# Patient Record
Sex: Female | Born: 1950 | Race: White | Hispanic: No | Smoking: Former smoker
Health system: Southern US, Community
[De-identification: ages and names within clinical notes are randomized; demographics above are authoritative.]

## PROBLEM LIST (undated history)

## (undated) DIAGNOSIS — K219 Gastro-esophageal reflux disease without esophagitis: Secondary | ICD-10-CM

## (undated) DIAGNOSIS — J42 Unspecified chronic bronchitis: Secondary | ICD-10-CM

## (undated) DIAGNOSIS — M797 Fibromyalgia: Secondary | ICD-10-CM

## (undated) DIAGNOSIS — J9610 Chronic respiratory failure, unspecified whether with hypoxia or hypercapnia: Secondary | ICD-10-CM

## (undated) DIAGNOSIS — J45909 Unspecified asthma, uncomplicated: Secondary | ICD-10-CM

## (undated) DIAGNOSIS — M1990 Unspecified osteoarthritis, unspecified site: Secondary | ICD-10-CM

## (undated) DIAGNOSIS — D638 Anemia in other chronic diseases classified elsewhere: Secondary | ICD-10-CM

## (undated) DIAGNOSIS — G8929 Other chronic pain: Secondary | ICD-10-CM

## (undated) DIAGNOSIS — J939 Pneumothorax, unspecified: Secondary | ICD-10-CM

## (undated) DIAGNOSIS — Z9981 Dependence on supplemental oxygen: Secondary | ICD-10-CM

## (undated) DIAGNOSIS — Z201 Contact with and (suspected) exposure to tuberculosis: Secondary | ICD-10-CM

## (undated) DIAGNOSIS — I10 Essential (primary) hypertension: Secondary | ICD-10-CM

## (undated) DIAGNOSIS — I4892 Unspecified atrial flutter: Secondary | ICD-10-CM

## (undated) DIAGNOSIS — J449 Chronic obstructive pulmonary disease, unspecified: Secondary | ICD-10-CM

## (undated) DIAGNOSIS — R519 Headache, unspecified: Secondary | ICD-10-CM

## (undated) DIAGNOSIS — E875 Hyperkalemia: Secondary | ICD-10-CM

## (undated) DIAGNOSIS — J189 Pneumonia, unspecified organism: Secondary | ICD-10-CM

## (undated) DIAGNOSIS — C259 Malignant neoplasm of pancreas, unspecified: Secondary | ICD-10-CM

## (undated) DIAGNOSIS — M25551 Pain in right hip: Secondary | ICD-10-CM

## (undated) DIAGNOSIS — I480 Paroxysmal atrial fibrillation: Secondary | ICD-10-CM

## (undated) DIAGNOSIS — R51 Headache: Secondary | ICD-10-CM

## (undated) DIAGNOSIS — R011 Cardiac murmur, unspecified: Secondary | ICD-10-CM

## (undated) DIAGNOSIS — I82409 Acute embolism and thrombosis of unspecified deep veins of unspecified lower extremity: Secondary | ICD-10-CM

## (undated) HISTORY — DX: Anemia in other chronic diseases classified elsewhere: D63.8

## (undated) HISTORY — PX: TUBAL LIGATION: SHX77

## (undated) HISTORY — DX: Malignant neoplasm of pancreas, unspecified: C25.9

## (undated) HISTORY — DX: Paroxysmal atrial fibrillation: I48.0

## (undated) HISTORY — PX: TONSILLECTOMY: SUR1361

## (undated) HISTORY — DX: Unspecified atrial flutter: I48.92

## (undated) HISTORY — PX: ANTERIOR CERVICAL DECOMP/DISCECTOMY FUSION: SHX1161

## (undated) HISTORY — DX: Chronic respiratory failure, unspecified whether with hypoxia or hypercapnia: J96.10

## (undated) HISTORY — DX: Hyperkalemia: E87.5

## (undated) HISTORY — DX: Acute embolism and thrombosis of unspecified deep veins of unspecified lower extremity: I82.409

---

## 2000-03-25 ENCOUNTER — Other Ambulatory Visit: Admission: RE | Admit: 2000-03-25 | Discharge: 2000-03-25 | Payer: Self-pay | Admitting: Obstetrics and Gynecology

## 2000-05-05 ENCOUNTER — Encounter: Admission: RE | Admit: 2000-05-05 | Discharge: 2000-05-05 | Payer: Self-pay | Admitting: *Deleted

## 2000-05-05 ENCOUNTER — Encounter: Payer: Self-pay | Admitting: *Deleted

## 2000-09-15 ENCOUNTER — Emergency Department (HOSPITAL_COMMUNITY): Admission: EM | Admit: 2000-09-15 | Discharge: 2000-09-15 | Payer: Self-pay | Admitting: Emergency Medicine

## 2000-11-16 ENCOUNTER — Encounter: Payer: Self-pay | Admitting: Neurosurgery

## 2000-11-16 ENCOUNTER — Ambulatory Visit (HOSPITAL_COMMUNITY): Admission: RE | Admit: 2000-11-16 | Discharge: 2000-11-16 | Payer: Self-pay | Admitting: Neurosurgery

## 2001-05-27 ENCOUNTER — Encounter: Payer: Self-pay | Admitting: Neurosurgery

## 2001-05-31 ENCOUNTER — Encounter: Payer: Self-pay | Admitting: Neurosurgery

## 2001-05-31 ENCOUNTER — Observation Stay (HOSPITAL_COMMUNITY): Admission: RE | Admit: 2001-05-31 | Discharge: 2001-06-01 | Payer: Self-pay | Admitting: Neurosurgery

## 2001-06-29 ENCOUNTER — Other Ambulatory Visit: Admission: RE | Admit: 2001-06-29 | Discharge: 2001-06-29 | Payer: Self-pay | Admitting: Obstetrics and Gynecology

## 2001-08-09 ENCOUNTER — Encounter: Payer: Self-pay | Admitting: Neurosurgery

## 2001-08-09 ENCOUNTER — Ambulatory Visit (HOSPITAL_COMMUNITY): Admission: RE | Admit: 2001-08-09 | Discharge: 2001-08-09 | Payer: Self-pay | Admitting: Neurosurgery

## 2002-08-16 ENCOUNTER — Encounter: Payer: Self-pay | Admitting: Internal Medicine

## 2002-08-16 ENCOUNTER — Ambulatory Visit (HOSPITAL_COMMUNITY): Admission: RE | Admit: 2002-08-16 | Discharge: 2002-08-16 | Payer: Self-pay | Admitting: General Surgery

## 2002-08-28 ENCOUNTER — Emergency Department (HOSPITAL_COMMUNITY): Admission: EM | Admit: 2002-08-28 | Discharge: 2002-08-28 | Payer: Self-pay | Admitting: Emergency Medicine

## 2002-12-21 ENCOUNTER — Emergency Department (HOSPITAL_COMMUNITY): Admission: EM | Admit: 2002-12-21 | Discharge: 2002-12-21 | Payer: Self-pay | Admitting: Emergency Medicine

## 2003-02-10 ENCOUNTER — Other Ambulatory Visit: Admission: RE | Admit: 2003-02-10 | Discharge: 2003-02-10 | Payer: Self-pay | Admitting: Ophthalmology

## 2003-05-03 ENCOUNTER — Ambulatory Visit (HOSPITAL_COMMUNITY): Admission: RE | Admit: 2003-05-03 | Discharge: 2003-05-03 | Payer: Self-pay | Admitting: Preventative Medicine

## 2004-06-22 ENCOUNTER — Emergency Department (HOSPITAL_COMMUNITY): Admission: EM | Admit: 2004-06-22 | Discharge: 2004-06-22 | Payer: Self-pay | Admitting: Emergency Medicine

## 2004-08-03 HISTORY — PX: CARDIAC CATHETERIZATION: SHX172

## 2004-08-12 ENCOUNTER — Ambulatory Visit (HOSPITAL_COMMUNITY): Admission: RE | Admit: 2004-08-12 | Discharge: 2004-08-12 | Payer: Self-pay | Admitting: *Deleted

## 2004-08-19 ENCOUNTER — Ambulatory Visit (HOSPITAL_COMMUNITY): Admission: RE | Admit: 2004-08-19 | Discharge: 2004-08-19 | Payer: Self-pay | Admitting: *Deleted

## 2004-09-12 ENCOUNTER — Ambulatory Visit (HOSPITAL_COMMUNITY): Admission: RE | Admit: 2004-09-12 | Discharge: 2004-09-12 | Payer: Self-pay | Admitting: Family Medicine

## 2004-10-25 ENCOUNTER — Emergency Department (HOSPITAL_COMMUNITY): Admission: EM | Admit: 2004-10-25 | Discharge: 2004-10-25 | Payer: Self-pay | Admitting: Emergency Medicine

## 2005-03-17 ENCOUNTER — Ambulatory Visit: Payer: Self-pay | Admitting: Family Medicine

## 2005-03-18 ENCOUNTER — Ambulatory Visit: Payer: Self-pay | Admitting: Family Medicine

## 2005-06-04 ENCOUNTER — Encounter: Payer: Self-pay | Admitting: Neurosurgery

## 2005-11-04 ENCOUNTER — Emergency Department (HOSPITAL_COMMUNITY): Admission: EM | Admit: 2005-11-04 | Discharge: 2005-11-04 | Payer: Self-pay | Admitting: Emergency Medicine

## 2007-03-29 ENCOUNTER — Ambulatory Visit (HOSPITAL_COMMUNITY): Admission: RE | Admit: 2007-03-29 | Discharge: 2007-03-29 | Payer: Self-pay | Admitting: Family Medicine

## 2007-09-25 ENCOUNTER — Inpatient Hospital Stay (HOSPITAL_COMMUNITY): Admission: EM | Admit: 2007-09-25 | Discharge: 2007-09-30 | Payer: Self-pay | Admitting: Emergency Medicine

## 2009-09-17 ENCOUNTER — Emergency Department (HOSPITAL_COMMUNITY): Admission: EM | Admit: 2009-09-17 | Discharge: 2009-09-17 | Payer: Self-pay | Admitting: Emergency Medicine

## 2010-06-18 NOTE — H&P (Signed)
NAME:  Lindsay Salazar, Lindsay Salazar NO.:  [id number]   MEDICAL RECORD NO.:  [id number]          PATIENT TYPE:  INP   LOCATION:  A309                          FACILITY:  [facility]   PHYSICIAN:  Melvyn Novas, MDDATE OF BIRTH:  [birth year]   DATE OF ADMISSION:  09/25/2007  DATE OF DISCHARGE:  LH                              HISTORY & PHYSICAL   The patient is a 60 year old white female, pack and half a day smoker,  who presents with an increased complaint of pleuritic-type back pain,  who was seen and found to be in moderate respiratory distress due to  bronchospasm.  Chest x-ray revealed patchy infiltrate in the left lobe.  She subsequently was admitted for steroids nebulizer therapy, smoking  cessation, as well as intravenous Avelox, and pulmonary toilet.  She  denies hemoptysis, anginal chest pain, orthopnea, or PND.  She denies  rigors or chills.   PAST MEDICAL HISTORY:  1. COPD.  2. Asthmatic bronchitis.  3. Hypertension.  4. Fibromyalgia.  5. Hyperlipidemia.   PAST SURGICAL HISTORY:  1. Bilateral tubal ligation.  2. Right knee arthroscopic procedure.   SOCIAL HISTORY:  She smokes a pack and half a day, is married, lives at  home.   CURRENT MEDICATIONS:  1. Plendil 10 mg p.o. daily.  2. Lexapro 10 mg p.o. daily.  3. Lipitor 10 mg p.o. daily.  4. Ativan 1 mg p.o. nightly p.r.n.  5. Meloxicam 10 mg p.o. daily.  6. Tylox 5/500 p.o. b.i.d. p.r.n. for fibromyalgia by Dr. Gerilyn Pilgrim.   PHYSICAL EXAMINATION:  VITAL SIGNS:  Blood pressure is 143/85, pulse is  84 and regular, temperature is 97 orally, and respiratory rate is 22.  EYES:  PERRLA.  Extraocular movements intact.  Sclerae clear.  Conjunctivae pink.  NECK:  No JVD, no carotid bruits, no thyromegaly, no thyroid bruits.  LUNGS:  Prolonged inspiratory and expiratory phase.  Mild inspiratory  and expiratory wheeze, scattered rhonchi, and no rales appreciable.  HEART:  Regular rhythm.  No murmurs, gallops,  heaves, thrills, or rubs.  ABDOMEN:  Soft and nontender.  Bowel sounds are normoactive.  No  guarding, rebound, mass, or splenomegaly.  EXTREMITIES:  No clubbing, cyanosis, or edema.  NEUROLOGIC:  Cranial nerves II through XII are grossly intact.  The  patient moves all 4 extremities.   IMPRESSION:  1. Lung infiltrate.  2. Chronic obstructive pulmonary disease exacerbation.  3. Hypertension.  4. Hyperlipidemia.  5. Chronic pain, fibromyalgia.   PLAN:  To admit through IV Avelox, Solu-Medrol 125 q.8 h. intravenous  fluids, DuoNeb nebulizer q.4 h. while awake, and I will make further  recommendations as the database expands.      Melvyn Novas, MD  Electronically Signed     RMD/MEDQ  D:  09/25/2007  T:  09/26/2007  Job:  [id number]

## 2010-06-21 NOTE — Cardiovascular Report (Signed)
[name], WELLNITZ NO.:  [id number]   MEDICAL RECORD NO.:  [id number]          PATIENT TYPE:  OIB   LOCATION:  2854                         FACILITY:  [facility]   PHYSICIAN:  Darlin Priestly, MD  DATE OF BIRTH:  January 16, 1951   DATE OF PROCEDURE:  DATE OF DISCHARGE:                              CARDIAC CATHETERIZATION   PROCEDURES:  1.  Left heart catheterization.  2.  Coronary angiography.  3.  Left ventriculogram.  4.  Abdominal aortogram.  5.  Bilateral renal angiograms.   ATTENDING PHYSICIAN:  Darlin Priestly, M.D.   COMPLICATIONS:  None.   INDICATIONS:  Ms. Contino is a 60 year old female patient of Dr. Della Goo, Dr. Kem Boroughs with a history of hypertension, chronic  obstructive pulmonary disease, depression, fibromyalgia, recurrent chest  pain.  She has had negative stress testing for ischemia though she does  continue to complain of ongoing chest pain.  She is now referred for cardiac  catheterization to rule out significant coronary artery disease.   DESCRIPTION OF OPERATION:  After obtaining informed written consent, the  patient was brought to the cardiac catheterization laboratory.  Right and  left groin were  shaved and prepped and draped in the usual sterile fashion.  ECG monitor was established.  Using the modified Seldinger technique, a 6  French arterial sheath was inserted in the right femoral artery. A 6 French  diagnostic catheter was then used to performed diagnostic angiography.  Left  main is a large, short vessel with no significant disease.   The LAD is a medium size vessel which courses to the apex.  There is two  small diagonal branches.  The LAD has no significant disease.   The first and second diagonal branches were small vessel with no significant  disease.   Left circumflex is a medium size vessel which courses in the AV groove and  gives rise to three obtuse marginal branches.  The AV groove circumflex has  no significant disease.   The first and third OMs are small vessels.  The second OM is a medium size  vessel which bifurcates distally with no significant disease.   The right coronary artery is a medium size vessel which is dominant.  It  gives rise to both PDA as well as posterior lateral branch.  There is no  specific disease in the RCA, PDA, and posterior lateral branch.   LEFT VENTRICULOGRAM:  Left ventriculogram reveals preserved EF of 60%.  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   ABDOMINAL AORTOGRAM:  Reveals questionable left renal artery stenosis with  right renal fibromuscular dysplasia.   Bilateral selective renal angiograms reveal widely patent left renal artery.  The right renal artery appears to have 40-50% mid renal artery fibromuscular  dysplasia.   HEMODYNAMICS:  Systemic arterial pressure 185/98, LV pressure 176/13, LVEDP  of 26.   CONCLUSIONS:  1.  No significant coronary artery disease.  2.  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  3.  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  4.  Systemic hypertension.  5.  Elevated left ventricular end-diastolic pressure.       RHM/MEDQ  D:  08/19/2004  T:  08/19/2004  Job:  [id number]   cc:   Della Goo, M.D.  [address] [place]  [place] [address]  Fax: [phone]   Dani Gobble, MD  Fax: [phone]

## 2010-06-21 NOTE — Discharge Summary (Signed)
NAME:  Lindsay Salazar, Lindsay Salazar NO.:  [id number]   MEDICAL RECORD NO.:  [id number]          PATIENT TYPE:  INP   LOCATION:  A309                          FACILITY:  [facility]   PHYSICIAN:  Melvyn Novas, MDDATE OF BIRTH:  1950-07-28   DATE OF ADMISSION:  09/25/2007  DATE OF DISCHARGE:  08/27/2009LH                               DISCHARGE SUMMARY   The patient is a 59 year old white female with history of half-pack-a-  day smoking, presented to the ER with pleuritic-type chest pain,  increasing cough, dyspnea, and found to have moderate respiratory  distress due to bronchospasm.  She was subsequently admitted, found to  have right lower lobe infiltrate.  She was placed on IV Solu-Medrol 125  mg q.8 h., IV Avelox 400 mg daily, and DuoNeb nebulizer q.4 h. while  awake.  The patient subsequently improved over a 3- to 4-day period, was  hemodynamically stable.  There was no evidence of ischemia, uncontrolled  hypertension, or other medical sequelae.  She was subsequently  discharged on Avelox 400 mg daily, Sterapred dose pack 10 mg 6 days  taper, Plendil 10 mg p.o. daily, Lexapro 10 mg daily, Lipitor 10 mg  daily, and Relafen 750 mg p.o. daily.  She will follow up in the office  in 3 days' time.      Melvyn Novas, MD  Electronically Signed     RMD/MEDQ  D:  12/24/2007  T:  12/25/2007  Job:  [id number]

## 2010-11-27 ENCOUNTER — Emergency Department (HOSPITAL_COMMUNITY)
Admission: EM | Admit: 2010-11-27 | Discharge: 2010-11-27 | Disposition: A | Discharge status: Home / Self Care | Payer: Medicaid Other | Attending: Emergency Medicine | Admitting: Emergency Medicine

## 2010-11-27 ENCOUNTER — Encounter: Payer: Self-pay | Admitting: Emergency Medicine

## 2010-11-27 DIAGNOSIS — J4489 Other specified chronic obstructive pulmonary disease: Secondary | ICD-10-CM | POA: Insufficient documentation

## 2010-11-27 DIAGNOSIS — K089 Disorder of teeth and supporting structures, unspecified: Secondary | ICD-10-CM | POA: Insufficient documentation

## 2010-11-27 DIAGNOSIS — F17200 Nicotine dependence, unspecified, uncomplicated: Secondary | ICD-10-CM | POA: Insufficient documentation

## 2010-11-27 DIAGNOSIS — I10 Essential (primary) hypertension: Secondary | ICD-10-CM | POA: Insufficient documentation

## 2010-11-27 DIAGNOSIS — K0889 Other specified disorders of teeth and supporting structures: Secondary | ICD-10-CM

## 2010-11-27 DIAGNOSIS — K029 Dental caries, unspecified: Secondary | ICD-10-CM | POA: Insufficient documentation

## 2010-11-27 DIAGNOSIS — J449 Chronic obstructive pulmonary disease, unspecified: Secondary | ICD-10-CM | POA: Insufficient documentation

## 2010-11-27 DIAGNOSIS — IMO0001 Reserved for inherently not codable concepts without codable children: Secondary | ICD-10-CM | POA: Insufficient documentation

## 2010-11-27 HISTORY — DX: Chronic obstructive pulmonary disease, unspecified: J44.9

## 2010-11-27 HISTORY — DX: Essential (primary) hypertension: I10

## 2010-11-27 HISTORY — DX: Fibromyalgia: M79.7

## 2010-11-27 MED ORDER — HYDROCODONE-ACETAMINOPHEN 5-325 MG PO TABS: 1.0000 | ORAL_TABLET | Freq: Four times a day (QID) | ORAL | Status: AC | PRN

## 2010-11-27 MED ORDER — OXYCODONE-ACETAMINOPHEN 5-325 MG PO TABS
1.0000 | ORAL_TABLET | Freq: Once | ORAL | Status: AC
  Administered 2010-11-27: 1 via ORAL
  Filled 2010-11-27: qty 1

## 2010-11-27 MED ORDER — CLINDAMYCIN HCL 150 MG PO CAPS: ORAL_CAPSULE | ORAL | Status: DC

## 2010-11-27 MED ORDER — CLINDAMYCIN HCL 150 MG PO CAPS
300.0000 mg | ORAL_CAPSULE | Freq: Once | ORAL | Status: AC
  Administered 2010-11-27: 300 mg via ORAL
  Filled 2010-11-27: qty 2

## 2010-11-27 NOTE — ED Provider Notes (Signed)
History     CSN: [id number] Arrival date & time: 11/27/2010  1:56 PM   First MD Initiated Contact with Patient 11/27/10 1411      Chief Complaint  Patient presents with  . Dental Pain    (Consider location/radiation/quality/duration/timing/severity/associated sxs/prior treatment) HPI Comments: Plans to see oral surgeon in [place] to have remaining teeth extracted.  Patient is a 60 y.o. female presenting with tooth pain. The history is provided by the patient. No language interpreter was used.  Dental PainThe primary symptoms include mouth pain. Episode onset: several days ago. The symptoms are worsening. The symptoms occur constantly.  Additional symptoms include: dental sensitivity to temperature.    Past Medical History  Diagnosis Date  . COPD (chronic obstructive pulmonary disease)   . Fibromyalgia   . Hypertension     Past Surgical History  Procedure Date  . Neck surgery   . Tonsillectomy   . Tubal ligation     History reviewed. No pertinent family history.  History  Substance Use Topics  . Smoking status: Current Everyday Smoker  . Smokeless tobacco: Not on file  . Alcohol Use: No    OB History    Grav Para Term Preterm Abortions TAB SAB Ect Mult Living                  Review of Systems  HENT: Positive for dental problem.   All other systems reviewed and are negative.    Allergies  Penicillins; Tiotropium bromide monohydrate; and Vicodin  Home Medications   Current Outpatient Rx  Name Route Sig Dispense Refill  . ALBUTEROL SULFATE HFA 108 (90 BASE) MCG/ACT IN AERS Inhalation Inhale 2 puffs into the lungs every 4 (four) hours as needed. For shortness of breath     . IPRATROPIUM-ALBUTEROL 18-103 MCG/ACT IN AERO Inhalation Inhale 2 puffs into the lungs every 6 (six) hours as needed. For shortness of breath     . ATORVASTATIN CALCIUM 20 MG PO TABS Oral Take 20 mg by mouth daily.      Marland Kitchen CARVEDILOL 3.125 MG PO TABS Oral Take 3.125 mg by mouth 2 (two)  times daily with a meal.      . ESCITALOPRAM OXALATE 10 MG PO TABS Oral Take 10 mg by mouth daily.      Marland Kitchen FELODIPINE 10 MG PO TB24 Oral Take 10 mg by mouth daily.      Marland Kitchen LOSARTAN POTASSIUM 100 MG PO TABS Oral Take 100 mg by mouth daily.      Carma Leaven M PLUS PO TABS Oral Take 1 tablet by mouth daily.        BP 124/43  Pulse 64  Temp(Src) 97.9 F (36.6 C) (Oral)  Resp 19  SpO2 99%  Physical Exam  Nursing note and vitals reviewed. Constitutional: She is oriented to person, place, and time. She appears well-developed and well-nourished. No distress.  HENT:  Head: Normocephalic and atraumatic.  Mouth/Throat: Mucous membranes are normal. She does not have dentures. Abnormal dentition. Dental caries present. No dental abscesses or uvula swelling.    Eyes: EOM are normal.  Neck: Normal range of motion.  Cardiovascular: Normal rate, regular rhythm and normal heart sounds.   Pulmonary/Chest: Effort normal and breath sounds normal.  Abdominal: Soft. She exhibits no distension. There is no tenderness.  Musculoskeletal: Normal range of motion.  Neurological: She is alert and oriented to person, place, and time.  Skin: Skin is warm and dry.  Psychiatric: She has a normal mood and  affect. Judgment normal.    ED Course  Procedures (including critical care time)  Labs Reviewed - No data to display No results found.   No diagnosis found.    MDM          Worthy Rancher, PA 11/27/10 [phone]

## 2010-11-27 NOTE — ED Notes (Signed)
Toothaches x 1 month. To upper frontal to r side.  And pain to lower also. Nad. No swelling noted.

## 2010-11-29 NOTE — ED Provider Notes (Signed)
Medical screening examination/treatment/procedure(s) were performed by non-physician practitioner and as supervising physician I was immediately available for consultation/collaboration.   Jotham Ahn L Margan Elias, MD 11/29/10 0903 

## 2011-05-27 ENCOUNTER — Encounter (HOSPITAL_COMMUNITY): Payer: Self-pay | Admitting: *Deleted

## 2011-05-27 ENCOUNTER — Emergency Department (HOSPITAL_COMMUNITY)
Admission: EM | Admit: 2011-05-27 | Discharge: 2011-05-27 | Disposition: A | Discharge status: Home / Self Care | Payer: Medicaid Other | Attending: Emergency Medicine | Admitting: Emergency Medicine

## 2011-05-27 DIAGNOSIS — F17200 Nicotine dependence, unspecified, uncomplicated: Secondary | ICD-10-CM | POA: Insufficient documentation

## 2011-05-27 DIAGNOSIS — I10 Essential (primary) hypertension: Secondary | ICD-10-CM | POA: Insufficient documentation

## 2011-05-27 DIAGNOSIS — J449 Chronic obstructive pulmonary disease, unspecified: Secondary | ICD-10-CM | POA: Insufficient documentation

## 2011-05-27 DIAGNOSIS — J4489 Other specified chronic obstructive pulmonary disease: Secondary | ICD-10-CM | POA: Insufficient documentation

## 2011-05-27 DIAGNOSIS — Z79899 Other long term (current) drug therapy: Secondary | ICD-10-CM | POA: Insufficient documentation

## 2011-05-27 DIAGNOSIS — K0889 Other specified disorders of teeth and supporting structures: Secondary | ICD-10-CM

## 2011-05-27 DIAGNOSIS — K029 Dental caries, unspecified: Secondary | ICD-10-CM | POA: Insufficient documentation

## 2011-05-27 DIAGNOSIS — IMO0001 Reserved for inherently not codable concepts without codable children: Secondary | ICD-10-CM | POA: Insufficient documentation

## 2011-05-27 MED ORDER — HYDROCODONE-ACETAMINOPHEN 5-325 MG PO TABS
1.0000 | ORAL_TABLET | Freq: Once | ORAL | Status: AC
  Administered 2011-05-27: 1 via ORAL
  Filled 2011-05-27: qty 1

## 2011-05-27 MED ORDER — CLINDAMYCIN HCL 150 MG PO CAPS: 150.0000 mg | ORAL_CAPSULE | Freq: Four times a day (QID) | ORAL | Status: AC

## 2011-05-27 MED ORDER — HYDROCODONE-ACETAMINOPHEN 5-325 MG PO TABS: 1.0000 | ORAL_TABLET | ORAL | Status: AC | PRN

## 2011-05-27 NOTE — Discharge Instructions (Signed)
Dental Pain A tooth ache may be caused by cavities (tooth decay). Cavities expose the nerve of the tooth to air and hot or cold temperatures. It may come from an infection or abscess (also called a boil or furuncle) around your tooth. It is also often caused by dental caries (tooth decay). This causes the pain you are having. DIAGNOSIS  Your caregiver can diagnose this problem by exam. TREATMENT   If caused by an infection, it may be treated with medications which kill germs (antibiotics) and pain medications as prescribed by your caregiver. Take medications as directed.   Only take over-the-counter or prescription medicines for pain, discomfort, or fever as directed by your caregiver.   Whether the tooth ache today is caused by infection or dental disease, you should see your dentist as soon as possible for further care.  SEEK MEDICAL CARE IF: The exam and treatment you received today has been provided on an emergency basis only. This is not a substitute for complete medical or dental care. If your problem worsens or new problems (symptoms) appear, and you are unable to meet with your dentist, call or return to this location. SEEK IMMEDIATE MEDICAL CARE IF:   You have a fever.   You develop redness and swelling of your face, jaw, or neck.   You are unable to open your mouth.   You have severe pain uncontrolled by pain medicine.  MAKE SURE YOU:   Understand these instructions.   Will watch your condition.   Will get help right away if you are not doing well or get worse.  Document Released: 01/20/2005 Document Revised: 01/09/2011 Document Reviewed: 09/08/2007 [employer] Patient Information 2012 [employer].   You may take the hydrocodone prescribed for pain relief.  This will make you drowsy - do not drive within 4 hours of taking this medication.  

## 2011-05-27 NOTE — ED Notes (Signed)
Dental pain right jaw 

## 2011-05-27 NOTE — ED Provider Notes (Signed)
History     CSN: [id number]  Arrival date & time 05/27/11  1947   First MD Initiated Contact with Patient 05/27/11 1954      Chief Complaint  Patient presents with  . Dental Pain    (Consider location/radiation/quality/duration/timing/severity/associated sxs/prior treatment) HPI Comments: Lindsay Salazar is here for treatment of right lower jaw and tooth pain which has been worse over the past 24 hours.  She describes severe lancing pain after biting into a pork chop tonight.  The pain is constant.  She has tried no medications for relief of her symptoms. She has also noticed purulent drainage coming from around one of her lower teeth on the side.  She denies fevers or chills, no swelling in her cheek, denies headaches earache.   Patient is a 61 y.o. female presenting with tooth pain. The history is provided by the patient.  Dental PainPrimary symptoms do not include headaches, fever or sore throat.  Additional symptoms do not include: facial swelling.    Past Medical History  Diagnosis Date  . COPD (chronic obstructive pulmonary disease)   . Fibromyalgia   . Hypertension     Past Surgical History  Procedure Date  . Neck surgery   . Tonsillectomy   . Tubal ligation     No family history on file.  History  Substance Use Topics  . Smoking status: Current Everyday Smoker  . Smokeless tobacco: Not on file  . Alcohol Use: No    OB History    Grav Para Term Preterm Abortions TAB SAB Ect Mult Living                  Review of Systems  Constitutional: Negative for fever.  HENT: Positive for dental problem. Negative for congestion, sore throat, facial swelling, neck pain and neck stiffness.   Cardiovascular: Negative for chest pain.  Gastrointestinal: Negative for nausea.  Musculoskeletal: Negative for joint swelling.  Skin: Negative for color change and rash.  Neurological: Negative for headaches.    Allergies  Penicillins; Tiotropium bromide monohydrate; and  Vicodin  Home Medications   Current Outpatient Rx  Name Route Sig Dispense Refill  . ALBUTEROL SULFATE HFA 108 (90 BASE) MCG/ACT IN AERS Inhalation Inhale 2 puffs into the lungs every 4 (four) hours as needed. For shortness of breath     . IPRATROPIUM-ALBUTEROL 18-103 MCG/ACT IN AERO Inhalation Inhale 2 puffs into the lungs every 6 (six) hours as needed. For shortness of breath     . ATORVASTATIN CALCIUM 20 MG PO TABS Oral Take 20 mg by mouth daily.      Marland Kitchen CARVEDILOL 3.125 MG PO TABS Oral Take 3.125 mg by mouth 2 (two) times daily with a meal.      . CLINDAMYCIN HCL 150 MG PO CAPS  Two po TID 60 capsule 0  . CLINDAMYCIN HCL 150 MG PO CAPS Oral Take 1 capsule (150 mg total) by mouth every 6 (six) hours. 28 capsule 0  . ESCITALOPRAM OXALATE 10 MG PO TABS Oral Take 10 mg by mouth daily.      Marland Kitchen FELODIPINE ER 10 MG PO TB24 Oral Take 10 mg by mouth daily.      Marland Kitchen HYDROCODONE-ACETAMINOPHEN 5-325 MG PO TABS Oral Take 1 tablet by mouth every 4 (four) hours as needed for pain. 15 tablet 0  . LOSARTAN POTASSIUM 100 MG PO TABS Oral Take 100 mg by mouth daily.      Carma Leaven M PLUS PO TABS Oral Take  1 tablet by mouth daily.        BP 118/64  Pulse 82  Temp(Src) 97.7 F (36.5 C) (Oral)  Resp 18  Ht 5\' 4"  (1.626 m)  Wt 136 lb (61.689 kg)  BMI 23.34 kg/m2  SpO2 99%  Physical Exam  Constitutional: She is oriented to person, place, and time. She appears well-developed and well-nourished. No distress.  HENT:  Head: Normocephalic and atraumatic.  Right Ear: Tympanic membrane and external ear normal.  Left Ear: Tympanic membrane and external ear normal.  Mouth/Throat: Oropharynx is clear and moist and mucous membranes are normal. No oral lesions. Dental caries present. No uvula swelling.         Pain and slight edema of the gingiva in right lower jawline, partially edentulous on this side with several molars which have broken off at the gumline.  No fluctuance or induration or other sign of active  abscess.  Eyes: Conjunctivae are normal.  Neck: Normal range of motion. Neck supple.  Cardiovascular: Normal rate and normal heart sounds.   Pulmonary/Chest: Effort normal.  Abdominal: She exhibits no distension.  Musculoskeletal: Normal range of motion.  Lymphadenopathy:    She has no cervical adenopathy.  Neurological: She is alert and oriented to person, place, and time.  Skin: Skin is warm and dry. No erythema.  Psychiatric: She has a normal mood and affect.    ED Course  Procedures (including critical care time)  Labs Reviewed - No data to display No results found.   1. Pain, dental       MDM  Probable early abscess dentition along with poor dentition and multiple caries and fractured teeth.  Patient was prescribed hydrocodone and clindamycin.  Also given dental referral list seen.       Candis Musa, PA 05/27/11 2040

## 2011-05-31 NOTE — ED Provider Notes (Signed)
Medical screening examination/treatment/procedure(s) were performed by non-physician practitioner and as supervising physician I was immediately available for consultation/collaboration.   Shelda Jakes, MD 05/31/11 [phone]

## 2011-07-02 ENCOUNTER — Other Ambulatory Visit (HOSPITAL_COMMUNITY): Payer: Self-pay | Admitting: Internal Medicine

## 2011-07-02 DIAGNOSIS — Z139 Encounter for screening, unspecified: Secondary | ICD-10-CM

## 2011-07-08 ENCOUNTER — Ambulatory Visit (HOSPITAL_COMMUNITY)
Admission: RE | Admit: 2011-07-08 | Discharge: 2011-07-08 | Disposition: A | Discharge status: Home / Self Care | Payer: Medicaid Other | Source: Ambulatory Visit | Attending: Internal Medicine | Admitting: Internal Medicine

## 2011-07-08 DIAGNOSIS — Z1231 Encounter for screening mammogram for malignant neoplasm of breast: Secondary | ICD-10-CM | POA: Insufficient documentation

## 2011-07-08 DIAGNOSIS — Z139 Encounter for screening, unspecified: Secondary | ICD-10-CM

## 2011-08-27 ENCOUNTER — Emergency Department (HOSPITAL_COMMUNITY)
Admission: EM | Admit: 2011-08-27 | Discharge: 2011-08-27 | Disposition: A | Discharge status: Home / Self Care | Payer: Medicaid Other | Attending: Emergency Medicine | Admitting: Emergency Medicine

## 2011-08-27 ENCOUNTER — Encounter (HOSPITAL_COMMUNITY): Payer: Self-pay | Admitting: *Deleted

## 2011-08-27 DIAGNOSIS — J4489 Other specified chronic obstructive pulmonary disease: Secondary | ICD-10-CM | POA: Insufficient documentation

## 2011-08-27 DIAGNOSIS — IMO0001 Reserved for inherently not codable concepts without codable children: Secondary | ICD-10-CM | POA: Insufficient documentation

## 2011-08-27 DIAGNOSIS — F17200 Nicotine dependence, unspecified, uncomplicated: Secondary | ICD-10-CM | POA: Insufficient documentation

## 2011-08-27 DIAGNOSIS — I10 Essential (primary) hypertension: Secondary | ICD-10-CM | POA: Insufficient documentation

## 2011-08-27 DIAGNOSIS — K047 Periapical abscess without sinus: Secondary | ICD-10-CM | POA: Insufficient documentation

## 2011-08-27 DIAGNOSIS — J449 Chronic obstructive pulmonary disease, unspecified: Secondary | ICD-10-CM | POA: Insufficient documentation

## 2011-08-27 MED ORDER — OXYCODONE-ACETAMINOPHEN 5-325 MG PO TABS: 1.0000 | ORAL_TABLET | Freq: Four times a day (QID) | ORAL | Status: AC | PRN

## 2011-08-27 MED ORDER — OXYCODONE-ACETAMINOPHEN 5-325 MG PO TABS
1.0000 | ORAL_TABLET | Freq: Once | ORAL | Status: AC
  Administered 2011-08-27: 1 via ORAL
  Filled 2011-08-27: qty 1

## 2011-08-27 MED ORDER — CLINDAMYCIN HCL 300 MG PO CAPS: ORAL_CAPSULE | ORAL | Status: DC

## 2011-08-27 NOTE — ED Provider Notes (Signed)
History  This chart was scribed for Benny Lennert, MD by Erskine Emery. This patient was seen in room APA11/APA11 and the patient's care was started at 17:18.   CSN: [id number]  Arrival date & time 08/27/11  1709   First MD Initiated Contact with Patient 08/27/11 1718      Chief Complaint  Patient presents with  . Dental Pain    (Consider location/radiation/quality/duration/timing/severity/associated sxs/prior Treatment) Lindsay Salazar is a 61 y.o. female who presents to the Emergency Department. Patient is a 61 y.o. female presenting with tooth pain. The history is provided by the patient. No language interpreter was used.  Dental PainThe primary symptoms include mouth pain and oral lesions. Primary symptoms do not include headaches or cough. The symptoms began yesterday. The symptoms are unchanged. The symptoms occur constantly.  Affected locations include: gum(s).  Additional symptoms do not include: fatigue. Medical issues include: smoking.  Pt reports she is allergic to penicillin.   Past Medical History  Diagnosis Date  . COPD (chronic obstructive pulmonary disease)   . Fibromyalgia   . Hypertension     Past Surgical History  Procedure Date  . Neck surgery   . Tonsillectomy   . Tubal ligation     History reviewed. No pertinent family history.  History  Substance Use Topics  . Smoking status: Current Everyday Smoker  . Smokeless tobacco: Not on file  . Alcohol Use: No    OB History    Grav Para Term Preterm Abortions TAB SAB Ect Mult Living                  Review of Systems  Constitutional: Negative for fatigue.  HENT: Positive for mouth sores and dental problem (Bottom right and top right with associated abscess.). Negative for congestion, sinus pressure and ear discharge.   Eyes: Negative for discharge.  Respiratory: Negative for cough.   Cardiovascular: Negative for chest pain.  Gastrointestinal: Negative for abdominal pain and diarrhea.    Genitourinary: Negative for frequency and hematuria.  Musculoskeletal: Negative for back pain.  Skin: Negative for rash.  Neurological: Negative for seizures and headaches.  Hematological: Negative.   Psychiatric/Behavioral: Negative for hallucinations.    Allergies  Penicillins; Tiotropium bromide monohydrate; and Vicodin  Home Medications   Current Outpatient Rx  Name Route Sig Dispense Refill  . ALBUTEROL SULFATE HFA 108 (90 BASE) MCG/ACT IN AERS Inhalation Inhale 2 puffs into the lungs every 4 (four) hours as needed. For shortness of breath     . IPRATROPIUM-ALBUTEROL 18-103 MCG/ACT IN AERO Inhalation Inhale 2 puffs into the lungs every 6 (six) hours as needed. For shortness of breath     . ATORVASTATIN CALCIUM 20 MG PO TABS Oral Take 20 mg by mouth daily.      Marland Kitchen CARVEDILOL 3.125 MG PO TABS Oral Take 3.125 mg by mouth 2 (two) times daily with a meal.      . CLINDAMYCIN HCL 150 MG PO CAPS  Two po TID 60 capsule 0  . ESCITALOPRAM OXALATE 10 MG PO TABS Oral Take 10 mg by mouth daily.      Marland Kitchen FELODIPINE ER 10 MG PO TB24 Oral Take 10 mg by mouth daily.      Marland Kitchen LOSARTAN POTASSIUM 100 MG PO TABS Oral Take 100 mg by mouth daily.      Carma Leaven M PLUS PO TABS Oral Take 1 tablet by mouth daily.        Triage Vitals: BP 153/71  Pulse  79  Temp 98.7 F (37.1 C) (Oral)  Resp 20  Ht 5\' 4"  (1.626 m)  Wt 132 lb (59.875 kg)  BMI 22.66 kg/m2  SpO2 100%  Physical Exam  Constitutional: She is oriented to person, place, and time. She appears well-developed.  HENT:  Head: Normocephalic.  Mouth/Throat: Dental abscesses present.       2 abscesses on right upper and lower gums.  Eyes: Conjunctivae are normal.  Neck: No tracheal deviation present.  Cardiovascular:  No murmur heard. Musculoskeletal: Normal range of motion.  Neurological: She is oriented to person, place, and time.  Skin: Skin is warm.  Psychiatric: She has a normal mood and affect.    ED Course  Procedures (including  critical care time) DIAGNOSTIC STUDIES: Oxygen Saturation is 100% on room air, normal by my interpretation.    COORDINATION OF CARE: 17:22--I evaluated the patient and we discussed a treatment plan including pain medication and anti-biotics to which the pt agreed.  I instructed the pt to follow up with a dentist.    Labs Reviewed - No data to display No results found.   No diagnosis found.    MDM   The chart was scribed for me under my direct supervision.  I personally performed the history, physical, and medical decision making and all procedures in the evaluation of this patient.Benny Lennert, MD 08/27/11 [phone]

## 2011-08-27 NOTE — ED Notes (Signed)
Toothache since yesterday 

## 2011-12-23 ENCOUNTER — Encounter (HOSPITAL_COMMUNITY): Payer: Self-pay | Admitting: *Deleted

## 2011-12-23 ENCOUNTER — Emergency Department (HOSPITAL_COMMUNITY)
Admission: EM | Admit: 2011-12-23 | Discharge: 2011-12-23 | Disposition: A | Discharge status: Home / Self Care | Payer: Medicaid Other | Attending: Emergency Medicine | Admitting: Emergency Medicine

## 2011-12-23 DIAGNOSIS — K0889 Other specified disorders of teeth and supporting structures: Secondary | ICD-10-CM

## 2011-12-23 DIAGNOSIS — F17200 Nicotine dependence, unspecified, uncomplicated: Secondary | ICD-10-CM | POA: Insufficient documentation

## 2011-12-23 DIAGNOSIS — J449 Chronic obstructive pulmonary disease, unspecified: Secondary | ICD-10-CM | POA: Insufficient documentation

## 2011-12-23 DIAGNOSIS — IMO0001 Reserved for inherently not codable concepts without codable children: Secondary | ICD-10-CM | POA: Insufficient documentation

## 2011-12-23 DIAGNOSIS — K089 Disorder of teeth and supporting structures, unspecified: Secondary | ICD-10-CM | POA: Insufficient documentation

## 2011-12-23 DIAGNOSIS — Z79899 Other long term (current) drug therapy: Secondary | ICD-10-CM | POA: Insufficient documentation

## 2011-12-23 DIAGNOSIS — I10 Essential (primary) hypertension: Secondary | ICD-10-CM | POA: Insufficient documentation

## 2011-12-23 DIAGNOSIS — J4489 Other specified chronic obstructive pulmonary disease: Secondary | ICD-10-CM | POA: Insufficient documentation

## 2011-12-23 MED ORDER — CLINDAMYCIN HCL 150 MG PO CAPS: 300.0000 mg | ORAL_CAPSULE | Freq: Three times a day (TID) | ORAL | Status: DC

## 2011-12-23 MED ORDER — TRAMADOL HCL 50 MG PO TABS
50.0000 mg | ORAL_TABLET | Freq: Once | ORAL | Status: DC
  Filled 2011-12-23: qty 1

## 2011-12-23 MED ORDER — TRAMADOL HCL 50 MG PO TABS: 50.0000 mg | ORAL_TABLET | Freq: Four times a day (QID) | ORAL | Status: DC | PRN

## 2011-12-23 MED ORDER — OXYCODONE-ACETAMINOPHEN 5-325 MG PO TABS: ORAL_TABLET | ORAL | Status: DC

## 2011-12-23 MED ORDER — IBUPROFEN 800 MG PO TABS
800.0000 mg | ORAL_TABLET | Freq: Once | ORAL | Status: AC
  Administered 2011-12-23: 800 mg via ORAL
  Filled 2011-12-23: qty 1

## 2011-12-23 MED ORDER — CLINDAMYCIN HCL 150 MG PO CAPS
300.0000 mg | ORAL_CAPSULE | Freq: Once | ORAL | Status: AC
  Administered 2011-12-23: 300 mg via ORAL
  Filled 2011-12-23: qty 2

## 2011-12-23 NOTE — ED Provider Notes (Signed)
History     CSN: [id number]  Arrival date & time 12/23/11  4098   First MD Initiated Contact with Patient 12/23/11 1911      Chief Complaint  Patient presents with  . Dental Pain    (Consider location/radiation/quality/duration/timing/severity/associated sxs/prior treatment) HPI Comments: States she a dental appt last but missed it because of a death in the family.  Can't see her dentist until January.  Patient is a 61 y.o. female presenting with tooth pain. The history is provided by the patient. No language interpreter was used.  Dental PainThe primary symptoms include mouth pain. Primary symptoms do not include dental injury or fever. Episode onset: ~ 2 weeks ago. The symptoms are unchanged. The symptoms occur constantly.  Additional symptoms do not include: gum swelling, purulent gums, facial swelling, drooling and swollen glands. Medical issues include: smoking.    Past Medical History  Diagnosis Date  . COPD (chronic obstructive pulmonary disease)   . Fibromyalgia   . Hypertension     Past Surgical History  Procedure Date  . Neck surgery   . Tonsillectomy   . Tubal ligation     History reviewed. No pertinent family history.  History  Substance Use Topics  . Smoking status: Current Every Day Smoker  . Smokeless tobacco: Not on file  . Alcohol Use: No    OB History    Grav Para Term Preterm Abortions TAB SAB Ect Mult Living                  Review of Systems  Constitutional: Negative for fever and chills.  HENT: Positive for dental problem. Negative for facial swelling and drooling.   All other systems reviewed and are negative.    Allergies  Penicillins; Tiotropium bromide monohydrate; and Vicodin  Home Medications   Current Outpatient Rx  Name  Route  Sig  Dispense  Refill  . ALBUTEROL SULFATE HFA 108 (90 BASE) MCG/ACT IN AERS   Inhalation   Inhale 2 puffs into the lungs every 4 (four) hours as needed. For shortness of breath          .  IPRATROPIUM-ALBUTEROL 18-103 MCG/ACT IN AERO   Inhalation   Inhale 2 puffs into the lungs every 6 (six) hours as needed. For shortness of breath          . CARVEDILOL 3.125 MG PO TABS   Oral   Take 3.125 mg by mouth 2 (two) times daily with a meal.           . ESCITALOPRAM OXALATE 10 MG PO TABS   Oral   Take 10 mg by mouth every evening.          Marland Kitchen FELODIPINE ER 10 MG PO TB24   Oral   Take 10 mg by mouth every evening.          Marland Kitchen LOSARTAN POTASSIUM 100 MG PO TABS   Oral   Take 100 mg by mouth daily.           Marland Kitchen CLINDAMYCIN HCL 150 MG PO CAPS   Oral   Take 2 capsules (300 mg total) by mouth 3 (three) times daily.   60 capsule   0   . TRAMADOL HCL 50 MG PO TABS   Oral   Take 1 tablet (50 mg total) by mouth every 6 (six) hours as needed for pain.   24 tablet   0     BP 133/55  Pulse 94  Temp 97.7 F (  36.5 C) (Oral)  Resp 18  Ht 5\' 4"  (1.626 m)  Wt 130 lb (58.968 kg)  BMI 22.31 kg/m2  SpO2 96%  Physical Exam  Nursing note and vitals reviewed. Constitutional: She is oriented to person, place, and time. She appears well-developed and well-nourished. No distress.  HENT:  Head: Normocephalic and atraumatic.  Mouth/Throat: Uvula is midline and mucous membranes are normal. Abnormal dentition. Dental caries present. No dental abscesses or uvula swelling.    Eyes: EOM are normal.  Neck: Normal range of motion.  Cardiovascular: Normal rate and regular rhythm.   Pulmonary/Chest: Effort normal.  Abdominal: Soft. She exhibits no distension. There is no tenderness.  Musculoskeletal: Normal range of motion.  Neurological: She is alert and oriented to person, place, and time.  Skin: Skin is warm and dry.  Psychiatric: She has a normal mood and affect. Judgment normal.    ED Course  Procedures (including critical care time)  Labs Reviewed - No data to display No results found.   1. Pain, dental       MDM  rx-clindamycin rx-tramadol Ibuprofen F/u  with dentist ASAP.        Evalina Field, [place] 12/23/11 [phone]

## 2011-12-23 NOTE — ED Provider Notes (Signed)
Medical screening examination/treatment/procedure(s) were performed by non-physician practitioner and as supervising physician I was immediately available for consultation/collaboration.   Dione Booze, MD 12/23/11 2019

## 2011-12-23 NOTE — ED Notes (Signed)
Dental pain, tooth broke upper lat incisor.then swallowed the tooth

## 2011-12-23 NOTE — ED Notes (Signed)
Pt not able to take Tramadol, causes nausea and vomiting.

## 2012-03-21 ENCOUNTER — Emergency Department (HOSPITAL_COMMUNITY)
Admission: EM | Admit: 2012-03-21 | Discharge: 2012-03-21 | Disposition: A | Discharge status: Home / Self Care | Payer: Medicaid Other | Attending: Emergency Medicine | Admitting: Emergency Medicine

## 2012-03-21 ENCOUNTER — Encounter (HOSPITAL_COMMUNITY): Payer: Self-pay | Admitting: Emergency Medicine

## 2012-03-21 DIAGNOSIS — IMO0001 Reserved for inherently not codable concepts without codable children: Secondary | ICD-10-CM | POA: Insufficient documentation

## 2012-03-21 DIAGNOSIS — F17200 Nicotine dependence, unspecified, uncomplicated: Secondary | ICD-10-CM | POA: Insufficient documentation

## 2012-03-21 DIAGNOSIS — K029 Dental caries, unspecified: Secondary | ICD-10-CM

## 2012-03-21 DIAGNOSIS — I10 Essential (primary) hypertension: Secondary | ICD-10-CM | POA: Insufficient documentation

## 2012-03-21 DIAGNOSIS — Z79899 Other long term (current) drug therapy: Secondary | ICD-10-CM | POA: Insufficient documentation

## 2012-03-21 DIAGNOSIS — J449 Chronic obstructive pulmonary disease, unspecified: Secondary | ICD-10-CM | POA: Insufficient documentation

## 2012-03-21 DIAGNOSIS — J4489 Other specified chronic obstructive pulmonary disease: Secondary | ICD-10-CM | POA: Insufficient documentation

## 2012-03-21 MED ORDER — CLINDAMYCIN HCL 150 MG PO CAPS: 150.0000 mg | ORAL_CAPSULE | Freq: Four times a day (QID) | ORAL | Status: DC

## 2012-03-21 NOTE — ED Notes (Signed)
In to give pt d/c papers. Interrupted me to ask me what she is getting for pain. Advised to use tylenol extra strength until dentist appt. Pt states "i cant take tylenol". Asked why and pt state " it makes me nauseated". Offered nausea meds but pt refused. NP aware.

## 2012-03-21 NOTE — ED Provider Notes (Signed)
Medical screening examination/treatment/procedure(s) were performed by non-physician practitioner and as supervising physician I was immediately available for consultation/collaboration. Kiera Hussey, MD, FACEP   Kalicia Dufresne L Levern Kalka, MD 03/21/12 1624 

## 2012-03-21 NOTE — ED Notes (Signed)
Pt was asked if she took any motrin for pain at home. Pt states "oh, Im allergic to motrin, it makes me nauseated". Pt requested for motrin and tramadol to be added to allergy list.

## 2012-03-21 NOTE — ED Notes (Signed)
Pt has chronic dental pains/dental carries. A lot are gone or /rotted. Pt c/o pain to r lower x 1 week. Had dental appt but snowed so appt was cancelled. Nad.

## 2012-03-21 NOTE — ED Provider Notes (Signed)
History     CSN: [id number]  Arrival date & time 03/21/12  1021   None     Chief Complaint  Patient presents with  . Dental Pain    HPI KARALYNE NUSSER is a 62 y.o. female who presents to the ED with dental pain. This is a recurrent problem. Patient states he had appointment with a dentist but it was during the snow and had to cancel. The onset of this episode was one week ago. The pain is located in the right upper and lower teeth. She was here in 11/13 for same and given tramadol and motrin. Today states she is allergic to tramadol, motrin and Vicodin. She denies any other problems. The history was provided by the patient.  Past Medical History  Diagnosis Date  . COPD (chronic obstructive pulmonary disease)   . Fibromyalgia   . Hypertension     Past Surgical History  Procedure Laterality Date  . Neck surgery    . Tonsillectomy    . Tubal ligation      History reviewed. No pertinent family history.  History  Substance Use Topics  . Smoking status: Current Every Day Smoker  . Smokeless tobacco: Not on file  . Alcohol Use: No    OB History   Grav Para Term Preterm Abortions TAB SAB Ect Mult Living                  Review of Systems  Constitutional: Negative for fever, chills and activity change.  HENT: Positive for dental problem. Negative for sore throat and facial swelling.   Cardiovascular: Negative for chest pain and palpitations.  Gastrointestinal: Negative for nausea, vomiting and abdominal pain.  Musculoskeletal: Negative for back pain.  Psychiatric/Behavioral: Negative for confusion.    Allergies  Motrin; Penicillins; Tiotropium bromide monohydrate; Tramadol; and Vicodin  Home Medications   Current Outpatient Rx  Name  Route  Sig  Dispense  Refill  . albuterol (VENTOLIN HFA) 108 (90 BASE) MCG/ACT inhaler   Inhalation   Inhale 2 puffs into the lungs every 4 (four) hours as needed. For shortness of breath          . albuterol-ipratropium (COMBIVENT)  18-103 MCG/ACT inhaler   Inhalation   Inhale 2 puffs into the lungs every 6 (six) hours as needed. For shortness of breath          . carvedilol (COREG) 3.125 MG tablet   Oral   Take 3.125 mg by mouth 2 (two) times daily with a meal.           . clindamycin (CLEOCIN) 150 MG capsule   Oral   Take 2 capsules (300 mg total) by mouth 3 (three) times daily.   60 capsule   0   . escitalopram (LEXAPRO) 10 MG tablet   Oral   Take 10 mg by mouth every evening.          . felodipine (PLENDIL) 10 MG 24 hr tablet   Oral   Take 10 mg by mouth every evening.          Marland Kitchen losartan (COZAAR) 100 MG tablet   Oral   Take 100 mg by mouth daily.           Marland Kitchen oxyCODONE-acetaminophen (PERCOCET/ROXICET) 5-325 MG per tablet      One tab po q 6 hrs prn pain   20 tablet   0   . traMADol (ULTRAM) 50 MG tablet   Oral   Take 1  tablet (50 mg total) by mouth every 6 (six) hours as needed for pain.   24 tablet   0     BP 157/71  Pulse 67  Temp(Src) 97 F (36.1 C) (Oral)  Resp 17  SpO2 97%  Physical Exam  Nursing note and vitals reviewed. Constitutional: She is oriented to person, place, and time. She appears well-developed and well-nourished. No distress.  Patient does not appear uncomfortable. Taking and moving jaw without difficulty.  HENT:  Head: Normocephalic.  Mouth/Throat: Uvula is midline, oropharynx is clear and moist and mucous membranes are normal. Dental caries present.    Multiple dental caries. Poor dental hygiene. Teeth are broken and decayed. There is no swelling noted.  Eyes: EOM are normal.  Neck: Neck supple.  Cardiovascular: Normal rate and regular rhythm.   Pulmonary/Chest: Effort normal.  Musculoskeletal: Normal range of motion.  Lymphadenopathy:    She has no cervical adenopathy.  Neurological: She is alert and oriented to person, place, and time.  Skin: Skin is warm and dry.  Psychiatric: She has a normal mood and affect. Her behavior is normal. Judgment  and thought content normal.   Procedures  Assessment: 62 y.o. female with dental pain   Dental decay and broken teeth  Plan:  Amoxicillin 500 mg po tid   Pain management   Follow up with dentist as soon as possible  Patient states she is allergic to tramadol, Vicodin and motrin. Will have her take extra strength tylenol and the antibiotic. Discussed with Dr. Lynelle Doctor.   Janne Napoleon, [place] 03/21/12 [phone]

## 2012-04-28 ENCOUNTER — Emergency Department (HOSPITAL_COMMUNITY)
Admission: EM | Admit: 2012-04-28 | Discharge: 2012-04-28 | Disposition: A | Discharge status: Home / Self Care | Payer: Medicaid Other | Attending: Emergency Medicine | Admitting: Emergency Medicine

## 2012-04-28 ENCOUNTER — Encounter (HOSPITAL_COMMUNITY): Payer: Self-pay | Admitting: *Deleted

## 2012-04-28 DIAGNOSIS — IMO0001 Reserved for inherently not codable concepts without codable children: Secondary | ICD-10-CM | POA: Insufficient documentation

## 2012-04-28 DIAGNOSIS — F17200 Nicotine dependence, unspecified, uncomplicated: Secondary | ICD-10-CM | POA: Insufficient documentation

## 2012-04-28 DIAGNOSIS — I10 Essential (primary) hypertension: Secondary | ICD-10-CM | POA: Insufficient documentation

## 2012-04-28 DIAGNOSIS — J449 Chronic obstructive pulmonary disease, unspecified: Secondary | ICD-10-CM | POA: Insufficient documentation

## 2012-04-28 DIAGNOSIS — J4489 Other specified chronic obstructive pulmonary disease: Secondary | ICD-10-CM | POA: Insufficient documentation

## 2012-04-28 DIAGNOSIS — Z79899 Other long term (current) drug therapy: Secondary | ICD-10-CM | POA: Insufficient documentation

## 2012-04-28 DIAGNOSIS — K029 Dental caries, unspecified: Secondary | ICD-10-CM | POA: Insufficient documentation

## 2012-04-28 DIAGNOSIS — Z880 Allergy status to penicillin: Secondary | ICD-10-CM | POA: Insufficient documentation

## 2012-04-28 DIAGNOSIS — K047 Periapical abscess without sinus: Secondary | ICD-10-CM | POA: Insufficient documentation

## 2012-04-28 MED ORDER — CLINDAMYCIN HCL 150 MG PO CAPS
150.0000 mg | ORAL_CAPSULE | Freq: Once | ORAL | Status: AC
Start: 2012-04-28 — End: 2012-04-28
  Administered 2012-04-28: 150 mg via ORAL
  Filled 2012-04-28: qty 1

## 2012-04-28 MED ORDER — CLINDAMYCIN HCL 150 MG PO CAPS: 150.0000 mg | ORAL_CAPSULE | Freq: Four times a day (QID) | ORAL | Status: DC

## 2012-04-28 MED ORDER — OXYCODONE-ACETAMINOPHEN 5-325 MG PO TABS
1.0000 | ORAL_TABLET | Freq: Once | ORAL | Status: AC
  Administered 2012-04-28: 1 via ORAL
  Filled 2012-04-28: qty 1

## 2012-04-28 MED ORDER — OXYCODONE-ACETAMINOPHEN 5-325 MG PO TABS: 1.0000 | ORAL_TABLET | ORAL | Status: DC | PRN

## 2012-04-28 NOTE — ED Notes (Signed)
Dental pain. 

## 2012-04-28 NOTE — Discharge Instructions (Signed)
Abscessed Tooth A tooth abscess is a collection of infected fluid (pus) from a bacterial infection in the inner part of the tooth (pulp). It usually occurs at the end of the tooth's root.  CAUSES   A very bad cavity (extensive tooth decay).   Trauma to the tooth, such as a broken or chipped tooth, that allows bacteria to enter into the pulp.  SYMPTOMS  Severe pain in and around the infected tooth.   Swelling and redness around the abscessed tooth or in the mouth or face.   Tenderness.   Pus drainage.   Bad breath.   Bitter taste in the mouth.   Difficulty swallowing.   Difficulty opening the mouth.   Feeling sick to your stomach (nauseous).   Vomiting.   Chills.   Swollen neck glands.  DIAGNOSIS  A medical and dental history will be taken.   An examination will be performed by tapping on the abscessed tooth.   X-rays may be taken of the tooth to identify the abscess.  TREATMENT The goal of treatment is to eliminate the infection.   You may be prescribed antibiotic medicine to stop the infection from spreading.   A root canal may be performed to save the tooth. If the tooth cannot be saved, it may be pulled (extracted) and the abscess may be drained.  HOME CARE INSTRUCTIONS  Only take over-the-counter or prescription medicines for pain, fever, or discomfort as directed by your caregiver.   Do not drive after taking pain medicine (narcotics).   Rinse your mouth (gargle) often with salt water ( tsp salt in 8 oz of warm water) to relieve pain or swelling.   Do not apply heat to the outside of your face.   Return to your dentist for further treatment as directed.  SEEK IMMEDIATE DENTAL CARE IF:  You have a temperature by mouth above 102 F (38.9 C), not controlled by medicine.   You have chills or a very bad headache.   You have problems breathing or swallowing.   Your have trouble opening your mouth.   You develop swelling in the neck or around the eye.    Your pain is not helped by medicine.   Your pain is getting worse instead of better.  Document Released: 01/20/2005 Document Revised: 01/09/2011 Document Reviewed: 04/30/2010 [facility] Patient Information 2012 [place], [place].   Complete your entire course of antibiotics as prescribed.  You  may use the oxycodone for pain relief but do not drive within 4 hours of taking as this will make you drowsy.  Avoid applying heat or ice to this abscess area which can worsen your symptoms.  You may use warm salt water swish and spit treatment or half peroxide and water swish and spit after meals to keep this area clean as discussed.  Call your dentist in [place] for further assistance with your symptoms.

## 2012-04-28 NOTE — ED Notes (Signed)
Pt c/o right side dental pain since this morning. Pt states she had abscess and "popped out the puss" last night.

## 2012-05-02 NOTE — ED Provider Notes (Signed)
History     CSN: [id number]  Arrival date & time 04/28/12  4098   First MD Initiated Contact with Patient 04/28/12 1850      Chief Complaint  Patient presents with  . Dental Pain    (Consider location/radiation/quality/duration/timing/severity/associated sxs/prior treatment) HPI Comments: Lindsay Salazar is a 62 y.o. female presenting with a 2 day history of dental pain and gingival swelling.   The patient has a history of injury, fracture and decay to the tooth involved which has recently started to cause pain.  There has been no fevers,  Chills, nausea or vomiting, also no complaint of difficulty swallowing,  Although chewing makes pain worse.  The patient has tried bc powders without relief of symptoms.      The history is provided by the patient.    Past Medical History  Diagnosis Date  . COPD (chronic obstructive pulmonary disease)   . Fibromyalgia   . Hypertension     Past Surgical History  Procedure Laterality Date  . Neck surgery    . Tonsillectomy    . Tubal ligation      No family history on file.  History  Substance Use Topics  . Smoking status: Current Every Day Smoker  . Smokeless tobacco: Not on file  . Alcohol Use: No    OB History   Grav Para Term Preterm Abortions TAB SAB Ect Mult Living                  Review of Systems  Constitutional: Negative for fever.  HENT: Positive for dental problem. Negative for sore throat, facial swelling, neck pain and neck stiffness.   Respiratory: Negative for shortness of breath.     Allergies  Motrin; Penicillins; Tiotropium bromide monohydrate; Tramadol; and Vicodin  Home Medications   Current Outpatient Rx  Name  Route  Sig  Dispense  Refill  . albuterol (VENTOLIN HFA) 108 (90 BASE) MCG/ACT inhaler   Inhalation   Inhale 2 puffs into the lungs 2 (two) times daily. For shortness of breath         . albuterol-ipratropium (COMBIVENT) 18-103 MCG/ACT inhaler   Inhalation   Inhale 2 puffs into the lungs  2 (two) times daily. For shortness of breath         . carvedilol (COREG) 3.125 MG tablet   Oral   Take 3.125 mg by mouth 2 (two) times daily with a meal.           . escitalopram (LEXAPRO) 10 MG tablet   Oral   Take 10 mg by mouth every evening.          . felodipine (PLENDIL) 10 MG 24 hr tablet   Oral   Take 10 mg by mouth daily.          Marland Kitchen losartan (COZAAR) 100 MG tablet   Oral   Take 100 mg by mouth daily.           . meloxicam (MOBIC) 7.5 MG tablet   Oral   Take 7.5 mg by mouth at bedtime.         . clindamycin (CLEOCIN) 150 MG capsule   Oral   Take 1 capsule (150 mg total) by mouth every 6 (six) hours.   28 capsule   0   . oxyCODONE-acetaminophen (PERCOCET/ROXICET) 5-325 MG per tablet   Oral   Take 1 tablet by mouth every 4 (four) hours as needed for pain.   10 tablet   0  BP 158/113  Pulse 77  Temp(Src) 97.9 F (36.6 C) (Oral)  Resp 20  SpO2 100%  Physical Exam  Constitutional: She is oriented to person, place, and time. She appears well-developed and well-nourished. No distress.  HENT:  Head: Normocephalic and atraumatic. No trismus in the jaw.  Right Ear: Tympanic membrane and external ear normal.  Left Ear: Tympanic membrane and external ear normal.  Mouth/Throat: Oropharynx is clear and moist and mucous membranes are normal. No oral lesions. Dental abscesses present.    Generalized poor dentition,  Partially edentulous.  Eyes: Conjunctivae are normal.  Neck: Normal range of motion. Neck supple.  Cardiovascular: Normal rate and normal heart sounds.   Pulmonary/Chest: Effort normal.  Abdominal: She exhibits no distension.  Musculoskeletal: Normal range of motion.  Lymphadenopathy:    She has no cervical adenopathy.  Neurological: She is alert and oriented to person, place, and time.  Skin: Skin is warm and dry. No erythema.  Psychiatric: She has a normal mood and affect.    ED Course  Procedures (including critical care  time)  Labs Reviewed - No data to display No results found.   1. Dental abscess       MDM  Pt with gingival edema and obvious dental decay with no fluctuance of gingiva or facial induration.  Placed on clindamycin, oxycodone prescribed.  She has a Education officer, community in [place] for f/u.        Burgess Amor, PA-C 05/02/12 2127

## 2012-05-10 NOTE — ED Provider Notes (Signed)
Medical screening examination/treatment/procedure(s) were performed by non-physician practitioner and as supervising physician I was immediately available for consultation/collaboration.  Erikah Thumm, MD 05/10/12 1205 

## 2012-08-16 ENCOUNTER — Encounter (HOSPITAL_COMMUNITY): Payer: Self-pay | Admitting: *Deleted

## 2012-08-16 ENCOUNTER — Emergency Department (HOSPITAL_COMMUNITY)
Admission: EM | Admit: 2012-08-16 | Discharge: 2012-08-16 | Disposition: A | Discharge status: Home / Self Care | Payer: Medicaid Other | Attending: Emergency Medicine | Admitting: Emergency Medicine

## 2012-08-16 DIAGNOSIS — I10 Essential (primary) hypertension: Secondary | ICD-10-CM | POA: Insufficient documentation

## 2012-08-16 DIAGNOSIS — J4489 Other specified chronic obstructive pulmonary disease: Secondary | ICD-10-CM | POA: Insufficient documentation

## 2012-08-16 DIAGNOSIS — Z79899 Other long term (current) drug therapy: Secondary | ICD-10-CM | POA: Insufficient documentation

## 2012-08-16 DIAGNOSIS — K047 Periapical abscess without sinus: Secondary | ICD-10-CM | POA: Insufficient documentation

## 2012-08-16 DIAGNOSIS — IMO0001 Reserved for inherently not codable concepts without codable children: Secondary | ICD-10-CM | POA: Insufficient documentation

## 2012-08-16 DIAGNOSIS — K029 Dental caries, unspecified: Secondary | ICD-10-CM | POA: Insufficient documentation

## 2012-08-16 DIAGNOSIS — F17200 Nicotine dependence, unspecified, uncomplicated: Secondary | ICD-10-CM | POA: Insufficient documentation

## 2012-08-16 DIAGNOSIS — J449 Chronic obstructive pulmonary disease, unspecified: Secondary | ICD-10-CM | POA: Insufficient documentation

## 2012-08-16 DIAGNOSIS — Z880 Allergy status to penicillin: Secondary | ICD-10-CM | POA: Insufficient documentation

## 2012-08-16 MED ORDER — CLINDAMYCIN HCL 150 MG PO CAPS: 150.0000 mg | ORAL_CAPSULE | Freq: Four times a day (QID) | ORAL | Status: DC

## 2012-08-16 MED ORDER — OXYCODONE-ACETAMINOPHEN 5-325 MG PO TABS: 1.0000 | ORAL_TABLET | ORAL | Status: DC | PRN

## 2012-08-16 NOTE — ED Notes (Signed)
Dental pain for 2 days Lt upper tooth

## 2012-08-20 NOTE — ED Provider Notes (Signed)
History    CSN: [id number] Arrival date & time 08/16/12  1903  First MD Initiated Contact with Patient 08/16/12 1930     Chief Complaint  Patient presents with  . Dental Pain   (Consider location/radiation/quality/duration/timing/severity/associated sxs/prior Treatment) HPI Comments: Lindsay Salazar is a 62 y.o. Female with left central incisor pain and surrounding gingivitis which is chronic.  This tooth is loose, in fact she has attempted to pull the tooth in the past with no success.  She has taken tylenol without relief of pain.  There is surrounding swelling but no drainage from the area.  She denies fevers, chills, nausea.  She does have a dentist in [place],  And is trying to get an appointment next week.       The history is provided by the patient.   Past Medical History  Diagnosis Date  . COPD (chronic obstructive pulmonary disease)   . Fibromyalgia   . Hypertension    Past Surgical History  Procedure Laterality Date  . Neck surgery    . Tonsillectomy    . Tubal ligation     History reviewed. No pertinent family history. History  Substance Use Topics  . Smoking status: Current Every Day Smoker  . Smokeless tobacco: Not on file  . Alcohol Use: No   OB History   Grav Para Term Preterm Abortions TAB SAB Ect Mult Living                 Review of Systems  Constitutional: Negative for fever.  HENT: Positive for dental problem. Negative for sore throat, facial swelling, neck pain and neck stiffness.   Respiratory: Negative for shortness of breath.     Allergies  Motrin; Penicillins; Tiotropium bromide monohydrate; Tramadol; and Vicodin  Home Medications   Current Outpatient Rx  Name  Route  Sig  Dispense  Refill  . albuterol (VENTOLIN HFA) 108 (90 BASE) MCG/ACT inhaler   Inhalation   Inhale 2 puffs into the lungs 2 (two) times daily. For shortness of breath         . albuterol-ipratropium (COMBIVENT) 18-103 MCG/ACT inhaler   Inhalation   Inhale 2 puffs  into the lungs 2 (two) times daily. For shortness of breath         . carvedilol (COREG) 3.125 MG tablet   Oral   Take 3.125 mg by mouth 2 (two) times daily with a meal.           . escitalopram (LEXAPRO) 10 MG tablet   Oral   Take 10 mg by mouth every evening.          . felodipine (PLENDIL) 10 MG 24 hr tablet   Oral   Take 10 mg by mouth daily.          . meloxicam (MOBIC) 7.5 MG tablet   Oral   Take 7.5 mg by mouth at bedtime.         . clindamycin (CLEOCIN) 150 MG capsule   Oral   Take 1 capsule (150 mg total) by mouth every 6 (six) hours.   28 capsule   0   . oxyCODONE-acetaminophen (PERCOCET/ROXICET) 5-325 MG per tablet   Oral   Take 1 tablet by mouth every 4 (four) hours as needed for pain.   12 tablet   0    BP 151/79  Pulse 75  Temp(Src) 98 F (36.7 C) (Oral)  Resp 20  Ht 5\' 4"  (1.626 m)  Wt 131 lb (59.421 kg)  BMI 22.47 kg/m2  SpO2 99% Physical Exam  Constitutional: She is oriented to person, place, and time. She appears well-developed and well-nourished. No distress.  HENT:  Head: Normocephalic and atraumatic.  Right Ear: Tympanic membrane and external ear normal.  Left Ear: Tympanic membrane and external ear normal.  Mouth/Throat: Oropharynx is clear and moist and mucous membranes are normal. No oral lesions. Dental abscesses present.    Partially edentulous.  Eyes: Conjunctivae are normal.  Neck: Normal range of motion. Neck supple.  Cardiovascular: Normal rate and normal heart sounds.   Pulmonary/Chest: Effort normal.  Abdominal: She exhibits no distension.  Musculoskeletal: Normal range of motion.  Lymphadenopathy:    She has no cervical adenopathy.  Neurological: She is alert and oriented to person, place, and time.  Skin: Skin is warm and dry. No erythema.  Psychiatric: She has a normal mood and affect.    ED Course  Procedures (including critical care time) Labs Reviewed - No data to display No results found. 1. Dental  decay     MDM  Prescribed clindamycin, oxycodone.  Encouraged f/u with dentist.  The patient appears reasonably screened and/or stabilized for discharge and I doubt any other medical condition or other [facility] requiring further screening, evaluation, or treatment in the ED at this time prior to discharge.   Burgess Amor, PA-C 08/20/12 2352

## 2012-08-21 NOTE — ED Provider Notes (Signed)
Medical screening examination/treatment/procedure(s) were performed by non-physician practitioner and as supervising physician I was immediately available for consultation/collaboration.   Benny Lennert, MD 08/21/12 [phone]

## 2012-10-01 ENCOUNTER — Emergency Department (HOSPITAL_COMMUNITY)
Admission: EM | Admit: 2012-10-01 | Discharge: 2012-10-01 | Disposition: A | Discharge status: Home / Self Care | Payer: Medicaid Other | Attending: Emergency Medicine | Admitting: Emergency Medicine

## 2012-10-01 ENCOUNTER — Encounter (HOSPITAL_COMMUNITY): Payer: Self-pay | Admitting: *Deleted

## 2012-10-01 DIAGNOSIS — J4489 Other specified chronic obstructive pulmonary disease: Secondary | ICD-10-CM | POA: Insufficient documentation

## 2012-10-01 DIAGNOSIS — J449 Chronic obstructive pulmonary disease, unspecified: Secondary | ICD-10-CM | POA: Insufficient documentation

## 2012-10-01 DIAGNOSIS — I10 Essential (primary) hypertension: Secondary | ICD-10-CM | POA: Insufficient documentation

## 2012-10-01 DIAGNOSIS — Z791 Long term (current) use of non-steroidal anti-inflammatories (NSAID): Secondary | ICD-10-CM | POA: Insufficient documentation

## 2012-10-01 DIAGNOSIS — IMO0002 Reserved for concepts with insufficient information to code with codable children: Secondary | ICD-10-CM | POA: Insufficient documentation

## 2012-10-01 DIAGNOSIS — Z9089 Acquired absence of other organs: Secondary | ICD-10-CM | POA: Insufficient documentation

## 2012-10-01 DIAGNOSIS — F17200 Nicotine dependence, unspecified, uncomplicated: Secondary | ICD-10-CM | POA: Insufficient documentation

## 2012-10-01 DIAGNOSIS — K029 Dental caries, unspecified: Secondary | ICD-10-CM | POA: Insufficient documentation

## 2012-10-01 DIAGNOSIS — Z79899 Other long term (current) drug therapy: Secondary | ICD-10-CM | POA: Insufficient documentation

## 2012-10-01 DIAGNOSIS — K0889 Other specified disorders of teeth and supporting structures: Secondary | ICD-10-CM

## 2012-10-01 DIAGNOSIS — K089 Disorder of teeth and supporting structures, unspecified: Secondary | ICD-10-CM | POA: Insufficient documentation

## 2012-10-01 DIAGNOSIS — Z9889 Other specified postprocedural states: Secondary | ICD-10-CM | POA: Insufficient documentation

## 2012-10-01 DIAGNOSIS — R0602 Shortness of breath: Secondary | ICD-10-CM | POA: Insufficient documentation

## 2012-10-01 DIAGNOSIS — Z880 Allergy status to penicillin: Secondary | ICD-10-CM | POA: Insufficient documentation

## 2012-10-01 MED ORDER — PROMETHAZINE HCL 25 MG PO TABS: 25.0000 mg | ORAL_TABLET | Freq: Four times a day (QID) | ORAL | Status: DC | PRN

## 2012-10-01 MED ORDER — CLINDAMYCIN HCL 150 MG PO CAPS: ORAL_CAPSULE | ORAL | Status: DC

## 2012-10-01 MED ORDER — ONDANSETRON HCL 4 MG PO TABS
4.0000 mg | ORAL_TABLET | Freq: Once | ORAL | Status: AC
  Administered 2012-10-01: 4 mg via ORAL
  Filled 2012-10-01: qty 1

## 2012-10-01 MED ORDER — KETOROLAC TROMETHAMINE 10 MG PO TABS
10.0000 mg | ORAL_TABLET | Freq: Once | ORAL | Status: AC
  Administered 2012-10-01: 10 mg via ORAL
  Filled 2012-10-01: qty 1

## 2012-10-01 MED ORDER — CLINDAMYCIN HCL 150 MG PO CAPS
300.0000 mg | ORAL_CAPSULE | Freq: Once | ORAL | Status: AC
  Administered 2012-10-01: 300 mg via ORAL
  Filled 2012-10-01: qty 2

## 2012-10-01 MED ORDER — DICLOFENAC SODIUM 75 MG PO TBEC: 75.0000 mg | DELAYED_RELEASE_TABLET | Freq: Two times a day (BID) | ORAL | Status: DC

## 2012-10-01 NOTE — ED Notes (Signed)
Pt c/o pain to teeth on upper and lower right jaw.

## 2012-10-01 NOTE — ED Provider Notes (Signed)
CSN: [id number]     Arrival date & time 10/01/12  1825 History   First MD Initiated Contact with Patient 10/01/12 1911     Chief Complaint  Patient presents with  . Dental Pain   (Consider location/radiation/quality/duration/timing/severity/associated sxs/prior Treatment) Patient is a 62 y.o. female presenting with tooth pain. The history is provided by the patient.  Dental Pain Location:  Upper and lower Quality:  Aching Severity:  Moderate Onset quality:  Gradual Duration:  1 week Timing:  Intermittent Progression:  Worsening Chronicity:  Chronic Context: dental caries and poor dentition   Relieved by:  Nothing Associated symptoms: facial pain and gum swelling   Associated symptoms: no difficulty swallowing and no neck pain   Risk factors: smoking     Past Medical History  Diagnosis Date  . COPD (chronic obstructive pulmonary disease)   . Fibromyalgia   . Hypertension    Past Surgical History  Procedure Laterality Date  . Neck surgery    . Tonsillectomy    . Tubal ligation     History reviewed. No pertinent family history. History  Substance Use Topics  . Smoking status: Current Every Day Smoker  . Smokeless tobacco: Not on file  . Alcohol Use: No   OB History   Grav Para Term Preterm Abortions TAB SAB Ect Mult Living                 Review of Systems  Constitutional: Negative for activity change.       All ROS Neg except as noted in HPI  HENT: Positive for dental problem. Negative for nosebleeds and neck pain.   Eyes: Negative for photophobia and discharge.  Respiratory: Positive for shortness of breath. Negative for cough and wheezing.   Cardiovascular: Negative for chest pain and palpitations.  Gastrointestinal: Negative for abdominal pain and blood in stool.  Genitourinary: Negative for dysuria, frequency and hematuria.  Musculoskeletal: Negative for back pain and arthralgias.  Skin: Negative.   Neurological: Negative for dizziness, seizures and  speech difficulty.  Psychiatric/Behavioral: Negative for hallucinations and confusion.    Allergies  Ibuprofen; Penicillins; Tiotropium bromide monohydrate; Tramadol; and Vicodin  Home Medications   Current Outpatient Rx  Name  Route  Sig  Dispense  Refill  . albuterol (VENTOLIN HFA) 108 (90 BASE) MCG/ACT inhaler   Inhalation   Inhale 2 puffs into the lungs 2 (two) times daily. For shortness of breath         . albuterol-ipratropium (COMBIVENT) 18-103 MCG/ACT inhaler   Inhalation   Inhale 2 puffs into the lungs 2 (two) times daily. For shortness of breath         . beclomethasone (QVAR) 40 MCG/ACT inhaler   Inhalation   Inhale 2 puffs into the lungs 2 (two) times daily.         . carvedilol (COREG) 3.125 MG tablet   Oral   Take 3.125 mg by mouth 2 (two) times daily with a meal.           . escitalopram (LEXAPRO) 10 MG tablet   Oral   Take 10 mg by mouth every evening.          . felodipine (PLENDIL) 10 MG 24 hr tablet   Oral   Take 10 mg by mouth daily.          . meloxicam (MOBIC) 7.5 MG tablet   Oral   Take 7.5 mg by mouth at bedtime.         Marland Kitchen  clindamycin (CLEOCIN) 150 MG capsule      1 po tid with food   21 capsule   0   . diclofenac (VOLTAREN) 75 MG EC tablet   Oral   Take 1 tablet (75 mg total) by mouth 2 (two) times daily.   12 tablet   0     Please take this medication with food   . promethazine (PHENERGAN) 25 MG tablet   Oral   Take 1 tablet (25 mg total) by mouth every 6 (six) hours as needed for nausea.   12 tablet   0    BP 119/75  Pulse 77  Temp(Src) 98.2 F (36.8 C) (Oral)  Resp 18  Ht 5\' 8"  (1.727 m)  Wt 131 lb (59.421 kg)  BMI 19.92 kg/m2  SpO2 98% Physical Exam  Nursing note and vitals reviewed. Constitutional: She is oriented to person, place, and time. She appears well-developed and well-nourished.  Non-toxic appearance.  HENT:  Head: Normocephalic.  Right Ear: Tympanic membrane and external ear normal.  Left  Ear: Tympanic membrane and external ear normal.  Patient has multiple dental caries to the level of the gum line of the upper and lower gum. Right side more so than left. No visible abscess appreciated. Airway is patent. No swelling under the tongue.  Eyes: EOM and lids are normal. Pupils are equal, round, and reactive to light.  Neck: Normal range of motion. Neck supple. Carotid bruit is not present.  Cardiovascular: Normal rate, regular rhythm, normal heart sounds, intact distal pulses and normal pulses.   Pulmonary/Chest: Breath sounds normal. No respiratory distress.  Abdominal: Soft. Bowel sounds are normal. There is no tenderness. There is no guarding.  Musculoskeletal: Normal range of motion.  Lymphadenopathy:       Head (right side): No submandibular adenopathy present.       Head (left side): No submandibular adenopathy present.    She has no cervical adenopathy.  Neurological: She is alert and oriented to person, place, and time. She has normal strength. No cranial nerve deficit or sensory deficit.  Skin: Skin is warm and dry.  Psychiatric: She has a normal mood and affect. Her speech is normal.    ED Course  Procedures (including critical care time) Labs Review Labs Reviewed - No data to display Imaging Review No results found.  MDM   1. Toothache    *I have reviewed nursing notes, vital signs, and all appropriate lab and imaging results for this patient.**  Patient has multiple dental caries of the upper and lower gum. She has several teeth are decayed to the gum line. Vital signs are stable.  Patient states she has an appointment with the dentists on next Thursday. Prescription for Cleocin 3 times daily, diclofenac 2 times daily, and promethazine every 6 hours as needed for nausea given to the patient.  Kathie Dike, PA-C 10/01/12 1956

## 2012-10-01 NOTE — ED Notes (Signed)
Mult dental caries , Has appt to see dentist next Thursday.

## 2012-10-02 NOTE — ED Provider Notes (Signed)
Medical screening examination/treatment/procedure(s) were performed by non-physician practitioner and as supervising physician I was immediately available for consultation/collaboration.    Tyauna Lacaze D Kaili Castille, MD 10/02/12 1743 

## 2015-01-22 ENCOUNTER — Other Ambulatory Visit (HOSPITAL_COMMUNITY): Payer: Self-pay | Admitting: Physician Assistant

## 2015-01-22 DIAGNOSIS — Z1231 Encounter for screening mammogram for malignant neoplasm of breast: Secondary | ICD-10-CM

## 2015-02-09 ENCOUNTER — Ambulatory Visit (HOSPITAL_COMMUNITY): Payer: Medicaid Other

## 2015-03-26 ENCOUNTER — Telehealth: Payer: Self-pay | Admitting: Orthopaedic Surgery

## 2015-03-26 NOTE — Telephone Encounter (Signed)
Patient requests Hydrocodone 5-325 mgs.  Qty 120

## 2015-03-27 MED ORDER — HYDROCODONE-ACETAMINOPHEN 5-325 MG PO TABS: 1.0000 | ORAL_TABLET | ORAL | Status: DC | PRN

## 2015-03-27 NOTE — Telephone Encounter (Signed)
Prescription available, patient aware  

## 2015-03-27 NOTE — Telephone Encounter (Signed)
Rx printed

## 2015-04-09 ENCOUNTER — Ambulatory Visit (HOSPITAL_COMMUNITY)
Admission: RE | Admit: 2015-04-09 | Discharge: 2015-04-09 | Disposition: A | Discharge status: Home / Self Care | Payer: Medicare Other | Source: Ambulatory Visit | Attending: Physician Assistant | Admitting: Physician Assistant

## 2015-04-09 DIAGNOSIS — Z1231 Encounter for screening mammogram for malignant neoplasm of breast: Secondary | ICD-10-CM | POA: Diagnosis present

## 2015-04-24 ENCOUNTER — Telehealth: Payer: Self-pay | Admitting: Orthopaedic Surgery

## 2015-04-24 MED ORDER — HYDROCODONE-ACETAMINOPHEN 5-325 MG PO TABS: 1.0000 | ORAL_TABLET | ORAL | Status: DC | PRN

## 2015-04-24 NOTE — Telephone Encounter (Signed)
Rx printed

## 2015-04-24 NOTE — Telephone Encounter (Signed)
Patient requesting Hydrocodone refill - will pick up at appointment 3/22

## 2015-04-25 ENCOUNTER — Ambulatory Visit (INDEPENDENT_AMBULATORY_CARE_PROVIDER_SITE_OTHER): Payer: Medicare Other | Admitting: Orthopaedic Surgery

## 2015-04-25 VITALS — BP 118/82 | HR 89 | Temp 97.5°F | Ht 64.0 in | Wt 126.0 lb

## 2015-04-25 DIAGNOSIS — M25551 Pain in right hip: Secondary | ICD-10-CM

## 2015-04-25 NOTE — Progress Notes (Signed)
Patient ID:Lindsay Salazar, female DOB:08/18/1950, 65 y.o. [id number]  Chief Complaint  Patient presents with  . Hip Pain    Right hip pain    HPI  Lindsay Salazar is a 65 y.o. female who has chronic right hip pain.  She has no paresthesias.  She has no new trauma.  She is doing her exercises. She is walking regularly.  Hip Pain  The pain is present in the right hip. The quality of the pain is described as aching. The pain is at a severity of 3/10. The pain is mild. The symptoms are aggravated by weight bearing. She has tried heat, ice and NSAIDs for the symptoms. The treatment provided moderate relief.    Body mass index is 21.62 kg/(m^2).  Review of Systems  Patient does not have Diabetes Mellitus. Patient has hypertension. Patient has COPD or shortness of breath. Patient does not have BMI > 35. Patient has current smoking history.  Review of Systems  Past Medical History  Diagnosis Date  . COPD (chronic obstructive pulmonary disease)   . Fibromyalgia   . Hypertension     Past Surgical History  Procedure Laterality Date  . Neck surgery    . Tonsillectomy    . Tubal ligation      No family history on file.  Social History Social History  Substance Use Topics  . Smoking status: Current Every Day Smoker  . Smokeless tobacco: Not on file  . Alcohol Use: No    Allergies  Allergen Reactions  . Ibuprofen Nausea And Vomiting  . Penicillins Swelling  . Tiotropium Bromide Monohydrate Itching  . Tramadol Nausea And Vomiting  . Vicodin [Hydrocodone-Acetaminophen] Nausea And Vomiting    Current Outpatient Prescriptions  Medication Sig Dispense Refill  . albuterol (VENTOLIN HFA) 108 (90 BASE) MCG/ACT inhaler Inhale 2 puffs into the lungs 2 (two) times daily. For shortness of breath    . albuterol-ipratropium (COMBIVENT) 18-103 MCG/ACT inhaler Inhale 2 puffs into the lungs 2 (two) times daily. For shortness of breath    . beclomethasone (QVAR) 40 MCG/ACT inhaler Inhale 2  puffs into the lungs 2 (two) times daily.    . carvedilol (COREG) 3.125 MG tablet Take 3.125 mg by mouth 2 (two) times daily with a meal.      . clindamycin (CLEOCIN) 150 MG capsule 1 po tid with food 21 capsule 0  . diclofenac (VOLTAREN) 75 MG EC tablet Take 1 tablet (75 mg total) by mouth 2 (two) times daily. 12 tablet 0  . escitalopram (LEXAPRO) 10 MG tablet Take 10 mg by mouth every evening.     . felodipine (PLENDIL) 10 MG 24 hr tablet Take 10 mg by mouth daily.     Marland Kitchen HYDROcodone-acetaminophen (NORCO/VICODIN) 5-325 MG tablet Take 1 tablet by mouth every 4 (four) hours as needed for moderate pain (Must last 30 days.  Do not take and drive a car or use machinery.). 120 tablet 0  . meloxicam (MOBIC) 7.5 MG tablet Take 7.5 mg by mouth at bedtime.    . promethazine (PHENERGAN) 25 MG tablet Take 1 tablet (25 mg total) by mouth every 6 (six) hours as needed for nausea. 12 tablet 0   No current facility-administered medications for this visit.     Physical Exam  Blood pressure 118/82, pulse 89, temperature 97.5 F (36.4 C), height 5\' 4"  (1.626 m), weight 126 lb (57.153 kg).  Constitutional: overall normal hygiene, normal nutrition, well developed, normal grooming, normal body habitus. Assistive device:none  Musculoskeletal: gait and station Limp right slightly, muscle tone and strength are normal, no tremors or atrophy is present.  .  Neurological: coordination overall normal.  Deep tendon reflex/nerve stretch intact.  Sensation normal.  Cranial nerves II-XII intact.   Skin:   normal overall no scars, lesions, ulcers or rashes. No psoriasis.  Psychiatric: Alert and oriented x 3.  Recent memory intact, remote memory unclear.  Normal mood and affect. Well groomed.  Good eye contact.  Cardiovascular: overall no swelling, no varicosities, no edema bilaterally, normal temperatures of the legs and arms, no clubbing, cyanosis and good capillary refill.  Lymphatic: palpation is  normal.   Extremities:the right hip has some slight tenderness.  She has no pain over the trochanteric bursa. Inspection normal right hip Strength and tone normal right hip Range of motion full right hip  The patient has been educated about the nature of the problem(s) and counseled on treatment options.  The patient appeared to understand what I have discussed and is in agreement with it.  Encounter Diagnosis  Name Primary?  . Right hip pain Yes    PLAN Call if any problems.  Precautions discussed.  Continue current medications.   Return to clinic 3 months

## 2015-05-23 ENCOUNTER — Telehealth: Payer: Self-pay | Admitting: Orthopaedic Surgery

## 2015-05-24 MED ORDER — HYDROCODONE-ACETAMINOPHEN 5-325 MG PO TABS: 1.0000 | ORAL_TABLET | ORAL | Status: DC | PRN

## 2015-05-24 NOTE — Telephone Encounter (Signed)
Rx done. 

## 2015-05-24 NOTE — Telephone Encounter (Signed)
Hydrocodone-Acetaminophen  5/325mg  Qty 120 Tablets °

## 2015-06-21 ENCOUNTER — Telehealth: Payer: Self-pay | Admitting: Orthopaedic Surgery

## 2015-06-21 MED ORDER — HYDROCODONE-ACETAMINOPHEN 5-325 MG PO TABS: 1.0000 | ORAL_TABLET | ORAL | Status: DC | PRN

## 2015-06-21 NOTE — Telephone Encounter (Signed)
Rx done. 

## 2015-06-21 NOTE — Telephone Encounter (Signed)
Hydrocodone-Acetaminophen  5/325mg  Qty 120 Tablets °

## 2015-06-27 ENCOUNTER — Encounter (HOSPITAL_COMMUNITY): Payer: Self-pay | Admitting: Emergency Medicine

## 2015-06-27 ENCOUNTER — Emergency Department (HOSPITAL_COMMUNITY): Payer: Medicare HMO

## 2015-06-27 ENCOUNTER — Inpatient Hospital Stay (HOSPITAL_COMMUNITY)
Admission: EM | Admit: 2015-06-27 | Discharge: 2015-06-30 | DRG: 190 | Disposition: A | Discharge status: Home / Self Care | Payer: Medicare HMO | Attending: Internal Medicine | Admitting: Internal Medicine

## 2015-06-27 DIAGNOSIS — J181 Lobar pneumonia, unspecified organism: Secondary | ICD-10-CM | POA: Diagnosis present

## 2015-06-27 DIAGNOSIS — I4892 Unspecified atrial flutter: Secondary | ICD-10-CM | POA: Diagnosis present

## 2015-06-27 DIAGNOSIS — I160 Hypertensive urgency: Secondary | ICD-10-CM | POA: Diagnosis not present

## 2015-06-27 DIAGNOSIS — Z8249 Family history of ischemic heart disease and other diseases of the circulatory system: Secondary | ICD-10-CM | POA: Diagnosis not present

## 2015-06-27 DIAGNOSIS — J441 Chronic obstructive pulmonary disease with (acute) exacerbation: Principal | ICD-10-CM | POA: Diagnosis present

## 2015-06-27 DIAGNOSIS — I10 Essential (primary) hypertension: Secondary | ICD-10-CM | POA: Diagnosis present

## 2015-06-27 DIAGNOSIS — Z720 Tobacco use: Secondary | ICD-10-CM

## 2015-06-27 DIAGNOSIS — Z833 Family history of diabetes mellitus: Secondary | ICD-10-CM | POA: Diagnosis not present

## 2015-06-27 DIAGNOSIS — F17200 Nicotine dependence, unspecified, uncomplicated: Secondary | ICD-10-CM | POA: Diagnosis present

## 2015-06-27 DIAGNOSIS — J189 Pneumonia, unspecified organism: Secondary | ICD-10-CM

## 2015-06-27 DIAGNOSIS — M797 Fibromyalgia: Secondary | ICD-10-CM | POA: Diagnosis present

## 2015-06-27 DIAGNOSIS — I4891 Unspecified atrial fibrillation: Secondary | ICD-10-CM | POA: Diagnosis present

## 2015-06-27 DIAGNOSIS — R7989 Other specified abnormal findings of blood chemistry: Secondary | ICD-10-CM | POA: Diagnosis not present

## 2015-06-27 DIAGNOSIS — J440 Chronic obstructive pulmonary disease with acute lower respiratory infection: Secondary | ICD-10-CM | POA: Diagnosis present

## 2015-06-27 DIAGNOSIS — R0602 Shortness of breath: Secondary | ICD-10-CM | POA: Diagnosis present

## 2015-06-27 DIAGNOSIS — Z7982 Long term (current) use of aspirin: Secondary | ICD-10-CM

## 2015-06-27 HISTORY — DX: Other chronic pain: G89.29

## 2015-06-27 HISTORY — DX: Pain in right hip: M25.551

## 2015-06-27 LAB — BASIC METABOLIC PANEL
ANION GAP: 12 (ref 5–15)
BUN: 20 mg/dL (ref 6–20)
CALCIUM: 8.5 mg/dL — AB (ref 8.9–10.3)
CO2: 21 mmol/L — AB (ref 22–32)
CREATININE: 1.08 mg/dL — AB (ref 0.44–1.00)
Chloride: 103 mmol/L (ref 101–111)
GFR calc Af Amer: >60 mL/min (ref >60)
GFR calc non Af Amer: 52 mL/min — ABNORMAL LOW (ref >60)
GLUCOSE: 163 mg/dL — AB (ref 65–99)
Potassium: 3.9 mmol/L (ref 3.5–5.1)
Sodium: 136 mmol/L (ref 135–145)

## 2015-06-27 LAB — CBC WITH DIFFERENTIAL/PLATELET
BASOS PCT: 0 %
Basophils Absolute: 0 10*3/uL (ref 0.0–0.1)
EOS PCT: 0 %
Eosinophils Absolute: 0 10*3/uL (ref 0.0–0.7)
HEMATOCRIT: 42.9 % (ref 36.0–46.0)
Hemoglobin: 14 g/dL (ref 12.0–15.0)
Lymphocytes Relative: 19 %
Lymphs Abs: 1.8 10*3/uL (ref 0.7–4.0)
MCH: 31.1 pg (ref 26.0–34.0)
MCHC: 32.6 g/dL (ref 30.0–36.0)
MCV: 95.3 fL (ref 78.0–100.0)
MONO ABS: 0.7 10*3/uL (ref 0.1–1.0)
MONOS PCT: 7 %
NEUTROS ABS: 6.9 10*3/uL (ref 1.7–7.7)
Neutrophils Relative %: 74 %
PLATELETS: 251 10*3/uL (ref 150–400)
RBC: 4.5 MIL/uL (ref 3.87–5.11)
RDW: 14.5 % (ref 11.5–15.5)
WBC: 9.4 10*3/uL (ref 4.0–10.5)

## 2015-06-27 LAB — TROPONIN I
TROPONIN I: 0.04 ng/mL — AB (ref <0.031)
Troponin I: 0.05 ng/mL — ABNORMAL HIGH (ref <0.031)

## 2015-06-27 LAB — BRAIN NATRIURETIC PEPTIDE: B Natriuretic Peptide: 880 pg/mL — ABNORMAL HIGH (ref 0.0–100.0)

## 2015-06-27 LAB — TSH: TSH: 2.678 u[IU]/mL (ref 0.350–4.500)

## 2015-06-27 LAB — MAGNESIUM: Magnesium: 1.8 mg/dL (ref 1.7–2.4)

## 2015-06-27 MED ORDER — CARVEDILOL 3.125 MG PO TABS
3.1250 mg | ORAL_TABLET | Freq: Two times a day (BID) | ORAL | Status: DC
  Administered 2015-06-27 – 2015-06-30 (×6): 3.125 mg via ORAL
  Filled 2015-06-27 (×5): qty 1

## 2015-06-27 MED ORDER — DILTIAZEM HCL 100 MG IV SOLR
5.0000 mg/h | INTRAVENOUS | Status: DC
  Filled 2015-06-27: qty 100

## 2015-06-27 MED ORDER — DILTIAZEM HCL 100 MG IV SOLR
INTRAVENOUS | Status: AC
  Filled 2015-06-27: qty 100

## 2015-06-27 MED ORDER — MELOXICAM 7.5 MG PO TABS
7.5000 mg | ORAL_TABLET | Freq: Every day | ORAL | Status: DC
  Administered 2015-06-27 – 2015-06-29 (×3): 7.5 mg via ORAL
  Filled 2015-06-27 (×5): qty 1

## 2015-06-27 MED ORDER — CETYLPYRIDINIUM CHLORIDE 0.05 % MT LIQD
7.0000 mL | Freq: Two times a day (BID) | OROMUCOSAL | Status: DC
  Administered 2015-06-27 – 2015-06-30 (×6): 7 mL via OROMUCOSAL

## 2015-06-27 MED ORDER — METHYLPREDNISOLONE SODIUM SUCC 125 MG IJ SOLR
80.0000 mg | Freq: Three times a day (TID) | INTRAMUSCULAR | Status: DC
  Administered 2015-06-27 – 2015-06-28 (×3): 80 mg via INTRAVENOUS
  Filled 2015-06-27 (×3): qty 2

## 2015-06-27 MED ORDER — ACETAMINOPHEN 325 MG PO TABS: 650.0000 mg | ORAL_TABLET | Freq: Four times a day (QID) | ORAL | Status: DC | PRN

## 2015-06-27 MED ORDER — NICOTINE 21 MG/24HR TD PT24
21.0000 mg | MEDICATED_PATCH | Freq: Every day | TRANSDERMAL | Status: DC
  Administered 2015-06-27 – 2015-06-30 (×4): 21 mg via TRANSDERMAL
  Filled 2015-06-27 (×4): qty 1

## 2015-06-27 MED ORDER — ALBUTEROL SULFATE HFA 108 (90 BASE) MCG/ACT IN AERS: 2.0000 | INHALATION_SPRAY | Freq: Two times a day (BID) | RESPIRATORY_TRACT | Status: DC

## 2015-06-27 MED ORDER — ONDANSETRON HCL 4 MG PO TABS: 4.0000 mg | ORAL_TABLET | Freq: Four times a day (QID) | ORAL | Status: DC | PRN

## 2015-06-27 MED ORDER — ENOXAPARIN SODIUM 40 MG/0.4ML ~~LOC~~ SOLN
40.0000 mg | SUBCUTANEOUS | Status: DC
  Administered 2015-06-27 – 2015-06-28 (×2): 40 mg via SUBCUTANEOUS
  Filled 2015-06-27 (×2): qty 0.4

## 2015-06-27 MED ORDER — LEVOFLOXACIN IN D5W 750 MG/150ML IV SOLN
750.0000 mg | Freq: Once | INTRAVENOUS | Status: AC
  Administered 2015-06-27: 750 mg via INTRAVENOUS
  Filled 2015-06-27: qty 150

## 2015-06-27 MED ORDER — ONDANSETRON HCL 4 MG/2ML IJ SOLN: 4.0000 mg | Freq: Four times a day (QID) | INTRAMUSCULAR | Status: DC | PRN

## 2015-06-27 MED ORDER — HYDROCODONE-ACETAMINOPHEN 5-325 MG PO TABS
1.0000 | ORAL_TABLET | ORAL | Status: DC | PRN
  Administered 2015-06-29 – 2015-06-30 (×8): 1 via ORAL
  Filled 2015-06-27 (×8): qty 1

## 2015-06-27 MED ORDER — IPRATROPIUM-ALBUTEROL 0.5-2.5 (3) MG/3ML IN SOLN
3.0000 mL | RESPIRATORY_TRACT | Status: DC
  Administered 2015-06-27 – 2015-06-29 (×11): 3 mL via RESPIRATORY_TRACT
  Filled 2015-06-27 (×11): qty 3

## 2015-06-27 MED ORDER — LEVOFLOXACIN 750 MG PO TABS
750.0000 mg | ORAL_TABLET | Freq: Every day | ORAL | Status: DC
  Administered 2015-06-28 – 2015-06-30 (×3): 750 mg via ORAL
  Filled 2015-06-27 (×3): qty 1

## 2015-06-27 MED ORDER — GUAIFENESIN-DM 100-10 MG/5ML PO SYRP: 5.0000 mL | ORAL_SOLUTION | ORAL | Status: DC | PRN

## 2015-06-27 MED ORDER — ASPIRIN EC 325 MG PO TBEC
325.0000 mg | DELAYED_RELEASE_TABLET | Freq: Every day | ORAL | Status: DC
  Administered 2015-06-27 – 2015-06-30 (×4): 325 mg via ORAL
  Filled 2015-06-27 (×4): qty 1

## 2015-06-27 MED ORDER — ESCITALOPRAM OXALATE 10 MG PO TABS
10.0000 mg | ORAL_TABLET | Freq: Every evening | ORAL | Status: DC
  Administered 2015-06-27 – 2015-06-29 (×3): 10 mg via ORAL
  Filled 2015-06-27 (×3): qty 1

## 2015-06-27 MED ORDER — DILTIAZEM LOAD VIA INFUSION
10.0000 mg | Freq: Once | INTRAVENOUS | Status: AC
  Administered 2015-06-27: 10 mg via INTRAVENOUS
  Filled 2015-06-27: qty 10

## 2015-06-27 MED ORDER — DILTIAZEM HCL 100 MG IV SOLR
5.0000 mg/h | INTRAVENOUS | Status: DC
  Administered 2015-06-27: 5 mg/h via INTRAVENOUS
  Administered 2015-06-27 – 2015-06-28 (×2): 10 mg/h via INTRAVENOUS
  Filled 2015-06-27: qty 100

## 2015-06-27 MED ORDER — SODIUM CHLORIDE 0.9% FLUSH
3.0000 mL | Freq: Two times a day (BID) | INTRAVENOUS | Status: DC
  Administered 2015-06-27 – 2015-06-30 (×6): 3 mL via INTRAVENOUS

## 2015-06-27 MED ORDER — ALBUTEROL SULFATE (2.5 MG/3ML) 0.083% IN NEBU: 2.5000 mg | INHALATION_SOLUTION | RESPIRATORY_TRACT | Status: DC | PRN

## 2015-06-27 MED ORDER — ACETAMINOPHEN 650 MG RE SUPP: 650.0000 mg | Freq: Four times a day (QID) | RECTAL | Status: DC | PRN

## 2015-06-27 MED ORDER — GUAIFENESIN ER 600 MG PO TB12
600.0000 mg | ORAL_TABLET | Freq: Two times a day (BID) | ORAL | Status: DC
  Administered 2015-06-27 – 2015-06-30 (×6): 600 mg via ORAL
  Filled 2015-06-27 (×6): qty 1

## 2015-06-27 NOTE — ED Notes (Signed)
SOB increasing over last several days. Speaks complete sentences without difficulty. Reports taking 1 breathing tx prior arrival to ER

## 2015-06-27 NOTE — ED Provider Notes (Signed)
CSN: [id number]     Arrival date & time 06/27/15  1314 History   First MD Initiated Contact with Patient 06/27/15 1325     Chief Complaint  Patient presents with  . Shortness of Breath    Patient is a 65 y.o. female presenting with shortness of breath. The history is provided by the patient.  Shortness of Breath Severity:  Moderate Onset quality:  Gradual Duration:  2 days Timing:  Intermittent Progression:  Worsening Chronicity:  New Relieved by:  Nothing Worsened by:  Activity Associated symptoms: cough   Associated symptoms: no chest pain, no fever, no syncope and no vomiting   Risk factors: tobacco use   Patient with h/o COPD who presents with increasing SOB over past 2 days No CP She reports mild cough without hemoptysis She has used nebulizer at home but no improvement She denies h/o CAD/CVA/PE She is a smoker    Past Medical History  Diagnosis Date  . COPD (chronic obstructive pulmonary disease) ([place])   . Fibromyalgia   . Hypertension   . Chronic right hip pain    Past Surgical History  Procedure Laterality Date  . Neck surgery    . Tonsillectomy    . Tubal ligation     No family history on file. Social History  Substance Use Topics  . Smoking status: Current Every Day Smoker  . Smokeless tobacco: None  . Alcohol Use: No   OB History    No data available     Review of Systems  Constitutional: Positive for fatigue. Negative for fever.  Respiratory: Positive for cough and shortness of breath.   Cardiovascular: Negative for chest pain and syncope.  Gastrointestinal: Negative for vomiting.  Neurological: Negative for syncope.  All other systems reviewed and are negative.     Allergies  Ibuprofen; Penicillins; Tiotropium bromide monohydrate; Tramadol; and Vicodin  Home Medications   Prior to Admission medications   Medication Sig Start Date End Date Taking? Authorizing Provider  albuterol (VENTOLIN HFA) 108 (90 BASE) MCG/ACT inhaler Inhale 2  puffs into the lungs 2 (two) times daily. For shortness of breath    Historical Provider, MD  albuterol-ipratropium (COMBIVENT) 18-103 MCG/ACT inhaler Inhale 2 puffs into the lungs 2 (two) times daily. For shortness of breath    Historical Provider, MD  beclomethasone (QVAR) 40 MCG/ACT inhaler Inhale 2 puffs into the lungs 2 (two) times daily.    Historical Provider, MD  carvedilol (COREG) 3.125 MG tablet Take 3.125 mg by mouth 2 (two) times daily with a meal.      Historical Provider, MD  clindamycin (CLEOCIN) 150 MG capsule 1 po tid with food 10/01/12   Lily Kocher, PA-C  diclofenac (VOLTAREN) 75 MG EC tablet Take 1 tablet (75 mg total) by mouth 2 (two) times daily. 10/01/12   Lily Kocher, PA-C  escitalopram (LEXAPRO) 10 MG tablet Take 10 mg by mouth every evening.     Historical Provider, MD  felodipine (PLENDIL) 10 MG 24 hr tablet Take 10 mg by mouth daily.     Historical Provider, MD  HYDROcodone-acetaminophen (NORCO/VICODIN) 5-325 MG tablet Take 1 tablet by mouth every 4 (four) hours as needed for moderate pain (Must last 30 days.  Do not take and drive a car or use machinery.). 06/21/15   Sanjuana Kava, MD  meloxicam (MOBIC) 7.5 MG tablet Take 7.5 mg by mouth at bedtime.    Historical Provider, MD  promethazine (PHENERGAN) 25 MG tablet Take 1 tablet (25 mg total) by  mouth every 6 (six) hours as needed for nausea. 10/01/12   Lily Kocher, PA-C   BP 120/100 mmHg  Pulse 148  Temp(Src) 97.6 F (36.4 C)  Resp 28  SpO2 100% Physical Exam CONSTITUTIONAL: Disheveled, no acute distress HEAD: Normocephalic/atraumatic EYES: EOMI ENMT: Mucous membranes moist NECK: supple no meningeal signs SPINE/BACK:entire spine nontender CV: tachycardic, irregular, no loud murmurs LUNGS: crackles in the bases, no wheezing or distress noted ABDOMEN: soft, nontender GU:no cva tenderness NEURO: Pt is awake/alert/appropriate, moves all extremitiesx4.  EXTREMITIES: pulses normal/equal, full ROM, no LE edema  noted SKIN: warm, color normal PSYCH: no abnormalities of mood noted, alert and oriented to situation  ED Course  Procedures  CRITICAL CARE Performed by: Sharyon Cable Total critical care time: 33 minutes Critical care time was exclusive of separately billable procedures and treating other patients. Critical care was necessary to treat or prevent imminent or life-threatening deterioration. Critical care was time spent personally by me on the following activities: development of treatment plan with patient and/or surrogate as well as nursing, discussions with consultants, evaluation of patient's response to treatment, examination of patient, obtaining history from patient or surrogate, ordering and performing treatments and interventions, ordering and review of laboratory studies, ordering and review of radiographic studies, pulse oximetry and re-evaluation of patient's condition. PATIENT WITH ATRIAL FIBRILLATION/FLUTTER WITH RAPID HEART RATE, HR >145, REQUIRING IV CARDIZEM BOLUS AND IV CARDIZEM DRIP  Labs Review Labs Reviewed  BASIC METABOLIC PANEL - Abnormal; Notable for the following:    CO2 21 (*)    Glucose, Bld 163 (*)    Creatinine, Ser 1.08 (*)    Calcium 8.5 (*)    GFR calc non Af Amer 52 (*)    All other components within normal limits  TROPONIN I - Abnormal; Notable for the following:    Troponin I 0.05 (*)    All other components within normal limits  CBC WITH DIFFERENTIAL/PLATELET    Imaging Review Dg Chest Portable 1 View  06/27/2015  CLINICAL DATA:  65 year old female with increasing shortness of breath for several days. Initial encounter. Smoker. COPD. EXAM: PORTABLE CHEST 1 VIEW COMPARISON:  09/25/2007. FINDINGS: Upright AP view of the chest at 1352 hours. New confluent right lung base opacity. Underlying chronic hyperinflation. Stable mediastinal contours. No pneumothorax or pulmonary edema. No definite pleural effusion. Chronic ACDF hardware in the cervical spine.  No acute osseous abnormality identified. IMPRESSION: Patchy right lung base opacity compatible with acute infectious exacerbation superimposed on chronic lung disease with hyperinflation. No definite pleural effusion. Follow-up PA and lateral views of the chest would be helpful when possible. Electronically Signed   By: Genevie Ann M.D.   On: 06/27/2015 14:05   I have personally reviewed and evaluated these images and lab results as part of my medical decision-making.   EKG Interpretation   Date/Time:  Wednesday Jun 27 2015 13:25:18 EDT Ventricular Rate:  156 PR Interval:    QRS Duration: 102 QT Interval:  317 QTC Calculation: 511 R Axis:   -84 Text Interpretation:  Atrial fibrillation with rapid V-rate Inferior  infarct, old Probable anterior infarct, age indeterminate No previous ECGs  available Confirmed by Christy Gentles  MD, Harish Bram ([id number]) on 06/27/2015 1:33:09  PM     Medications  diltiazem (CARDIZEM) 1 mg/mL load via infusion 10 mg (10 mg Intravenous Given 06/27/15 1413)    And  diltiazem (CARDIZEM) 100 mg in dextrose 5 % 100 mL (1 mg/mL) infusion (5 mg/hr Intravenous New Bag/Given 06/27/15 1413)  dextrose  5 % with diltiazem (CARDIZEM) ADS Med (not administered)  levofloxacin (LEVAQUIN) IVPB 750 mg (not administered)    EKG Interpretation  Date/Time:  Wednesday Jun 27 2015 14:27:25 EDT Ventricular Rate:  119 PR Interval:    QRS Duration: 81 QT Interval:  332 QTC Calculation: 467 R Axis:   -81 Text Interpretation:  Atrial flutter Ventricular premature complex Left anterior fascicular block Anteroseptal infarct, age indeterminate rate is slower on this EKG Confirmed by Christy Gentles  MD, Remi Rester ([id number]) on 06/27/2015 2:50:31 PM      3:09 PM PT WITH NEW ONSET ATRIAL FLUTTER SHE IS IMPROVED WITH IV CARDIZEM SHE ALSO HAS PNEUMONIA WILL ADMIT TO HOSPITAL D/W DR [facility] FOR ADMISSION PATIENT STABILIZED IN THE ER   MDM   Final diagnoses:  Atrial flutter with rapid ventricular response  ([place])  CAP (community acquired pneumonia)    Nursing notes including past medical history and social history reviewed and considered in documentation xrays/imaging reviewed by myself and considered during evaluation Labs/vital reviewed myself and considered during evaluation     Ripley Fraise, MD 06/27/15 1510

## 2015-06-27 NOTE — H&P (Signed)
TRH H&P   Patient Demographics:    Lindsay Salazar, is a 65 y.o. female  MRN: [id number]   DOB - [birth year]  Admit Date - 06/27/2015  Outpatient Primary MD for the patient is Vesta Mixer  Referring MD: Dr. Christy Gentles  Outpatient Specialists:  None  Patient coming from: Home  Chief Complaint  Patient presents with  . Shortness of Breath      HPI:    Lindsay Salazar  is a 65 y.o. female, With history of COPD not on home O2, ongoing active tobacco use (one and half pack per day), hypertension, fibromyalgia who presented to the ED with progressive shortness of breath for past 2 days. Patient reports worsening dyspnea on exertion associated with cough with whitish phlegm. She reports having some chills but no fever. She took her home inhaler and nebulizer without much relief. She denies any headache, blurred vision, nausea, vomiting, chest pain, palpitations, orthopnea or PND. Dyspnea worsened and patient was short of breath even at rest. Patient denies any abdominal pain, dysuria, bowel symptoms. Denies any travel. Denies recent illness or sick contact. Denies any weakness or numbness.  Course in the ED Patient was in rapid A. fib and had elevated blood pressure (152/105 mmHg). She was also tachypneic. Blood work showed normal CBC and chemistry. Troponin was mildly elevated to 0.05. Blood glucose of 163. Chest x-ray showed patchy right lung base opacity suggestive of acute infection.  EKG showed A. fib with RVR at 119  and T-wave inversion in lateral leads  Patient given 10 milium IV Cardizem and started on Cardizem drip . She was also given a dose of IV Levaquin. Hospitalist admission requested to stepdown unit.     Review of systems:    In addition to the HPI above,  No Fever, Chills+ No Headache, No changes with Vision or hearing, No problems swallowing food or  Liquids, No Chest pain, Cough and  Shortness of Breath++, No Abdominal pain, No Nausea or Vommitting, Bowel movements are regular, No Blood in stool or Urine, No dysuria, No new skin rashes or bruises, No new joints pains-aches,  No new weakness, tingling, numbness in any extremity, No recent weight gain or loss, No polyuria, polydypsia or polyphagia, No significant Mental Stressors.  A full 10 point Review of Systems was done, except as stated above, all other Review of Systems were negative.   With Past History of the following :    Past Medical History  Diagnosis Date  . COPD (chronic obstructive pulmonary disease) ([place])   . Fibromyalgia   . Hypertension   . Chronic right hip pain       Past Surgical History  Procedure Laterality Date  . Neck surgery    . Tonsillectomy    . Tubal ligation        Social History:     Social  History  Substance Use Topics  . Smoking status: Current Every Day Smoker  . Smokeless tobacco: Not on file  . Alcohol Use: No     Lives - with her boyfrind  Mobility -independent     Family History :   Family history of heart disease or diabetes.   Home Medications:   Prior to Admission medications   Medication Sig Start Date End Date Taking? Authorizing Provider  albuterol (PROVENTIL) (2.5 MG/3ML) 0.083% nebulizer solution Take 2.5 mg by nebulization every 6 (six) hours as needed for wheezing or shortness of breath.   Yes Historical Provider, MD  albuterol (VENTOLIN HFA) 108 (90 BASE) MCG/ACT inhaler Inhale 2 puffs into the lungs 2 (two) times daily. For shortness of breath   Yes Historical Provider, MD  albuterol-ipratropium (COMBIVENT) 18-103 MCG/ACT inhaler Inhale 2 puffs into the lungs 2 (two) times daily. For shortness of breath   Yes Historical Provider, MD  budesonide-formoterol (SYMBICORT) 80-4.5 MCG/ACT inhaler Inhale 2 puffs into the lungs 2 (two) times daily.   Yes Historical Provider, MD  carvedilol (COREG) 3.125 MG  tablet Take 3.125 mg by mouth 2 (two) times daily with a meal.     Yes Historical Provider, MD  escitalopram (LEXAPRO) 10 MG tablet Take 10 mg by mouth every evening.    Yes Historical Provider, MD  HYDROcodone-acetaminophen (NORCO/VICODIN) 5-325 MG tablet Take 1 tablet by mouth every 4 (four) hours as needed for moderate pain (Must last 30 days.  Do not take and drive a car or use machinery.). 06/21/15  Yes Sanjuana Kava, MD  meloxicam (MOBIC) 7.5 MG tablet Take 7.5 mg by mouth at bedtime.   Yes Historical Provider, MD     Allergies:     Allergies  Allergen Reactions  . Ibuprofen Nausea And Vomiting  . Penicillins Swelling  . Tiotropium Bromide Monohydrate Itching  . Tramadol Nausea And Vomiting  . Vicodin [Hydrocodone-Acetaminophen] Nausea And Vomiting     Physical Exam:   Vitals  Blood pressure 139/94, pulse 62, temperature 97.6 F (36.4 C), resp. rate 26, SpO2 100 %.    General : Elderly female not in distress, able to speak in full sentences HEENT: No pallor, moist mucosa, no JVD, no cervical lymphadenopathy, supple neck Chest: Diffuse scattered rhonchi bilaterally, no crackles CVS: S1 and S2 irregular, no murmurs rub gallop GI: Soft, nondistended, nontender, bowel sounds present Musculoskeletal: Warm, no edema CNS: Alert and oriented      Data Review:    CBC  Recent Labs Lab 06/27/15 1330  WBC 9.4  HGB 14.0  HCT 42.9  PLT 251  MCV 95.3  MCH 31.1  MCHC 32.6  RDW 14.5  LYMPHSABS 1.8  MONOABS 0.7  EOSABS 0.0  BASOSABS 0.0   ------------------------------------------------------------------------------------------------------------------  Chemistries   Recent Labs Lab 06/27/15 1330  NA 136  K 3.9  CL 103  CO2 21*  GLUCOSE 163*  BUN 20  CREATININE 1.08*  CALCIUM 8.5*   ------------------------------------------------------------------------------------------------------------------ CrCl cannot be calculated (Unknown ideal  weight.). ------------------------------------------------------------------------------------------------------------------ No results for input(s): TSH, T4TOTAL, T3FREE, THYROIDAB in the last 72 hours.  Invalid input(s): FREET3  Coagulation profile No results for input(s): INR, PROTIME in the last 168 hours. ------------------------------------------------------------------------------------------------------------------- No results for input(s): DDIMER in the last 72 hours. -------------------------------------------------------------------------------------------------------------------  Cardiac Enzymes  Recent Labs Lab 06/27/15 1330  TROPONINI 0.05*   ------------------------------------------------------------------------------------------------------------------ No results found for: BNP   ---------------------------------------------------------------------------------------------------------------  Urinalysis No results found for: COLORURINE, APPEARANCEUR, LABSPEC, PHURINE, GLUCOSEU, HGBUR, BILIRUBINUR, KETONESUR, PROTEINUR, UROBILINOGEN, NITRITE,  LEUKOCYTESUR  ----------------------------------------------------------------------------------------------------------------   Imaging Results:    Dg Chest Portable 1 View  06/27/2015  CLINICAL DATA:  65 year old female with increasing shortness of breath for several days. Initial encounter. Smoker. COPD. EXAM: PORTABLE CHEST 1 VIEW COMPARISON:  09/25/2007. FINDINGS: Upright AP view of the chest at 1352 hours. New confluent right lung base opacity. Underlying chronic hyperinflation. Stable mediastinal contours. No pneumothorax or pulmonary edema. No definite pleural effusion. Chronic ACDF hardware in the cervical spine. No acute osseous abnormality identified. IMPRESSION: Patchy right lung base opacity compatible with acute infectious exacerbation superimposed on chronic lung disease with hyperinflation. No definite pleural  effusion. Follow-up PA and lateral views of the chest would be helpful when possible. Electronically Signed   By: Genevie Ann M.D.   On: 06/27/2015 14:05    My personal review of EKG: A. fib with RVR at 119, T-wave inversion in lateral leads,   Assessment & Plan:    Principal Problem:   Atrial fibrillation with RVR ([place]) New onset. Likely triggered by COPD exacerbation and uncontrolled hypertension. Admit to telemetry. Started on low-dose Cardizem drip. Heart rate in the range of 95-110 during my evaluation. Check TSH, 2-D echo. Cycle serial troponin. Patient's CHADSVasc2 score is 3. She would benefit from being on anticoagulation. i have started her on aspirin. If her insurance approves she can be started on NOAC otherwise on warfarin.  Depending upon symptom improvement, echo finding and hospital course please decide on involving cardiology.  Active Problems:   COPD with exacerbation (HCC) IV sodium Medrol 80 mg every 8 hours, scheduled DuoNeb, when necessary albuterol nebs, antitussives and flutter valve. Counseled strongly on smoking cessation. Added nicotine patch.  Right Lobar pneumonia Ordered blood culture, urine for strep antigen and sputum culture. Placed on empiric Levaquin.  Uncontrolled hypertension Resume home dose Coreg. On Cardizem drip. We will add when necessary hydralazine.  Elevated troponin Possibly demand ischemia with rapid A. fib and pneumonia. Denies chest pain symptoms. Cycle enzymes and repeat EKG in the morning.  Tobacco abuse Counseled strongly ounces patient. Nicotine patch ordered   DVT Prophylaxis : Subcutaneous Lovenox  AM Labs Ordered, also please review Full Orders  Family Communication: Discussed with patient at bedside.  Code Status full code  Likely DC to  home  Condition : Fair  Consults called: None   Admission status: Inpatient  Time spent in minutes : 70   Louellen Molder M.D on 06/27/2015 at 3:39 PM  Between 7am to 7pm -  Pager - [phone]. After 7pm go to [email or web address] - password [facility]  [facility] - Office  [phone]

## 2015-06-27 NOTE — ED Notes (Signed)
HR assessed Afib 140-150s.

## 2015-06-27 NOTE — ED Notes (Signed)
Upon taking pt to ICU, nurse noticed right hand hematoma above IV site. Nurse stopped infusion. Pt had blood drawn less than 5 minutes before nurse found pt. Blood was drawn from around 1 inch above IV insertion site that Levaquin was being administered. Nurse discontinued IV and placed another secondary IV on patients left forearm. ICU made aware of discovery.

## 2015-06-28 ENCOUNTER — Inpatient Hospital Stay (HOSPITAL_COMMUNITY): Payer: Medicare HMO

## 2015-06-28 DIAGNOSIS — I160 Hypertensive urgency: Secondary | ICD-10-CM

## 2015-06-28 DIAGNOSIS — J441 Chronic obstructive pulmonary disease with (acute) exacerbation: Principal | ICD-10-CM

## 2015-06-28 DIAGNOSIS — R7989 Other specified abnormal findings of blood chemistry: Secondary | ICD-10-CM

## 2015-06-28 DIAGNOSIS — I4891 Unspecified atrial fibrillation: Secondary | ICD-10-CM

## 2015-06-28 LAB — BASIC METABOLIC PANEL
ANION GAP: 7 (ref 5–15)
BUN: 18 mg/dL (ref 6–20)
CALCIUM: 8.4 mg/dL — AB (ref 8.9–10.3)
CO2: 20 mmol/L — ABNORMAL LOW (ref 22–32)
Chloride: 106 mmol/L (ref 101–111)
Creatinine, Ser: 1.04 mg/dL — ABNORMAL HIGH (ref 0.44–1.00)
GFR, EST NON AFRICAN AMERICAN: 54 mL/min — AB (ref >60)
Glucose, Bld: 170 mg/dL — ABNORMAL HIGH (ref 65–99)
Potassium: 4.2 mmol/L (ref 3.5–5.1)
Sodium: 133 mmol/L — ABNORMAL LOW (ref 135–145)

## 2015-06-28 LAB — TROPONIN I
Troponin I: 0.03 ng/mL (ref <0.031)
Troponin I: 0.03 ng/mL (ref <0.031)

## 2015-06-28 LAB — MRSA PCR SCREENING: MRSA BY PCR: NEGATIVE

## 2015-06-28 LAB — ECHOCARDIOGRAM COMPLETE: Weight: 2134.05 oz

## 2015-06-28 MED ORDER — DILTIAZEM HCL 30 MG PO TABS
30.0000 mg | ORAL_TABLET | Freq: Two times a day (BID) | ORAL | Status: DC
  Administered 2015-06-28 – 2015-06-29 (×3): 30 mg via ORAL
  Filled 2015-06-28 (×3): qty 1

## 2015-06-28 MED ORDER — METHYLPREDNISOLONE SODIUM SUCC 125 MG IJ SOLR
60.0000 mg | Freq: Two times a day (BID) | INTRAMUSCULAR | Status: DC
  Administered 2015-06-28 – 2015-06-30 (×4): 60 mg via INTRAVENOUS
  Filled 2015-06-28 (×4): qty 2

## 2015-06-28 NOTE — Care Management Note (Signed)
Case Management Note  Patient Details  Name: SENIA MOCK MRN: [id number] Date of Birth: [birth year]  Subjective/Objective:                  Pt is from home, lives with boyfriend and his brother. Pt is ind with ADL's. Pt has PCP, transportation and does have difficulty affording her medications. Pt states her credit line at her pharmacy has been frozen. Pt has [place] but per pharmacy her medicaid is not 'dual eligible' and will not cover medications, her meds must be covered under her [employer]. Pt has attempted to contact her DSS Education officer, [employer]. Pt has no home O2, no neb machine and no DME for mobility assistance. Pt has no HH services. Pt has A-fib and NOAC vs coumadin is being considered. Pt will have a $37 co-pay for both Eliquis and Xeralto. Pt states she does not want to be on a blood thinner, she bleeds to much now. Risks vs benefits discussed with pt and pt encouraged to discuss this further with attending.   Action/Plan: Will cont to follow.   Expected Discharge Date:      06/29/2015            Expected Discharge Plan:  Home/Self Care  In-House Referral:  NA  Discharge planning Services  CM Consult  Post Acute Care Choice:  Home Health Choice offered to:  NA  DME Arranged:    DME Agency:     HH Arranged:    HH Agency:     Status of Service:  In process, will continue to follow  Medicare Important Message Given:    Date Medicare IM Given:    Medicare IM give by:    Date Additional Medicare IM Given:    Additional Medicare Important Message give by:     If discussed at [place] of Stay Meetings, dates discussed:    Additional Comments:  Sherald Barge, RN 06/28/2015, 2:57 PM

## 2015-06-28 NOTE — Clinical Social Work Note (Signed)
CSW received consult for medication assistance. CM notified. CSW will sign off, but can be reconsulted.  Benay Pike, [place]

## 2015-06-28 NOTE — Progress Notes (Signed)
PROGRESS NOTE    Lindsay Salazar  [id number] DOB: [birth year] DOA: 06/27/2015 PCP: [name], PA-C   Brief Narrative:  65 y.o. female, With history of COPD not on home O2, ongoing active tobacco use (one and half pack per day), hypertension, fibromyalgia who presented to the ED with progressive shortness of breath for past 2 days prior to admission. Patient was found to be in afib with RVR   Assessment & Plan:   Principal Problem:   Atrial fibrillation with RVR (HCC) Active Problems:   Atrial flutter ([place])   COPD with exacerbation ([place])   Hypertensive urgency   Tobacco abuse   Elevated troponin    Atrial fibrillation with RVR ([place]) -New onset. Likely triggered by COPD exacerbation and uncontrolled hypertension.  -Started on low-dose Cardizem drip -TSH normal -Serial trop unremarkable. Pt without chest pain -Patient's CHADSVasc2 score is 3. She would probably benefit from being on anticoagulation. Currently on aspirin.  -If her insurance approves she can be started on NOAC otherwise on warfarin.  -Depending upon symptom improvement, echo finding and hospital course please decide on involving cardiology. -Heart rate controlled. Transition to PO cardizem   COPD with exacerbation ([place]) - Had been on IV SoluMedrol 80 mg every 8 hours, scheduled DuoNeb, when necessary albuterol nebs, antitussives and flutter valve. -Counseled strongly on smoking cessation. Added nicotine patch. - Clinically improved. Will begin weaning steroids  Right Lobar pneumonia -On empiric Levaquin.  Uncontrolled hypertension - Resumed home dose Coreg. - Cardizem per above - BP improved  Elevated troponin - Peak trop of 0.04, down to 0.03. Possibly demand ischemia with rapid A. fib and pneumonia.  -Denies chest pain symptoms.  Tobacco abuse - Counseled strongly ounces patient. Nicotine patch ordered   DVT prophylaxis: Lovenox subQ Code Status: Full Family Communication: Pt in room,  husband at bedside Disposition Plan: Possible d/c in 24-48hrs home   Consultants:     Procedures:     Antimicrobials:  Anti-infectives    Start     Dose/Rate Route Frequency Ordered Stop   06/28/15 1000  levofloxacin (LEVAQUIN) tablet 750 mg     750 mg Oral Daily 06/27/15 1640     06/27/15 1445  levofloxacin (LEVAQUIN) IVPB 750 mg     750 mg 100 mL/hr over 90 Minutes Intravenous  Once 06/27/15 1432 06/27/15 1649          Subjective: Feels better today. Much less sob  Objective: Filed Vitals:   06/28/15 0816 06/28/15 1048 06/28/15 1204 06/28/15 1208  BP:  112/76    Pulse:      Temp: 97 F (36.1 C)   98.1 F (36.7 C)  TempSrc: Oral   Oral  Resp:      Weight:      SpO2:   97%     Intake/Output Summary (Last 24 hours) at 06/28/15 1502 Last data filed at 06/28/15 0200  Gross per 24 hour  Intake 243.83 ml  Output    450 ml  Net -206.17 ml   Filed Weights   06/28/15 0647  Weight: 60.5 kg (133 lb 6.1 oz)    Examination:  General exam: Appears calm and comfortable  Respiratory system: Clear to auscultation. Respiratory effort normal. Cardiovascular system: S1 & S2 heard, RRR.  Gastrointestinal system: Abdomen is nondistended, soft and nontender. No organomegaly or masses felt. Normal bowel sounds heard. Central nervous system: Alert and oriented. No focal neurological deficits. Extremities: Symmetric 5 x 5 power. Skin: No rashes, lesions Psychiatry: Judgement  and insight appear normal. Mood & affect appropriate.     Data Reviewed: I have personally reviewed following labs and imaging studies  CBC:  Recent Labs Lab 06/27/15 1330  WBC 9.4  NEUTROABS 6.9  HGB 14.0  HCT 42.9  MCV 95.3  PLT 123XX123   Basic Metabolic Panel:  Recent Labs Lab 06/27/15 1330 06/28/15 0257  NA 136 133*  K 3.9 4.2  CL 103 106  CO2 21* 20*  GLUCOSE 163* 170*  BUN 20 18  CREATININE 1.08* 1.04*  CALCIUM 8.5* 8.4*  MG 1.8  --    GFR: Estimated Creatinine  Clearance: 45.3 mL/min (by C-G formula based on Cr of 1.04). Liver Function Tests: No results for input(s): AST, ALT, ALKPHOS, BILITOT, PROT, ALBUMIN in the last 168 hours. No results for input(s): LIPASE, AMYLASE in the last 168 hours. No results for input(s): AMMONIA in the last 168 hours. Coagulation Profile: No results for input(s): INR, PROTIME in the last 168 hours. Cardiac Enzymes:  Recent Labs Lab 06/27/15 1330 06/27/15 2056 06/28/15 0257 06/28/15 0913  TROPONINI 0.05* 0.04* 0.03 0.03   BNP (last 3 results) No results for input(s): PROBNP in the last 8760 hours. HbA1C: No results for input(s): HGBA1C in the last 72 hours. CBG: No results for input(s): GLUCAP in the last 168 hours. Lipid Profile: No results for input(s): CHOL, HDL, LDLCALC, TRIG, CHOLHDL, LDLDIRECT in the last 72 hours. Thyroid Function Tests:  Recent Labs  06/27/15 1330  TSH 2.678   Anemia Panel: No results for input(s): VITAMINB12, FOLATE, FERRITIN, TIBC, IRON, RETICCTPCT in the last 72 hours. Sepsis Labs: No results for input(s): PROCALCITON, LATICACIDVEN in the last 168 hours.  Recent Results (from the past 240 hour(s))  Culture, blood (routine x 2)     Status: None (Preliminary result)   Collection Time: 06/27/15  3:49 PM  Result Value Ref Range Status   Specimen Description BLOOD LEFT ANTECUBITAL  Final   Special Requests   Final    BOTTLES DRAWN AEROBIC AND ANAEROBIC AEB=11CC ANA=7CC   Culture NO GROWTH < 24 HOURS  Final   Report Status PENDING  Incomplete  Culture, blood (routine x 2)     Status: None (Preliminary result)   Collection Time: 06/27/15  3:55 PM  Result Value Ref Range Status   Specimen Description BLOOD RIGHT HAND  Final   Special Requests BOTTLES DRAWN AEROBIC AND ANAEROBIC 6CC EACH  Final   Culture NO GROWTH < 24 HOURS  Final   Report Status PENDING  Incomplete  MRSA PCR Screening     Status: None   Collection Time: 06/27/15  6:00 PM  Result Value Ref Range Status     MRSA by PCR NEGATIVE NEGATIVE Final    Comment:        The GeneXpert MRSA Assay (FDA approved for NASAL specimens only), is one component of a comprehensive MRSA colonization surveillance program. It is not intended to diagnose MRSA infection nor to guide or monitor treatment for MRSA infections.          Radiology Studies: Dg Chest Portable 1 View  06/27/2015  CLINICAL DATA:  65 year old female with increasing shortness of breath for several days. Initial encounter. Smoker. COPD. EXAM: PORTABLE CHEST 1 VIEW COMPARISON:  09/25/2007. FINDINGS: Upright AP view of the chest at 1352 hours. New confluent right lung base opacity. Underlying chronic hyperinflation. Stable mediastinal contours. No pneumothorax or pulmonary edema. No definite pleural effusion. Chronic ACDF hardware in the cervical spine. No acute  osseous abnormality identified. IMPRESSION: Patchy right lung base opacity compatible with acute infectious exacerbation superimposed on chronic lung disease with hyperinflation. No definite pleural effusion. Follow-up PA and lateral views of the chest would be helpful when possible. Electronically Signed   By: Genevie Ann M.D.   On: 06/27/2015 14:05        Scheduled Meds: . antiseptic oral rinse  7 mL Mouth Rinse BID  . aspirin EC  325 mg Oral Daily  . carvedilol  3.125 mg Oral BID WC  . diltiazem  30 mg Oral Q12H  . enoxaparin (LOVENOX) injection  40 mg Subcutaneous Q24H  . escitalopram  10 mg Oral QPM  . guaiFENesin  600 mg Oral BID  . ipratropium-albuterol  3 mL Nebulization Q4H  . levofloxacin  750 mg Oral Daily  . meloxicam  7.5 mg Oral QHS  . methylPREDNISolone (SOLU-MEDROL) injection  60 mg Intravenous Q12H  . nicotine  21 mg Transdermal Daily  . sodium chloride flush  3 mL Intravenous Q12H   Continuous Infusions: . diltiazem (CARDIZEM) infusion 10 mg/hr (06/28/15 0247)  . diltiazem (CARDIZEM) infusion Stopped (06/28/15 1222)     LOS: 1 day    CHIU, Orpah Melter,  MD [facility] Pager [phone]  If 7PM-7AM, please contact night-coverage [email or web address] Password [email or web address] 06/28/2015, 3:02 PM

## 2015-06-29 LAB — BASIC METABOLIC PANEL
Anion gap: 7 (ref 5–15)
BUN: 36 mg/dL — AB (ref 6–20)
CHLORIDE: 105 mmol/L (ref 101–111)
CO2: 22 mmol/L (ref 22–32)
CREATININE: 1.16 mg/dL — AB (ref 0.44–1.00)
Calcium: 8.6 mg/dL — ABNORMAL LOW (ref 8.9–10.3)
GFR calc non Af Amer: 48 mL/min — ABNORMAL LOW (ref >60)
GFR, EST AFRICAN AMERICAN: 55 mL/min — AB (ref >60)
GLUCOSE: 154 mg/dL — AB (ref 65–99)
Potassium: 4.2 mmol/L (ref 3.5–5.1)
Sodium: 134 mmol/L — ABNORMAL LOW (ref 135–145)

## 2015-06-29 MED ORDER — MELOXICAM 7.5 MG PO TABS
ORAL_TABLET | ORAL | Status: AC
  Filled 2015-06-29: qty 1

## 2015-06-29 MED ORDER — LABETALOL HCL 5 MG/ML IV SOLN: 10.0000 mg | INTRAVENOUS | Status: DC | PRN

## 2015-06-29 MED ORDER — RIVAROXABAN 15 MG PO TABS
15.0000 mg | ORAL_TABLET | Freq: Every day | ORAL | Status: DC
  Administered 2015-06-29: 15 mg via ORAL
  Filled 2015-06-29: qty 1

## 2015-06-29 MED ORDER — OFF THE BEAT BOOK
Freq: Once | Status: AC
  Administered 2015-06-29: 1
  Filled 2015-06-29: qty 1

## 2015-06-29 MED ORDER — DILTIAZEM HCL 30 MG PO TABS
30.0000 mg | ORAL_TABLET | Freq: Once | ORAL | Status: AC
  Administered 2015-06-29: 30 mg via ORAL
  Filled 2015-06-29: qty 1

## 2015-06-29 MED ORDER — DILTIAZEM HCL 60 MG PO TABS
60.0000 mg | ORAL_TABLET | Freq: Three times a day (TID) | ORAL | Status: DC
  Administered 2015-06-29 – 2015-06-30 (×4): 60 mg via ORAL
  Filled 2015-06-29 (×4): qty 1

## 2015-06-29 MED ORDER — IPRATROPIUM-ALBUTEROL 0.5-2.5 (3) MG/3ML IN SOLN
3.0000 mL | Freq: Three times a day (TID) | RESPIRATORY_TRACT | Status: DC
  Administered 2015-06-29 – 2015-06-30 (×3): 3 mL via RESPIRATORY_TRACT
  Filled 2015-06-29 (×3): qty 3

## 2015-06-29 NOTE — Progress Notes (Signed)
Called report to Janace Aris, RN on dept 300. Verbalized understanding. Pt to be transferred to room 323 when bed ready.

## 2015-06-29 NOTE — Progress Notes (Signed)
Bed ready at this time per nursing secretary. Patient transferred to floor via wheelchair.

## 2015-06-29 NOTE — Care Management Important Message (Signed)
Important Message  Patient Details  Name: Lindsay Salazar MRN: [id number] Date of Birth: 1950/04/09   Medicare Important Message Given:  Yes    Sherald Barge, RN 06/29/2015, 10:30 AM

## 2015-06-29 NOTE — Progress Notes (Signed)
ANTICOAGULATION CONSULT NOTE - Initial Consult  Pharmacy Consult for xarelto Indication: atrial fibrillation  Allergies  Allergen Reactions  . Ibuprofen Nausea And Vomiting  . Penicillins Swelling  . Tiotropium Bromide Monohydrate Itching  . Tramadol Nausea And Vomiting  . Vicodin [Hydrocodone-Acetaminophen] Nausea And Vomiting    Patient Measurements: Height: 5\' 4"  (162.6 cm) Weight: 134 lb 14.7 oz (61.2 kg) IBW/kg (Calculated) : 54.7   Vital Signs: Temp: 97.2 F (36.2 C) (05/26 1200) Temp Source: Oral (05/26 1200) BP: 143/83 mmHg (05/26 1404) Pulse Rate: 95 (05/26 1403)  Labs:  Recent Labs  06/27/15 1330 06/27/15 2056 06/28/15 0257 06/28/15 0913 06/29/15 0417  HGB 14.0  --   --   --   --   HCT 42.9  --   --   --   --   PLT 251  --   --   --   --   CREATININE 1.08*  --  1.04*  --  1.16*  TROPONINI 0.05* 0.04* 0.03 0.03  --     Estimated Creatinine Clearance: 40.6 mL/min (by C-G formula based on Cr of 1.16).   Medical History: Past Medical History  Diagnosis Date  . COPD (chronic obstructive pulmonary disease) ([place])   . Fibromyalgia   . Hypertension   . Chronic right hip pain     Medications:  Prescriptions prior to admission  Medication Sig Dispense Refill Last Dose  . albuterol (PROVENTIL) (2.5 MG/3ML) 0.083% nebulizer solution Take 2.5 mg by nebulization every 6 (six) hours as needed for wheezing or shortness of breath.   06/27/2015 at Unknown time  . albuterol (VENTOLIN HFA) 108 (90 BASE) MCG/ACT inhaler Inhale 2 puffs into the lungs 2 (two) times daily. For shortness of breath   Past Week at Unknown time  . albuterol-ipratropium (COMBIVENT) 18-103 MCG/ACT inhaler Inhale 2 puffs into the lungs 2 (two) times daily. For shortness of breath   06/27/2015 at Unknown time  . budesonide-formoterol (SYMBICORT) 80-4.5 MCG/ACT inhaler Inhale 2 puffs into the lungs 2 (two) times daily.   06/27/2015 at Unknown time  . carvedilol (COREG) 3.125 MG tablet Take 3.125 mg  by mouth 2 (two) times daily with a meal.     06/27/2015 at 1100  . escitalopram (LEXAPRO) 10 MG tablet Take 10 mg by mouth every evening.    06/26/2015 at Unknown time  . HYDROcodone-acetaminophen (NORCO/VICODIN) 5-325 MG tablet Take 1 tablet by mouth every 4 (four) hours as needed for moderate pain (Must last 30 days.  Do not take and drive a car or use machinery.). 120 tablet 0 06/26/2015 at Unknown time  . meloxicam (MOBIC) 7.5 MG tablet Take 7.5 mg by mouth at bedtime.   06/26/2015 at Unknown time    Assessment: 65 yo lady to start xarelto for afib.  She was not on anticoagulation previously. Goal of Therapy:  Appropriate anticoagulation dose Monitor platelets by anticoagulation protocol: Yes   Plan:  Xarelto 15 mg po daily D/c lovenox Monitor for bleeding complications  Lindsay Salazar 06/29/2015,4:19 PM

## 2015-06-29 NOTE — Care Management Note (Signed)
Case Management Note  Patient Details  Name: Lindsay Salazar MRN: [id number] Date of Birth: Apr 16, 1950    Expected Discharge Date:     06/30/2015             Expected Discharge Plan:  Home/Self Care  In-House Referral:  NA  Discharge planning Services  CM Consult  Post Acute Care Choice:  Home Health Choice offered to:  NA  DME Arranged:    DME Agency:     HH Arranged:    [place] Agency:     Status of Service:  Completed, signed off  Medicare Important Message Given:  Yes Date Medicare IM Given:    Medicare IM give by:    Date Additional Medicare IM Given:    Additional Medicare Important Message give by:     If discussed at [place] of Stay Meetings, dates discussed:    Additional Comments: [place] home over weekend. Discussed with pt her need to get medications filled at pharmacy in pt network. Pt states she will got to [facility]. Pt's cost of medications will greatly decrease moving from [facility] to [place]. Pt is now agreeable to [facility] and will be Rx Xeralto. Pt given 30-day free voucher.   Sherald Barge, RN 06/29/2015, 10:34 AM

## 2015-06-29 NOTE — Progress Notes (Signed)
PROGRESS NOTE    Lindsay Salazar  [id number] DOB: Jun 29, 1950 DOA: 06/27/2015 PCP: [name], PA-C   Brief Narrative:  65 y.o. female, With history of COPD not on home O2, ongoing active tobacco use (one and half pack per day), hypertension, fibromyalgia who presented to the ED with progressive shortness of breath for past 2 days prior to admission. Patient was found to be in afib with RVR   Assessment & Plan:   Principal Problem:   Atrial fibrillation with RVR (HCC) Active Problems:   Atrial flutter ([place])   COPD with exacerbation ([place])   Hypertensive urgency   Tobacco abuse   Elevated troponin    Atrial fibrillation with RVR ([place]) -New onset. Possibly triggered by COPD exacerbation and uncontrolled hypertension.  -Initially sarted on low-dose Cardizem drip -TSH normal -Serial trop unremarkable. Pt without chest pain -Patient's CHADSVasc2 score is 3. She would probably benefit from being on anticoagulation. Currently on aspirin.  -Transitioned to low dose PO cardizem 5/25 -This AM, noted to be tachy to the 120-130's. Will increase cardizem to 60mg  q8hrs   COPD with exacerbation (HCC) - Had been on IV SoluMedrol 80 mg every 8 hours, scheduled DuoNeb, when necessary albuterol nebs, antitussives and flutter valve. -Counseled strongly on smoking cessation. Added nicotine patch. - Clinically improved. Weaning steroids  Right Lobar pneumonia -On empiric Levaquin.  Uncontrolled hypertension - Resumed home dose Coreg. - Cardizem per above - BP improved  Elevated troponin - Peak trop of 0.04, down to 0.03. Possibly demand ischemia with rapid A. fib and pneumonia.  -Denies chest pain symptoms.  Tobacco abuse - Counseled strongly ounces patient. Nicotine patch ordered   DVT prophylaxis: Lovenox subQ Code Status: Full Family Communication: Pt in room, husband at bedside Disposition Plan: Possible d/c in 24-48hrs home   Consultants:     Procedures:      Antimicrobials:  Anti-infectives    Start     Dose/Rate Route Frequency Ordered Stop   06/28/15 1000  levofloxacin (LEVAQUIN) tablet 750 mg     750 mg Oral Daily 06/27/15 1640     06/27/15 1445  levofloxacin (LEVAQUIN) IVPB 750 mg     750 mg 100 mL/hr over 90 Minutes Intravenous  Once 06/27/15 1432 06/27/15 1649          Subjective: Patient reports feeling better today. No complaints.  Objective: Filed Vitals:   06/29/15 1400 06/29/15 1403 06/29/15 1404 06/29/15 1440  BP:  143/83 143/83   Pulse: 87 95    Temp:      TempSrc:      Resp: 25 27    Height:      Weight:      SpO2: 96% 100%  99%    Intake/Output Summary (Last 24 hours) at 06/29/15 1614 Last data filed at 06/29/15 1043  Gross per 24 hour  Intake    240 ml  Output    775 ml  Net   -535 ml   Filed Weights   06/28/15 0647 06/29/15 0400  Weight: 60.5 kg (133 lb 6.1 oz) 61.2 kg (134 lb 14.7 oz)    Examination:  General exam: Appears calm and comfortable, laying in bed Respiratory system: Clear to auscultation. Respiratory effort normal. Cardiovascular system: S1 & S2 heard, RRR.  Gastrointestinal system: Abdomen is nondistended, soft and nontender. No organomegaly or masses felt. Normal bowel sounds heard. Central nervous system: Alert and oriented. No focal neurological deficits. Extremities: Symmetric 5 x 5 power. Skin: No rashes, lesions  Psychiatry: Judgement and insight appear normal. Normal mood & affect    Data Reviewed: I have personally reviewed following labs and imaging studies  CBC:  Recent Labs Lab 06/27/15 1330  WBC 9.4  NEUTROABS 6.9  HGB 14.0  HCT 42.9  MCV 95.3  PLT 123XX123   Basic Metabolic Panel:  Recent Labs Lab 06/27/15 1330 06/28/15 0257 06/29/15 0417  NA 136 133* 134*  K 3.9 4.2 4.2  CL 103 106 105  CO2 21* 20* 22  GLUCOSE 163* 170* 154*  BUN 20 18 36*  CREATININE 1.08* 1.04* 1.16*  CALCIUM 8.5* 8.4* 8.6*  MG 1.8  --   --    GFR: Estimated Creatinine  Clearance: 40.6 mL/min (by C-G formula based on Cr of 1.16). Liver Function Tests: No results for input(s): AST, ALT, ALKPHOS, BILITOT, PROT, ALBUMIN in the last 168 hours. No results for input(s): LIPASE, AMYLASE in the last 168 hours. No results for input(s): AMMONIA in the last 168 hours. Coagulation Profile: No results for input(s): INR, PROTIME in the last 168 hours. Cardiac Enzymes:  Recent Labs Lab 06/27/15 1330 06/27/15 2056 06/28/15 0257 06/28/15 0913  TROPONINI 0.05* 0.04* 0.03 0.03   BNP (last 3 results) No results for input(s): PROBNP in the last 8760 hours. HbA1C: No results for input(s): HGBA1C in the last 72 hours. CBG: No results for input(s): GLUCAP in the last 168 hours. Lipid Profile: No results for input(s): CHOL, HDL, LDLCALC, TRIG, CHOLHDL, LDLDIRECT in the last 72 hours. Thyroid Function Tests:  Recent Labs  06/27/15 1330  TSH 2.678   Anemia Panel: No results for input(s): VITAMINB12, FOLATE, FERRITIN, TIBC, IRON, RETICCTPCT in the last 72 hours. Sepsis Labs: No results for input(s): PROCALCITON, LATICACIDVEN in the last 168 hours.  Recent Results (from the past 240 hour(s))  Culture, blood (routine x 2)     Status: None (Preliminary result)   Collection Time: 06/27/15  3:49 PM  Result Value Ref Range Status   Specimen Description BLOOD LEFT ANTECUBITAL  Final   Special Requests   Final    BOTTLES DRAWN AEROBIC AND ANAEROBIC AEB=11CC ANA=7CC   Culture NO GROWTH 2 DAYS  Final   Report Status PENDING  Incomplete  Culture, blood (routine x 2)     Status: None (Preliminary result)   Collection Time: 06/27/15  3:55 PM  Result Value Ref Range Status   Specimen Description BLOOD RIGHT HAND  Final   Special Requests BOTTLES DRAWN AEROBIC AND ANAEROBIC [place]  Final   Culture NO GROWTH 2 DAYS  Final   Report Status PENDING  Incomplete  MRSA PCR Screening     Status: None   Collection Time: 06/27/15  6:00 PM  Result Value Ref Range Status   MRSA  by PCR NEGATIVE NEGATIVE Final    Comment:        The GeneXpert MRSA Assay (FDA approved for NASAL specimens only), is one component of a comprehensive MRSA colonization surveillance program. It is not intended to diagnose MRSA infection nor to guide or monitor treatment for MRSA infections.          Radiology Studies: No results found.      Scheduled Meds: . antiseptic oral rinse  7 mL Mouth Rinse BID  . aspirin EC  325 mg Oral Daily  . carvedilol  3.125 mg Oral BID WC  . diltiazem  60 mg Oral Q8H  . enoxaparin (LOVENOX) injection  40 mg Subcutaneous Q24H  . escitalopram  10 mg  Oral QPM  . guaiFENesin  600 mg Oral BID  . ipratropium-albuterol  3 mL Nebulization TID  . levofloxacin  750 mg Oral Daily  . meloxicam  7.5 mg Oral QHS  . methylPREDNISolone (SOLU-MEDROL) injection  60 mg Intravenous Q12H  . nicotine  21 mg Transdermal Daily  . sodium chloride flush  3 mL Intravenous Q12H   Continuous Infusions: . diltiazem (CARDIZEM) infusion 10 mg/hr (06/28/15 0247)  . diltiazem (CARDIZEM) infusion Stopped (06/28/15 1222)     LOS: 2 days    Burtis Imhoff, Orpah Melter, MD [facility] Pager [phone]  If 7PM-7AM, please contact night-coverage [email or web address] Password [email or web address] 06/29/2015, 4:14 PM

## 2015-06-30 LAB — BASIC METABOLIC PANEL
ANION GAP: 7 (ref 5–15)
BUN: 34 mg/dL — AB (ref 6–20)
CALCIUM: 8.7 mg/dL — AB (ref 8.9–10.3)
CO2: 23 mmol/L (ref 22–32)
CREATININE: 1.08 mg/dL — AB (ref 0.44–1.00)
Chloride: 105 mmol/L (ref 101–111)
GFR calc Af Amer: >60 mL/min (ref >60)
GFR, EST NON AFRICAN AMERICAN: 52 mL/min — AB (ref >60)
GLUCOSE: 143 mg/dL — AB (ref 65–99)
Potassium: 4.7 mmol/L (ref 3.5–5.1)
Sodium: 135 mmol/L (ref 135–145)

## 2015-06-30 MED ORDER — IPRATROPIUM-ALBUTEROL 0.5-2.5 (3) MG/3ML IN SOLN: 3.0000 mL | Freq: Two times a day (BID) | RESPIRATORY_TRACT | Status: DC

## 2015-06-30 MED ORDER — PREDNISONE 5 MG PO TABS: 5.0000 mg | ORAL_TABLET | Freq: Every day | ORAL | Status: DC

## 2015-06-30 MED ORDER — DILTIAZEM HCL 60 MG PO TABS: 60.0000 mg | ORAL_TABLET | Freq: Three times a day (TID) | ORAL | Status: DC

## 2015-06-30 MED ORDER — LEVOFLOXACIN 750 MG PO TABS: 750.0000 mg | ORAL_TABLET | Freq: Every day | ORAL | Status: DC

## 2015-06-30 MED ORDER — RIVAROXABAN 15 MG PO TABS: 15.0000 mg | ORAL_TABLET | Freq: Every day | ORAL | Status: DC

## 2015-06-30 NOTE — Discharge Summary (Signed)
Physician Discharge Summary  Lindsay Salazar [facility] [id number] DOB: Jun 15, 1950 DOA: 06/27/2015  PCP: [name], PA-C  Admit date: 06/27/2015 Discharge date: 06/30/2015  Time spent: 20 minutes  Recommendations for Outpatient Follow-up:  1. Follow up with PCP in 2-3 weeks 2. Recommend follow up with Cardiology   Discharge Diagnoses:  Principal Problem:   Atrial fibrillation with RVR ([place]) Active Problems:   Atrial flutter ([place])   COPD with exacerbation ([place])   Hypertensive urgency   Tobacco abuse   Elevated troponin   Discharge Condition: Improved  Diet recommendation: Heart healthy  Filed Weights   06/28/15 0647 06/29/15 0400  Weight: 60.5 kg (133 lb 6.1 oz) 61.2 kg (134 lb 14.7 oz)    History of present illness:  Please review dictated H and P from 65 y.o. female, With history of COPD not on home O2, ongoing active tobacco use (one and half pack per day), hypertension, fibromyalgia who presented to the ED with progressive shortness of breath for past 2 days prior to admission. Patient was found to be in afib with RVR  Hospital Course:   Atrial fibrillation with RVR ([place]) -New onset. Possibly triggered by COPD exacerbation and uncontrolled hypertension.  -Initially sarted on low-dose Cardizem drip -TSH normal -Serial trop unremarkable. Pt without chest pain -Patient's CHADSVasc2 score is 3. She would probably benefit from being on anticoagulation. Currently on aspirin.  -Transitioned to low dose PO cardizem 5/25 -Patient's HR remained stable and rate controlled -Recommend follow up with Cardiology as outpatient   COPD with exacerbation ([place]) - Had been on IV SoluMedrol 80 mg every 8 hours, scheduled DuoNeb, when necessary albuterol nebs, antitussives and flutter valve. -Counseled strongly on smoking cessation. Added nicotine patch. - Clinically improved. Weaning prednisone as outpatient  Right Lobar pneumonia -On empiric Levaquin.  Uncontrolled hypertension -  Resumed home dose Coreg. - Cardizem per above - BP improved  Elevated troponin - Peak trop of 0.04, down to 0.03. Possibly demand ischemia with rapid A. fib and pneumonia.  -Denies chest pain symptoms.  Tobacco abuse - Counseled strongly with patient. Nicotine patch ordered  Discharge Exam: Filed Vitals:   06/30/15 0542 06/30/15 0758 06/30/15 0941 06/30/15 1305  BP: 127/85  127/88 125/86  Pulse: 102  80 98  Temp: 98.4 F (36.9 C)     TempSrc: Oral     Resp: 18   20  Height:      Weight:      SpO2:  94% 96% 98%    General: Awake, in nad Cardiovascular: regular, s1, s2 Respiratory: normal resp effort, no wheezing  Discharge Instructions     Medication List    TAKE these medications        albuterol-ipratropium 18-103 MCG/ACT inhaler  Commonly known as:  COMBIVENT  Inhale 2 puffs into the lungs 2 (two) times daily. For shortness of breath     budesonide-formoterol 80-4.5 MCG/ACT inhaler  Commonly known as:  SYMBICORT  Inhale 2 puffs into the lungs 2 (two) times daily.     carvedilol 3.125 MG tablet  Commonly known as:  COREG  Take 3.125 mg by mouth 2 (two) times daily with a meal.     diltiazem 60 MG tablet  Commonly known as:  CARDIZEM  Take 1 tablet (60 mg total) by mouth every 8 (eight) hours.     escitalopram 10 MG tablet  Commonly known as:  LEXAPRO  Take 10 mg by mouth every evening.     HYDROcodone-acetaminophen 5-325 MG tablet  Commonly known as:  NORCO/VICODIN  Take 1 tablet by mouth every 4 (four) hours as needed for moderate pain (Must last 30 days.  Do not take and drive a car or use machinery.).     levofloxacin 750 MG tablet  Commonly known as:  LEVAQUIN  Take 1 tablet (750 mg total) by mouth daily.     meloxicam 7.5 MG tablet  Commonly known as:  MOBIC  Take 7.5 mg by mouth at bedtime.     predniSONE 5 MG tablet  Commonly known as:  DELTASONE  Take 1 tablet (5 mg total) by mouth daily with breakfast.     Rivaroxaban 15 MG Tabs  tablet  Commonly known as:  XARELTO  Take 1 tablet (15 mg total) by mouth daily with supper.     VENTOLIN HFA 108 (90 Base) MCG/ACT inhaler  Generic drug:  albuterol  Inhale 2 puffs into the lungs 2 (two) times daily. For shortness of breath     albuterol (2.5 MG/3ML) 0.083% nebulizer solution  Commonly known as:  PROVENTIL  Take 2.5 mg by nebulization every 6 (six) hours as needed for wheezing or shortness of breath.       Allergies  Allergen Reactions  . Ibuprofen Nausea And Vomiting  . Penicillins Swelling  . Tiotropium Bromide Monohydrate Itching  . Tramadol Nausea And Vomiting  . Vicodin [Hydrocodone-Acetaminophen] Nausea And Vomiting   Follow-up Information    Follow up with BAUCOM, JENNY B, PA-C. Schedule an appointment as soon as possible for a visit in 2 weeks.   Specialty:  Physician Assistant   Why:  Hospital follow up   Contact information:   [address] [place] [address] [phone]       Please follow up.   Why:  Follow up with Cardiology as scheduled       The results of significant diagnostics from this hospitalization (including imaging, microbiology, ancillary and laboratory) are listed below for reference.    Significant Diagnostic Studies: Dg Chest Portable 1 View  06/27/2015  CLINICAL DATA:  65 year old female with increasing shortness of breath for several days. Initial encounter. Smoker. COPD. EXAM: PORTABLE CHEST 1 VIEW COMPARISON:  09/25/2007. FINDINGS: Upright AP view of the chest at 1352 hours. New confluent right lung base opacity. Underlying chronic hyperinflation. Stable mediastinal contours. No pneumothorax or pulmonary edema. No definite pleural effusion. Chronic ACDF hardware in the cervical spine. No acute osseous abnormality identified. IMPRESSION: Patchy right lung base opacity compatible with acute infectious exacerbation superimposed on chronic lung disease with hyperinflation. No definite pleural effusion. Follow-up PA and  lateral views of the chest would be helpful when possible. Electronically Signed   By: Genevie Ann M.D.   On: 06/27/2015 14:05    Microbiology: Recent Results (from the past 240 hour(s))  Culture, blood (routine x 2)     Status: None (Preliminary result)   Collection Time: 06/27/15  3:49 PM  Result Value Ref Range Status   Specimen Description BLOOD LEFT ANTECUBITAL  Final   Special Requests   Final    BOTTLES DRAWN AEROBIC AND ANAEROBIC AEB=11CC ANA=7CC   Culture NO GROWTH 3 DAYS  Final   Report Status PENDING  Incomplete  Culture, blood (routine x 2)     Status: None (Preliminary result)   Collection Time: 06/27/15  3:55 PM  Result Value Ref Range Status   Specimen Description BLOOD RIGHT HAND  Final   Special Requests BOTTLES DRAWN AEROBIC AND ANAEROBIC [place]  Final   Culture NO GROWTH 3 DAYS  Final   Report Status PENDING  Incomplete  MRSA PCR Screening     Status: None   Collection Time: 06/27/15  6:00 PM  Result Value Ref Range Status   MRSA by PCR NEGATIVE NEGATIVE Final    Comment:        The GeneXpert MRSA Assay (FDA approved for NASAL specimens only), is one component of a comprehensive MRSA colonization surveillance program. It is not intended to diagnose MRSA infection nor to guide or monitor treatment for MRSA infections.      Labs: Basic Metabolic Panel:  Recent Labs Lab 06/27/15 1330 06/28/15 0257 06/29/15 0417 06/30/15 0624  NA 136 133* 134* 135  K 3.9 4.2 4.2 4.7  CL 103 106 105 105  CO2 21* 20* 22 23  GLUCOSE 163* 170* 154* 143*  BUN 20 18 36* 34*  CREATININE 1.08* 1.04* 1.16* 1.08*  CALCIUM 8.5* 8.4* 8.6* 8.7*  MG 1.8  --   --   --    Liver Function Tests: No results for input(s): AST, ALT, ALKPHOS, BILITOT, PROT, ALBUMIN in the last 168 hours. No results for input(s): LIPASE, AMYLASE in the last 168 hours. No results for input(s): AMMONIA in the last 168 hours. CBC:  Recent Labs Lab 06/27/15 1330  WBC 9.4  NEUTROABS 6.9  HGB 14.0   HCT 42.9  MCV 95.3  PLT 251   Cardiac Enzymes:  Recent Labs Lab 06/27/15 1330 06/27/15 2056 06/28/15 0257 06/28/15 0913  TROPONINI 0.05* 0.04* 0.03 0.03   BNP: BNP (last 3 results)  Recent Labs  06/27/15 1537  BNP 880.0*    ProBNP (last 3 results) No results for input(s): PROBNP in the last 8760 hours.  CBG: No results for input(s): GLUCAP in the last 168 hours.   Signed:  Sura Canul, Orpah Melter  [facility] 06/30/2015, 4:12 PM

## 2015-06-30 NOTE — Progress Notes (Signed)
Smoking cessation information and off the beat book given to patient and discussed. IV removed. Discharge instructions reviewed with patient. Understanding verbalized. Ready for discharge home.

## 2015-07-02 LAB — CULTURE, BLOOD (ROUTINE X 2)
CULTURE: NO GROWTH
Culture: NO GROWTH

## 2015-07-06 ENCOUNTER — Encounter: Payer: Self-pay | Admitting: Cardiology

## 2015-07-06 ENCOUNTER — Ambulatory Visit (INDEPENDENT_AMBULATORY_CARE_PROVIDER_SITE_OTHER): Payer: Medicare HMO | Admitting: Cardiology

## 2015-07-06 VITALS — BP 110/78 | HR 121 | Ht 63.0 in | Wt 137.0 lb

## 2015-07-06 DIAGNOSIS — R000 Tachycardia, unspecified: Secondary | ICD-10-CM

## 2015-07-06 MED ORDER — DILTIAZEM HCL ER COATED BEADS 120 MG PO CP24: 120.0000 mg | ORAL_CAPSULE | Freq: Two times a day (BID) | ORAL | Status: DC

## 2015-07-06 MED ORDER — FUROSEMIDE 20 MG PO TABS: 20.0000 mg | ORAL_TABLET | Freq: Every day | ORAL | Status: DC | PRN

## 2015-07-06 NOTE — Progress Notes (Signed)
Patient ID: Lindsay Salazar, female   DOB: [birth year], 65 y.o.   MRN: [id number]     Clinical Summary Lindsay Salazar is a 65 y.o.female seen today as a new patient, she is referred by Dr Marylu Lund for the following medical problems.  1. Aflutter - recent admission 06/2015 with new diagnosis of aflutter, presented with RVR - managed with IV ditlitazem and then converted to oral at discharge - echo 06/2015 with LVEF 55%, midanteroseptal akinesis, mild MR, PASP 41 - she did not fill her oral diltiazem at dischage, remains on just coreg alone. She is compliant with xarelto.    Past Medical History  Diagnosis Date  . COPD (chronic obstructive pulmonary disease) ([place])   . Fibromyalgia   . Hypertension   . Chronic right hip pain      Allergies  Allergen Reactions  . Ibuprofen Nausea And Vomiting  . Penicillins Swelling  . Tiotropium Bromide Monohydrate Itching  . Tramadol Nausea And Vomiting  . Vicodin [Hydrocodone-Acetaminophen] Nausea And Vomiting     Current Outpatient Prescriptions  Medication Sig Dispense Refill  . albuterol (PROVENTIL) (2.5 MG/3ML) 0.083% nebulizer solution Take 2.5 mg by nebulization every 6 (six) hours as needed for wheezing or shortness of breath.    Marland Kitchen albuterol (VENTOLIN HFA) 108 (90 BASE) MCG/ACT inhaler Inhale 2 puffs into the lungs 2 (two) times daily. For shortness of breath    . albuterol-ipratropium (COMBIVENT) 18-103 MCG/ACT inhaler Inhale 2 puffs into the lungs 2 (two) times daily. For shortness of breath    . budesonide-formoterol (SYMBICORT) 80-4.5 MCG/ACT inhaler Inhale 2 puffs into the lungs 2 (two) times daily.    . carvedilol (COREG) 3.125 MG tablet Take 3.125 mg by mouth 2 (two) times daily with a meal.      . diltiazem (CARDIZEM) 60 MG tablet Take 1 tablet (60 mg total) by mouth every 8 (eight) hours. 90 tablet 0  . escitalopram (LEXAPRO) 10 MG tablet Take 10 mg by mouth every evening.     Marland Kitchen HYDROcodone-acetaminophen (NORCO/VICODIN) 5-325 MG  tablet Take 1 tablet by mouth every 4 (four) hours as needed for moderate pain (Must last 30 days.  Do not take and drive a car or use machinery.). 120 tablet 0  . levofloxacin (LEVAQUIN) 750 MG tablet Take 1 tablet (750 mg total) by mouth daily. 3 tablet 0  . meloxicam (MOBIC) 7.5 MG tablet Take 7.5 mg by mouth at bedtime.    . predniSONE (DELTASONE) 5 MG tablet Take 1 tablet (5 mg total) by mouth daily with breakfast.    . Rivaroxaban (XARELTO) 15 MG TABS tablet Take 1 tablet (15 mg total) by mouth daily with supper. 30 tablet 0   No current facility-administered medications for this visit.     Past Surgical History  Procedure Laterality Date  . Neck surgery    . Tonsillectomy    . Tubal ligation       Allergies  Allergen Reactions  . Ibuprofen Nausea And Vomiting  . Penicillins Swelling  . Tiotropium Bromide Monohydrate Itching  . Tramadol Nausea And Vomiting  . Vicodin [Hydrocodone-Acetaminophen] Nausea And Vomiting      No family history on file.   Social History Lindsay Salazar reports that she has been smoking.  She does not have any smokeless tobacco history on file. Lindsay Salazar reports that she does not drink alcohol.   Review of Systems CONSTITUTIONAL: No weight loss, fever, chills, weakness or fatigue.  HEENT: Eyes: No visual loss, blurred vision,  double vision or yellow sclerae.No hearing loss, sneezing, congestion, runny nose or sore throat.  SKIN: No rash or itching.  CARDIOVASCULAR:no chest pain, occasional palpitations RESPIRATORY: No shortness of breath, cough or sputum.  GASTROINTESTINAL: No anorexia, nausea, vomiting or diarrhea. No abdominal pain or blood.  GENITOURINARY: No burning on urination, no polyuria NEUROLOGICAL: No headache, dizziness, syncope, paralysis, ataxia, numbness or tingling in the extremities. No change in bowel or bladder control.  MUSCULOSKELETAL: No muscle, back pain, joint pain or stiffness.  LYMPHATICS: No enlarged nodes. No  history of splenectomy.  PSYCHIATRIC: No history of depression or anxiety.  ENDOCRINOLOGIC: No reports of sweating, cold or heat intolerance. No polyuria or polydipsia.  Marland Kitchen   Physical Examination Filed Vitals:   07/06/15 1413  BP: 110/78  Pulse: 121   Filed Vitals:   07/06/15 1413  Height: 5\' 3"  (1.6 m)  Weight: 137 lb (62.143 kg)    Gen: resting comfortably, no acute distress HEENT: no scleral icterus, pupils equal round and reactive, no palptable cervical adenopathy,  CV: irreg, rate 110, no m/r/g, no jvd Resp: Clear to auscultation bilaterally GI: abdomen is soft, non-tender, non-distended, normal bowel sounds, no hepatosplenomegaly MSK: extremities are warm, no edema.  Skin: warm, no rash Neuro:  no focal deficits Psych: appropriate affect    Assessment and Plan  1. Aflutter - ekg in clinic shows aflutter with elevated rates. She did not fill the Rx her oral diltaizem after recent discharge - we will d/c coreg, change to dilt long acting 120mg  bid and titrate as needed for rate control - can consider trial of DCCV once she has been on anticoag for a minimum of 3 weeks   She will f/u 1 week to f/u heart rates, titrate diltiazem as needed for rate control.     Arnoldo Lenis, M.D

## 2015-07-06 NOTE — Patient Instructions (Signed)
Your physician recommends that you schedule a follow-up appointment in: 1 Week  Your physician has recommended you make the following change in your medication:   Stop Taking : Diltiazem 60 mg  Carvedilol 3.125 mg  Start Taking :  Lasix 20 mg Daily as needed for swelling  Cardizem 120 mg Two Times Daily   If you need a refill on your cardiac medications before your next appointment, please call your pharmacy.  Thank you for choosing [facility]!

## 2015-07-13 ENCOUNTER — Encounter: Payer: Self-pay | Admitting: Adult Health

## 2015-07-13 ENCOUNTER — Ambulatory Visit (INDEPENDENT_AMBULATORY_CARE_PROVIDER_SITE_OTHER): Payer: Medicare HMO | Admitting: Adult Health

## 2015-07-13 VITALS — BP 140/72 | HR 103 | Ht 64.0 in | Wt 135.0 lb

## 2015-07-13 DIAGNOSIS — I10 Essential (primary) hypertension: Secondary | ICD-10-CM | POA: Diagnosis not present

## 2015-07-13 DIAGNOSIS — I4891 Unspecified atrial fibrillation: Secondary | ICD-10-CM | POA: Diagnosis not present

## 2015-07-13 NOTE — Progress Notes (Addendum)
Cardiology Office Note   Date:  07/13/2015   ID:  [name], DOB 07/14/1950, MRN [id number]  PCP:  [name], PA-C  Cardiologist: Cloria Spring, NP   No chief complaint on file.     History of Present Illness: Lindsay Salazar is a 64 y.o. female who presents for ongoing assessment and management of atrial flutter,CHADS VASC Score of 3. She was last seen by Dr. Harl Bowie on 07/06/2015, note stated echocardiogram on 5 2017 revealed LVEF of 55%, with mid anteroseptal akinesis mild MR, with a PASP of 41.she is currently on XARELTO and carvedilol.on last office visit, the patient was considered for direct current cardioversion, she is here for followup to have EKG and evaluate need to plan for this procedure.  She comes today still symptomatic with atrial fib. She has been on Xarelto,  tolerating it well but states that she hates the way she feels with her heart irregular she is tired dyspneic and has feelings of fatigue. She is very sensitive to irregular heart rhythm. It is very uncomfortable with this.   Echocardiogram 06/28/2015 Left ventricle: The cavity size was normal. Wall thickness was  normal. Systolic function was normal. The estimated ejection  fraction was 55%. There is akinesis of the midanteroseptal  myocardium. The study is not technically sufficient to allow  evaluation of LV diastolic function. - Aortic valve: Mildly calcified annulus. Trileaflet. There was  trivial regurgitation. - Mitral valve: Mildly thickened leaflets . There was mild  regurgitation. - Left atrium: The atrium was mildly dilated. - Right ventricle: Systolic function was low normal. - Right atrium: The atrium was mildly dilated. Central venous  pressure (est): 8 mm Hg. - Tricuspid valve: There was moderate regurgitation. - Pulmonary arteries: PA peak pressure: 41 mm Hg (S). - Pericardium, extracardiac: There was no pericardial effusion.  Impressions:  Normal LV wall thickness  with LVEF approximately 55%. There is  akinesis of the mid anteroseptal wall. Indeterminate diastolic  function in the setting of atrial fibrillation/flutter. Mild  biatrial enlargement. Mildly thickened mitral leaflets with mild  mitral regurgitation. Trivial aortic regurgitation. Low normal RV  contraction. Moderate tricuspid regurgitation with PASP 41 mmHg.  Past Medical History  Diagnosis Date  . COPD (chronic obstructive pulmonary disease) ([place])   . Fibromyalgia   . Hypertension   . Chronic right hip pain     Past Surgical History  Procedure Laterality Date  . Neck surgery    . Tonsillectomy    . Tubal ligation       Current Outpatient Prescriptions  Medication Sig Dispense Refill  . albuterol (PROVENTIL) (2.5 MG/3ML) 0.083% nebulizer solution Take 2.5 mg by nebulization every 6 (six) hours as needed for wheezing or shortness of breath.    Marland Kitchen albuterol (VENTOLIN HFA) 108 (90 BASE) MCG/ACT inhaler Inhale 2 puffs into the lungs 2 (two) times daily. For shortness of breath    . albuterol-ipratropium (COMBIVENT) 18-103 MCG/ACT inhaler Inhale 2 puffs into the lungs 2 (two) times daily. For shortness of breath    . budesonide-formoterol (SYMBICORT) 80-4.5 MCG/ACT inhaler Inhale 2 puffs into the lungs 2 (two) times daily.    Marland Kitchen diltiazem (CARDIZEM CD) 120 MG 24 hr capsule Take 1 capsule (120 mg total) by mouth 2 (two) times daily. 60 capsule 6  . escitalopram (LEXAPRO) 10 MG tablet Take 10 mg by mouth every evening.     . furosemide (LASIX) 20 MG tablet Take 1 tablet (20 mg total) by mouth daily  as needed for edema. 90 tablet 3  . HYDROcodone-acetaminophen (NORCO/VICODIN) 5-325 MG tablet Take 1 tablet by mouth every 4 (four) hours as needed for moderate pain (Must last 30 days.  Do not take and drive a car or use machinery.). 120 tablet 0  . meloxicam (MOBIC) 7.5 MG tablet Take 7.5 mg by mouth at bedtime.    . predniSONE (DELTASONE) 5 MG tablet Take 1 tablet (5 mg total) by mouth  daily with breakfast.    . Rivaroxaban (XARELTO) 15 MG TABS tablet Take 1 tablet (15 mg total) by mouth daily with supper. 30 tablet 0   No current facility-administered medications for this visit.    Allergies:   Ibuprofen; Penicillins; Tiotropium bromide monohydrate; Tramadol; and Vicodin    Social History:  The patient  reports that she has been smoking Cigarettes.  She has been smoking about 0.25 packs per day. She does not have any smokeless tobacco history on file. She reports that she does not drink alcohol or use illicit drugs.   Family History:  The patient's family history is not on file.    ROS: All other systems are reviewed and negative. Unless otherwise mentioned in H&P    PHYSICAL EXAM: VS:  BP 140/72 mmHg  Pulse 103  Ht 5\' 4"  (1.626 m)  Wt 135 lb (61.236 kg)  BMI 23.16 kg/m2  SpO2 89% , BMI Body mass index is 23.16 kg/(m^2). GEN: Well nourished, well developed, in no acute distress HEENT: normalsevere dental caries. Significant body odor.  Neck: no JVD, carotid bruits, or masses Cardiac: IRRR; no murmurs, rubs, or gallops,no edema  Respiratory: Bilateral wheezes with crackles in the bases occasional nonproductive cough with inspiration GI: soft, nontender, nondistended, + BS MS: no deformity or atrophyscattered bruising on the arms and knees. Skin: warm and dry, no rash Neuro:  Strength and sensation are intact Psych: euthymic mood, full affect   EKG:  Atrial fibrillation heart rate 107 beats per minute with nonspecific T-wave abnormality.T  Recent Labs: 06/27/2015: B Natriuretic Peptide 880.0*; Hemoglobin 14.0; Magnesium 1.8; Platelets 251; TSH 2.678 06/30/2015: BUN 34*; Creatinine, Ser 1.08*; Potassium 4.7; Sodium 135    Lipid Panel No results found for: CHOL, TRIG, HDL, CHOLHDL, VLDL, LDLCALC, LDLDIRECT    Wt Readings from Last 3 Encounters:  07/13/15 135 lb (61.236 kg)  07/06/15 137 lb (62.143 kg)  06/29/15 134 lb 14.7 oz (61.2 kg)        ASSESSMENT AND PLAN:  1.  Atrial fibrillation: She is symptomatic with this, feeling tired very sensitive to irregularity with associated shortness of breath and fatigue. On Dr.Branch's last office note he discussed the need for cardioversion after being on Xarelto. She has been on XARELTO for 2 weeks. I have discussed with there direct current cardioversion and she is willing to proceed, as she states she feels so bad with irregular heart rhythm. I have explained the procedure to her risks and benefits and she verbalizes understanding and is anxious to proceed. We will schedule this for sometime within the next week. The patient will be notified and orders will be written at that time. She will continue her medication regimen as directed to include XARELTO.  2. Hypertension: Blood pressure is currently controlled no changes in her medication regimen.  3. Chronic emphysema:she has quit smoking but continues to have a raspy productive cough. She will need a follow with primary care for ongoing management.  Current medicines are reviewed at length with the patient today.  Labs/ tests ordered today include: DCCV  Orders Placed This Encounter  Procedures  . CBC with Differential  . Basic Metabolic Panel (BMET)  . INR/PT  . EKG 12-Lead     Disposition:   FU with post direct-current cardioversion.  Signed, Lindsay Sims, NP  07/13/2015 4:18 PM    [facility] [address], [place], [place] [address] Phone: [phone]; Fax: [phone]

## 2015-07-13 NOTE — Progress Notes (Signed)
Name: LLADIRA BARRINGTON    DOB: [birth year]  Age: 65 y.o.  MR#: [id number]       PCP:  [name], PA-C      Insurance: Payor: [employer] MEDICARE / Plan: [place] [employer] / Product Type: *No Product type* /   CC:   No chief complaint on file.   VS Filed Vitals:   07/13/15 1444  BP: 140/72  Pulse: 103  Height: 5\' 4"  (1.626 m)  Weight: 135 lb (61.236 kg)  SpO2: 89%    Weights Current Weight  07/13/15 135 lb (61.236 kg)  07/06/15 137 lb (62.143 kg)  06/29/15 134 lb 14.7 oz (61.2 kg)    Blood Pressure  BP Readings from Last 3 Encounters:  07/13/15 140/72  07/06/15 110/78  06/30/15 125/86     Admit date:  (Not on file) Last encounter with RMR:  Visit date not found   Allergy Ibuprofen; Penicillins; Tiotropium bromide monohydrate; Tramadol; and Vicodin  Current Outpatient Prescriptions  Medication Sig Dispense Refill  . albuterol (PROVENTIL) (2.5 MG/3ML) 0.083% nebulizer solution Take 2.5 mg by nebulization every 6 (six) hours as needed for wheezing or shortness of breath.    Marland Kitchen albuterol (VENTOLIN HFA) 108 (90 BASE) MCG/ACT inhaler Inhale 2 puffs into the lungs 2 (two) times daily. For shortness of breath    . albuterol-ipratropium (COMBIVENT) 18-103 MCG/ACT inhaler Inhale 2 puffs into the lungs 2 (two) times daily. For shortness of breath    . budesonide-formoterol (SYMBICORT) 80-4.5 MCG/ACT inhaler Inhale 2 puffs into the lungs 2 (two) times daily.    Marland Kitchen diltiazem (CARDIZEM CD) 120 MG 24 hr capsule Take 1 capsule (120 mg total) by mouth 2 (two) times daily. 60 capsule 6  . escitalopram (LEXAPRO) 10 MG tablet Take 10 mg by mouth every evening.     . furosemide (LASIX) 20 MG tablet Take 1 tablet (20 mg total) by mouth daily as needed for edema. 90 tablet 3  . HYDROcodone-acetaminophen (NORCO/VICODIN) 5-325 MG tablet Take 1 tablet by mouth every 4 (four) hours as needed for moderate pain (Must last 30 days.  Do not take and drive a car or use machinery.). 120 tablet 0  . meloxicam  (MOBIC) 7.5 MG tablet Take 7.5 mg by mouth at bedtime.    . predniSONE (DELTASONE) 5 MG tablet Take 1 tablet (5 mg total) by mouth daily with breakfast.    . Rivaroxaban (XARELTO) 15 MG TABS tablet Take 1 tablet (15 mg total) by mouth daily with supper. 30 tablet 0   No current facility-administered medications for this visit.    Discontinued Meds:   There are no discontinued medications.  Patient Active Problem List   Diagnosis Date Noted  . Atrial flutter ([place]) 06/27/2015  . Atrial fibrillation with RVR ([place]) 06/27/2015  . COPD with exacerbation ([place]) 06/27/2015  . Hypertensive urgency 06/27/2015  . Tobacco abuse 06/27/2015  . Elevated troponin 06/27/2015  . CAP (community acquired pneumonia)   . OSTEOPOROSIS 04/10/2009  . WEIGHT LOSS 04/10/2009    LABS    Component Value Date/Time   NA 135 06/30/2015 0624   NA 134* 06/29/2015 0417   NA 133* 06/28/2015 0257   K 4.7 06/30/2015 0624   K 4.2 06/29/2015 0417   K 4.2 06/28/2015 0257   CL 105 06/30/2015 0624   CL 105 06/29/2015 0417   CL 106 06/28/2015 0257   CO2 23 06/30/2015 0624   CO2 22 06/29/2015 0417   CO2 20* 06/28/2015 0257  GLUCOSE 143* 06/30/2015 0624   GLUCOSE 154* 06/29/2015 0417   GLUCOSE 170* 06/28/2015 0257   BUN 34* 06/30/2015 0624   BUN 36* 06/29/2015 0417   BUN 18 06/28/2015 0257   CREATININE 1.08* 06/30/2015 0624   CREATININE 1.16* 06/29/2015 0417   CREATININE 1.04* 06/28/2015 0257   CALCIUM 8.7* 06/30/2015 0624   CALCIUM 8.6* 06/29/2015 0417   CALCIUM 8.4* 06/28/2015 0257   GFRNONAA 52* 06/30/2015 0624   GFRNONAA 48* 06/29/2015 0417   GFRNONAA 54* 06/28/2015 0257   GFRAA >60 06/30/2015 0624   GFRAA 55* 06/29/2015 0417   GFRAA >60 06/28/2015 0257   CMP     Component Value Date/Time   NA 135 06/30/2015 0624   K 4.7 06/30/2015 0624   CL 105 06/30/2015 0624   CO2 23 06/30/2015 0624   GLUCOSE 143* 06/30/2015 0624   BUN 34* 06/30/2015 0624   CREATININE 1.08* 06/30/2015 0624   CALCIUM 8.7*  06/30/2015 0624   GFRNONAA 52* 06/30/2015 0624   GFRAA >60 06/30/2015 0624       Component Value Date/Time   WBC 9.4 06/27/2015 1330   HGB 14.0 06/27/2015 1330   HCT 42.9 06/27/2015 1330   MCV 95.3 06/27/2015 1330    Lipid Panel  No results found for: CHOL, TRIG, HDL, CHOLHDL, VLDL, LDLCALC, LDLDIRECT  ABG No results found for: PHART, PCO2ART, PO2ART, HCO3, TCO2, ACIDBASEDEF, O2SAT   Lab Results  Component Value Date   TSH 2.678 06/27/2015   BNP (last 3 results)  Recent Labs  06/27/15 1537  BNP 880.0*    ProBNP (last 3 results) No results for input(s): PROBNP in the last 8760 hours.  Cardiac Panel (last 3 results) No results for input(s): CKTOTAL, CKMB, TROPONINI, RELINDX in the last 72 hours.  Iron/TIBC/Ferritin/ %Sat No results found for: IRON, TIBC, FERRITIN, IRONPCTSAT   EKG Orders placed or performed in visit on 07/06/15  . EKG 12-Lead     Prior Assessment and Plan Problem List as of 07/13/2015      Cardiovascular and Mediastinum   Atrial flutter ([facility])   Atrial fibrillation with RVR ([facility])   Hypertensive urgency     Respiratory   COPD with exacerbation ([place])   CAP (community acquired pneumonia)     Musculoskeletal and Integument   OSTEOPOROSIS     Other   WEIGHT LOSS   Tobacco abuse   Elevated troponin       Imaging: Dg Chest Portable 1 View  06/27/2015  CLINICAL DATA:  65 year old female with increasing shortness of breath for several days. Initial encounter. Smoker. COPD. EXAM: PORTABLE CHEST 1 VIEW COMPARISON:  09/25/2007. FINDINGS: Upright AP view of the chest at 1352 hours. New confluent right lung base opacity. Underlying chronic hyperinflation. Stable mediastinal contours. No pneumothorax or pulmonary edema. No definite pleural effusion. Chronic ACDF hardware in the cervical spine. No acute osseous abnormality identified. IMPRESSION: Patchy right lung base opacity compatible with acute infectious exacerbation superimposed on chronic lung  disease with hyperinflation. No definite pleural effusion. Follow-up PA and lateral views of the chest would be helpful when possible. Electronically Signed   By: Genevie Ann M.D.   On: 06/27/2015 14:05

## 2015-07-13 NOTE — Patient Instructions (Addendum)
Your physician recommends that you schedule a follow-up appointment after your Cardioversion  Your physician recommends that you continue on your current medications as directed. Please refer to the Current Medication list given to you today.  Your physician has recommended that you have a Cardioversion (DCCV). Electrical Cardioversion uses a jolt of electricity to your heart either through paddles or wired patches attached to your chest. This is a controlled, usually prescheduled, procedure. Defibrillation is done under light anesthesia in the hospital, and you usually go home the day of the procedure. This is done to get your heart back into a normal rhythm. You are not awake for the procedure. Please see the instruction sheet given to you today.  You have been given samples of Xarelto   Your physician recommends that you return for lab work.    If you need a refill on your cardiac medications before your next appointment, please call your pharmacy.  Thank you for choosing [facility]!

## 2015-07-16 ENCOUNTER — Other Ambulatory Visit: Payer: Self-pay | Admitting: Adult Health

## 2015-07-16 DIAGNOSIS — I4819 Other persistent atrial fibrillation: Secondary | ICD-10-CM

## 2015-07-16 NOTE — Patient Instructions (Addendum)
[facility]  07/16/2015     @PREFPERIOPPHARMACY @   Your procedure is scheduled on  07/18/2015   Report to [facility] at  25  A.M.  Call this number if you have problems the morning of surgery:  [phone]   Remember:  Do not eat food or drink liquids after midnight.  Take these medicines the morning of surgery with A SIP OF WATER  Cardiazem, lexapro, norco, mobic. Take your inhalers before you come and bring your rescue inhaler with you when you come.   Do not wear jewelry, make-up or nail polish.  Do not wear lotions, powders, or perfumes.  You may wear deodorant.  Do not shave 48 hours prior to surgery.  Men may shave face and neck.  Do not bring valuables to the hospital.  [facility] is not responsible for any belongings or valuables.  Contacts, dentures or bridgework may not be worn into surgery.  Leave your suitcase in the car.  After surgery it may be brought to your room.  For patients admitted to the hospital, discharge time will be determined by your treatment team.  Patients discharged the day of surgery will not be allowed to drive home.   Name and phone number of your driver:   family Special instructions:  none  Please read over the following fact sheets that you were given. Coughing and Deep Breathing, Surgical Site Infection Prevention, Anesthesia Post-op Instructions and Care and Recovery After Surgery      Electrical Cardioversion Electrical cardioversion is the delivery of a jolt of electricity to change the rhythm of the heart. Sticky patches or metal paddles are placed on the chest to deliver the electricity from a device. This is done to restore a normal rhythm. A rhythm that is too fast or not regular keeps the heart from pumping well. Electrical cardioversion is done in an emergency if:   There is low or no blood pressure as a result of the heart rhythm.   Normal rhythm must be restored as fast as possible to protect the brain and  heart from further damage.   It may save a life. Cardioversion may be done for heart rhythms that are not immediately life threatening, such as atrial fibrillation or flutter, in which:   The heart is beating too fast or is not regular.   Medicine to change the rhythm has not worked.   It is safe to wait in order to allow time for preparation.  Symptoms of the abnormal rhythm are bothersome.  The risk of stroke and other serious problems can be reduced. LET [facility] CARE PROVIDER KNOW ABOUT:   Any allergies you have.  All medicines you are taking, including vitamins, herbs, eye drops, creams, and over-the-counter medicines.  Previous problems you or members of your family have had with the use of anesthetics.   Any blood disorders you have.   Previous surgeries you have had.   Medical conditions you have. RISKS AND COMPLICATIONS  Generally, this is a safe procedure. However, problems can occur and include:   Breathing problems related to the anesthetic used.  A blood clot that breaks free and travels to other parts of your body. This could cause a stroke or other problems. The risk of this is lowered by use of blood-thinning medicine (anticoagulant) prior to the procedure.  Cardiac arrest (rare). BEFORE THE PROCEDURE   You may have tests to detect blood clots in  your heart and to evaluate heart function.  You may start taking anticoagulants so your blood does not clot as easily.   Medicines may be given to help stabilize your heart rate and rhythm. PROCEDURE  You will be given medicine through an IV tube to reduce discomfort and make you sleepy (sedative).   An electrical shock will be delivered. AFTER THE PROCEDURE Your heart rhythm will be watched to make sure it does not change.    This information is not intended to replace advice given to you by your health care provider. Make sure you discuss any questions you have with your health care provider.     Document Released: 01/10/2002 Document Revised: 02/10/2014 Document Reviewed: 08/04/2012 Elsevier Interactive Patient Education 2016 [employer]. Electrical Cardioversion, Care After Refer to this sheet in the next few weeks. These instructions provide you with information on caring for yourself after your procedure. Your health care provider may also give you more specific instructions. Your treatment has been planned according to current medical practices, but problems sometimes occur. Call your health care provider if you have any problems or questions after your procedure. WHAT TO EXPECT AFTER THE PROCEDURE After your procedure, it is typical to have the following sensations:  Some redness on the skin where the shocks were delivered. If this is tender, a sunburn lotion or hydrocortisone cream may help.  Possible return of an abnormal heart rhythm within hours or days after the procedure. HOME CARE INSTRUCTIONS  Take medicines only as directed by your health care provider. Be sure you understand how and when to take your medicine.  Learn how to feel your pulse and check it often.  Limit your activity for 48 hours after the procedure or as directed by your health care provider.  Avoid or minimize caffeine and other stimulants as directed by your health care provider. SEEK MEDICAL CARE IF:  You feel like your heart is beating too fast or your pulse is not regular.  You have any questions about your medicines.  You have bleeding that will not stop. SEEK IMMEDIATE MEDICAL CARE IF:  You are dizzy or feel faint.  It is hard to breathe or you feel short of breath.  There is a change in discomfort in your chest.  Your speech is slurred or you have trouble moving an arm or leg on one side of your body.  You get a serious muscle cramp that does not go away.  Your fingers or toes turn cold or blue.   This information is not intended to replace advice given to you by your health  care provider. Make sure you discuss any questions you have with your health care provider.   Document Released: 11/10/2012 Document Revised: 02/10/2014 Document Reviewed: 11/10/2012 Elsevier Interactive Patient Education 2016 [employer]. PATIENT INSTRUCTIONS POST-ANESTHESIA  IMMEDIATELY FOLLOWING SURGERY:  Do not drive or operate machinery for the first twenty four hours after surgery.  Do not make any important decisions for twenty four hours after surgery or while taking narcotic pain medications or sedatives.  If you develop intractable nausea and vomiting or a severe headache please notify your doctor immediately.  FOLLOW-UP:  Please make an appointment with your surgeon as instructed. You do not need to follow up with anesthesia unless specifically instructed to do so.  WOUND CARE INSTRUCTIONS (if applicable):  Keep a dry clean dressing on the anesthesia/puncture wound site if there is drainage.  Once the wound has quit draining you may leave it  open to air.  Generally you should leave the bandage intact for twenty four hours unless there is drainage.  If the epidural site drains for more than 36-48 hours please call the anesthesia department.  QUESTIONS?:  Please feel free to call your physician or the hospital operator if you have any questions, and they will be happy to assist you.

## 2015-07-17 ENCOUNTER — Inpatient Hospital Stay (HOSPITAL_COMMUNITY): Payer: Medicare HMO

## 2015-07-17 ENCOUNTER — Encounter (HOSPITAL_COMMUNITY)
Admission: RE | Admit: 2015-07-17 | Discharge: 2015-07-17 | Disposition: A | Discharge status: Home / Self Care | Payer: Medicare HMO | Source: Ambulatory Visit | Attending: Cardiovascular Disease | Admitting: Cardiovascular Disease

## 2015-07-17 ENCOUNTER — Inpatient Hospital Stay (HOSPITAL_COMMUNITY)
Admission: EM | Admit: 2015-07-17 | Discharge: 2015-07-19 | DRG: 309 | Disposition: A | Discharge status: Home Health | Payer: Medicare HMO | Attending: Internal Medicine | Admitting: Internal Medicine

## 2015-07-17 ENCOUNTER — Encounter (HOSPITAL_COMMUNITY): Payer: Self-pay | Admitting: *Deleted

## 2015-07-17 ENCOUNTER — Other Ambulatory Visit: Payer: Self-pay

## 2015-07-17 ENCOUNTER — Emergency Department (HOSPITAL_COMMUNITY): Payer: Medicare HMO

## 2015-07-17 DIAGNOSIS — R0600 Dyspnea, unspecified: Secondary | ICD-10-CM | POA: Insufficient documentation

## 2015-07-17 DIAGNOSIS — Z825 Family history of asthma and other chronic lower respiratory diseases: Secondary | ICD-10-CM

## 2015-07-17 DIAGNOSIS — D649 Anemia, unspecified: Secondary | ICD-10-CM | POA: Diagnosis present

## 2015-07-17 DIAGNOSIS — R739 Hyperglycemia, unspecified: Secondary | ICD-10-CM | POA: Diagnosis present

## 2015-07-17 DIAGNOSIS — R7989 Other specified abnormal findings of blood chemistry: Secondary | ICD-10-CM | POA: Diagnosis not present

## 2015-07-17 DIAGNOSIS — J449 Chronic obstructive pulmonary disease, unspecified: Secondary | ICD-10-CM

## 2015-07-17 DIAGNOSIS — Z823 Family history of stroke: Secondary | ICD-10-CM

## 2015-07-17 DIAGNOSIS — T380X5A Adverse effect of glucocorticoids and synthetic analogues, initial encounter: Secondary | ICD-10-CM | POA: Diagnosis present

## 2015-07-17 DIAGNOSIS — I4892 Unspecified atrial flutter: Secondary | ICD-10-CM | POA: Diagnosis present

## 2015-07-17 DIAGNOSIS — I4891 Unspecified atrial fibrillation: Secondary | ICD-10-CM | POA: Diagnosis present

## 2015-07-17 DIAGNOSIS — I10 Essential (primary) hypertension: Secondary | ICD-10-CM | POA: Diagnosis present

## 2015-07-17 DIAGNOSIS — D508 Other iron deficiency anemias: Secondary | ICD-10-CM

## 2015-07-17 DIAGNOSIS — I4819 Other persistent atrial fibrillation: Secondary | ICD-10-CM

## 2015-07-17 DIAGNOSIS — F17210 Nicotine dependence, cigarettes, uncomplicated: Secondary | ICD-10-CM | POA: Diagnosis present

## 2015-07-17 DIAGNOSIS — Z981 Arthrodesis status: Secondary | ICD-10-CM | POA: Diagnosis not present

## 2015-07-17 DIAGNOSIS — I481 Persistent atrial fibrillation: Principal | ICD-10-CM | POA: Diagnosis present

## 2015-07-17 DIAGNOSIS — M797 Fibromyalgia: Secondary | ICD-10-CM | POA: Diagnosis present

## 2015-07-17 DIAGNOSIS — R799 Abnormal finding of blood chemistry, unspecified: Secondary | ICD-10-CM | POA: Diagnosis present

## 2015-07-17 DIAGNOSIS — I248 Other forms of acute ischemic heart disease: Secondary | ICD-10-CM | POA: Diagnosis present

## 2015-07-17 DIAGNOSIS — J441 Chronic obstructive pulmonary disease with (acute) exacerbation: Secondary | ICD-10-CM | POA: Insufficient documentation

## 2015-07-17 DIAGNOSIS — Z833 Family history of diabetes mellitus: Secondary | ICD-10-CM

## 2015-07-17 DIAGNOSIS — Z7901 Long term (current) use of anticoagulants: Secondary | ICD-10-CM

## 2015-07-17 DIAGNOSIS — R778 Other specified abnormalities of plasma proteins: Secondary | ICD-10-CM | POA: Diagnosis present

## 2015-07-17 DIAGNOSIS — R0902 Hypoxemia: Secondary | ICD-10-CM | POA: Diagnosis present

## 2015-07-17 HISTORY — DX: Cardiac murmur, unspecified: R01.1

## 2015-07-17 HISTORY — DX: Unspecified asthma, uncomplicated: J45.909

## 2015-07-17 HISTORY — DX: Unspecified osteoarthritis, unspecified site: M19.90

## 2015-07-17 HISTORY — DX: Gastro-esophageal reflux disease without esophagitis: K21.9

## 2015-07-17 LAB — CBC WITH DIFFERENTIAL/PLATELET
BASOS ABS: 0 10*3/uL (ref 0.0–0.1)
Basophils Relative: 0 %
Eosinophils Absolute: 0.1 10*3/uL (ref 0.0–0.7)
Eosinophils Relative: 1 %
HCT: 33.4 % — ABNORMAL LOW (ref 36.0–46.0)
HEMOGLOBIN: 11 g/dL — AB (ref 12.0–15.0)
LYMPHS ABS: 1.5 10*3/uL (ref 0.7–4.0)
LYMPHS PCT: 10 %
MCH: 32.1 pg (ref 26.0–34.0)
MCHC: 32.9 g/dL (ref 30.0–36.0)
MCV: 97.4 fL (ref 78.0–100.0)
Monocytes Absolute: 0.8 10*3/uL (ref 0.1–1.0)
Monocytes Relative: 5 %
NEUTROS PCT: 84 %
Neutro Abs: 12.8 10*3/uL — ABNORMAL HIGH (ref 1.7–7.7)
PLATELETS: 170 10*3/uL (ref 150–400)
RBC: 3.43 MIL/uL — AB (ref 3.87–5.11)
RDW: 16.8 % — ABNORMAL HIGH (ref 11.5–15.5)
WBC: 15.1 10*3/uL — AB (ref 4.0–10.5)

## 2015-07-17 LAB — BASIC METABOLIC PANEL
Anion gap: 5 (ref 5–15)
BUN: 30 mg/dL — ABNORMAL HIGH (ref 6–20)
CO2: 27 mmol/L (ref 22–32)
CREATININE: 0.95 mg/dL (ref 0.44–1.00)
Calcium: 8.2 mg/dL — ABNORMAL LOW (ref 8.9–10.3)
Chloride: 108 mmol/L (ref 101–111)
GFR calc Af Amer: >60 mL/min (ref >60)
GFR calc non Af Amer: >60 mL/min (ref >60)
GLUCOSE: 119 mg/dL — AB (ref 65–99)
Potassium: 4.1 mmol/L (ref 3.5–5.1)
Sodium: 140 mmol/L (ref 135–145)

## 2015-07-17 LAB — TROPONIN I
TROPONIN I: 0.07 ng/mL — AB (ref <0.031)
TROPONIN I: 0.09 ng/mL — AB (ref <0.031)
Troponin I: 0.2 ng/mL — ABNORMAL HIGH (ref <0.031)
Troponin I: 0.15 ng/mL — ABNORMAL HIGH (ref <0.031)

## 2015-07-17 LAB — BRAIN NATRIURETIC PEPTIDE: B NATRIURETIC PEPTIDE 5: 415 pg/mL — AB (ref 0.0–100.0)

## 2015-07-17 MED ORDER — ACETAMINOPHEN 650 MG RE SUPP: 650.0000 mg | Freq: Four times a day (QID) | RECTAL | Status: DC | PRN

## 2015-07-17 MED ORDER — IPRATROPIUM-ALBUTEROL 0.5-2.5 (3) MG/3ML IN SOLN
3.0000 mL | Freq: Four times a day (QID) | RESPIRATORY_TRACT | Status: DC
  Administered 2015-07-17 (×3): 3 mL via RESPIRATORY_TRACT
  Filled 2015-07-17 (×3): qty 3

## 2015-07-17 MED ORDER — DILTIAZEM HCL ER COATED BEADS 120 MG PO CP24
120.0000 mg | ORAL_CAPSULE | Freq: Every day | ORAL | Status: DC
  Administered 2015-07-17 – 2015-07-19 (×3): 120 mg via ORAL
  Filled 2015-07-17 (×3): qty 1

## 2015-07-17 MED ORDER — MOMETASONE FURO-FORMOTEROL FUM 100-5 MCG/ACT IN AERO
2.0000 | INHALATION_SPRAY | Freq: Two times a day (BID) | RESPIRATORY_TRACT | Status: DC
  Administered 2015-07-17 – 2015-07-18 (×4): 2 via RESPIRATORY_TRACT
  Filled 2015-07-17: qty 8.8

## 2015-07-17 MED ORDER — ALBUTEROL SULFATE (2.5 MG/3ML) 0.083% IN NEBU: 2.5000 mg | INHALATION_SOLUTION | Freq: Four times a day (QID) | RESPIRATORY_TRACT | Status: DC | PRN

## 2015-07-17 MED ORDER — SODIUM CHLORIDE 0.9% FLUSH
3.0000 mL | Freq: Two times a day (BID) | INTRAVENOUS | Status: DC
  Administered 2015-07-18 (×2): 3 mL via INTRAVENOUS

## 2015-07-17 MED ORDER — CETYLPYRIDINIUM CHLORIDE 0.05 % MT LIQD
7.0000 mL | Freq: Two times a day (BID) | OROMUCOSAL | Status: DC
  Administered 2015-07-18: 7 mL via OROMUCOSAL

## 2015-07-17 MED ORDER — DILTIAZEM LOAD VIA INFUSION
15.0000 mg | Freq: Once | INTRAVENOUS | Status: DC
  Filled 2015-07-17: qty 15

## 2015-07-17 MED ORDER — DILTIAZEM HCL 25 MG/5ML IV SOLN
10.0000 mg | Freq: Three times a day (TID) | INTRAVENOUS | Status: DC | PRN
  Administered 2015-07-18: 10 mg via INTRAVENOUS
  Filled 2015-07-17: qty 5

## 2015-07-17 MED ORDER — ACETAMINOPHEN 325 MG PO TABS: 650.0000 mg | ORAL_TABLET | Freq: Four times a day (QID) | ORAL | Status: DC | PRN

## 2015-07-17 MED ORDER — HYDROCODONE-HOMATROPINE 5-1.5 MG/5ML PO SYRP: 5.0000 mL | ORAL_SOLUTION | ORAL | Status: DC | PRN

## 2015-07-17 MED ORDER — ESCITALOPRAM OXALATE 10 MG PO TABS
10.0000 mg | ORAL_TABLET | Freq: Every evening | ORAL | Status: DC
  Administered 2015-07-17 – 2015-07-18 (×2): 10 mg via ORAL
  Filled 2015-07-17 (×2): qty 1

## 2015-07-17 MED ORDER — SODIUM CHLORIDE 0.9% FLUSH
3.0000 mL | Freq: Two times a day (BID) | INTRAVENOUS | Status: DC
  Administered 2015-07-17: 3 mL via INTRAVENOUS

## 2015-07-17 MED ORDER — LEVOFLOXACIN IN D5W 750 MG/150ML IV SOLN
750.0000 mg | INTRAVENOUS | Status: DC
  Administered 2015-07-17: 750 mg via INTRAVENOUS
  Filled 2015-07-17 (×2): qty 150

## 2015-07-17 MED ORDER — METHYLPREDNISOLONE SODIUM SUCC 125 MG IJ SOLR
80.0000 mg | Freq: Three times a day (TID) | INTRAMUSCULAR | Status: DC
  Administered 2015-07-17 – 2015-07-18 (×4): 80 mg via INTRAVENOUS
  Filled 2015-07-17 (×4): qty 2

## 2015-07-17 MED ORDER — SODIUM CHLORIDE 0.9% FLUSH
3.0000 mL | Freq: Two times a day (BID) | INTRAVENOUS | Status: DC
  Administered 2015-07-19: 3 mL via INTRAVENOUS

## 2015-07-17 MED ORDER — DILTIAZEM HCL 25 MG/5ML IV SOLN: 10.0000 mg | Freq: Once | INTRAVENOUS | Status: AC

## 2015-07-17 MED ORDER — ALBUTEROL SULFATE (2.5 MG/3ML) 0.083% IN NEBU
2.5000 mg | INHALATION_SOLUTION | Freq: Once | RESPIRATORY_TRACT | Status: AC
  Administered 2015-07-17: 2.5 mg via RESPIRATORY_TRACT
  Filled 2015-07-17: qty 3

## 2015-07-17 MED ORDER — SODIUM CHLORIDE 0.9% FLUSH: 3.0000 mL | INTRAVENOUS | Status: DC | PRN

## 2015-07-17 MED ORDER — DILTIAZEM HCL 25 MG/5ML IV SOLN
INTRAVENOUS | Status: AC
  Filled 2015-07-17: qty 5

## 2015-07-17 MED ORDER — MELOXICAM 7.5 MG PO TABS
7.5000 mg | ORAL_TABLET | Freq: Every day | ORAL | Status: DC
Start: 2015-07-17 — End: 2015-07-19
  Administered 2015-07-17 – 2015-07-18 (×2): 7.5 mg via ORAL
  Filled 2015-07-17 (×3): qty 1

## 2015-07-17 MED ORDER — SODIUM CHLORIDE 0.9 % IV SOLN: 250.0000 mL | INTRAVENOUS | Status: DC | PRN

## 2015-07-17 MED ORDER — LEVALBUTEROL HCL 1.25 MG/0.5ML IN NEBU
1.2500 mg | INHALATION_SOLUTION | Freq: Four times a day (QID) | RESPIRATORY_TRACT | Status: DC | PRN
  Administered 2015-07-19: 1.25 mg via RESPIRATORY_TRACT
  Filled 2015-07-17: qty 0.5

## 2015-07-17 MED ORDER — FUROSEMIDE 20 MG PO TABS: 20.0000 mg | ORAL_TABLET | Freq: Every day | ORAL | Status: DC | PRN

## 2015-07-17 MED ORDER — IPRATROPIUM-ALBUTEROL 0.5-2.5 (3) MG/3ML IN SOLN
3.0000 mL | Freq: Three times a day (TID) | RESPIRATORY_TRACT | Status: DC
  Administered 2015-07-18 – 2015-07-19 (×3): 3 mL via RESPIRATORY_TRACT
  Filled 2015-07-17 (×3): qty 3

## 2015-07-17 MED ORDER — RIVAROXABAN 15 MG PO TABS
15.0000 mg | ORAL_TABLET | Freq: Every day | ORAL | Status: DC
  Administered 2015-07-17: 15 mg via ORAL
  Filled 2015-07-17: qty 1

## 2015-07-17 MED ORDER — HYDROCODONE-ACETAMINOPHEN 5-325 MG PO TABS
1.0000 | ORAL_TABLET | ORAL | Status: DC | PRN
  Administered 2015-07-17 – 2015-07-19 (×10): 1 via ORAL
  Filled 2015-07-17 (×10): qty 1

## 2015-07-17 MED ORDER — DILTIAZEM HCL 100 MG IV SOLR: 5.0000 mg/h | INTRAVENOUS | Status: DC

## 2015-07-17 MED ORDER — SODIUM CHLORIDE 0.9% FLUSH
3.0000 mL | INTRAVENOUS | Status: DC | PRN
  Administered 2015-07-17: 3 mL via INTRAVENOUS
  Filled 2015-07-17: qty 3

## 2015-07-17 MED ORDER — SODIUM CHLORIDE 0.9 % IV SOLN: 250.0000 mL | INTRAVENOUS | Status: DC

## 2015-07-17 NOTE — ED Notes (Signed)
Pt sitting on the side of the bed, pt urinated on herself. Pt cleaned, new gown & linens replaced.

## 2015-07-17 NOTE — Consult Note (Signed)
CARDIOLOGY CONSULT NOTE   Patient ID: Lindsay Salazar MRN: [id number] DOB/AGE: 67/07/1950 67 y.o.  Admit Date: 07/17/2015 Referring Physician: Lulu Riding MD Primary Physician: [name], PA-C Consulting Cardiologist: Rozann Lesches MD Primary Cardiologist: [name] MD Reason for Consultation: Atrial fib with RVR  Clinical Summary Lindsay Salazar is a 65 y.o.female with known history of atrial fib/flutter, CHADS VASC Score of 3, on Xarelto, hypertension, COPD, and fibromyalgia. She is scheduled for DCCV as OP tomorrow Wed, 07/18/2015 with Dr. Johnsie Cancel due to symptomatic atrial fibrillation with fatigue, highly sensitive to irregular heart rate, and associated dyspnea. She was seen in our office Friday 07/13/2015 and was planned for aforementioned DCCV per Dr. Harl Bowie.  She presents with dyspnea, palpitations, and recent near syncopal event that occurred last night. She indicates that her window unit air conditioner when out and it was very hot in her bedroom. She awoke short of breath and diaphoretic, felt her heart racing, went to the kitchen to do a breathing treatment, and felt very dizzy, falling over onto her kitchen table. She did not have syncope. She also states she was dreaming about the DCCV and got scared. Called EMS. Was given neb treatment on route by EMS.  In ER BP 126/74, HR 105, O2 Sat 98%. Pertinent labs Pro-BNP 415. Hgb 11.0, Hct 33.4. Troponin 0.07, Chest X-Ray demonstrated vascular congestion, mild pulmonary edema, COPD. She was treated with Neb tx. She is being treated for pneumonia, and COPD exacerbation with steroids and antibiotics.    Allergies  Allergen Reactions  . Ibuprofen Nausea And Vomiting  . Penicillins Swelling    Has patient had a PCN reaction causing immediate rash, facial/tongue/throat swelling, SOB or lightheadedness with hypotension: Yes Has patient had a PCN reaction causing severe rash involving mucus membranes or skin necrosis: No Has patient  had a PCN reaction that required hospitalization No Has patient had a PCN reaction occurring within the last 10 years: No If all of the above answers are "NO", then may proceed with Cephalosporin use.   . Tiotropium Bromide Monohydrate Itching  . Tramadol Nausea And Vomiting  . Vicodin [Hydrocodone-Acetaminophen] Nausea And Vomiting    Medications Scheduled Medications: . antiseptic oral rinse  7 mL Mouth Rinse BID  . diltiazem  120 mg Oral Daily  . escitalopram  10 mg Oral QPM  . ipratropium-albuterol  3 mL Nebulization Q6H  . levofloxacin (LEVAQUIN) IV  750 mg Intravenous Q48H  . meloxicam  7.5 mg Oral QHS  . methylPREDNISolone (SOLU-MEDROL) injection  80 mg Intravenous Q8H  . mometasone-formoterol  2 puff Inhalation BID  . rivaroxaban  15 mg Oral Daily  . sodium chloride flush  3 mL Intravenous Q12H  . sodium chloride flush  3 mL Intravenous Q12H  . sodium chloride flush  3 mL Intravenous Q12H   . sodium chloride      PRN Medications: sodium chloride, acetaminophen **OR** acetaminophen, diltiazem, furosemide, HYDROcodone-homatropine, levalbuterol, sodium chloride flush, sodium chloride flush   Past Medical History  Diagnosis Date  . COPD (chronic obstructive pulmonary disease) (HCC)     not on home o2  . Fibromyalgia   . Hypertension   . Chronic right hip pain   . Atrial fibrillation [facility])     Past Surgical History  Procedure Laterality Date  . Neck surgery    . Tonsillectomy    . Tubal ligation      Family History  Problem Relation Age of Onset  . COPD Mother   .  Diabetes Father   . Stroke Father     Social History Lindsay Salazar reports that she has been smoking Cigarettes.  She has a 1.5 pack-year smoking history. She does not have any smokeless tobacco history on file. Lindsay Salazar reports that she does not drink alcohol.  Review of Systems Complete review of systems are found to be negative unless outlined in H&P above.Chronic hip pain.  Physical  Examination Blood pressure 138/73, pulse 115, temperature 98.2 F (36.8 C), temperature source Oral, resp. rate 24, height 5\' 4"  (1.626 m), weight 138 lb (62.596 kg), SpO2 96 %.  Intake/Output Summary (Last 24 hours) at 07/17/15 1504 Last data filed at 07/17/15 1303  Gross per 24 hour  Intake    510 ml  Output    325 ml  Net    185 ml    Telemetry:Atrial fibrillation, rates in the 90-100's.   GEN: No acute distress, looking older than stated age.  HEENT: Conjunctiva and lids normal, oropharynx clear with moist mucosa. Neck: Supple, no elevated JVP or carotid bruits, no thyromegaly. Lungs: Diminished breath sounds, no wheezes. No coughing.  Cardiac: Irregular rate and rhythm, no S3 or significant systolic murmur, no pericardial rub. Abdomen: Soft, nontender, no hepatomegaly, bowel sounds present, no guarding or rebound. Extremities: No pitting edema, distal pulses 2+.Right leg larger than the left, but no pitting edema.  Skin: Warm and dry. Musculoskeletal: No kyphosis. Neuropsychiatric: Alert and oriented x3, affect grossly appropriate.  Prior Cardiac Testing/Procedures Left ventricle: The cavity size was normal. Wall thickness was  normal. Systolic function was normal. The estimated ejection  fraction was 55%. There is akinesis of the midanteroseptal  myocardium. The study is not technically sufficient to allow  evaluation of LV diastolic function. - Aortic valve: Mildly calcified annulus. Trileaflet. There was  trivial regurgitation. - Mitral valve: Mildly thickened leaflets . There was mild  regurgitation. - Left atrium: The atrium was mildly dilated. - Right ventricle: Systolic function was low normal. - Right atrium: The atrium was mildly dilated. Central venous  pressure (est): 8 mm Hg. - Tricuspid valve: There was moderate regurgitation. - Pulmonary arteries: PA peak pressure: 41 mm Hg (S). - Pericardium, extracardiac: There was no pericardial effusion.  Lab  Results  Basic Metabolic Panel:  Recent Labs Lab 07/17/15 0358  NA 140  K 4.1  CL 108  CO2 27  GLUCOSE 119*  BUN 30*  CREATININE 0.95  CALCIUM 8.2*    CBC:  Recent Labs Lab 07/17/15 0358  WBC 15.1*  NEUTROABS 12.8*  HGB 11.0*  HCT 33.4*  MCV 97.4  PLT 170    Cardiac Enzymes:  Recent Labs Lab 07/17/15 0358 07/17/15 0903 07/17/15 1342  TROPONINI 0.07* 0.20* 0.15*    Radiology: Dg Chest 2 View  07/17/2015  CLINICAL DATA:  Acute onset of shortness of breath and congestion. Lower extremity swelling. Initial encounter. EXAM: CHEST  2 VIEW COMPARISON:  Chest radiograph performed 06/27/2015 FINDINGS: The lungs are hyperexpanded, with flattening of the hemidiaphragms, compatible with COPD. Vascular congestion is noted. Increased interstitial markings raise concern for mild interstitial edema. There is no evidence of focal opacification, pleural effusion or pneumothorax. The heart is normal in size; the mediastinal contour is within normal limits. No acute osseous abnormalities are seen. Cervical spinal fusion hardware is noted. IMPRESSION: 1. Vascular congestion. Increased interstitial markings raise concern for mild pulmonary edema. 2. Findings of COPD. Electronically Signed   By: Garald Balding M.D.   On: 07/17/2015 03:56  ECG: Atrial fib with rate of 114 bpm. Non-specific lateral T-wave abnormalities.   Echocardiogram 06/28/2015: Study Conclusions  - Left ventricle: The cavity size was normal. Wall thickness was  normal. Systolic function was normal. The estimated ejection  fraction was 55%. There is akinesis of the midanteroseptal  myocardium. The study is not technically sufficient to allow  evaluation of LV diastolic function. - Aortic valve: Mildly calcified annulus. Trileaflet. There was  trivial regurgitation. - Mitral valve: Mildly thickened leaflets . There was mild  regurgitation. - Left atrium: The atrium was mildly dilated. - Right ventricle:  Systolic function was low normal. - Right atrium: The atrium was mildly dilated. Central venous  pressure (est): 8 mm Hg. - Tricuspid valve: There was moderate regurgitation. - Pulmonary arteries: PA peak pressure: 41 mm Hg (S). - Pericardium, extracardiac: There was no pericardial effusion.  Impressions:  - Normal LV wall thickness with LVEF approximately 55%. There is  akinesis of the mid anteroseptal wall. Indeterminate diastolic  function in the setting of atrial fibrillation/flutter. Mild  biatrial enlargement. Mildly thickened mitral leaflets with mild  mitral regurgitation. Trivial aortic regurgitation. Low normal RV  contraction. Moderate tricuspid regurgitation with PASP 41 mmHg.  Impression and Recommendations  1. Persistent atrial fib with RVR: Recommend that she be placed back on home dose of diltiazem 120 mg daily. Continue Xarelto. Should still be able to have DCCV completed tomorrow, but now as IP.    2. Elevated troponin I: Increased from 0.03, 0.07 and now 0.0. Not consistent with ACS. Likely from demand ischemia is the setting of hypoxia and rapid HR.   2. Pulmonary Edema by CXR: Likely from elevated HR. She is on lasix 20 mg daily po. May give one dose of IV lasix. Breathing is better however. No evidence of edema. Sat's 97%. No recorded I/O. Repeat CXR  3. COPD: Being treated with steroids, abx, and neb treatments.   4. Ongoing tobacco abuse: Cut down to 3 cigarettes a day.   Signed: Phill Myron. Lawrence NP [place]  07/17/2015, 3:04 PM Co-Sign MD  Attending note:  Patient seen and examined. Reviewed records and discussed the case with Ms. Lawrence NP. Patient of Dr. Harl Bowie, recently seen in the office on June 6 with persistent symptomatic atrial fibrillation. She has been treated with Xarelto with CHADSVASC score of 3 as well as Cardizem CD for heart rate control. Planned at that time for her to undergo an elective outpatient DCCV with Dr. Johnsie Cancel tomorrow.  She presents to the hospital after episode of worsening dyspnea, palpitations, also near syncope. She states that her home air-conditioning window unit when out and she was very hot in her bedroom. Woke up at night with the above symptoms, went to the kitchen for a breathing treatment and was dizzy while standing, fell over onto her kitchen table, no loss of consciousness however. Primary service treating for possible component of COPD exacerbation, although she did have mild interstitial edema by chest x-ray as well. LVEF normal range.  On examination this afternoon she appears comfortable, no wheezing but prolonged expiratory phase, heart rate is in the 90 to 100 range in atrial fibrillation. Creatinine 0.95, troponin I 0.07, 0.20, and 0.15. ACS is not suspected, most likely demand ischemia in the setting of elevated heart rates. His x-ray did show interstitial edema and chronic COPD findings.  Persistent symptomatic atrial fibrillation. Patient was already scheduled for an outpatient cardioversion for tomorrow, will keep this scheduled as an inpatient. Stay on Xarelto and  Cardizem CD.  Satira Sark, M.D., F.A.C.C.

## 2015-07-17 NOTE — ED Notes (Signed)
Pt c/o sob x 1 month with tonight getting worse;

## 2015-07-17 NOTE — ED Notes (Signed)
MD at bedside. 

## 2015-07-17 NOTE — Progress Notes (Signed)
Patient seen and examined  65 y.o.female with known history of atrial fib/flutter, CHADS VASC Score of 3, on Xarelto, hypertension, COPD, and fibromyalgia., Presents with acute on chronic COPD exacerbation as well as atrial fibrillation with rapid ventricular response. Patient currently receiving IV steroids, nebulizer treatments, cardiology has been consulted and plan is to DCCV cardioversion tomorrow

## 2015-07-17 NOTE — Progress Notes (Addendum)
Pharmacy Antibiotic Note  Lindsay Salazar is a 65 y.o. female admitted on 07/17/2015 with pneumonia.  Pharmacy has been consulted for levaquin and xarelto dosing.  Plan: Levaquin 750 mg IV q48 hours Cont home dose xarelto 15 mg po daily F/u cultures and clinical course  Height: 5\' 4"  (162.6 cm) Weight: 138 lb (62.596 kg) IBW/kg (Calculated) : 54.7  Temp (24hrs), Avg:97.8 F (36.6 C), Min:97.4 F (36.3 C), Max:98.2 F (36.8 C)   Recent Labs Lab 07/17/15 0358  WBC 15.1*  CREATININE 0.95    Estimated Creatinine Clearance: 49.6 mL/min (by C-G formula based on Cr of 0.95).    Allergies  Allergen Reactions  . Ibuprofen Nausea And Vomiting  . Penicillins Swelling    Has patient had a PCN reaction causing immediate rash, facial/tongue/throat swelling, SOB or lightheadedness with hypotension: Yes Has patient had a PCN reaction causing severe rash involving mucus membranes or skin necrosis: No Has patient had a PCN reaction that required hospitalization No Has patient had a PCN reaction occurring within the last 10 years: No If all of the above answers are "NO", then may proceed with Cephalosporin use.   . Tiotropium Bromide Monohydrate Itching  . Tramadol Nausea And Vomiting  . Vicodin [Hydrocodone-Acetaminophen] Nausea And Vomiting    Antimicrobials this admission: levaquin 6/13 >>   Thank you for allowing pharmacy to be a part of this patient's care.  [name] 07/17/2015 10:44 AM  Addum:  CrCl is >50 today so will adjust xarelto dose to 20 mg daily and levaquin dose to 750 mg IV daily .

## 2015-07-17 NOTE — ED Provider Notes (Signed)
CSN: [id number]     Arrival date & time 07/17/15  0225 History   First MD Initiated Contact with Patient 07/17/15 0330     Chief Complaint  Patient presents with  . Shortness of Breath     (Consider location/radiation/quality/duration/timing/severity/associated sxs/prior Treatment) Patient is a 65 y.o. female presenting with shortness of breath. The history is provided by the patient.  Shortness of Breath She has a history of COPD and persistent atrial fibrillation. She is scheduled for elective cardioversion tomorrow. She woke up tonight with difficulty breathing and diaphoresis. She walked to get her inhaler and had brief syncopal episode. She denies chest pain, heaviness, tightness, pressure. She has had a cough productive of some clear to yellowish sputum. She denies nausea, vomiting. She does smoke cigarettes, but is down to 3 a day. She received a nebulizer treatment in the ambulance coming in with partial relief.  Past Medical History  Diagnosis Date  . COPD (chronic obstructive pulmonary disease) ([place])   . Fibromyalgia   . Hypertension   . Chronic right hip pain    Past Surgical History  Procedure Laterality Date  . Neck surgery    . Tonsillectomy    . Tubal ligation     History reviewed. No pertinent family history. Social History  Substance Use Topics  . Smoking status: Current Some Day Smoker -- 0.25 packs/day    Types: Cigarettes  . Smokeless tobacco: None  . Alcohol Use: No   OB History    No data available     Review of Systems  Respiratory: Positive for shortness of breath.   All other systems reviewed and are negative.     Allergies  Ibuprofen; Penicillins; Tiotropium bromide monohydrate; Tramadol; and Vicodin  Home Medications   Prior to Admission medications   Medication Sig Start Date End Date Taking? Authorizing Provider  albuterol (PROVENTIL) (2.5 MG/3ML) 0.083% nebulizer solution Take 2.5 mg by nebulization every 6 (six) hours as needed for  wheezing or shortness of breath.    Historical Provider, MD  albuterol (VENTOLIN HFA) 108 (90 BASE) MCG/ACT inhaler Inhale 2 puffs into the lungs 2 (two) times daily. For shortness of breath    Historical Provider, MD  albuterol-ipratropium (COMBIVENT) 18-103 MCG/ACT inhaler Inhale 2 puffs into the lungs 2 (two) times daily. For shortness of breath    Historical Provider, MD  budesonide-formoterol (SYMBICORT) 80-4.5 MCG/ACT inhaler Inhale 2 puffs into the lungs 2 (two) times daily.    Historical Provider, MD  diltiazem (CARDIZEM CD) 120 MG 24 hr capsule Take 1 capsule (120 mg total) by mouth 2 (two) times daily. 07/06/15   Arnoldo Lenis, MD  escitalopram (LEXAPRO) 10 MG tablet Take 10 mg by mouth every evening.     Historical Provider, MD  furosemide (LASIX) 20 MG tablet Take 1 tablet (20 mg total) by mouth daily as needed for edema. 07/06/15   Arnoldo Lenis, MD  HYDROcodone-acetaminophen (NORCO/VICODIN) 5-325 MG tablet Take 1 tablet by mouth every 4 (four) hours as needed for moderate pain (Must last 30 days.  Do not take and drive a car or use machinery.). 06/21/15   Sanjuana Kava, MD  meloxicam (MOBIC) 7.5 MG tablet Take 7.5 mg by mouth at bedtime.    Historical Provider, MD  predniSONE (DELTASONE) 5 MG tablet Take 1 tablet (5 mg total) by mouth daily with breakfast. 06/30/15   Donne Hazel, MD  Rivaroxaban (XARELTO) 15 MG TABS tablet Take 1 tablet (15 mg total) by mouth daily  with supper. 06/30/15   Donne Hazel, MD   BP 126/74 mmHg  Pulse 105  Temp(Src) 97.4 F (36.3 C) (Oral)  Resp 25  Ht 5\' 4"  (1.626 m)  Wt 138 lb (62.596 kg)  BMI 23.68 kg/m2  SpO2 98% Physical Exam  Nursing note and vitals reviewed.  65 year old female, resting comfortably and in no acute distress. Vital signs are significant for mild tachycardia, and tachypnea. Oxygen saturation is 98%, which is normal. Head is normocephalic and atraumatic. PERRLA, EOMI. Oropharynx is clear. Neck is nontender and supple  without adenopathy or JVD. Back is nontender and there is no CVA tenderness. Lungs have mild, diffuse wheezes without rales or rhonchi. Chest is nontender. Heart has an irregular rhythm without murmur. Abdomen is soft, flat, nontender without masses or hepatosplenomegaly and peristalsis is normoactive. Extremities have 2+ edema, full range of motion is present. Skin is warm and dry without rash. Neurologic: Mental status is normal, cranial nerves are intact, there are no motor or sensory deficits.  ED Course  Procedures (including critical care time) Labs Review Results for orders placed or performed during the hospital encounter of [id number]  Basic metabolic panel  Result Value Ref Range   Sodium 140 135 - 145 mmol/L   Potassium 4.1 3.5 - 5.1 mmol/L   Chloride 108 101 - 111 mmol/L   CO2 27 22 - 32 mmol/L   Glucose, Bld 119 (H) 65 - 99 mg/dL   BUN 30 (H) 6 - 20 mg/dL   Creatinine, Ser 0.95 0.44 - 1.00 mg/dL   Calcium 8.2 (L) 8.9 - 10.3 mg/dL   GFR calc non Af Amer >60 >60 mL/min   GFR calc Af Amer >60 >60 mL/min   Anion gap 5 5 - 15  Brain natriuretic peptide  Result Value Ref Range   B Natriuretic Peptide 415.0 (H) 0.0 - 100.0 pg/mL  CBC with Differential  Result Value Ref Range   WBC 15.1 (H) 4.0 - 10.5 K/uL   RBC 3.43 (L) 3.87 - 5.11 MIL/uL   Hemoglobin 11.0 (L) 12.0 - 15.0 g/dL   HCT 33.4 (L) 36.0 - 46.0 %   MCV 97.4 78.0 - 100.0 fL   MCH 32.1 26.0 - 34.0 pg   MCHC 32.9 30.0 - 36.0 g/dL   RDW 16.8 (H) 11.5 - 15.5 %   Platelets 170 150 - 400 K/uL   Neutrophils Relative % 84 %   Neutro Abs 12.8 (H) 1.7 - 7.7 K/uL   Lymphocytes Relative 10 %   Lymphs Abs 1.5 0.7 - 4.0 K/uL   Monocytes Relative 5 %   Monocytes Absolute 0.8 0.1 - 1.0 K/uL   Eosinophils Relative 1 %   Eosinophils Absolute 0.1 0.0 - 0.7 K/uL   Basophils Relative 0 %   Basophils Absolute 0.0 0.0 - 0.1 K/uL  Troponin I  Result Value Ref Range   Troponin I 0.07 (H) <0.031 ng/mL   Imaging Review Dg  Chest 2 View  07/17/2015  CLINICAL DATA:  Acute onset of shortness of breath and congestion. Lower extremity swelling. Initial encounter. EXAM: CHEST  2 VIEW COMPARISON:  Chest radiograph performed 06/27/2015 FINDINGS: The lungs are hyperexpanded, with flattening of the hemidiaphragms, compatible with COPD. Vascular congestion is noted. Increased interstitial markings raise concern for mild interstitial edema. There is no evidence of focal opacification, pleural effusion or pneumothorax. The heart is normal in size; the mediastinal contour is within normal limits. No acute osseous abnormalities are seen. Cervical spinal  fusion hardware is noted. IMPRESSION: 1. Vascular congestion. Increased interstitial markings raise concern for mild pulmonary edema. 2. Findings of COPD. Electronically Signed   By: Garald Balding M.D.   On: 07/17/2015 03:56   I have personally reviewed and evaluated these images and lab results as part of my medical decision-making.   EKG Interpretation   Date/Time:  Tuesday July 17 2015 02:36:09 EDT Ventricular Rate:  118 PR Interval:    QRS Duration: 103 QT Interval:  371 QTC Calculation: 520 R Axis:   81 Text Interpretation:  Atrial fibrillation Borderline right axis deviation  Borderline low voltage, extremity leads Repol abnrm suggests ischemia,  diffuse leads Prolonged QT interval When compared with ECG of 06/27/2015,  QT has lengthened Left anterior fasicular block is no longer Present  Confirmed by [facility]  MD, Tayquan Gassman ([id number]) on 07/17/2015 3:34:18 AM      MDM   Final diagnoses:  COPD exacerbation (HCC)  Persistent atrial fibrillation with rapid ventricular response (HCC)  Elevated troponin I level  Elevated BUN  Elevated brain natriuretic peptide (BNP) level  Normochromic normocytic anemia    Dyspnea which appears to be COPD exacerbation. Cough-rule out pneumonia. Persistent atrial fibrillation. Old records are reviewed and she is being followed by cardiology  with elective cardioversion scheduled for tomorrow. She is anticoagulated on apixaban. Chest x-ray will be obtained to rule out pneumonia. She'll be given additional albuterol via nebulizer. ECG shows no acute changes although, curiously, previously present left anterior fascicular block is no longer seen. Will need at least 2 troponins given her a syncopal episode. CHADSvasc score = 3.  Following nebulizer treatment, lungs are completely clear and patient states that she is feeling somewhat better. Chest x-ray shows some pulmonary vascular congestion. BNP is slightly elevated, but is significantly decreased from most recent value. Troponin I is slightly elevated at 0.07, which most likely represents demand ischemia. BUN is elevated but unchanged from baseline. Because of elevated troponin, she will be kept in the hospital for serial troponins. I don't anticipate any change in the plan to cardiovert her tomorrow. She is already anticoagulated, so heparin is not initiated. Case is discussed with Dr. Maudie Mercury of [facility] who agrees to admit the patient under observation status.  Delora Fuel, [place] [id number]

## 2015-07-17 NOTE — H&P (Signed)
TRH H&P   Patient Demographics:    Lindsay Salazar, is a 65 y.o. female  MRN: [id number]   DOB - October 20, 1950  Admit Date - 07/17/2015  Outpatient Primary MD for the patient is [name], PA-C  Referring MD/NP/PA: Roxanne Mins  Outpatient Specialists: Dr. Harl Bowie (cardiologist)    Patient coming from: home  Chief Complaint  Patient presents with  . Shortness of Breath      HPI:    Lindsay Salazar  is a 65 y.o. female, w Copd (not on home o2), Tobacco use, Atrial fibrillation, apparently went to bathroom, and passed out.  Just a few sec.  + dyspnea. Slight cough, white sputum.   Denies fever, chills, cp, palp, n/v, diarrhea, dysuria, hematuria, brbpr, black stool. Pt notes scheduled to have cardioversion in [place] tomorrow.     Review of systems:    In addition to the HPI above,  No Fever-chills, No Headache, No changes with Vision or hearing, No problems swallowing food or Liquids, No Chest pain, No Abdominal pain, No Nausea or Vommitting, Bowel movements are regular, No Blood in stool or Urine, No dysuria, No new skin rashes or bruises, No new joints pains-aches,  No new weakness, tingling, numbness in any extremity, No recent weight gain or loss, No polyuria, polydypsia or polyphagia, No significant Mental Stressors.  A full 10 point Review of Systems was done, except as stated above, all other Review of Systems were negative.   With Past History of the following :    Past Medical History  Diagnosis Date  . COPD (chronic obstructive pulmonary disease) (HCC)     not on home o2  . Fibromyalgia   . Hypertension   . Chronic right hip pain   . Atrial fibrillation [facility])       Past Surgical History  Procedure Laterality Date  . Neck surgery    . Tonsillectomy    . Tubal ligation        Social History:     Social History  Substance Use Topics  . Smoking  status: Current Some Day Smoker -- 0.25 packs/day for 6 years    Types: Cigarettes  . Smokeless tobacco: Not on file  . Alcohol Use: No     Lives - at home w boyfriend  Mobility - walks unassisted     Family History :     Family History  Problem Relation Age of Onset  . COPD Mother   . Diabetes Father   . Stroke Father       Home Medications:   Prior to Admission medications   Medication Sig Start Date End Date Taking? Authorizing Provider  albuterol (PROVENTIL) (2.5 MG/3ML) 0.083% nebulizer solution Take 2.5 mg by nebulization every 6 (six) hours as needed for wheezing or shortness of breath.    Historical Provider, MD  albuterol (VENTOLIN HFA) 108 (90 BASE) MCG/ACT inhaler  Inhale 2 puffs into the lungs 2 (two) times daily. For shortness of breath    Historical Provider, MD  albuterol-ipratropium (COMBIVENT) 18-103 MCG/ACT inhaler Inhale 2 puffs into the lungs 2 (two) times daily. For shortness of breath    Historical Provider, MD  budesonide-formoterol (SYMBICORT) 80-4.5 MCG/ACT inhaler Inhale 2 puffs into the lungs 2 (two) times daily.    Historical Provider, MD  diltiazem (CARDIZEM CD) 120 MG 24 hr capsule Take 1 capsule (120 mg total) by mouth 2 (two) times daily. 07/06/15   Arnoldo Lenis, MD  escitalopram (LEXAPRO) 10 MG tablet Take 10 mg by mouth every evening.     Historical Provider, MD  furosemide (LASIX) 20 MG tablet Take 1 tablet (20 mg total) by mouth daily as needed for edema. 07/06/15   Arnoldo Lenis, MD  HYDROcodone-acetaminophen (NORCO/VICODIN) 5-325 MG tablet Take 1 tablet by mouth every 4 (four) hours as needed for moderate pain (Must last 30 days.  Do not take and drive a car or use machinery.). 06/21/15   Sanjuana Kava, MD  meloxicam (MOBIC) 7.5 MG tablet Take 7.5 mg by mouth at bedtime.    Historical Provider, MD  predniSONE (DELTASONE) 5 MG tablet Take 1 tablet (5 mg total) by mouth daily with breakfast. 06/30/15   Donne Hazel, MD  Rivaroxaban  (XARELTO) 15 MG TABS tablet Take 1 tablet (15 mg total) by mouth daily with supper. 06/30/15   Donne Hazel, MD     Allergies:     Allergies  Allergen Reactions  . Ibuprofen Nausea And Vomiting  . Penicillins Swelling  . Tiotropium Bromide Monohydrate Itching  . Tramadol Nausea And Vomiting  . Vicodin [Hydrocodone-Acetaminophen] Nausea And Vomiting     Physical Exam:   Vitals  Blood pressure 125/77, pulse 99, temperature 97.4 F (36.3 C), temperature source Oral, resp. rate 22, height 5\' 4"  (1.626 m), weight 62.596 kg (138 lb), SpO2 96 %.   1. General  lying in bed in NAD,    2. Normal affect and insight, Not Suicidal or Homicidal, Awake Alert, Oriented X 3.  3. No F.N deficits, ALL C.Nerves Intact, Strength 5/5 all 4 extremities, Sensation intact all 4 extremities, Plantars down going.  4. Ears and Eyes appear Normal, Conjunctivae clear, PERRLA. Moist Oral Mucosa.  5. Supple Neck, No JVD, No cervical lymphadenopathy appriciated, No Carotid Bruits.  6. Symmetrical Chest wall movement, Good air movement bilaterally, CTAB.  7. Irr, irr, s1, s2  No Gallops, Rubs.  1/6 sem apex No Parasternal Heave.  8. Positive Bowel Sounds, Abdomen Soft, No tenderness, No organomegaly appriciated,No rebound -guarding or rigidity.  9.  No Cyanosis, Normal Skin Turgor, No Skin Rash or Bruise.  10. Good muscle tone,  joints appear normal , no effusions, Normal ROM.  11. No Palpable Lymph Nodes in Neck or Axillae     Data Review:    CBC  Recent Labs Lab 07/17/15 0358  WBC 15.1*  HGB 11.0*  HCT 33.4*  PLT 170  MCV 97.4  MCH 32.1  MCHC 32.9  RDW 16.8*  LYMPHSABS 1.5  MONOABS 0.8  EOSABS 0.1  BASOSABS 0.0   ------------------------------------------------------------------------------------------------------------------  Chemistries   Recent Labs Lab 07/17/15 0358  NA 140  K 4.1  CL 108  CO2 27  GLUCOSE 119*  BUN 30*  CREATININE 0.95  CALCIUM 8.2*    ------------------------------------------------------------------------------------------------------------------ estimated creatinine clearance is 49.6 mL/min (by C-G formula based on Cr of 0.95). ------------------------------------------------------------------------------------------------------------------ No results for input(s): TSH,  T4TOTAL, T3FREE, THYROIDAB in the last 72 hours.  Invalid input(s): FREET3  Coagulation profile No results for input(s): INR, PROTIME in the last 168 hours. ------------------------------------------------------------------------------------------------------------------- No results for input(s): DDIMER in the last 72 hours. -------------------------------------------------------------------------------------------------------------------  Cardiac Enzymes  Recent Labs Lab 07/17/15 0358  TROPONINI 0.07*   ------------------------------------------------------------------------------------------------------------------    Component Value Date/Time   BNP 415.0* 07/17/2015 0358     ---------------------------------------------------------------------------------------------------------------  Urinalysis No results found for: COLORURINE, APPEARANCEUR, LABSPEC, PHURINE, GLUCOSEU, HGBUR, BILIRUBINUR, KETONESUR, PROTEINUR, UROBILINOGEN, NITRITE, LEUKOCYTESUR  ----------------------------------------------------------------------------------------------------------------   Imaging Results:    Dg Chest 2 View  07/17/2015  CLINICAL DATA:  Acute onset of shortness of breath and congestion. Lower extremity swelling. Initial encounter. EXAM: CHEST  2 VIEW COMPARISON:  Chest radiograph performed 06/27/2015 FINDINGS: The lungs are hyperexpanded, with flattening of the hemidiaphragms, compatible with COPD. Vascular congestion is noted. Increased interstitial markings raise concern for mild interstitial edema. There is no evidence of focal opacification,  pleural effusion or pneumothorax. The heart is normal in size; the mediastinal contour is within normal limits. No acute osseous abnormalities are seen. Cervical spinal fusion hardware is noted. IMPRESSION: 1. Vascular congestion. Increased interstitial markings raise concern for mild pulmonary edema. 2. Findings of COPD. Electronically Signed   By: Garald Balding M.D.   On: 07/17/2015 03:56       Assessment & Plan:    Principal Problem:   Atrial fibrillation with RVR (HCC) Active Problems:   Elevated troponin I level   Hyperglycemia   Anemia    1. Afib with RVR Cardizem gtt Trop i q6h x3,  Cardiac echo Cardiology consult ordered  2.  Copd exacerbation Solumedrol 80mg  iv q8h Albuterol 1 neb q6h and q6h prn atrovent 1 neb q6h levaquin 500mg  iv qday  3. Anemia Check cbc in am  4. Hyperglycemia Check hga1c  5. Tobacco dep Pt counselled on smoking cessation x 3 minutes.   DVT Prophylaxis  Lovenox - SCDs   AM Labs Ordered, also please review Full Orders  Family Communication: Admission, patients condition and plan of care including tests being ordered have been discussed with the patient who indicate understanding and agree with the plan and Code Status.  Code Status full code  Likely DC to  home  Condition GUARDED    Consults called: cardiology consult ordered  Admission status: inpatient  Time spent in minutes : 45 minutes   Jani Gravel M.D on 07/17/2015 at 6:22 AM  Between 7am to 7pm - Pager - [phone]. After 7pm go to [email or web address] - password [facility]  [facility] - Office  [phone]

## 2015-07-18 ENCOUNTER — Other Ambulatory Visit: Payer: Self-pay

## 2015-07-18 ENCOUNTER — Ambulatory Visit (HOSPITAL_COMMUNITY): Admission: RE | Admit: 2015-07-18 | Payer: Medicare HMO | Source: Ambulatory Visit | Admitting: Cardiovascular Disease

## 2015-07-18 ENCOUNTER — Inpatient Hospital Stay (HOSPITAL_COMMUNITY): Payer: Medicare HMO | Admitting: Anesthesiology

## 2015-07-18 ENCOUNTER — Encounter (HOSPITAL_COMMUNITY): Payer: Self-pay | Admitting: *Deleted

## 2015-07-18 ENCOUNTER — Encounter (HOSPITAL_COMMUNITY)
Admission: EM | Disposition: A | Discharge status: Home Health | Payer: Self-pay | Source: Home / Self Care | Attending: Internal Medicine

## 2015-07-18 DIAGNOSIS — J441 Chronic obstructive pulmonary disease with (acute) exacerbation: Secondary | ICD-10-CM

## 2015-07-18 DIAGNOSIS — R0600 Dyspnea, unspecified: Secondary | ICD-10-CM

## 2015-07-18 HISTORY — PX: CARDIOVERSION: SHX1299

## 2015-07-18 LAB — COMPREHENSIVE METABOLIC PANEL
ALT: 34 U/L (ref 14–54)
ANION GAP: 6 (ref 5–15)
AST: 17 U/L (ref 15–41)
Albumin: 3.3 g/dL — ABNORMAL LOW (ref 3.5–5.0)
Alkaline Phosphatase: 61 U/L (ref 38–126)
BUN: 36 mg/dL — ABNORMAL HIGH (ref 6–20)
CHLORIDE: 104 mmol/L (ref 101–111)
CO2: 23 mmol/L (ref 22–32)
Calcium: 8.5 mg/dL — ABNORMAL LOW (ref 8.9–10.3)
Creatinine, Ser: 0.87 mg/dL (ref 0.44–1.00)
Glucose, Bld: 156 mg/dL — ABNORMAL HIGH (ref 65–99)
POTASSIUM: 3.9 mmol/L (ref 3.5–5.1)
Sodium: 133 mmol/L — ABNORMAL LOW (ref 135–145)
Total Bilirubin: 0.6 mg/dL (ref 0.3–1.2)
Total Protein: 6 g/dL — ABNORMAL LOW (ref 6.5–8.1)

## 2015-07-18 LAB — HEMOGLOBIN A1C
Hgb A1c MFr Bld: 5.8 % — ABNORMAL HIGH (ref 4.8–5.6)
Mean Plasma Glucose: 120 mg/dL

## 2015-07-18 LAB — CBC
HCT: 32.2 % — ABNORMAL LOW (ref 36.0–46.0)
Hemoglobin: 10.8 g/dL — ABNORMAL LOW (ref 12.0–15.0)
MCH: 32.3 pg (ref 26.0–34.0)
MCHC: 33.5 g/dL (ref 30.0–36.0)
MCV: 96.4 fL (ref 78.0–100.0)
PLATELETS: 204 10*3/uL (ref 150–400)
RBC: 3.34 MIL/uL — AB (ref 3.87–5.11)
RDW: 16.7 % — AB (ref 11.5–15.5)
WBC: 16.2 10*3/uL — AB (ref 4.0–10.5)

## 2015-07-18 SURGERY — CARDIOVERSION
Anesthesia: Monitor Anesthesia Care

## 2015-07-18 MED ORDER — AMIODARONE IV BOLUS ONLY 150 MG/100ML
150.0000 mg | Freq: Once | INTRAVENOUS | Status: AC
  Administered 2015-07-18: 150 mg via INTRAVENOUS
  Filled 2015-07-18: qty 100

## 2015-07-18 MED ORDER — MIDAZOLAM HCL 2 MG/2ML IJ SOLN
1.0000 mg | INTRAMUSCULAR | Status: DC | PRN
Start: 2015-07-18 — End: 2015-07-18
  Administered 2015-07-18: 2 mg via INTRAVENOUS

## 2015-07-18 MED ORDER — FLUTICASONE FUROATE-VILANTEROL 200-25 MCG/INH IN AEPB
1.0000 | INHALATION_SPRAY | Freq: Every day | RESPIRATORY_TRACT | Status: DC
  Administered 2015-07-19: 1 via RESPIRATORY_TRACT
  Filled 2015-07-18: qty 28

## 2015-07-18 MED ORDER — ONDANSETRON HCL 4 MG/2ML IJ SOLN: 4.0000 mg | Freq: Once | INTRAMUSCULAR | Status: DC | PRN

## 2015-07-18 MED ORDER — FENTANYL CITRATE (PF) 100 MCG/2ML IJ SOLN
INTRAMUSCULAR | Status: DC | PRN
  Administered 2015-07-18 (×2): 25 ug via INTRAVENOUS

## 2015-07-18 MED ORDER — RIVAROXABAN 20 MG PO TABS
20.0000 mg | ORAL_TABLET | Freq: Every day | ORAL | Status: DC
  Administered 2015-07-18: 20 mg via ORAL

## 2015-07-18 MED ORDER — FENTANYL CITRATE (PF) 100 MCG/2ML IJ SOLN: 25.0000 ug | INTRAMUSCULAR | Status: DC | PRN

## 2015-07-18 MED ORDER — METHYLPREDNISOLONE SODIUM SUCC 40 MG IJ SOLR
40.0000 mg | Freq: Two times a day (BID) | INTRAMUSCULAR | Status: DC
  Administered 2015-07-18 (×2): 40 mg via INTRAVENOUS
  Filled 2015-07-18: qty 1

## 2015-07-18 MED ORDER — METOPROLOL TARTRATE 5 MG/5ML IV SOLN
INTRAVENOUS | Status: DC | PRN
  Administered 2015-07-18: 5 mg via INTRAVENOUS

## 2015-07-18 MED ORDER — MIDAZOLAM HCL 5 MG/5ML IJ SOLN
INTRAMUSCULAR | Status: DC | PRN
  Administered 2015-07-18: 1 mg via INTRAVENOUS

## 2015-07-18 MED ORDER — LEVOFLOXACIN IN D5W 750 MG/150ML IV SOLN
750.0000 mg | INTRAVENOUS | Status: DC
  Administered 2015-07-18 – 2015-07-19 (×2): 750 mg via INTRAVENOUS
  Filled 2015-07-18 (×2): qty 150

## 2015-07-18 MED ORDER — RIVAROXABAN 20 MG PO TABS: 20.0000 mg | ORAL_TABLET | Freq: Every day | ORAL | Status: DC

## 2015-07-18 MED ORDER — LACTATED RINGERS IV SOLN
INTRAVENOUS | Status: DC
  Administered 2015-07-18: 12:00:00 via INTRAVENOUS

## 2015-07-18 MED ORDER — METOPROLOL TARTRATE 5 MG/5ML IV SOLN
5.0000 mg | Freq: Once | INTRAVENOUS | Status: AC
  Administered 2015-07-18: 5 mg via INTRAVENOUS

## 2015-07-18 MED ORDER — PROPOFOL 500 MG/50ML IV EMUL
INTRAVENOUS | Status: DC | PRN
  Administered 2015-07-18: 12 ug/kg/min via INTRAVENOUS

## 2015-07-18 NOTE — Progress Notes (Signed)
[facility] PROGRESS NOTE  Lindsay Salazar [id number] DOB: 1950/12/30 DOA: 07/17/2015   PCP: [name], PA-C     Assessment/Plan: Principal Problem:   Atrial fibrillation with RVR ([place]) Active Problems:   Elevated troponin I level   Hyperglycemia   Anemia   Atrial fibrillation (HCC)   Dyspnea    65 y.o.female with known history of atrial fib/flutter, CHADS VASC Score of 3, on Xarelto, hypertension, COPD, and fibromyalgia., Presents with acute on chronic COPD exacerbation as well as atrial fibrillation with rapid ventricular response. Patient currently receiving IV steroids, nebulizer treatments, cardiology has been consulted and plan is to DCCV cardioversion 6/14   Assessment and plan 1. Afib with RVR -anticoagulated with xarelto, received 1 dose of IV Cardizem on 6/13 NPO For [facility] latter today Current rate control with cardizem EF normal only mild LAE on echo  Trop i q6h x3, slightly abnormal 0.2>0.15 DCCV today Pharmacy to dose xarelto    2. Acute COPD exacerbation Solumedrol 80mg  iv q8h, start tapering Albuterol 1 neb q6h and q6h prn atrovent 1 neb q6h levaquin 500mg  iv qday changed to by mouth  3. Anemia Slight drop in hemoglobin since yesterday 11>10.8, continue to monitor  4. Hyperglycemia likely steroid-induced Hemoglobin A1c 5.8  5. Tobacco dep Pt counselled on smoking cessation x 3 minutes.    DVT prophylaxsis Xarelto  Code Status:  Full code    Family Communication: Discussed in detail with the patient, all imaging results, lab results explained to the patient   Disposition Plan:  Disposition per cardiology     Consultants:  Cardiology*  Procedures:  None  Antibiotics: Anti-infectives    Start     Dose/Rate Route Frequency Ordered Stop   07/18/15 0900  [MAR Hold]  levofloxacin (LEVAQUIN) IVPB 750 mg     (MAR Hold since 07/18/15 0927)   750 mg 100 mL/hr over 90 Minutes Intravenous Every 24 hours 07/18/15 0856      07/17/15 0800  levofloxacin (LEVAQUIN) IVPB 750 mg  Status:  Discontinued     750 mg 100 mL/hr over 90 Minutes Intravenous Every 48 hours 07/17/15 0754 07/18/15 0856         HPI/Subjective: No chest pain or shortness of breath, still has some wheezing  Objective: Filed Vitals:   07/18/15 0750 07/18/15 0817 07/18/15 0821 07/18/15 0957  BP:      Pulse:    116  Temp:    97.8 F (36.6 C)  TempSrc:    Oral  Resp:      Height:    5\' 4"  (1.626 m)  Weight: 63.05 kg (139 lb)   63.05 kg (139 lb)  SpO2:  98% 98%     Intake/Output Summary (Last 24 hours) at 07/18/15 1106 Last data filed at 07/18/15 0550  Gross per 24 hour  Intake    600 ml  Output   1175 ml  Net   -575 ml    Exam:  Examination:  General exam: Appears calm and comfortable  Respiratory system: Clear to auscultation. Respiratory effort normal. Cardiovascular system: S1 & S2 heard, RRR. No JVD, murmurs, rubs, gallops or clicks. No pedal edema. Gastrointestinal system: Abdomen is nondistended, soft and nontender. No organomegaly or masses felt. Normal bowel sounds heard. Central nervous system: Alert and oriented. No focal neurological deficits. Extremities: Symmetric 5 x 5 power. Skin: No rashes, lesions or ulcers Psychiatry: Judgement and insight appear normal. Mood & affect appropriate.  Data Reviewed: I have personally reviewed following labs and imaging studies  Micro Results No results found for this or any previous visit (from the past 240 hour(s)).  Radiology Reports Dg Chest 2 View  07/17/2015  CLINICAL DATA:  Acute onset of shortness of breath and congestion. Lower extremity swelling. Initial encounter. EXAM: CHEST  2 VIEW COMPARISON:  Chest radiograph performed 06/27/2015 FINDINGS: The lungs are hyperexpanded, with flattening of the hemidiaphragms, compatible with COPD. Vascular congestion is noted. Increased interstitial markings raise concern for mild interstitial edema. There is no evidence of  focal opacification, pleural effusion or pneumothorax. The heart is normal in size; the mediastinal contour is within normal limits. No acute osseous abnormalities are seen. Cervical spinal fusion hardware is noted. IMPRESSION: 1. Vascular congestion. Increased interstitial markings raise concern for mild pulmonary edema. 2. Findings of COPD. Electronically Signed   By: Garald Balding M.D.   On: 07/17/2015 03:56   Dg Chest Portable 1 View  06/27/2015  CLINICAL DATA:  65 year old female with increasing shortness of breath for several days. Initial encounter. Smoker. COPD. EXAM: PORTABLE CHEST 1 VIEW COMPARISON:  09/25/2007. FINDINGS: Upright AP view of the chest at 1352 hours. New confluent right lung base opacity. Underlying chronic hyperinflation. Stable mediastinal contours. No pneumothorax or pulmonary edema. No definite pleural effusion. Chronic ACDF hardware in the cervical spine. No acute osseous abnormality identified. IMPRESSION: Patchy right lung base opacity compatible with acute infectious exacerbation superimposed on chronic lung disease with hyperinflation. No definite pleural effusion. Follow-up PA and lateral views of the chest would be helpful when possible. Electronically Signed   By: Genevie Ann M.D.   On: 06/27/2015 14:05     CBC  Recent Labs Lab 07/17/15 0358 07/18/15 0443  WBC 15.1* 16.2*  HGB 11.0* 10.8*  HCT 33.4* 32.2*  PLT 170 204  MCV 97.4 96.4  MCH 32.1 32.3  MCHC 32.9 33.5  RDW 16.8* 16.7*  LYMPHSABS 1.5  --   MONOABS 0.8  --   EOSABS 0.1  --   BASOSABS 0.0  --     Chemistries   Recent Labs Lab 07/17/15 0358 07/18/15 0443  NA 140 133*  K 4.1 3.9  CL 108 104  CO2 27 23  GLUCOSE 119* 156*  BUN 30* 36*  CREATININE 0.95 0.87  CALCIUM 8.2* 8.5*  AST  --  17  ALT  --  34  ALKPHOS  --  61  BILITOT  --  0.6   ------------------------------------------------------------------------------------------------------------------ estimated creatinine clearance is  54.2 mL/min (by C-G formula based on Cr of 0.87). ------------------------------------------------------------------------------------------------------------------  Recent Labs  07/17/15 0903  HGBA1C 5.8*   ------------------------------------------------------------------------------------------------------------------ No results for input(s): CHOL, HDL, LDLCALC, TRIG, CHOLHDL, LDLDIRECT in the last 72 hours. ------------------------------------------------------------------------------------------------------------------ No results for input(s): TSH, T4TOTAL, T3FREE, THYROIDAB in the last 72 hours.  Invalid input(s): FREET3 ------------------------------------------------------------------------------------------------------------------ No results for input(s): VITAMINB12, FOLATE, FERRITIN, TIBC, IRON, RETICCTPCT in the last 72 hours.  Coagulation profile No results for input(s): INR, PROTIME in the last 168 hours.  No results for input(s): DDIMER in the last 72 hours.  Cardiac Enzymes  Recent Labs Lab 07/17/15 0903 07/17/15 1342 07/17/15 1943  TROPONINI 0.20* 0.15* 0.09*   ------------------------------------------------------------------------------------------------------------------ Invalid input(s): POCBNP   CBG: No results for input(s): GLUCAP in the last 168 hours.     Studies: Dg Chest 2 View  07/17/2015  CLINICAL DATA:  Acute onset of shortness of breath and congestion. Lower extremity swelling. Initial encounter. EXAM: CHEST  2 VIEW COMPARISON:  Chest radiograph performed 06/27/2015 FINDINGS: The lungs are hyperexpanded, with flattening of the hemidiaphragms, compatible with COPD. Vascular congestion is noted. Increased interstitial markings raise concern for mild interstitial edema. There is no evidence of focal opacification, pleural effusion or pneumothorax. The heart is normal in size; the mediastinal contour is within normal limits. No acute osseous  abnormalities are seen. Cervical spinal fusion hardware is noted. IMPRESSION: 1. Vascular congestion. Increased interstitial markings raise concern for mild pulmonary edema. 2. Findings of COPD. Electronically Signed   By: Garald Balding M.D.   On: 07/17/2015 03:56      Lab Results  Component Value Date   HGBA1C 5.8* 07/17/2015   Lab Results  Component Value Date   CREATININE 0.87 07/18/2015       Scheduled Meds: . [MAR Hold] antiseptic oral rinse  7 mL Mouth Rinse BID  . [MAR Hold] diltiazem  120 mg Oral Daily  . [MAR Hold] escitalopram  10 mg Oral QPM  . [MAR Hold] ipratropium-albuterol  3 mL Nebulization TID  . [MAR Hold] levofloxacin (LEVAQUIN) IV  750 mg Intravenous Q24H  . [MAR Hold] meloxicam  7.5 mg Oral QHS  . [MAR Hold] methylPREDNISolone (SOLU-MEDROL) injection  80 mg Intravenous Q8H  . [MAR Hold] mometasone-formoterol  2 puff Inhalation BID  . [MAR Hold] rivaroxaban  20 mg Oral Q supper  . [MAR Hold] sodium chloride flush  3 mL Intravenous Q12H  . [MAR Hold] sodium chloride flush  3 mL Intravenous Q12H  . [MAR Hold] sodium chloride flush  3 mL Intravenous Q12H   Continuous Infusions: . sodium chloride    . lactated ringers       LOS: 1 day    Time spent: >30 MINS    [facility] Pager [phone]. If 7PM-7AM, please contact night-coverage at [email or web address], password [facility] 07/18/2015, 11:06 AM  LOS: 1 day

## 2015-07-18 NOTE — H&P (View-Only) (Signed)
Patient ID: Lindsay Salazar, female   DOB: [birth year], 65 y.o.   MRN: [id number]    Subjective:  Denies SSCP, palpitations or Dyspnea All questions about [facility] answered Still with palpitations on ambulation   Objective:  Filed Vitals:   07/17/15 2006 07/17/15 2115 07/18/15 0521 07/18/15 0750  BP:  146/75 117/79   Pulse:  89 99   Temp:  97.5 F (36.4 C) 97.6 F (36.4 C)   TempSrc:  Oral Oral   Resp:  20 20   Height:      Weight:   63.05 kg (139 lb) 63.05 kg (139 lb)  SpO2: 98% 98% 97%     Intake/Output from previous day:  Intake/Output Summary (Last 24 hours) at 07/18/15 0800 Last data filed at 07/18/15 0550  Gross per 24 hour  Intake    750 ml  Output   1175 ml  Net   -425 ml    Physical Exam: Affect appropriate Healthy:  appears stated age HEENT: normal Neck supple with no adenopathy JVP normal no bruits no thyromegaly Lungs clear with no wheezing and good diaphragmatic motion Heart:  S1/S2 no murmur, no rub, gallop or click PMI normal Abdomen: benighn, BS positve, no tenderness, no AAA no bruit.  No HSM or HJR Distal pulses intact with no bruits No edema Neuro non-focal Skin warm and dry No muscular weakness   Lab Results: Basic Metabolic Panel:  Recent Labs  07/17/15 0358 07/18/15 0443  NA 140 133*  K 4.1 3.9  CL 108 104  CO2 27 23  GLUCOSE 119* 156*  BUN 30* 36*  CREATININE 0.95 0.87  CALCIUM 8.2* 8.5*   Liver Function Tests:  Recent Labs  07/18/15 0443  AST 17  ALT 34  ALKPHOS 61  BILITOT 0.6  PROT 6.0*  ALBUMIN 3.3*   CBC:  Recent Labs  07/17/15 0358 07/18/15 0443  WBC 15.1* 16.2*  NEUTROABS 12.8*  --   HGB 11.0* 10.8*  HCT 33.4* 32.2*  MCV 97.4 96.4  PLT 170 204   Cardiac Enzymes:  Recent Labs  07/17/15 0903 07/17/15 1342 07/17/15 1943  TROPONINI 0.20* 0.15* 0.09*   Hemoglobin A1C:  Recent Labs  07/17/15 0903  HGBA1C 5.8*    Imaging: Dg Chest 2 View  07/17/2015  CLINICAL DATA:  Acute onset of shortness of  breath and congestion. Lower extremity swelling. Initial encounter. EXAM: CHEST  2 VIEW COMPARISON:  Chest radiograph performed 06/27/2015 FINDINGS: The lungs are hyperexpanded, with flattening of the hemidiaphragms, compatible with COPD. Vascular congestion is noted. Increased interstitial markings raise concern for mild interstitial edema. There is no evidence of focal opacification, pleural effusion or pneumothorax. The heart is normal in size; the mediastinal contour is within normal limits. No acute osseous abnormalities are seen. Cervical spinal fusion hardware is noted. IMPRESSION: 1. Vascular congestion. Increased interstitial markings raise concern for mild pulmonary edema. 2. Findings of COPD. Electronically Signed   By: Garald Balding M.D.   On: 07/17/2015 03:56    Cardiac Studies:  ECG: afib otherwise normal ECG    Telemetry: afib rates 90-110 07/18/2015   Echo: 06/28/15  Impressions:  - Normal LV wall thickness with LVEF approximately 55%. There is  akinesis of the mid anteroseptal wall. Indeterminate diastolic  function in the setting of atrial fibrillation/flutter. Mild  biatrial enlargement. Mildly thickened mitral leaflets with mild  mitral regurgitation. Trivial aortic regurgitation. Low normal RV  contraction. Moderate tricuspid regurgitation with PASP 41 mmHg.  Medications:   .  antiseptic oral rinse  7 mL Mouth Rinse BID  . diltiazem  120 mg Oral Daily  . escitalopram  10 mg Oral QPM  . ipratropium-albuterol  3 mL Nebulization TID  . levofloxacin (LEVAQUIN) IV  750 mg Intravenous Q48H  . meloxicam  7.5 mg Oral QHS  . methylPREDNISolone (SOLU-MEDROL) injection  80 mg Intravenous Q8H  . mometasone-formoterol  2 puff Inhalation BID  . rivaroxaban  15 mg Oral Daily  . sodium chloride flush  3 mL Intravenous Q12H  . sodium chloride flush  3 mL Intravenous Q12H  . sodium chloride flush  3 mL Intravenous Q12H     . sodium chloride      Assessment/Plan:  Afib:   Been on xarelto over 3 weeks no missed doses. Not clear why 15 mg dose chosen. Risks DCC discussed including intubation and stroke willing to proceed NPO  For [facility] latter today Current rate control with cardizem  EF normal only mild LAE on echo hopefully will be successful with no AAT Pharmacy note 5/26 recommended 15 mg dose GFR then 43 but GFR over 60 now and no CRF  COPD:  Stable no active wheezing continue nebulizer Rx ? Cut back on steroid pulse Counseled on smoking cessation    Jenkins Rouge 07/18/2015, 8:00 AM

## 2015-07-18 NOTE — Interval H&P Note (Signed)
History and Physical Interval Note:  07/18/2015 12:59 PM  Lindsay Salazar  has presented today for surgery, with the diagnosis of a-fib  The various methods of treatment have been discussed with the patient and family. After consideration of risks, benefits and other options for treatment, the patient has consented to  Procedure(s): CARDIOVERSION (N/A) as a surgical intervention .  The patient's history has been reviewed, patient examined, no change in status, stable for surgery.  I have reviewed the patient's chart and labs.  Questions were answered to the patient's satisfaction.     Jenkins Rouge

## 2015-07-18 NOTE — CV Procedure (Signed)
[place]: Propofol 50 mg Sedation Time direct observation 15 minutes  DCC x 2 150 J then 200J Afib rate 110 converted to NSR rate 76 with PAC;s  Iv lopressor 5 mg and Amiodarone 150 mg given On Rx xarelto No immediate neurologic sequelae  Jenkins Rouge

## 2015-07-18 NOTE — Care Management Important Message (Signed)
Important Message  Patient Details  Name: STARLIGHT HARRISON MRN: [id number] Date of Birth: [birth year]   Medicare Important Message Given:  Yes    Sherald Barge, RN 07/18/2015, 4:00 PM

## 2015-07-18 NOTE — Progress Notes (Addendum)
Patient ID: Lindsay Salazar, female   DOB: 10-Feb-1950, 65 y.o.   MRN: [id number]    Subjective:  Denies SSCP, palpitations or Dyspnea All questions about [facility] answered Still with palpitations on ambulation   Objective:  Filed Vitals:   07/17/15 2006 07/17/15 2115 07/18/15 0521 07/18/15 0750  BP:  146/75 117/79   Pulse:  89 99   Temp:  97.5 F (36.4 C) 97.6 F (36.4 C)   TempSrc:  Oral Oral   Resp:  20 20   Height:      Weight:   63.05 kg (139 lb) 63.05 kg (139 lb)  SpO2: 98% 98% 97%     Intake/Output from previous day:  Intake/Output Summary (Last 24 hours) at 07/18/15 0800 Last data filed at 07/18/15 0550  Gross per 24 hour  Intake    750 ml  Output   1175 ml  Net   -425 ml    Physical Exam: Affect appropriate Healthy:  appears stated age HEENT: normal Neck supple with no adenopathy JVP normal no bruits no thyromegaly Lungs clear with no wheezing and good diaphragmatic motion Heart:  S1/S2 no murmur, no rub, gallop or click PMI normal Abdomen: benighn, BS positve, no tenderness, no AAA no bruit.  No HSM or HJR Distal pulses intact with no bruits No edema Neuro non-focal Skin warm and dry No muscular weakness   Lab Results: Basic Metabolic Panel:  Recent Labs  07/17/15 0358 07/18/15 0443  NA 140 133*  K 4.1 3.9  CL 108 104  CO2 27 23  GLUCOSE 119* 156*  BUN 30* 36*  CREATININE 0.95 0.87  CALCIUM 8.2* 8.5*   Liver Function Tests:  Recent Labs  07/18/15 0443  AST 17  ALT 34  ALKPHOS 61  BILITOT 0.6  PROT 6.0*  ALBUMIN 3.3*   CBC:  Recent Labs  07/17/15 0358 07/18/15 0443  WBC 15.1* 16.2*  NEUTROABS 12.8*  --   HGB 11.0* 10.8*  HCT 33.4* 32.2*  MCV 97.4 96.4  PLT 170 204   Cardiac Enzymes:  Recent Labs  07/17/15 0903 07/17/15 1342 07/17/15 1943  TROPONINI 0.20* 0.15* 0.09*   Hemoglobin A1C:  Recent Labs  07/17/15 0903  HGBA1C 5.8*    Imaging: Dg Chest 2 View  07/17/2015  CLINICAL DATA:  Acute onset of shortness of  breath and congestion. Lower extremity swelling. Initial encounter. EXAM: CHEST  2 VIEW COMPARISON:  Chest radiograph performed 06/27/2015 FINDINGS: The lungs are hyperexpanded, with flattening of the hemidiaphragms, compatible with COPD. Vascular congestion is noted. Increased interstitial markings raise concern for mild interstitial edema. There is no evidence of focal opacification, pleural effusion or pneumothorax. The heart is normal in size; the mediastinal contour is within normal limits. No acute osseous abnormalities are seen. Cervical spinal fusion hardware is noted. IMPRESSION: 1. Vascular congestion. Increased interstitial markings raise concern for mild pulmonary edema. 2. Findings of COPD. Electronically Signed   By: Garald Balding M.D.   On: 07/17/2015 03:56    Cardiac Studies:  ECG: afib otherwise normal ECG    Telemetry: afib rates 90-110 07/18/2015   Echo: 06/28/15  Impressions:  - Normal LV wall thickness with LVEF approximately 55%. There is  akinesis of the mid anteroseptal wall. Indeterminate diastolic  function in the setting of atrial fibrillation/flutter. Mild  biatrial enlargement. Mildly thickened mitral leaflets with mild  mitral regurgitation. Trivial aortic regurgitation. Low normal RV  contraction. Moderate tricuspid regurgitation with PASP 41 mmHg.  Medications:   .  antiseptic oral rinse  7 mL Mouth Rinse BID  . diltiazem  120 mg Oral Daily  . escitalopram  10 mg Oral QPM  . ipratropium-albuterol  3 mL Nebulization TID  . levofloxacin (LEVAQUIN) IV  750 mg Intravenous Q48H  . meloxicam  7.5 mg Oral QHS  . methylPREDNISolone (SOLU-MEDROL) injection  80 mg Intravenous Q8H  . mometasone-formoterol  2 puff Inhalation BID  . rivaroxaban  15 mg Oral Daily  . sodium chloride flush  3 mL Intravenous Q12H  . sodium chloride flush  3 mL Intravenous Q12H  . sodium chloride flush  3 mL Intravenous Q12H     . sodium chloride      Assessment/Plan:  Afib:   Been on xarelto over 3 weeks no missed doses. Not clear why 15 mg dose chosen. Risks DCC discussed including intubation and stroke willing to proceed NPO  For [facility] latter today Current rate control with cardizem  EF normal only mild LAE on echo hopefully will be successful with no AAT Pharmacy note 5/26 recommended 15 mg dose GFR then 43 but GFR over 60 now and no CRF  COPD:  Stable no active wheezing continue nebulizer Rx ? Cut back on steroid pulse Counseled on smoking cessation    Jenkins Rouge 07/18/2015, 8:00 AM

## 2015-07-18 NOTE — Anesthesia Postprocedure Evaluation (Signed)
Anesthesia Post Note  Patient: Lindsay Salazar  Procedure(s) Performed: Procedure(s) (LRB): CARDIOVERSION (N/A)  Patient location during evaluation: PACU Anesthesia Type: MAC Level of consciousness: awake and alert and oriented Pain management: pain level controlled Vital Signs Assessment: post-procedure vital signs reviewed and stable Respiratory status: spontaneous breathing Cardiovascular status: blood pressure returned to baseline Postop Assessment: no signs of nausea or vomiting Anesthetic complications: no    Last Vitals:  Filed Vitals:   07/18/15 1223 07/18/15 1224  BP:  107/68  Pulse:    Temp:    Resp: 24 23    Last Pain:  Filed Vitals:   07/18/15 1254  PainSc: 0-No pain                 Eryca Bolte

## 2015-07-18 NOTE — Transfer of Care (Signed)
Immediate Anesthesia Transfer of Care Note  Patient: Lindsay Salazar  Procedure(s) Performed: Procedure(s): CARDIOVERSION (N/A)  Patient Location: PACU  Anesthesia Type:MAC  Level of Consciousness: awake, alert  and oriented  Airway & Oxygen Therapy: Patient Spontanous Breathing and Patient connected to face mask oxygen  Post-op Assessment: Report given to RN  Post vital signs: Reviewed and stable  Last Vitals:  Filed Vitals:   07/18/15 1223 07/18/15 1224  BP:  107/68  Pulse:    Temp:    Resp: 24 23    Last Pain:  Filed Vitals:   07/18/15 1254  PainSc: 0-No pain      Patients Stated Pain Goal: 5 ([phone] [id number])  Complications: No apparent anesthesia complications

## 2015-07-18 NOTE — Progress Notes (Signed)
Electrical Cardioversion Procedure Note Lindsay Salazar [id number] Jun 30, 1950  Procedure: Electrical Cardioversion Indications:  Atrial Fibrillation  Procedure Details Consent: Risks of procedure as well as the alternatives and risks of each were explained to the (patient/caregiver).  Consent for procedure obtained. Time Out: Verified patient identification, verified procedure, site/side was marked, verified correct patient position, special equipment/implants available, medications/allergies/relevent history reviewed, required imaging and test results available.  Performed  Patient placed on cardiac monitor, pulse oximetry, supplemental oxygen as necessary.  Sedation given: Short-acting barbiturates Pacer pads placed anterior and posterior chest.  Cardioverted 2 time(s).  Cardioverted at [place].  Evaluation Findings: Post procedure EKG shows: NSR Complications: None Patient did tolerate procedure well.   Charm Barges S 07/18/2015, 1:13 PM

## 2015-07-18 NOTE — Anesthesia Preprocedure Evaluation (Signed)
Anesthesia Evaluation  Patient identified by MRN, date of birth, ID band Patient awake    Reviewed: Allergy & Precautions, NPO status , Patient's Chart, lab work & pertinent test results  Airway Mallampati: II  TM Distance: >3 FB     Dental  (+) Poor Dentition, Missing, Chipped, Loose,    Pulmonary asthma , COPD, Current Smoker,    breath sounds clear to auscultation       Cardiovascular hypertension, Pt. on medications + dysrhythmias Atrial Fibrillation  Rhythm:Irregular Rate:Normal     Neuro/Psych    GI/Hepatic GERD  Controlled,  Endo/Other    Renal/GU      Musculoskeletal  (+) Fibromyalgia -  Abdominal   Peds  Hematology   Anesthesia Other Findings   Reproductive/Obstetrics                             Anesthesia Physical Anesthesia Plan  ASA: III  Anesthesia Plan: MAC   Post-op Pain Management:    Induction: Intravenous  Airway Management Planned: Simple Face Mask  Additional Equipment:   Intra-op Plan:   Post-operative Plan:   Informed Consent: I have reviewed the patients History and Physical, chart, labs and discussed the procedure including the risks, benefits and alternatives for the proposed anesthesia with the patient or authorized representative who has indicated his/her understanding and acceptance.     Plan Discussed with:   Anesthesia Plan Comments:         Anesthesia Quick Evaluation

## 2015-07-19 ENCOUNTER — Telehealth: Payer: Self-pay | Admitting: Orthopaedic Surgery

## 2015-07-19 ENCOUNTER — Encounter (HOSPITAL_COMMUNITY): Payer: Self-pay | Admitting: Cardiovascular Disease

## 2015-07-19 LAB — COMPREHENSIVE METABOLIC PANEL
ALK PHOS: 56 U/L (ref 38–126)
ALT: 34 U/L (ref 14–54)
ANION GAP: 7 (ref 5–15)
AST: 21 U/L (ref 15–41)
Albumin: 3 g/dL — ABNORMAL LOW (ref 3.5–5.0)
BILIRUBIN TOTAL: 0.8 mg/dL (ref 0.3–1.2)
BUN: 32 mg/dL — ABNORMAL HIGH (ref 6–20)
CALCIUM: 8.4 mg/dL — AB (ref 8.9–10.3)
CO2: 23 mmol/L (ref 22–32)
CREATININE: 0.9 mg/dL (ref 0.44–1.00)
Chloride: 103 mmol/L (ref 101–111)
Glucose, Bld: 158 mg/dL — ABNORMAL HIGH (ref 65–99)
Potassium: 4.3 mmol/L (ref 3.5–5.1)
Sodium: 133 mmol/L — ABNORMAL LOW (ref 135–145)
TOTAL PROTEIN: 5.5 g/dL — AB (ref 6.5–8.1)

## 2015-07-19 LAB — CBC
HEMATOCRIT: 29.4 % — AB (ref 36.0–46.0)
Hemoglobin: 9.8 g/dL — ABNORMAL LOW (ref 12.0–15.0)
MCH: 32 pg (ref 26.0–34.0)
MCHC: 33.3 g/dL (ref 30.0–36.0)
MCV: 96.1 fL (ref 78.0–100.0)
Platelets: 219 10*3/uL (ref 150–400)
RBC: 3.06 MIL/uL — ABNORMAL LOW (ref 3.87–5.11)
RDW: 16.9 % — AB (ref 11.5–15.5)
WBC: 16 10*3/uL — ABNORMAL HIGH (ref 4.0–10.5)

## 2015-07-19 MED ORDER — LEVOFLOXACIN 750 MG PO TABS: 750.0000 mg | ORAL_TABLET | Freq: Every day | ORAL | Status: DC

## 2015-07-19 MED ORDER — PREDNISONE 10 MG PO TABS: ORAL_TABLET | ORAL | Status: DC

## 2015-07-19 MED ORDER — PANTOPRAZOLE SODIUM 40 MG PO TBEC: 40.0000 mg | DELAYED_RELEASE_TABLET | Freq: Every day | ORAL | Status: DC

## 2015-07-19 MED ORDER — ALBUTEROL SULFATE (2.5 MG/3ML) 0.083% IN NEBU: 2.5000 mg | INHALATION_SOLUTION | Freq: Four times a day (QID) | RESPIRATORY_TRACT | Status: DC | PRN

## 2015-07-19 MED ORDER — RIVAROXABAN 20 MG PO TABS: 20.0000 mg | ORAL_TABLET | Freq: Every day | ORAL | Status: DC

## 2015-07-19 MED ORDER — IPRATROPIUM-ALBUTEROL 18-103 MCG/ACT IN AERO: 2.0000 | INHALATION_SPRAY | Freq: Two times a day (BID) | RESPIRATORY_TRACT | Status: DC

## 2015-07-19 NOTE — Care Management Note (Signed)
Case Management Note  Patient Details  Name: Lindsay Salazar MRN: [id number] Date of Birth: April 21, 1950  Subjective/Objective:                  Pt admitted with a-fib. Pt is from home, lives with family and is ind with ADL's. Pt has met requirements for home O2 and will need neb machine. HH RN/aid ordered. Pt has chosen [facility] from list of [place] and DME providers. Romualdo Bolk, of [facility], made aware of referral and will obtain pt info from chart. Pt understands HH has 48 hours to make first visit. Pt will have neb and port O2 tanks delivered to room prior to discharge. Pt has PCP for F/U, transportation and can afford her medications.   Action/Plan: Discharging home today with HH, oxygen and neb.   Expected Discharge Date:       07/19/2015           Expected Discharge Plan:  [place]  In-House Referral:  NA  Discharge planning Services  CM Consult  Post Acute Care Choice:  Home Health, Durable Medical Equipment Choice offered to:  Patient  DME Arranged:  Oxygen, Nebulizer machine DME Agency:  [place]:  RN, Nurse's Aide [place] Agency:  [place]  Status of Service:  Completed, signed off  Medicare Important Message Given:  Yes Date Medicare IM Given:    Medicare IM give by:    Date Additional Medicare IM Given:    Additional Medicare Important Message give by:     If discussed at [place] of Stay Meetings, dates discussed:    Additional Comments:  Sherald Barge, RN 07/19/2015, 1:50 PM

## 2015-07-19 NOTE — Telephone Encounter (Signed)
Denied.  He just got some from another physician.

## 2015-07-19 NOTE — Telephone Encounter (Signed)
Called back to patient to relay - no answer and no voice mail at ph# [phone].

## 2015-07-19 NOTE — Plan of Care (Signed)
Problem: Pain Managment: Goal: General experience of comfort will improve Outcome: Progressing Medicated with prn pain medication effective

## 2015-07-19 NOTE — Discharge Summary (Addendum)
Physician Discharge Summary  [place] MRN: [id number] DOB/AGE: October 09, 1950 65 y.o.  PCP: [name], PA-C   Admit date: 07/17/2015 Discharge date: 07/19/2015  Discharge Diagnoses:     Principal Problem:   Atrial fibrillation with RVR ([place]) Active Problems:   Elevated troponin I level   Hyperglycemia   Anemia   Atrial fibrillation (HCC)   Dyspnea    Follow-up recommendations Follow-up with PCP in 3-5 days , including all  additional recommended appointments as below Follow-up CBC, CMP in 3-5 days Patient to follow-up with Dr. branch cardiology     Current Discharge Medication List    START taking these medications   Details  levofloxacin (LEVAQUIN) 750 MG tablet Take 1 tablet (750 mg total) by mouth daily. Qty: 5 tablet, Refills: 0    pantoprazole (PROTONIX) 40 MG tablet Take 1 tablet (40 mg total) by mouth daily. Qty: 30 tablet, Refills: 0      CONTINUE these medications which have CHANGED   Details  albuterol (PROVENTIL) (2.5 MG/3ML) 0.083% nebulizer solution Take 3 mLs (2.5 mg total) by nebulization every 6 (six) hours as needed for wheezing or shortness of breath. Qty: 75 mL, Refills: 12    albuterol-ipratropium (COMBIVENT) 18-103 MCG/ACT inhaler Inhale 2 puffs into the lungs 2 (two) times daily. For shortness of breath Qty: 1 Inhaler, Refills: 2    predniSONE (DELTASONE) 10 MG tablet 6 tablets 5 days, 5 tablets 5 days, 4 tablets 5 days, 3 tablets 5 days, 2 tablets 5 days, 1 tablet 5 days, half a tablet 5 days then discontinue Qty: 130 tablet, Refills: 0    rivaroxaban (XARELTO) 20 MG TABS tablet Take 1 tablet (20 mg total) by mouth daily with supper. Qty: 30 tablet, Refills: 10      CONTINUE these medications which have NOT CHANGED   Details  albuterol (VENTOLIN HFA) 108 (90 BASE) MCG/ACT inhaler Inhale 2 puffs into the lungs 2 (two) times daily. For shortness of breath    diltiazem (CARDIZEM CD) 120 MG 24 hr capsule Take 1 capsule (120 mg  total) by mouth 2 (two) times daily. Qty: 60 capsule, Refills: 6    escitalopram (LEXAPRO) 10 MG tablet Take 10 mg by mouth every evening.     furosemide (LASIX) 20 MG tablet Take 1 tablet (20 mg total) by mouth daily as needed for edema. Qty: 90 tablet, Refills: 3    HYDROcodone-acetaminophen (NORCO/VICODIN) 5-325 MG tablet Take 1 tablet by mouth every 4 (four) hours as needed for moderate pain (Must last 30 days.  Do not take and drive a car or use machinery.). Qty: 120 tablet, Refills: 0    meloxicam (MOBIC) 7.5 MG tablet Take 7.5 mg by mouth at bedtime.    SYMBICORT 160-4.5 MCG/ACT inhaler Inhale 1 puff into the lungs 2 (two) times daily.           Discharge Condition: Stable   Discharge Instructions Get Medicines reviewed and adjusted: Please take all your medications with you for your next visit with your Primary MD  Please request your Primary MD to go over all hospital tests and procedure/radiological results at the follow up, please ask your Primary MD to get all Hospital records sent to his/her office.  If you experience worsening of your admission symptoms, develop shortness of breath, life threatening emergency, suicidal or homicidal thoughts you must seek medical attention immediately by calling 911 or calling your MD immediately if symptoms less severe.  You must read complete instructions/literature along with  all the possible adverse reactions/side effects for all the Medicines you take and that have been prescribed to you. Take any new Medicines after you have completely understood and accpet all the possible adverse reactions/side effects.   Do not drive when taking Pain medications.   Do not take more than prescribed Pain, Sleep and Anxiety Medications  Special Instructions: If you have smoked or chewed Tobacco in the last 2 yrs please stop smoking, stop any regular Alcohol and or any Recreational drug use.  Wear Seat belts while driving.  Please  note  You were cared for by a hospitalist during your hospital stay. Once you are discharged, your primary care physician will handle any further medical issues. Please note that NO REFILLS for any discharge medications will be authorized once you are discharged, as it is imperative that you return to your primary care physician (or establish a relationship with a primary care physician if you do not have one) for your aftercare needs so that they can reassess your need for medications and monitor your lab values.  Discharge Instructions    Diet - low sodium heart healthy    Complete by:  As directed      Increase activity slowly    Complete by:  As directed             Allergies  Allergen Reactions  . Ibuprofen Nausea And Vomiting  . Penicillins Swelling    Has patient had a PCN reaction causing immediate rash, facial/tongue/throat swelling, SOB or lightheadedness with hypotension: Yes Has patient had a PCN reaction causing severe rash involving mucus membranes or skin necrosis: No Has patient had a PCN reaction that required hospitalization No Has patient had a PCN reaction occurring within the last 10 years: No If all of the above answers are "NO", then may proceed with Cephalosporin use.   . Tiotropium Bromide Monohydrate Itching  . Tramadol Nausea And Vomiting  . Vicodin [Hydrocodone-Acetaminophen] Nausea And Vomiting      Disposition: 01-Home or Self Care   Consults:  Cardiology    Significant Diagnostic Studies:  Dg Chest 2 View  07/17/2015  CLINICAL DATA:  Acute onset of shortness of breath and congestion. Lower extremity swelling. Initial encounter. EXAM: CHEST  2 VIEW COMPARISON:  Chest radiograph performed 06/27/2015 FINDINGS: The lungs are hyperexpanded, with flattening of the hemidiaphragms, compatible with COPD. Vascular congestion is noted. Increased interstitial markings raise concern for mild interstitial edema. There is no evidence of focal opacification,  pleural effusion or pneumothorax. The heart is normal in size; the mediastinal contour is within normal limits. No acute osseous abnormalities are seen. Cervical spinal fusion hardware is noted. IMPRESSION: 1. Vascular congestion. Increased interstitial markings raise concern for mild pulmonary edema. 2. Findings of COPD. Electronically Signed   By: Garald Balding M.D.   On: 07/17/2015 03:56   Dg Chest Portable 1 View  06/27/2015  CLINICAL DATA:  65 year old female with increasing shortness of breath for several days. Initial encounter. Smoker. COPD. EXAM: PORTABLE CHEST 1 VIEW COMPARISON:  09/25/2007. FINDINGS: Upright AP view of the chest at 1352 hours. New confluent right lung base opacity. Underlying chronic hyperinflation. Stable mediastinal contours. No pneumothorax or pulmonary edema. No definite pleural effusion. Chronic ACDF hardware in the cervical spine. No acute osseous abnormality identified. IMPRESSION: Patchy right lung base opacity compatible with acute infectious exacerbation superimposed on chronic lung disease with hyperinflation. No definite pleural effusion. Follow-up PA and lateral views of the chest would be helpful when  possible. Electronically Signed   By: Genevie Ann M.D.   On: 06/27/2015 14:05    2-D echo    Filed Weights   07/18/15 0750 07/18/15 0957 07/19/15 0427  Weight: 63.05 kg (139 lb) 63.05 kg (139 lb) 64.864 kg (143 lb)     Microbiology: No results found for this or any previous visit (from the past 240 hour(s)).     Blood Culture    Component Value Date/Time   SDES BLOOD RIGHT HAND 06/27/2015 1555   SPECREQUEST BOTTLES DRAWN AEROBIC AND ANAEROBIC 6CC EACH 06/27/2015 1555   CULT NO GROWTH 5 DAYS 06/27/2015 1555   REPTSTATUS 07/02/2015 FINAL 06/27/2015 1555      Labs: Results for orders placed or performed during the hospital encounter of 07/17/15 (from the past 48 hour(s))  Troponin I (q 6hr x 3)     Status: Abnormal   Collection Time: 07/17/15  1:42  PM  Result Value Ref Range   Troponin I 0.15 (H) <0.031 ng/mL    Comment:        PERSISTENTLY INCREASED TROPONIN VALUES IN THE RANGE OF 0.04-0.49 ng/mL CAN BE SEEN IN:       -UNSTABLE ANGINA       -CONGESTIVE HEART FAILURE       -MYOCARDITIS       -CHEST TRAUMA       -ARRYHTHMIAS       -LATE PRESENTING MYOCARDIAL INFARCTION       -COPD   CLINICAL FOLLOW-UP RECOMMENDED.   Troponin I (q 6hr x 3)     Status: Abnormal   Collection Time: 07/17/15  7:43 PM  Result Value Ref Range   Troponin I 0.09 (H) <0.031 ng/mL    Comment:        PERSISTENTLY INCREASED TROPONIN VALUES IN THE RANGE OF 0.04-0.49 ng/mL CAN BE SEEN IN:       -UNSTABLE ANGINA       -CONGESTIVE HEART FAILURE       -MYOCARDITIS       -CHEST TRAUMA       -ARRYHTHMIAS       -LATE PRESENTING MYOCARDIAL INFARCTION       -COPD   CLINICAL FOLLOW-UP RECOMMENDED.   CBC     Status: Abnormal   Collection Time: 07/18/15  4:43 AM  Result Value Ref Range   WBC 16.2 (H) 4.0 - 10.5 K/uL   RBC 3.34 (L) 3.87 - 5.11 MIL/uL   Hemoglobin 10.8 (L) 12.0 - 15.0 g/dL   HCT 32.2 (L) 36.0 - 46.0 %   MCV 96.4 78.0 - 100.0 fL   MCH 32.3 26.0 - 34.0 pg   MCHC 33.5 30.0 - 36.0 g/dL   RDW 16.7 (H) 11.5 - 15.5 %   Platelets 204 150 - 400 K/uL  Comprehensive metabolic panel     Status: Abnormal   Collection Time: 07/18/15  4:43 AM  Result Value Ref Range   Sodium 133 (L) 135 - 145 mmol/L    Comment: DELTA CHECK NOTED   Potassium 3.9 3.5 - 5.1 mmol/L   Chloride 104 101 - 111 mmol/L   CO2 23 22 - 32 mmol/L   Glucose, Bld 156 (H) 65 - 99 mg/dL   BUN 36 (H) 6 - 20 mg/dL   Creatinine, Ser 0.87 0.44 - 1.00 mg/dL   Calcium 8.5 (L) 8.9 - 10.3 mg/dL   Total Protein 6.0 (L) 6.5 - 8.1 g/dL   Albumin 3.3 (L) 3.5 - 5.0 g/dL   AST  17 15 - 41 U/L   ALT 34 14 - 54 U/L   Alkaline Phosphatase 61 38 - 126 U/L   Total Bilirubin 0.6 0.3 - 1.2 mg/dL   GFR calc non Af Amer >60 >60 mL/min   GFR calc Af Amer >60 >60 mL/min    Comment: (NOTE) The eGFR  has been calculated using the CKD EPI equation. This calculation has not been validated in all clinical situations. eGFR's persistently <60 mL/min signify possible Chronic Kidney Disease.    Anion gap 6 5 - 15  CBC     Status: Abnormal   Collection Time: 07/19/15  5:14 AM  Result Value Ref Range   WBC 16.0 (H) 4.0 - 10.5 K/uL   RBC 3.06 (L) 3.87 - 5.11 MIL/uL   Hemoglobin 9.8 (L) 12.0 - 15.0 g/dL   HCT 29.4 (L) 36.0 - 46.0 %   MCV 96.1 78.0 - 100.0 fL   MCH 32.0 26.0 - 34.0 pg   MCHC 33.3 30.0 - 36.0 g/dL   RDW 16.9 (H) 11.5 - 15.5 %   Platelets 219 150 - 400 K/uL  Comprehensive metabolic panel     Status: Abnormal   Collection Time: 07/19/15  5:14 AM  Result Value Ref Range   Sodium 133 (L) 135 - 145 mmol/L   Potassium 4.3 3.5 - 5.1 mmol/L   Chloride 103 101 - 111 mmol/L   CO2 23 22 - 32 mmol/L   Glucose, Bld 158 (H) 65 - 99 mg/dL   BUN 32 (H) 6 - 20 mg/dL   Creatinine, Ser 0.90 0.44 - 1.00 mg/dL   Calcium 8.4 (L) 8.9 - 10.3 mg/dL   Total Protein 5.5 (L) 6.5 - 8.1 g/dL   Albumin 3.0 (L) 3.5 - 5.0 g/dL   AST 21 15 - 41 U/L   ALT 34 14 - 54 U/L   Alkaline Phosphatase 56 38 - 126 U/L   Total Bilirubin 0.8 0.3 - 1.2 mg/dL   GFR calc non Af Amer >60 >60 mL/min   GFR calc Af Amer >60 >60 mL/min    Comment: (NOTE) The eGFR has been calculated using the CKD EPI equation. This calculation has not been validated in all clinical situations. eGFR's persistently <60 mL/min signify possible Chronic Kidney Disease.    Anion gap 7 5 - 15     Lipid Panel  No results found for: CHOL, TRIG, HDL, CHOLHDL, VLDL, LDLCALC, LDLDIRECT   Lab Results  Component Value Date   HGBA1C 5.8* 07/17/2015     Lab Results  Component Value Date   CREATININE 0.90 07/19/2015     65 y.o.female with known history of atrial fib/flutter, CHADS VASC Score of 3, on Xarelto, hypertension, COPD, and fibromyalgia., Presents with acute on chronic COPD exacerbation as well as atrial fibrillation with  rapid ventricular response. Patient currently receiving IV steroids, nebulizer treatments, cardiology has been consulted and plan is to [place] cardioversion 6/14   Hospital course 1. Afib with RVR -anticoagulated with xarelto, received 1 dose of IV Cardizem on 6/13 Post [facility] 6/14 , Xarelto dose changed to 20 mg , maintaining NSR continue cardizem ok to d/c home from cardiology perspective  f/u Dr Harl Bowie  Trop i q6h x3 were slightly abnormal 0.2>0.15     2. Acute COPD exacerbation Solumedrol 85m iv q8h, start tapering, now being discharged on prednisone taper Albuterol 1 neb q6h and q6h prn refilled atrovent 1 neb q6h Continue levofloxacin 750 mg 5 more days  3. Anemia  Slight drop in hemoglobin since yesterday 11>10.8>9.8, without any active bleeding Will start patient on a PPI given high-dose steroids  4. Hyperglycemia likely steroid-induced Hemoglobin A1c 5.8  5. Tobacco dep Pt counselled on smoking cessation x 3 minutes.   Discharge Exam:   Blood pressure 111/81, pulse 106, temperature 97.7 F (36.5 C), temperature source Oral, resp. rate 20, height '5\' 4"'  (1.626 m), weight 64.864 kg (143 lb), SpO2 99 %. General exam: Appears calm and comfortable  Respiratory system: Clear to auscultation. Respiratory effort normal. Cardiovascular system: S1 & S2 heard, RRR. No JVD, murmurs, rubs, gallops or clicks. No pedal edema. Gastrointestinal system: Abdomen is nondistended, soft and nontender. No organomegaly or masses felt. Normal bowel sounds heard. Central nervous system: Alert and oriented. No focal neurological deficits. Extremities: Symmetric 5 x 5 power. Skin: No rashes, lesions or ulcers Psychiatry: Judgement and insight appear normal. Mood & affect appropriate.      Follow-up Information    Follow up with Jory Sims, NP On 08/02/2015.   Specialties:  Nurse Practitioner, Radiology, Cardiology   Why:  1:50   Contact information:   [place]  [id number] [phone]       Follow up with BAUCOM, JENNY B, PA-C. Schedule an appointment as soon as possible for a visit in 3 days.   Specialty:  Physician Assistant   Why:  Hospital follow-up   Contact information:   [address] [place] [address] [phone]       Signed: Reyne Dumas 07/19/2015, 11:08 AM        Time spent >45 mins

## 2015-07-19 NOTE — Progress Notes (Signed)
SATURATION QUALIFICATIONS: (This note is used to comply with regulatory documentation for home oxygen)  Patient Saturations on Room Air at Rest = 94%  Patient Saturations on Room Air while Ambulating = 88%  Patient Saturations on 2 Liters of oxygen while Ambulating = 96%  Please briefly explain why patient needs home oxygen: 

## 2015-07-19 NOTE — Progress Notes (Signed)
Patient ID: [name], female   DOB: [birth year], 65 y.o.   MRN: [id number]    Subjective:  Denies SSCP, palpitations or Dyspnea Tremulous after breathing Rx  Objective:  Filed Vitals:   07/18/15 2126 07/19/15 0427 07/19/15 0456 07/19/15 0814  BP: 135/77 111/81    Pulse: 106 106    Temp: 97.3 F (36.3 C) 97.7 F (36.5 C)    TempSrc: Oral Oral    Resp: 20 20    Height:      Weight:  143 lb (64.864 kg)    SpO2: 100% 98% 98% 99%    Intake/Output from previous day:  Intake/Output Summary (Last 24 hours) at 07/19/15 0912 Last data filed at 07/19/15 0848  Gross per 24 hour  Intake   2490 ml  Output   1975 ml  Net    515 ml    Physical Exam: Affect appropriate Healthy:  appears stated age HEENT: normal Neck supple with no adenopathy JVP normal no bruits no thyromegaly Lungs clear with no wheezing and good diaphragmatic motion Heart:  S1/S2 no murmur, no rub, gallop or click PMI normal Abdomen: benighn, BS positve, no tenderness, no AAA no bruit.  No HSM or HJR Distal pulses intact with no bruits No edema Neuro non-focal Skin warm and dry No muscular weakness   Lab Results: Basic Metabolic Panel:  Recent Labs  07/18/15 0443 07/19/15 0514  NA 133* 133*  K 3.9 4.3  CL 104 103  CO2 23 23  GLUCOSE 156* 158*  BUN 36* 32*  CREATININE 0.87 0.90  CALCIUM 8.5* 8.4*   Liver Function Tests:  Recent Labs  07/18/15 0443 07/19/15 0514  AST 17 21  ALT 34 34  ALKPHOS 61 56  BILITOT 0.6 0.8  PROT 6.0* 5.5*  ALBUMIN 3.3* 3.0*   CBC:  Recent Labs  07/17/15 0358 07/18/15 0443 07/19/15 0514  WBC 15.1* 16.2* 16.0*  NEUTROABS 12.8*  --   --   HGB 11.0* 10.8* 9.8*  HCT 33.4* 32.2* 29.4*  MCV 97.4 96.4 96.1  PLT 170 204 219   Cardiac Enzymes:  Recent Labs  07/17/15 0903 07/17/15 1342 07/17/15 1943  TROPONINI 0.20* 0.15* 0.09*   Hemoglobin A1C:  Recent Labs  07/17/15 0903  HGBA1C 5.8*    Imaging: No results found.  Cardiac Studies:  ECG:  afib otherwise normal ECG    Telemetry: NSR PAC;s 07/19/2015   Echo: 06/28/15  Impressions:  - Normal LV wall thickness with LVEF approximately 55%. There is  akinesis of the mid anteroseptal wall. Indeterminate diastolic  function in the setting of atrial fibrillation/flutter. Mild  biatrial enlargement. Mildly thickened mitral leaflets with mild  mitral regurgitation. Trivial aortic regurgitation. Low normal RV  contraction. Moderate tricuspid regurgitation with PASP 41 mmHg.  Medications:   . antiseptic oral rinse  7 mL Mouth Rinse BID  . diltiazem  120 mg Oral Daily  . escitalopram  10 mg Oral QPM  . fluticasone furoate-vilanterol  1 puff Inhalation Daily  . ipratropium-albuterol  3 mL Nebulization TID  . levofloxacin (LEVAQUIN) IV  750 mg Intravenous Q24H  . meloxicam  7.5 mg Oral QHS  . methylPREDNISolone (SOLU-MEDROL) injection  40 mg Intravenous Q12H  . rivaroxaban  20 mg Oral Q supper  . sodium chloride flush  3 mL Intravenous Q12H  . sodium chloride flush  3 mL Intravenous Q12H  . sodium chloride flush  3 mL Intravenous Q12H     . sodium chloride  Assessment/Plan:  Afib:  Post DCC Xarelto dose changed to 20 mg as GRR normal Maint NSR continue cardizem ok to d/c home from our perspective f/u Dr Harl Bowie  COPD:  Stable no active wheezing continue nebulizer Rx ? Cut back on steroid pulse Counseled on smoking cessation    Jenkins Rouge 07/19/2015, 9:12 AM

## 2015-07-19 NOTE — Telephone Encounter (Signed)
Patient called to request refill on: (quantity 120) HYDROcodone-acetaminophen (NORCO/VICODIN) 5-325 MG tablet [id number]

## 2015-07-25 ENCOUNTER — Ambulatory Visit (INDEPENDENT_AMBULATORY_CARE_PROVIDER_SITE_OTHER): Payer: Medicare HMO | Admitting: Orthopaedic Surgery

## 2015-07-25 ENCOUNTER — Encounter: Payer: Self-pay | Admitting: Orthopaedic Surgery

## 2015-07-25 ENCOUNTER — Ambulatory Visit (INDEPENDENT_AMBULATORY_CARE_PROVIDER_SITE_OTHER): Payer: Medicare HMO

## 2015-07-25 VITALS — BP 124/61 | HR 84 | Temp 97.3°F | Ht 64.0 in | Wt 130.2 lb

## 2015-07-25 DIAGNOSIS — M25551 Pain in right hip: Secondary | ICD-10-CM

## 2015-07-25 MED ORDER — HYDROCODONE-ACETAMINOPHEN 5-325 MG PO TABS: 1.0000 | ORAL_TABLET | ORAL | Status: DC | PRN

## 2015-07-25 NOTE — Telephone Encounter (Signed)
07/20/15 Patient made aware.

## 2015-07-25 NOTE — Progress Notes (Signed)
Patient ID:Lindsay Salazar, female DOB:07-05-1950, 65 y.o. [id number]  Chief Complaint  Patient presents with  . Follow-up    3 month follow up Right hip    HPI  Lindsay Salazar is a 65 y.o. female who has chronic right hip pain.  She had a cardioversion on 07-20-15.  After the procedure she had more pain of the right hip and some swelling of the hip area and right leg.  The swelling has gone.  Her pain is less.  She has no trauma.    HPI  Body mass index is 22.34 kg/(m^2).  ROS  Review of Systems  HENT: Negative for congestion.   Respiratory: Positive for shortness of breath. Negative for cough.   Cardiovascular: Positive for chest pain and leg swelling.  Endocrine: Positive for cold intolerance.  Musculoskeletal: Positive for arthralgias and gait problem.  Allergic/Immunologic: Positive for environmental allergies.    Past Medical History  Diagnosis Date  . COPD (chronic obstructive pulmonary disease) (HCC)     not on home o2  . Fibromyalgia   . Hypertension   . Chronic right hip pain   . Atrial fibrillation ([place])   . Anginal pain ([place])     thought she was having   heartburn  . Heart murmur     17-  22 years old  . Asthma   . GERD (gastroesophageal reflux disease)     use to have it but not now  . Arthritis     bursitis , fibromyalgia    Past Surgical History  Procedure Laterality Date  . Neck surgery    . Tonsillectomy    . Tubal ligation    . Cardioversion N/A 07/18/2015    Procedure: CARDIOVERSION;  Surgeon: Josue Hector, MD;  [facility];  Service: Cardiovascular;  Laterality: N/A;    Family History  Problem Relation Age of Onset  . COPD Mother   . Diabetes Father   . Stroke Father     Social History Social History  Substance Use Topics  . Smoking status: Current Some Day Smoker -- 0.25 packs/day for 6 years    Types: Cigarettes  . Smokeless tobacco: None  . Alcohol Use: No    Allergies  Allergen Reactions  . Ibuprofen Nausea And  Vomiting  . Penicillins Swelling    Has patient had a PCN reaction causing immediate rash, facial/tongue/throat swelling, SOB or lightheadedness with hypotension: Yes Has patient had a PCN reaction causing severe rash involving mucus membranes or skin necrosis: No Has patient had a PCN reaction that required hospitalization No Has patient had a PCN reaction occurring within the last 10 years: No If all of the above answers are "NO", then may proceed with Cephalosporin use.   . Tiotropium Bromide Monohydrate Itching  . Tramadol Nausea And Vomiting  . Vicodin [Hydrocodone-Acetaminophen] Nausea And Vomiting    Current Outpatient Prescriptions  Medication Sig Dispense Refill  . albuterol (PROVENTIL) (2.5 MG/3ML) 0.083% nebulizer solution Take 3 mLs (2.5 mg total) by nebulization every 6 (six) hours as needed for wheezing or shortness of breath. 75 mL 12  . albuterol (VENTOLIN HFA) 108 (90 BASE) MCG/ACT inhaler Inhale 2 puffs into the lungs 2 (two) times daily. For shortness of breath    . albuterol-ipratropium (COMBIVENT) 18-103 MCG/ACT inhaler Inhale 2 puffs into the lungs 2 (two) times daily. For shortness of breath 1 Inhaler 2  . diltiazem (CARDIZEM CD) 120 MG 24 hr capsule Take 1 capsule (120 mg total)  by mouth 2 (two) times daily. 60 capsule 6  . escitalopram (LEXAPRO) 10 MG tablet Take 10 mg by mouth every evening.     . furosemide (LASIX) 20 MG tablet Take 1 tablet (20 mg total) by mouth daily as needed for edema. (Patient taking differently: Take 20 mg by mouth daily. ) 90 tablet 3  . HYDROcodone-acetaminophen (NORCO/VICODIN) 5-325 MG tablet Take 1 tablet by mouth every 4 (four) hours as needed for moderate pain (Must last 30 days.  Do not take and drive a car or use machinery.). 120 tablet 0  . levofloxacin (LEVAQUIN) 750 MG tablet Take 1 tablet (750 mg total) by mouth daily. 5 tablet 0  . meloxicam (MOBIC) 7.5 MG tablet Take 7.5 mg by mouth at bedtime.    . pantoprazole (PROTONIX) 40 MG  tablet Take 1 tablet (40 mg total) by mouth daily. 30 tablet 0  . predniSONE (DELTASONE) 10 MG tablet 6 tablets 5 days, 5 tablets 5 days, 4 tablets 5 days, 3 tablets 5 days, 2 tablets 5 days, 1 tablet 5 days, half a tablet 5 days then discontinue 130 tablet 0  . rivaroxaban (XARELTO) 20 MG TABS tablet Take 1 tablet (20 mg total) by mouth daily with supper. 30 tablet 10  . SYMBICORT 160-4.5 MCG/ACT inhaler Inhale 1 puff into the lungs 2 (two) times daily.     No current facility-administered medications for this visit.     Physical Exam  Blood pressure 124/61, pulse 84, temperature 97.3 F (36.3 C), height 5\' 4"  (1.626 m), weight 130 lb 3.2 oz (59.058 kg).  Constitutional: overall normal hygiene, normal nutrition, well developed, normal grooming, normal body habitus. Assistive device:none  Musculoskeletal: gait and station Limp right, muscle tone and strength are normal, no tremors or atrophy is present.  .  Neurological: coordination overall normal.  Deep tendon reflex/nerve stretch intact.  Sensation normal.  Cranial nerves II-XII intact.   Skin:   normal overall no scars, lesions, ulcers or rashes. No psoriasis.  Psychiatric: Alert and oriented x 3.  Recent memory intact, remote memory unclear.  Normal mood and affect. Well groomed.  Good eye contact.  Cardiovascular: overall no swelling, no varicosities, no edema bilaterally, normal temperatures of the legs and arms, no clubbing, cyanosis and good capillary refill.  Lymphatic: palpation is normal.  The right hip has full motion but is tender.  Gait is a slight right limp.  NV intact.  Left hip is not tender.  X-rays were done of the right hip, reported separately.  The patient has been educated about the nature of the problem(s) and counseled on treatment options.  The patient appeared to understand what I have discussed and is in agreement with it.  Encounter Diagnosis  Name Primary?  . Right hip pain Yes     PLAN Call if any problems.  Precautions discussed.  Continue current medications.   Return to clinic 3 months

## 2015-08-02 ENCOUNTER — Encounter: Payer: Self-pay | Admitting: Adult Health

## 2015-08-02 ENCOUNTER — Ambulatory Visit (INDEPENDENT_AMBULATORY_CARE_PROVIDER_SITE_OTHER): Payer: Medicare HMO | Admitting: Adult Health

## 2015-08-02 VITALS — BP 148/88 | HR 77 | Ht 64.0 in | Wt 147.0 lb

## 2015-08-02 DIAGNOSIS — J439 Emphysema, unspecified: Secondary | ICD-10-CM | POA: Diagnosis not present

## 2015-08-02 DIAGNOSIS — Z720 Tobacco use: Secondary | ICD-10-CM

## 2015-08-02 DIAGNOSIS — I481 Persistent atrial fibrillation: Secondary | ICD-10-CM

## 2015-08-02 DIAGNOSIS — I4891 Unspecified atrial fibrillation: Secondary | ICD-10-CM | POA: Diagnosis not present

## 2015-08-02 DIAGNOSIS — I4819 Other persistent atrial fibrillation: Secondary | ICD-10-CM

## 2015-08-02 NOTE — Patient Instructions (Signed)
Medication Instructions:  Your physician recommends that you continue on your current medications as directed. Please refer to the Current Medication list given to you today.   Labwork: NONE  Testing/Procedures: NONE  Follow-Up: Your physician recommends that you schedule a follow-up appointment in: 3 MONTHS    Any Other Special Instructions Will Be Listed Below (If Applicable).     If you need a refill on your cardiac medications before your next appointment, please call your pharmacy.   

## 2015-08-02 NOTE — Progress Notes (Signed)
Cardiology Office Note   Date:  08/02/2015   ID:  Lindsay Salazar, DOB Jan 29, 1951, MRN [id number]  PCP:  [name], PA-C  Cardiologist:Branch/  Jory Sims, NP   Chief Complaint  Patient presents with  . Atrial Fibrillation      History of Present Illness: Lindsay Salazar is a 65 y.o. female who presents for ongoing assessment and management of atrial fib/flutter. He was recently admitted to the hospital and had DCCV by Dr. Johnsie Cancel on 07/18/2015. He was continued on Xarleto 20 mg daily CHADS VASC Score of 3,  and continued on diltiazem 120 mg BID. Other history includes COPD, .hypertension, and fibromyalgia.   On followup visit today, EKG revealed that she has returned to atrial fibrillation, but is rate controlled. She states that she has had exacerbation of her COPD, and has been under a lot of stress. She is also stopped taking her Lasix. She has gained approximately 11 pounds. She has run out of her medications for COPD to include her nebulizer solutions and Combivent. She states that she will refill them tomorrow when she gets her check.     Past Medical History  Diagnosis Date  . COPD (chronic obstructive pulmonary disease) (HCC)     not on home o2  . Fibromyalgia   . Hypertension   . Chronic right hip pain   . Atrial fibrillation ([place])   . Anginal pain ([place])     thought she was having   heartburn  . Heart murmur     83-  42 years old  . Asthma   . GERD (gastroesophageal reflux disease)     use to have it but not now  . Arthritis     bursitis , fibromyalgia    Past Surgical History  Procedure Laterality Date  . Neck surgery    . Tonsillectomy    . Tubal ligation    . Cardioversion N/A 07/18/2015    Procedure: CARDIOVERSION;  Surgeon: Josue Hector, MD;  Location[facility];  Service: Cardiovascular;  Laterality: N/A;     Current Outpatient Prescriptions  Medication Sig Dispense Refill  . albuterol (PROVENTIL) (2.5 MG/3ML) 0.083% nebulizer solution Take 3  mLs (2.5 mg total) by nebulization every 6 (six) hours as needed for wheezing or shortness of breath. 75 mL 12  . albuterol (VENTOLIN HFA) 108 (90 BASE) MCG/ACT inhaler Inhale 2 puffs into the lungs 2 (two) times daily. For shortness of breath    . albuterol-ipratropium (COMBIVENT) 18-103 MCG/ACT inhaler Inhale 2 puffs into the lungs 2 (two) times daily. For shortness of breath 1 Inhaler 2  . diltiazem (CARDIZEM CD) 120 MG 24 hr capsule Take 1 capsule (120 mg total) by mouth 2 (two) times daily. 60 capsule 6  . escitalopram (LEXAPRO) 10 MG tablet Take 10 mg by mouth every evening.     . furosemide (LASIX) 20 MG tablet Take 1 tablet (20 mg total) by mouth daily as needed for edema. (Patient taking differently: Take 20 mg by mouth daily. ) 90 tablet 3  . HYDROcodone-acetaminophen (NORCO/VICODIN) 5-325 MG tablet Take 1 tablet by mouth every 4 (four) hours as needed for moderate pain (Must last 30 days.  Do not take and drive a car or use machinery.). 120 tablet 0  . levofloxacin (LEVAQUIN) 750 MG tablet Take 1 tablet (750 mg total) by mouth daily. 5 tablet 0  . meloxicam (MOBIC) 7.5 MG tablet Take 7.5 mg by mouth at bedtime.    Marland Kitchen  pantoprazole (PROTONIX) 40 MG tablet Take 1 tablet (40 mg total) by mouth daily. 30 tablet 0  . predniSONE (DELTASONE) 10 MG tablet 6 tablets 5 days, 5 tablets 5 days, 4 tablets 5 days, 3 tablets 5 days, 2 tablets 5 days, 1 tablet 5 days, half a tablet 5 days then discontinue 130 tablet 0  . rivaroxaban (XARELTO) 20 MG TABS tablet Take 1 tablet (20 mg total) by mouth daily with supper. 30 tablet 10  . SYMBICORT 160-4.5 MCG/ACT inhaler Inhale 1 puff into the lungs 2 (two) times daily.     No current facility-administered medications for this visit.    Allergies:   Ibuprofen; Penicillins; Tiotropium bromide monohydrate; Tramadol; and Vicodin    Social History:  The patient  reports that she has been smoking Cigarettes.  She has a 1.5 pack-year smoking history. She does  not have any smokeless tobacco history on file. She reports that she does not drink alcohol or use illicit drugs.   Family History:  The patient's family history includes COPD in her mother; Diabetes in her father; Stroke in her father.    ROS: All other systems are reviewed and negative. Unless otherwise mentioned in H&P    PHYSICAL EXAM: VS:  BP 148/88 mmHg  Pulse 77  Ht 5\' 4"  (1.626 m)  Wt 147 lb (66.679 kg)  BMI 25.22 kg/m2  SpO2 99% , BMI Body mass index is 25.22 kg/(m^2). GEN: Well nourished, well developed, in no acute distressSignificant malodorous body scent.  HEENT: normal Neck: no JVD, carotid bruits, or masses Cardiac: IRRR; no murmurs, rubs, or gallops,no edema  Respiratory: bilateral crackles and some inspiratory wheezes. GI: soft, nontender, nondistended, + BS MS: no deformity or atrophy Skin: warm and dry, no rash Neuro:  Strength and sensation are intact Psych: euthymic mood, full affect   EKG:  EKG ordered today. The ekg ordered today demonstrates atrial fibrillation heart rate 112 beats per minute with nonspecific ST depression. Apical heart rate is in the 80s.   Recent Labs: 06/27/2015: Magnesium 1.8; TSH 2.678 07/17/2015: B Natriuretic Peptide 415.0* 07/19/2015: ALT 34; BUN 32*; Creatinine, Ser 0.90; Hemoglobin 9.8*; Platelets 219; Potassium 4.3; Sodium 133*    Lipid Panel No results found for: CHOL, TRIG, HDL, CHOLHDL, VLDL, LDLCALC, LDLDIRECT    Wt Readings from Last 3 Encounters:  08/02/15 147 lb (66.679 kg)  07/25/15 130 lb 3.2 oz (59.058 kg)  07/19/15 143 lb (64.864 kg)     ASSESSMENT AND PLAN:  1.  Atrial fibrillation: the patient did have a cardioversion completed by Dr. Admission during recent hospitalization however the patient has reverted back to atrial fibrillation from normal sinus rhythm since discharge. The patient admits that she has had exacerbations of COPD, and has run out of her medications. She has also stopped taking Lasix and  has gained approximately 10 pounds.  The patient will not be referred back for a another cardioversion as she has issues with noncompliance. She states that she is taking her anticoagulation as directed. The patient will continue medical management with diltiazem 120 mg twice a day, Lasix, and XARELTO. She will be seen again in 3 months without changes.  2. COPD:she continues with exacerbations and has run out of her inhalers and nebulizer solution. She will be refilling them tomorrow when she receives a check in the mail. The patient is advised to follow with her primary care physician and possible referral to pulmonology. She has been seen by a pulmonologist in the past but  has not continued care with him.  3. Ongoing tobacco abuse:she has been recommended immediate cessation.   Current medicines are reviewed at length with the patient today.    Labs/ tests ordered today include: None  Orders Placed This Encounter  Procedures  . EKG 12-Lead     Disposition:   FU with 3 months.  Signed, Jory Sims, NP  08/02/2015 3:22 PM    [facility] [address], [place], [place] [address] Phone: [phone]; Fax: [phone]

## 2015-08-02 NOTE — Progress Notes (Signed)
Name: Lindsay Salazar    DOB: April 26, 1950  Age: 65 y.o.  MR#: [id number]       PCP:  [name], PA-C      Insurance: Payor: [employer] MEDICARE / Plan: [place] / Product Type: *No Product type* /   CC:   No chief complaint on file.   VS Filed Vitals:   08/02/15 1345  BP: 148/88  Pulse: 77  Height: 5\' 4"  (1.626 m)  Weight: 147 lb (66.679 kg)  SpO2: 99%    Weights Current Weight  08/02/15 147 lb (66.679 kg)  07/25/15 130 lb 3.2 oz (59.058 kg)  07/19/15 143 lb (64.864 kg)    Blood Pressure  BP Readings from Last 3 Encounters:  08/02/15 148/88  07/25/15 124/61  07/19/15 111/81     Admit date:  (Not on file) Last encounter with RMR:  07/13/2015   Allergy Ibuprofen; Penicillins; Tiotropium bromide monohydrate; Tramadol; and Vicodin  Current Outpatient Prescriptions  Medication Sig Dispense Refill  . albuterol (PROVENTIL) (2.5 MG/3ML) 0.083% nebulizer solution Take 3 mLs (2.5 mg total) by nebulization every 6 (six) hours as needed for wheezing or shortness of breath. 75 mL 12  . albuterol (VENTOLIN HFA) 108 (90 BASE) MCG/ACT inhaler Inhale 2 puffs into the lungs 2 (two) times daily. For shortness of breath    . albuterol-ipratropium (COMBIVENT) 18-103 MCG/ACT inhaler Inhale 2 puffs into the lungs 2 (two) times daily. For shortness of breath 1 Inhaler 2  . diltiazem (CARDIZEM CD) 120 MG 24 hr capsule Take 1 capsule (120 mg total) by mouth 2 (two) times daily. 60 capsule 6  . escitalopram (LEXAPRO) 10 MG tablet Take 10 mg by mouth every evening.     . furosemide (LASIX) 20 MG tablet Take 1 tablet (20 mg total) by mouth daily as needed for edema. (Patient taking differently: Take 20 mg by mouth daily. ) 90 tablet 3  . HYDROcodone-acetaminophen (NORCO/VICODIN) 5-325 MG tablet Take 1 tablet by mouth every 4 (four) hours as needed for moderate pain (Must last 30 days.  Do not take and drive a car or use machinery.). 120 tablet 0  . levofloxacin (LEVAQUIN) 750 MG tablet Take 1  tablet (750 mg total) by mouth daily. 5 tablet 0  . meloxicam (MOBIC) 7.5 MG tablet Take 7.5 mg by mouth at bedtime.    . pantoprazole (PROTONIX) 40 MG tablet Take 1 tablet (40 mg total) by mouth daily. 30 tablet 0  . predniSONE (DELTASONE) 10 MG tablet 6 tablets 5 days, 5 tablets 5 days, 4 tablets 5 days, 3 tablets 5 days, 2 tablets 5 days, 1 tablet 5 days, half a tablet 5 days then discontinue 130 tablet 0  . rivaroxaban (XARELTO) 20 MG TABS tablet Take 1 tablet (20 mg total) by mouth daily with supper. 30 tablet 10  . SYMBICORT 160-4.5 MCG/ACT inhaler Inhale 1 puff into the lungs 2 (two) times daily.     No current facility-administered medications for this visit.    Discontinued Meds:   There are no discontinued medications.  Patient Active Problem List   Diagnosis Date Noted  . Dyspnea   . Elevated troponin I level 07/17/2015  . Hyperglycemia 07/17/2015  . Anemia 07/17/2015  . Atrial fibrillation ([place]) 07/17/2015  . COPD exacerbation ([place])   . Atrial flutter ([place]) 06/27/2015  . Atrial fibrillation with RVR ([place]) 06/27/2015  . COPD with exacerbation ([place]) 06/27/2015  . Hypertensive urgency 06/27/2015  . Tobacco abuse 06/27/2015  . Elevated  troponin 06/27/2015  . CAP (community acquired pneumonia)   . OSTEOPOROSIS 04/10/2009  . WEIGHT LOSS 04/10/2009    LABS    Component Value Date/Time   NA 133* 07/19/2015 0514   NA 133* 07/18/2015 0443   NA 140 07/17/2015 0358   K 4.3 07/19/2015 0514   K 3.9 07/18/2015 0443   K 4.1 07/17/2015 0358   CL 103 07/19/2015 0514   CL 104 07/18/2015 0443   CL 108 07/17/2015 0358   CO2 23 07/19/2015 0514   CO2 23 07/18/2015 0443   CO2 27 07/17/2015 0358   GLUCOSE 158* 07/19/2015 0514   GLUCOSE 156* 07/18/2015 0443   GLUCOSE 119* 07/17/2015 0358   BUN 32* 07/19/2015 0514   BUN 36* 07/18/2015 0443   BUN 30* 07/17/2015 0358   CREATININE 0.90 07/19/2015 0514   CREATININE 0.87 07/18/2015 0443   CREATININE 0.95 07/17/2015 0358    CALCIUM 8.4* 07/19/2015 0514   CALCIUM 8.5* 07/18/2015 0443   CALCIUM 8.2* 07/17/2015 0358   GFRNONAA >60 07/19/2015 0514   GFRNONAA >60 07/18/2015 0443   GFRNONAA >60 07/17/2015 0358   GFRAA >60 07/19/2015 0514   GFRAA >60 07/18/2015 0443   GFRAA >60 07/17/2015 0358   CMP     Component Value Date/Time   NA 133* 07/19/2015 0514   K 4.3 07/19/2015 0514   CL 103 07/19/2015 0514   CO2 23 07/19/2015 0514   GLUCOSE 158* 07/19/2015 0514   BUN 32* 07/19/2015 0514   CREATININE 0.90 07/19/2015 0514   CALCIUM 8.4* 07/19/2015 0514   PROT 5.5* 07/19/2015 0514   ALBUMIN 3.0* 07/19/2015 0514   AST 21 07/19/2015 0514   ALT 34 07/19/2015 0514   ALKPHOS 56 07/19/2015 0514   BILITOT 0.8 07/19/2015 0514   GFRNONAA >60 07/19/2015 0514   GFRAA >60 07/19/2015 0514       Component Value Date/Time   WBC 16.0* 07/19/2015 0514   WBC 16.2* 07/18/2015 0443   WBC 15.1* 07/17/2015 0358   HGB 9.8* 07/19/2015 0514   HGB 10.8* 07/18/2015 0443   HGB 11.0* 07/17/2015 0358   HCT 29.4* 07/19/2015 0514   HCT 32.2* 07/18/2015 0443   HCT 33.4* 07/17/2015 0358   MCV 96.1 07/19/2015 0514   MCV 96.4 07/18/2015 0443   MCV 97.4 07/17/2015 0358    Lipid Panel  No results found for: CHOL, TRIG, HDL, CHOLHDL, VLDL, LDLCALC, LDLDIRECT  ABG No results found for: PHART, PCO2ART, PO2ART, HCO3, TCO2, ACIDBASEDEF, O2SAT   Lab Results  Component Value Date   TSH 2.678 06/27/2015   BNP (last 3 results)  Recent Labs  06/27/15 1537 07/17/15 0358  BNP 880.0* 415.0*    ProBNP (last 3 results) No results for input(s): PROBNP in the last 8760 hours.  Cardiac Panel (last 3 results) No results for input(s): CKTOTAL, CKMB, TROPONINI, RELINDX in the last 72 hours.  Iron/TIBC/Ferritin/ %Sat No results found for: IRON, TIBC, FERRITIN, IRONPCTSAT   EKG Orders placed or performed during the hospital encounter of 07/17/15  . ED EKG  . ED EKG  . EKG 12-Lead  . EKG 12-Lead  . EKG  . EKG 12-Lead  . EKG  12-Lead  . EKG 12-Lead  . EKG 12-Lead     Prior Assessment and Plan Problem List as of 08/02/2015      Cardiovascular and Mediastinum   Atrial flutter (H[facility])   Atrial fibrillation with RVR ([place])   Hypertensive urgency   Atrial fibrillation ([place])     Respiratory  COPD with exacerbation ([place])   CAP (community acquired pneumonia)   COPD exacerbation ([place])     Musculoskeletal and Integument   OSTEOPOROSIS     Other   WEIGHT LOSS   Tobacco abuse   Elevated troponin   Elevated troponin I level   Hyperglycemia   Anemia   Dyspnea       Imaging: Dg Chest 2 View  07/17/2015  CLINICAL DATA:  Acute onset of shortness of breath and congestion. Lower extremity swelling. Initial encounter. EXAM: CHEST  2 VIEW COMPARISON:  Chest radiograph performed 06/27/2015 FINDINGS: The lungs are hyperexpanded, with flattening of the hemidiaphragms, compatible with COPD. Vascular congestion is noted. Increased interstitial markings raise concern for mild interstitial edema. There is no evidence of focal opacification, pleural effusion or pneumothorax. The heart is normal in size; the mediastinal contour is within normal limits. No acute osseous abnormalities are seen. Cervical spinal fusion hardware is noted. IMPRESSION: 1. Vascular congestion. Increased interstitial markings raise concern for mild pulmonary edema. 2. Findings of COPD. Electronically Signed   By: Garald Balding M.D.   On: 07/17/2015 03:56   Dg Hip Unilat With Pelvis 2-3 Views Right  07/25/2015  Clinical:  Right hip pain, no trauma X-rays were done of the right hip, two views There is mild degenerative changes of the right hip, more inferiorly, with no fracture present.  Bone quality is good.   07/25/2015   Mild degenerative changes of the right hip, no acute findings. Electronically Signed Sanjuana Kava, MD 6/21/20179:47 AM

## 2015-08-08 ENCOUNTER — Encounter (HOSPITAL_COMMUNITY): Payer: Self-pay | Admitting: Emergency Medicine

## 2015-08-08 ENCOUNTER — Emergency Department (HOSPITAL_COMMUNITY): Payer: Medicare HMO

## 2015-08-08 ENCOUNTER — Inpatient Hospital Stay (HOSPITAL_COMMUNITY)
Admission: EM | Admit: 2015-08-08 | Discharge: 2015-08-12 | DRG: 378 | Disposition: A | Discharge status: Home Health | Payer: Medicare HMO | Attending: Family Medicine | Admitting: Family Medicine

## 2015-08-08 DIAGNOSIS — T39395A Adverse effect of other nonsteroidal anti-inflammatory drugs [NSAID], initial encounter: Secondary | ICD-10-CM | POA: Diagnosis present

## 2015-08-08 DIAGNOSIS — K2960 Other gastritis without bleeding: Secondary | ICD-10-CM | POA: Diagnosis not present

## 2015-08-08 DIAGNOSIS — H269 Unspecified cataract: Secondary | ICD-10-CM | POA: Diagnosis present

## 2015-08-08 DIAGNOSIS — Z7952 Long term (current) use of systemic steroids: Secondary | ICD-10-CM

## 2015-08-08 DIAGNOSIS — M1990 Unspecified osteoarthritis, unspecified site: Secondary | ICD-10-CM | POA: Diagnosis present

## 2015-08-08 DIAGNOSIS — T380X5A Adverse effect of glucocorticoids and synthetic analogues, initial encounter: Secondary | ICD-10-CM | POA: Diagnosis not present

## 2015-08-08 DIAGNOSIS — M797 Fibromyalgia: Secondary | ICD-10-CM | POA: Diagnosis present

## 2015-08-08 DIAGNOSIS — R000 Tachycardia, unspecified: Secondary | ICD-10-CM | POA: Diagnosis not present

## 2015-08-08 DIAGNOSIS — D62 Acute posthemorrhagic anemia: Secondary | ICD-10-CM | POA: Diagnosis present

## 2015-08-08 DIAGNOSIS — K274 Chronic or unspecified peptic ulcer, site unspecified, with hemorrhage: Secondary | ICD-10-CM | POA: Diagnosis present

## 2015-08-08 DIAGNOSIS — K921 Melena: Secondary | ICD-10-CM | POA: Diagnosis not present

## 2015-08-08 DIAGNOSIS — M719 Bursopathy, unspecified: Secondary | ICD-10-CM | POA: Diagnosis not present

## 2015-08-08 DIAGNOSIS — K219 Gastro-esophageal reflux disease without esophagitis: Secondary | ICD-10-CM | POA: Diagnosis present

## 2015-08-08 DIAGNOSIS — K319 Disease of stomach and duodenum, unspecified: Secondary | ICD-10-CM | POA: Diagnosis present

## 2015-08-08 DIAGNOSIS — D649 Anemia, unspecified: Secondary | ICD-10-CM

## 2015-08-08 DIAGNOSIS — K259 Gastric ulcer, unspecified as acute or chronic, without hemorrhage or perforation: Secondary | ICD-10-CM | POA: Diagnosis not present

## 2015-08-08 DIAGNOSIS — J9611 Chronic respiratory failure with hypoxia: Secondary | ICD-10-CM | POA: Diagnosis present

## 2015-08-08 DIAGNOSIS — K449 Diaphragmatic hernia without obstruction or gangrene: Secondary | ICD-10-CM | POA: Diagnosis present

## 2015-08-08 DIAGNOSIS — R7989 Other specified abnormal findings of blood chemistry: Secondary | ICD-10-CM | POA: Diagnosis not present

## 2015-08-08 DIAGNOSIS — Z9119 Patient's noncompliance with other medical treatment and regimen: Secondary | ICD-10-CM

## 2015-08-08 DIAGNOSIS — Z23 Encounter for immunization: Secondary | ICD-10-CM

## 2015-08-08 DIAGNOSIS — I248 Other forms of acute ischemic heart disease: Secondary | ICD-10-CM | POA: Diagnosis present

## 2015-08-08 DIAGNOSIS — G8929 Other chronic pain: Secondary | ICD-10-CM | POA: Diagnosis not present

## 2015-08-08 DIAGNOSIS — Z7901 Long term (current) use of anticoagulants: Secondary | ICD-10-CM | POA: Diagnosis not present

## 2015-08-08 DIAGNOSIS — T39015A Adverse effect of aspirin, initial encounter: Secondary | ICD-10-CM | POA: Diagnosis not present

## 2015-08-08 DIAGNOSIS — I4891 Unspecified atrial fibrillation: Secondary | ICD-10-CM | POA: Diagnosis not present

## 2015-08-08 DIAGNOSIS — Z7982 Long term (current) use of aspirin: Secondary | ICD-10-CM

## 2015-08-08 DIAGNOSIS — K3189 Other diseases of stomach and duodenum: Secondary | ICD-10-CM | POA: Diagnosis not present

## 2015-08-08 DIAGNOSIS — I10 Essential (primary) hypertension: Secondary | ICD-10-CM | POA: Diagnosis present

## 2015-08-08 DIAGNOSIS — K2970 Gastritis, unspecified, without bleeding: Secondary | ICD-10-CM | POA: Diagnosis present

## 2015-08-08 DIAGNOSIS — J449 Chronic obstructive pulmonary disease, unspecified: Secondary | ICD-10-CM | POA: Diagnosis not present

## 2015-08-08 DIAGNOSIS — I480 Paroxysmal atrial fibrillation: Secondary | ICD-10-CM | POA: Diagnosis present

## 2015-08-08 DIAGNOSIS — F17210 Nicotine dependence, cigarettes, uncomplicated: Secondary | ICD-10-CM | POA: Diagnosis present

## 2015-08-08 DIAGNOSIS — K922 Gastrointestinal hemorrhage, unspecified: Secondary | ICD-10-CM

## 2015-08-08 DIAGNOSIS — Z9981 Dependence on supplemental oxygen: Secondary | ICD-10-CM

## 2015-08-08 DIAGNOSIS — K228 Other specified diseases of esophagus: Secondary | ICD-10-CM | POA: Diagnosis not present

## 2015-08-08 HISTORY — DX: Pneumothorax, unspecified: J93.9

## 2015-08-08 LAB — CBC WITH DIFFERENTIAL/PLATELET
BASOS ABS: 0 10*3/uL (ref 0.0–0.1)
BASOS PCT: 0 %
Basophils Absolute: 0 10*3/uL (ref 0.0–0.1)
Basophils Relative: 0 %
EOS PCT: 0 %
Eosinophils Absolute: 0 10*3/uL (ref 0.0–0.7)
Eosinophils Absolute: 0 10*3/uL (ref 0.0–0.7)
Eosinophils Relative: 0 %
HCT: 15 % — ABNORMAL LOW (ref 36.0–46.0)
HEMATOCRIT: 13.7 % — AB (ref 36.0–46.0)
HEMOGLOBIN: 4.7 g/dL — AB (ref 12.0–15.0)
HEMOGLOBIN: 4.4 g/dL — AB (ref 12.0–15.0)
LYMPHS PCT: 12 %
Lymphocytes Relative: 11 %
Lymphs Abs: 1.8 10*3/uL (ref 0.7–4.0)
Lymphs Abs: 1.8 10*3/uL (ref 0.7–4.0)
MCH: 30.3 pg (ref 26.0–34.0)
MCH: 30.8 pg (ref 26.0–34.0)
MCHC: 31.3 g/dL (ref 30.0–36.0)
MCHC: 32.1 g/dL (ref 30.0–36.0)
MCV: 96.8 fL (ref 78.0–100.0)
MCV: 95.8 fL (ref 78.0–100.0)
MONO ABS: 0.5 10*3/uL (ref 0.1–1.0)
MONO ABS: 0.6 10*3/uL (ref 0.1–1.0)
Monocytes Relative: 3 %
Monocytes Relative: 4 %
NEUTROS ABS: 13.5 10*3/uL — AB (ref 1.7–7.7)
NEUTROS PCT: 86 %
Neutro Abs: 12.7 10*3/uL — ABNORMAL HIGH (ref 1.7–7.7)
Neutrophils Relative %: 84 %
PLATELETS: 256 10*3/uL (ref 150–400)
Platelets: 219 10*3/uL (ref 150–400)
RBC: 1.55 MIL/uL — ABNORMAL LOW (ref 3.87–5.11)
RBC: 1.43 MIL/uL — ABNORMAL LOW (ref 3.87–5.11)
RDW: 16.8 % — ABNORMAL HIGH (ref 11.5–15.5)
RDW: 16.8 % — ABNORMAL HIGH (ref 11.5–15.5)
WBC: 15.7 10*3/uL — ABNORMAL HIGH (ref 4.0–10.5)
WBC: 15.1 10*3/uL — AB (ref 4.0–10.5)

## 2015-08-08 LAB — BASIC METABOLIC PANEL
ANION GAP: 11 (ref 5–15)
BUN: 49 mg/dL — ABNORMAL HIGH (ref 6–20)
CALCIUM: 7.7 mg/dL — AB (ref 8.9–10.3)
CO2: 22 mmol/L (ref 22–32)
Chloride: 105 mmol/L (ref 101–111)
Creatinine, Ser: 1.02 mg/dL — ABNORMAL HIGH (ref 0.44–1.00)
GFR, EST NON AFRICAN AMERICAN: 56 mL/min — AB (ref >60)
Glucose, Bld: 198 mg/dL — ABNORMAL HIGH (ref 65–99)
Potassium: 4.4 mmol/L (ref 3.5–5.1)
Sodium: 138 mmol/L (ref 135–145)

## 2015-08-08 LAB — MRSA PCR SCREENING: MRSA by PCR: NEGATIVE

## 2015-08-08 LAB — TROPONIN I
TROPONIN I: 0.04 ng/mL — AB (ref <0.03)
TROPONIN I: 0.03 ng/mL — AB (ref <0.03)

## 2015-08-08 LAB — PREPARE RBC (CROSSMATCH)

## 2015-08-08 LAB — ABO/RH: ABO/RH(D): O POS

## 2015-08-08 MED ORDER — PANTOPRAZOLE SODIUM 40 MG IV SOLR: 40.0000 mg | Freq: Two times a day (BID) | INTRAVENOUS | Status: DC

## 2015-08-08 MED ORDER — SODIUM CHLORIDE 0.9 % IV SOLN
Freq: Once | INTRAVENOUS | Status: AC
  Administered 2015-08-08: 21:00:00 via INTRAVENOUS

## 2015-08-08 MED ORDER — SODIUM CHLORIDE 0.9 % IV SOLN: 250.0000 mL | INTRAVENOUS | Status: DC | PRN

## 2015-08-08 MED ORDER — PANTOPRAZOLE SODIUM 40 MG IV SOLR
80.0000 mg | Freq: Once | INTRAVENOUS | Status: AC
  Administered 2015-08-08: 80 mg via INTRAVENOUS
  Filled 2015-08-08: qty 80

## 2015-08-08 MED ORDER — IPRATROPIUM-ALBUTEROL 0.5-2.5 (3) MG/3ML IN SOLN
3.0000 mL | Freq: Four times a day (QID) | RESPIRATORY_TRACT | Status: DC
  Administered 2015-08-08 – 2015-08-12 (×16): 3 mL via RESPIRATORY_TRACT
  Filled 2015-08-08 (×16): qty 3

## 2015-08-08 MED ORDER — ACETAMINOPHEN 325 MG PO TABS: 650.0000 mg | ORAL_TABLET | ORAL | Status: DC | PRN

## 2015-08-08 MED ORDER — PNEUMOCOCCAL VAC POLYVALENT 25 MCG/0.5ML IJ INJ
0.5000 mL | INJECTION | INTRAMUSCULAR | Status: AC
  Administered 2015-08-09: 0.5 mL via INTRAMUSCULAR
  Filled 2015-08-08: qty 0.5

## 2015-08-08 MED ORDER — SODIUM CHLORIDE 0.9 % IV SOLN
8.0000 mg/h | INTRAVENOUS | Status: DC
  Administered 2015-08-08 – 2015-08-10 (×4): 8 mg/h via INTRAVENOUS
  Filled 2015-08-08 (×9): qty 80

## 2015-08-08 MED ORDER — ALBUTEROL SULFATE (2.5 MG/3ML) 0.083% IN NEBU: 2.5000 mg | INHALATION_SOLUTION | RESPIRATORY_TRACT | Status: DC | PRN

## 2015-08-08 MED ORDER — NICOTINE 14 MG/24HR TD PT24
14.0000 mg | MEDICATED_PATCH | Freq: Every day | TRANSDERMAL | Status: DC
  Administered 2015-08-08 – 2015-08-12 (×5): 14 mg via TRANSDERMAL
  Filled 2015-08-08 (×5): qty 1

## 2015-08-08 MED ORDER — MORPHINE SULFATE (PF) 2 MG/ML IV SOLN
2.0000 mg | INTRAVENOUS | Status: DC | PRN
  Administered 2015-08-08 – 2015-08-10 (×9): 2 mg via INTRAVENOUS
  Filled 2015-08-08 (×9): qty 1

## 2015-08-08 MED ORDER — ONDANSETRON HCL 4 MG/2ML IJ SOLN: 4.0000 mg | Freq: Four times a day (QID) | INTRAMUSCULAR | Status: DC | PRN

## 2015-08-08 MED ORDER — DILTIAZEM LOAD VIA INFUSION
10.0000 mg | Freq: Once | INTRAVENOUS | Status: AC
  Administered 2015-08-08: 10 mg via INTRAVENOUS
  Filled 2015-08-08: qty 10

## 2015-08-08 MED ORDER — DILTIAZEM HCL 100 MG IV SOLR
5.0000 mg/h | INTRAVENOUS | Status: DC
  Administered 2015-08-08: 10 mg/h via INTRAVENOUS
  Administered 2015-08-09 – 2015-08-10 (×2): 5 mg/h via INTRAVENOUS
  Filled 2015-08-08 (×4): qty 100

## 2015-08-08 MED ORDER — SODIUM CHLORIDE 0.9% FLUSH: 3.0000 mL | INTRAVENOUS | Status: DC | PRN

## 2015-08-08 MED ORDER — ASPIRIN EC 81 MG PO TBEC
81.0000 mg | DELAYED_RELEASE_TABLET | Freq: Every day | ORAL | Status: DC
  Administered 2015-08-08: 81 mg via ORAL
  Filled 2015-08-08 (×2): qty 1

## 2015-08-08 MED ORDER — ZOLPIDEM TARTRATE 5 MG PO TABS: 5.0000 mg | ORAL_TABLET | Freq: Every evening | ORAL | Status: DC | PRN

## 2015-08-08 MED ORDER — PANTOPRAZOLE SODIUM 40 MG IV SOLR
INTRAVENOUS | Status: AC
  Filled 2015-08-08: qty 160

## 2015-08-08 MED ORDER — METHYLPREDNISOLONE SODIUM SUCC 40 MG IJ SOLR
40.0000 mg | Freq: Two times a day (BID) | INTRAMUSCULAR | Status: DC
  Administered 2015-08-08 – 2015-08-09 (×2): 40 mg via INTRAVENOUS
  Filled 2015-08-08 (×2): qty 1

## 2015-08-08 MED ORDER — ALPRAZOLAM 0.25 MG PO TABS
0.2500 mg | ORAL_TABLET | Freq: Two times a day (BID) | ORAL | Status: DC | PRN
  Administered 2015-08-10: 0.25 mg via ORAL
  Filled 2015-08-08: qty 1

## 2015-08-08 MED ORDER — DILTIAZEM HCL 100 MG IV SOLR
5.0000 mg/h | INTRAVENOUS | Status: DC
  Administered 2015-08-08: 5 mg/h via INTRAVENOUS
  Filled 2015-08-08: qty 100

## 2015-08-08 MED ORDER — SODIUM CHLORIDE 0.9% FLUSH
3.0000 mL | Freq: Two times a day (BID) | INTRAVENOUS | Status: DC
  Administered 2015-08-08 – 2015-08-12 (×7): 3 mL via INTRAVENOUS

## 2015-08-08 MED ORDER — ESCITALOPRAM OXALATE 10 MG PO TABS
10.0000 mg | ORAL_TABLET | Freq: Every evening | ORAL | Status: DC
  Administered 2015-08-09 – 2015-08-11 (×3): 10 mg via ORAL
  Filled 2015-08-08 (×4): qty 1

## 2015-08-08 NOTE — H&P (Signed)
History and Physical    Lindsay Salazar [id number] DOB: Aug 19, 1950 DOA: 08/08/2015  PCP: [name] PA-C Consultants:  Cardiology: [facility] Patient coming from: home  Chief Complaint: racing heart rate, weakness  HPI: Lindsay Salazar is a 65 y.o. female with medical history significant of COPD and afib with RVR requiring 2 recent hospitalizations.  She came in to ER today because "I forogt to take my cardiac medication today on time.  I took it and then I went out like a light.  I got numb on my tongue."  Boyfriend eventually made her get up and come to ER.  +CP below left breast.  + rapid heart rate from the time she got up this AM.  +SOB.  +cough - productive of milky white sputum, not different than the last 3-4 months.  Epigastric burning since this AM.  BMs looked like chocolate the last 3 days.  Extremely weak/tired.  Has cataracts, chronic vision loss.  Also needs teeth pulled.  Occasional numnbess/tingling of hands and .  2 prior admissions in May and June.  She was admitted from 5/24-27 with afib with RVR which was new in onset at that time.  It was thought to be triggered by COPD and uncontrolled HTN.  Troponin 0.04.  Started on a Cardizem drip and transitioned to PO Cardizem, ASA for anticoagulation (CHADS2Vasc score 3).  Treated with Levaquin and Solumedrol/Prednisone.  She was also admitted from 6/13-15 with afib with RVR.  She received 1 dose of IV Cardizem and was electrically cardioverted on 6/14.  She had been started on Xarelto in the interim and this was continued.  Troponin ranged from 0.2-0.15.  She was also treated with ongoing Levaquin and Solumedrol for COPD exacerbation.  Hemoglobin was down from 11 to 9.8 without evidence of bleeding and she was started on a PPI.    ED Course: Recognized melena, heme +, Hgb 4.7, ordered transfusion of 2 units PRBC, started Cardizem drip for afib with RVR  Review of Systems: As per HPI; otherwise 10 point review of systems reviewed and  negative.   Ambulatory Status: normally ambulatory but very weak  Past Medical History  Diagnosis Date  . COPD (chronic obstructive pulmonary disease) ([place])     wears home o2 prn  . Fibromyalgia   . Hypertension   . Chronic right hip pain   . Atrial fibrillation ([place])   . Anginal pain ([place])     thought she was having   heartburn  . Heart murmur     3-  59 years old  . Asthma   . GERD (gastroesophageal reflux disease)     use to have it but not now  . Arthritis     bursitis , fibromyalgia  . Pneumothorax     Past Surgical History  Procedure Laterality Date  . Neck surgery    . Tonsillectomy    . Tubal ligation    . Cardioversion N/A 07/18/2015    Procedure: CARDIOVERSION;  Surgeon: Josue Hector, MD;  Location: [facility];  Service: Cardiovascular;  Laterality: N/A;    Social History   Social History  . Marital Status: Divorced    Spouse Name: N/A  . Number of Children: N/A  . Years of Education: N/A   Occupational History  . Not on file.   Social History Main Topics  . Smoking status: Current Every Day Smoker -- 0.25 packs/day for 6 years    Types: Cigarettes  . Smokeless tobacco:  Not on file     Comment: quit after 2 cigarettes yesterday (7/4)  . Alcohol Use: No     Comment: quit 30 years ago  . Drug Use: No  . Sexual Activity: Yes    Birth Control/ Protection: Surgical, Post-menopausal   Other Topics Concern  . Not on file   Social History Narrative    Allergies  Allergen Reactions  . Ibuprofen Nausea And Vomiting  . Penicillins Swelling    Has patient had a PCN reaction causing immediate rash, facial/tongue/throat swelling, SOB or lightheadedness with hypotension: Yes Has patient had a PCN reaction causing severe rash involving mucus membranes or skin necrosis: No Has patient had a PCN reaction that required hospitalization No Has patient had a PCN reaction occurring within the last 10 years: No If all of the above answers are "NO", then may  proceed with Cephalosporin use.   . Tiotropium Bromide Monohydrate Itching  . Tramadol Nausea And Vomiting  . Vicodin [Hydrocodone-Acetaminophen] Nausea And Vomiting    Family History  Problem Relation Age of Onset  . COPD Mother   . Diabetes Father   . Stroke Father     Prior to Admission medications   Medication Sig Start Date End Date Taking? Authorizing Provider  albuterol (PROVENTIL) (2.5 MG/3ML) 0.083% nebulizer solution Take 3 mLs (2.5 mg total) by nebulization every 6 (six) hours as needed for wheezing or shortness of breath. 07/19/15  Yes Reyne Dumas, MD  albuterol (VENTOLIN HFA) 108 (90 BASE) MCG/ACT inhaler Inhale 2 puffs into the lungs 2 (two) times daily. For shortness of breath   Yes Historical Provider, MD  COMBIVENT RESPIMAT 20-100 MCG/ACT AERS respimat Inhale 1 puff into the lungs 2 (two) times daily. 08/03/15  Yes Historical Provider, MD  diltiazem (CARDIZEM CD) 120 MG 24 hr capsule Take 1 capsule (120 mg total) by mouth 2 (two) times daily. 07/06/15  Yes Arnoldo Lenis, MD  escitalopram (LEXAPRO) 10 MG tablet Take 10 mg by mouth every evening.    Yes Historical Provider, MD  furosemide (LASIX) 20 MG tablet Take 1 tablet (20 mg total) by mouth daily as needed for edema. Patient taking differently: Take 20 mg by mouth daily.  07/06/15  Yes Arnoldo Lenis, MD  HYDROcodone-acetaminophen (NORCO/VICODIN) 5-325 MG tablet Take 1 tablet by mouth every 4 (four) hours as needed for moderate pain (Must last 30 days.  Do not take and drive a car or use machinery.). 07/25/15  Yes Sanjuana Kava, MD  ipratropium-albuterol (DUONEB) 0.5-2.5 (3) MG/3ML SOLN Inhale 3 mLs into the lungs every 6 (six) hours as needed (for shortness of breath).  08/03/15  Yes Historical Provider, MD  meloxicam (MOBIC) 7.5 MG tablet Take 15 mg by mouth at bedtime.    Yes Historical Provider, MD  pantoprazole (PROTONIX) 40 MG tablet Take 1 tablet (40 mg total) by mouth daily. 07/19/15  Yes Reyne Dumas, MD    predniSONE (DELTASONE) 10 MG tablet 6 tablets 5 days, 5 tablets 5 days, 4 tablets 5 days, 3 tablets 5 days, 2 tablets 5 days, 1 tablet 5 days, half a tablet 5 days then discontinue Patient taking differently: Take 5-30 mg by mouth daily. Take 3 daily for 5 days, then take 2 daily for 5 days, then take 1 tablet daily for 5 days, then take 1/2 tablet daily for 5 days. 07/19/15  Yes Reyne Dumas, MD  rivaroxaban (XARELTO) 20 MG TABS tablet Take 1 tablet (20 mg total) by mouth daily with supper. 07/19/15  Yes Reyne Dumas, MD  SYMBICORT 160-4.5 MCG/ACT inhaler Inhale 1 puff into the lungs 2 (two) times daily. 07/06/15  Yes Historical Provider, MD  albuterol-ipratropium (COMBIVENT) 18-103 MCG/ACT inhaler Inhale 2 puffs into the lungs 2 (two) times daily. For shortness of breath Patient not taking: Reported on 08/08/2015 07/19/15   Reyne Dumas, MD  levofloxacin (LEVAQUIN) 750 MG tablet Take 1 tablet (750 mg total) by mouth daily. Patient not taking: Reported on 08/08/2015 07/19/15   Reyne Dumas, MD    Physical Exam: Filed Vitals:   08/08/15 2106 08/08/15 2115 08/08/15 2130 08/08/15 2145  BP:  107/87 103/61 91/56  Pulse:   95   Temp: 97.3 F (36.3 C)     TempSrc: Oral     Resp:  26 26 22   Height: 5\' 4"  (1.626 m)     Weight: 62.2 kg (137 lb 2 oz)     SpO2:   74%      General:  Appears anxiety and somewhat disheveled but is NAD Eyes:  PERRL, EOMI, normal lids, iris ENT:  grossly normal hearing, lips & tongue, mmm Neck:  no LAD, masses or thyromegaly Cardiovascular: Afib with RVR to 130s despite Cardizem drip of 7.5, no apparent m/r/g. No LE edema.  Respiratory: Tachypnea, mild diffuse wheezing, reasonable air movement Abdomen:  soft, ntnd, NABS Skin:  no rash or induration seen on limited exam Musculoskeletal:  grossly normal tone BUE/BLE, good ROM, no bony abnormality Psychiatric:  grossly normal mood and affect, speech fluent and appropriate, AOx3 Neurologic:  CN 2-12 grossly intact,  moves all extremities in coordinated fashion, sensation intact  Labs on Admission: I have personally reviewed following labs and imaging studies  CBC:  Recent Labs Lab 08/08/15 1749 08/08/15 1948  WBC 15.7* 15.1*  NEUTROABS 13.5* 12.7*  HGB 4.7* 4.4*  HCT 15.0* 13.7*  MCV 96.8 95.8  PLT 256 [id number]   Basic Metabolic Panel:  Recent Labs Lab 08/08/15 1749  NA 138  K 4.4  CL 105  CO2 22  GLUCOSE 198*  BUN 49*  CREATININE 1.02*  CALCIUM 7.7*   GFR: Estimated Creatinine Clearance: 46.2 mL/min (by C-G formula based on Cr of 1.02). Liver Function Tests: No results for input(s): AST, ALT, ALKPHOS, BILITOT, PROT, ALBUMIN in the last 168 hours. No results for input(s): LIPASE, AMYLASE in the last 168 hours. No results for input(s): AMMONIA in the last 168 hours. Coagulation Profile: No results for input(s): INR, PROTIME in the last 168 hours. Cardiac Enzymes:  Recent Labs Lab 08/08/15 1749 08/08/15 1948  TROPONINI 0.04* 0.03*   BNP (last 3 results) No results for input(s): PROBNP in the last 8760 hours. HbA1C: No results for input(s): HGBA1C in the last 72 hours. CBG: No results for input(s): GLUCAP in the last 168 hours. Lipid Profile: No results for input(s): CHOL, HDL, LDLCALC, TRIG, CHOLHDL, LDLDIRECT in the last 72 hours. Thyroid Function Tests: No results for input(s): TSH, T4TOTAL, FREET4, T3FREE, THYROIDAB in the last 72 hours. Anemia Panel: No results for input(s): VITAMINB12, FOLATE, FERRITIN, TIBC, IRON, RETICCTPCT in the last 72 hours. Urine analysis: No results found for: COLORURINE, APPEARANCEUR, LABSPEC, PHURINE, GLUCOSEU, HGBUR, BILIRUBINUR, KETONESUR, PROTEINUR, UROBILINOGEN, NITRITE, LEUKOCYTESUR  Creatinine Clearance: Estimated Creatinine Clearance: 46.2 mL/min (by C-G formula based on Cr of 1.02).  Sepsis Labs: @LABRCNTIP (procalcitonin:4,lacticidven:4) )No results found for this or any previous visit (from the past 240 hour(s)).    Radiological Exams on Admission: Dg Chest Portable 1 View  08/08/2015  CLINICAL DATA:  Tachycardia  and shortness of breath. EXAM: PORTABLE CHEST 1 VIEW COMPARISON:  07/17/2015 FINDINGS: 1801 hours. Lungs are hyperexpanded. The lungs are clear wiithout focal pneumonia, edema, pneumothorax or pleural effusion. The cardiopericardial silhouette is within normal limits for size. The visualized bony structures of the thorax are intact. Telemetry leads overlie the chest. IMPRESSION: Emphysema without acute cardiopulmonary findings. Electronically Signed   By: Misty Stanley M.D.   On: 08/08/2015 18:14    EKG: Independently reviewed. Afib with RVR in 140s, otherwise somewhat difficult to interpret but no obvious ST changes concerning for acute ischemia   Assessment/Plan Principal Problem:   Gastrointestinal hemorrhage with melena Active Problems:   Atrial fibrillation with RVR (H[facility])   Tobacco abuse   Elevated troponin   Acute blood loss anemia   Chronic obstructive pulmonary disease (COPD) ([facility])   Chest pain    GI Bleed with melena -Likely upper source and patient with lots of reasons to have a bleed - taking Mobic and steroids, ongoing tobacco use, plus on Xarelto.  Likely source is ulcer vs. AVM vs. Severe gastritis (does report epigastric burning). -Dr. Dereck Leep consulted by ER physician and will plan to scope patient in AM -Will start on Protonix drip overnight -May have clear liquids until midnight, then NPO -Given lack of hematemesis, will not do gastric lavage at this time -Would not restart Mobic at the time of discharge  Afib with RVR -2 recent hospitalizations for this.  Treated with Cardizem drip during the first admission; electrically cardioverted during the second.   -Had f/u as an outpatient with GI on 6/29 and was back in afib without RVR; there is no further plan for additional cardioversion due to medication noncompliance -Currently again in RVR -Will admit to SDU for  Cardizem drip; maintain drip for at least until after EGD, as patient may revert back until other acute issues have been addressed -Will need serious discussion regarding long-term anticoagulation - likely ASA monotherapy in at least the short-term -Cardiology consult in the AM  Chest pain -Current troponins are back to previous baseline in May (lower than during June hospitalization) -However, with report of chest pain in this patient with elevated troponin, it is difficult to definitively say that ACS is not involved (while unlikely) -Despite active GI bleeding, will give patient 81 mg ASA daily and continue while cycling troponins and rechecking EKG in AM -Cardiology consult in AM as above  Acute blood loss anemia -Hgb 4.4, previously 11->9.8.   -2 units PRBC ordered in ER  -Will check repeat CBC following transfusion and then q6h -Patient is likely to need at least 4 units, will follow closely -Patient is currently hemodynamically stable and this does not currently appear to be a life-threatening bleed.  If her condition changes overnight (she will be closely monitored in SDU), then will prescribe KCentra as per the anticoagulation reversal protocol. -Other management will depend on findings on EGD tomorrow.  COPD -She does not appear to be having an acute exacerbation; while she does have tachypnea and increased WOB, I suspect that this is instead due to severe anemia and afib with RVR -She does not describe symptoms that would merit further antibiotic treatment at this time -Will change her PO Prednisone to IV Solumedrol for now, but otherwise supportive measures with O2 and nebs -Tobacco cessation again encouraged, nicotine patch provided  DVT prophylaxis: SCDs (active bleeding) Code Status: Full - confirmed with patient/family Family Communication: Daughter, [name], [phone]; Boyfriend, [name], [phone] Disposition Plan: Home once  clinically improved Consults  called: GI; Cardiology in AM Admission status: Admit to SDU   Karmen Bongo MD [facility]  If 7PM-7AM, please contact night-coverage [email or web address] Password [email or web address]  08/08/2015, 9:58 PM

## 2015-08-08 NOTE — ED Notes (Signed)
X-ray at bedside

## 2015-08-08 NOTE — ED Notes (Signed)
Dr Daphene Calamity hat he has spoken to pt regarding blood transfusion. Consent explained to pt who signed and this RN witnessed

## 2015-08-08 NOTE — ED Notes (Signed)
Patient arrives via EMS from home with c/o tachycardia. Reports shortness of breath. Denies chest pain. HR found to be 190 by EMS. Vagal maneuvers attempted with temporary success. Patient alert/oriented.

## 2015-08-08 NOTE — ED Notes (Signed)
Fecal occult blood specimen obtained from pt by this RN. Return of black, tarry stool that is heme positive

## 2015-08-08 NOTE — ED Notes (Signed)
Reported critical trop 0.04 to ED MD and primary nurse for pt

## 2015-08-08 NOTE — ED Notes (Addendum)
CRITICAL VALUE ALERT  Critical value received:  Hgb 4.7  Date of notification: 08/08/2015  Time of notification:  [id number]  Critical value read back: Yes  Nurse who received alert:  Hinton Rao, RN   MD Notified: Dr. Alvino Chapel

## 2015-08-08 NOTE — ED Provider Notes (Signed)
CSN: [id number]     Arrival date & time 08/08/15  1732 History   First MD Initiated Contact with Patient 08/08/15 1738     Chief Complaint  Patient presents with  . Tachycardia     The history is provided by the patient.  Patient resents with shortness of breath for the last few days. States she just feels more fatigued. Some slight chest tightness. History of atrial fibrillation. Recently seen by cardiology for her atrial fibrillation. She is on anticoagulation states she's been taking her medicine. She did have some noncompliance with her other medications and had been off her Lasix and her breathing treatments. She had put on 10 pounds that she states she's taken off. She is now in atrial fibrillation with RVR. She's had a little bit of cough states her legs are less swollen than they were. States if we cannot get her feeling better she may need that thing done with a get her out of the rhythm again. Reviewing the notes it appears there are not plans for another cardioversion due to her noncompliance.   she states she cannot feel her heart going fast.  Past Medical History  Diagnosis Date  . COPD (chronic obstructive pulmonary disease) ([place])     wears home o2 prn  . Fibromyalgia   . Hypertension   . Chronic right hip pain   . Atrial fibrillation ([place])   . Anginal pain ([place])     thought she was having   heartburn  . Heart murmur     8-  65 years old  . Asthma   . GERD (gastroesophageal reflux disease)     use to have it but not now  . Arthritis     bursitis , fibromyalgia  . Pneumothorax    Past Surgical History  Procedure Laterality Date  . Neck surgery    . Tonsillectomy    . Tubal ligation    . Cardioversion N/A 07/18/2015    Procedure: CARDIOVERSION;  Surgeon: Josue Hector, MD;  Location: [facility];  Service: Cardiovascular;  Laterality: N/A;   Family History  Problem Relation Age of Onset  . COPD Mother   . Diabetes Father   . Stroke Father    Social History   Substance Use Topics  . Smoking status: Current Every Day Smoker -- 0.25 packs/day for 6 years    Types: Cigarettes  . Smokeless tobacco: None     Comment: quit after 2 cigarettes yesterday (7/4)  . Alcohol Use: No     Comment: quit 30 years ago   OB History    No data available     Review of Systems  Constitutional: Positive for fatigue. Negative for activity change and appetite change.  HENT: Positive for congestion.   Eyes: Negative for pain.  Respiratory: Positive for choking and shortness of breath. Negative for chest tightness.   Cardiovascular: Positive for palpitations. Negative for chest pain and leg swelling.  Gastrointestinal: Negative for nausea, vomiting, abdominal pain and diarrhea.  Genitourinary: Negative for flank pain.  Musculoskeletal: Negative for back pain and neck stiffness.  Skin: Negative for rash.  Neurological: Negative for weakness, numbness and headaches.  Psychiatric/Behavioral: Negative for behavioral problems.      Allergies  Ibuprofen; Penicillins; Tiotropium bromide monohydrate; Tramadol; and Vicodin  Home Medications   Prior to Admission medications   Medication Sig Start Date End Date Taking? Authorizing Provider  albuterol (PROVENTIL) (2.5 MG/3ML) 0.083% nebulizer solution Take 3 mLs (2.5 mg total)  by nebulization every 6 (six) hours as needed for wheezing or shortness of breath. 07/19/15  Yes Reyne Dumas, MD  albuterol (VENTOLIN HFA) 108 (90 BASE) MCG/ACT inhaler Inhale 2 puffs into the lungs 2 (two) times daily. For shortness of breath   Yes Historical Provider, MD  COMBIVENT RESPIMAT 20-100 MCG/ACT AERS respimat Inhale 1 puff into the lungs 2 (two) times daily. 08/03/15  Yes Historical Provider, MD  diltiazem (CARDIZEM CD) 120 MG 24 hr capsule Take 1 capsule (120 mg total) by mouth 2 (two) times daily. 07/06/15  Yes Arnoldo Lenis, MD  escitalopram (LEXAPRO) 10 MG tablet Take 10 mg by mouth every evening.    Yes Historical Provider, MD   furosemide (LASIX) 20 MG tablet Take 1 tablet (20 mg total) by mouth daily as needed for edema. Patient taking differently: Take 20 mg by mouth daily.  07/06/15  Yes Arnoldo Lenis, MD  HYDROcodone-acetaminophen (NORCO/VICODIN) 5-325 MG tablet Take 1 tablet by mouth every 4 (four) hours as needed for moderate pain (Must last 30 days.  Do not take and drive a car or use machinery.). 07/25/15  Yes Sanjuana Kava, MD  ipratropium-albuterol (DUONEB) 0.5-2.5 (3) MG/3ML SOLN Inhale 3 mLs into the lungs every 6 (six) hours as needed (for shortness of breath).  08/03/15  Yes Historical Provider, MD  pantoprazole (PROTONIX) 40 MG tablet Take 1 tablet (40 mg total) by mouth daily. 07/19/15  Yes Reyne Dumas, MD  predniSONE (DELTASONE) 10 MG tablet 6 tablets 5 days, 5 tablets 5 days, 4 tablets 5 days, 3 tablets 5 days, 2 tablets 5 days, 1 tablet 5 days, half a tablet 5 days then discontinue Patient taking differently: Take 5-30 mg by mouth daily. Take 3 daily for 5 days, then take 2 daily for 5 days, then take 1 tablet daily for 5 days, then take 1/2 tablet daily for 5 days. 07/19/15  Yes Reyne Dumas, MD  rivaroxaban (XARELTO) 20 MG TABS tablet Take 1 tablet (20 mg total) by mouth daily with supper. 07/19/15  Yes Reyne Dumas, MD  SYMBICORT 160-4.5 MCG/ACT inhaler Inhale 1 puff into the lungs 2 (two) times daily. 07/06/15  Yes Historical Provider, MD   BP 120/49 mmHg  Pulse 105  Temp(Src) 97.3 F (36.3 C) (Oral)  Resp 22  Ht 5\' 4"  (1.626 m)  Wt 137 lb 2 oz (62.2 kg)  BMI 23.53 kg/m2  SpO2 91% Physical Exam  Constitutional: She appears well-nourished.  HENT:  Head: Normocephalic and atraumatic.  Neck: Neck supple.  Cardiovascular:  Irregular tachycardia.  Pulmonary/Chest:  Wheezes on right side. Mild tachypnea.  Abdominal: There is no tenderness.  Musculoskeletal: She exhibits edema.  Edema to bilateral lower extremities, but worse on left  Neurological: She is alert.  Skin: Skin is warm.     ED Course  Procedures (including critical care time) Labs Review Labs Reviewed  BASIC METABOLIC PANEL - Abnormal; Notable for the following:    Glucose, Bld 198 (*)    BUN 49 (*)    Creatinine, Ser 1.02 (*)    Calcium 7.7 (*)    GFR calc non Af Amer 56 (*)    All other components within normal limits  CBC WITH DIFFERENTIAL/PLATELET - Abnormal; Notable for the following:    WBC 15.7 (*)    RBC 1.55 (*)    Hemoglobin 4.7 (*)    HCT 15.0 (*)    RDW 16.8 (*)    Neutro Abs 13.5 (*)    All other  components within normal limits  TROPONIN I - Abnormal; Notable for the following:    Troponin I 0.04 (*)    All other components within normal limits  TROPONIN I - Abnormal; Notable for the following:    Troponin I 0.03 (*)    All other components within normal limits  CBC WITH DIFFERENTIAL/PLATELET - Abnormal; Notable for the following:    WBC 15.1 (*)    RBC 1.43 (*)    Hemoglobin 4.4 (*)    HCT 13.7 (*)    RDW 16.8 (*)    Neutro Abs 12.7 (*)    All other components within normal limits  MRSA PCR SCREENING  TROPONIN I  TROPONIN I  CBC WITH DIFFERENTIAL/PLATELET  CBC WITH DIFFERENTIAL/PLATELET  BASIC METABOLIC PANEL  POC OCCULT BLOOD, ED  TYPE AND SCREEN  PREPARE RBC (CROSSMATCH)  ABO/RH    Imaging Review Dg Chest Portable 1 View  08/08/2015  CLINICAL DATA:  Tachycardia and shortness of breath. EXAM: PORTABLE CHEST 1 VIEW COMPARISON:  07/17/2015 FINDINGS: 1801 hours. Lungs are hyperexpanded. The lungs are clear wiithout focal pneumonia, edema, pneumothorax or pleural effusion. The cardiopericardial silhouette is within normal limits for size. The visualized bony structures of the thorax are intact. Telemetry leads overlie the chest. IMPRESSION: Emphysema without acute cardiopulmonary findings. Electronically Signed   By: Misty Stanley M.D.   On: 08/08/2015 18:14   I have personally reviewed and evaluated these images and lab results as part of my medical decision-making.    EKG Interpretation None      MDM   Final diagnoses:  Paroxysmal atrial fibrillation (HCC)  Upper GI bleed  Anemia, unspecified anemia type    Patient presented paroxysmal A. fib. Found to be in A. fib with RVR. She is oriented on anticoagulation. Found to have anemia likely due to upper GI bleed since she is having melena. Hemoglobin under 5. Discussed with GI who will likely scope the patient tomorrow. Will transfuse patient. Admit to stepdown. Patient had been on Cardizem drip.  CRITICAL CARE Performed by: Mackie Pai Total critical care time:30 minutes Critical care time was exclusive of separately billable procedures and treating other patients. Critical care was necessary to treat or prevent imminent or life-threatening deterioration. Critical care was time spent personally by me on the following activities: development of treatment plan with patient and/or surrogate as well as nursing, discussions with consultants, evaluation of patient's response to treatment, examination of patient, obtaining history from patient or surrogate, ordering and performing treatments and interventions, ordering and review of laboratory studies, ordering and review of radiographic studies, pulse oximetry and re-evaluation of patient's condition.     Davonna Belling, MD 08/08/15 2156

## 2015-08-08 NOTE — ED Notes (Signed)
hospitalist in to assess, consent signed

## 2015-08-09 ENCOUNTER — Encounter (HOSPITAL_COMMUNITY): Payer: Self-pay | Admitting: *Deleted

## 2015-08-09 ENCOUNTER — Encounter (HOSPITAL_COMMUNITY)
Admission: EM | Disposition: A | Discharge status: Home Health | Payer: Self-pay | Source: Home / Self Care | Attending: Family Medicine

## 2015-08-09 DIAGNOSIS — K921 Melena: Secondary | ICD-10-CM

## 2015-08-09 DIAGNOSIS — K3189 Other diseases of stomach and duodenum: Secondary | ICD-10-CM

## 2015-08-09 DIAGNOSIS — R000 Tachycardia, unspecified: Secondary | ICD-10-CM

## 2015-08-09 DIAGNOSIS — K259 Gastric ulcer, unspecified as acute or chronic, without hemorrhage or perforation: Secondary | ICD-10-CM

## 2015-08-09 DIAGNOSIS — K274 Chronic or unspecified peptic ulcer, site unspecified, with hemorrhage: Secondary | ICD-10-CM | POA: Diagnosis not present

## 2015-08-09 DIAGNOSIS — D62 Acute posthemorrhagic anemia: Secondary | ICD-10-CM

## 2015-08-09 DIAGNOSIS — K228 Other specified diseases of esophagus: Secondary | ICD-10-CM

## 2015-08-09 DIAGNOSIS — K449 Diaphragmatic hernia without obstruction or gangrene: Secondary | ICD-10-CM

## 2015-08-09 DIAGNOSIS — J449 Chronic obstructive pulmonary disease, unspecified: Secondary | ICD-10-CM

## 2015-08-09 DIAGNOSIS — I4891 Unspecified atrial fibrillation: Secondary | ICD-10-CM

## 2015-08-09 DIAGNOSIS — Z720 Tobacco use: Secondary | ICD-10-CM

## 2015-08-09 HISTORY — PX: ESOPHAGOGASTRODUODENOSCOPY: SHX5428

## 2015-08-09 LAB — BASIC METABOLIC PANEL
Anion gap: 8 (ref 5–15)
BUN: 46 mg/dL — ABNORMAL HIGH (ref 6–20)
CALCIUM: 7.5 mg/dL — AB (ref 8.9–10.3)
CO2: 25 mmol/L (ref 22–32)
CREATININE: 1.04 mg/dL — AB (ref 0.44–1.00)
Chloride: 102 mmol/L (ref 101–111)
GFR calc Af Amer: >60 mL/min (ref >60)
GFR, EST NON AFRICAN AMERICAN: 54 mL/min — AB (ref >60)
Glucose, Bld: 144 mg/dL — ABNORMAL HIGH (ref 65–99)
Potassium: 3.8 mmol/L (ref 3.5–5.1)
Sodium: 135 mmol/L (ref 135–145)

## 2015-08-09 LAB — CBC WITH DIFFERENTIAL/PLATELET
BASOS ABS: 0 10*3/uL (ref 0.0–0.1)
BASOS ABS: 0 10*3/uL (ref 0.0–0.1)
BASOS PCT: 0 %
Basophils Absolute: 0 10*3/uL (ref 0.0–0.1)
Basophils Absolute: 0 10*3/uL (ref 0.0–0.1)
Basophils Relative: 0 %
Basophils Relative: 0 %
Basophils Relative: 0 %
EOS ABS: 0 10*3/uL (ref 0.0–0.7)
EOS PCT: 0 %
Eosinophils Absolute: 0 10*3/uL (ref 0.0–0.7)
Eosinophils Absolute: 0 10*3/uL (ref 0.0–0.7)
Eosinophils Absolute: 0 10*3/uL (ref 0.0–0.7)
Eosinophils Relative: 0 %
Eosinophils Relative: 0 %
Eosinophils Relative: 0 %
HEMATOCRIT: 24.3 % — AB (ref 36.0–46.0)
HEMATOCRIT: 22.2 % — AB (ref 36.0–46.0)
HEMATOCRIT: 28.1 % — AB (ref 36.0–46.0)
HEMATOCRIT: 28.2 % — AB (ref 36.0–46.0)
HEMOGLOBIN: 9.7 g/dL — AB (ref 12.0–15.0)
Hemoglobin: 8.5 g/dL — ABNORMAL LOW (ref 12.0–15.0)
Hemoglobin: 7.7 g/dL — ABNORMAL LOW (ref 12.0–15.0)
Hemoglobin: 9.9 g/dL — ABNORMAL LOW (ref 12.0–15.0)
LYMPHS ABS: 1.3 10*3/uL (ref 0.7–4.0)
LYMPHS ABS: 0.9 10*3/uL (ref 0.7–4.0)
LYMPHS PCT: 8 %
LYMPHS PCT: 5 %
Lymphocytes Relative: 9 %
Lymphocytes Relative: 6 %
Lymphs Abs: 1.3 10*3/uL (ref 0.7–4.0)
Lymphs Abs: 0.9 10*3/uL (ref 0.7–4.0)
MCH: 30.4 pg (ref 26.0–34.0)
MCH: 30.2 pg (ref 26.0–34.0)
MCH: 29.9 pg (ref 26.0–34.0)
MCH: 30.4 pg (ref 26.0–34.0)
MCHC: 35 g/dL (ref 30.0–36.0)
MCHC: 34.7 g/dL (ref 30.0–36.0)
MCHC: 34.5 g/dL (ref 30.0–36.0)
MCHC: 35.1 g/dL (ref 30.0–36.0)
MCV: 86.8 fL (ref 78.0–100.0)
MCV: 87.1 fL (ref 78.0–100.0)
MCV: 86.7 fL (ref 78.0–100.0)
MCV: 86.5 fL (ref 78.0–100.0)
MONO ABS: 0.3 10*3/uL (ref 0.1–1.0)
MONO ABS: 0.6 10*3/uL (ref 0.1–1.0)
MONO ABS: 0.6 10*3/uL (ref 0.1–1.0)
MONOS PCT: 2 %
MONOS PCT: 4 %
MONOS PCT: 4 %
Monocytes Absolute: 0.6 10*3/uL (ref 0.1–1.0)
Monocytes Relative: 4 %
NEUTROS ABS: 13.9 10*3/uL — AB (ref 1.7–7.7)
NEUTROS ABS: 14.1 10*3/uL — AB (ref 1.7–7.7)
NEUTROS ABS: 14.5 10*3/uL — AB (ref 1.7–7.7)
NEUTROS PCT: 89 %
NEUTROS PCT: 90 %
Neutro Abs: 15.1 10*3/uL — ABNORMAL HIGH (ref 1.7–7.7)
Neutrophils Relative %: 88 %
Neutrophils Relative %: 91 %
Platelets: 179 10*3/uL (ref 150–400)
Platelets: 175 10*3/uL (ref 150–400)
Platelets: 162 10*3/uL (ref 150–400)
Platelets: 164 10*3/uL (ref 150–400)
RBC: 2.8 MIL/uL — ABNORMAL LOW (ref 3.87–5.11)
RBC: 2.55 MIL/uL — ABNORMAL LOW (ref 3.87–5.11)
RBC: 3.24 MIL/uL — ABNORMAL LOW (ref 3.87–5.11)
RBC: 3.26 MIL/uL — ABNORMAL LOW (ref 3.87–5.11)
RDW: 15.9 % — ABNORMAL HIGH (ref 11.5–15.5)
RDW: 16.6 % — AB (ref 11.5–15.5)
RDW: 16 % — ABNORMAL HIGH (ref 11.5–15.5)
RDW: 16.2 % — AB (ref 11.5–15.5)
WBC: 15.5 10*3/uL — ABNORMAL HIGH (ref 4.0–10.5)
WBC: 16 10*3/uL — ABNORMAL HIGH (ref 4.0–10.5)
WBC: 16 10*3/uL — ABNORMAL HIGH (ref 4.0–10.5)
WBC: 16.7 10*3/uL — ABNORMAL HIGH (ref 4.0–10.5)

## 2015-08-09 LAB — TROPONIN I
Troponin I: 0.04 ng/mL (ref <0.03)
Troponin I: 0.03 ng/mL (ref <0.03)

## 2015-08-09 LAB — PREPARE RBC (CROSSMATCH)

## 2015-08-09 LAB — MAGNESIUM: MAGNESIUM: 1.7 mg/dL (ref 1.7–2.4)

## 2015-08-09 LAB — OCCULT BLOOD, POC DEVICE: Fecal Occult Bld: POSITIVE — AB

## 2015-08-09 SURGERY — EGD (ESOPHAGOGASTRODUODENOSCOPY)
Anesthesia: Moderate Sedation

## 2015-08-09 MED ORDER — MEPERIDINE HCL 50 MG/ML IJ SOLN
INTRAMUSCULAR | Status: DC | PRN
  Administered 2015-08-09 (×2): 25 mg via INTRAVENOUS

## 2015-08-09 MED ORDER — SODIUM CHLORIDE 0.9 % IV SOLN
INTRAVENOUS | Status: DC
  Administered 2015-08-09: 13:00:00 via INTRAVENOUS

## 2015-08-09 MED ORDER — BUTAMBEN-TETRACAINE-BENZOCAINE 2-2-14 % EX AERO
INHALATION_SPRAY | CUTANEOUS | Status: AC
  Filled 2015-08-09: qty 20

## 2015-08-09 MED ORDER — MEPERIDINE HCL 50 MG/ML IJ SOLN
INTRAMUSCULAR | Status: AC
  Filled 2015-08-09: qty 1

## 2015-08-09 MED ORDER — MIDAZOLAM HCL 5 MG/5ML IJ SOLN
INTRAMUSCULAR | Status: AC
  Filled 2015-08-09: qty 10

## 2015-08-09 MED ORDER — DIPHENHYDRAMINE HCL 25 MG PO CAPS
25.0000 mg | ORAL_CAPSULE | Freq: Once | ORAL | Status: AC
  Administered 2015-08-09: 25 mg via ORAL
  Filled 2015-08-09: qty 1

## 2015-08-09 MED ORDER — SODIUM CHLORIDE 0.9 % IV SOLN
Freq: Once | INTRAVENOUS | Status: AC
  Administered 2015-08-09: 13:00:00 via INTRAVENOUS

## 2015-08-09 MED ORDER — MIDAZOLAM HCL 5 MG/5ML IJ SOLN
INTRAMUSCULAR | Status: DC | PRN
  Administered 2015-08-09: 1 mg via INTRAVENOUS
  Administered 2015-08-09: 2 mg via INTRAVENOUS

## 2015-08-09 MED ORDER — SIMETHICONE 40 MG/0.6ML PO SUSP
ORAL | Status: AC
  Filled 2015-08-09: qty 30

## 2015-08-09 MED ORDER — BUTAMBEN-TETRACAINE-BENZOCAINE 2-2-14 % EX AERO
INHALATION_SPRAY | CUTANEOUS | Status: DC | PRN
  Administered 2015-08-09: 2 via TOPICAL

## 2015-08-09 MED ORDER — FUROSEMIDE 10 MG/ML IJ SOLN
20.0000 mg | Freq: Once | INTRAMUSCULAR | Status: AC
  Administered 2015-08-09: 20 mg via INTRAVENOUS
  Filled 2015-08-09: qty 2

## 2015-08-09 MED ORDER — STERILE WATER FOR IRRIGATION IR SOLN
Status: DC | PRN
  Administered 2015-08-09: 12:00:00

## 2015-08-09 MED ORDER — SUCRALFATE 1 GM/10ML PO SUSP
1.0000 g | Freq: Three times a day (TID) | ORAL | Status: DC
Start: 2015-08-09 — End: 2015-08-12
  Administered 2015-08-09 – 2015-08-12 (×12): 1 g via ORAL
  Filled 2015-08-09 (×14): qty 10

## 2015-08-09 MED ORDER — ACETAMINOPHEN 325 MG PO TABS
650.0000 mg | ORAL_TABLET | Freq: Once | ORAL | Status: AC
  Administered 2015-08-09: 650 mg via ORAL
  Filled 2015-08-09: qty 2

## 2015-08-09 NOTE — Progress Notes (Signed)
Patient's hemoglobin increased to 8.5 following the 2 units PRBC given.  This increase is greater than would be expected and so may be lower with the next blood draw once it equilibrates.  For now, no additional blood products.  Additionally, her troponin is back at 0.04 which is the same as the initial check (and slightly more than the last check).  No intervention is needed at this time.  Carlyon Shadow, M.D.

## 2015-08-09 NOTE — Progress Notes (Signed)
PROGRESS NOTE  Lindsay Salazar [id number] DOB: 29-Jul-1950 DOA: 08/08/2015 PCP: [name], PA-C  Brief Narrative: 65 year old woman with history of atrial fibrillation and recent hospitalizations, on anticoagulation, presented with chest pain, syncope, cough, fatigue, dark stools. Admitted for GI bleed, profound acute blood loss anemia, atrial fibrillation with rapid ventricular response.  Assessment/Plan: 1. Upper GI bleed in the context of anticoagulation, recent steroid use, NSAID. GI consulted. Continue Protonix infusion.  2. Profound Acute blood loss anemia. Secondary to upper GI bleed. Currently hemodynamically stable. Hgb noted to be as low as 4.4, but has improved s/p transfusion 2 units pRBCs.  3. Afib with RVR. Patient has had 2 recent hospitalizations for this, was converted with Cardizem the first time, and electrically cardioverted the second time. Currently heart rate controlled. CHADS2Vasc score 3 4. Chest pain, resolved. Troponins flat, no evidence of ACS. Minimal troponin elevation secondary to demand ischemia. 5. COPD, chronic hypoxic respiratory failure on home oxygen. Appears stable. Stop steroids given clear lung exam and GIB. 6. Tobacco use disorder in early remission   Hemodynamics are stable, bleeding appears to have stopped. Hemoglobin has improved with transfusion. Hemoglobin slightly lower likely reflecting equilibration.   Follow up EGD and GI recs. Continue PPI. Continue to monitor Hgb and transfuse as necessary.  Continue Cardizem per cardiology.  DVT prophylaxis: SCDs Code Status: Full Family Communication: discussed with patient. No family present at bedside. Disposition Plan: Discharge home once improved  [name], MD  [facility] Direct contact: [phone] --[place]  --[email or web address]; password [email or web address]  7PM-7AM contact night coverage as above 08/09/2015, 5:39 AM  LOS: 1 day   Consultants:  Cardiology  GI    Procedures:  EGD, follow up results  7/5 1 unit PRBC  7/6 1 unit PRBC  Antimicrobials:  none  HPI/Subjective: Feels improved from yesterday. Reports pain in right hip due to chronic bursitis. Breathing well. Reports an episode of nausea yesterday and some abd pain today. Has not had a BM since yesterday.   Objective: Filed Vitals:   08/09/15 0315 08/09/15 0330 08/09/15 0345 08/09/15 0400  BP: 139/88 130/82 149/71 137/82  Pulse: 86 82 75 87  Temp:    97.9 F (36.6 C)  TempSrc:    Oral  Resp: 28 27 32 23  Height:      Weight:      SpO2: 100% 100% 100% 100%    Intake/Output Summary (Last 24 hours) at 08/09/15 0539 Last data filed at 08/09/15 0400  Gross per 24 hour  Intake   2105 ml  Output    250 ml  Net   1855 ml     Filed Weights   08/08/15 2106  Weight: 62.2 kg (137 lb 2 oz)    Exam: Constitutional:  . Appears calm and comfortable Eyes:  . PERRL and irises appear normal . Conjunctivae and lids appear normal ENMT:  . external ears, nose appear normal . grossly normal hearing  . Lips appear normal; teeth normal, gums normal Neck:  . neck appears normal, no masses, normal ROM, supple . no thyromegaly Respiratory:  . CTA bilaterally, no w/r/r.  . Respiratory effort normal. No retractions or accessory muscle use Cardiovascular:  . irregular, no m/r/g . 1+ bilateral LE extremity edema   Abdomen:  . Abdomen appears normal; no tenderness or masses . No hernias noted Musculoskeletal:  o Moves all extremities Psychiatric:  . judgement and insight appear normal . Mental status o Mood, affect appropriate  I  have personally reviewed following labs and imaging studies:  Cr 1.04  Troponin 0.04.  WBC 15.5  Hgb 8.5, improving  Glucose 144  Scheduled Meds: . aspirin EC  81 mg Oral Daily  . escitalopram  10 mg Oral QPM  . ipratropium-albuterol  3 mL Inhalation Q6H  . methylPREDNISolone (SOLU-MEDROL) injection  40 mg Intravenous Q12H  .  nicotine  14 mg Transdermal Daily  . [START ON 08/12/2015] pantoprazole  40 mg Intravenous Q12H  . pneumococcal 23 valent vaccine  0.5 mL Intramuscular Tomorrow-1000  . sodium chloride flush  3 mL Intravenous Q12H   Continuous Infusions: . diltiazem (CARDIZEM) infusion 10 mg/hr (08/09/15 0400)  . pantoprozole (PROTONIX) infusion 8 mg/hr (08/09/15 0400)    Principal Problem:   Gastrointestinal hemorrhage with melena Active Problems:   Atrial fibrillation with RVR (HCC)   Tobacco abuse   Elevated troponin   Acute blood loss anemia   Chronic obstructive pulmonary disease (COPD) (HCC)   Chest pain   LOS: 1 day   Time spent 25 minutes  By signing my name below, I, Delene Ruffini, attest that this documentation has been prepared under the direction and in the presence of Aneliese Beaudry P. Sarajane Jews, MD. Electronically Signed: Delene Ruffini, Scribe.  08/09/2015 8:55am  I personally performed the services described in this documentation. All medical record entries made by the scribe were at my direction. I have reviewed the chart and agree that the record reflects my personal performance and is accurate and complete. [name], MD

## 2015-08-09 NOTE — Consult Note (Signed)
CARDIOLOGY CONSULT NOTE   Patient ID: Lindsay Salazar MRN: [id number] DOB/AGE: 10-31-1950 65 y.o.  Admit Date: 08/08/2015 Referring Physician: TRH-Goodrich MD Primary Physician: [name], PA-C Consulting Cardiologist: [name] MD Primary Cardiologist Rozann Lesches MD Reason for Consultation: Atrial fibrillation   Clinical Summary Lindsay Salazar is a 65 y.o.female with known history of atrial fib/flutter CHADS VASc Score 3 on Xarelto,  who had DCCV for atrial fib by Dr. Johnsie Cancel on 07/18/2015, temporarily converted her to NSR, but on follow up appointment on 08/02/2015, she had returned to rate atrial fib. Other history includes COPD with ongoing tobacco abuse.   She presented to the ER after forgetting to take her cardiac medications, and upon remembering she took it and then sleep due to profound fatigue and felt her tongue go numb. She felt left sided chest pain under her breast. She came to ER at the urging of her boyfriend. She admits to having melena for 3 days but did not seek treatment until she became so weak.   On arrival to ER, BP 122/98, HR 146, O2 Sat 94% afebrile. EkG demonstrated atrial fib with rate of 141 bpm. Pertinent labs revealed profound anemia with Hgb of 4.4, Hct 13.7. WBC 15.1. PLTs 16.8. Creatinine 1.02. Troponin 0,04; 0.03. 0.04,  CXR demonstrated emphysema without acute cardiopulmonary disease. She was started on diltiazem gtt and given IV fluid support. GI has been consulted. She has been transfused 2 units of blood with improvement of Hgb to 8.5 this am.  Xarelto is on hold.   She is now NPO for colonoscopy at noon. She is breathing and feeling better.    Allergies  Allergen Reactions  . Ibuprofen Nausea And Vomiting  . Penicillins Swelling    Has patient had a PCN reaction causing immediate rash, facial/tongue/throat swelling, SOB or lightheadedness with hypotension: Yes Has patient had a PCN reaction causing severe rash involving mucus membranes or  skin necrosis: No Has patient had a PCN reaction that required hospitalization No Has patient had a PCN reaction occurring within the last 10 years: No If all of the above answers are "NO", then may proceed with Cephalosporin use.   . Tiotropium Bromide Monohydrate Itching  . Tramadol Nausea And Vomiting  . Vicodin [Hydrocodone-Acetaminophen] Nausea And Vomiting    Medications Scheduled Medications: . aspirin EC  81 mg Oral Daily  . escitalopram  10 mg Oral QPM  . ipratropium-albuterol  3 mL Inhalation Q6H  . methylPREDNISolone (SOLU-MEDROL) injection  40 mg Intravenous Q12H  . nicotine  14 mg Transdermal Daily  . [START ON 08/12/2015] pantoprazole  40 mg Intravenous Q12H  . pneumococcal 23 valent vaccine  0.5 mL Intramuscular Tomorrow-1000  . sodium chloride flush  3 mL Intravenous Q12H    Infusions: . diltiazem (CARDIZEM) infusion 10 mg/hr (08/09/15 0600)  . pantoprozole (PROTONIX) infusion 8 mg/hr (08/09/15 0600)    PRN Medications: sodium chloride, acetaminophen, albuterol, ALPRAZolam, morphine injection, ondansetron (ZOFRAN) IV, sodium chloride flush, zolpidem   Past Medical History  Diagnosis Date  . COPD (chronic obstructive pulmonary disease) ([place])     wears home o2 prn  . Fibromyalgia   . Hypertension   . Chronic right hip pain   . Atrial fibrillation ([place])   . Anginal pain ([place])     thought she was having   heartburn  . Heart murmur     58-  21 years old  . Asthma   . GERD (gastroesophageal reflux disease)  use to have it but not now  . Arthritis     bursitis , fibromyalgia  . Pneumothorax     Past Surgical History  Procedure Laterality Date  . Neck surgery    . Tonsillectomy    . Tubal ligation    . Cardioversion N/A 07/18/2015    Procedure: CARDIOVERSION;  Surgeon: Josue Hector, MD;  Location: [facility];  Service: Cardiovascular;  Laterality: N/A;    Family History  Problem Relation Age of Onset  . COPD Mother   . Diabetes Father   .  Stroke Father      Social History Lindsay Salazar reports that she has been smoking Cigarettes.  She has a 1.5 pack-year smoking history. She does not have any smokeless tobacco history on file. Lindsay Salazar reports that she does not drink alcohol.  Review of Systems Complete review of systems are found to be negative unless outlined in H&P above.  Physical Examination Blood pressure 121/72, pulse 74, temperature 97.6 F (36.4 C), temperature source Oral, resp. rate 17, height 5\' 4"  (1.626 m), weight 137 lb 2 oz (62.2 kg), SpO2 100 %.  Intake/Output Summary (Last 24 hours) at 08/09/15 0839 Last data filed at 08/09/15 0600  Gross per 24 hour  Intake   2175 ml  Output    675 ml  Net   1500 ml    Telemetry: Atrial fib rates in the low 100;s   GEN: No acute distress complaining of abdominal soreness in the middle quadrant.  HEENT: Conjunctiva and lids normal, oropharynx clear with moist mucosa. Neck: Supple, no elevated JVP or carotid bruits, no thyromegaly. Lungs: Some inspiratory wheezes.  Cardiac: Iregular rate and rhythm, tachycardic, no S3 or significant systolic murmur, no pericardial rub. Abdomen: Soft, nontender, no hepatomegaly, bowel sounds present, no guarding or rebound. Extremities: 1+ pitting edema at the ankles, distal pulses 2+. Skin: Warm and dry. Musculoskeletal: No kyphosis. Neuropsychiatric: Alert and oriented x3, affect grossly appropriate.  Prior Cardiac Testing/Procedures 1. DCCV 07/18/2015  2. Echocardiogram Left ventricle: The cavity size was normal. Wall thickness was  normal. Systolic function was normal. The estimated ejection  fraction was 55%. There is akinesis of the midanteroseptal  myocardium. The study is not technically sufficient to allow  evaluation of LV diastolic function. - Aortic valve: Mildly calcified annulus. Trileaflet. There was  trivial regurgitation. - Mitral valve: Mildly thickened leaflets . There was mild  regurgitation. -  Left atrium: The atrium was mildly dilated. - Right ventricle: Systolic function was low normal. - Right atrium: The atrium was mildly dilated. Central venous  pressure (est): 8 mm Hg. - Tricuspid valve: There was moderate regurgitation. - Pulmonary arteries: PA peak pressure: 41 mm Hg (S). - Pericardium, extracardiac: There was no pericardial effusion.  Lab Results  Basic Metabolic Panel:  Recent Labs Lab 08/08/15 1749 08/09/15 0316  NA 138 135  K 4.4 3.8  CL 105 102  CO2 22 25  GLUCOSE 198* 144*  BUN 49* 46*  CREATININE 1.02* 1.04*  CALCIUM 7.7* 7.5*    CBC:  Recent Labs Lab 08/08/15 1749 08/08/15 1948 08/09/15 0316  WBC 15.7* 15.1* 15.5*  NEUTROABS 13.5* 12.7* 13.9*  HGB 4.7* 4.4* 8.5*  HCT 15.0* 13.7* 24.3*  MCV 96.8 95.8 86.8  PLT 256 219 179    Cardiac Enzymes:  Recent Labs Lab 08/08/15 1749 08/08/15 1948 08/09/15 0316  TROPONINI 0.04* 0.03* 0.04*     Radiology: Dg Chest Portable 1 View  08/08/2015  CLINICAL  DATA:  Tachycardia and shortness of breath. EXAM: PORTABLE CHEST 1 VIEW COMPARISON:  07/17/2015 FINDINGS: 1801 hours. Lungs are hyperexpanded. The lungs are clear wiithout focal pneumonia, edema, pneumothorax or pleural effusion. The cardiopericardial silhouette is within normal limits for size. The visualized bony structures of the thorax are intact. Telemetry leads overlie the chest. IMPRESSION: Emphysema without acute cardiopulmonary findings. Electronically Signed   By: Misty Stanley M.D.   On: 08/08/2015 18:14     ECG: Atrial fib with rate of 141 bpm. With diffuse ST depression.    Impression and Recommendations  1. Tachycardia: Multifactorial in the setting of profound anemia with GIB. History of previosu afib, current EKGs suggests MAT.  Agree with stopping her Xarelto for now to wait for colonoscopy today for source of bleeding. Currently on IV diltiazem at 5 mg for rate control. BP is stable Elevated troponin likely from demand  ischemia in the setting of rapid HR and anemia.   2. GIB: Melena for 3 days prior to admission. Defer to GI for work-up. Hgb improved with 2 blood transfusions.   3. COPD: Ongoing tobacco abuse with emphysema per CXR. Smoking cessation discussed.   4. Hypertension: BP is stable despite anemia on diltiazem gtt.     Signed: Phill Myron. Lawrence NP [place]  08/09/2015, 8:39 AM Co-Sign MD  Patient seen and discussed with NP Lawerence, I agree with her documenation above. 65 yo female with fairly recent diagnosis of aflutter/afib, s/p DCCV 07/18/15 however at clinic f/u 08/02/15 was back in afib. She has had medication noncompliance which has complicated the management of both her afib and COPD. Admitted with GI bleed and afib with RVR, she was on xarelto at home and was also taking frequent NSAIDs, Hgb 4.7 on admission.    WBC 15.7, Hgb 4.7, Plt 256, Cr 1.02, BUN 49, trop 0.03 and flat CXR no acute process, chronic emphsymatous changes EKG MAT  Xarelto discontinued in setting of GI bleed. Last few EKGs more consistent with MAT as opposed to afib or flutter. Currently rate controlled with diltiazem, she has significant systemic driving forces for tachycardia given her anemia and COPD, hopefully as these are improved rates will trend down. Hemodynamically tolerating dilt gtt, plan would be to convert to oral probably tomorrow as things settle down. She is to remain off xarelto at this time.    Zandra Abts MD

## 2015-08-09 NOTE — Consult Note (Signed)
Reason for Consult: Melena Referring Physician: [name] is an 65 y.o. female.  HPI: Admitted yesterday thru the ED yesterday.  She tells me she could not breath yesterday and her heart was racing.  Hx of  Atrial fib and admitted x 2 in the past 2 months fast heart rate.  She is maintained on Arlo. Has been on Xarelto x 3 months. Also takes a Baby ASA 15m daily.  She noticed her stools were black and tarry this past Sunday. Denies prior hx of melena. She was admitted with a hemoglobin of 4.7. Has received 2 units of blood. Denies hematemesis.  Hemoglobin today after transfusion is 8.5.  She says her COPD is severe.  She underwent a cardioversion in June of this year but now in atrial fib.  Has not smoked cigarettes since July. Usually smokes about 2 packs of cigarettes.  (Patient has 3 elevated troponins).   CBC Latest Ref Rng 08/09/2015 08/08/2015 08/08/2015  WBC 4.0 - 10.5 K/uL 15.5(H) 15.1(H) 15.7(H)  Hemoglobin 12.0 - 15.0 g/dL 8.5(L) 4.4(LL) 4.7(LL)  Hematocrit 36.0 - 46.0 % 24.3(L) 13.7(L) 15.0(L)  Platelets 150 - 400 K/uL 179 219 256      Past Medical History  Diagnosis Date  . COPD (chronic obstructive pulmonary disease) ([place]     wears home o2 prn  . Fibromyalgia   . Hypertension   . Chronic right hip pain   . Atrial fibrillation ([facility]   . Anginal pain ([facility]     thought she was having   heartburn  . Heart murmur     563  65years old  . Asthma   . GERD (gastroesophageal reflux disease)     use to have it but not now  . Arthritis     bursitis , fibromyalgia  . Pneumothorax     Past Surgical History  Procedure Laterality Date  . Neck surgery    . Tonsillectomy    . Tubal ligation    . Cardioversion N/A 07/18/2015    Procedure: CARDIOVERSION;  Surgeon: PJosue Hector MD;  Location: [facility];  Service: Cardiovascular;  Laterality: N/A;    Family History  Problem Relation Age of Onset  . COPD Mother   . Diabetes Father   . Stroke Father      Social History:  reports that she has been smoking Cigarettes.  She has a 1.5 pack-year smoking history. She does not have any smokeless tobacco history on file. She reports that she does not drink alcohol or use illicit drugs.  Allergies:  Allergies  Allergen Reactions  . Ibuprofen Nausea And Vomiting  . Penicillins Swelling    Has patient had a PCN reaction causing immediate rash, facial/tongue/throat swelling, SOB or lightheadedness with hypotension: Yes Has patient had a PCN reaction causing severe rash involving mucus membranes or skin necrosis: No Has patient had a PCN reaction that required hospitalization No Has patient had a PCN reaction occurring within the last 10 years: No If all of the above answers are "NO", then may proceed with Cephalosporin use.   . Tiotropium Bromide Monohydrate Itching  . Tramadol Nausea And Vomiting  . Vicodin [Hydrocodone-Acetaminophen] Nausea And Vomiting    Medications: I have reviewed the patient's current medications.  Results for orders placed or performed during the hospital encounter of 08/08/15 (from the past 48 hour(s))  Basic metabolic panel     Status: Abnormal   Collection Time: 08/08/15  5:49 PM  Result Value Ref  Range   Sodium 138 135 - 145 mmol/L   Potassium 4.4 3.5 - 5.1 mmol/L   Chloride 105 101 - 111 mmol/L   CO2 22 22 - 32 mmol/L   Glucose, Bld 198 (H) 65 - 99 mg/dL   BUN 49 (H) 6 - 20 mg/dL   Creatinine, Ser 1.02 (H) 0.44 - 1.00 mg/dL   Calcium 7.7 (L) 8.9 - 10.3 mg/dL   GFR calc non Af Amer 56 (L) >60 mL/min   GFR calc Af Amer >60 >60 mL/min    Comment: (NOTE) The eGFR has been calculated using the CKD EPI equation. This calculation has not been validated in all clinical situations. eGFR's persistently <60 mL/min signify possible Chronic Kidney Disease.    Anion gap 11 5 - 15  CBC with Differential     Status: Abnormal   Collection Time: 08/08/15  5:49 PM  Result Value Ref Range   WBC 15.7 (H) 4.0 - 10.5 K/uL    RBC 1.55 (L) 3.87 - 5.11 MIL/uL   Hemoglobin 4.7 (LL) 12.0 - 15.0 g/dL    Comment: RESULT REPEATED AND VERIFIED CRITICAL RESULT CALLED TO, READ BACK BY AND VERIFIED WITH: KEMP,C AT 1900 ON 08/08/2015 BY ISLEY,B    HCT 15.0 (L) 36.0 - 46.0 %   MCV 96.8 78.0 - 100.0 fL   MCH 30.3 26.0 - 34.0 pg   MCHC 31.3 30.0 - 36.0 g/dL   RDW 16.8 (H) 11.5 - 15.5 %   Platelets 256 150 - 400 K/uL    Comment: SPECIMEN CHECKED FOR CLOTS PLATELET COUNT CONFIRMED BY SMEAR LARGE PLATELETS PRESENT    Neutrophils Relative % 86 %   Neutro Abs 13.5 (H) 1.7 - 7.7 K/uL   Lymphocytes Relative 11 %   Lymphs Abs 1.8 0.7 - 4.0 K/uL   Monocytes Relative 3 %   Monocytes Absolute 0.5 0.1 - 1.0 K/uL   Eosinophils Relative 0 %   Eosinophils Absolute 0.0 0.0 - 0.7 K/uL   Basophils Relative 0 %   Basophils Absolute 0.0 0.0 - 0.1 K/uL   RBC Morphology POLYCHROMASIA PRESENT   Troponin I     Status: Abnormal   Collection Time: 08/08/15  5:49 PM  Result Value Ref Range   Troponin I 0.04 (HH) <0.03 ng/mL    Comment: CRITICAL RESULT CALLED TO, READ BACK BY AND VERIFIED WITH: HYATT,C ON 08/08/15 AT 1920 BY LOY,C   Type and screen [facility]     Status: None (Preliminary result)   Collection Time: 08/08/15  7:48 PM  Result Value Ref Range   ABO/RH(D) O POS    Antibody Screen NEG    Sample Expiration 08/11/2015    Unit Number [id number]    Blood Component Type RBC LR PHER1    Unit division 00    Status of Unit ISSUED    Transfusion Status OK TO TRANSFUSE    Crossmatch Result Compatible    Unit Number [id number]    Blood Component Type RED CELLS,LR    Unit division 00    Status of Unit ISSUED    Transfusion Status OK TO TRANSFUSE    Crossmatch Result Compatible   Prepare RBC     Status: None   Collection Time: 08/08/15  7:48 PM  Result Value Ref Range   Order Confirmation ORDER PROCESSED BY BLOOD BANK   ABO/Rh     Status: None   Collection Time: 08/08/15  7:48 PM  Result Value Ref Range    ABO/RH(D)  O POS   Troponin I     Status: Abnormal   Collection Time: 08/08/15  7:48 PM  Result Value Ref Range   Troponin I 0.03 (HH) <0.03 ng/mL    Comment: CRITICAL VALUE NOTED.  VALUE IS CONSISTENT WITH PREVIOUSLY REPORTED AND CALLED VALUE.  CBC WITH DIFFERENTIAL     Status: Abnormal   Collection Time: 08/08/15  7:48 PM  Result Value Ref Range   WBC 15.1 (H) 4.0 - 10.5 K/uL   RBC 1.43 (L) 3.87 - 5.11 MIL/uL   Hemoglobin 4.4 (LL) 12.0 - 15.0 g/dL    Comment: DELTA CHECK NOTED CRITICAL RESULT CALLED TO, READ BACK BY AND VERIFIED WITH: DANIELS J AT 2135 ON [id number] BY FORSYTH K    HCT 13.7 (L) 36.0 - 46.0 %   MCV 95.8 78.0 - 100.0 fL   MCH 30.8 26.0 - 34.0 pg   MCHC 32.1 30.0 - 36.0 g/dL   RDW 16.8 (H) 11.5 - 15.5 %   Platelets 219 150 - 400 K/uL   Neutrophils Relative % 84 %   Neutro Abs 12.7 (H) 1.7 - 7.7 K/uL   Lymphocytes Relative 12 %   Lymphs Abs 1.8 0.7 - 4.0 K/uL   Monocytes Relative 4 %   Monocytes Absolute 0.6 0.1 - 1.0 K/uL   Eosinophils Relative 0 %   Eosinophils Absolute 0.0 0.0 - 0.7 K/uL   Basophils Relative 0 %   Basophils Absolute 0.0 0.0 - 0.1 K/uL   WBC Morphology TOXIC GRANULATION     Comment: INCREASED BANDS (>20% BANDS)   RBC Morphology ELLIPTOCYTES     Comment: POLYCHROMASIA PRESENT   Smear Review LARGE PLATELETS PRESENT   MRSA PCR Screening     Status: None   Collection Time: 08/08/15  9:00 PM  Result Value Ref Range   MRSA by PCR NEGATIVE NEGATIVE    Comment:        The GeneXpert MRSA Assay (FDA approved for NASAL specimens only), is one component of a comprehensive MRSA colonization surveillance program. It is not intended to diagnose MRSA infection nor to guide or monitor treatment for MRSA infections.   Troponin I     Status: Abnormal   Collection Time: 08/09/15  3:16 AM  Result Value Ref Range   Troponin I 0.04 (HH) <0.03 ng/mL    Comment: CRITICAL RESULT CALLED TO, READ BACK BY AND VERIFIED WITH: PHILLIPS,C AT 4:15AM ON 08/09/15 BY  FESTERMAN,C   CBC WITH DIFFERENTIAL     Status: Abnormal   Collection Time: 08/09/15  3:16 AM  Result Value Ref Range   WBC 15.5 (H) 4.0 - 10.5 K/uL   RBC 2.80 (L) 3.87 - 5.11 MIL/uL   Hemoglobin 8.5 (L) 12.0 - 15.0 g/dL    Comment: DELTA CHECK NOTED POST TRANSFUSION SPECIMEN    HCT 24.3 (L) 36.0 - 46.0 %   MCV 86.8 78.0 - 100.0 fL    Comment: DELTA CHECK NOTED POST TRANSFUSION SPECIMEN    MCH 30.4 26.0 - 34.0 pg   MCHC 35.0 30.0 - 36.0 g/dL   RDW 15.9 (H) 11.5 - 15.5 %   Platelets 179 150 - 400 K/uL   Neutrophils Relative % 89 %   Neutro Abs 13.9 (H) 1.7 - 7.7 K/uL   Lymphocytes Relative 9 %   Lymphs Abs 1.3 0.7 - 4.0 K/uL   Monocytes Relative 2 %   Monocytes Absolute 0.3 0.1 - 1.0 K/uL   Eosinophils Relative 0 %   Eosinophils Absolute  0.0 0.0 - 0.7 K/uL   Basophils Relative 0 %   Basophils Absolute 0.0 0.0 - 0.1 K/uL   WBC Morphology TOXIC GRANULATION    RBC Morphology POLYCHROMASIA PRESENT   Basic metabolic panel     Status: Abnormal   Collection Time: 08/09/15  3:16 AM  Result Value Ref Range   Sodium 135 135 - 145 mmol/L   Potassium 3.8 3.5 - 5.1 mmol/L   Chloride 102 101 - 111 mmol/L   CO2 25 22 - 32 mmol/L   Glucose, Bld 144 (H) 65 - 99 mg/dL   BUN 46 (H) 6 - 20 mg/dL   Creatinine, Ser 1.04 (H) 0.44 - 1.00 mg/dL   Calcium 7.5 (L) 8.9 - 10.3 mg/dL   GFR calc non Af Amer 54 (L) >60 mL/min   GFR calc Af Amer >60 >60 mL/min    Comment: (NOTE) The eGFR has been calculated using the CKD EPI equation. This calculation has not been validated in all clinical situations. eGFR's persistently <60 mL/min signify possible Chronic Kidney Disease.    Anion gap 8 5 - 15    Dg Chest Portable 1 View  08/08/2015  CLINICAL DATA:  Tachycardia and shortness of breath. EXAM: PORTABLE CHEST 1 VIEW COMPARISON:  07/17/2015 FINDINGS: 1801 hours. Lungs are hyperexpanded. The lungs are clear wiithout focal pneumonia, edema, pneumothorax or pleural effusion. The cardiopericardial  silhouette is within normal limits for size. The visualized bony structures of the thorax are intact. Telemetry leads overlie the chest. IMPRESSION: Emphysema without acute cardiopulmonary findings. Electronically Signed   By: Misty Stanley M.D.   On: 08/08/2015 18:14    Review of Systems  Neurological: Positive for weakness.   Blood pressure 130/72, pulse 72, temperature 97.9 F (36.6 C), temperature source Oral, resp. rate 22, height '5\' 4"'  (1.626 m), weight 137 lb 2 oz (62.2 kg), SpO2 100 %. Physical Exam Alert and oriented. Skin warm and dry. Oral mucosa is moist.   . Sclera anicteric, conjunctivae is pink. Thyroid not enlarged. No cervical lymphadenopathy. Lungs clear. Heart regular rate irregular  Abdomen is soft. Bowel sounds are positive. No hepatomegaly. No abdominal masses felt. No tenderness.  1-2+ edema to lower extremities.  Assessment/Plan: Melena in the setting of anti-coagulant therapy. PUD needs to be ruled out. Dr. Laural Golden is aware.  SETZER,TERRI W 08/09/2015, 7:45 AM     GI attending note: Patient interviewed and examined. Patient is 65 year old Caucasian female with multiple medical problems including atrial fibrillation who is on an anticoagulant along with low-dose aspirin and also takes meloxicam who presents with four-day history of melena and profound anemia. She has received 2 units of PRBCs and hemoglobin is increased from 4.7 to 8.5. Patient states she has been on meloxicam for couple of years. She denies heartburn dysphagia by history of peptic ulcer disease. She did experience nausea and vague abdominal pain prior to admission. She denies anorexia weight loss. She also denies chest pain or shortness of breath. Patient is alert and appears comfortable. Cardiac exam with regular rhythm normal S1 and S2. No murmur or gallop noted. Abdomen is soft and nontender.  Assessment: Possible upper GI bleed secondary to peptic ulcer disease given that patient is on low-dose  aspirin and meloxicam in the setting of anticoagulant. She appears to be hemodynamically stable. She has mildly elevated troponin level possibly secondary to profound anemia. Cardiology consultation is underway. Patient is agreeable to proceed with EGD to be performed later today. Patient advised not to take NSAIDs  other than low-dose aspirin if recommended by her cardiologist.

## 2015-08-09 NOTE — Op Note (Signed)
[facility] Patient Name: Lindsay Salazar Procedure Date: 08/09/2015 11:09 AM MRN: [id number] Date of Birth: 08/16/1950 Attending MD: Hildred Laser , MD CSN: [id number] Age: 65 Admit Type: Inpatient Procedure:                Upper GI endoscopy Indications:              Acute post hemorrhagic anemia, Melena Providers:                Hildred Laser, MD, Lurline Del, RN, Isabella Stalling,                            Technician Referring MD:             [name], MD Medicines:                Cetacaine spray, Meperidine 50 mg IV, Midazolam 3                            mg IV Complications:            No immediate complications. Estimated Blood Loss:     Estimated blood loss: none. Procedure:                Pre-Anesthesia Assessment:                           - Prior to the procedure, a History and Physical                            was performed, and patient medications and                            allergies were reviewed. The patient's tolerance of                            previous anesthesia was also reviewed. The risks                            and benefits of the procedure and the sedation                            options and risks were discussed with the patient.                            All questions were answered, and informed consent                            was obtained. Prior Anticoagulants: The patient                            last took aspirin 1 day, Xarelto (rivaroxaban) 1                            day and previous NSAID medication 2 days prior to  the procedure. ASA Grade Assessment: III - A                            patient with severe systemic disease. After                            reviewing the risks and benefits, the patient was                            deemed in satisfactory condition to undergo the                            procedure.                           After obtaining informed consent, the endoscope was                   passed under direct vision. Throughout the                            procedure, the patient's blood pressure, pulse, and                            oxygen saturations were monitored continuously. The                            [id number] scope was introduced through the                            mouth, and advanced to the second part of duodenum.                            The upper GI endoscopy was accomplished without                            difficulty. The patient tolerated the procedure                            well. Scope In: 11:52:44 AM Scope Out: 11:56:38 AM Total Procedure Duration: 0 hours 3 minutes 54 seconds  Findings:      The examined esophagus was normal.      The Z-line was irregular and was found 37 cm from the incisors.      A 2 cm hiatal hernia was present.      One, small non-bleeding erosion was found in the gastric antrum. There       were no stigmata of recent bleeding.      One non-bleeding cratered gastric ulcer with pigmented material was       found at the pylorus. The lesion was 10 mm in largest dimension.      The exam of the stomach was otherwise normal.      The duodenal bulb and second portion of the duodenum were normal. Impression:               - Normal esophagus.                           -  Z-line irregular, 37 cm from the incisors.                           - 2 cm hiatal hernia.                           - Non-bleeding erosive gastropathy.                           - Non-bleeding gastric ulcer with pigmented                            material felt to be source of GI bleeding.                           - Normal duodenal bulb and second portion of the                            duodenum.                           - No specimens collected. Moderate Sedation:      Moderate (conscious) sedation was administered by the endoscopy nurse       and supervised by the endoscopist. The following parameters were       monitored:  oxygen saturation, heart rate, blood pressure, CO2       capnography and response to care. Total physician intraservice time was       9 minutes. Recommendation:           - Return patient to ICU for ongoing care.                           - Cardiac diet today.                           - Continue present medications.                           - Use sucralfate suspension 1 gram PO QID.                           - Perform an H. pylori serology.                           - Resume aspirin on 0/2017.                           - Resumes her also on 08/12/2015.                           - No more meloxicam or other NSAI                           - Repeat upper endoscopy in 12 weeks to check  healing.                           - May consider screening colonoscopy at the time of                            next EGD. Procedure Code(s):        --- Professional ---                           [phone], Esophagogastroduodenoscopy, flexible,                            transoral; diagnostic, including collection of                            specimen(s) by brushing or washing, when performed                            (separate procedure) Diagnosis Code(s):        --- Professional ---                           K22.8, Other specified diseases of esophagus                           K44.9, Diaphragmatic hernia without obstruction or                            gangrene                           K31.89, Other diseases of stomach and duodenum                           K25.9, Gastric ulcer, unspecified as acute or                            chronic, without hemorrhage or perforation                           D62, Acute posthemorrhagic anemia                           K92.1, Melena (includes Hematochezia) CPT copyright 2016 American [employer] Association. All rights reserved. The codes documented in this report are preliminary and upon coder review may  be revised to meet current  compliance requirements. Hildred Laser, MD Hildred Laser, MD 08/09/2015 12:19:54 PM This report has been signed electronically. Number of Addenda: 0

## 2015-08-10 LAB — CBC
HEMATOCRIT: 26.7 % — AB (ref 36.0–46.0)
HEMOGLOBIN: 9.3 g/dL — AB (ref 12.0–15.0)
MCH: 30.1 pg (ref 26.0–34.0)
MCHC: 34.8 g/dL (ref 30.0–36.0)
MCV: 86.4 fL (ref 78.0–100.0)
Platelets: 162 10*3/uL (ref 150–400)
RBC: 3.09 MIL/uL — AB (ref 3.87–5.11)
RDW: 16.5 % — ABNORMAL HIGH (ref 11.5–15.5)
WBC: 15.2 10*3/uL — AB (ref 4.0–10.5)

## 2015-08-10 LAB — TYPE AND SCREEN
ABO/RH(D): O POS
ANTIBODY SCREEN: NEGATIVE
UNIT DIVISION: 0
UNIT DIVISION: 0
Unit division: 0

## 2015-08-10 MED ORDER — DILTIAZEM HCL 30 MG PO TABS
30.0000 mg | ORAL_TABLET | Freq: Four times a day (QID) | ORAL | Status: DC
  Administered 2015-08-10 – 2015-08-12 (×8): 30 mg via ORAL
  Filled 2015-08-10 (×9): qty 1

## 2015-08-10 MED ORDER — HYDROCODONE-ACETAMINOPHEN 5-325 MG PO TABS
1.0000 | ORAL_TABLET | ORAL | Status: DC | PRN
  Administered 2015-08-10 – 2015-08-12 (×12): 1 via ORAL
  Filled 2015-08-10 (×13): qty 1

## 2015-08-10 MED ORDER — DILTIAZEM HCL 30 MG PO TABS
30.0000 mg | ORAL_TABLET | Freq: Four times a day (QID) | ORAL | Status: DC
Start: 2015-08-10 — End: 2015-08-10
  Administered 2015-08-10: 30 mg via ORAL
  Filled 2015-08-10: qty 1

## 2015-08-10 MED ORDER — PANTOPRAZOLE SODIUM 40 MG PO TBEC: 40.0000 mg | DELAYED_RELEASE_TABLET | Freq: Two times a day (BID) | ORAL | Status: DC

## 2015-08-10 MED ORDER — PANTOPRAZOLE SODIUM 40 MG PO TBEC
40.0000 mg | DELAYED_RELEASE_TABLET | Freq: Two times a day (BID) | ORAL | Status: DC
  Administered 2015-08-11 – 2015-08-12 (×3): 40 mg via ORAL
  Filled 2015-08-10 (×4): qty 1

## 2015-08-10 NOTE — Progress Notes (Signed)
  Subjective:  Patient feels better. She complains of minimal epigastric discomfort. She denies nausea vomiting melena or rectal bleeding. She states she would not take meloxicam or similar medications in future. She denies chest pain or shortness of breath. She has good appetite.   Objective: Blood pressure 138/77, pulse 86, temperature 98 F (36.7 C), temperature source Oral, resp. rate 23, height 5\' 4"  (1.626 m), weight 137 lb 2 oz (62.2 kg), SpO2 100 %. Patient is alert and in no acute distress. Abdomen is soft with mild midepigastric tenderness. No organomegaly or masses. No LE edema or clubbing noted.  Labs/studies Results:   Recent Labs  08/09/15 1649 08/09/15 2159 08/10/15 0413  WBC 16.0* 16.7* 15.2*  HGB 9.7* 9.9* 9.3*  HCT 28.1* 28.2* 26.7*  PLT 162 164 162    BMET   Recent Labs  08/08/15 1749 08/09/15 0316  NA 138 135  K 4.4 3.8  CL 105 102  CO2 22 25  GLUCOSE 198* 144*  BUN 49* 46*  CREATININE 1.02* 1.04*  CALCIUM 7.7* 7.5*    H. Pylori serology is pending.   Assessment:  #1. GI bleed felt to be secondary to pyloric channel ulcer discovered on EGD yesterday. She also had erosive gastritis. H. pylori urology is pending. #2. Anemia secondary to GI bleed. Patient has received 3 units of PRBCs. Myoglobin now is 9.3 g. Lowest was 4.5 g. #3. Atrial fibrillation. Patient remains with rapid ventricular rate. Dr. Harl Bowie of cardiology service seen patient.   Recommendations:  Aspirin resumed at a low dose. Start anticoagulant on 08/12/2015. Begin pantoprazole 40 mg by mouth twice a day. Discontinue pantoprazole infusion when current bag runs out. CBC in a.m.

## 2015-08-10 NOTE — Progress Notes (Addendum)
Primary Cardiologist:McDowell, Mikeal Hawthorne MD  Cardiology Specific Problem List: 1.Atrial fibrillation with RVR-MAT   Subjective:    Feeling better. Stomach less sore. Eating. No more dark diarrhea.   Objective:   Temp:  [97.6 F (36.4 C)-98.9 F (37.2 C)] 98 F (36.7 C) (07/07 0522) Pulse Rate:  [29-142] 75 (07/07 0700) Resp:  [12-41] 21 (07/07 0700) BP: (104-155)/(55-109) 129/76 mmHg (07/07 0700) SpO2:  [95 %-100 %] 99 % (07/07 0700) Last BM Date: 08/08/15  Filed Weights   08/08/15 2106  Weight: 137 lb 2 oz (62.2 kg)    Intake/Output Summary (Last 24 hours) at 08/10/15 0802 Last data filed at 08/10/15 0600  Gross per 24 hour  Intake 1080.21 ml  Output    300 ml  Net 780.21 ml    Telemetry: Atrial fibrillation rates in the 90's.   Exam:  General: No acute distress.  HEENT: Conjunctiva and lids normal, oropharynx clear.  Lungs: Clear to auscultation, nonlabored.  Cardiac: No elevated JVP or bruits. IRRR, no gallop or rub.   Abdomen: Normoactive bowel sounds, nontender, nondistended.  Extremities: No pitting edema, distal pulses full.Tremors   Neuropsychiatric: Alert and oriented x3, affect appropriate.   Lab Results:  Basic Metabolic Panel:  Recent Labs Lab 08/08/15 1749 08/09/15 0316 08/09/15 1007  NA 138 135  --   K 4.4 3.8  --   CL 105 102  --   CO2 22 25  --   GLUCOSE 198* 144*  --   BUN 49* 46*  --   CREATININE 1.02* 1.04*  --   CALCIUM 7.7* 7.5*  --   MG  --   --  1.7    CBC:  Recent Labs Lab 08/09/15 1649 08/09/15 2159 08/10/15 0413  WBC 16.0* 16.7* 15.2*  HGB 9.7* 9.9* 9.3*  HCT 28.1* 28.2* 26.7*  MCV 86.7 86.5 86.4  PLT 162 164 162    Cardiac Enzymes:  Recent Labs Lab 08/08/15 1948 08/09/15 0316 08/09/15 0851  TROPONINI 0.03* 0.04* 0.03*    Radiology: Dg Chest Portable 1 View  08/08/2015  CLINICAL DATA:  Tachycardia and shortness of breath. EXAM: PORTABLE CHEST 1 VIEW COMPARISON:  07/17/2015 FINDINGS: 1801  hours. Lungs are hyperexpanded. The lungs are clear wiithout focal pneumonia, edema, pneumothorax or pleural effusion. The cardiopericardial silhouette is within normal limits for size. The visualized bony structures of the thorax are intact. Telemetry leads overlie the chest. IMPRESSION: Emphysema without acute cardiopulmonary findings. Electronically Signed   By: Misty Stanley M.D.   On: 08/08/2015 18:14     Medications:   Scheduled Medications: . aspirin EC  81 mg Oral Daily  . escitalopram  10 mg Oral QPM  . ipratropium-albuterol  3 mL Inhalation Q6H  . nicotine  14 mg Transdermal Daily  . [START ON 08/12/2015] pantoprazole  40 mg Intravenous Q12H  . sodium chloride flush  3 mL Intravenous Q12H  . sucralfate  1 g Oral TID WC & HS    Infusions: . diltiazem (CARDIZEM) infusion 5 mg/hr (08/10/15 0600)  . pantoprozole (PROTONIX) infusion 8 mg/hr (08/10/15 0600)    PRN Medications: sodium chloride, acetaminophen, albuterol, ALPRAZolam, morphine injection, ondansetron (ZOFRAN) IV, sodium chloride flush, zolpidem   Assessment and Plan:   1.Tachycardia:  Now back on po and eating. Will take her off of the diltiazem gtt and change her back to po diltiazem. She is on 120 mg BID at home. BP is low normal. Will start on 30 mg Q 6 to  evaluate her HR and BP response before restarting long-acting. Review of GI note states that she can start back on anticoagulation on August 11, 2015.   2. GIB: Reviewed her EDG note. Gastric ulcer was noted but not actively bleeding but felt to be the source of GI bleeding. Also found erosive gastropathy without active bleeding.   Phill Myron. Lawrence NP [place]  08/10/2015, 8:02 AM   Patient seen and discussed with NP Lawerence, I agree with her documentation above. We will change to oral dilt this AM with 30mg  po q 6 hrs, if hemodynamics remain stable can consolidate to long acting dilitiazem. Defer when to restart anticoag to GI. Previous history of afib with recent  DCCV, EKGs this admit more consistent with MAT. 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   Zandra Abts MD

## 2015-08-10 NOTE — Care Management Important Message (Signed)
Important Message  Patient Details  Name: CARMILITA KRZYWICKI MRN: [id number] Date of Birth: [birth year]   Medicare Important Message Given:  Yes    Abiageal Blowe, Chauncey Reading, RN 08/10/2015, 1:48 PM

## 2015-08-10 NOTE — Progress Notes (Signed)
PROGRESS NOTE  Lindsay Salazar [id number] DOB: Aug 05, 1950 DOA: 08/08/2015 PCP: [name], PA-C  Brief Narrative: 65 year old woman with history of atrial fibrillation and recent hospitalizations, on anticoagulation, presented with chest pain, syncope, cough, fatigue, dark stools. Admitted for GI bleed, profound acute blood loss anemia, atrial fibrillation with rapid ventricular response. GI consulted and performed EGD which revealed non-bleeding gastric ulcer with pigmented material felt to be source of GI bleeding.  Assessment/Plan: 1. Upper GI bleed secondary to PUD in the context of anticoagulation, recent steroid use, NSAID. EGD revealed non-bleeding erosive gastropathy and non-bleeding gastric ulcer with pigmented material felt to be source of GI bleeding. Continue Protonix infusion.  2. Profound acute blood loss anemia. Secondary to upper GI bleed. Currently hemodynamically stable. Hgb noted to be as low as 4.4, but continues to remain stable s/p transfusion 2 units pRBCs.  3. Afib with RVR, MAT, wandering atrial pacemaker. Patient has had 2 recent hospitalizations for this, was converted with Cardizem the first time, and electrically cardioverted the second time. Currently rate controlled with IV diltiazem. CHADS2Vasc score 3. Appreciate cardiology recommendations 4. Chest pain, resolved. Troponins flat, no evidence of ACS. Minimal troponin elevation secondary to demand ischemia. 5. COPD, chronic hypoxic respiratory failure on home oxygen. Stable.  6. 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 Hemodynamics are stable, bleeding appears to have stopped. Hemoglobin continues to remain stable s/p transfusion, will continue to monitor.   Follow up GI recs and continue PPI infusion.   Transition to PO Cardizem per cardiology.  Transfer to floor later today if heart rate remains controlled  DVT prophylaxis: SCDs Code Status: Full Family Communication: discussed with patient.  No family present at bedside. Patient's family, Lindsay Salazar) Disposition Plan: Discharge home once improved  [name], MD  [facility] Direct contact: [phone] --[address]ia [place]  --[email or web address]; password [email or web address]  7PM-7AM contact night coverage as above 08/10/2015, 5:42 AM  LOS: 2 days   Consultants:  Cardiology  GI   Procedures:  EGD Impression: - Normal esophagus.  - Z-line irregular, 37 cm from the incisors.  - 2 cm hiatal hernia.  - Non-bleeding erosive gastropathy.  - Non-bleeding gastric ulcer with pigmented   material felt to be source of GI bleeding.  - Normal duodenal bulb and second portion of the   duodenum.  - No specimens collected.  7/5 1 unit PRBC  7/6 1 unit PRBC  Antimicrobials:  none  HPI/Subjective: Reports continued abd tenderness. No nausea or vomiting. Breathing well. Denies any further bleeding.    Objective: Filed Vitals:   08/10/15 0445 08/10/15 0500 08/10/15 0515 08/10/15 0522  BP: 131/85 121/104 118/81   Pulse: 66 137 59   Temp:    98 F (36.7 C)  TempSrc:    Oral  Resp: 23 24 21    Height:      Weight:      SpO2: 99% 98% 99%     Intake/Output Summary (Last 24 hours) at 08/10/15 0542 Last data filed at 08/10/15 0500  Gross per 24 hour  Intake 1155.21 ml  Output    300 ml  Net 855.21 ml     Filed Weights   08/08/15 2106  Weight: 62.2 kg (137 lb 2 oz)    Exam: Constitutional:  Appears calm and comfortable Eyes:  PERRL and irises appear normal - bilateral cataracts Conjunctivae and lids appear normal ENMT:  external ears, nose appear normal grossly normal hearing  Respiratory:  Irregular no  murmer rub or gallop Respiratory effort normal. No retractions or accessory muscle  use Cardiovascular:  RRR, no m/r/g 1+ bilateral LE extremity edema   Telemetry shows Afib Abdomen:  Abdomen appears normal;  mild generalized tenderness, non-distended Musculoskeletal:  Moves all extremities to command Psychiatric:  judgement and insight appear normal Mental status Mood, affect appropriate  I have personally reviewed following labs and imaging studies:  EGD shows no-bleeding gastric ulcer with pigmented material  WBC 15.2  Hgb 9.3, stable, Htc 26.7  Platelets 162  Scheduled Meds: . aspirin EC  81 mg Oral Daily  . escitalopram  10 mg Oral QPM  . ipratropium-albuterol  3 mL Inhalation Q6H  . nicotine  14 mg Transdermal Daily  . [START ON 08/12/2015] pantoprazole  40 mg Intravenous Q12H  . sodium chloride flush  3 mL Intravenous Q12H  . sucralfate  1 g Oral TID WC & HS   Continuous Infusions: . diltiazem (CARDIZEM) infusion 5 mg/hr (08/10/15 0500)  . pantoprozole (PROTONIX) infusion 8 mg/hr (08/10/15 0500)    Principal Problem:   Gastrointestinal hemorrhage with melena Active Problems:   Atrial fibrillation with RVR (HCC)   Tobacco abuse   Elevated troponin   Acute blood loss anemia   Chronic obstructive pulmonary disease (COPD) (HCC)   Chest pain   LOS: 2 days   Time spent 25 minutes  By signing my name below, I, Delene Ruffini, attest that this documentation has been prepared under the direction and in the presence of Yanique Mulvihill P. Sarajane Jews, MD. Electronically Signed: Delene Ruffini, Scribe.  08/10/2015 8:45am       I personally performed the services described in this documentation. All medical record entries made by the scribe were at my direction. I have reviewed the chart and agree that the record reflects my personal performance and is accurate and complete. [name], MD

## 2015-08-11 LAB — CBC
HCT: 28.3 % — ABNORMAL LOW (ref 36.0–46.0)
HEMOGLOBIN: 9.3 g/dL — AB (ref 12.0–15.0)
MCH: 29.4 pg (ref 26.0–34.0)
MCHC: 32.9 g/dL (ref 30.0–36.0)
MCV: 89.6 fL (ref 78.0–100.0)
Platelets: 175 10*3/uL (ref 150–400)
RBC: 3.16 MIL/uL — ABNORMAL LOW (ref 3.87–5.11)
RDW: 16.5 % — ABNORMAL HIGH (ref 11.5–15.5)
WBC: 13.3 10*3/uL — ABNORMAL HIGH (ref 4.0–10.5)

## 2015-08-11 LAB — H. PYLORI ANTIBODY, IGG: H Pylori IgG: 1.7 U/mL — ABNORMAL HIGH (ref 0.0–0.8)

## 2015-08-11 NOTE — Progress Notes (Signed)
PROGRESS NOTE  Lindsay Salazar [id number] DOB: [birth year] DOA: 08/08/2015 PCP: [name], PA-C  Brief Narrative: 65 year old woman with history of atrial fibrillation and recent hospitalizations, on anticoagulation, presented with chest pain, syncope, cough, fatigue, dark stools. Admitted for GI bleed, profound acute blood loss anemia, atrial fibrillation with rapid ventricular response. GI consulted and performed EGD which revealed non-bleeding gastric ulcer with pigmented material felt to be source of GI bleeding.  Assessment/Plan: 1. Upper GI bleed secondary to PUD in the context of anticoagulation, recent steroid use, NSAID. No recurrence. Hemoglobin stable. EGD revealed non-bleeding erosive gastropathy and non-bleeding gastric ulcer with pigmented material felt to be source of GI bleeding.  2. Profound acute blood loss anemia. Secondary to upper GI bleed.Stable s/p transfusion 2 units pRBCs.  3. Afib with RVR, MAT, wandering atrial pacemaker. Patient has had 2 recent hospitalizations for this, was converted with Cardizem the first time, and electrically cardioverted the second time. CHADS2Vasc score 3. Remains rate controlled on oral medication. 4. Chest pain, resolved. Troponins flat, no evidence of ACS. Minimal troponin elevation secondary to demand ischemia. No further evaluation suggested. 5. COPD, chronic hypoxic respiratory failure on home oxygen. Stable.  6. 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 Hemoglobin remained stable. No recurrent bleeding.  Repeat EGD in 12 weeks, continue oral PPI twice a day. No NSAIDs.  Resume anticoagulation 7/9.  Begin Pylera the time of discharge. Dose is three capsules 4 times a day for total of 10 days.  Office visit in 4 weeks  DVT prophylaxis: SCDs Code Status: Full Family Communication:   Disposition Plan: home 7/9  [name], MD  [facility] Direct contact: [phone] --Via amion app  OR  --[email or web address]; password [email or web address]  7PM-7AM contact night coverage as above 08/11/2015, 6:39 AM  LOS: 3 days   Consultants:  Cardiology  GI   Procedures:  EGD Impression: - Normal esophagus.  - Z-line irregular, 37 cm from the incisors.  - 2 cm hiatal hernia.  - Non-bleeding erosive gastropathy.  - Non-bleeding gastric ulcer with pigmented   material felt to be source of GI bleeding.  - Normal duodenal bulb and second portion of the   duodenum.  - No specimens collected.  7/5 1 unit PRBC  7/6 1 unit PRBC  Antimicrobials:  none  HPI/Subjective: Feels better, no bleeding. Minimal abd pain.  Objective: Filed Vitals:   08/10/15 2000 08/10/15 2235 08/11/15 0113 08/11/15 0614  BP:  132/70  130/89  Pulse:  78  94  Temp: 97.6 F (36.4 C) 98.4 F (36.9 C)  98.6 F (37 C)  TempSrc: Oral Oral  Oral  Resp:  20  20  Height:      Weight:      SpO2:  97% 98% 98%    Intake/Output Summary (Last 24 hours) at 08/11/15 0639 Last data filed at 08/11/15 0616  Gross per 24 hour  Intake 1455.58 ml  Output   1800 ml  Net -344.42 ml     Filed Weights   08/08/15 2106  Weight: 62.2 kg (137 lb 2 oz)    Exam: Constitutional:  . Appears calm and comfortable Respiratory:  . CTA bilaterally, no w/r/r.  . Respiratory effort normal. No retractions or accessory muscle use Cardiovascular:  . RRR, no m/r/g Psychiatric:  . judgement and insight appear normal . Mental status o Mood, affect appropriate  I have personally reviewed following labs and imaging studies:  Hgb 9.3, WBC 13.3  Scheduled  Meds: . diltiazem  30 mg Oral Q6H  . escitalopram  10 mg Oral QPM  . ipratropium-albuterol  3 mL Inhalation Q6H  . nicotine  14 mg Transdermal Daily  . pantoprazole  40  mg Oral BID AC  . sodium chloride flush  3 mL Intravenous Q12H  . sucralfate  1 g Oral TID WC & HS   Continuous Infusions:    Principal Problem:   Gastrointestinal hemorrhage with melena Active Problems:   Atrial fibrillation with RVR (HCC)   Tobacco abuse   Elevated troponin   Acute blood loss anemia   Chronic obstructive pulmonary disease (COPD) (HCC)   Chest pain   LOS: 3 days   Time spent 25 minutes  By signing my name below, I, Delene Ruffini, attest that this documentation has been prepared under the direction and in the presence of [name] Peeters P. Sarajane Jews, MD. Electronically Signed: Delene Ruffini, Scribe.  08/11/2015    I personally performed the services described in this documentation. All medical record entries made by the scribe were at my direction. I have reviewed the chart and agree that the record reflects my personal performance and is accurate and complete. [name], MD

## 2015-08-11 NOTE — Progress Notes (Signed)
  Subjective:  Patient states she has good appetite. She has been burping but no nausea heartburn or vomiting. She has mild epigastric discomfort. She denies melena or rectal bleeding. She says her strength is coming back but at times she feels very weak. She denies shortness of breath or chest pain.    Objective: Blood pressure 130/89, pulse 94, temperature 98.6 F (37 C), temperature source Oral, resp. rate 20, height 5\' 4"  (1.626 m), weight 137 lb 2 oz (62.2 kg), SpO2 99 %. Patient is alert and eating her lunch. Abdomen is soft with mild gastric tenderness. No LE edema or clubbing noted.  Labs/studies Results:   Recent Labs  08/09/15 2159 08/10/15 0413 08/11/15 0710  WBC 16.7* 15.2* 13.3*  HGB 9.9* 9.3* 9.3*  HCT 28.2* 26.7* 28.3*  PLT 164 162 175    BMET   Recent Labs  08/08/15 1749 08/09/15 0316  NA 138 135  K 4.4 3.8  CL 105 102  CO2 22 25  GLUCOSE 198* 144*  BUN 49* 46*  CREATININE 1.02* 1.04*  CALCIUM 7.7* 7.5*      Assessment:  #1. 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 Patient did not require therapeutic intervention. No evidence of recurrent GI bleed. H. pylori serology is positive and treatment can be appreciated at the time of discharge. #2. Anemia secondary to GI bleed. H&H is low but stable. She has received 3 units of PRBCs. #3. Tachyarrhythmia. Multiple rhythm patterns per Dr. Harl Bowie.  Recommendations:  Patient has been transitioned from IV to oral pantoprazole at 40 mg by mouth twice a day which she should continue until healing documented by EGD in 12 weeks. Will also offer colonoscopy at the time of EGD. Patient advised not to take OTC NSAIDs. Anticoagulant can be resumed on 08/12/2015. Begin Pylera the time of discharge. Dose is three capsules 4 times a day for total of 10 days. Will arrange for office visit in 4 weeks. Will sign off.

## 2015-08-12 DIAGNOSIS — R7989 Other specified abnormal findings of blood chemistry: Secondary | ICD-10-CM

## 2015-08-12 MED ORDER — BIS SUBCIT-METRONID-TETRACYC 140-125-125 MG PO CAPS: 3.0000 | ORAL_CAPSULE | Freq: Three times a day (TID) | ORAL | Status: DC

## 2015-08-12 MED ORDER — PANTOPRAZOLE SODIUM 40 MG PO TBEC: 40.0000 mg | DELAYED_RELEASE_TABLET | Freq: Every day | ORAL | Status: DC

## 2015-08-12 MED ORDER — DILTIAZEM HCL ER COATED BEADS 120 MG PO CP24
120.0000 mg | ORAL_CAPSULE | Freq: Two times a day (BID) | ORAL | Status: DC
  Administered 2015-08-12: 120 mg via ORAL

## 2015-08-12 MED ORDER — RIVAROXABAN 20 MG PO TABS
20.0000 mg | ORAL_TABLET | Freq: Every day | ORAL | Status: DC
  Filled 2015-08-12: qty 1

## 2015-08-12 NOTE — Progress Notes (Signed)
CM contacted [facility] and faxed information for review and processing or HH.

## 2015-08-12 NOTE — Progress Notes (Signed)
PROGRESS NOTE  Lindsay Salazar [id number] DOB: 1950/11/16 DOA: 08/08/2015 PCP: [name], PA-C  Brief Narrative: 65 year old woman with history of atrial fibrillation and recent hospitalizations, on anticoagulation, presented with chest pain, syncope, cough, fatigue, dark stools. Admitted for GI bleed, profound acute blood loss anemia, atrial fibrillation with rapid ventricular response. GI consulted and performed EGD which revealed non-bleeding gastric ulcer with pigmented material felt to be source of GI bleeding.  Assessment/Plan: 1. Upper GI bleed secondary to PUD in the context of anticoagulation, recent steroid use, NSAID. No recurrence. Hemoglobin remains stable. EGD revealed non-bleeding erosive gastropathy and non-bleeding gastric ulcer with pigmented material felt to be source of GI bleeding. H. pylori positive, will treat as recommended by gastroenterology. 2. Profound acute blood loss anemia. Secondary to upper GI bleed. Stable s/p transfusion 2 units pRBCs. Remains stable. 3. Afib with RVR, MAT, wandering atrial pacemaker. Rate remains stable. Patient has had 2 recent hospitalizations for this, was converted with Cardizem the first time, and electrically cardioverted the second time. CHADS2Vasc score 3. 4. Chest pain, resolved. Troponins flat, no evidence of ACS. Minimal troponin elevation secondary to demand ischemia. No further evaluation suggested. 5. COPD, chronic hypoxic respiratory failure on home oxygen. Stable.  6. 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 No recurrent bleeding.  Repeat EGD in 12 weeks, continue oral PPI twice a day. No NSAIDs.  Resume anticoagulation today.  Begin Pylera the time of discharge. Dose is three capsules 4 times a day for total of 10 days.  Office visit with Dr. Laural Golden in 4 weeks  DVT prophylaxis: SCDs Code Status: Full Family Communication:  Discussed with patient, no family present Disposition Plan: home  7/9  [name], MD  [facility] Direct contact: [phone] --[address] [place]  --[email or web address]; password [email or web address]  7PM-7AM contact night coverage as above 08/12/2015, 6:29 AM  LOS: 4 days   Consultants:  Cardiology  GI   Procedures:  EGD Impression: - Normal esophagus.  - Z-line irregular, 37 cm from the incisors.  - 2 cm hiatal hernia.  - Non-bleeding erosive gastropathy.  - Non-bleeding gastric ulcer with pigmented   material felt to be source of GI bleeding.  - Normal duodenal bulb and second portion of the   duodenum.  - No specimens collected.  7/5 1 unit PRBC  7/6 1 unit PRBC  Antimicrobials:  none  HPI/Subjective: Overall feels better. Had mild chest discomfort early this morning which spontaneously resolved and may have been secondary to heartburn. Breathing fine. Tolerating diet. Feels generally weak.  Objective: Filed Vitals:   08/11/15 1528 08/11/15 2104 08/11/15 2232 08/12/15 0128  BP: 138/82  149/69   Pulse: 84  63   Temp: 97.6 F (36.4 C)  97.8 F (36.6 C)   TempSrc: Oral  Oral   Resp: 20  20   Height:      Weight:      SpO2: 98% 98% 100% 100%    Intake/Output Summary (Last 24 hours) at 08/12/15 0629 Last data filed at 08/11/15 1845  Gross per 24 hour  Intake    480 ml  Output   1400 ml  Net   -920 ml     Filed Weights   08/08/15 2106  Weight: 62.2 kg (137 lb 2 oz)    Exam: Constitutional:  . Appears calm and comfortable Respiratory:  . CTA bilaterally, no w/r/r.  . Respiratory effort normal. No retractions or accessory muscle use Cardiovascular:  . irregular,  no m/r/g . No LE extremity edema   Psychiatric:  . judgement and insight appear normal . Mental status o Mood, affect appropriate  I have  personally reviewed following labs and imaging studies:  No new data  Scheduled Meds: . diltiazem  30 mg Oral Q6H  . escitalopram  10 mg Oral QPM  . ipratropium-albuterol  3 mL Inhalation Q6H  . nicotine  14 mg Transdermal Daily  . pantoprazole  40 mg Oral BID AC  . sodium chloride flush  3 mL Intravenous Q12H  . sucralfate  1 g Oral TID WC & HS   Continuous Infusions:    Principal Problem:   Gastrointestinal hemorrhage with melena Active Problems:   Atrial fibrillation with RVR (HCC)   Tobacco abuse   Elevated troponin   Acute blood loss anemia   Chronic obstructive pulmonary disease (COPD) (HCC)   Chest pain   LOS: 4 days      By signing my name below, I, Delene Ruffini, attest that this documentation has been prepared under the direction and in the presence of Daniel P. Sarajane Jews, MD. Electronically Signed: Delene Ruffini, Scribe.  08/12/2015 11:00am   I personally performed the services described in this documentation. All medical record entries made by the scribe were at my direction. I have reviewed the chart and agree that the record reflects my personal performance and is accurate and complete. [name], MD

## 2015-08-12 NOTE — Discharge Summary (Signed)
Physician Discharge Summary  Yahdira Nishiyama [facility] [id number] DOB: [birth year] DOA: 08/08/2015  PCP: [name], PA-C  Admit date: 08/08/2015 Discharge date: 08/12/2015  Recommendations for Outpatient Follow-up:  1. Repeat EGD in 12 weeks, continue oral PPI, no NSAIDs 2. Begin Pylera on discharge. 3 capsules 4 times a day for 10 days. Follow up with PCP in 4 weeks 3. Resume anticoagulation on discharge  Follow-up Information    Follow up with BAUCOM, JENNY B, PA-C.   Specialty:  Physician Assistant   Why:  As needed   Contact information:   [address] [place] [address] [phone]       Follow up with Hildred Laser, MD In 4 weeks.   Specialty:  Gastroenterology   Why:  office will call you with appointment   Contact information:   [place], [address] [place] [address] [phone]      Discharge Diagnoses:  1. Upper GI bleed secondary to PUD 2. Profound acute blood loss anemia 3. Atrial fibrillation with rapid ventricular response 4. Chest pain 5. COPD 6. Tobacco use  Discharge Condition: improved Disposition: discharge home  Diet recommendation: heart healthy  Filed Weights   08/08/15 2106  Weight: 62.2 kg (137 lb 2 oz)    History of present illness:  65 year old woman with history of atrial fibrillation and recent hospitalizations, on anticoagulation, presented with chest pain, syncope, cough, fatigue, dark stools. Admitted for GI bleed, profound acute blood loss anemia, atrial fibrillation with rapid ventricular response.   Hospital Course:  She was started on protonix and transfused two units of pRBCs and admitted for further management. GI was consulted and performed an EGD which revealed a non-bleeding gastric ulcer, felt to be the source of her GI bleeding. No recurrence of bleeding was noted and her hemoglobin remained stable after receiving pRBC transfusion. H. pylori antibody was positive. She will be discharged with a prescription for  Pylera and will need to follow up with her PCP in 4 weeks. She will also need a repeat EGD in 12 weeks to ensure healing.  Additionally, she was found to have afib with RVR. She has had two recent hospitalizations for this. She was started on IV diltiazem with improvement in her rates which was maintained until her EGD. Cardiology was consulted who recommended transitioning the patient to oral Cardizem. Her heart rates have since remained stable. She will resume her anticoagulation today as well.   1. Upper GI bleed secondary to PUD in the context of anticoagulation, recent steroid use, NSAID. No recurrence. Hemoglobin remains stable. EGD revealed non-bleeding erosive gastropathy and non-bleeding gastric ulcer with pigmented material felt to be source of GI bleeding. H. pylori positive, will treat as recommended by gastroenterology. 2. Profound acute blood loss anemia. Secondary to upper GI bleed. Stable s/p transfusion 2 units pRBCs. Remains stable. 3. Afib with RVR, MAT, wandering atrial pacemaker. Rate remains stable. Patient has had 2 recent hospitalizations for this, was converted with Cardizem the first time, and electrically cardioverted the second time. CHADS2Vasc score 3. 4. Chest pain, resolved. Troponins flat, no evidence of ACS. Minimal troponin elevation secondary to demand ischemia. No further evaluation suggested. 5. COPD, chronic hypoxic respiratory failure on home oxygen. Stable.  6. Tobacco use disorder in early remission  Consultants:  Cardiology  GI  Procedures:  EGD Impression: - Normal esophagus.  - Z-line irregular, 37 cm from the incisors.  - 2 cm hiatal hernia.  - Non-bleeding erosive gastropathy.  -  Non-bleeding gastric ulcer with pigmented   material felt to be source of GI bleeding.  - Normal  duodenal bulb and second portion of the   duodenum.  - No specimens collected.  7/5 1 unit PRBC  7/6 1 unit PRBC  Discharge Instructions  Discharge Instructions    Activity as tolerated - No restrictions    Complete by:  As directed      Diet - low sodium heart healthy    Complete by:  As directed      Discharge instructions    Complete by:  As directed   Call your physician or seek immediate medical attention for bleeding, dizziness, weakness, chest pain, shortness of breath or worsening of condition.          Current Discharge Medication List    START taking these medications   Details  bismuth-metronidazole-tetracycline (PYLERA) 140-125-125 MG capsule Take 3 capsules by mouth 4 (four) times daily -  before meals and at bedtime. Qty: 120 capsule, Refills: 0      CONTINUE these medications which have CHANGED   Details  pantoprazole (PROTONIX) 40 MG tablet Take 1 tablet (40 mg total) by mouth daily. Qty: 60 tablet, Refills: 0      CONTINUE these medications which have NOT CHANGED   Details  albuterol (PROVENTIL) (2.5 MG/3ML) 0.083% nebulizer solution Take 3 mLs (2.5 mg total) by nebulization every 6 (six) hours as needed for wheezing or shortness of breath. Qty: 75 mL, Refills: 12    albuterol (VENTOLIN HFA) 108 (90 BASE) MCG/ACT inhaler Inhale 2 puffs into the lungs 2 (two) times daily. For shortness of breath    COMBIVENT RESPIMAT 20-100 MCG/ACT AERS respimat Inhale 1 puff into the lungs 2 (two) times daily.    diltiazem (CARDIZEM CD) 120 MG 24 hr capsule Take 1 capsule (120 mg total) by mouth 2 (two) times daily. Qty: 60 capsule, Refills: 6    escitalopram (LEXAPRO) 10 MG tablet Take 10 mg by mouth every evening.     furosemide (LASIX) 20 MG tablet Take 1 tablet (20 mg total) by mouth daily as needed for edema. Qty: 90 tablet, Refills: 3    HYDROcodone-acetaminophen (NORCO/VICODIN) 5-325 MG tablet Take 1 tablet  by mouth every 4 (four) hours as needed for moderate pain (Must last 30 days.  Do not take and drive a car or use machinery.). Qty: 120 tablet, Refills: 0    ipratropium-albuterol (DUONEB) 0.5-2.5 (3) MG/3ML SOLN Inhale 3 mLs into the lungs every 6 (six) hours as needed (for shortness of breath).     rivaroxaban (XARELTO) 20 MG TABS tablet Take 1 tablet (20 mg total) by mouth daily with supper. Qty: 30 tablet, Refills: 10    SYMBICORT 160-4.5 MCG/ACT inhaler Inhale 1 puff into the lungs 2 (two) times daily.      STOP taking these medications     predniSONE (DELTASONE) 10 MG tablet        Allergies  Allergen Reactions  . Ibuprofen Nausea And Vomiting  . Penicillins Swelling    Has patient had a PCN reaction causing immediate rash, facial/tongue/throat swelling, SOB or lightheadedness with hypotension: Yes Has patient had a PCN reaction causing severe rash involving mucus membranes or skin necrosis: No Has patient had a PCN reaction that required hospitalization No Has patient had a PCN reaction occurring within the last 10 years: No If all of the above answers are "NO", then may proceed with Cephalosporin use.   . Tiotropium Bromide Monohydrate Itching  .  Tramadol Nausea And Vomiting  . Vicodin [Hydrocodone-Acetaminophen] Nausea And Vomiting    The results of significant diagnostics from this hospitalization (including imaging, microbiology, ancillary and laboratory) are listed below for reference.    Significant Diagnostic Studies: Dg Chest Portable 1 View  08/08/2015  CLINICAL DATA:  Tachycardia and shortness of breath. EXAM: PORTABLE CHEST 1 VIEW COMPARISON:  07/17/2015 FINDINGS: 1801 hours. Lungs are hyperexpanded. The lungs are clear wiithout focal pneumonia, edema, pneumothorax or pleural effusion. The cardiopericardial silhouette is within normal limits for size. The visualized bony structures of the thorax are intact. Telemetry leads overlie the chest. IMPRESSION: Emphysema  without acute cardiopulmonary findings. Electronically Signed   By: Misty Stanley M.D.   On: 08/08/2015 18:14   Microbiology: Recent Results (from the past 240 hour(s))  MRSA PCR Screening     Status: None   Collection Time: 08/08/15  9:00 PM  Result Value Ref Range Status   MRSA by PCR NEGATIVE NEGATIVE Final    Comment:        The GeneXpert MRSA Assay (FDA approved for NASAL specimens only), is one component of a comprehensive MRSA colonization surveillance program. It is not intended to diagnose MRSA infection nor to guide or monitor treatment for MRSA infections.      Labs: Basic Metabolic Panel:  Recent Labs Lab 08/08/15 1749 08/09/15 0316 08/09/15 1007  NA 138 135  --   K 4.4 3.8  --   CL 105 102  --   CO2 22 25  --   GLUCOSE 198* 144*  --   BUN 49* 46*  --   CREATININE 1.02* 1.04*  --   CALCIUM 7.7* 7.5*  --   MG  --   --  1.7   CBC:  Recent Labs Lab 08/08/15 1948 08/09/15 0316 08/09/15 0851 08/09/15 1649 08/09/15 2159 08/10/15 0413 08/11/15 0710  WBC 15.1* 15.5* 16.0* 16.0* 16.7* 15.2* 13.3*  NEUTROABS 12.7* 13.9* 14.1* 14.5* 15.1*  --   --   HGB 4.4* 8.5* 7.7* 9.7* 9.9* 9.3* 9.3*  HCT 13.7* 24.3* 22.2* 28.1* 28.2* 26.7* 28.3*  MCV 95.8 86.8 87.1 86.7 86.5 86.4 89.6  PLT 219 179 175 162 164 162 175   Cardiac Enzymes:  Recent Labs Lab 08/08/15 1749 08/08/15 1948 08/09/15 0316 08/09/15 0851  TROPONINI 0.04* 0.03* 0.04* 0.03*    Principal Problem:   Gastrointestinal hemorrhage with melena Active Problems:   Atrial fibrillation with RVR (HCC)   Tobacco abuse   Elevated troponin   Acute blood loss anemia   Chronic obstructive pulmonary disease (COPD) ([place])   Chest pain   Time coordinating discharge: 35 minutes  Signed:  [name], MD [facility] 08/12/2015, 5:11 PM  By signing my name below, I, Delene Ruffini, attest that this documentation has been prepared under the direction and in the presence of Ruthanna Macchia P.  Sarajane Jews, MD. Electronically Signed: Delene Ruffini, Scribe.  08/12/2015 11:00am  I personally performed the services described in this documentation. All medical record entries made by the scribe were at my direction. I have reviewed the chart and agree that the record reflects my personal performance and is accurate and complete. [name], MD

## 2015-08-15 ENCOUNTER — Encounter (HOSPITAL_COMMUNITY): Payer: Self-pay | Admitting: Internal Medicine

## 2015-08-22 ENCOUNTER — Telehealth: Payer: Self-pay | Admitting: Orthopaedic Surgery

## 2015-08-22 MED ORDER — HYDROCODONE-ACETAMINOPHEN 5-325 MG PO TABS: 1.0000 | ORAL_TABLET | ORAL | Status: DC | PRN

## 2015-08-22 NOTE — Telephone Encounter (Signed)
Patient called for refill of: HYDROcodone-acetaminophen (NORCO/VICODIN) 5-325 MG tablet [id number] - quantity 120.

## 2015-08-22 NOTE — Telephone Encounter (Signed)
Rx Done . 

## 2015-09-03 ENCOUNTER — Telehealth: Payer: Self-pay | Admitting: Physician Assistant

## 2015-09-03 NOTE — Telephone Encounter (Signed)
Pt told home health nurse that her chest was aching under left breast last night.She has also had heart racing several times.Has afib and is very anxious about getting it fixed.VS today 140/90, HR 84 irregular   She declined to go to ED, told HHN she would if it got worse,apt made for Thursday 2 pm with Kerin Ransom

## 2015-09-03 NOTE — Telephone Encounter (Signed)
Please call patient regarding some symptoms.Marland Kitchen tg

## 2015-09-06 ENCOUNTER — Ambulatory Visit (INDEPENDENT_AMBULATORY_CARE_PROVIDER_SITE_OTHER): Payer: Medicare HMO | Admitting: Cardiology

## 2015-09-06 ENCOUNTER — Encounter: Payer: Self-pay | Admitting: Cardiology

## 2015-09-06 DIAGNOSIS — I4891 Unspecified atrial fibrillation: Secondary | ICD-10-CM | POA: Diagnosis not present

## 2015-09-06 DIAGNOSIS — K921 Melena: Secondary | ICD-10-CM | POA: Diagnosis not present

## 2015-09-06 DIAGNOSIS — Z7901 Long term (current) use of anticoagulants: Secondary | ICD-10-CM | POA: Diagnosis not present

## 2015-09-06 DIAGNOSIS — J42 Unspecified chronic bronchitis: Secondary | ICD-10-CM

## 2015-09-06 MED ORDER — METOPROLOL TARTRATE 25 MG PO TABS: 12.5000 mg | ORAL_TABLET | Freq: Two times a day (BID) | ORAL | 3 refills | Status: DC

## 2015-09-06 NOTE — Assessment & Plan Note (Signed)
Resumed after recent GI bleed. CHADs VASc=3

## 2015-09-06 NOTE — Assessment & Plan Note (Signed)
Still smoking occasionally.

## 2015-09-06 NOTE — Patient Instructions (Signed)
Your physician recommends that you schedule a follow-up appointment in: [place], NP  Your physician has recommended you make the following change in your medication:   Lopressor 12.5 mg Two Times Daily   If you need a refill on your cardiac medications before your next appointment, please call your pharmacy.  Thank you for choosing [facility]!

## 2015-09-06 NOTE — Progress Notes (Signed)
09/06/2015 Lindsay Salazar   [birth year]  [id number]  Primary Physician [name], PA-C Primary Cardiologist: Dr Harl Bowie  HPI:  65 y/o (looks older) unwashed, disheveled Caucasian female with a history of COPD, smoking, and PAF. She had DCCV in June 2017 to NSR with PACs. She was admitted with GI bleeding in July 2017. Telemetry then showed MAT. Xarelto was briefly held but resumed at discharge 08/12/15. She is in the office today for follow up. From her heart standpoint point she seems to be doing well. She says her HR is usually in the 80's. She had other complaints-restless legs and anxiety.    Current Outpatient Prescriptions  Medication Sig Dispense Refill  . albuterol (PROVENTIL) (2.5 MG/3ML) 0.083% nebulizer solution Take 3 mLs (2.5 mg total) by nebulization every 6 (six) hours as needed for wheezing or shortness of breath. 75 mL 12  . albuterol (VENTOLIN HFA) 108 (90 BASE) MCG/ACT inhaler Inhale 2 puffs into the lungs 2 (two) times daily. For shortness of breath    . bismuth-metronidazole-tetracycline (PYLERA) 140-125-125 MG capsule Take 3 capsules by mouth 4 (four) times daily -  before meals and at bedtime. 120 capsule 0  . COMBIVENT RESPIMAT 20-100 MCG/ACT AERS respimat Inhale 1 puff into the lungs 2 (two) times daily.    Marland Kitchen diltiazem (CARDIZEM CD) 120 MG 24 hr capsule Take 1 capsule (120 mg total) by mouth 2 (two) times daily. 60 capsule 6  . escitalopram (LEXAPRO) 10 MG tablet Take 10 mg by mouth every evening.     . furosemide (LASIX) 20 MG tablet Take 1 tablet (20 mg total) by mouth daily as needed for edema. (Patient taking differently: Take 20 mg by mouth daily. ) 90 tablet 3  . HYDROcodone-acetaminophen (NORCO/VICODIN) 5-325 MG tablet Take 1 tablet by mouth every 4 (four) hours as needed for moderate pain (Must last 30 days.  Do not take and drive a car or use machinery.). 120 tablet 0  . ipratropium-albuterol (DUONEB) 0.5-2.5 (3) MG/3ML SOLN Inhale 3 mLs into the lungs every 6  (six) hours as needed (for shortness of breath).     . pantoprazole (PROTONIX) 40 MG tablet Take 1 tablet (40 mg total) by mouth daily. 60 tablet 0  . rivaroxaban (XARELTO) 20 MG TABS tablet Take 1 tablet (20 mg total) by mouth daily with supper. 30 tablet 10  . SYMBICORT 160-4.5 MCG/ACT inhaler Inhale 1 puff into the lungs 2 (two) times daily.     No current facility-administered medications for this visit.     Allergies  Allergen Reactions  . Ibuprofen Nausea And Vomiting  . Penicillins Swelling    Has patient had a PCN reaction causing immediate rash, facial/tongue/throat swelling, SOB or lightheadedness with hypotension: Yes Has patient had a PCN reaction causing severe rash involving mucus membranes or skin necrosis: No Has patient had a PCN reaction that required hospitalization No Has patient had a PCN reaction occurring within the last 10 years: No If all of the above answers are "NO", then may proceed with Cephalosporin use.   . Tiotropium Bromide Monohydrate Itching  . Tramadol Nausea And Vomiting  . Vicodin [Hydrocodone-Acetaminophen] Nausea And Vomiting    Social History   Social History  . Marital status: Divorced    Spouse name: N/A  . Number of children: N/A  . Years of education: N/A   Occupational History  . Not on file.   Social History Main Topics  . Smoking status: Current Every Day Smoker  Packs/day: 0.25    Years: 6.00    Types: Cigarettes  . Smokeless tobacco: Never Used     Comment: quit after 2 cigarettes yesterday (7/4)  . Alcohol use No     Comment: quit 30 years ago  . Drug use: No  . Sexual activity: Yes    Birth control/ protection: Surgical, Post-menopausal   Other Topics Concern  . Not on file   Social History Narrative  . No narrative on file     Review of Systems: General: negative for chills, fever, night sweats or weight changes.  Cardiovascular: negative for chest pain, edema, orthopnea, paroxysmal nocturnal dyspnea or  shortness of breath Dermatological: negative for rash Respiratory: negative for cough or wheezing Urologic: negative for hematuria Abdominal: negative for nausea, vomiting, diarrhea, bright red blood per rectum, melena, or hematemesis Neurologic: negative for visual changes, syncope, or dizziness All other systems reviewed and are otherwise negative except as noted above.    Blood pressure 126/74, pulse 83, height 5\' 4"  (1.626 m), weight 131 lb (59.4 kg), SpO2 98 %.  General appearance: alert, cooperative, appears older than stated age, no distress and poor dentition Lungs: decreased breath sounds, no wheezing Heart: regular rate and rhythm Extremities: no edema Neurologic: Grossly normal  EKG MAT- a few sinus beats, multifocal PACs  ASSESSMENT AND PLAN:   Gastrointestinal hemorrhage with melena Just discharged 08/12/15, back on Xarelto. Appears to be stable.  Atrial fibrillation with RVR (HCC) PAF and MAT. S/P DCCV June 2017.   Chronic obstructive pulmonary disease (COPD) (HCC) Still smoking occasionally.   Chronic anticoagulation-Xarelto Resumed after recent GI bleed. CHADs VASc=3   PLAN  Her rate is fast. She has pretty much stopped smoking and has no wheezing on exam. I added Metoprolol 12.5 mg BID. She should probably come back in a few weeks to be checked. She asked about medication for her restless leg and anxiety and I suggested she contact her PCP. They also need a letter stating she needs home O2 so [employer] won't turn off they're power.   Kerin Ransom PA-C 09/06/2015 3:21 PM

## 2015-09-06 NOTE — Assessment & Plan Note (Signed)
Just discharged 08/12/15, back on Xarelto. Appears to be stable.

## 2015-09-06 NOTE — Assessment & Plan Note (Signed)
PAF and MAT. S/P DCCV June 2017.

## 2015-09-19 ENCOUNTER — Telehealth: Payer: Self-pay | Admitting: Orthopaedic Surgery

## 2015-09-19 MED ORDER — HYDROCODONE-ACETAMINOPHEN 5-325 MG PO TABS: 1.0000 | ORAL_TABLET | ORAL | 0 refills | Status: DC | PRN

## 2015-09-19 NOTE — Telephone Encounter (Signed)
Hydrocodone-Acetaminophen  5/325mg  Qty 120 Tablets °

## 2015-10-02 ENCOUNTER — Ambulatory Visit: Payer: Medicare HMO | Admitting: Cardiology

## 2015-10-10 ENCOUNTER — Ambulatory Visit (INDEPENDENT_AMBULATORY_CARE_PROVIDER_SITE_OTHER): Payer: Medicare HMO | Admitting: Physician Assistant

## 2015-10-10 ENCOUNTER — Encounter: Payer: Self-pay | Admitting: Physician Assistant

## 2015-10-10 VITALS — BP 166/83 | HR 66 | Ht 64.0 in | Wt 131.8 lb

## 2015-10-10 DIAGNOSIS — I4891 Unspecified atrial fibrillation: Secondary | ICD-10-CM | POA: Diagnosis not present

## 2015-10-10 DIAGNOSIS — Z720 Tobacco use: Secondary | ICD-10-CM

## 2015-10-10 DIAGNOSIS — I10 Essential (primary) hypertension: Secondary | ICD-10-CM

## 2015-10-10 NOTE — Progress Notes (Signed)
Cardiology Office Note    Date:  10/10/2015   ID:  Lindsay Salazar, DOB 01/25/1951, MRN [id number]  PCP:  [name], PA-C  Cardiologist: Dr. Harl Bowie   Chief Complaint  Patient presents with  . Follow-up    History of Present Illness:  Lindsay Salazar is a 65 y.o. female  with a history of COPD, smoking, and PAF-CHADSVASC=3. She had DCCV in June 2017 to NSR with PACs. She was admitted with GI bleeding in July 2017On Xarelto at home and also taking frequent NSAIDs-hemoglobin 4.7 on admission. Telemetry then showed MAT with RVR. Xarelto was briefly held but resumed at discharge 08/12/15.She saw Kerin Ransom PA-C 09/06/15 and her heart rate was fast although she was asymptomatic. He added metoprolol 12.5 mg twice a day and ask her to come back for recheck today.  Patient feels much better. Her heart is not racing nearly as much as it had been. If she is moving around a lot and may increase once a week but overall has been much better. She denies any chest pain, dizziness or presyncope. She is now walking. She does complain of some weakness. She admits to have bad body odor because she has 8 dog in her house.     Past Medical History:  Diagnosis Date  . Anginal pain ([place])    thought she was having   heartburn  . Arthritis    bursitis , fibromyalgia  . Asthma   . Atrial fibrillation ([place])   . Chronic right hip pain   . COPD (chronic obstructive pulmonary disease) ([place])    wears home o2 prn  . Fibromyalgia   . GERD (gastroesophageal reflux disease)    use to have it but not now  . Heart murmur    31-  47 years old  . Hypertension   . Pneumothorax     Past Surgical History:  Procedure Laterality Date  . CARDIOVERSION N/A 07/18/2015   Procedure: CARDIOVERSION;  Surgeon: Josue Hector, MD;  [facility];  Service: Cardiovascular;  Laterality: N/A;  . ESOPHAGOGASTRODUODENOSCOPY N/A 08/09/2015   Procedure: ESOPHAGOGASTRODUODENOSCOPY (EGD);  Surgeon: Rogene Houston, MD;  [facility]  [facility];  Service: Endoscopy;  Laterality: N/A;  . NECK SURGERY    . TONSILLECTOMY    . TUBAL LIGATION      Current Medications: Outpatient Medications Prior to Visit  Medication Sig Dispense Refill  . albuterol (PROVENTIL) (2.5 MG/3ML) 0.083% nebulizer solution Take 3 mLs (2.5 mg total) by nebulization every 6 (six) hours as needed for wheezing or shortness of breath. 75 mL 12  . albuterol (VENTOLIN HFA) 108 (90 BASE) MCG/ACT inhaler Inhale 2 puffs into the lungs 2 (two) times daily. For shortness of breath    . bismuth-metronidazole-tetracycline (PYLERA) 140-125-125 MG capsule Take 3 capsules by mouth 4 (four) times daily -  before meals and at bedtime. 120 capsule 0  . COMBIVENT RESPIMAT 20-100 MCG/ACT AERS respimat Inhale 1 puff into the lungs 2 (two) times daily.    Marland Kitchen diltiazem (CARDIZEM CD) 120 MG 24 hr capsule Take 1 capsule (120 mg total) by mouth 2 (two) times daily. 60 capsule 6  . escitalopram (LEXAPRO) 10 MG tablet Take 10 mg by mouth every evening.     . furosemide (LASIX) 20 MG tablet Take 1 tablet (20 mg total) by mouth daily as needed for edema. (Patient taking differently: Take 20 mg by mouth daily. ) 90 tablet 3  . HYDROcodone-acetaminophen (NORCO/VICODIN) 5-325  MG tablet Take 1 tablet by mouth every 4 (four) hours as needed for moderate pain (Must last 30 days.  Do not take and drive a car or use machinery.). 120 tablet 0  . ipratropium-albuterol (DUONEB) 0.5-2.5 (3) MG/3ML SOLN Inhale 3 mLs into the lungs every 6 (six) hours as needed (for shortness of breath).     . metoprolol tartrate (LOPRESSOR) 25 MG tablet Take 0.5 tablets (12.5 mg total) by mouth 2 (two) times daily. 90 tablet 3  . pantoprazole (PROTONIX) 40 MG tablet Take 1 tablet (40 mg total) by mouth daily. 60 tablet 0  . rivaroxaban (XARELTO) 20 MG TABS tablet Take 1 tablet (20 mg total) by mouth daily with supper. 30 tablet 10  . SYMBICORT 160-4.5 MCG/ACT inhaler Inhale 1 puff into the lungs 2 (two) times  daily.     No facility-administered medications prior to visit.      Allergies:   Ibuprofen; Penicillins; Tiotropium bromide monohydrate; Tramadol; and Vicodin [hydrocodone-acetaminophen]   Social History   Social History  . Marital status: Divorced    Spouse name: N/A  . Number of children: N/A  . Years of education: N/A   Social History Main Topics  . Smoking status: Current Every Day Smoker    Packs/day: 0.25    Years: 6.00    Types: Cigarettes    Start date: 10/09/1969  . Smokeless tobacco: Never Used     Comment: quit after 2 cigarettes yesterday (7/4)  . Alcohol use No     Comment: quit 30 years ago  . Drug use: No  . Sexual activity: Yes    Birth control/ protection: Surgical, Post-menopausal   Other Topics Concern  . None   Social History Narrative  . None     Family History:  The patient's   family history includes COPD in her mother; Diabetes in her father; Stroke in her father.   ROS:   Please see the history of present illness.    Review of Systems  Constitution: Positive for weakness and malaise/fatigue.  HENT: Negative.   Eyes: Negative.   Cardiovascular: Positive for palpitations.  Respiratory: Negative.   Hematologic/Lymphatic: Negative.   Musculoskeletal: Negative.  Negative for joint pain.  Gastrointestinal: Negative.   Genitourinary: Negative.    All other systems reviewed and are negative.   PHYSICAL EXAM:   VS:  BP (!) 166/83   Pulse 66   Ht 5\' 4"  (1.626 m)   Wt 131 lb 12.8 oz (59.8 kg)   SpO2 99%   BMI 22.62 kg/m   Physical Exam  GEN: Well nourished, well developed,Disheveled in no acute distress  Neck: no JVD, carotid bruits, or masses Cardiac:RRR; 1/6 systolic murmur at the left sternal border, no rubs, or gallops  Respiratory:  clear to auscultation bilaterally, normal work of breathing GI: soft, nontender, nondistended, + BS Ext: without cyanosis, clubbing, or edema, Good distal pulses bilaterally MS: no deformity or atrophy    Skin: warm and dry, no rash Psych: euthymic mood, full affect  Wt Readings from Last 3 Encounters:  10/10/15 131 lb 12.8 oz (59.8 kg)  09/06/15 131 lb (59.4 kg)  08/08/15 137 lb 2 oz (62.2 kg)      Studies/Labs Reviewed:   EKG:  EKG is not ordered today.    Recent Labs: 06/27/2015: TSH 2.678 07/17/2015: B Natriuretic Peptide 415.0 07/19/2015: ALT 34 08/09/2015: BUN 46; Creatinine, Ser 1.04; Magnesium 1.7; Potassium 3.8; Sodium 135 08/11/2015: Hemoglobin 9.3; Platelets 175   Lipid Panel  No results found for: CHOL, TRIG, HDL, CHOLHDL, VLDL, LDLCALC, LDLDIRECT  Additional studies/ records that were reviewed today include:   Prior Cardiac Testing/Procedures 1. DCCV 07/18/2015   2. Echocardiogram Left ventricle: The cavity size was normal. Wall thickness was   normal. Systolic function was normal. The estimated ejection   fraction was 55%. There is akinesis of the midanteroseptal   myocardium. The study is not technically sufficient to allow   evaluation of LV diastolic function. - Aortic valve: Mildly calcified annulus. Trileaflet. There was   trivial regurgitation. - Mitral valve: Mildly thickened leaflets . There was mild   regurgitation. - Left atrium: The atrium was mildly dilated. - Right ventricle: Systolic function was low normal. - Right atrium: The atrium was mildly dilated. Central venous   pressure (est): 8 mm Hg. - Tricuspid valve: There was moderate regurgitation. - Pulmonary arteries: PA peak pressure: 41 mm Hg (S). - Pericardium, extracardiac: There was no pericardial effusion.      ASSESSMENT:    1. Atrial fibrillation, unspecified type ([place])   2. Essential hypertension   3. Tobacco abuse      PLAN:  In order of problems listed above:  PAF: Heart rate is regular today and rate is controlled. Patient is feeling much better since metoprolol added. Continue current medications including Xarelto. Follow-up with Dr. branch in 3 months.  Essential  hypertension blood pressure is up today but it has been well controlled. She did rush in here. No changes at this time.  Tobacco abuse still smoking a half a pack per day. Recommend smoking cessation.    Medication Adjustments/Labs and Tests Ordered: Current medicines are reviewed at length with the patient today.  Concerns regarding medicines are outlined above.  Medication changes, Labs and Tests ordered today are listed in the Patient Instructions below. Patient Instructions  Medication Instructions:  Your physician recommends that you continue on your current medications as directed. Please refer to the Current Medication list given to you today.   Labwork: none  Testing/Procedures: none  Follow-Up: Your physician recommends that you schedule a follow-up appointment in: 3 months    Any Other Special Instructions Will Be Listed Below (If Applicable).     If you need a refill on your cardiac medications before your next appointment, please call your pharmacy.      Signed, Ermalinda Barrios, PA-C  10/10/2015 [facility] [address], [place], [place]  [address] Phone: [phone]; Fax: [phone]

## 2015-10-10 NOTE — Patient Instructions (Signed)
Medication Instructions:   Your physician recommends that you continue on your current medications as directed. Please refer to the Current Medication list given to you today.  Labwork:  none  Testing/Procedures:  none  Follow-Up:  Your physician recommends that you schedule a follow-up appointment in: 3 months.  Any Other Special Instructions Will Be Listed Below (If Applicable).  If you need a refill on your cardiac medications before your next appointment, please call your pharmacy. 

## 2015-10-18 ENCOUNTER — Telehealth: Payer: Self-pay | Admitting: Orthopaedic Surgery

## 2015-10-18 MED ORDER — HYDROCODONE-ACETAMINOPHEN 5-325 MG PO TABS: 1.0000 | ORAL_TABLET | Freq: Four times a day (QID) | ORAL | 0 refills | Status: DC | PRN

## 2015-10-18 NOTE — Telephone Encounter (Signed)
Patient requests a refill on Hydroodone/Acetamiophen 5-325 mgs.  Qty  120  Sig: Take 1 tablet by mouth every 4 (four) hours as needed for moderate pain (Must last 30 days. Do not take and drive a car or use machinery.).

## 2015-10-24 ENCOUNTER — Encounter: Payer: Self-pay | Admitting: Orthopaedic Surgery

## 2015-10-24 ENCOUNTER — Ambulatory Visit (INDEPENDENT_AMBULATORY_CARE_PROVIDER_SITE_OTHER): Payer: Medicare HMO | Admitting: Orthopaedic Surgery

## 2015-10-24 VITALS — BP 130/84 | HR 90 | Temp 97.5°F | Resp 16 | Ht 64.0 in | Wt 129.0 lb

## 2015-10-24 DIAGNOSIS — M25551 Pain in right hip: Secondary | ICD-10-CM

## 2015-10-24 DIAGNOSIS — F17210 Nicotine dependence, cigarettes, uncomplicated: Secondary | ICD-10-CM | POA: Diagnosis not present

## 2015-10-24 NOTE — Progress Notes (Signed)
Patient ID:Lindsay Salazar, female DOB:[birth year], 65 y.o. [id number]  Chief Complaint  Patient presents with  . Hip Pain    right hip pain recheck     HPI  Lindsay Salazar is a 65 y.o. female who has chronic right hip pain.  She is a little better.  She has no falls or new trauma. She has no redness or swelling.  She uses ice and takes her medicine. HPI  Body mass index is 22.14 kg/m.  ROS  Review of Systems  HENT: Negative for congestion.   Respiratory: Positive for shortness of breath. Negative for cough.   Cardiovascular: Positive for chest pain and leg swelling.  Endocrine: Positive for cold intolerance.  Musculoskeletal: Positive for arthralgias and gait problem.  Allergic/Immunologic: Positive for environmental allergies.    Past Medical History:  Diagnosis Date  . Anginal pain ([place])    thought she was having   heartburn  . Arthritis    bursitis , fibromyalgia  . Asthma   . Atrial fibrillation ([place])   . Chronic right hip pain   . COPD (chronic obstructive pulmonary disease) ([place])    wears home o2 prn  . Fibromyalgia   . GERD (gastroesophageal reflux disease)    use to have it but not now  . Heart murmur    78-  18 years old  . Hypertension   . Pneumothorax     Past Surgical History:  Procedure Laterality Date  . CARDIOVERSION N/A 07/18/2015   Procedure: CARDIOVERSION;  Surgeon: Josue Hector, MD;  [facility];  Service: Cardiovascular;  Laterality: N/A;  . ESOPHAGOGASTRODUODENOSCOPY N/A 08/09/2015   Procedure: ESOPHAGOGASTRODUODENOSCOPY (EGD);  Surgeon: Rogene Houston, MD;  [facility];  Service: Endoscopy;  Laterality: N/A;  . NECK SURGERY    . TONSILLECTOMY    . TUBAL LIGATION      Family History  Problem Relation Age of Onset  . COPD Mother   . Diabetes Father   . Stroke Father     Social History Social History  Substance Use Topics  . Smoking status: Current Every Day Smoker    Packs/day: 0.25    Years: 6.00    Types:  Cigarettes    Start date: 10/09/1969  . Smokeless tobacco: Never Used     Comment: quit after 2 cigarettes yesterday (7/4)  . Alcohol use No     Comment: quit 30 years ago    Allergies  Allergen Reactions  . Ibuprofen Nausea And Vomiting  . Penicillins Swelling    Has patient had a PCN reaction causing immediate rash, facial/tongue/throat swelling, SOB or lightheadedness with hypotension: Yes Has patient had a PCN reaction causing severe rash involving mucus membranes or skin necrosis: No Has patient had a PCN reaction that required hospitalization No Has patient had a PCN reaction occurring within the last 10 years: No If all of the above answers are "NO", then may proceed with Cephalosporin use.   . Tiotropium Bromide Monohydrate Itching  . Tramadol Nausea And Vomiting  . Vicodin [Hydrocodone-Acetaminophen] Nausea And Vomiting    Current Outpatient Prescriptions  Medication Sig Dispense Refill  . albuterol (PROVENTIL) (2.5 MG/3ML) 0.083% nebulizer solution Take 3 mLs (2.5 mg total) by nebulization every 6 (six) hours as needed for wheezing or shortness of breath. 75 mL 12  . albuterol (VENTOLIN HFA) 108 (90 BASE) MCG/ACT inhaler Inhale 2 puffs into the lungs 2 (two) times daily. For shortness of breath    . bismuth-metronidazole-tetracycline (  PYLERA) 140-125-125 MG capsule Take 3 capsules by mouth 4 (four) times daily -  before meals and at bedtime. 120 capsule 0  . COMBIVENT RESPIMAT 20-100 MCG/ACT AERS respimat Inhale 1 puff into the lungs 2 (two) times daily.    Marland Kitchen diltiazem (CARDIZEM CD) 120 MG 24 hr capsule Take 1 capsule (120 mg total) by mouth 2 (two) times daily. 60 capsule 6  . escitalopram (LEXAPRO) 10 MG tablet Take 10 mg by mouth every evening.     . furosemide (LASIX) 20 MG tablet Take 1 tablet (20 mg total) by mouth daily as needed for edema. (Patient taking differently: Take 20 mg by mouth daily. ) 90 tablet 3  . HYDROcodone-acetaminophen (NORCO/VICODIN) 5-325 MG tablet  Take 1 tablet by mouth every 6 (six) hours as needed for moderate pain (Must last 30 days.Do not take and drive a car or use machinery.). 110 tablet 0  . ipratropium-albuterol (DUONEB) 0.5-2.5 (3) MG/3ML SOLN Inhale 3 mLs into the lungs every 6 (six) hours as needed (for shortness of breath).     . metoprolol tartrate (LOPRESSOR) 25 MG tablet Take 0.5 tablets (12.5 mg total) by mouth 2 (two) times daily. 90 tablet 3  . pantoprazole (PROTONIX) 40 MG tablet Take 1 tablet (40 mg total) by mouth daily. 60 tablet 0  . rivaroxaban (XARELTO) 20 MG TABS tablet Take 1 tablet (20 mg total) by mouth daily with supper. 30 tablet 10  . SYMBICORT 160-4.5 MCG/ACT inhaler Inhale 1 puff into the lungs 2 (two) times daily.     No current facility-administered medications for this visit.      Physical Exam  Blood pressure 130/84, pulse 90, temperature 97.5 F (36.4 C), resp. rate 16, height 5\' 4"  (1.626 m), weight 129 lb (58.5 kg).  Constitutional: overall normal hygiene, normal nutrition, well developed, normal grooming, normal body habitus. Assistive device:none  Musculoskeletal: gait and station Limp none, muscle tone and strength are normal, no tremors or atrophy is present.  .  Neurological: coordination overall normal.  Deep tendon reflex/nerve stretch intact.  Sensation normal.  Cranial nerves II-XII intact.   Skin:   Normal overall no scars, lesions, ulcers or rashes. No psoriasis.  Psychiatric: Alert and oriented x 3.  Recent memory intact, remote memory unclear.  Normal mood and affect. Well groomed.  Good eye contact.  Cardiovascular: overall no swelling, no varicosities, no edema bilaterally, normal temperatures of the legs and arms, no clubbing, cyanosis and good capillary refill.  Lymphatic: palpation is normal.  She has tenderness of the right hip with tenderness of the lateral trochanteric area.  She has no swelling, no redness. ROM is full.    She smokes.  I have talked to her about  this. She is willing to quit.  She has significant heart disease and I told her that it can make that worse.    The patient has been educated about the nature of the problem(s) and counseled on treatment options.  The patient appeared to understand what I have discussed and is in agreement with it.  Encounter Diagnoses  Name Primary?  . Right hip pain Yes  . Cigarette nicotine dependence without complication     PLAN Call if any problems.  Precautions discussed.  Continue current medications.   Return to clinic 3 months   Electronically Signed Sanjuana Kava, MD 9/20/20179:36 AM

## 2015-11-15 ENCOUNTER — Telehealth: Payer: Self-pay | Admitting: Orthopaedic Surgery

## 2015-11-15 MED ORDER — HYDROCODONE-ACETAMINOPHEN 5-325 MG PO TABS: 1.0000 | ORAL_TABLET | Freq: Four times a day (QID) | ORAL | 0 refills | Status: DC | PRN

## 2015-11-15 NOTE — Telephone Encounter (Signed)
Call per patient for refill:  HYDROcodone-acetaminophen (NORCO/VICODIN) 5-325 MG tablet    - insurance: [employer]

## 2015-11-27 ENCOUNTER — Inpatient Hospital Stay (HOSPITAL_COMMUNITY)
Admission: EM | Admit: 2015-11-27 | Discharge: 2015-12-03 | DRG: 435 | Disposition: A | Discharge status: Home / Self Care | Payer: Medicare HMO | Attending: Internal Medicine | Admitting: Internal Medicine

## 2015-11-27 ENCOUNTER — Encounter (HOSPITAL_COMMUNITY): Payer: Self-pay | Admitting: *Deleted

## 2015-11-27 ENCOUNTER — Emergency Department (HOSPITAL_COMMUNITY): Payer: Medicare HMO

## 2015-11-27 DIAGNOSIS — R0789 Other chest pain: Secondary | ICD-10-CM | POA: Diagnosis present

## 2015-11-27 DIAGNOSIS — R64 Cachexia: Secondary | ICD-10-CM | POA: Diagnosis present

## 2015-11-27 DIAGNOSIS — M797 Fibromyalgia: Secondary | ICD-10-CM | POA: Diagnosis present

## 2015-11-27 DIAGNOSIS — J440 Chronic obstructive pulmonary disease with acute lower respiratory infection: Secondary | ICD-10-CM | POA: Diagnosis present

## 2015-11-27 DIAGNOSIS — Z79899 Other long term (current) drug therapy: Secondary | ICD-10-CM

## 2015-11-27 DIAGNOSIS — R011 Cardiac murmur, unspecified: Secondary | ICD-10-CM | POA: Diagnosis present

## 2015-11-27 DIAGNOSIS — J9811 Atelectasis: Secondary | ICD-10-CM | POA: Diagnosis present

## 2015-11-27 DIAGNOSIS — Z9981 Dependence on supplemental oxygen: Secondary | ICD-10-CM | POA: Diagnosis not present

## 2015-11-27 DIAGNOSIS — F17210 Nicotine dependence, cigarettes, uncomplicated: Secondary | ICD-10-CM | POA: Diagnosis present

## 2015-11-27 DIAGNOSIS — J189 Pneumonia, unspecified organism: Secondary | ICD-10-CM | POA: Diagnosis present

## 2015-11-27 DIAGNOSIS — K8689 Other specified diseases of pancreas: Secondary | ICD-10-CM | POA: Diagnosis present

## 2015-11-27 DIAGNOSIS — R51 Headache: Secondary | ICD-10-CM | POA: Diagnosis not present

## 2015-11-27 DIAGNOSIS — E876 Hypokalemia: Secondary | ICD-10-CM | POA: Diagnosis present

## 2015-11-27 DIAGNOSIS — R6510 Systemic inflammatory response syndrome (SIRS) of non-infectious origin without acute organ dysfunction: Secondary | ICD-10-CM | POA: Diagnosis present

## 2015-11-27 DIAGNOSIS — D72829 Elevated white blood cell count, unspecified: Secondary | ICD-10-CM | POA: Diagnosis not present

## 2015-11-27 DIAGNOSIS — R1013 Epigastric pain: Secondary | ICD-10-CM

## 2015-11-27 DIAGNOSIS — Z7901 Long term (current) use of anticoagulants: Secondary | ICD-10-CM | POA: Diagnosis not present

## 2015-11-27 DIAGNOSIS — Z9989 Dependence on other enabling machines and devices: Secondary | ICD-10-CM

## 2015-11-27 DIAGNOSIS — J209 Acute bronchitis, unspecified: Secondary | ICD-10-CM | POA: Diagnosis present

## 2015-11-27 DIAGNOSIS — I4892 Unspecified atrial flutter: Secondary | ICD-10-CM | POA: Diagnosis present

## 2015-11-27 DIAGNOSIS — R591 Generalized enlarged lymph nodes: Secondary | ICD-10-CM | POA: Diagnosis not present

## 2015-11-27 DIAGNOSIS — Z886 Allergy status to analgesic agent status: Secondary | ICD-10-CM

## 2015-11-27 DIAGNOSIS — Z8701 Personal history of pneumonia (recurrent): Secondary | ICD-10-CM

## 2015-11-27 DIAGNOSIS — I10 Essential (primary) hypertension: Secondary | ICD-10-CM | POA: Diagnosis present

## 2015-11-27 DIAGNOSIS — K219 Gastro-esophageal reflux disease without esophagitis: Secondary | ICD-10-CM | POA: Diagnosis not present

## 2015-11-27 DIAGNOSIS — Z836 Family history of other diseases of the respiratory system: Secondary | ICD-10-CM

## 2015-11-27 DIAGNOSIS — Z780 Asymptomatic menopausal state: Secondary | ICD-10-CM

## 2015-11-27 DIAGNOSIS — I480 Paroxysmal atrial fibrillation: Secondary | ICD-10-CM | POA: Diagnosis present

## 2015-11-27 DIAGNOSIS — R000 Tachycardia, unspecified: Secondary | ICD-10-CM | POA: Diagnosis present

## 2015-11-27 DIAGNOSIS — I481 Persistent atrial fibrillation: Secondary | ICD-10-CM | POA: Diagnosis not present

## 2015-11-27 DIAGNOSIS — D509 Iron deficiency anemia, unspecified: Secondary | ICD-10-CM | POA: Diagnosis present

## 2015-11-27 DIAGNOSIS — I4891 Unspecified atrial fibrillation: Secondary | ICD-10-CM | POA: Diagnosis present

## 2015-11-27 DIAGNOSIS — M1611 Unilateral primary osteoarthritis, right hip: Secondary | ICD-10-CM | POA: Diagnosis present

## 2015-11-27 DIAGNOSIS — R079 Chest pain, unspecified: Secondary | ICD-10-CM | POA: Diagnosis not present

## 2015-11-27 DIAGNOSIS — Z595 Extreme poverty: Secondary | ICD-10-CM

## 2015-11-27 DIAGNOSIS — Z8249 Family history of ischemic heart disease and other diseases of the circulatory system: Secondary | ICD-10-CM

## 2015-11-27 DIAGNOSIS — Z825 Family history of asthma and other chronic lower respiratory diseases: Secondary | ICD-10-CM

## 2015-11-27 DIAGNOSIS — Z8711 Personal history of peptic ulcer disease: Secondary | ICD-10-CM

## 2015-11-27 DIAGNOSIS — K869 Disease of pancreas, unspecified: Secondary | ICD-10-CM | POA: Diagnosis not present

## 2015-11-27 DIAGNOSIS — Z9114 Patient's other noncompliance with medication regimen: Secondary | ICD-10-CM

## 2015-11-27 DIAGNOSIS — Z885 Allergy status to narcotic agent status: Secondary | ICD-10-CM

## 2015-11-27 DIAGNOSIS — J42 Unspecified chronic bronchitis: Secondary | ICD-10-CM | POA: Diagnosis not present

## 2015-11-27 DIAGNOSIS — C251 Malignant neoplasm of body of pancreas: Secondary | ICD-10-CM | POA: Diagnosis present

## 2015-11-27 DIAGNOSIS — M810 Age-related osteoporosis without current pathological fracture: Secondary | ICD-10-CM | POA: Diagnosis present

## 2015-11-27 DIAGNOSIS — C7989 Secondary malignant neoplasm of other specified sites: Secondary | ICD-10-CM | POA: Diagnosis not present

## 2015-11-27 DIAGNOSIS — Z981 Arthrodesis status: Secondary | ICD-10-CM

## 2015-11-27 DIAGNOSIS — Z823 Family history of stroke: Secondary | ICD-10-CM

## 2015-11-27 DIAGNOSIS — Z7951 Long term (current) use of inhaled steroids: Secondary | ICD-10-CM

## 2015-11-27 DIAGNOSIS — R634 Abnormal weight loss: Secondary | ICD-10-CM | POA: Diagnosis not present

## 2015-11-27 DIAGNOSIS — Z833 Family history of diabetes mellitus: Secondary | ICD-10-CM

## 2015-11-27 DIAGNOSIS — Z201 Contact with and (suspected) exposure to tuberculosis: Secondary | ICD-10-CM | POA: Diagnosis present

## 2015-11-27 DIAGNOSIS — I482 Chronic atrial fibrillation: Secondary | ICD-10-CM | POA: Diagnosis not present

## 2015-11-27 DIAGNOSIS — C259 Malignant neoplasm of pancreas, unspecified: Secondary | ICD-10-CM

## 2015-11-27 DIAGNOSIS — Z880 Allergy status to penicillin: Secondary | ICD-10-CM

## 2015-11-27 DIAGNOSIS — C250 Malignant neoplasm of head of pancreas: Secondary | ICD-10-CM | POA: Diagnosis not present

## 2015-11-27 HISTORY — DX: Dependence on supplemental oxygen: Z99.81

## 2015-11-27 HISTORY — DX: Headache, unspecified: R51.9

## 2015-11-27 HISTORY — DX: Unspecified chronic bronchitis: J42

## 2015-11-27 HISTORY — DX: Contact with and (suspected) exposure to tuberculosis: Z20.1

## 2015-11-27 HISTORY — DX: Pneumonia, unspecified organism: J18.9

## 2015-11-27 HISTORY — DX: Headache: R51

## 2015-11-27 LAB — LIPASE, BLOOD: LIPASE: 19 U/L (ref 11–51)

## 2015-11-27 LAB — CBC
HEMATOCRIT: 38.3 % (ref 36.0–46.0)
Hemoglobin: 12.1 g/dL (ref 12.0–15.0)
MCH: 25.1 pg — AB (ref 26.0–34.0)
MCHC: 31.6 g/dL (ref 30.0–36.0)
MCV: 79.5 fL (ref 78.0–100.0)
PLATELETS: 267 10*3/uL (ref 150–400)
RBC: 4.82 MIL/uL (ref 3.87–5.11)
RDW: 18.2 % — AB (ref 11.5–15.5)
WBC: 24.5 10*3/uL — ABNORMAL HIGH (ref 4.0–10.5)

## 2015-11-27 LAB — URINE MICROSCOPIC-ADD ON

## 2015-11-27 LAB — URINALYSIS, ROUTINE W REFLEX MICROSCOPIC
BILIRUBIN URINE: NEGATIVE
GLUCOSE, UA: NEGATIVE mg/dL
KETONES UR: NEGATIVE mg/dL
Leukocytes, UA: NEGATIVE
Nitrite: NEGATIVE
PH: 6 (ref 5.0–8.0)
Specific Gravity, Urine: <1.005 — ABNORMAL LOW (ref 1.005–1.030)

## 2015-11-27 LAB — COMPREHENSIVE METABOLIC PANEL
ALBUMIN: 3.4 g/dL — AB (ref 3.5–5.0)
ALT: 26 U/L (ref 14–54)
AST: 19 U/L (ref 15–41)
Alkaline Phosphatase: 92 U/L (ref 38–126)
Anion gap: 8 (ref 5–15)
BUN: 22 mg/dL — AB (ref 6–20)
CHLORIDE: 104 mmol/L (ref 101–111)
CO2: 26 mmol/L (ref 22–32)
Calcium: 8.7 mg/dL — ABNORMAL LOW (ref 8.9–10.3)
Creatinine, Ser: 1.06 mg/dL — ABNORMAL HIGH (ref 0.44–1.00)
GFR calc Af Amer: >60 mL/min (ref >60)
GFR calc non Af Amer: 53 mL/min — ABNORMAL LOW (ref >60)
GLUCOSE: 128 mg/dL — AB (ref 65–99)
POTASSIUM: 3.3 mmol/L — AB (ref 3.5–5.1)
SODIUM: 138 mmol/L (ref 135–145)
Total Bilirubin: 0.5 mg/dL (ref 0.3–1.2)
Total Protein: 6.9 g/dL (ref 6.5–8.1)

## 2015-11-27 LAB — TROPONIN I: Troponin I: <0.03 ng/mL (ref <0.03)

## 2015-11-27 MED ORDER — ALBUTEROL SULFATE (2.5 MG/3ML) 0.083% IN NEBU
2.5000 mg | INHALATION_SOLUTION | Freq: Once | RESPIRATORY_TRACT | Status: AC
  Administered 2015-11-27: 2.5 mg via RESPIRATORY_TRACT
  Filled 2015-11-27: qty 3

## 2015-11-27 MED ORDER — ALBUTEROL SULFATE (2.5 MG/3ML) 0.083% IN NEBU
2.5000 mg | INHALATION_SOLUTION | RESPIRATORY_TRACT | Status: DC | PRN
  Administered 2015-11-27: 2.5 mg via RESPIRATORY_TRACT
  Filled 2015-11-27: qty 3

## 2015-11-27 MED ORDER — POTASSIUM CHLORIDE 10 MEQ/100ML IV SOLN
10.0000 meq | INTRAVENOUS | Status: AC
  Administered 2015-11-27 – 2015-11-28 (×4): 10 meq via INTRAVENOUS
  Filled 2015-11-27 (×5): qty 100

## 2015-11-27 MED ORDER — IOPAMIDOL (ISOVUE-300) INJECTION 61%
100.0000 mL | Freq: Once | INTRAVENOUS | Status: AC | PRN
  Administered 2015-11-27: 100 mL via INTRAVENOUS

## 2015-11-27 MED ORDER — IPRATROPIUM-ALBUTEROL 0.5-2.5 (3) MG/3ML IN SOLN
3.0000 mL | Freq: Three times a day (TID) | RESPIRATORY_TRACT | Status: DC
  Administered 2015-11-28 – 2015-12-03 (×15): 3 mL via RESPIRATORY_TRACT
  Filled 2015-11-27 (×15): qty 3

## 2015-11-27 MED ORDER — ESCITALOPRAM OXALATE 10 MG PO TABS
10.0000 mg | ORAL_TABLET | Freq: Every evening | ORAL | Status: DC
  Administered 2015-11-28 – 2015-12-02 (×6): 10 mg via ORAL
  Filled 2015-11-27 (×6): qty 1

## 2015-11-27 MED ORDER — IOPAMIDOL (ISOVUE-300) INJECTION 61%
INTRAVENOUS | Status: AC
  Administered 2015-11-27: 30 mL
  Filled 2015-11-27: qty 50

## 2015-11-27 MED ORDER — DILTIAZEM HCL ER COATED BEADS 120 MG PO CP24
120.0000 mg | ORAL_CAPSULE | Freq: Two times a day (BID) | ORAL | Status: DC
  Administered 2015-11-27 – 2015-11-28 (×2): 120 mg via ORAL
  Filled 2015-11-27 (×2): qty 1

## 2015-11-27 MED ORDER — MOMETASONE FURO-FORMOTEROL FUM 200-5 MCG/ACT IN AERO
2.0000 | INHALATION_SPRAY | Freq: Two times a day (BID) | RESPIRATORY_TRACT | Status: DC
  Administered 2015-11-28 – 2015-12-03 (×10): 2 via RESPIRATORY_TRACT
  Filled 2015-11-27 (×2): qty 8.8

## 2015-11-27 MED ORDER — NICOTINE 14 MG/24HR TD PT24
14.0000 mg | MEDICATED_PATCH | Freq: Every day | TRANSDERMAL | Status: DC
  Administered 2015-11-27 – 2015-12-03 (×7): 14 mg via TRANSDERMAL
  Filled 2015-11-27 (×7): qty 1

## 2015-11-27 MED ORDER — LORAZEPAM 2 MG/ML IJ SOLN
1.0000 mg | Freq: Once | INTRAMUSCULAR | Status: AC
  Administered 2015-11-27: 1 mg via INTRAVENOUS
  Filled 2015-11-27: qty 1

## 2015-11-27 MED ORDER — HYDROCODONE-ACETAMINOPHEN 5-325 MG PO TABS
1.0000 | ORAL_TABLET | Freq: Four times a day (QID) | ORAL | Status: DC | PRN
  Administered 2015-11-27 – 2015-12-03 (×20): 1 via ORAL
  Filled 2015-11-27 (×21): qty 1

## 2015-11-27 MED ORDER — PANTOPRAZOLE SODIUM 40 MG PO TBEC: 40.0000 mg | DELAYED_RELEASE_TABLET | Freq: Every day | ORAL | Status: DC

## 2015-11-27 MED ORDER — IPRATROPIUM-ALBUTEROL 0.5-2.5 (3) MG/3ML IN SOLN
3.0000 mL | Freq: Four times a day (QID) | RESPIRATORY_TRACT | Status: DC
Start: 2015-11-27 — End: 2015-11-27
  Administered 2015-11-27: 3 mL via RESPIRATORY_TRACT

## 2015-11-27 MED ORDER — GI COCKTAIL ~~LOC~~
30.0000 mL | Freq: Once | ORAL | Status: AC
  Administered 2015-11-27: 30 mL via ORAL
  Filled 2015-11-27: qty 30

## 2015-11-27 MED ORDER — MORPHINE SULFATE (PF) 2 MG/ML IV SOLN
2.0000 mg | INTRAVENOUS | Status: DC | PRN
  Administered 2015-11-27: 2 mg via INTRAVENOUS
  Filled 2015-11-27: qty 1

## 2015-11-27 MED ORDER — ONDANSETRON HCL 4 MG PO TABS: 4.0000 mg | ORAL_TABLET | Freq: Four times a day (QID) | ORAL | Status: DC | PRN

## 2015-11-27 MED ORDER — ONDANSETRON HCL 4 MG/2ML IJ SOLN: 4.0000 mg | Freq: Four times a day (QID) | INTRAMUSCULAR | Status: DC | PRN

## 2015-11-27 MED ORDER — ACETAMINOPHEN 650 MG RE SUPP: 650.0000 mg | Freq: Four times a day (QID) | RECTAL | Status: DC | PRN

## 2015-11-27 MED ORDER — HEPARIN (PORCINE) IN NACL 100-0.45 UNIT/ML-% IJ SOLN
1000.0000 [IU]/h | INTRAMUSCULAR | Status: DC
  Administered 2015-11-27: 850 [IU]/h via INTRAVENOUS
  Administered 2015-11-29: 1000 [IU]/h via INTRAVENOUS
  Filled 2015-11-27 (×2): qty 250

## 2015-11-27 MED ORDER — IOPAMIDOL (ISOVUE-300) INJECTION 61%
INTRAVENOUS | Status: AC
  Filled 2015-11-27: qty 30

## 2015-11-27 MED ORDER — ACETAMINOPHEN 325 MG PO TABS: 650.0000 mg | ORAL_TABLET | Freq: Four times a day (QID) | ORAL | Status: DC | PRN

## 2015-11-27 MED ORDER — METOPROLOL TARTRATE 12.5 MG HALF TABLET
12.5000 mg | ORAL_TABLET | Freq: Two times a day (BID) | ORAL | Status: DC
  Administered 2015-11-27 – 2015-11-28 (×2): 12.5 mg via ORAL
  Filled 2015-11-27 (×2): qty 1

## 2015-11-27 NOTE — ED Provider Notes (Signed)
[facility] DEPT Provider Note   CSN: [id number] Arrival date & time: 11/27/15  1123     History   Chief Complaint Chief Complaint  Patient presents with  . Abdominal Pain    HPI Lindsay Salazar is a 65 year old woman with history of pAF on Xarelto, previously had DCCV in June 2017, COPD, GERD, fibromyalgia, HTN.  HPI   She presents with abdominal pain that started last night around 9PM. Located in epigastric region. Feels like burning. Constant last night but now intermittent. Radiates into chest. She has not taken any medications for this. No relieving or exacerbating factors. Associated diaphoresis but no nausea. Denies melena or hematochezia. She last ate last night around 8:30pm. She has acid reflux. She has not taken any medications for acid reflux. Feels similar to pain she had before when she had a peptic ulcer. Less severe this time. No sick contacts.   Mother had MI in her 38s. Maternal aunt had MI in her 76s.   She was hospitalized 08/08/2015 to 08/12/2015 for upper GI bleed secondary to PUD. She required 2u of pRBC. She was also found to be H pylori positive. She was discharged with pantoprazole and Pylera.   Past Medical History:  Diagnosis Date  . Anginal pain ([place])    thought she was having   heartburn  . Arthritis    bursitis , fibromyalgia  . Asthma   . Atrial fibrillation ([place])   . Chronic right hip pain   . COPD (chronic obstructive pulmonary disease) ([place])    wears home o2 prn  . Fibromyalgia   . GERD (gastroesophageal reflux disease)    use to have it but not now  . Heart murmur    24-  52 years old  . Hypertension   . Pneumothorax     Patient Active Problem List   Diagnosis Date Noted  . Chronic anticoagulation-Xarelto 09/06/2015  . Gastrointestinal hemorrhage with melena 08/08/2015  . Acute blood loss anemia 08/08/2015  . Chronic obstructive pulmonary disease (COPD) ([place]) 08/08/2015  . Chest pain 08/08/2015  . Dyspnea   . Elevated troponin I  level 07/17/2015  . Hyperglycemia 07/17/2015  . Anemia 07/17/2015  . Atrial fibrillation ([place]) 07/17/2015  . COPD exacerbation ([place])   . Atrial flutter ([place]) 06/27/2015  . Atrial fibrillation with RVR ([place]) 06/27/2015  . COPD with exacerbation ([place]) 06/27/2015  . Hypertensive urgency 06/27/2015  . Tobacco abuse 06/27/2015  . Elevated troponin 06/27/2015  . CAP (community acquired pneumonia)   . OSTEOPOROSIS 04/10/2009  . WEIGHT LOSS 04/10/2009    Past Surgical History:  Procedure Laterality Date  . CARDIOVERSION N/A 07/18/2015   Procedure: CARDIOVERSION;  Surgeon: Josue Hector, MD;  Location: [facility];  Service: Cardiovascular;  Laterality: N/A;  . ESOPHAGOGASTRODUODENOSCOPY N/A 08/09/2015   Procedure: ESOPHAGOGASTRODUODENOSCOPY (EGD);  Surgeon: Rogene Houston, MD;  Location: [facility];  Service: Endoscopy;  Laterality: N/A;  . NECK SURGERY    . TONSILLECTOMY    . TUBAL LIGATION      OB History    No data available       Home Medications    Prior to Admission medications   Medication Sig Start Date End Date Taking? Authorizing Provider  albuterol (PROVENTIL) (2.5 MG/3ML) 0.083% nebulizer solution Take 3 mLs (2.5 mg total) by nebulization every 6 (six) hours as needed for wheezing or shortness of breath. 07/19/15  Yes Reyne Dumas, MD  diltiazem (CARDIZEM CD) 120 MG 24 hr capsule Take 1  capsule (120 mg total) by mouth 2 (two) times daily. 07/06/15  Yes Arnoldo Lenis, MD  HYDROcodone-acetaminophen (NORCO/VICODIN) 5-325 MG tablet Take 1 tablet by mouth every 6 (six) hours as needed for moderate pain (Must last 30 days.Do not take and drive a car or use machinery.). 11/15/15  Yes Sanjuana Kava, MD  ipratropium-albuterol (DUONEB) 0.5-2.5 (3) MG/3ML SOLN Inhale 3 mLs into the lungs every 6 (six) hours as needed (for shortness of breath).  08/03/15  Yes Historical Provider, MD  metoprolol tartrate (LOPRESSOR) 25 MG tablet Take 0.5 tablets (12.5 mg total) by mouth 2 (two)  times daily. 09/06/15 12/05/15 Yes Luke K Kilroy, PA-C  rivaroxaban (XARELTO) 20 MG TABS tablet Take 1 tablet (20 mg total) by mouth daily with supper. 07/19/15  Yes Reyne Dumas, MD  bismuth-metronidazole-tetracycline (PYLERA) [phone] MG capsule Take 3 capsules by mouth 4 (four) times daily -  before meals and at bedtime. Patient not taking: Reported on 11/27/2015 08/12/15   Samuella Cota, MD  COMBIVENT RESPIMAT 20-100 MCG/ACT AERS respimat Inhale 1 puff into the lungs 2 (two) times daily. 08/03/15   Historical Provider, MD  escitalopram (LEXAPRO) 10 MG tablet Take 10 mg by mouth every evening.     Historical Provider, MD  furosemide (LASIX) 20 MG tablet Take 1 tablet (20 mg total) by mouth daily as needed for edema. Patient taking differently: Take 20 mg by mouth daily as needed for fluid or edema.  07/06/15   Arnoldo Lenis, MD  pantoprazole (PROTONIX) 40 MG tablet Take 1 tablet (40 mg total) by mouth daily. 08/12/15   Samuella Cota, MD  SYMBICORT 160-4.5 MCG/ACT inhaler Inhale 1 puff into the lungs 2 (two) times daily. 07/06/15   Historical Provider, MD    Family History Family History  Problem Relation Age of Onset  . COPD Mother   . Diabetes Father   . Stroke Father     Social History Social History  Substance Use Topics  . Smoking status: Current Every Day Smoker    Packs/day: 0.50    Years: 46.00    Types: Cigarettes    Start date: 10/09/1969  . Smokeless tobacco: Never Used  . Alcohol use No     Comment: quit 30 years ago     Allergies   Ibuprofen; Penicillins; Tiotropium bromide monohydrate; Tramadol; and Vicodin [hydrocodone-acetaminophen]   Review of Systems Review of Systems Constitutional: no fevers/chills Eyes: no vision changes Ears, nose, mouth, throat, and face: +cough, productive of clear milky sputum Respiratory: +occasional shortness of breath Cardiovascular: +chest pain Gastrointestinal: no nausea/vomiting, +abdominal pain, no constipation, no  diarrhea Genitourinary: no dysuria, no hematuria Integument: no rash Hematologic/lymphatic: no bleeding/bruising, no edema Musculoskeletal: +chronic arthralgias, no myalgias Neurological: no paresthesias, no weakness   Physical Exam Updated Vital Signs BP 119/55   Pulse 111   Temp 97.8 F (36.6 C) (Oral)   Resp (!) 27   Ht 5\' 4"  (1.626 m)   Wt 59.4 kg   SpO2 96%   BMI 22.49 kg/m   Physical Exam General Apperance: NAD Head: Normocephalic, atraumatic Eyes: PERRL, EOMI, anicteric sclera Ears: Normal external ear canal Nose: Nares normal, septum midline, mucosa normal Throat: Lips, mucosa and tongue normal  Neck: Supple, trachea midline Back: No tenderness or bony abnormality  Lungs: Clear to auscultation bilaterally. No wheezes, rhonchi or rales. Breathing comfortably Chest Wall: Nontender, no deformity Heart: Regular rate and rhythm, no murmur/rub/gallop Abdomen: Soft, tender to palpation left abdomen, RLQ, nondistended, no rebound, voluntary  guarding Extremities: Normal, atraumatic, warm and well perfused, no edema Pulses: 2+ throughout Skin: No rashes or lesions Neurologic: Alert and oriented x 3. CNII-XII intact. Normal strength and sensation  ED Treatments / Results  Labs (all labs ordered are listed, but only abnormal results are displayed) Labs Reviewed  COMPREHENSIVE METABOLIC PANEL - Abnormal; Notable for the following:       Result Value   Potassium 3.3 (*)    Glucose, Bld 128 (*)    BUN 22 (*)    Creatinine, Ser 1.06 (*)    Calcium 8.7 (*)    Albumin 3.4 (*)    GFR calc non Af Amer 53 (*)    All other components within normal limits  CBC - Abnormal; Notable for the following:    WBC 24.5 (*)    MCH 25.1 (*)    RDW 18.2 (*)    All other components within normal limits  LIPASE, BLOOD  TROPONIN I  URINALYSIS, ROUTINE W REFLEX MICROSCOPIC (NOT AT [facility])    EKG  EKG Interpretation  Date/Time:  Tuesday November 27 2015 11:47:49 EDT Ventricular Rate:    113 PR Interval:    QRS Duration: 88 QT Interval:  348 QTC Calculation: 478 R Axis:   46 Text Interpretation:  Sinus tachycardia with irregular rate, possible Afib  Anteroseptal infarct, old Repol abnrm suggests ischemia, anterolateral Baseline wander in lead(s) V6 Nonspecific ST abnormality Confirmed by Wyvonnia Dusky  MD, STEPHEN [phone]) on 11/27/2015 12:35:47 PM       Radiology Ct Abdomen Pelvis W Contrast  Result Date: 11/27/2015 CLINICAL DATA:  Abdominal pain.  History of GI bleeding. EXAM: CT ABDOMEN AND PELVIS WITH CONTRAST TECHNIQUE: Multidetector CT imaging of the abdomen and pelvis was performed using the standard protocol following bolus administration of intravenous contrast. CONTRAST:  100 cc Isovue-300 intravenous COMPARISON:  None. FINDINGS: Lower chest:  No nodules. Hepatobiliary: Simple appearing cysts in the inferior left liver measuring nearly 7 cm. Numerous other low-density areas are well-defined and likely cysts or hamartomas, but limited by small size. No definitive liver metastasis. Pancreas: 32 mm long, 25 mm thick hypoenhancing mass in the pancreatic body with ductal obstruction and parenchymal atrophy seen at the level of the tail. On sagittal reformats the mass encases the mid splenic artery and splenic vein with mild splenic vein narrowing 1-2 cm beyond the portal vein confluence. No infiltration seen around the celiac axis or SMA. No peripancreatic lymph nodes. Spleen: Unremarkable. Adrenals/Urinary Tract: Negative adrenals. No hydronephrosis or stone. Unremarkable bladder. Stomach/Bowel:  No obstruction. No appendicitis. Vascular/Lymphatic: No acute vascular abnormality. Splenic vascular findings described on pancreas section. Atherosclerosis. No mass or adenopathy. Reproductive:No pathologic findings. Other: No ascites or peritoneal nodules. Musculoskeletal: No acute or aggressive finding IMPRESSION: Findings of pancreas body adenocarcinoma as described above. Recommend  EUS/FNA. No definitive metastasis, but limited in the liver due to numerous sub-centimeter cysts/biliary hamartomas. Suggest liver staging with MRI. Electronically Signed   By: Monte Fantasia M.D.   On: 11/27/2015 15:09    Procedures   Medications Ordered in ED Medications  iopamidol (ISOVUE-300) 61 % injection (not administered)  gi cocktail (Maalox,Lidocaine,Donnatal) (30 mLs Oral Given 11/27/15 1400)  albuterol (PROVENTIL) (2.5 MG/3ML) 0.083% nebulizer solution 2.5 mg (2.5 mg Nebulization Given 11/27/15 1323)  iopamidol (ISOVUE-300) 61 % injection (30 mLs  Contrast Given 11/27/15 1339)  iopamidol (ISOVUE-300) 61 % injection 100 mL (100 mLs Intravenous Contrast Given 11/27/15 1440)     Initial Impression / Assessment and Plan / ED  Course  I have reviewed the triage vital signs and the nursing notes.  Pertinent labs & imaging results that were available during my care of the patient were reviewed by me and considered in my medical decision making (see chart for details).  Clinical Course   65 year old woman with history of pAF on Xarelto, previously had DCCV in June 2017, COPD, PUD, H Pylori, GERD, fibromyalgia, HTN presenting with acute epigastric abdominal pain. CMP with creatinine at baseline of around 1.1. WBC elevated to 24.5. She is afebrile here. Hgb normal. EKG with no acute ischemic changes but she does have sinus tachycardia with irregular rate, paroxysmal atrial fibrillation. Troponin negative. CT abdomen/pelvis with contrast with findings of pancreas body adenocarcinoma. No definitive metastasis but limited due to sub-centimeter cysts/biliary hamartomas.  Final Clinical Impressions(s) / ED Diagnoses   Final diagnoses:  Epigastric pain  Tachycardic with leukocytosis meeting SIRS criteria. Will obtain RUQ ultrasound to look for source of infection. Discussed with Dr. Laural Golden (GI). Recommends having her admitted given her tachycardia and leukocytosis. Could possibly be necrosis  within the tumor causing her white count. May consider transfer to [facility] which would expedite her receiving FNA/EUS.  Discussed with Dr. Lorin Mercy ([facility]) who has accepted the patient. Also discussed with Azucena Freed (PA for [facility]) who said to have them contacted when the patient gets to [facility].  New Prescriptions New Prescriptions   No medications on file     Milagros Loll, MD 11/27/15 [place], MD 11/27/15 [phone]

## 2015-11-27 NOTE — ED Triage Notes (Addendum)
Pt c/o intermittent mid abdominal pain that started last night about 2100. Pt reports she had a bleeding stomach ulcer back in July and she feels like this pain is coming from her ulcer. Pt denies n/v/d, denies black/tarry stool. Pt reports she ate eggs with pepper last night and she thinks the pepper caused her pain. Pt reports weakness, but also states that she always feel somewhat weak.

## 2015-11-27 NOTE — ED Notes (Signed)
Asssited patient with ambulation to restroom. Patient became short of breath. Patient back in stretcher. No complaint noted.

## 2015-11-27 NOTE — ED Provider Notes (Signed)
HPI Comments: Lindsay Salazar is a 65 y.o. female who presents to the Emergency Department complaining of intermittent, unchanged mid abdominal pain that began last night. She also has SOB that she states is slightly worse than normal. She denies nausea, vomiting, diarrhea, blood in stool, dysuria, hematuria, CP.   Physical Examination: CARDIO: irregular tachycardia ABDOMEN: periumbilical, epigastric, and RLQ tenderness. +voluntary guarding   Labs with leukocytosis.  No fever.  Will obtain CT imaging.   By signing my name below, I, Sonum Patel, attest that this documentation has been prepared under the direction and in the presence of Ezequiel Essex, MD. Electronically Signed: Sonum Patel, Education administrator. 11/27/15. 1:20 PM.   I personally performed the services described in this documentation, which was scribed in my presence. The recorded information has been reviewed and is accurate.      Ezequiel Essex, MD 11/27/15 [phone]

## 2015-11-27 NOTE — ED Notes (Signed)
Nurse will call back from [facility] for report.

## 2015-11-27 NOTE — Progress Notes (Signed)
ANTICOAGULATION CONSULT NOTE - Initial Consult  Pharmacy Consult:  Heparin Indication: atrial fibrillation  Allergies  Allergen Reactions  . Ibuprofen Nausea And Vomiting  . Penicillins Swelling    Has patient had a PCN reaction causing immediate rash, facial/tongue/throat swelling, SOB or lightheadedness with hypotension: Yes Has patient had a PCN reaction causing severe rash involving mucus membranes or skin necrosis: No Has patient had a PCN reaction that required hospitalization No Has patient had a PCN reaction occurring within the last 10 years: No If all of the above answers are "NO", then may proceed with Cephalosporin use.   . Tiotropium Bromide Monohydrate Itching  . Tramadol Nausea And Vomiting  . Vicodin [Hydrocodone-Acetaminophen] Nausea And Vomiting    Patient Measurements: Height: 5\' 4"  (162.6 cm) Weight: 131 lb (59.4 kg) IBW/kg (Calculated) : 54.7 Heparin Dosing Weight: 59 kg  Vital Signs: Temp: 97.8 F (36.6 C) (10/24 2058) Temp Source: Oral (10/24 [id number]) BP: 106/74 (10/24 [id number]) Pulse Rate: 78 (10/24 2058)  Labs:  Recent Labs  11/27/15 1154 11/27/15 1245  HGB 12.1  --   HCT 38.3  --   PLT 267  --   CREATININE 1.06*  --   TROPONINI  --  <0.03    Estimated Creatinine Clearance: 44.5 mL/min (by C-G formula based on SCr of 1.06 mg/dL (H)).   Medical History: Past Medical History:  Diagnosis Date  . Anginal pain ([place])    thought she was having   heartburn  . Arthritis    bursitis , fibromyalgia  . Asthma   . Atrial fibrillation ([place])   . Chronic right hip pain   . COPD (chronic obstructive pulmonary disease) ([place])    wears home o2 prn  . Fibromyalgia   . GERD (gastroesophageal reflux disease)    use to have it but not now  . Heart murmur    60-  41 years old  . Hypertension   . Pneumothorax       Assessment: 65 YOF with history of GERD and PUD presented with complaint of stomach discomfort.  CT shows pancreatic adenocarcinoma.   Pharmacy consulted to start IV heparin for history of Afib while Xarelto is on hold for possible surgery.  Patient last took Xarelto on 11/26/15 at 1700.  Baseline labs reviewed.  Will likely need to use aPTT to guide heparin dosing as Xarelto can falsely elevated heparin levels.   Goal of Therapy:  Heparin level 0.3-0.7 units/ml aPTT 66 - 102 seconds Monitor platelets by anticoagulation protocol: Yes    Plan:  - Heparin gtt at 850 units/hr, no bolus - Check 6 hr heparin level and aPTT - Daily heparin level, CBC, aPTT   Willies Laviolette D. Mina Marble, PharmD, BCPS Pager:  [phone] 11/27/2015, 10:34 PM

## 2015-11-27 NOTE — ED Notes (Signed)
Report given to [facility].

## 2015-11-27 NOTE — H&P (Signed)
History and Physical    Lindsay Salazar [id number] DOB: [birth year] DOA: 11/27/2015  PCP: [name], PA-C Consultants:  Branch - cardiology; Luna Glasgow - orthopedics Patient coming from: home - lives with boyfriend and his brother; [place]: [phone]  Chief Complaint: stomach burning  HPI: Lindsay Salazar is a 65 y.o. female with medical history significant of GERD, HTN, Fibromyalgia, COPD on intermittent home O2, and afib on anticoagulation presenting with burning epigastric pain.  She reports that she ate some eggs last night about 8:30PM and started with stomach burning about 9PM.  Thought it was her ulcer.  Was hospitalized in July for ulcer, diagnosed with PUD.  Has been feeling okay with last hospitalization until last night.  Mild dizziness intermittently.  Uncertain about weight loss - but if so it may be related to not having food in the house, "we're dirt poor."  +night sweats x 6 months.  No abdominal pain.  Able to tolerate PO, but coffee causes diarrhea.   ED Course: Per Dr. Randell Patient:  Epigastric pain  Tachycardic with leukocytosis meeting SIRS criteria. Will obtain RUQ ultrasound to look for source of infection. Discussed with Dr. Laural Golden (GI). Recommends having her admitted given her tachycardia and leukocytosis. Could possibly be necrosis within the tumor causing her white count. May consider transfer to [facility] which would expedite her receiving FNA/EUS.  Discussed with Dr. Lorin Mercy ([facility]) who has accepted the patient. Also discussed with Azucena Freed (PA for [facility]) who said to have them contacted when the patient gets to [facility].   Review of Systems: As per HPI; otherwise 10 point review of systems reviewed and negative.   Ambulatory Status:  ambualtes without assistance  Past Medical History:  Diagnosis Date  . Anginal pain ([place])    thought she was having   heartburn  . Arthritis    bursitis , fibromyalgia  . Asthma   . Atrial fibrillation ([place])   . Chronic  right hip pain   . COPD (chronic obstructive pulmonary disease) ([place])    wears home o2 prn  . Fibromyalgia   . GERD (gastroesophageal reflux disease)    use to have it but not now  . Heart murmur    45-  43 years old  . Hypertension   . Pneumothorax     Past Surgical History:  Procedure Laterality Date  . CARDIOVERSION N/A 07/18/2015   Procedure: CARDIOVERSION;  Surgeon: Josue Hector, MD;  Location: [facility] ENDO SUITE;  Service: Cardiovascular;  Laterality: N/A;  . ESOPHAGOGASTRODUODENOSCOPY N/A 08/09/2015   Procedure: ESOPHAGOGASTRODUODENOSCOPY (EGD);  Surgeon: Rogene Houston, MD;  Location: [facility] END[facility] SUITE;  Service: Endoscopy;  Laterality: N/A;  . NECK SURGERY    . TONSILLECTOMY    . TUBAL LIGATION      Social History   Social History  . Marital status: Divorced    Spouse name: N/A  . Number of children: N/A  . Years of education: N/A   Occupational History  . retired    Social History Main Topics  . Smoking status: Current Every Day Smoker    Packs/day: 0.50    Years: 46.00    Types: Cigarettes    Start date: 10/09/1969  . Smokeless tobacco: Never Used  . Alcohol use No     Comment: quit 30 years ago  . Drug use: No  . Sexual activity: Yes    Birth control/ protection: Surgical, Post-menopausal   Other Topics Concern  . Not on file  Social History Narrative  . No narrative on file    Allergies  Allergen Reactions  . Ibuprofen Nausea And Vomiting  . Penicillins Swelling    Has patient had a PCN reaction causing immediate rash, facial/tongue/throat swelling, SOB or lightheadedness with hypotension: Yes Has patient had a PCN reaction causing severe rash involving mucus membranes or skin necrosis: No Has patient had a PCN reaction that required hospitalization No Has patient had a PCN reaction occurring within the last 10 years: No If all of the above answers are "NO", then may proceed with Cephalosporin use.   . Tiotropium Bromide Monohydrate Itching  .  Tramadol Nausea And Vomiting  . Vicodin [Hydrocodone-Acetaminophen] Nausea And Vomiting    Family History  Problem Relation Age of Onset  . COPD Mother   . Diabetes Father   . Stroke Father     Prior to Admission medications   Medication Sig Start Date End Date Taking? Authorizing Provider  albuterol (PROVENTIL) (2.5 MG/3ML) 0.083% nebulizer solution Take 3 mLs (2.5 mg total) by nebulization every 6 (six) hours as needed for wheezing or shortness of breath. 07/19/15  Yes Reyne Dumas, MD  diltiazem (CARDIZEM CD) 120 MG 24 hr capsule Take 1 capsule (120 mg total) by mouth 2 (two) times daily. 07/06/15  Yes Arnoldo Lenis, MD  HYDROcodone-acetaminophen (NORCO/VICODIN) 5-325 MG tablet Take 1 tablet by mouth every 6 (six) hours as needed for moderate pain (Must last 30 days.Do not take and drive a car or use machinery.). 11/15/15  Yes Sanjuana Kava, MD  ipratropium-albuterol (DUONEB) 0.5-2.5 (3) MG/3ML SOLN Inhale 3 mLs into the lungs every 6 (six) hours as needed (for shortness of breath).  08/03/15  Yes Historical Provider, MD  metoprolol tartrate (LOPRESSOR) 25 MG tablet Take 0.5 tablets (12.5 mg total) by mouth 2 (two) times daily. 09/06/15 12/05/15 Yes Luke K Kilroy, PA-C  rivaroxaban (XARELTO) 20 MG TABS tablet Take 1 tablet (20 mg total) by mouth daily with supper. 07/19/15  Yes Reyne Dumas, MD  bismuth-metronidazole-tetracycline (PYLERA) [phone] MG capsule Take 3 capsules by mouth 4 (four) times daily -  before meals and at bedtime. Patient not taking: Reported on 11/27/2015 08/12/15   Samuella Cota, MD  COMBIVENT RESPIMAT 20-100 MCG/ACT AERS respimat Inhale 1 puff into the lungs 2 (two) times daily. 08/03/15   Historical Provider, MD  escitalopram (LEXAPRO) 10 MG tablet Take 10 mg by mouth every evening.     Historical Provider, MD  furosemide (LASIX) 20 MG tablet Take 1 tablet (20 mg total) by mouth daily as needed for edema. Patient taking differently: Take 20 mg by mouth daily as  needed for fluid or edema.  07/06/15   Arnoldo Lenis, MD  pantoprazole (PROTONIX) 40 MG tablet Take 1 tablet (40 mg total) by mouth daily. 08/12/15   Samuella Cota, MD  SYMBICORT 160-4.5 MCG/ACT inhaler Inhale 1 puff into the lungs 2 (two) times daily. 07/06/15   Historical Provider, MD    Physical Exam: Vitals:   11/27/15 1930 11/27/15 1943 11/27/15 2058 11/27/15 2159  BP: 126/61  106/74   Pulse: 118  78   Resp: (!) 29  18   Temp:  98 F (36.7 C) 97.8 F (36.6 C)   TempSrc:  Oral Oral   SpO2: 98%  99% 99%  Weight:      Height:         General: Appears older than stated age, anxious Eyes: PERRL, EOMI, normal lids, iris ENT:  grossly  normal hearing, lips & tongue, mmm Neck:  no LAD, masses or thyromegaly Cardiovascular:  RRR, no m/r/g. No LE edema.  Respiratory:  CTA bilaterally, no w/r/r. Normal respiratory effort. Abdomen:  soft, mild RUQ TTP, nd, NABS Skin:  no rash or induration seen on limited exam Musculoskeletal:  grossly normal tone BUE/BLE, good ROM, no bony abnormality Psychiatric:  grossly normal mood and affect, speech fluent and appropriate, AOx3 Neurologic:  CN 2-12 grossly intact, moves all extremities in coordinated fashion, sensation intact  Labs on Admission: I have personally reviewed following labs and imaging studies  CBC:  Recent Labs Lab 11/27/15 1154  WBC 24.5*  HGB 12.1  HCT 38.3  MCV 79.5  PLT 99991111   Basic Metabolic Panel:  Recent Labs Lab 11/27/15 1154  NA 138  K 3.3*  CL 104  CO2 26  GLUCOSE 128*  BUN 22*  CREATININE 1.06*  CALCIUM 8.7*   GFR: Estimated Creatinine Clearance: 44.5 mL/min (by C-G formula based on SCr of 1.06 mg/dL (H)). Liver Function Tests:  Recent Labs Lab 11/27/15 1154  AST 19  ALT 26  ALKPHOS 92  BILITOT 0.5  PROT 6.9  ALBUMIN 3.4*    Recent Labs Lab 11/27/15 1154  LIPASE 19   No results for input(s): AMMONIA in the last 168 hours. Coagulation Profile: No results for input(s): INR, PROTIME  in the last 168 hours. Cardiac Enzymes:  Recent Labs Lab 11/27/15 1245  TROPONINI <0.03   BNP (last 3 results) No results for input(s): PROBNP in the last 8760 hours. HbA1C: No results for input(s): HGBA1C in the last 72 hours. CBG: No results for input(s): GLUCAP in the last 168 hours. Lipid Profile: No results for input(s): CHOL, HDL, LDLCALC, TRIG, CHOLHDL, LDLDIRECT in the last 72 hours. Thyroid Function Tests: No results for input(s): TSH, T4TOTAL, FREET4, T3FREE, THYROIDAB in the last 72 hours. Anemia Panel: No results for input(s): VITAMINB12, FOLATE, FERRITIN, TIBC, IRON, RETICCTPCT in the last 72 hours. Urine analysis:    Component Value Date/Time   COLORURINE YELLOW 11/27/2015 [place] 11/27/2015 1513   LABSPEC <1.005 (L) 11/27/2015 1513   PHURINE 6.0 11/27/2015 1513   GLUCOSEU NEGATIVE 11/27/2015 1513   HGBUR MODERATE (A) 11/27/2015 1513   BILIRUBINUR NEGATIVE 11/27/2015 1513   KETONESUR NEGATIVE 11/27/2015 1513   PROTEINUR TRACE (A) 11/27/2015 1513   NITRITE NEGATIVE 11/27/2015 1513   LEUKOCYTESUR NEGATIVE 11/27/2015 1513    Creatinine Clearance: Estimated Creatinine Clearance: 44.5 mL/min (by C-G formula based on SCr of 1.06 mg/dL (H)).  Sepsis Labs: @LABRCNTIP (procalcitonin:4,lacticidven:4) )No results found for this or any previous visit (from the past 240 hour(s)).   Radiological Exams on Admission: Ct Abdomen Pelvis W Contrast  Result Date: 11/27/2015 CLINICAL DATA:  Abdominal pain.  History of GI bleeding. EXAM: CT ABDOMEN AND PELVIS WITH CONTRAST TECHNIQUE: Multidetector CT imaging of the abdomen and pelvis was performed using the standard protocol following bolus administration of intravenous contrast. CONTRAST:  100 cc Isovue-300 intravenous COMPARISON:  None. FINDINGS: Lower chest:  No nodules. Hepatobiliary: Simple appearing cysts in the inferior left liver measuring nearly 7 cm. Numerous other low-density areas are well-defined  and likely cysts or hamartomas, but limited by small size. No definitive liver metastasis. Pancreas: 32 mm long, 25 mm thick hypoenhancing mass in the pancreatic body with ductal obstruction and parenchymal atrophy seen at the level of the tail. On sagittal reformats the mass encases the mid splenic artery and splenic vein with mild splenic vein narrowing  1-2 cm beyond the portal vein confluence. No infiltration seen around the celiac axis or SMA. No peripancreatic lymph nodes. Spleen: Unremarkable. Adrenals/Urinary Tract: Negative adrenals. No hydronephrosis or stone. Unremarkable bladder. Stomach/Bowel:  No obstruction. No appendicitis. Vascular/Lymphatic: No acute vascular abnormality. Splenic vascular findings described on pancreas section. Atherosclerosis. No mass or adenopathy. Reproductive:No pathologic findings. Other: No ascites or peritoneal nodules. Musculoskeletal: No acute or aggressive finding IMPRESSION: Findings of pancreas body adenocarcinoma as described above. Recommend EUS/FNA. No definitive metastasis, but limited in the liver due to numerous sub-centimeter cysts/biliary hamartomas. Suggest liver staging with MRI. Electronically Signed   By: Monte Fantasia M.D.   On: 11/27/2015 15:09   US Abdomen Limited Ruq  Result Date: 11/27/2015 CLINICAL DATA:  One day of abdominal pain. EXAM: US ABDOMEN LIMITED - RIGHT UPPER QUADRANT COMPARISON:  CT of earlier today. FINDINGS: Gallbladder: No gallstones or wall thickening visualized. No sonographic Murphy sign noted by sonographer. Common bile duct: Diameter: Normal, 6 mm. Liver: 8.0 cm left hepatic lobe cyst, as detailed on CT. This has minimal complexity within. IMPRESSION: Normal gallbladder and biliary tree. Left hepatic lobe complex cyst. Electronically Signed   By: Abigail Miyamoto M.D.   On: 11/27/2015 16:11    EKG: Independently reviewed.  Afib with rate 113; nonspecific ST changes with no evidence of acute  ischemia  Assessment/Plan Principal Problem:   Pancreatic mass Active Problems:   Tobacco abuse   Atrial fibrillation (HCC)   Chronic obstructive pulmonary disease (COPD) (HCC)   Chronic anticoagulation-Xarelto   Hypokalemia   Leukocytosis   Pancreatic mass -Patient presenting with epigastric burning, found to have pancreatic adenocarcinoma by CT -Uncertain about mets, will obtain MRI as recommended by radiology -Per GI, likely to need ERCP/FNA for tissue diagnosis and so will transfer to [facility] for further evaluation (GI will be called upon arrival) -NPO after midnight in anticipation of procedure -Pain control with morphine  Afib on Xarelto -Will continue home rate controlling medications for now (Cardizem, Lopressor) -If ongoing RVR (borderline while at [facility], but patient also very anxious following likely cancer diagnosis), may require IV Cardizem and/or drip -CHA2DS2-VASc score 4, needs ongoing anticoagulation -Since she may need an intervention/procedure at [facility], will hold Xarelto and start Heparin drip  COPD/tobacco abuse -Uses intermittent home O2; may benefit from education about the value of nighttime O2 -Continue nebs - Duonebs q6h, Albuterol q2h prn -Continue Symbicort (Dulera substituted as per pharmacy) -Tobacco Dependence: encourage cessation.  This was discussed with the patient and should be reviewed on an ongoing basis.   -Patch ordered at patient request.  Hypokalemia -Replete and follow  Leukocytosis -Technically does have SIRS criteria with leukocytosis, tachycardia (afib with RVR), and intermittent tachypnea (likely COPD + anxiety) -No other indication of infection at this time -WBC count thought to be acute phase reaction and so antibiotics are held at this time   DVT prophylaxis: Xarelto transitioning to Heparin drip Code Status:  Full - confirmed with patient Family Communication: None present Disposition Plan:  Home once clinically improved Consults  called: GI Admission status: Admit - It is my clinical opinion that admission to [place] is reasonable and necessary because this patient will require at least 2 midnights in the hospital to treat this condition based on the medical complexity of the problems presented.  Given the aforementioned information, the predictability of an adverse outcome is felt to be significant.   *Note - patient reports being "dirt poor", cannot afford food, does not have running water at  her home.  Her boyfriend cannot afford the gas to drive to [facility] to see her.  She is likely to need SW/CM resources at the time of discharge.   Karmen Bongo MD [facility]  If 7PM-7AM, please contact night-coverage [email or web address] Password [email or web address]  11/27/2015, 10:11 PM

## 2015-11-27 NOTE — ED Notes (Signed)
PAtient stating she needs a breathing treatment. Respiratory called.

## 2015-11-28 ENCOUNTER — Inpatient Hospital Stay (HOSPITAL_COMMUNITY): Payer: Medicare HMO

## 2015-11-28 DIAGNOSIS — Z7901 Long term (current) use of anticoagulants: Secondary | ICD-10-CM

## 2015-11-28 DIAGNOSIS — G8929 Other chronic pain: Secondary | ICD-10-CM

## 2015-11-28 DIAGNOSIS — R1013 Epigastric pain: Secondary | ICD-10-CM

## 2015-11-28 DIAGNOSIS — I4891 Unspecified atrial fibrillation: Secondary | ICD-10-CM

## 2015-11-28 DIAGNOSIS — I481 Persistent atrial fibrillation: Secondary | ICD-10-CM

## 2015-11-28 DIAGNOSIS — R634 Abnormal weight loss: Secondary | ICD-10-CM

## 2015-11-28 LAB — CBC
HEMATOCRIT: 36.3 % (ref 36.0–46.0)
Hemoglobin: 11.3 g/dL — ABNORMAL LOW (ref 12.0–15.0)
MCH: 24.1 pg — ABNORMAL LOW (ref 26.0–34.0)
MCHC: 31.1 g/dL (ref 30.0–36.0)
MCV: 77.6 fL — AB (ref 78.0–100.0)
Platelets: 271 10*3/uL (ref 150–400)
RBC: 4.68 MIL/uL (ref 3.87–5.11)
RDW: 18.4 % — AB (ref 11.5–15.5)
WBC: 17.8 10*3/uL — AB (ref 4.0–10.5)

## 2015-11-28 LAB — BASIC METABOLIC PANEL
Anion gap: 9 (ref 5–15)
BUN: 13 mg/dL (ref 6–20)
CHLORIDE: 103 mmol/L (ref 101–111)
CO2: 23 mmol/L (ref 22–32)
Calcium: 8 mg/dL — ABNORMAL LOW (ref 8.9–10.3)
Creatinine, Ser: 0.9 mg/dL (ref 0.44–1.00)
GFR calc Af Amer: >60 mL/min (ref >60)
GFR calc non Af Amer: >60 mL/min (ref >60)
Glucose, Bld: 110 mg/dL — ABNORMAL HIGH (ref 65–99)
POTASSIUM: 3.7 mmol/L (ref 3.5–5.1)
SODIUM: 135 mmol/L (ref 135–145)

## 2015-11-28 LAB — APTT
APTT: 67 s — AB (ref 24–36)
aPTT: 52 seconds — ABNORMAL HIGH (ref 24–36)

## 2015-11-28 LAB — HEPARIN LEVEL (UNFRACTIONATED): Heparin Unfractionated: 1.22 IU/mL — ABNORMAL HIGH (ref 0.30–0.70)

## 2015-11-28 MED ORDER — METOPROLOL TARTRATE 25 MG PO TABS
25.0000 mg | ORAL_TABLET | Freq: Two times a day (BID) | ORAL | Status: DC
  Administered 2015-11-28 – 2015-11-29 (×2): 25 mg via ORAL
  Filled 2015-11-28 (×2): qty 1

## 2015-11-28 MED ORDER — ALBUTEROL SULFATE (2.5 MG/3ML) 0.083% IN NEBU
2.5000 mg | INHALATION_SOLUTION | RESPIRATORY_TRACT | Status: DC | PRN
  Administered 2015-12-02: 2.5 mg via RESPIRATORY_TRACT
  Filled 2015-11-28: qty 3

## 2015-11-28 MED ORDER — GADOBENATE DIMEGLUMINE 529 MG/ML IV SOLN
15.0000 mL | Freq: Once | INTRAVENOUS | Status: AC
  Administered 2015-11-28: 13 mL via INTRAVENOUS

## 2015-11-28 MED ORDER — DILTIAZEM HCL 60 MG PO TABS
60.0000 mg | ORAL_TABLET | Freq: Four times a day (QID) | ORAL | Status: DC
  Administered 2015-11-28 – 2015-12-02 (×15): 60 mg via ORAL
  Filled 2015-11-28 (×15): qty 1

## 2015-11-28 NOTE — Progress Notes (Signed)
Heparin stopped for MRI.

## 2015-11-28 NOTE — Progress Notes (Signed)
ANTICOAGULATION CONSULT NOTE - Follow Up Consult  Pharmacy Consult for heparin Indication: atrial fibrillation  Labs:  Recent Labs  11/27/15 1154 11/27/15 1245 11/28/15 0505  HGB 12.1  --  11.3*  HCT 38.3  --  36.3  PLT 267  --  271  APTT  --   --  52*  CREATININE 1.06*  --  0.90  TROPONINI  --  <0.03  --     Assessment: 65yo female subtherapeutic on heparin with initial dosing while Xarelto on hold.  Goal of Therapy:  aPTT 66-102 seconds   Plan:  Will increase heparin gtt by 2-3 units/kg/hr to 1000 units/hr and check PTT in 6hr.  Wynona Neat, PharmD, BCPS  11/28/2015,7:04 AM

## 2015-11-28 NOTE — Progress Notes (Addendum)
PROGRESS NOTE                                                                                                                                                                                                             Patient Demographics:    Lindsay Salazar, is a 65 y.o. female, DOB - [birth year], [id number]  Admit date - 11/27/2015   Admitting Physician Karmen Bongo, MD  Outpatient Primary MD for the patient is [name], PA-C  LOS - 1  Outpatient Specialists:None  Chief Complaint  Patient presents with  . Abdominal Pain       Brief Narrative   65 year old female with history of GERD, hypertension, fibromyalgia, COPD on intermittent home O2, active tobacco use and A. fib on anticoagulation presented to [facility] ED with burning epigastric pain. She was hospitalized in July this year for peptic ulcer disease. Patient reports feeling dizzy intermittently. She is unsure about weight loss and reports that she has not been eating well due to financial crisis and not having enough food at home.  In the ED she was in A. fib with occasional RVR. She had leukocytosis and mildly tachypneic meeting SIRS criteria. Blood work showed normal hemoglobin, begin of 22 and creatinine of 1. CT of the abdomen and pelvis showed a hypoechogenic mass in the pancreatic body with Dr. obstruction suggestive of getting adenocarcinoma. Transferred to [facility] for EUS with FNA.    Subjective:   Patient still has some burning epigastric pain. Denies nausea or vomiting.   Assessment  & Plan :    Principal Problem:   Pancreatic mass/Adenocarcinoma MRI abdomen for further evaluation. Check CA 19-9. [facility] following. Coordinating with Dr. Ardis Hughs for EUS at [facility] long tomorrow. Nothing by mouth after midnight.  Active Problems: SIRS Possibly due to primary issue. No signs of infection. Continue hydration.     Tobacco  abuse Counseled on cessation.    Atrial fibrillation (HCC) Rate occasionally elevated to 120s. I have changed her Cardizem to 60 mg every 6 hours. Also increase metoprolol to 25 twice a day. Switch to long-acting Cardizem once heart rate better controlled.  Hold Xarelto and continue on heparin drip.    Chronic obstructive pulmonary disease (COPD) ([place]) Continue home inhalers. Counseled on smoking cessation   Hypokalemia Replenished  Poor home living situation. Patient reported  that she is very poor and unable to afford food and neither has running water at home. Social work consulted to provide necessary support.   Code Status : Full code  Family Communication  : None at bedside  Disposition Plan  : Home once workup completed.  Barriers For Discharge : Active symptoms, needs EUS.  Consults  :  [facility]  Procedures  :  CT abdomen pelvis Ultrasound abdomen MRI abdomen EUS  DVT Prophylaxis  :  IV heparin  Lab Results  Component Value Date   PLT 271 11/28/2015    Antibiotics  :    Anti-infectives    None        Objective:   Vitals:   11/27/15 2159 11/28/15 0604 11/28/15 0739 11/28/15 0802  BP:  (!) 138/57  135/60  Pulse:  85    Resp:  18    Temp:  99 F (37.2 C)    TempSrc:  Oral    SpO2: 99% 95% 98%   Weight:      Height:        Wt Readings from Last 3 Encounters:  11/27/15 59.4 kg (131 lb)  10/24/15 58.5 kg (129 lb)  10/10/15 59.8 kg (131 lb 12.8 oz)     Intake/Output Summary (Last 24 hours) at 11/28/15 1320 Last data filed at 11/28/15 0959  Gross per 24 hour  Intake            52.84 ml  Output              200 ml  Net          -147.16 ml     Physical Exam  Gen: not in distress HEENT: Moist mucosa, supple neck, no cervical lymphadenopathy Chest: clear b/l, no added sounds CVS: S1 and S2 irregular, no murmurs GI: soft, nondistended, bowel sounds present, mild epigastric tenderness Musculoskeletal: warm, no edema     Data Review:     CBC  Recent Labs Lab 11/27/15 1154 11/28/15 0505  WBC 24.5* 17.8*  HGB 12.1 11.3*  HCT 38.3 36.3  PLT 267 271  MCV 79.5 77.6*  MCH 25.1* 24.1*  MCHC 31.6 31.1  RDW 18.2* 18.4*    Chemistries   Recent Labs Lab 11/27/15 1154 11/28/15 0505  NA 138 135  K 3.3* 3.7  CL 104 103  CO2 26 23  GLUCOSE 128* 110*  BUN 22* 13  CREATININE 1.06* 0.90  CALCIUM 8.7* 8.0*  AST 19  --   ALT 26  --   ALKPHOS 92  --   BILITOT 0.5  --    ------------------------------------------------------------------------------------------------------------------ No results for input(s): CHOL, HDL, LDLCALC, TRIG, CHOLHDL, LDLDIRECT in the last 72 hours.  Lab Results  Component Value Date   HGBA1C 5.8 (H) 07/17/2015   ------------------------------------------------------------------------------------------------------------------ No results for input(s): TSH, T4TOTAL, T3FREE, THYROIDAB in the last 72 hours.  Invalid input(s): FREET3 ------------------------------------------------------------------------------------------------------------------ No results for input(s): VITAMINB12, FOLATE, FERRITIN, TIBC, IRON, RETICCTPCT in the last 72 hours.  Coagulation profile No results for input(s): INR, PROTIME in the last 168 hours.  No results for input(s): DDIMER in the last 72 hours.  Cardiac Enzymes  Recent Labs Lab 11/27/15 1245  TROPONINI <0.03   ------------------------------------------------------------------------------------------------------------------    Component Value Date/Time   BNP 415.0 (H) 07/17/2015 0358    Inpatient Medications  Scheduled Meds: . diltiazem  60 mg Oral Q6H  . escitalopram  10 mg Oral QPM  . ipratropium-albuterol  3 mL Inhalation TID  . metoprolol  tartrate  12.5 mg Oral BID  . mometasone-formoterol  2 puff Inhalation BID  . nicotine  14 mg Transdermal Daily   Continuous Infusions: . heparin Stopped (11/28/15 1134)   PRN  Meds:.acetaminophen **OR** acetaminophen, albuterol, HYDROcodone-acetaminophen, morphine injection, ondansetron **OR** ondansetron (ZOFRAN) IV  Micro Results No results found for this or any previous visit (from the past 240 hour(s)).  Radiology Reports Ct Abdomen Pelvis W Contrast  Result Date: 11/27/2015 CLINICAL DATA:  Abdominal pain.  History of GI bleeding. EXAM: CT ABDOMEN AND PELVIS WITH CONTRAST TECHNIQUE: Multidetector CT imaging of the abdomen and pelvis was performed using the standard protocol following bolus administration of intravenous contrast. CONTRAST:  100 cc Isovue-300 intravenous COMPARISON:  None. FINDINGS: Lower chest:  No nodules. Hepatobiliary: Simple appearing cysts in the inferior left liver measuring nearly 7 cm. Numerous other low-density areas are well-defined and likely cysts or hamartomas, but limited by small size. No definitive liver metastasis. Pancreas: 32 mm long, 25 mm thick hypoenhancing mass in the pancreatic body with ductal obstruction and parenchymal atrophy seen at the level of the tail. On sagittal reformats the mass encases the mid splenic artery and splenic vein with mild splenic vein narrowing 1-2 cm beyond the portal vein confluence. No infiltration seen around the celiac axis or SMA. No peripancreatic lymph nodes. Spleen: Unremarkable. Adrenals/Urinary Tract: Negative adrenals. No hydronephrosis or stone. Unremarkable bladder. Stomach/Bowel:  No obstruction. No appendicitis. Vascular/Lymphatic: No acute vascular abnormality. Splenic vascular findings described on pancreas section. Atherosclerosis. No mass or adenopathy. Reproductive:No pathologic findings. Other: No ascites or peritoneal nodules. Musculoskeletal: No acute or aggressive finding IMPRESSION: Findings of pancreas body adenocarcinoma as described above. Recommend EUS/FNA. No definitive metastasis, but limited in the liver due to numerous sub-centimeter cysts/biliary hamartomas. Suggest liver  staging with MRI. Electronically Signed   By: Monte Fantasia M.D.   On: 11/27/2015 15:09   US Abdomen Limited Ruq  Result Date: 11/27/2015 CLINICAL DATA:  One day of abdominal pain. EXAM: US ABDOMEN LIMITED - RIGHT UPPER QUADRANT COMPARISON:  CT of earlier today. FINDINGS: Gallbladder: No gallstones or wall thickening visualized. No sonographic Murphy sign noted by sonographer. Common bile duct: Diameter: Normal, 6 mm. Liver: 8.0 cm left hepatic lobe cyst, as detailed on CT. This has minimal complexity within. IMPRESSION: Normal gallbladder and biliary tree. Left hepatic lobe complex cyst. Electronically Signed   By: Abigail Miyamoto M.D.   On: 11/27/2015 16:11    Time Spent in minutes  35   Travanti Mcmanus M.D on 11/28/2015 at 1:20 PM  Between 7am to 7pm - Pager - [phone]  After 7pm go to [email or web address] - password [facility]  [facility] -  Office  [phone]

## 2015-11-28 NOTE — Progress Notes (Signed)
ANTICOAGULATION CONSULT NOTE - Follow Up Consult  Pharmacy Consult for heparin Indication: atrial fibrillation  Allergies  Allergen Reactions  . Ibuprofen Nausea And Vomiting  . Penicillins Swelling    Has patient had a PCN reaction causing immediate rash, facial/tongue/throat swelling, SOB or lightheadedness with hypotension: Yes Has patient had a PCN reaction causing severe rash involving mucus membranes or skin necrosis: No Has patient had a PCN reaction that required hospitalization No Has patient had a PCN reaction occurring within the last 10 years: No If all of the above answers are "NO", then may proceed with Cephalosporin use.   . Tiotropium Bromide Monohydrate Itching  . Tramadol Nausea And Vomiting  . Vicodin [Hydrocodone-Acetaminophen] Nausea And Vomiting    Patient Measurements: Height: 5\' 4"  (162.6 cm) Weight: 131 lb (59.4 kg) IBW/kg (Calculated) : 54.7 Heparin Dosing Weight: 59.4 kg  Vital Signs: Temp: 98.2 F (36.8 C) (10/25 2119) Temp Source: Oral (10/25 2119) BP: 130/60 (10/25 2119) Pulse Rate: 77 (10/25 2119)  Labs:  Recent Labs  11/27/15 1154 11/27/15 1245 11/28/15 0505  HGB 12.1  --  11.3*  HCT 38.3  --  36.3  PLT 267  --  271  APTT  --   --  52*  HEPARINUNFRC  --   --  1.22*  CREATININE 1.06*  --  0.90  TROPONINI  --  <0.03  --     Estimated Creatinine Clearance: 52.4 mL/min (by C-G formula based on SCr of 0.9 mg/dL).   Medications:  Scheduled:  . diltiazem  60 mg Oral Q6H  . escitalopram  10 mg Oral QPM  . ipratropium-albuterol  3 mL Inhalation TID  . metoprolol tartrate  25 mg Oral BID  . mometasone-formoterol  2 puff Inhalation BID  . nicotine  14 mg Transdermal Daily   Infusions:  . heparin 1,000 Units/hr (11/28/15 1300)    Assessment: 65 yo female with afib is currently on therapeutic heparin. Her aPTT level is 67.  Goal of Therapy:  APTT level 66-102 secs Monitor platelets by anticoagulation protocol: Yes   Plan:  -  continue heparin at 1000 units/hr - confirm aPTT in the morning  Crystalee Ventress, Tsz-Yin 11/28/2015,9:48 PM

## 2015-11-28 NOTE — Progress Notes (Signed)
   11/28/15 0920  Clinical Encounter Type  Visited With Patient  Visit Type Other (Comment) (Consult)  Referral From Nurse  Consult/Referral To Chaplain  Spiritual Encounters  Spiritual Needs Prayer;Emotional  Stress Factors  Patient Stress Factors Health changes  Offered prayer for recovery of Pt and the medical staff providing care.

## 2015-11-28 NOTE — Consult Note (Signed)
[facility] Consult Note   History Lindsay Salazar MRN # [id number]  Date of Admission: 11/27/2015 Date of Consultation: 11/28/2015 Referring physician: Dr. Louellen Molder, MD  Reason for Consultation/Chief Complaint: Pancreatic mass  Subjective  HPI:  This is a 65 year old woman transferred overnight from [facility], having come in earlier that day complaining of epigastric burning. She had been diagnosed with a pyloric ulcer in July of this year, felt likely related to aspirin and NSAID use. She thought she was having a flare of the ulcer, but a CT scan yesterday revealed a mass in the pancreatic body. It appears to be locally advanced, encasing regional blood vessels. She was also tachycardic and had a leukocytosis of 24.5 thousand. There was concern for SIRS, and apparently the GI physician at [facility] was concerned there may be some necrotic process related to the mass causing his clinical picture. She was therefore transferred to [facility] "to expedite scheduling of an EUS/FNA". The patient also appears to have very few resources, with difficulty obtaining medicines food and transportation. She says that she has minimal epigastric burning pain today. She is very anxious and concerned since the diagnosis of a mass yesterday.  She has history of atrial fibrillation requiring cardioversion earlier this year. She is maintained on chronic C, the last dose was taken about 36 hours ago, Monday evening. The medicine is been held and she is on IV heparin. She was found to be in A. fib with RVR, and so far only on oral meds for rate control. She only just arrived on the medical floor in the last 90 minutes, she has been kept nothing by mouth and is wondering when her procedure will be done.  ROS:  Constitutional:  She probably has lost some weight but is uncertain and is a very poor historian. She reports that she often does not have enough money for food. However,  she continues to smoke cigarettes. Cardiovascular:   She was having some palpitations yesterday Respiratory:   Chronic dyspnea with exertion, uses oxygen "from time to time" She has chronic musculoskeletal pain from fibromyalgia  All other systems are negative except as noted above in the HPI  Past Medical History Past Medical History:  Diagnosis Date  . Anginal pain ([place])    thought she was having   heartburn  . Arthritis    bursitis , fibromyalgia  . Arthritis    "right hip" (11/27/2015)  . Asthma   . Atrial fibrillation ([place])   . Chronic bronchitis ([place])   . Chronic right hip pain   . COPD (chronic obstructive pulmonary disease) ([place])    wears home o2 prn  . Daily headache    "last 2-3 months" (11/27/2015)  . Exposure to TB    "mom had it when she was pregnant with me"  . Fibromyalgia   . GERD (gastroesophageal reflux disease)    use to have it but not now  . Heart murmur    [age] years old  . Hypertension   . On home oxygen therapy    11/27/2015 "whenever I need it; don't know how many liters"  . Pneumonia    "several times" (11/27/2015)  . Pneumothorax     Past Surgical History Past Surgical History:  Procedure Laterality Date  . ANTERIOR CERVICAL DECOMP/DISCECTOMY FUSION     "put 4 screws in"  . BACK SURGERY    . CARDIAC CATHETERIZATION  08/2004   Archie Endo 06/18/2010  . CARDIOVERSION  N/A 07/18/2015   Procedure: CARDIOVERSION;  Surgeon: Josue Hector, MD;  Location: [facility]ITE;  Service: Cardiovascular;  Laterality: N/A;  . ESOPHAGOGASTRODUODENOSCOPY N/A 08/09/2015   Procedure: ESOPHAGOGASTRODUODENOSCOPY (EGD);  Surgeon: Rogene Houston, MD;  Location: [facility] END[facility];  Service: Endoscopy;  Laterality: N/A;  . TONSILLECTOMY    . TUBAL LIGATION      Family History Family History  Problem Relation Age of Onset  . COPD Mother   . Diabetes Father   . Stroke Father     Social History Social History   Social History  . Marital status: Divorced    Spouse  name: N/A  . Number of children: N/A  . Years of education: N/A   Occupational History  . retired    Social History Main Topics  . Smoking status: Current Every Day Smoker    Packs/day: 0.50    Years: 46.00    Types: Cigarettes    Start date: 10/09/1969  . Smokeless tobacco: Never Used  . Alcohol use No     Comment: quit 30 years ago  . Drug use: No  . Sexual activity: Not Currently    Birth control/ protection: Surgical, Post-menopausal   Other Topics Concern  . None   Social History Narrative  . None    Allergies Allergies  Allergen Reactions  . Ibuprofen Nausea And Vomiting  . Penicillins Swelling    Has patient had a PCN reaction causing immediate rash, facial/tongue/throat swelling, SOB or lightheadedness with hypotension: Yes Has patient had a PCN reaction causing severe rash involving mucus membranes or skin necrosis: No Has patient had a PCN reaction that required hospitalization No Has patient had a PCN reaction occurring within the last 10 years: No If all of the above answers are "NO", then may proceed with Cephalosporin use.   . Tiotropium Bromide Monohydrate Itching  . Tramadol Nausea And Vomiting  . Vicodin [Hydrocodone-Acetaminophen] Nausea And Vomiting    Outpatient Meds Home medications from the H+P and/or nursing med reconciliation reviewed.  Inpatient med list reviewed  _____________________________________________________________________ Objective   Exam:  Current vital signs  Patient Vitals for the past 8 hrs:  BP Temp Temp src Pulse Resp SpO2  11/28/15 0802 135/60 - - - - -  11/28/15 0739 - - - - - 98 %  11/28/15 0604 (!) 138/57 99 F (37.2 C) Oral 85 18 95 %    Intake/Output Summary (Last 24 hours) at 11/28/15 0913 Last data filed at 11/28/15 0600  Gross per 24 hour  Intake            52.84 ml  Output              200 ml  Net          -147.16 ml    Physical Exam:  Her pulse is approximately 110 and irregular. She has not  yet been placed on the central telemetry monitor.  General: this is a Chronically ill-appearing, unkempt female patient in no acute distress, she looks older than stated age.  Eyes: sclera anicteric, no redness  ENT: oral mucosa moist without lesions, no cervical or supraclavicular lymphadenopathy, poor dentition  CV: RRR without murmur, S1/S2, no JVD,, no peripheral edema  Resp: Diffusely decreased breath sounds to auscultation bilaterally, normal RR and effort noted, no wheezing  GI: soft, no tenderness, with active bowel sounds. No guarding or palpable organomegaly noted  Skin; warm and dry, no rash or jaundice noted  Neuro: awake, alert  and oriented x 3. Normal gross motor function and fluent speech.  Labs:   Recent Labs Lab 11/27/15 1154 11/28/15 0505  WBC 24.5* 17.8*  HGB 12.1 11.3*  HCT 38.3 36.3  PLT 267 271    Recent Labs Lab 11/27/15 1154 11/28/15 0505  NA 138 135  K 3.3* 3.7  CL 104 103  CO2 26 23  BUN 22* 13  ALBUMIN 3.4*  --   ALKPHOS 92  --   ALT 26  --   AST 19  --   GLUCOSE 128* 110*   No results for input(s): INR in the last 168 hours.  Radiologic studies: I personally reviewed the CT scan images, revealing a mass in the body of the pancreas apparent involvement of regional blood vessels. There are numerous small liver lesions that are not felt likely to be metastasis. See full report for details.  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 fib with RVR Unspecified weight loss   Plan:  The patient needs an EUS/FNA, but I am not certain she will yet be ready to have this done tomorrow. It would be done by our partner Dr. Ardis Hughs, and they are only done@[facility] and usually on the available Thursday block time. It would require ambulance transfer to [facility] for the procedure. I do not yet know if she would stay there afterwards or  be transferred back to [facility]. I am not yet certain she will be medically ready to have this done tomorrow since she is still in rapid A. Fib.  I have ordered a CA 19-9 She can eat today as she is certainly not having an endoscopic procedure today: Diet orders were written.  We will communicate with the hospitalist and suggest that perhaps a Cardizem drip may be helpful to get her A. fib under control in an effort to get her ready for an endoscopic procedure as soon as possible. We certainly don't want her to be in the hospital any longer than necessary, and yet I understand there are socioeconomic obstacles to her having this done as an outpatient.  Remain on IV heparin for now, it can always be stopped several hours prior to the procedure. Do not resume [place] yet   Thank you for the courtesy of this consult.  Please contact me with any questions or concerns.  Nelida Meuse III Pager: [phone] Mon-Fri 8a-5p [phone] after 5p, weekends, holidays  CC: Dr.Rehman, [facility]

## 2015-11-29 ENCOUNTER — Inpatient Hospital Stay (HOSPITAL_COMMUNITY): Payer: Medicare HMO

## 2015-11-29 ENCOUNTER — Encounter (HOSPITAL_COMMUNITY): Payer: Self-pay

## 2015-11-29 ENCOUNTER — Inpatient Hospital Stay (HOSPITAL_COMMUNITY): Payer: Medicare HMO | Admitting: Anesthesiology

## 2015-11-29 ENCOUNTER — Encounter (HOSPITAL_COMMUNITY)
Admission: EM | Disposition: A | Discharge status: Home / Self Care | Payer: Self-pay | Source: Home / Self Care | Attending: Internal Medicine

## 2015-11-29 DIAGNOSIS — R0789 Other chest pain: Secondary | ICD-10-CM

## 2015-11-29 DIAGNOSIS — E876 Hypokalemia: Secondary | ICD-10-CM

## 2015-11-29 DIAGNOSIS — I4892 Unspecified atrial flutter: Secondary | ICD-10-CM

## 2015-11-29 HISTORY — PX: EUS: SHX5427

## 2015-11-29 LAB — CBC
HEMATOCRIT: 35.8 % — AB (ref 36.0–46.0)
Hemoglobin: 11.1 g/dL — ABNORMAL LOW (ref 12.0–15.0)
MCH: 24.3 pg — AB (ref 26.0–34.0)
MCHC: 31 g/dL (ref 30.0–36.0)
MCV: 78.5 fL (ref 78.0–100.0)
PLATELETS: 287 10*3/uL (ref 150–400)
RBC: 4.56 MIL/uL (ref 3.87–5.11)
RDW: 18.3 % — AB (ref 11.5–15.5)
WBC: 15.8 10*3/uL — AB (ref 4.0–10.5)

## 2015-11-29 LAB — TROPONIN I

## 2015-11-29 LAB — HEPARIN LEVEL (UNFRACTIONATED): Heparin Unfractionated: 0.31 IU/mL (ref 0.30–0.70)

## 2015-11-29 LAB — D-DIMER, QUANTITATIVE (NOT AT ARMC): D DIMER QUANT: 0.32 ug{FEU}/mL (ref 0.00–0.50)

## 2015-11-29 LAB — APTT: aPTT: 55 seconds — ABNORMAL HIGH (ref 24–36)

## 2015-11-29 SURGERY — UPPER ENDOSCOPIC ULTRASOUND (EUS) LINEAR
Anesthesia: Monitor Anesthesia Care

## 2015-11-29 SURGERY — UPPER ENDOSCOPIC ULTRASOUND (EUS) RADIAL
Anesthesia: Monitor Anesthesia Care

## 2015-11-29 MED ORDER — PHENYLEPHRINE HCL 10 MG/ML IJ SOLN
INTRAMUSCULAR | Status: DC | PRN
  Administered 2015-11-29 (×7): 40 ug via INTRAVENOUS

## 2015-11-29 MED ORDER — PROPOFOL 10 MG/ML IV BOLUS
INTRAVENOUS | Status: AC
  Filled 2015-11-29: qty 20

## 2015-11-29 MED ORDER — METOPROLOL TARTRATE 5 MG/5ML IV SOLN
2.5000 mg | INTRAVENOUS | Status: DC | PRN
  Administered 2015-11-29 (×2): 2.5 mg via INTRAVENOUS

## 2015-11-29 MED ORDER — SODIUM CHLORIDE 0.9 % IV SOLN
INTRAVENOUS | Status: DC
  Administered 2015-11-29: 08:00:00 via INTRAVENOUS

## 2015-11-29 MED ORDER — METOPROLOL TARTRATE 5 MG/5ML IV SOLN
5.0000 mg | INTRAVENOUS | Status: DC | PRN
  Filled 2015-11-29 (×3): qty 5

## 2015-11-29 MED ORDER — HEPARIN (PORCINE) IN NACL 100-0.45 UNIT/ML-% IJ SOLN
1000.0000 [IU]/h | INTRAMUSCULAR | Status: DC
  Administered 2015-11-30: 1000 [IU]/h via INTRAVENOUS
  Filled 2015-11-29: qty 250

## 2015-11-29 MED ORDER — LACTATED RINGERS IV SOLN
INTRAVENOUS | Status: DC | PRN
  Administered 2015-11-29: 14:00:00 via INTRAVENOUS

## 2015-11-29 MED ORDER — FENTANYL CITRATE (PF) 100 MCG/2ML IJ SOLN
INTRAMUSCULAR | Status: AC
  Filled 2015-11-29: qty 2

## 2015-11-29 MED ORDER — PROPOFOL 10 MG/ML IV BOLUS
INTRAVENOUS | Status: DC | PRN
  Administered 2015-11-29 (×2): 20 mg via INTRAVENOUS
  Administered 2015-11-29: 10 mg via INTRAVENOUS
  Administered 2015-11-29: 40 mg via INTRAVENOUS
  Administered 2015-11-29: 20 mg via INTRAVENOUS
  Administered 2015-11-29 (×2): 40 mg via INTRAVENOUS

## 2015-11-29 MED ORDER — FENTANYL CITRATE (PF) 100 MCG/2ML IJ SOLN
INTRAMUSCULAR | Status: DC | PRN
Start: 2015-11-29 — End: 2015-11-29
  Administered 2015-11-29: 50 ug via INTRAVENOUS

## 2015-11-29 NOTE — Progress Notes (Signed)
CSW received consult regarding financial issues. Patient stated that she lives with her boyfriend and receives food stamps but is worried she will need more. Patient also wanted to talk about her concerns over her medical issues. CSW provided resources in [facility] and provided supportive listening.  CSW signing off.  Percell Locus Marissa Lowrey LCSWA [phone]

## 2015-11-29 NOTE — Interval H&P Note (Signed)
History and Physical Interval Note:  11/29/2015 12:46 PM  Lindsay Salazar  has presented today for surgery, with the diagnosis of pancreatic mass  The various methods of treatment have been discussed with the patient and family. After consideration of risks, benefits and other options for treatment, the patient has consented to  Procedure(s): UPPER ENDOSCOPIC ULTRASOUND (EUS) RADIAL (N/A) as a surgical intervention .  The patient's history has been reviewed, patient examined, no change in status, stable for surgery.  I have reviewed the patient's chart and labs.  Questions were answered to the patient's satisfaction.     Milus Banister

## 2015-11-29 NOTE — Progress Notes (Signed)
Heart rate sustaining 140's-150's. Notified Dr Tana Coast and cardiology. Cardiology ordered to give scheduled Cardizem. Will administer & continue to monitor.

## 2015-11-29 NOTE — Anesthesia Preprocedure Evaluation (Addendum)
Anesthesia Evaluation  Patient identified by MRN, date of birth, ID band Patient awake  General Assessment Comment:Past Medical History Past Medical History: Diagnosis Date . Anginal pain ([place])   thought she was having   heartburn . Arthritis   bursitis , fibromyalgia . Arthritis   "right hip" (11/27/2015) . Asthma  . Atrial fibrillation ([place])  . Chronic bronchitis ([place])  . Chronic right hip pain  . COPD (chronic obstructive pulmonary disease) ([place])   wears home o2 prn . Daily headache   "last 2-3 months" (11/27/2015) . Exposure to TB   "mom had it when she was pregnant with me" . Fibromyalgia  . GERD (gastroesophageal reflux disease)   use to have it but not now . Heart murmur   65-  65 years old . Hypertension  . On home oxygen therapy   11/27/2015 "whenever I need it; don't know how many liters" . Pneumonia   "several times" (11/27/2015)    Reviewed: Allergy & Precautions, NPO status , Patient's Chart, lab work & pertinent test results  Airway Mallampati: II  TM Distance: >3 FB Neck ROM: Full    Dental no notable dental hx.    Pulmonary shortness of breath, asthma , pneumonia, COPD, Current Smoker,    Pulmonary exam normal breath sounds clear to auscultation       Cardiovascular hypertension, + angina Normal cardiovascular exam+ Valvular Problems/Murmurs  Rhythm:Regular Rate:Normal  ECHO 06-28-15: Study Conclusions  - Left ventricle: The cavity size was normal. Wall thickness was   normal. Systolic function was normal. The estimated ejection   fraction was 55%. There is akinesis of the midanteroseptal   myocardium. The study is not technically sufficient to allow   evaluation of LV diastolic function. - Aortic valve: Mildly calcified annulus. Trileaflet. There was   trivial regurgitation. - Mitral valve: Mildly thickened leaflets . There was mild   regurgitation. - Left atrium: The  atrium was mildly dilated. - Right ventricle: Systolic function was low normal. - Right atrium: The atrium was mildly dilated. Central venous   pressure (est): 8 mm Hg. - Tricuspid valve: There was moderate regurgitation. - Pulmonary arteries: PA peak pressure: 41 mm Hg (S). - Pericardium, extracardiac: There was no pericardial effusion.  Impressions:  - Normal LV wall thickness with LVEF approximately 55%. There is   akinesis of the mid anteroseptal wall. Indeterminate diastolic   function in the setting of atrial fibrillation/flutter. Mild   biatrial enlargement. Mildly thickened mitral leaflets with mild   mitral regurgitation. Trivial aortic regurgitation. Low normal RV   contraction. Moderate tricuspid regurgitation with PASP 41 mmHg.   Neuro/Psych  Headaches, negative psych ROS   GI/Hepatic Neg liver ROS, GERD  ,  Endo/Other  negative endocrine ROS  Renal/GU negative Renal ROS  negative genitourinary   Musculoskeletal  (+) Arthritis , Fibromyalgia -  Abdominal   Peds negative pediatric ROS (+)  Hematology  (+) anemia ,   Anesthesia Other Findings   Reproductive/Obstetrics negative OB ROS                            Anesthesia Physical Anesthesia Plan  ASA: III  Anesthesia Plan: MAC   Post-op Pain Management:    Induction: Intravenous  Airway Management Planned: Natural Airway  Additional Equipment:   Intra-op Plan:   Post-operative Plan:   Informed Consent: I have reviewed the patients History and Physical, chart, labs and discussed the procedure including the risks, benefits  and alternatives for the proposed anesthesia with the patient or authorized representative who has indicated his/her understanding and acceptance.   Dental advisory given  Plan Discussed with: CRNA  Anesthesia Plan Comments:         Anesthesia Quick Evaluation

## 2015-11-29 NOTE — Progress Notes (Signed)
[facility]                                                                              Patient Demographics  Lindsay Salazar, is a 65 y.o. female, DOB - 15-Aug-1950, DJ:[id number]  Admit date - 11/27/2015   Admitting Physician Karmen Bongo, MD  Outpatient Primary MD for the patient is [name], PA-C  Outpatient specialists:   LOS - 2  days    Chief Complaint  Patient presents with  . Abdominal Pain       Brief summary  65 year old female with history of GERD, hypertension, fibromyalgia, COPD on intermittent home O2, active tobacco use and A. fib on anticoagulation presented to [facility] ED with burning epigastric pain. She was hospitalized in July this year for peptic ulcer disease. Patient reports feeling dizzy intermittently. She is unsure about weight loss and reports that she has not been eating well due to financial crisis and not having enough food at home. In the ED she was in A. fib with occasional RVR. She had leukocytosis and mildly tachypneic meeting SIRS criteria. Blood work showed normal hemoglobin, begin of 22 and creatinine of 1. CT of the abdomen and pelvis showed a hypoechogenic mass in the pancreatic body with Dr. obstruction suggestive of getting adenocarcinoma.Transferred to [facility] for [facility] with FNA.   Assessment & Plan     Pancreatic mass/Adenocarcinoma MRI abdomen for further evaluation. Check CA 19-9. [facility] following, coordinating with Dr. Ardis Hughs for EUS at [facility] long 10/26  SIRS Possibly due to primary issue. No signs of infection. Continue hydration.    Tobacco abuse Counseled on cessation.    Atrial fibrillation (HCC) -Heart rate controlled, Cardizem was increased to Cardizem 60 mg every 6 hours, metoprolol to 25 twice a day. - Switch to long-acting Cardizem once heart rate better controlled.   - Hold Xarelto and continue on heparin drip.    Chronic obstructive pulmonary disease (COPD) ([place]) Continue home  inhalers. Counseled on smoking cessation  Hypokalemia Replenished  Poor home living situation. Patient reported that she is very poor and unable to afford food and neither has running water at home. Social work consulted to provide necessary support.  Code Status: Full CODE STATUS  DVT Prophylaxis:   heparin Family Communication: Discussed in detail with the patient, all imaging results, lab results explained to the patient   Disposition Plan:   Time Spent in minutes 25 minutes  Procedures:    Consultants:   Gastroenterology  Antimicrobials:      Medications  Scheduled Meds: . [MAR Hold] diltiazem  60 mg Oral Q6H  . [MAR Hold] escitalopram  10 mg Oral QPM  . [MAR Hold] ipratropium-albuterol  3 mL Inhalation TID  . [MAR Hold] metoprolol tartrate  25 mg Oral BID  . [MAR Hold] mometasone-formoterol  2 puff Inhalation BID  . [MAR Hold] nicotine  14 mg Transdermal Daily   Continuous Infusions: . sodium chloride 20 mL/hr at 11/29/15 0752   PRN Meds:.[MAR Hold] acetaminophen **OR** [MAR Hold] acetaminophen, [MAR Hold] albuterol, [MAR Hold] HYDROcodone-acetaminophen, [MAR Hold]  morphine injection, [MAR Hold] ondansetron **OR** [MAR Hold] ondansetron (  ZOFRAN) IV   Antibiotics   Anti-infectives    None        Subjective:   Analeise Harakal was seen and examined today. Earlier this morning patient complained about atypical chest pain, like burping and acid reflux. D-dimer was negative. EKG showed no acute ST-T wave changes adjusted of ischemia. Heart rate was controlled   Patient denies dizziness, abdominal pain, N/V/D/C, new weakness, numbess, tingling.   Objective:   Vitals:   11/29/15 0044 11/29/15 0603 11/29/15 0908 11/29/15 1238  BP: (!) 151/89 121/83  (!) 145/76  Pulse: 86 69 84 74  Resp: 18 16 18  (!) 34  Temp:  97.5 F (36.4 C)    TempSrc:  Oral    SpO2: 95% 97% 100% 93%  Weight:      Height:        Intake/Output Summary (Last 24 hours) at 11/29/15  1507 Last data filed at 11/29/15 1138  Gross per 24 hour  Intake               20 ml  Output             1050 ml  Net            -1030 ml     Wt Readings from Last 3 Encounters:  11/27/15 59.4 kg (131 lb)  10/24/15 58.5 kg (129 lb)  10/10/15 59.8 kg (131 lb 12.8 oz)     Exam  General: Alert and oriented x 3, NAD  HEENT:  PERRLA, EOMI, Anicteric Sclera, mucous membranes moist.   Neck: Supple, no JVD, no masses  Cardiovascular: S1 S2 auscultated,irregular,  no rubs, murmurs or gallops  Respiratory: Clear to auscultation bilaterally, no wheezing, rales or rhonchi  Gastrointestinal: Soft, nontender, nondistended, + bowel sounds  Ext: no cyanosis clubbing or edema  Neuro: AAOx3, Cr N's II- XII. Strength 5/5 upper and lower extremities bilaterally  Skin: No rashes  Psych: Normal affect and demeanor, alert and oriented x3    Data Reviewed:  I have personally reviewed following labs and imaging studies  Micro Results No results found for this or any previous visit (from the past 240 hour(s)).  Radiology Reports Mr Abdomen W Wo Contrast  Result Date: 11/28/2015 CLINICAL DATA:  65 year old female inpatient with abdominal pain and newly diagnosed pancreatic body mass on a CT study from 1 day prior. EXAM: MRI ABDOMEN WITHOUT AND WITH CONTRAST TECHNIQUE: Multiplanar multisequence MR imaging of the abdomen was performed both before and after the administration of intravenous contrast. CONTRAST:  41mL MULTIHANCE GADOBENATE DIMEGLUMINE 529 MG/ML IV SOLN COMPARISON:  11/27/2015 CT abdomen/pelvis. FINDINGS: Many of the sequences are motion degraded. Lower chest: Faint tiny clustered nodular opacities in the anterior left lower lobe (series 1404/ image 45), correlating with mild tree-in-bud opacities on the CT study from 1 day prior. Hepatobiliary: Normal liver size and configuration. Mild diffuse hepatic steatosis. There is a simple 6.7 x 6.5 cm posterior left liver lobe cyst. There is  a simple 1.1 cm segment 6 right liver lobe cyst. There are innumerable (> than 30) subcentimeter T2 hyperintense nonenhancing lesions scattered throughout the liver most consistent with biliary hamartomas. No suspicious liver masses. Normal gallbladder with no cholelithiasis. No biliary ductal dilatation. Common bile duct diameter 5 mm. No choledocholithiasis. Pancreas: There is an approximately 5.0 x 2.3 x 2.6 cm distal pancreatic body mass (series 8/ image 20), which demonstrates ill-defined infiltrative appearing margins and heterogeneous hypoenhancement, most consistent with a primary pancreatic adenocarcinoma. There is  marked dilatation of the distal main pancreatic duct within the atrophic pancreatic tail (9 mm diameter). This pancreatic mass appears to encase the splenic artery, which remains patent. This pancreatic mass appears to abut the anterior aspect of the splenic vein, which is mildly narrowed and remains patent. This pancreatic mass appears to involve a small portion of the main portal vein wall at the portosplenic confluence, approximately 20-30% of the main portal vein circumference. Main portal vein remains normal in caliber and patent. Superior mesenteric artery appears uninvolved. No pancreas divisum. Spleen: Normal size. No mass. Adrenals/Urinary Tract: Normal adrenals. No hydronephrosis. Simple 1.7 cm renal cyst in the posterior lower right kidney. Small simple parapelvic renal cysts in the right kidney. No suspicious renal masses. Stomach/Bowel: Grossly normal stomach. Visualized small and large bowel is normal caliber, with no bowel wall thickening. Vascular/Lymphatic: Atherosclerotic nonaneurysmal abdominal aorta. Patent hepatic, left and right portal and renal veins. No pathologically enlarged lymph nodes in the abdomen. Other: No abdominal ascites or focal fluid collection. Musculoskeletal: No aggressive appearing focal osseous lesions. IMPRESSION: 1. Infiltrative hypoenhancing 5.0 x 2.3  x 2.6 cm distal pancreatic body mass, most consistent with a primary pancreatic adenocarcinoma. Atrophy and prominent pancreatic duct dilation in the pancreatic tail. Recommend correlation with endoscopic ultrasound with fine-needle aspiration. 2. Vascular involvement by the pancreatic mass as follows: Encasement of the patent splenic artery. Mild narrowing and abutment of the splenic vein, which remains patent. Mild involvement of the main portal vein wall at the portosplenic confluence. Portal vein remains patent and normal in caliber. 3. No evidence of metastatic disease in the abdomen. 4. Faint tiny clustered nodular opacities in the anterior left lower lobe, correlating with mild tree-in-bud opacities on the CT study from 1 day prior, indicating a mild infectious or inflammatory bronchiolitis, with the differential including mild aspiration. 5. Additional findings include aortic atherosclerosis, mild diffuse hepatic steatosis and benign liver cysts and hamartomas. Electronically Signed   By: Ilona Sorrel M.D.   On: 11/28/2015 13:25   Ct Abdomen Pelvis W Contrast  Result Date: 11/27/2015 CLINICAL DATA:  Abdominal pain.  History of GI bleeding. EXAM: CT ABDOMEN AND PELVIS WITH CONTRAST TECHNIQUE: Multidetector CT imaging of the abdomen and pelvis was performed using the standard protocol following bolus administration of intravenous contrast. CONTRAST:  100 cc Isovue-300 intravenous COMPARISON:  None. FINDINGS: Lower chest:  No nodules. Hepatobiliary: Simple appearing cysts in the inferior left liver measuring nearly 7 cm. Numerous other low-density areas are well-defined and likely cysts or hamartomas, but limited by small size. No definitive liver metastasis. Pancreas: 32 mm long, 25 mm thick hypoenhancing mass in the pancreatic body with ductal obstruction and parenchymal atrophy seen at the level of the tail. On sagittal reformats the mass encases the mid splenic artery and splenic vein with mild splenic  vein narrowing 1-2 cm beyond the portal vein confluence. No infiltration seen around the celiac axis or SMA. No peripancreatic lymph nodes. Spleen: Unremarkable. Adrenals/Urinary Tract: Negative adrenals. No hydronephrosis or stone. Unremarkable bladder. Stomach/Bowel:  No obstruction. No appendicitis. Vascular/Lymphatic: No acute vascular abnormality. Splenic vascular findings described on pancreas section. Atherosclerosis. No mass or adenopathy. Reproductive:No pathologic findings. Other: No ascites or peritoneal nodules. Musculoskeletal: No acute or aggressive finding IMPRESSION: Findings of pancreas body adenocarcinoma as described above. Recommend EUS/FNA. No definitive metastasis, but limited in the liver due to numerous sub-centimeter cysts/biliary hamartomas. Suggest liver staging with MRI. Electronically Signed   By: Monte Fantasia M.D.   On: 11/27/2015 15:09  Dg Chest Port 1 View  Result Date: 11/29/2015 CLINICAL DATA:  Shortness of breath. EXAM: PORTABLE CHEST 1 VIEW COMPARISON:  08/08/2015. FINDINGS: Mediastinum and hilar structures are normal. Heart size normal. Mild bibasilar atelectasis and/or infiltrates. Biapical pleural-parenchymal thickening noted consistent with scarring. No pleural effusion or pneumothorax. IMPRESSION: 1. Mild right base subsegmental atelectasis. Mild right base infiltrate cannot be excluded. 2.  Biapical pleural-parenchymal scarring . Electronically Signed   By: Marcello Moores  Register   On: 11/29/2015 07:57   US Abdomen Limited Ruq  Result Date: 11/27/2015 CLINICAL DATA:  One day of abdominal pain. EXAM: US ABDOMEN LIMITED - RIGHT UPPER QUADRANT COMPARISON:  CT of earlier today. FINDINGS: Gallbladder: No gallstones or wall thickening visualized. No sonographic Murphy sign noted by sonographer. Common bile duct: Diameter: Normal, 6 mm. Liver: 8.0 cm left hepatic lobe cyst, as detailed on CT. This has minimal complexity within. IMPRESSION: Normal gallbladder and biliary  tree. Left hepatic lobe complex cyst. Electronically Signed   By: Abigail Miyamoto M.D.   On: 11/27/2015 16:11    Lab Data:  CBC:  Recent Labs Lab 11/27/15 1154 11/28/15 0505 11/29/15 0413  WBC 24.5* 17.8* 15.8*  HGB 12.1 11.3* 11.1*  HCT 38.3 36.3 35.8*  MCV 79.5 77.6* 78.5  PLT 267 271 [id number]   Basic Metabolic Panel:  Recent Labs Lab 11/27/15 1154 11/28/15 0505  NA 138 135  K 3.3* 3.7  CL 104 103  CO2 26 23  GLUCOSE 128* 110*  BUN 22* 13  CREATININE 1.06* 0.90  CALCIUM 8.7* 8.0*   GFR: Estimated Creatinine Clearance: 52.4 mL/min (by C-G formula based on SCr of 0.9 mg/dL). Liver Function Tests:  Recent Labs Lab 11/27/15 1154  AST 19  ALT 26  ALKPHOS 92  BILITOT 0.5  PROT 6.9  ALBUMIN 3.4*    Recent Labs Lab 11/27/15 1154  LIPASE 19   No results for input(s): AMMONIA in the last 168 hours. Coagulation Profile: No results for input(s): INR, PROTIME in the last 168 hours. Cardiac Enzymes:  Recent Labs Lab 11/27/15 1245 11/29/15 0747  TROPONINI <0.03 <0.03   BNP (last 3 results) No results for input(s): PROBNP in the last 8760 hours. HbA1C: No results for input(s): HGBA1C in the last 72 hours. CBG: No results for input(s): GLUCAP in the last 168 hours. Lipid Profile: No results for input(s): CHOL, HDL, LDLCALC, TRIG, CHOLHDL, LDLDIRECT in the last 72 hours. Thyroid Function Tests: No results for input(s): TSH, T4TOTAL, FREET4, T3FREE, THYROIDAB in the last 72 hours. Anemia Panel: No results for input(s): VITAMINB12, FOLATE, FERRITIN, TIBC, IRON, RETICCTPCT in the last 72 hours. Urine analysis:    Component Value Date/Time   COLORURINE YELLOW 11/27/2015 [place] 11/27/2015 1513   LABSPEC <1.005 (L) 11/27/2015 1513   PHURINE 6.0 11/27/2015 1513   GLUCOSEU NEGATIVE 11/27/2015 1513   HGBUR MODERATE (A) 11/27/2015 1513   BILIRUBINUR NEGATIVE 11/27/2015 1513   KETONESUR NEGATIVE 11/27/2015 1513   PROTEINUR TRACE (A) 11/27/2015  1513   NITRITE NEGATIVE 11/27/2015 1513   LEUKOCYTESUR NEGATIVE 11/27/2015 1513     Cailan General M.D. [facility] 11/29/2015, 3:07 PM  Pager: [phone] Between 7am to 7pm - call Pager - [phone]  After 7pm go to [email or web address] - password [email or web address]  Call night coverage person covering after 7pm

## 2015-11-29 NOTE — Progress Notes (Signed)
I was called today decreased heart rate. Ordered IV Lopressor when necessary and the nurse to call me if there is no improvement. Her p.m. medications were to be given early as well.

## 2015-11-29 NOTE — Progress Notes (Signed)
EKG completed. Results paged to Dr Tana Coast.

## 2015-11-29 NOTE — Transfer of Care (Signed)
Immediate Anesthesia Transfer of Care Note  Patient: Lindsay Salazar  Procedure(s) Performed: Procedure(s): UPPER ENDOSCOPIC ULTRASOUND (EUS) RADIAL (N/A)  Patient Location: PACU  Anesthesia Type:MAC  Level of Consciousness: awake, alert , oriented and patient cooperative  Airway & Oxygen Therapy: Patient Spontanous Breathing and Patient connected to nasal cannula oxygen  Post-op Assessment: Report given to RN, Post -op Vital signs reviewed and stable and Patient moving all extremities  Post vital signs: Reviewed and stable  Last Vitals:  Vitals:   11/29/15 0908 11/29/15 1238  BP:  (!) 145/76  Pulse: 84 74  Resp: 18 (!) 34  Temp:      Last Pain:  Vitals:   11/29/15 1238  TempSrc:   PainSc: 0-No pain      Patients Stated Pain Goal: 3 ([id number])  Complications: No apparent anesthesia complications

## 2015-11-29 NOTE — Anesthesia Postprocedure Evaluation (Signed)
Anesthesia Post Note  Patient: Lindsay Salazar  Procedure(s) Performed: Procedure(s) (LRB): UPPER ENDOSCOPIC ULTRASOUND (EUS) RADIAL (N/A)  Patient location during evaluation: PACU Anesthesia Type: MAC Level of consciousness: awake and alert Pain management: pain level controlled Vital Signs Assessment: post-procedure vital signs reviewed and stable Respiratory status: spontaneous breathing, nonlabored ventilation, respiratory function stable and patient connected to nasal cannula oxygen Cardiovascular status: stable and blood pressure returned to baseline Anesthetic complications: no    Last Vitals:  Vitals:   11/29/15 1238 11/29/15 1515  BP: (!) 145/76 (!) 132/50  Pulse: 74 78  Resp: (!) 34 (!) 35  Temp:  36.7 C    Last Pain:  Vitals:   11/29/15 1515  TempSrc: Oral  PainSc: 0-No pain                 Catalina Gravel

## 2015-11-29 NOTE — Progress Notes (Signed)
Heart rate sustaining 150's-170's. MD notified. Waiting to hear from dr.

## 2015-11-29 NOTE — Progress Notes (Signed)
ANTICOAGULATION CONSULT NOTE  Pharmacy Consult:  Heparin Indication: atrial fibrillation  Allergies  Allergen Reactions  . Ibuprofen Nausea And Vomiting  . Penicillins Swelling    Has patient had a PCN reaction causing immediate rash, facial/tongue/throat swelling, SOB or lightheadedness with hypotension: Yes Has patient had a PCN reaction causing severe rash involving mucus membranes or skin necrosis: No Has patient had a PCN reaction that required hospitalization No Has patient had a PCN reaction occurring within the last 10 years: No If all of the above answers are "NO", then may proceed with Cephalosporin use.   . Tiotropium Bromide Monohydrate Itching  . Tramadol Nausea And Vomiting  . Vicodin [Hydrocodone-Acetaminophen] Nausea And Vomiting    Patient Measurements: Height: 5\' 4"  (162.6 cm) Weight: 131 lb (59.4 kg) IBW/kg (Calculated) : 54.7 Heparin Dosing Weight: 59 kg  Vital Signs: Temp: 97.5 F (36.4 C) (10/26 0603) Temp Source: Oral (10/26 0603) BP: 121/83 (10/26 0603) Pulse Rate: 84 (10/26 0908)  Labs:  Recent Labs  11/27/15 1154 11/27/15 1245 11/28/15 0505 11/28/15 2118 11/29/15 0413 11/29/15 0747  HGB 12.1  --  11.3*  --  11.1*  --   HCT 38.3  --  36.3  --  35.8*  --   PLT 267  --  271  --  287  --   APTT  --   --  52* 67* 55*  --   HEPARINUNFRC  --   --  1.22*  --  0.31  --   CREATININE 1.06*  --  0.90  --   --   --   TROPONINI  --  <0.03  --   --   --  <0.03    Estimated Creatinine Clearance: 52.4 mL/min (by C-G formula based on SCr of 0.9 mg/dL).   Medical History: Past Medical History:  Diagnosis Date  . Anginal pain ([place])    thought she was having   heartburn  . Arthritis    bursitis , fibromyalgia  . Arthritis    "right hip" (11/27/2015)  . Asthma   . Atrial fibrillation ([place])   . Chronic bronchitis ([place])   . Chronic right hip pain   . COPD (chronic obstructive pulmonary disease) ([place])    wears home o2 prn  . Daily headache    "last 2-3 months" (11/27/2015)  . Exposure to TB    "mom had it when she was pregnant with me"  . Fibromyalgia   . GERD (gastroesophageal reflux disease)    use to have it but not now  . Heart murmur    72-  3 years old  . Hypertension   . On home oxygen therapy    11/27/2015 "whenever I need it; don't know how many liters"  . Pneumonia    "several times" (11/27/2015)  . Pneumothorax       Assessment: 65 YOF with newly dx pancreatic adenocarcinoma currently on heparin while Xarelto on hold for planned procedures.   Heparin level therapeutic at 0.31 this morning. Currently heparin is off for planned EUS and potential biopsy that will occur [facility]. After procedure patient is scheduled to return to [facility].    Goal of Therapy:  Heparin level 0.3-0.7 units/ml Monitor platelets by anticoagulation protocol: Yes    Plan:  - Follow up post procedure and restart heparin infusion when feasible   Vincenza Hews, PharmD, BCPS 11/29/2015, 11:11 AM Pager: [phone]

## 2015-11-29 NOTE — Progress Notes (Signed)
During shift change CCMD notified day shift RN pt was in HR was sustaining in 170s. RN notified on call Hijazi, MD. RN to give new ordered dose of 2.5 mg metoprolol x2 for HR >110 and scheduled metoprolol 25 mg per MD. After 2 doses and PO metoprolol pt HR now in 80s-90s. Will continue to monitor pt.

## 2015-11-29 NOTE — Consult Note (Addendum)
Cardiology Consult    Patient ID: Lindsay Salazar MRN: [id number], DOB/AGE: 1950-06-21   Admit date: 11/27/2015 Date of Consult: 11/29/2015  Primary Physician: [name], PA-C Reason for Consult: Chest Pain; Atrial Flutter Primary Cardiologist: Dr. Harl Bowie Requesting Provider: Dr. Tana Coast   History of Present [place] is a 65 y.o. female with past medical history of PAF, HTN, COPD, and tobacco abuse who presented to [facility] on 11/27/2015 for worsening epigastric pain.   Reported developing a burning sensation along her epigastrium after consuming supper two nights ago. Says the pain radiated into her chest and reported associated diaphoresis. Says it felt like an alternating burning/cold sensation. Had been hospitalized in 08/2015 for a GIB secondary to peptic ulcer disease. She was transfused with 2 units pRBC's and treated for H. Pylori. Says her presenting symptoms were similar to what she experienced in 08/2015.  Initial labs showed a WBC of 24.5, Hgb 12.1, and platelets 267. K+ 3.3. Creatinine 1.06. Initial troponin negative. RUQ US showed normal GB and biliary tree. Abdominal MRI showed infiltrative hypoenhancing 5.0 x 2.3 x 2.6 cm distal pancreatic body mass, most consistent with a primary pancreatic adenocarcinoma.  Upper endoscopic [place] was performed today along with FNA. A 4.2cm mass in the body of pancreas causing main pancreatic duct obstruction (and dilation) with preliminary cytology review from FNA being positive for malignancy with final report pending.   Just recently learned the preliminary results of her [place]. Says she is anxious about the treatment options concerning this. Has experienced increased palpitations this afternoon. Of note, her Cardizem and Lopressor doses have been held throughout the day due to her procedure. Still having epigastric pain at the time of this examination.    Past Medical History   Past Medical History:  Diagnosis  Date  . Anginal pain ([place])    thought she was having   heartburn  . Arthritis    bursitis , fibromyalgia  . Arthritis    "right hip" (11/27/2015)  . Asthma   . Atrial fibrillation ([place])   . Chronic bronchitis ([place])   . Chronic right hip pain   . COPD (chronic obstructive pulmonary disease) ([place])    wears home o2 prn  . Daily headache    "last 2-3 months" (11/27/2015)  . Exposure to TB    "mom had it when she was pregnant with me"  . Fibromyalgia   . GERD (gastroesophageal reflux disease)    use to have it but not now  . Heart murmur    33-  89 years old  . Hypertension   . On home oxygen therapy    11/27/2015 "whenever I need it; don't know how many liters"  . Pneumonia    "several times" (11/27/2015)  . Pneumothorax     Past Surgical History:  Procedure Laterality Date  . ANTERIOR CERVICAL DECOMP/DISCECTOMY FUSION     "put 4 screws in"  . BACK SURGERY    . CARDIAC CATHETERIZATION  08/2004   Archie Endo 06/18/2010  . CARDIOVERSION N/A 07/18/2015   Procedure: CARDIOVERSION;  Surgeon: Josue Hector, MD;  Location: [facility];  Service: Cardiovascular;  Laterality: N/A;  . ESOPHAGOGASTRODUODENOSCOPY N/A 08/09/2015   Procedure: ESOPHAGOGASTRODUODENOSCOPY (EGD);  Surgeon: Rogene Houston, MD;  Location: [facility];  Service: Endoscopy;  Laterality: N/A;  . TONSILLECTOMY    . TUBAL LIGATION       Allergies  Allergies  Allergen Reactions  .  Ibuprofen Nausea And Vomiting  . Penicillins Swelling    Has patient had a PCN reaction causing immediate rash, facial/tongue/throat swelling, SOB or lightheadedness with hypotension: Yes Has patient had a PCN reaction causing severe rash involving mucus membranes or skin necrosis: No Has patient had a PCN reaction that required hospitalization No Has patient had a PCN reaction occurring within the last 10 years: No If all of the above answers are "NO", then may proceed with Cephalosporin use.   . Tiotropium Bromide Monohydrate Itching   . Tramadol Nausea And Vomiting  . Vicodin [Hydrocodone-Acetaminophen] Nausea And Vomiting    Inpatient Medications    . diltiazem  60 mg Oral Q6H  . escitalopram  10 mg Oral QPM  . ipratropium-albuterol  3 mL Inhalation TID  . metoprolol tartrate  25 mg Oral BID  . mometasone-formoterol  2 puff Inhalation BID  . nicotine  14 mg Transdermal Daily    Family History    Family History  Problem Relation Age of Onset  . COPD Mother   . Diabetes Father   . Stroke Father     Social History    Social History   Social History  . Marital status: Divorced    Spouse name: N/A  . Number of children: N/A  . Years of education: N/A   Occupational History  . retired    Social History Main Topics  . Smoking status: Current Every Day Smoker    Packs/day: 0.50    Years: 46.00    Types: Cigarettes    Start date: 10/09/1969  . Smokeless tobacco: Never Used  . Alcohol use No     Comment: quit 30 years ago  . Drug use: No  . Sexual activity: Not Currently    Birth control/ protection: Surgical, Post-menopausal   Other Topics Concern  . Not on file   Social History Narrative  . No narrative on file     Review of Systems    General:  No chills, fever, night sweats or weight changes.  Cardiovascular:  No dyspnea on exertion, edema, orthopnea, paroxysmal nocturnal dyspnea. Positive for chest pain and palpitations.  Dermatological: No rash, lesions/masses Respiratory: No cough, dyspnea Urologic: No hematuria, dysuria Abdominal:   No nausea, vomiting, diarrhea, bright red blood per rectum, melena, or hematemesis Neurologic:  No visual changes, wkns, changes in mental status. All other systems reviewed and are otherwise negative except as noted above.  Physical Exam    Blood pressure (!) 130/96, pulse 100, temperature 98.2 F (36.8 C), temperature source Oral, resp. rate (!) 22, height 5\' 4"  (1.626 m), weight 131 lb (59.4 kg), SpO2 91 %.  General: Pleasant, NAD Psych: Normal  affect. Neuro: Alert and oriented X 3. Moves all extremities spontaneously. HEENT: Normal  Neck: Supple without bruits or JVD. Lungs: mild wheezing bilaterally Heart: irreg, rate 150, no m/r/g, no jv Abdomen: Soft, non-tender, non-distended, BS + x 4.  Extremities: No clubbing, cyanosis or edema. DP/PT/Radials 2+ and equal bilaterally.  Exam completed by attending Dr Barron Alvine    Troponin [facility] Main of Care Test) No results for input(s): [place] in the last 72 hours.  Recent Labs  11/27/15 1245 11/29/15 0747  TROPONINI <0.03 <0.03   Lab Results  Component Value Date   WBC 15.8 (H) 11/29/2015   HGB 11.1 (L) 11/29/2015   HCT 35.8 (L) 11/29/2015   MCV 78.5 11/29/2015   PLT 287 11/29/2015    Recent Labs Lab 11/27/15 1154 11/28/15 0505  NA  138 135  K 3.3* 3.7  CL 104 103  CO2 26 23  BUN 22* 13  CREATININE 1.06* 0.90  CALCIUM 8.7* 8.0*  PROT 6.9  --   BILITOT 0.5  --   ALKPHOS 92  --   ALT 26  --   AST 19  --   GLUCOSE 128* 110*   No results found for: CHOL, HDL, LDLCALC, TRIG Lab Results  Component Value Date   DDIMER 0.32 11/29/2015     Radiology Studies    Mr Abdomen W Wo Contrast  Result Date: 11/28/2015 CLINICAL DATA:  65 year old female inpatient with abdominal pain and newly diagnosed pancreatic body mass on a CT study from 1 day prior. EXAM: MRI ABDOMEN WITHOUT AND WITH CONTRAST TECHNIQUE: Multiplanar multisequence MR imaging of the abdomen was performed both before and after the administration of intravenous contrast. CONTRAST:  42mL MULTIHANCE GADOBENATE DIMEGLUMINE 529 MG/ML IV SOLN COMPARISON:  11/27/2015 CT abdomen/pelvis. FINDINGS: Many of the sequences are motion degraded. Lower chest: Faint tiny clustered nodular opacities in the anterior left lower lobe (series 1404/ image 45), correlating with mild tree-in-bud opacities on the CT study from 1 day prior. Hepatobiliary: Normal liver size and configuration. Mild diffuse hepatic steatosis. There is a  simple 6.7 x 6.5 cm posterior left liver lobe cyst. There is a simple 1.1 cm segment 6 right liver lobe cyst. There are innumerable (> than 30) subcentimeter T2 hyperintense nonenhancing lesions scattered throughout the liver most consistent with biliary hamartomas. No suspicious liver masses. Normal gallbladder with no cholelithiasis. No biliary ductal dilatation. Common bile duct diameter 5 mm. No choledocholithiasis. Pancreas: There is an approximately 5.0 x 2.3 x 2.6 cm distal pancreatic body mass (series 8/ image 20), which demonstrates ill-defined infiltrative appearing margins and heterogeneous hypoenhancement, most consistent with a primary pancreatic adenocarcinoma. There is marked dilatation of the distal main pancreatic duct within the atrophic pancreatic tail (9 mm diameter). This pancreatic mass appears to encase the splenic artery, which remains patent. This pancreatic mass appears to abut the anterior aspect of the splenic vein, which is mildly narrowed and remains patent. This pancreatic mass appears to involve a small portion of the main portal vein wall at the portosplenic confluence, approximately 20-30% of the main portal vein circumference. Main portal vein remains normal in caliber and patent. Superior mesenteric artery appears uninvolved. No pancreas divisum. Spleen: Normal size. No mass. Adrenals/Urinary Tract: Normal adrenals. No hydronephrosis. Simple 1.7 cm renal cyst in the posterior lower right kidney. Small simple parapelvic renal cysts in the right kidney. No suspicious renal masses. Stomach/Bowel: Grossly normal stomach. Visualized small and large bowel is normal caliber, with no bowel wall thickening. Vascular/Lymphatic: Atherosclerotic nonaneurysmal abdominal aorta. Patent hepatic, left and right portal and renal veins. No pathologically enlarged lymph nodes in the abdomen. Other: No abdominal ascites or focal fluid collection. Musculoskeletal: No aggressive appearing focal osseous  lesions. IMPRESSION: 1. Infiltrative hypoenhancing 5.0 x 2.3 x 2.6 cm distal pancreatic body mass, most consistent with a primary pancreatic adenocarcinoma. Atrophy and prominent pancreatic duct dilation in the pancreatic tail. Recommend correlation with endoscopic ultrasound with fine-needle aspiration. 2. Vascular involvement by the pancreatic mass as follows: Encasement of the patent splenic artery. Mild narrowing and abutment of the splenic vein, which remains patent. Mild involvement of the main portal vein wall at the portosplenic confluence. Portal vein remains patent and normal in caliber. 3. No evidence of metastatic disease in the abdomen. 4. Faint tiny clustered nodular opacities in the anterior  left lower lobe, correlating with mild tree-in-bud opacities on the CT study from 1 day prior, indicating a mild infectious or inflammatory bronchiolitis, with the differential including mild aspiration. 5. Additional findings include aortic atherosclerosis, mild diffuse hepatic steatosis and benign liver cysts and hamartomas. Electronically Signed   By: Ilona Sorrel M.D.   On: 11/28/2015 13:25   Ct Abdomen Pelvis W Contrast  Result Date: 11/27/2015 CLINICAL DATA:  Abdominal pain.  History of GI bleeding. EXAM: CT ABDOMEN AND PELVIS WITH CONTRAST TECHNIQUE: Multidetector CT imaging of the abdomen and pelvis was performed using the standard protocol following bolus administration of intravenous contrast. CONTRAST:  100 cc Isovue-300 intravenous COMPARISON:  None. FINDINGS: Lower chest:  No nodules. Hepatobiliary: Simple appearing cysts in the inferior left liver measuring nearly 7 cm. Numerous other low-density areas are well-defined and likely cysts or hamartomas, but limited by small size. No definitive liver metastasis. Pancreas: 32 mm long, 25 mm thick hypoenhancing mass in the pancreatic body with ductal obstruction and parenchymal atrophy seen at the level of the tail. On sagittal reformats the mass  encases the mid splenic artery and splenic vein with mild splenic vein narrowing 1-2 cm beyond the portal vein confluence. No infiltration seen around the celiac axis or SMA. No peripancreatic lymph nodes. Spleen: Unremarkable. Adrenals/Urinary Tract: Negative adrenals. No hydronephrosis or stone. Unremarkable bladder. Stomach/Bowel:  No obstruction. No appendicitis. Vascular/Lymphatic: No acute vascular abnormality. Splenic vascular findings described on pancreas section. Atherosclerosis. No mass or adenopathy. Reproductive:No pathologic findings. Other: No ascites or peritoneal nodules. Musculoskeletal: No acute or aggressive finding IMPRESSION: Findings of pancreas body adenocarcinoma as described above. Recommend EUS/FNA. No definitive metastasis, but limited in the liver due to numerous sub-centimeter cysts/biliary hamartomas. Suggest liver staging with MRI. Electronically Signed   By: Monte Fantasia M.D.   On: 11/27/2015 15:09   Dg Chest Port 1 View  Result Date: 11/29/2015 CLINICAL DATA:  Shortness of breath. EXAM: PORTABLE CHEST 1 VIEW COMPARISON:  08/08/2015. FINDINGS: Mediastinum and hilar structures are normal. Heart size normal. Mild bibasilar atelectasis and/or infiltrates. Biapical pleural-parenchymal thickening noted consistent with scarring. No pleural effusion or pneumothorax. IMPRESSION: 1. Mild right base subsegmental atelectasis. Mild right base infiltrate cannot be excluded. 2.  Biapical pleural-parenchymal scarring . Electronically Signed   By: Marcello Moores  Register   On: 11/29/2015 07:57   US Abdomen Limited Ruq  Result Date: 11/27/2015 CLINICAL DATA:  One day of abdominal pain. EXAM: US ABDOMEN LIMITED - RIGHT UPPER QUADRANT COMPARISON:  CT of earlier today. FINDINGS: Gallbladder: No gallstones or wall thickening visualized. No sonographic Murphy sign noted by sonographer. Common bile duct: Diameter: Normal, 6 mm. Liver: 8.0 cm left hepatic lobe cyst, as detailed on CT. This has minimal  complexity within. IMPRESSION: Normal gallbladder and biliary tree. Left hepatic lobe complex cyst. Electronically Signed   By: Abigail Miyamoto M.D.   On: 11/27/2015 16:11    EKG & Cardiac Imaging    EKG: Atrial flutter, HR 73, with lateral ST depression (smilar to previous tracings).   Echocardiogram: 06/2015 Study Conclusions  - Left ventricle: The cavity size was normal. Wall thickness was   normal. Systolic function was normal. The estimated ejection   fraction was 55%. There is akinesis of the midanteroseptal   myocardium. The study is not technically sufficient to allow   evaluation of LV diastolic function. - Aortic valve: Mildly calcified annulus. Trileaflet. There was   trivial regurgitation. - Mitral valve: Mildly thickened leaflets . There was mild  regurgitation. - Left atrium: The atrium was mildly dilated. - Right ventricle: Systolic function was low normal. - Right atrium: The atrium was mildly dilated. Central venous   pressure (est): 8 mm Hg. - Tricuspid valve: There was moderate regurgitation. - Pulmonary arteries: PA peak pressure: 41 mm Hg (S). - Pericardium, extracardiac: There was no pericardial effusion.  Impressions:  - Normal LV wall thickness with LVEF approximately 55%. There is   akinesis of the mid anteroseptal wall. Indeterminate diastolic   function in the setting of atrial fibrillation/flutter. Mild   biatrial enlargement. Mildly thickened mitral leaflets with mild   mitral regurgitation. Trivial aortic regurgitation. Low normal RV   contraction. Moderate tricuspid regurgitation with PASP 41 mmHg.  Assessment & Plan  Management per attending recommendations below  Signed, Erma Heritage, PA-C 11/29/2015, 5:40 PM Pager: [phone]   Attending Note I have seen patient and discussed with PA Ahmed Prima, agree with her documentation.    65 yo female with history of afib/aflutter with prior DCCV 07/2015, COPD, fibromyalgia, HTN. After DCCV  she later went back into afib at f/u. In the past medication compliance has been an issue, including inhalers for her COPD and her diuterics. Medical costs have mainly limited her compliance.  Admit 08/2015 with GI bleed, EGD showed PUD, severe anemia reuqiring 2 units of PRBCs. She was eventually able to restart xarelto.   Admit 11/27/15 with epigastric pain. CT imaging with evidence of pancreatic mass, concerning for malignancy. She is undergoing workup by primary team and GI. Cardiology is consulted for alutter with RVR and chest pain. Atypical sharp epgastric and left sied chest pain in setting of aflutter with RVR, COPD wheezing, and new diagnosis of pancreatic mass. Not consistent with ACS, she would not be a candidate at this time for invasive testing if proved to be ACS. Treat aflutter, COPD and continue workup of mass and follow symptoms. She is on rate control with dilt 60mg  q6hrs, metoprolol 25mg  bid. Does not appears she got her afteroon AV nodal agents, perhaps due to her procedure today, and perhaps why rates are currentlly elevated. Just received dilt at 630pm, due lopressor oral at 10pm. We will write for lopressor IV 5mg  x 1now and retry up to 3 doses. Hopefully will bring rates down on the floor, if not would consider transfer to step down for dilt gtt. Restart anticoag when ok from GI standpoint, she had a biopsy today.   Zandra Abts MD

## 2015-11-29 NOTE — Op Note (Addendum)
[facility] Patient Name: Lindsay [facility]azar Procedure Date: 11/29/2015 MRN: [id number] Attending MD: Milus Banister , MD Date of Birth: August 30, 1950 CSN: [id number] Age: 65 Admit Type: Outpatient Procedure:                Upper EUS Indications:              Suspected mass in pancreas on CT scan Providers:                Milus Banister, MD, Zenon Mayo, RN, William Dalton, Technician Referring MD:              Medicines:                Monitored Anesthesia Care Complications:            No immediate complications. Estimated blood loss:                            None. Estimated Blood Loss:     Estimated blood loss: none. Procedure:                Pre-Anesthesia Assessment:                           - Prior to the procedure, a History and Physical                            was performed, and patient medications and                            allergies were reviewed. The patient's tolerance of                            previous anesthesia was also reviewed. The risks                            and benefits of the procedure and the sedation                            options and risks were discussed with the patient.                            All questions were answered, and informed consent                            was obtained. Prior Anticoagulants: The patient has                            taken Xarelto (rivaroxaban), last dose was 2 days                            prior to procedure. ASA Grade Assessment: III - A  patient with severe systemic disease. After                            reviewing the risks and benefits, the patient was                            deemed in satisfactory condition to undergo the                            procedure.                           After obtaining informed consent, the endoscope was                            passed under direct vision. Throughout the     procedure, the patient's blood pressure, pulse, and                            oxygen saturations were monitored continuously. The                            was introduced through the mouth, and advanced to                            the second part of duodenum. The upper EUS was                            accomplished without difficulty. The patient                            tolerated the procedure well. Findings:      Endoscopic Finding :      1. The examined esophagus was endoscopically normal.      2. The entire examined stomach was endoscopically normal.      3. The examined duodenum was endoscopically normal.      Endosonographic Finding :      1. A mass was identified in the pancreatic body. The mass was hypoechoic       and heterogenous. The mass measured 42 mm in maximal cross-sectional       diameter. The endosonographic borders were well-defined. The mass       appears to focally abut the confluence of the portal vein/SMV but does       not involve the celiac trunk or SMA. The remainder of the pancreas was       examined and was normal except for upstream dilation of the main       pancreatic duct (up to 6-67mm) Fine needle aspiration for cytology was       performed. Two passes were made with the 25 gauge needle using a       transgastric approach. A cytotechnologist was present to evaluate the       adequacy of the specimen.      2. No peripancreatic adenopathy.      3. CBD was normal, non-dilated      4. Gallbladder was normal.      5. Limited view of  the liver, spleen were all normal. Impression:               - 4.2cm mass in the body of pancreas causing main                            pancreatic duct obstruction (and dilation) and                            focally involving the portosplenic vein confluence.                            The mass does not involve the SMA, celiac trunk.                           - Preliminary cytology review from FNA is positive                             for malignancy, adenocarcinoma (final report                            pending). Moderate Sedation:      N/A- Per Anesthesia Care Recommendation:           - Return patient to hospital ward for ongoing care.                           - Would consider in patient oncologic and surgical                            consultations.                           - OK to resume her blood thinners tomorrow. Procedure Code(s):        --- Professional ---                           [phone], Esophagogastroduodenoscopy, flexible,                            transoral; with transendoscopic ultrasound-guided                            intramural or transmural fine needle                            aspiration/biopsy(s), (includes endoscopic                            ultrasound examination limited to the esophagus,                            stomach or duodenum, and adjacent structures) Diagnosis Code(s):        --- Professional ---                           K86.89, Other specified diseases of pancreas  R93.3, Abnormal findings on diagnostic imaging of                            other parts of digestive tract CPT copyright 2016 [employer]. All rights reserved. The codes documented in this report are preliminary and upon coder review may  be revised to meet current compliance requirements. Milus Banister, MD 11/29/2015 3:12:07 PM This report has been signed electronically. Number of Addenda: 0

## 2015-11-29 NOTE — H&P (View-Only) (Signed)
[facility] Consult Note   History Lindsay Salazar MRN # [id number]  Date of Admission: 11/27/2015 Date of Consultation: 11/28/2015 Referring physician: Dr. Louellen Molder, MD  Reason for Consultation/Chief Complaint: Pancreatic mass  Subjective  HPI:  This is a 65 year old woman transferred overnight from [facility], having come in earlier that day complaining of epigastric burning. She had been diagnosed with a pyloric ulcer in July of this year, felt likely related to aspirin and NSAID use. She thought she was having a flare of the ulcer, but a CT scan yesterday revealed a mass in the pancreatic body. It appears to be locally advanced, encasing regional blood vessels. She was also tachycardic and had a leukocytosis of 24.5 thousand. There was concern for SIRS, and apparently the GI physician at [facility] was concerned there may be some necrotic process related to the mass causing his clinical picture. She was therefore transferred to [facility] "to expedite scheduling of an EUS/FNA". The patient also appears to have very few resources, with difficulty obtaining medicines food and transportation. She says that she has minimal epigastric burning pain today. She is very anxious and concerned since the diagnosis of a mass yesterday.  She has history of atrial fibrillation requiring cardioversion earlier this year. She is maintained on chronic C, the last dose was taken about 36 hours ago, Monday evening. The medicine is been held and she is on IV heparin. She was found to be in A. fib with RVR, and so far only on oral meds for rate control. She only just arrived on the medical floor in the last 90 minutes, she has been kept nothing by mouth and is wondering when her procedure will be done.  ROS:  Constitutional:  She probably has lost some weight but is uncertain and is a very poor historian. She reports that she often does not have enough money for food. However,  she continues to smoke cigarettes. Cardiovascular:   She was having some palpitations yesterday Respiratory:   Chronic dyspnea with exertion, uses oxygen "from time to time" She has chronic musculoskeletal pain from fibromyalgia  All other systems are negative except as noted above in the HPI  Past Medical History Past Medical History:  Diagnosis Date  . Anginal pain ([place])    thought she was having   heartburn  . Arthritis    bursitis , fibromyalgia  . Arthritis    "right hip" (11/27/2015)  . Asthma   . Atrial fibrillation ([place])   . Chronic bronchitis ([place])   . Chronic right hip pain   . COPD (chronic obstructive pulmonary disease) ([place])    wears home o2 prn  . Daily headache    "last 2-3 months" (11/27/2015)  . Exposure to TB    "mom had it when she was pregnant with me"  . Fibromyalgia   . GERD (gastroesophageal reflux disease)    use to have it but not now  . Heart murmur    23-  42 years old  . Hypertension   . On home oxygen therapy    11/27/2015 "whenever I need it; don't know how many liters"  . Pneumonia    "several times" (11/27/2015)  . Pneumothorax     Past Surgical History Past Surgical History:  Procedure Laterality Date  . ANTERIOR CERVICAL DECOMP/DISCECTOMY FUSION     "put 4 screws in"  . BACK SURGERY    . CARDIAC CATHETERIZATION  08/2004   Archie Endo 06/18/2010  . CARDIOVERSION  N/A 07/18/2015   Procedure: CARDIOVERSION;  Surgeon: Josue Hector, MD;  Location: [facility] END[facility] SUITE;  Service: Cardiovascular;  Laterality: N/A;  . ESOPHAGOGASTRODUODENOSCOPY N/A 08/09/2015   Procedure: ESOPHAGOGASTRODUODENOSCOPY (EGD);  Surgeon: Rogene Houston, MD;  Location: [facility] END[facility] SUITE;  Service: Endoscopy;  Laterality: N/A;  . TONSILLECTOMY    . TUBAL LIGATION      Family History Family History  Problem Relation Age of Onset  . COPD Mother   . Diabetes Father   . Stroke Father     Social History Social History   Social History  . Marital status: Divorced    Spouse  name: N/A  . Number of children: N/A  . Years of education: N/A   Occupational History  . retired    Social History Main Topics  . Smoking status: Current Every Day Smoker    Packs/day: 0.50    Years: 46.00    Types: Cigarettes    Start date: 10/09/1969  . Smokeless tobacco: Never Used  . Alcohol use No     Comment: quit 30 years ago  . Drug use: No  . Sexual activity: Not Currently    Birth control/ protection: Surgical, Post-menopausal   Other Topics Concern  . None   Social History Narrative  . None    Allergies Allergies  Allergen Reactions  . Ibuprofen Nausea And Vomiting  . Penicillins Swelling    Has patient had a PCN reaction causing immediate rash, facial/tongue/throat swelling, SOB or lightheadedness with hypotension: Yes Has patient had a PCN reaction causing severe rash involving mucus membranes or skin necrosis: No Has patient had a PCN reaction that required hospitalization No Has patient had a PCN reaction occurring within the last 10 years: No If all of the above answers are "NO", then may proceed with Cephalosporin use.   . Tiotropium Bromide Monohydrate Itching  . Tramadol Nausea And Vomiting  . Vicodin [Hydrocodone-Acetaminophen] Nausea And Vomiting    Outpatient Meds Home medications from the H+P and/or nursing med reconciliation reviewed.  Inpatient med list reviewed  _____________________________________________________________________ Objective   Exam:  Current vital signs  Patient Vitals for the past 8 hrs:  BP Temp Temp src Pulse Resp SpO2  11/28/15 0802 135/60 - - - - -  11/28/15 0739 - - - - - 98 %  11/28/15 0604 (!) 138/57 99 F (37.2 C) Oral 85 18 95 %    Intake/Output Summary (Last 24 hours) at 11/28/15 0913 Last data filed at 11/28/15 0600  Gross per 24 hour  Intake            52.84 ml  Output              200 ml  Net          -147.16 ml    Physical Exam:  Her pulse is approximately 110 and irregular. She has not  yet been placed on the central telemetry monitor.  General: this is a Chronically ill-appearing, unkempt female patient in no acute distress, she looks older than stated age.  Eyes: sclera anicteric, no redness  ENT: oral mucosa moist without lesions, no cervical or supraclavicular lymphadenopathy, poor dentition  CV: RRR without murmur, S1/S2, no JVD,, no peripheral edema  Resp: Diffusely decreased breath sounds to auscultation bilaterally, normal RR and effort noted, no wheezing  GI: soft, no tenderness, with active bowel sounds. No guarding or palpable organomegaly noted  Skin; warm and dry, no rash or jaundice noted  Neuro: awake, alert  and oriented x 3. Normal gross motor function and fluent speech.  Labs:   Recent Labs Lab 11/27/15 1154 11/28/15 0505  WBC 24.5* 17.8*  HGB 12.1 11.3*  HCT 38.3 36.3  PLT 267 271    Recent Labs Lab 11/27/15 1154 11/28/15 0505  NA 138 135  K 3.3* 3.7  CL 104 103  CO2 26 23  BUN 22* 13  ALBUMIN 3.4*  --   ALKPHOS 92  --   ALT 26  --   AST 19  --   GLUCOSE 128* 110*   No results for input(s): INR in the last 168 hours.  Radiologic studies: I personally reviewed the CT scan images, revealing a mass in the body of the pancreas apparent involvement of regional blood vessels. There are numerous small liver lesions that are not felt likely to be metastasis. See full report for details.  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 fib with RVR Unspecified weight loss   Plan:  The patient needs an EUS/FNA, but I am not certain she will yet be ready to have this done tomorrow. It would be done by our partner Dr. Ardis Hughs, and they are only done@[facility] and usually on the available Thursday block time. It would require ambulance transfer to [facility] for the procedure. I do not yet know if she would stay there afterwards or  be transferred back to [facility]. I am not yet certain she will be medically ready to have this done tomorrow since she is still in rapid A. Fib.  I have ordered a CA 19-9 She can eat today as she is certainly not having an endoscopic procedure today: Diet orders were written.  We will communicate with the hospitalist and suggest that perhaps a Cardizem drip may be helpful to get her A. fib under control in an effort to get her ready for an endoscopic procedure as soon as possible. We certainly don't want her to be in the hospital any longer than necessary, and yet I understand there are socioeconomic obstacles to her having this done as an outpatient.  Remain on IV heparin for now, it can always be stopped several hours prior to the procedure. Do not resume Jenera yet   Thank you for the courtesy of this consult.  Please contact me with any questions or concerns.  [place] III Pager: [phone] Mon-Fri 8a-5p [phone] after 5p, weekends, holidays  CC: Dr.Rehman, [facility]

## 2015-11-30 ENCOUNTER — Encounter (HOSPITAL_COMMUNITY): Payer: Self-pay | Admitting: Gastroenterology

## 2015-11-30 DIAGNOSIS — C7989 Secondary malignant neoplasm of other specified sites: Secondary | ICD-10-CM

## 2015-11-30 DIAGNOSIS — K869 Disease of pancreas, unspecified: Secondary | ICD-10-CM

## 2015-11-30 DIAGNOSIS — I482 Chronic atrial fibrillation: Secondary | ICD-10-CM

## 2015-11-30 DIAGNOSIS — C250 Malignant neoplasm of head of pancreas: Secondary | ICD-10-CM

## 2015-11-30 DIAGNOSIS — D72829 Elevated white blood cell count, unspecified: Secondary | ICD-10-CM

## 2015-11-30 DIAGNOSIS — D509 Iron deficiency anemia, unspecified: Secondary | ICD-10-CM

## 2015-11-30 DIAGNOSIS — J42 Unspecified chronic bronchitis: Secondary | ICD-10-CM

## 2015-11-30 DIAGNOSIS — F17210 Nicotine dependence, cigarettes, uncomplicated: Secondary | ICD-10-CM

## 2015-11-30 LAB — CBC
HEMATOCRIT: 35.6 % — AB (ref 36.0–46.0)
HEMOGLOBIN: 11.1 g/dL — AB (ref 12.0–15.0)
MCH: 24.9 pg — AB (ref 26.0–34.0)
MCHC: 31.2 g/dL (ref 30.0–36.0)
MCV: 79.8 fL (ref 78.0–100.0)
Platelets: 299 10*3/uL (ref 150–400)
RBC: 4.46 MIL/uL (ref 3.87–5.11)
RDW: 18.4 % — ABNORMAL HIGH (ref 11.5–15.5)
WBC: 19.1 10*3/uL — ABNORMAL HIGH (ref 4.0–10.5)

## 2015-11-30 LAB — HEPARIN LEVEL (UNFRACTIONATED)
Heparin Unfractionated: 0.16 IU/mL — ABNORMAL LOW (ref 0.30–0.70)
Heparin Unfractionated: 0.2 IU/mL — ABNORMAL LOW (ref 0.30–0.70)

## 2015-11-30 LAB — CANCER ANTIGEN 19-9: CA 19-9: 194 U/mL — ABNORMAL HIGH (ref 0–35)

## 2015-11-30 MED ORDER — DOXYCYCLINE HYCLATE 100 MG PO TABS
100.0000 mg | ORAL_TABLET | Freq: Two times a day (BID) | ORAL | Status: DC
  Administered 2015-11-30 – 2015-12-03 (×7): 100 mg via ORAL
  Filled 2015-11-30 (×7): qty 1

## 2015-11-30 MED ORDER — BENZONATATE 100 MG PO CAPS
100.0000 mg | ORAL_CAPSULE | Freq: Three times a day (TID) | ORAL | Status: DC
Start: 2015-11-30 — End: 2015-12-03
  Administered 2015-11-30 – 2015-12-03 (×10): 100 mg via ORAL
  Filled 2015-11-30 (×10): qty 1

## 2015-11-30 MED ORDER — METOPROLOL TARTRATE 25 MG PO TABS
37.5000 mg | ORAL_TABLET | Freq: Two times a day (BID) | ORAL | Status: DC
  Administered 2015-11-30 – 2015-12-03 (×7): 37.5 mg via ORAL
  Filled 2015-11-30 (×7): qty 1

## 2015-11-30 MED ORDER — GUAIFENESIN-CODEINE 100-10 MG/5ML PO SOLN
5.0000 mL | Freq: Four times a day (QID) | ORAL | Status: DC | PRN
  Administered 2015-12-01 – 2015-12-03 (×6): 5 mL via ORAL
  Filled 2015-11-30 (×6): qty 5

## 2015-11-30 MED ORDER — HEPARIN (PORCINE) IN NACL 100-0.45 UNIT/ML-% IJ SOLN
1400.0000 [IU]/h | INTRAMUSCULAR | Status: DC
  Administered 2015-11-30: 1150 [IU]/h via INTRAVENOUS
  Administered 2015-12-01: 1300 [IU]/h via INTRAVENOUS
  Filled 2015-11-30: qty 250

## 2015-11-30 NOTE — Progress Notes (Signed)
ANTICOAGULATION CONSULT NOTE - Follow Up Consult  Pharmacy Consult for Heparin (Xarelto on hold) Indication: atrial fibrillation  Allergies  Allergen Reactions  . Ibuprofen Nausea And Vomiting  . Penicillins Swelling    Has patient had a PCN reaction causing immediate rash, facial/tongue/throat swelling, SOB or lightheadedness with hypotension: Yes Has patient had a PCN reaction causing severe rash involving mucus membranes or skin necrosis: No Has patient had a PCN reaction that required hospitalization No Has patient had a PCN reaction occurring within the last 10 years: No If all of the above answers are "NO", then may proceed with Cephalosporin use.   . Tiotropium Bromide Monohydrate Itching  . Tramadol Nausea And Vomiting  . Vicodin [Hydrocodone-Acetaminophen] Nausea And Vomiting    Patient Measurements: Height: 5\' 4"  (162.6 cm) Weight: 131 lb (59.4 kg) IBW/kg (Calculated) : 54.7   Vital Signs: Temp: 98.9 F (37.2 C) (10/27 2130) Temp Source: Oral (10/27 2130) BP: 130/71 (10/27 2130) Pulse Rate: 121 (10/27 2130)  Labs:  Recent Labs  11/28/15 0505 11/28/15 2118 11/29/15 0413 11/29/15 0747 11/30/15 0639 11/30/15 1607 11/30/15 2313  HGB 11.3*  --  11.1*  --  11.1*  --   --   HCT 36.3  --  35.8*  --  35.6*  --   --   PLT 271  --  287  --  299  --   --   APTT 52* 67* 55*  --   --   --   --   HEPARINUNFRC 1.22*  --  0.31  --   --  0.16* 0.20*  CREATININE 0.90  --   --   --   --   --   --   TROPONINI  --   --   --  <0.03  --   --   --     Estimated Creatinine Clearance: 52.4 mL/min (by C-G formula based on SCr of 0.9 mg/dL).   Assessment: Heparin while Xarelto on hold, anti-Xa remains low after dose increase, no issues per RN.   Goal of Therapy:  Heparin level 0.3-0.7 units/ml Monitor platelets by anticoagulation protocol: Yes   Plan:  -Inc heparin to 1300 units/hr -0800 HL  Kashon Kraynak 11/30/2015,11:51 PM

## 2015-11-30 NOTE — Progress Notes (Signed)
ANTICOAGULATION CONSULT NOTE - FOLLOW UP    HL = 0.16 (goal 0.3 - 0.7 units/mL) Heparin dosing weight = 59 kg   Assessment: 62 YOF with history of GERD and PUD presented with complaint of stomach discomfort.  CT shows pancreatic adenocarcinoma.  Pharmacy consulted to manage IV heparin for history of Afib while Xarelto is on hold.  Heparin level is sub-therapeutic post resumption.  No bleeding nor issues reported.   Plan: - Increase heparin gtt to 1150 units/hr - Check 6 hr heparin level    Lindsay Salazar, PharmD, BCPS 11/30/2015, 4:48 PM

## 2015-11-30 NOTE — Progress Notes (Addendum)
Daily Rounding Note  11/30/2015, 11:52 AM  LOS: 3 days   SUBJECTIVE:   Chief complaint: Abdominal pain. This is improved.    She is eager to get home when all of her medical issues are sorted out.  OBJECTIVE:         Vital signs in last 24 hours:    Temp:  [98 F (36.7 C)-98.4 F (36.9 C)] 98.4 F (36.9 C) (10/27 0457) Pulse Rate:  [74-157] 98 (10/27 0457) Resp:  [16-35] 20 (10/27 0457) BP: (113-164)/(50-96) 138/86 (10/27 0457) SpO2:  [91 %-95 %] 94 % (10/27 0457) Last BM Date: 11/27/15 Filed Weights   11/27/15 1130  Weight: 59.4 kg (131 lb)   General: Thin, somewhat chronically ill and cachectic looking.   I did not physically reexamine the patient. She did not appear to be in any respiratory distress.  Intake/Output from previous day: 10/26 0701 - 10/27 0700 In: 620 [P.O.:220; I.V.:400] Out: 1000 [Urine:1000]  Intake/Output this shift: No intake/output data recorded.  Lab Results:  Recent Labs  11/28/15 0505 11/29/15 0413 11/30/15 0639  WBC 17.8* 15.8* 19.1*  HGB 11.3* 11.1* 11.1*  HCT 36.3 35.8* 35.6*  PLT 271 287 299   BMET  Recent Labs  11/27/15 1154 11/28/15 0505  NA 138 135  K 3.3* 3.7  CL 104 103  CO2 26 23  GLUCOSE 128* 110*  BUN 22* 13  CREATININE 1.06* 0.90  CALCIUM 8.7* 8.0*   LFT  Recent Labs  11/27/15 1154  PROT 6.9  ALBUMIN 3.4*  AST 19  ALT 26  ALKPHOS 92  BILITOT 0.5   PT/INR No results for input(s): LABPROT, INR in the last 72 hours. Hepatitis Panel No results for input(s): HEPBSAG, HCVAB, HEPAIGM, HEPBIGM in the last 72 hours.  Studies/Results: Mr Abdomen W Wo Contrast  Result Date: 11/28/2015 CLINICAL DATA:  65 year old female inpatient with abdominal pain and newly diagnosed pancreatic body mass on a CT study from 1 day prior. EXAM: MRI ABDOMEN WITHOUT AND WITH CONTRAST TECHNIQUE: Multiplanar multisequence MR imaging of the abdomen was performed both  before and after the administration of intravenous contrast. CONTRAST:  73mL MULTIHANCE GADOBENATE DIMEGLUMINE 529 MG/ML IV SOLN COMPARISON:  11/27/2015 CT abdomen/pelvis. FINDINGS: Many of the sequences are motion degraded. Lower chest: Faint tiny clustered nodular opacities in the anterior left lower lobe (series 1404/ image 45), correlating with mild tree-in-bud opacities on the CT study from 1 day prior. Hepatobiliary: Normal liver size and configuration. Mild diffuse hepatic steatosis. There is a simple 6.7 x 6.5 cm posterior left liver lobe cyst. There is a simple 1.1 cm segment 6 right liver lobe cyst. There are innumerable (> than 30) subcentimeter T2 hyperintense nonenhancing lesions scattered throughout the liver most consistent with biliary hamartomas. No suspicious liver masses. Normal gallbladder with no cholelithiasis. No biliary ductal dilatation. Common bile duct diameter 5 mm. No choledocholithiasis. Pancreas: There is an approximately 5.0 x 2.3 x 2.6 cm distal pancreatic body mass (series 8/ image 20), which demonstrates ill-defined infiltrative appearing margins and heterogeneous hypoenhancement, most consistent with a primary pancreatic adenocarcinoma. There is marked dilatation of the distal main pancreatic duct  within the atrophic pancreatic tail (9 mm diameter). This pancreatic mass appears to encase the splenic artery, which remains patent. This pancreatic mass appears to abut the anterior aspect of the splenic vein, which is mildly narrowed and remains patent. This pancreatic mass appears to involve a small portion of the main portal vein wall at the portosplenic confluence, approximately 20-30% of the main portal vein circumference. Main portal vein remains normal in caliber and patent. Superior mesenteric artery appears uninvolved. No pancreas divisum. Spleen: Normal size. No mass. Adrenals/Urinary Tract: Normal adrenals. No hydronephrosis. Simple 1.7 cm renal cyst in the posterior lower  right kidney. Small simple parapelvic renal cysts in the right kidney. No suspicious renal masses. Stomach/Bowel: Grossly normal stomach. Visualized small and large bowel is normal caliber, with no bowel wall thickening. Vascular/Lymphatic: Atherosclerotic nonaneurysmal abdominal aorta. Patent hepatic, left and right portal and renal veins. No pathologically enlarged lymph nodes in the abdomen. Other: No abdominal ascites or focal fluid collection. Musculoskeletal: No aggressive appearing focal osseous lesions. IMPRESSION: 1. Infiltrative hypoenhancing 5.0 x 2.3 x 2.6 cm distal pancreatic body mass, most consistent with a primary pancreatic adenocarcinoma. Atrophy and prominent pancreatic duct dilation in the pancreatic tail. Recommend correlation with endoscopic ultrasound with fine-needle aspiration. 2. Vascular involvement by the pancreatic mass as follows: Encasement of the patent splenic artery. Mild narrowing and abutment of the splenic vein, which remains patent. Mild involvement of the main portal vein wall at the portosplenic confluence. Portal vein remains patent and normal in caliber. 3. No evidence of metastatic disease in the abdomen. 4. Faint tiny clustered nodular opacities in the anterior left lower lobe, correlating with mild tree-in-bud opacities on the CT study from 1 day prior, indicating a mild infectious or inflammatory bronchiolitis, with the differential including mild aspiration. 5. Additional findings include aortic atherosclerosis, mild diffuse hepatic steatosis and benign liver cysts and hamartomas. Electronically Signed   By: Ilona Sorrel M.D.   On: 11/28/2015 13:25   Dg Chest Port 1 View  Result Date: 11/29/2015 CLINICAL DATA:  Shortness of breath. EXAM: PORTABLE CHEST 1 VIEW COMPARISON:  08/08/2015. FINDINGS: Mediastinum and hilar structures are normal. Heart size normal. Mild bibasilar atelectasis and/or infiltrates. Biapical pleural-parenchymal thickening noted consistent with  scarring. No pleural effusion or pneumothorax. IMPRESSION: 1. Mild right base subsegmental atelectasis. Mild right base infiltrate cannot be excluded. 2.  Biapical pleural-parenchymal scarring . Electronically Signed   By: Marcello Moores  Register   On: 11/29/2015 07:57   . benzonatate  100 mg Oral TID  . diltiazem  60 mg Oral Q6H  . doxycycline  100 mg Oral Q12H  . escitalopram  10 mg Oral QPM  . ipratropium-albuterol  3 mL Inhalation TID  . metoprolol tartrate  37.5 mg Oral BID  . mometasone-formoterol  2 puff Inhalation BID  . nicotine  14 mg Transdermal Daily     ASSESMENT:   *  Pancreatic mass.  EUS, FNA 10/26: obstructin 4.2 cm mass in pacreatic body. Obstructed/dilated PD.  Mass involves portosplenic vein confluence.  No SMA, cleiac trunk involvement.  FNA + for adenocarcinoma.   *  Pyloric ulcer on EGD 08/2015: no ulcers seen on EGD portion of 10/16 endoscopy.  Although the EGD was negative, given that she had a history of GI bleed and she will probably be discharged on anticoagulation therapy she should stay at least on an H2 blocker, if not a PPI daily .    PLAN   *  GI will sign off.  If  she needs future GI involvement, Dr. Laural Golden has seen her in the past and he is local to her in refill so may want to contact him rather than sending her all the way to [facility] for future GI issues.  *  It appears that patient will need oncology consult. This might be best done as an outpatient with an oncologist up in [place] because transportation is very hard for this patient. I would suggest palliative care as well but wonder if patient should see palliative care after she speaks with oncology.    Azucena Freed  11/30/2015, 11:52 AM Pager: [phone]   Scheduled Meds: . benzonatate  100 mg Oral TID  . diltiazem  60 mg Oral Q6H  . doxycycline  100 mg Oral Q12H  . escitalopram  10 mg Oral QPM  . ipratropium-albuterol  3 mL Inhalation TID  . metoprolol tartrate  37.5 mg Oral BID  .  mometasone-formoterol  2 puff Inhalation BID  . nicotine  14 mg Transdermal Daily   Continuous Infusions: . heparin 1,000 Units/hr (11/30/15 1012)   PRN Meds:.acetaminophen **OR** acetaminophen, albuterol, guaiFENesin-codeine, HYDROcodone-acetaminophen, metoprolol, metoprolol, morphine injection, ondansetron **OR** ondansetron (ZOFRAN) IV  EUS results discussed with Dr Ardis Hughs. Patient can go home anytime from a GI perspective.  I imagine she will be presented at upcoming tumor board.signing off - call as need arises.  Total time 15 minutes, all of which spent in chart review , counseling and care coordination

## 2015-11-30 NOTE — Progress Notes (Signed)
DAILY PROGRESS NOTE  Subjective:  A-fib with RVR noted overnight- seen by Dr. Harl Bowie yesterday, thought to have atypical chest pain. No hesitation to continue work-up and treatment for pancreatic mass from a cardiac standpoint once a-fib is rate-controlled.  Objective:  Temp:  [98 F (36.7 C)-98.4 F (36.9 C)] 98.4 F (36.9 C) (10/27 0457) Pulse Rate:  [74-157] 98 (10/27 0457) Resp:  [16-35] 20 (10/27 0457) BP: (113-164)/(50-96) 138/86 (10/27 0457) SpO2:  [91 %-100 %] 94 % (10/27 0457) Weight change:   Intake/Output from previous day: 10/26 0701 - 10/27 0700 In: 620 [P.O.:220; I.V.:400] Out: 1000 [Urine:1000]  Intake/Output from this shift: No intake/output data recorded.  Medications: No current facility-administered medications on file prior to encounter.    Current Outpatient Prescriptions on File Prior to Encounter  Medication Sig Dispense Refill  . albuterol (PROVENTIL) (2.5 MG/3ML) 0.083% nebulizer solution Take 3 mLs (2.5 mg total) by nebulization every 6 (six) hours as needed for wheezing or shortness of breath. 75 mL 12  . diltiazem (CARDIZEM CD) 120 MG 24 hr capsule Take 1 capsule (120 mg total) by mouth 2 (two) times daily. 60 capsule 6  . HYDROcodone-acetaminophen (NORCO/VICODIN) 5-325 MG tablet Take 1 tablet by mouth every 6 (six) hours as needed for moderate pain (Must last 30 days.Do not take and drive a car or use machinery.). 100 tablet 0  . ipratropium-albuterol (DUONEB) 0.5-2.5 (3) MG/3ML SOLN Inhale 3 mLs into the lungs every 6 (six) hours as needed (for shortness of breath).     . metoprolol tartrate (LOPRESSOR) 25 MG tablet Take 0.5 tablets (12.5 mg total) by mouth 2 (two) times daily. 90 tablet 3  . rivaroxaban (XARELTO) 20 MG TABS tablet Take 1 tablet (20 mg total) by mouth daily with supper. 30 tablet 10  . COMBIVENT RESPIMAT 20-100 MCG/ACT AERS respimat Inhale 1 puff into the lungs 2 (two) times daily.    Marland Kitchen escitalopram (LEXAPRO) 10 MG tablet  Take 10 mg by mouth every evening.     . furosemide (LASIX) 20 MG tablet Take 1 tablet (20 mg total) by mouth daily as needed for edema. (Patient taking differently: Take 20 mg by mouth daily as needed for fluid or edema. ) 90 tablet 3  . pantoprazole (PROTONIX) 40 MG tablet Take 1 tablet (40 mg total) by mouth daily. 60 tablet 0  . SYMBICORT 160-4.5 MCG/ACT inhaler Inhale 1 puff into the lungs 2 (two) times daily.      Physical Exam: General appearance: alert, appears older than stated age, no distress and using nebulizer, green sputum noted at the bedside in a cup Lungs: diminished breath sounds bilaterally, rhonchi bilaterally and wheezes bilaterally Heart: irregularly irregular rhythm Extremities: extremities normal, atraumatic, no cyanosis or edema Neurologic: Grossly normal  Lab Results: Results for orders placed or performed during the hospital encounter of 11/27/15 (from the past 48 hour(s))  APTT     Status: Abnormal   Collection Time: 11/28/15  9:18 PM  Result Value Ref Range   aPTT 67 (H) 24 - 36 seconds    Comment:        IF BASELINE aPTT IS ELEVATED, SUGGEST PATIENT RISK ASSESSMENT BE USED TO DETERMINE APPROPRIATE ANTICOAGULANT THERAPY.   CBC     Status: Abnormal   Collection Time: 11/29/15  4:13 AM  Result Value Ref Range   WBC 15.8 (H) 4.0 - 10.5 K/uL   RBC 4.56 3.87 - 5.11 MIL/uL   Hemoglobin 11.1 (L) 12.0 - 15.0 g/dL  HCT 35.8 (L) 36.0 - 46.0 %   MCV 78.5 78.0 - 100.0 fL   MCH 24.3 (L) 26.0 - 34.0 pg   MCHC 31.0 30.0 - 36.0 g/dL   RDW 18.3 (H) 11.5 - 15.5 %   Platelets 287 150 - 400 K/uL  Cancer antigen 19-9     Status: Abnormal   Collection Time: 11/29/15  4:13 AM  Result Value Ref Range   CA 19-9 194 (H) 0 - 35 U/mL    Comment: (NOTE) Roche ECLIA methodology Performed At: [facility] [address] [place], [place] [id number] Lindon Romp MD [id number]   APTT     Status: Abnormal   Collection Time: 11/29/15  4:13 AM  Result Value  Ref Range   aPTT 55 (H) 24 - 36 seconds    Comment:        IF BASELINE aPTT IS ELEVATED, SUGGEST PATIENT RISK ASSESSMENT BE USED TO DETERMINE APPROPRIATE ANTICOAGULANT THERAPY.   Heparin level (unfractionated)     Status: None   Collection Time: 11/29/15  4:13 AM  Result Value Ref Range   Heparin Unfractionated 0.31 0.30 - 0.70 IU/mL    Comment:        IF HEPARIN RESULTS ARE BELOW EXPECTED VALUES, AND PATIENT DOSAGE HAS BEEN CONFIRMED, SUGGEST FOLLOW UP TESTING OF ANTITHROMBIN III LEVELS.   Troponin I     Status: None   Collection Time: 11/29/15  7:47 AM  Result Value Ref Range   Troponin I <0.03 <0.03 ng/mL  D-dimer, quantitative (not at [facility])     Status: None   Collection Time: 11/29/15  7:47 AM  Result Value Ref Range   D-Dimer, Quant 0.32 0.00 - 0.50 ug/mL-FEU    Comment: (NOTE) At the manufacturer cut-off of 0.50 ug/mL FEU, this assay has been documented to exclude PE with a sensitivity and negative predictive value of 97 to 99%.  At this time, this assay has not been approved by the FDA to exclude DVT/VTE. Results should be correlated with clinical presentation.   CBC     Status: Abnormal   Collection Time: 11/30/15  6:39 AM  Result Value Ref Range   WBC 19.1 (H) 4.0 - 10.5 K/uL   RBC 4.46 3.87 - 5.11 MIL/uL   Hemoglobin 11.1 (L) 12.0 - 15.0 g/dL   HCT 35.6 (L) 36.0 - 46.0 %   MCV 79.8 78.0 - 100.0 fL   MCH 24.9 (L) 26.0 - 34.0 pg   MCHC 31.2 30.0 - 36.0 g/dL   RDW 18.4 (H) 11.5 - 15.5 %   Platelets 299 150 - 400 K/uL    Imaging: Mr Abdomen W Wo Contrast  Result Date: 11/28/2015 CLINICAL DATA:  65 year old female inpatient with abdominal pain and newly diagnosed pancreatic body mass on a CT study from 1 day prior. EXAM: MRI ABDOMEN WITHOUT AND WITH CONTRAST TECHNIQUE: Multiplanar multisequence MR imaging of the abdomen was performed both before and after the administration of intravenous contrast. CONTRAST:  38mL MULTIHANCE GADOBENATE DIMEGLUMINE 529 MG/ML IV  SOLN COMPARISON:  11/27/2015 CT abdomen/pelvis. FINDINGS: Many of the sequences are motion degraded. Lower chest: Faint tiny clustered nodular opacities in the anterior left lower lobe (series 1404/ image 45), correlating with mild tree-in-bud opacities on the CT study from 1 day prior. Hepatobiliary: Normal liver size and configuration. Mild diffuse hepatic steatosis. There is a simple 6.7 x 6.5 cm posterior left liver lobe cyst. There is a simple 1.1 cm segment 6 right liver lobe cyst. There  are innumerable (> than 30) subcentimeter T2 hyperintense nonenhancing lesions scattered throughout the liver most consistent with biliary hamartomas. No suspicious liver masses. Normal gallbladder with no cholelithiasis. No biliary ductal dilatation. Common bile duct diameter 5 mm. No choledocholithiasis. Pancreas: There is an approximately 5.0 x 2.3 x 2.6 cm distal pancreatic body mass (series 8/ image 20), which demonstrates ill-defined infiltrative appearing margins and heterogeneous hypoenhancement, most consistent with a primary pancreatic adenocarcinoma. There is marked dilatation of the distal main pancreatic duct within the atrophic pancreatic tail (9 mm diameter). This pancreatic mass appears to encase the splenic artery, which remains patent. This pancreatic mass appears to abut the anterior aspect of the splenic vein, which is mildly narrowed and remains patent. This pancreatic mass appears to involve a small portion of the main portal vein wall at the portosplenic confluence, approximately 20-30% of the main portal vein circumference. Main portal vein remains normal in caliber and patent. Superior mesenteric artery appears uninvolved. No pancreas divisum. Spleen: Normal size. No mass. Adrenals/Urinary Tract: Normal adrenals. No hydronephrosis. Simple 1.7 cm renal cyst in the posterior lower right kidney. Small simple parapelvic renal cysts in the right kidney. No suspicious renal masses. Stomach/Bowel: Grossly  normal stomach. Visualized small and large bowel is normal caliber, with no bowel wall thickening. Vascular/Lymphatic: Atherosclerotic nonaneurysmal abdominal aorta. Patent hepatic, left and right portal and renal veins. No pathologically enlarged lymph nodes in the abdomen. Other: No abdominal ascites or focal fluid collection. Musculoskeletal: No aggressive appearing focal osseous lesions. IMPRESSION: 1. Infiltrative hypoenhancing 5.0 x 2.3 x 2.6 cm distal pancreatic body mass, most consistent with a primary pancreatic adenocarcinoma. Atrophy and prominent pancreatic duct dilation in the pancreatic tail. Recommend correlation with endoscopic ultrasound with fine-needle aspiration. 2. Vascular involvement by the pancreatic mass as follows: Encasement of the patent splenic artery. Mild narrowing and abutment of the splenic vein, which remains patent. Mild involvement of the main portal vein wall at the portosplenic confluence. Portal vein remains patent and normal in caliber. 3. No evidence of metastatic disease in the abdomen. 4. Faint tiny clustered nodular opacities in the anterior left lower lobe, correlating with mild tree-in-bud opacities on the CT study from 1 day prior, indicating a mild infectious or inflammatory bronchiolitis, with the differential including mild aspiration. 5. Additional findings include aortic atherosclerosis, mild diffuse hepatic steatosis and benign liver cysts and hamartomas. Electronically Signed   By: Ilona Sorrel M.D.   On: 11/28/2015 13:25   Dg Chest Port 1 View  Result Date: 11/29/2015 CLINICAL DATA:  Shortness of breath. EXAM: PORTABLE CHEST 1 VIEW COMPARISON:  08/08/2015. FINDINGS: Mediastinum and hilar structures are normal. Heart size normal. Mild bibasilar atelectasis and/or infiltrates. Biapical pleural-parenchymal thickening noted consistent with scarring. No pleural effusion or pneumothorax. IMPRESSION: 1. Mild right base subsegmental atelectasis. Mild right base  infiltrate cannot be excluded. 2.  Biapical pleural-parenchymal scarring . Electronically Signed   By: Marcello Moores  Register   On: 11/29/2015 07:57    Assessment:  1. Principal Problem: 2.   Pancreatic mass 3. Active Problems: 4.   Tobacco abuse 5.   Atrial fibrillation ([place]) 6.   Chronic obstructive pulmonary disease (COPD) ([place]) 7.   Chronic anticoagulation-Xarelto 8.   Hypokalemia 9.   Leukocytosis 10.   Plan:  1. HR control better overnight with regards to a-fib - on diltiazem 60 mg Q6hrs and metoprolol tartrate 25 mg BID. Increase metoprolol to 37.5 mg BID today for better rate control.  Time Spent Directly with Patient:  15 minutes  Length of Stay:  LOS: 3 days   Pixie Casino, MD, [facility] Attending Cardiologist [place] 11/30/2015, 8:31 AM

## 2015-11-30 NOTE — Consult Note (Signed)
[facility] Surgery Consult Note  Lindsay Salazar [facility] [birth year]  [id number].    Requesting MD: Dr. Tana Coast Chief Complaint/Reason for Consult: pancreatic mass  HPI:  65 year-old Salazar with a PMH of pyloric ulcer, afib requiring DCCV 07/2015, HTN, COPD and tobacco abuse transferred to [facility] on 11/27/15. Her chief complaint was epigastric pain that started after eating dinner, which she attributed to a possible ulcer since she had a known pyloric ulcer in July 2017 requiring admission to the hospital. Associated with mild nausea. Denies fever, chills, CP, palpitations, diarrhea, constipation, melena, hematochezia, or urinary symptoms. Smokes 1 PPD cigarettes. Wears home O2 in the summer. On disability due to her COPD.  ED workup significant for CT scan revealing a pancreatic body mass with local progression to regional blood vessels, WBC 24.5, and tachycardia.  She was transferred to [facility]. MRI  Abdomen showed infiltrative hypoenhancing 5.0 x 2.3 x 2.6 cm distal pancreatic body mass, most consistent with a primary pancreatic adenocarcinoma. EUS/FNA performed by Dr. Ardis Hughs 10/26 and preliminary cytology positive for malignancy. General surgery has been asked to see the patient in consult.   ROS: All systems reviewed and otherwise negative except for as above  Family History  Problem Relation Age of Onset  . COPD Mother   . Diabetes Father   . Stroke Father     Past Medical History:  Diagnosis Date  . Anginal pain ([place])    thought she was having   heartburn  . Arthritis    bursitis , fibromyalgia  . Arthritis    "right hip" (11/27/2015)  . Asthma   . Atrial fibrillation ([place])   . Chronic bronchitis ([place])   . Chronic right hip pain   . COPD (chronic obstructive pulmonary disease) ([place])    wears home o2 prn  . Daily headache    "last 2-3 months" (11/27/2015)  . Exposure to TB    "mom had it when she was pregnant with me"  . Fibromyalgia   . GERD  (gastroesophageal reflux disease)    use to have it but not now  . Heart murmur    37-  46 years old  . Hypertension   . On home oxygen therapy    11/27/2015 "whenever I need it; don't know how many liters"  . Pneumonia    "several times" (11/27/2015)  . Pneumothorax     Past Surgical History:  Procedure Laterality Date  . ANTERIOR CERVICAL DECOMP/DISCECTOMY FUSION     "put 4 screws in"  . BACK SURGERY    . CARDIAC CATHETERIZATION  08/2004   Archie Endo 06/18/2010  . CARDIOVERSION N/A 07/18/2015   Procedure: CARDIOVERSION;  Surgeon: Josue Hector, MD;  Location: [facility];  Service: Cardiovascular;  Laterality: N/A;  . ESOPHAGOGASTRODUODENOSCOPY N/A 08/09/2015   Procedure: ESOPHAGOGASTRODUODENOSCOPY (EGD);  Surgeon: Rogene Houston, MD;  Location: [facility] ENDO SUITE;  Service: Endoscopy;  Laterality: N/A;  . EUS N/A 11/29/2015   Procedure: UPPER ENDOSCOPIC ULTRASOUND (EUS) RADIAL;  Surgeon: Milus Banister, MD;  Location: [facility] ENDOSCOPY;  Service: Endoscopy;  Laterality: N/A;  . TONSILLECTOMY    . TUBAL LIGATION      Social History:  reports that she has been smoking Cigarettes.  She started smoking about 46 years ago. She has a 23.00 pack-year smoking history. She has never used smokeless tobacco. She reports that she does not drink alcohol or use drugs.  Allergies:  Allergies  Allergen Reactions  . Ibuprofen Nausea And  Vomiting  . Penicillins Swelling    Has patient had a PCN reaction causing immediate rash, facial/tongue/throat swelling, SOB or lightheadedness with hypotension: Yes Has patient had a PCN reaction causing severe rash involving mucus membranes or skin necrosis: No Has patient had a PCN reaction that required hospitalization No Has patient had a PCN reaction occurring within the last 10 years: No If all of the above answers are "NO", then may proceed with Cephalosporin use.   . Tiotropium Bromide Monohydrate Itching  . Tramadol Nausea And Vomiting  . Vicodin  [Hydrocodone-Acetaminophen] Nausea And Vomiting    Medications Prior to Admission  Medication Sig Dispense Refill  . albuterol (PROVENTIL) (2.5 MG/3ML) 0.083% nebulizer solution Take 3 mLs (2.5 mg total) by nebulization every 6 (six) hours as needed for wheezing or shortness of breath. 75 mL 12  . diltiazem (CARDIZEM CD) 120 MG 24 hr capsule Take 1 capsule (120 mg total) by mouth 2 (two) times daily. 60 capsule 6  . HYDROcodone-acetaminophen (NORCO/VICODIN) 5-325 MG tablet Take 1 tablet by mouth every 6 (six) hours as needed for moderate pain (Must last 30 days.Do not take and drive a car or use machinery.). 100 tablet 0  . ipratropium-albuterol (DUONEB) 0.5-2.5 (3) MG/3ML SOLN Inhale 3 mLs into the lungs every 6 (six) hours as needed (for shortness of breath).     . metoprolol tartrate (LOPRESSOR) 25 MG tablet Take 0.5 tablets (12.5 mg total) by mouth 2 (two) times daily. 90 tablet 3  . rivaroxaban (XARELTO) 20 MG TABS tablet Take 1 tablet (20 mg total) by mouth daily with supper. 30 tablet 10  . COMBIVENT RESPIMAT 20-100 MCG/ACT AERS respimat Inhale 1 puff into the lungs 2 (two) times daily.    Marland Kitchen escitalopram (LEXAPRO) 10 MG tablet Take 10 mg by mouth every evening.     . furosemide (LASIX) 20 MG tablet Take 1 tablet (20 mg total) by mouth daily as needed for edema. (Patient taking differently: Take 20 mg by mouth daily as needed for fluid or edema. ) 90 tablet 3  . pantoprazole (PROTONIX) 40 MG tablet Take 1 tablet (40 mg total) by mouth daily. 60 tablet 0  . SYMBICORT 160-4.5 MCG/ACT inhaler Inhale 1 puff into the lungs 2 (two) times daily.      Blood pressure 138/86, pulse 98, temperature 98.4 F (36.9 C), temperature source Oral, resp. rate 20, height 5\' 4"  (1.626 m), weight 131 lb (59.4 kg), SpO2 94 %. Physical Exam: General: pleasant, white female who appears older than her stated age 65: head is normocephalic, atraumatic. Nasal cannula in place. Heart: irregularly irregular.   Palpable pedal pulses bilaterally Lungs: Respiratory effort nonlabored. Scattered wheezes bilaterally Abd: soft, NT/ND, +BS, no masses, hernias, or organomegaly MS: all 4 extremities are symmetrical with no cyanosis, clubbing, or edema. Skin: warm and dry with no masses, lesions, or rashes Psych: anxious but appropriate affect. Neuro: A&Ox3, moves all extremities spontaneously, normal speech  Results for orders placed or performed during the hospital encounter of 11/27/15 (from the past 48 hour(s))  APTT     Status: Abnormal   Collection Time: 11/28/15  9:18 PM  Result Value Ref Range   aPTT 67 (H) 24 - 36 seconds    Comment:        IF BASELINE aPTT IS ELEVATED, SUGGEST PATIENT RISK ASSESSMENT BE USED TO DETERMINE APPROPRIATE ANTICOAGULANT THERAPY.   CBC     Status: Abnormal   Collection Time: 11/29/15  4:13 AM  Result Value Ref  Range   WBC 15.8 (H) 4.0 - 10.5 K/uL   RBC 4.56 3.87 - 5.11 MIL/uL   Hemoglobin 11.1 (L) 12.0 - 15.0 g/dL   HCT 35.8 (L) 36.0 - 46.0 %   MCV 78.5 78.0 - 100.0 fL   MCH 24.3 (L) 26.0 - 34.0 pg   MCHC 31.0 30.0 - 36.0 g/dL   RDW 18.3 (H) 11.5 - 15.5 %   Platelets 287 150 - 400 K/uL  Cancer antigen 19-9     Status: Abnormal   Collection Time: 11/29/15  4:13 AM  Result Value Ref Range   CA 19-9 194 (H) 0 - 35 U/mL    Comment: (NOTE) Roche ECLIA methodology Performed At: [facility] [place], [place] [id number] Lindon Romp MD [id number]   APTT     Status: Abnormal   Collection Time: 11/29/15  4:13 AM  Result Value Ref Range   aPTT 55 (H) 24 - 36 seconds    Comment:        IF BASELINE aPTT IS ELEVATED, SUGGEST PATIENT RISK ASSESSMENT BE USED TO DETERMINE APPROPRIATE ANTICOAGULANT THERAPY.   Heparin level (unfractionated)     Status: None   Collection Time: 11/29/15  4:13 AM  Result Value Ref Range   Heparin Unfractionated 0.31 0.30 - 0.70 IU/mL    Comment:        IF HEPARIN RESULTS ARE BELOW EXPECTED VALUES,  AND PATIENT DOSAGE HAS BEEN CONFIRMED, SUGGEST FOLLOW UP TESTING OF ANTITHROMBIN III LEVELS.   Troponin I     Status: None   Collection Time: 11/29/15  7:47 AM  Result Value Ref Range   Troponin I <0.03 <0.03 ng/mL  D-dimer, quantitative (not at [facility])     Status: None   Collection Time: 11/29/15  7:47 AM  Result Value Ref Range   D-Dimer, Quant 0.32 0.00 - 0.50 ug/mL-FEU    Comment: (NOTE) At the manufacturer cut-off of 0.50 ug/mL FEU, this assay has been documented to exclude PE with a sensitivity and negative predictive value of 97 to 99%.  At this time, this assay has not been approved by the FDA to exclude DVT/VTE. Results should be correlated with clinical presentation.   CBC     Status: Abnormal   Collection Time: 11/30/15  6:39 AM  Result Value Ref Range   WBC 19.1 (H) 4.0 - 10.5 K/uL   RBC 4.46 3.87 - 5.11 MIL/uL   Hemoglobin 11.1 (L) 12.0 - 15.0 g/dL   HCT 35.6 (L) 36.0 - 46.0 %   MCV 79.8 78.0 - 100.0 fL   MCH 24.9 (L) 26.0 - 34.0 pg   MCHC 31.2 30.0 - 36.0 g/dL   RDW 18.4 (H) 11.5 - 15.5 %   Platelets 299 150 - 400 K/uL   Mr Abdomen W Wo Contrast  Result Date: 11/28/2015 CLINICAL DATA:  65 year old female inpatient with abdominal pain and newly diagnosed pancreatic body mass on a CT study from 1 day prior. EXAM: MRI ABDOMEN WITHOUT AND WITH CONTRAST TECHNIQUE: Multiplanar multisequence MR imaging of the abdomen was performed both before and after the administration of intravenous contrast. CONTRAST:  72mL MULTIHANCE GADOBENATE DIMEGLUMINE 529 MG/ML IV SOLN COMPARISON:  11/27/2015 CT abdomen/pelvis. FINDINGS: Many of the sequences are motion degraded. Lower chest: Faint tiny clustered nodular opacities in the anterior left lower lobe (series 1404/ image 45), correlating with mild tree-in-bud opacities on the CT study from 1 day prior. Hepatobiliary: Normal liver size and configuration. Mild diffuse hepatic steatosis.  There is a simple 6.7 x 6.5 cm posterior left liver  lobe cyst. There is a simple 1.1 cm segment 6 right liver lobe cyst. There are innumerable (> than 30) subcentimeter T2 hyperintense nonenhancing lesions scattered throughout the liver most consistent with biliary hamartomas. No suspicious liver masses. Normal gallbladder with no cholelithiasis. No biliary ductal dilatation. Common bile duct diameter 5 mm. No choledocholithiasis. Pancreas: There is an approximately 5.0 x 2.3 x 2.6 cm distal pancreatic body mass (series 8/ image 20), which demonstrates ill-defined infiltrative appearing margins and heterogeneous hypoenhancement, most consistent with a primary pancreatic adenocarcinoma. There is marked dilatation of the distal main pancreatic duct within the atrophic pancreatic tail (9 mm diameter). This pancreatic mass appears to encase the splenic artery, which remains patent. This pancreatic mass appears to abut the anterior aspect of the splenic vein, which is mildly narrowed and remains patent. This pancreatic mass appears to involve a small portion of the main portal vein wall at the portosplenic confluence, approximately 20-30% of the main portal vein circumference. Main portal vein remains normal in caliber and patent. Superior mesenteric artery appears uninvolved. No pancreas divisum. Spleen: Normal size. No mass. Adrenals/Urinary Tract: Normal adrenals. No hydronephrosis. Simple 1.7 cm renal cyst in the posterior lower right kidney. Small simple parapelvic renal cysts in the right kidney. No suspicious renal masses. Stomach/Bowel: Grossly normal stomach. Visualized small and large bowel is normal caliber, with no bowel wall thickening. Vascular/Lymphatic: Atherosclerotic nonaneurysmal abdominal aorta. Patent hepatic, left and right portal and renal veins. No pathologically enlarged lymph nodes in the abdomen. Other: No abdominal ascites or focal fluid collection. Musculoskeletal: No aggressive appearing focal osseous lesions. IMPRESSION: 1. Infiltrative  hypoenhancing 5.0 x 2.3 x 2.6 cm distal pancreatic body mass, most consistent with a primary pancreatic adenocarcinoma. Atrophy and prominent pancreatic duct dilation in the pancreatic tail. Recommend correlation with endoscopic ultrasound with fine-needle aspiration. 2. Vascular involvement by the pancreatic mass as follows: Encasement of the patent splenic artery. Mild narrowing and abutment of the splenic vein, which remains patent. Mild involvement of the main portal vein wall at the portosplenic confluence. Portal vein remains patent and normal in caliber. 3. No evidence of metastatic disease in the abdomen. 4. Faint tiny clustered nodular opacities in the anterior left lower lobe, correlating with mild tree-in-bud opacities on the CT study from 1 day prior, indicating a mild infectious or inflammatory bronchiolitis, with the differential including mild aspiration. 5. Additional findings include aortic atherosclerosis, mild diffuse hepatic steatosis and benign liver cysts and hamartomas. Electronically Signed   By: Ilona Sorrel M.D.   On: 11/28/2015 13:25   Dg Chest Port 1 View  Result Date: 11/29/2015 CLINICAL DATA:  Shortness of breath. EXAM: PORTABLE CHEST 1 VIEW COMPARISON:  08/08/2015. FINDINGS: Mediastinum and hilar structures are normal. Heart size normal. Mild bibasilar atelectasis and/or infiltrates. Biapical pleural-parenchymal thickening noted consistent with scarring. No pleural effusion or pneumothorax. IMPRESSION: 1. Mild right base subsegmental atelectasis. Mild right base infiltrate cannot be excluded. 2.  Biapical pleural-parenchymal scarring . Electronically Signed   By: Marcello Moores  Register   On: 11/29/2015 07:57   Assessment/Plan Mass of Pancreatic Body -  CT 10/24: 32 mm long, 25 mm thick hypoenhancing mass in the pancreatic body -  MRI 10/25: 5 x 2.3 x 2.6 cm mass with ill-defined infiltrative appearing margins and heterogeneous hypoenhancement, most consistent with a primary  pancreatic adenocarcinoma; mass encases splenic artery and abuts the anterior aspect of splenic vein; involves a small portion of  the main portal vein. All vessels patent at this time. SMA uninvolved/ -  10/26: Endoscopy with FNA; await final path results -  CA 19-9 is 194   COPD with acute bronchitis A.fib with RVR - cardizem and metoprolol; heparin  Tobacco abuse Limited home resources - Social work   Recommendations: Await final path results, suspect primary adenocarcinoma of the pancreas. Consult oncology for possible neoadjuvant chemotherapy. Unsure if complete surgical resection is possible given proximity of the tumor with splenic vessels.  Will present this patients case to the GI tumor board.   Jill Alexanders, [facility] Surgery 11/30/2015, 11:57 AM Pager: [phone] Consults: [phone] Mon-Fri 7:00 am-4:30 pm Sat-Sun 7:00 am-11:30 am

## 2015-11-30 NOTE — Consult Note (Signed)
[place] NOTE  Patient Care Team: [name], PA-C as PCP - General (Physician Assistant)  CHIEF COMPLAINTS/PURPOSE OF CONSULTATION:  Locally advanced pancreatic cancer, for further management  HISTORY OF PRESENTING ILLNESS:  Lindsay Salazar 65 y.o. female was transferred from [facility] on 11/27/2015 after significant sensation of gastritis/epigastric burning. This patient was noted to have history of GI bleed secondary to NSAID ingestion. She has significant smoking history. The patient have chronic atrial fibrillation and on antiplatelet agents in the background. When she presented to the emergency department, CT scan revealed a mass in the pancreatic body encasing regional blood vessels and she was noted to have leukocytosis, worrisome for early sepsis. She was transferred to [facility] to expedite workup. CT scan also revealed multiple liver cysts but MRI excluded metastatic disease to the liver. It is noted that a small portion of the portal vein is involved. She underwent upper endoscopy evaluation, EUS in ultrasound and preliminary biopsy confirmed adenocarcinoma. No regional adenopathy was found. The gastric ulcer has healed. She denies pain. She had intermittent sensation of palpitation from atrial fibrillation. The patient denies any recent signs or symptoms of bleeding such as spontaneous epistaxis, hematuria or hematochezia. She denies nausea, change in bowel habits or recent weight loss. She thought she may have skin itching recently.  MEDICAL HISTORY:  Past Medical History:  Diagnosis Date  . Anginal pain ([place])    thought she was having   heartburn  . Arthritis    bursitis , fibromyalgia  . Arthritis    "right hip" (11/27/2015)  . Asthma   . Atrial fibrillation ([place])   . Chronic bronchitis ([place])   . Chronic right hip pain   . COPD (chronic obstructive pulmonary disease) ([place])    wears home o2 prn  . Daily headache     "last 2-3 months" (11/27/2015)  . Exposure to TB    "mom had it when she was pregnant with me"  . Fibromyalgia   . GERD (gastroesophageal reflux disease)    use to have it but not now  . Heart murmur    51-  76 years old  . Hypertension   . On home oxygen therapy    11/27/2015 "whenever I need it; don't know how many liters"  . Pneumonia    "several times" (11/27/2015)  . Pneumothorax     SURGICAL HISTORY: Past Surgical History:  Procedure Laterality Date  . ANTERIOR CERVICAL DECOMP/DISCECTOMY FUSION     "put 4 screws in"  . BACK SURGERY    . CARDIAC CATHETERIZATION  08/2004   Archie Endo 06/18/2010  . CARDIOVERSION N/A 07/18/2015   Procedure: CARDIOVERSION;  Surgeon: Josue Hector, MD;  Location: [facility];  Service: Cardiovascular;  Laterality: N/A;  . ESOPHAGOGASTRODUODENOSCOPY N/A 08/09/2015   Procedure: ESOPHAGOGASTRODUODENOSCOPY (EGD);  Surgeon: Rogene Houston, MD;  Location: [facility];  Service: Endoscopy;  Laterality: N/A;  . EUS N/A 11/29/2015   Procedure: UPPER ENDOSCOPIC ULTRASOUND (EUS) RADIAL;  Surgeon: Milus Banister, MD;  Location: [facility] ENDOSCOPY;  Service: Endoscopy;  Laterality: N/A;  . TONSILLECTOMY    . TUBAL LIGATION      SOCIAL HISTORY: Social History   Social History  . Marital status: Divorced    Spouse name: N/A  . Number of children: N/A  . Years of education: N/A   Occupational History  . retired    Social History Main Topics  . Smoking status: Current Every Day Smoker  Packs/day: 0.50    Years: 46.00    Types: Cigarettes    Start date: 10/09/1969  . Smokeless tobacco: Never Used  . Alcohol use No     Comment: quit 30 years ago  . Drug use: No  . Sexual activity: Not Currently    Birth control/ protection: Surgical, Post-menopausal   Other Topics Concern  . Not on file   Social History Narrative  . No narrative on file    FAMILY HISTORY: Family History  Problem Relation Age of Onset  . COPD Mother   . Diabetes Father   .  Stroke Father     ALLERGIES:  is allergic to ibuprofen; penicillins; tiotropium bromide monohydrate; tramadol; and vicodin [hydrocodone-acetaminophen].  MEDICATIONS:  Current Facility-Administered Medications  Medication Dose Route Frequency Provider Last Rate Last Dose  . acetaminophen (TYLENOL) tablet 650 mg  650 mg Oral Q6H PRN Karmen Bongo, MD       Or  . acetaminophen (TYLENOL) suppository 650 mg  650 mg Rectal Q6H PRN Karmen Bongo, MD      . albuterol (PROVENTIL) (2.5 MG/3ML) 0.083% nebulizer solution 2.5 mg  2.5 mg Nebulization Q4H PRN Nishant Dhungel, MD      . benzonatate (TESSALON) capsule 100 mg  100 mg Oral TID Ripudeep Krystal Eaton, MD   100 mg at 11/30/15 0948  . diltiazem (CARDIZEM) tablet 60 mg  60 mg Oral Q6H Nishant Dhungel, MD   60 mg at 11/30/15 1337  . doxycycline (VIBRA-TABS) tablet 100 mg  100 mg Oral Q12H Ripudeep K Rai, MD   100 mg at 11/30/15 0948  . escitalopram (LEXAPRO) tablet 10 mg  10 mg Oral QPM Karmen Bongo, MD   10 mg at 11/29/15 1835  . guaiFENesin-codeine 100-10 MG/5ML solution 5 mL  5 mL Oral Q6H PRN Ripudeep K Rai, MD      . heparin ADULT infusion 100 units/mL (25000 units/240mL sodium chloride 0.45%)  1,000 Units/hr Intravenous Continuous Ripudeep K Rai, MD 10 mL/hr at 11/30/15 1012 1,000 Units/hr at 11/30/15 1012  . HYDROcodone-acetaminophen (NORCO/VICODIN) 5-325 MG per tablet 1 tablet  1 tablet Oral Q6H PRN Karmen Bongo, MD   1 tablet at 11/30/15 0650  . ipratropium-albuterol (DUONEB) 0.5-2.5 (3) MG/3ML nebulizer solution 3 mL  3 mL Inhalation TID Karmen Bongo, MD   3 mL at 11/30/15 1322  . metoprolol (LOPRESSOR) injection 2.5 mg  2.5 mg Intravenous Q5 min PRN Merton Border, MD   2.5 mg at 11/29/15 1938  . metoprolol (LOPRESSOR) injection 5 mg  5 mg Intravenous Q5 min PRN Arnoldo Lenis, MD      . metoprolol tartrate (LOPRESSOR) tablet 37.5 mg  37.5 mg Oral BID Pixie Casino, MD   37.5 mg at 11/30/15 0948  . mometasone-formoterol (DULERA) 200-5  MCG/ACT inhaler 2 puff  2 puff Inhalation BID Karmen Bongo, MD   2 puff at 11/30/15 1154  . morphine 2 MG/ML injection 2 mg  2 mg Intravenous Q2H PRN Karmen Bongo, MD   2 mg at 11/27/15 2143  . nicotine (NICODERM CQ - dosed in mg/24 hours) patch 14 mg  14 mg Transdermal Daily Karmen Bongo, MD   14 mg at 11/30/15 0951  . ondansetron (ZOFRAN) tablet 4 mg  4 mg Oral Q6H PRN Karmen Bongo, MD       Or  . ondansetron [facility]) injection 4 mg  4 mg Intravenous Q6H PRN Karmen Bongo, MD        REVIEW OF SYSTEMS:  Constitutional: Denies fevers, chills or abnormal night sweats Eyes: Denies blurriness of vision, double vision or watery eyes Ears, nose, mouth, throat, and face: Denies mucositis or sore throat Respiratory: Denies cough, dyspnea or wheezes Skin: Denies abnormal skin rashes Lymphatics: Denies new lymphadenopathy or easy bruising Neurological:Denies numbness, tingling or new weaknesses Behavioral/Psych: Mood is stable, no new changes  All other systems were reviewed with the patient and are negative.  PHYSICAL EXAMINATION: ECOG PERFORMANCE STATUS: 1 - Symptomatic but completely ambulatory  Vitals:   11/30/15 0457 11/30/15 1339  BP: 138/86 122/72  Pulse: 98 75  Resp: 20 16  Temp: 98.4 F (36.9 C) 99.2 F (37.3 C)   Filed Weights   11/27/15 1130  Weight: 131 lb (59.4 kg)    GENERAL:alert, no distress and comfortable. Poor personal hygiene is noted SKIN: skin color, texture, turgor are normal, no rashes or significant lesions EYES: normal, conjunctiva are pink and non-injected, sclera clear OROPHARYNX:no exudate, no erythema and lips, buccal mucosa, and tongue normal  NECK: supple, thyroid normal size, non-tender, without nodularity LYMPH:  no palpable lymphadenopathy in the cervical, axillary or inguinal LUNGS: clear to auscultation and percussion with normal breathing effort HEART: regular rate & rhythm and no murmurs and no lower extremity edema ABDOMEN:abdomen  soft, non-tender and normal bowel sounds Musculoskeletal:no cyanosis of digits and no clubbing  PSYCH: alert & oriented x 3 with fluent speech NEURO: no focal motor/sensory deficits  LABORATORY DATA:  I have reviewed the data as listed Lab Results  Component Value Date   WBC 19.1 (H) 11/30/2015   HGB 11.1 (L) 11/30/2015   HCT 35.6 (L) 11/30/2015   MCV 79.8 11/30/2015   PLT 299 11/30/2015    Recent Labs  07/18/15 0443 07/19/15 0514  08/09/15 0316 11/27/15 1154 11/28/15 0505  NA 133* 133*  < > 135 138 135  K 3.9 4.3  < > 3.8 3.3* 3.7  CL 104 103  < > 102 104 103  CO2 23 23  < > 25 26 23   GLUCOSE 156* 158*  < > 144* 128* 110*  BUN 36* 32*  < > 46* 22* 13  CREATININE 0.87 0.90  < > 1.04* 1.06* 0.90  CALCIUM 8.5* 8.4*  < > 7.5* 8.7* 8.0*  GFRNONAA >60 >60  < > 54* 53* >60  GFRAA >60 >60  < > >60 >60 >60  PROT 6.0* 5.5*  --   --  6.9  --   ALBUMIN 3.3* 3.0*  --   --  3.4*  --   AST 17 21  --   --  19  --   ALT 34 34  --   --  26  --   ALKPHOS 61 56  --   --  92  --   BILITOT 0.6 0.8  --   --  0.5  --   < > = values in this interval not displayed.  RADIOGRAPHIC STUDIES: I have personally reviewed the radiological images as listed and agreed with the findings in the report. Mr Abdomen W Wo Contrast  Result Date: 11/28/2015 CLINICAL DATA:  65 year old female inpatient with abdominal pain and newly diagnosed pancreatic body mass on a CT study from 1 day prior. EXAM: MRI ABDOMEN WITHOUT AND WITH CONTRAST TECHNIQUE: Multiplanar multisequence MR imaging of the abdomen was performed both before and after the administration of intravenous contrast. CONTRAST:  70mL MULTIHANCE GADOBENATE DIMEGLUMINE 529 MG/ML IV SOLN COMPARISON:  11/27/2015 CT abdomen/pelvis. FINDINGS: Many of the sequences  are motion degraded. Lower chest: Faint tiny clustered nodular opacities in the anterior left lower lobe (series 1404/ image 45), correlating with mild tree-in-bud opacities on the CT study from 1 day  prior. Hepatobiliary: Normal liver size and configuration. Mild diffuse hepatic steatosis. There is a simple 6.7 x 6.5 cm posterior left liver lobe cyst. There is a simple 1.1 cm segment 6 right liver lobe cyst. There are innumerable (> than 30) subcentimeter T2 hyperintense nonenhancing lesions scattered throughout the liver most consistent with biliary hamartomas. No suspicious liver masses. Normal gallbladder with no cholelithiasis. No biliary ductal dilatation. Common bile duct diameter 5 mm. No choledocholithiasis. Pancreas: There is an approximately 5.0 x 2.3 x 2.6 cm distal pancreatic body mass (series 8/ image 20), which demonstrates ill-defined infiltrative appearing margins and heterogeneous hypoenhancement, most consistent with a primary pancreatic adenocarcinoma. There is marked dilatation of the distal main pancreatic duct within the atrophic pancreatic tail (9 mm diameter). This pancreatic mass appears to encase the splenic artery, which remains patent. This pancreatic mass appears to abut the anterior aspect of the splenic vein, which is mildly narrowed and remains patent. This pancreatic mass appears to involve a small portion of the main portal vein wall at the portosplenic confluence, approximately 20-30% of the main portal vein circumference. Main portal vein remains normal in caliber and patent. Superior mesenteric artery appears uninvolved. No pancreas divisum. Spleen: Normal size. No mass. Adrenals/Urinary Tract: Normal adrenals. No hydronephrosis. Simple 1.7 cm renal cyst in the posterior lower right kidney. Small simple parapelvic renal cysts in the right kidney. No suspicious renal masses. Stomach/Bowel: Grossly normal stomach. Visualized small and large bowel is normal caliber, with no bowel wall thickening. Vascular/Lymphatic: Atherosclerotic nonaneurysmal abdominal aorta. Patent hepatic, left and right portal and renal veins. No pathologically enlarged lymph nodes in the abdomen. Other:  No abdominal ascites or focal fluid collection. Musculoskeletal: No aggressive appearing focal osseous lesions. IMPRESSION: 1. Infiltrative hypoenhancing 5.0 x 2.3 x 2.6 cm distal pancreatic body mass, most consistent with a primary pancreatic adenocarcinoma. Atrophy and prominent pancreatic duct dilation in the pancreatic tail. Recommend correlation with endoscopic ultrasound with fine-needle aspiration. 2. Vascular involvement by the pancreatic mass as follows: Encasement of the patent splenic artery. Mild narrowing and abutment of the splenic vein, which remains patent. Mild involvement of the main portal vein wall at the portosplenic confluence. Portal vein remains patent and normal in caliber. 3. No evidence of metastatic disease in the abdomen. 4. Faint tiny clustered nodular opacities in the anterior left lower lobe, correlating with mild tree-in-bud opacities on the CT study from 1 day prior, indicating a mild infectious or inflammatory bronchiolitis, with the differential including mild aspiration. 5. Additional findings include aortic atherosclerosis, mild diffuse hepatic steatosis and benign liver cysts and hamartomas. Electronically Signed   By: Ilona Sorrel M.D.   On: 11/28/2015 13:25   Ct Abdomen Pelvis W Contrast  Result Date: 11/27/2015 CLINICAL DATA:  Abdominal pain.  History of GI bleeding. EXAM: CT ABDOMEN AND PELVIS WITH CONTRAST TECHNIQUE: Multidetector CT imaging of the abdomen and pelvis was performed using the standard protocol following bolus administration of intravenous contrast. CONTRAST:  100 cc Isovue-300 intravenous COMPARISON:  None. FINDINGS: Lower chest:  No nodules. Hepatobiliary: Simple appearing cysts in the inferior left liver measuring nearly 7 cm. Numerous other low-density areas are well-defined and likely cysts or hamartomas, but limited by small size. No definitive liver metastasis. Pancreas: 32 mm long, 25 mm thick hypoenhancing mass in the pancreatic  body with ductal  obstruction and parenchymal atrophy seen at the level of the tail. On sagittal reformats the mass encases the mid splenic artery and splenic vein with mild splenic vein narrowing 1-2 cm beyond the portal vein confluence. No infiltration seen around the celiac axis or SMA. No peripancreatic lymph nodes. Spleen: Unremarkable. Adrenals/Urinary Tract: Negative adrenals. No hydronephrosis or stone. Unremarkable bladder. Stomach/Bowel:  No obstruction. No appendicitis. Vascular/Lymphatic: No acute vascular abnormality. Splenic vascular findings described on pancreas section. Atherosclerosis. No mass or adenopathy. Reproductive:No pathologic findings. Other: No ascites or peritoneal nodules. Musculoskeletal: No acute or aggressive finding IMPRESSION: Findings of pancreas body adenocarcinoma as described above. Recommend EUS/FNA. No definitive metastasis, but limited in the liver due to numerous sub-centimeter cysts/biliary hamartomas. Suggest liver staging with MRI. Electronically Signed   By: Monte Fantasia M.D.   On: 11/27/2015 15:09   Dg Chest Port 1 View  Result Date: 11/29/2015 CLINICAL DATA:  Shortness of breath. EXAM: PORTABLE CHEST 1 VIEW COMPARISON:  08/08/2015. FINDINGS: Mediastinum and hilar structures are normal. Heart size normal. Mild bibasilar atelectasis and/or infiltrates. Biapical pleural-parenchymal thickening noted consistent with scarring. No pleural effusion or pneumothorax. IMPRESSION: 1. Mild right base subsegmental atelectasis. Mild right base infiltrate cannot be excluded. 2.  Biapical pleural-parenchymal scarring . Electronically Signed   By: Marcello Moores  Register   On: 11/29/2015 07:57   US Abdomen Limited Ruq  Result Date: 11/27/2015 CLINICAL DATA:  One day of abdominal pain. EXAM: US ABDOMEN LIMITED - RIGHT UPPER QUADRANT COMPARISON:  CT of earlier today. FINDINGS: Gallbladder: No gallstones or wall thickening visualized. No sonographic Murphy sign noted by sonographer. Common bile duct:  Diameter: Normal, 6 mm. Liver: 8.0 cm left hepatic lobe cyst, as detailed on CT. This has minimal complexity within. IMPRESSION: Normal gallbladder and biliary tree. Left hepatic lobe complex cyst. Electronically Signed   By: Abigail Miyamoto M.D.   On: 11/27/2015 16:11    ASSESSMENT & PLAN:  Locally advanced pancreatic ca, [id number] I recommend completion of staging with CT scan of the chest with contrast She had borderline resectability based on partial portal vein involvement. However, I am concerned about her comorbidities including uncontrolled atrial fibrillation I recommend general surgery consultation for discussion about resectability She may be a candidate for neoadjuvant treatment followed by surgery I will get her case presented at the GI tumor board next week  Atrial fibrillation Defer to cardiologist for further management  Microcytic anemia Will recheck iron studies and replace as necessary  Nicotine dependency The patient is motivated to quit smoking. She is on nicotine patch  Discharge planning I reviewed the plan of care with the patient, her boyfriend and her daughter. If her symptoms improve over the weekend, she can be discharged with future outpatient follow-up. If CT scan showed metastatic cancer to the lungs, she would be considered stage IV and treatment would be palliative chemotherapy only. Due to difficulties with transportation, the patient desired treatment at [facility] and I can make appropriate referral to the regional cancer center there for follow-up in the future. If the patient remain here in the hospital by Monday, I will return to check on her and review test results.  All questions were answered. The patient knows to call the clinic with any problems, questions or concerns.    Heath Lark, MD 11/30/2015 3:15 PM

## 2015-11-30 NOTE — Progress Notes (Addendum)
[facility]                                                                              Patient Demographics  Lindsay Salazar, is a 65 y.o. female, DOB - 02/21/1950, DJ:[id number]  Admit date - 11/27/2015   Admitting Physician Karmen Bongo, MD  Outpatient Primary MD for the patient is [name], PA-C  Outpatient specialists:   LOS - 3  days    Chief Complaint  Patient presents with  . Abdominal Pain       Brief summary  65 year old female with history of GERD, hypertension, fibromyalgia, COPD on intermittent home O2, active tobacco use and A. fib on anticoagulation presented to [facility] ED with burning epigastric pain. She was hospitalized in July this year for peptic ulcer disease. Patient reports feeling dizzy intermittently. She is unsure about weight loss and reports that she has not been eating well due to financial crisis and not having enough food at home. In the ED she was in A. fib with occasional RVR. She had leukocytosis and mildly tachypneic meeting SIRS criteria. Blood work showed normal hemoglobin, begin of 22 and creatinine of 1. CT of the abdomen and pelvis showed a hypoechogenic mass in the pancreatic body with Dr. obstruction suggestive of getting adenocarcinoma.Transferred to [facility] for EUS with FNA.   Assessment & Plan     Pancreatic mass/Adenocarcinoma -MRI abdomen for further evaluation. CA 19-9 194 -EUS with FNA biopsy done on 10/26, results pending, preliminary cytology per EUS report likely adenocarcinoma - Called surgery consult and discussed with Dr Alvy Bimler for oncology    Tobacco abuse Counseled on cessation.  COPD with Acute bronchitis, possible right base infiltrate/pneumonia - Placed on doxycycline, Tessalon, Robitussin - Counseled on smoking cessation, continue nebs    Atrial fibrillation (HCC) with RVR -Heart rate controlled, Cardizem and metoprolol increased  metoprolol to 25 twice a day.  - Switch to  long-acting Cardizem once heart rate better controlled.   - Heparin restarted, once cleared by GI, will restart xarelto   Poor home living situation. Patient reported that she is very poor and unable to afford food and neither has running water at home. Social work consulted to provide necessary support.  Code Status: Full CODE STATUS  DVT Prophylaxis:   heparin Family Communication: Discussed in detail with the patient, all imaging results, lab results explained to the patient   Disposition Plan:   Time Spent in minutes 25 minutes  Procedures:    Consultants:   Gastroenterology  Antimicrobials:      Medications  Scheduled Meds: . benzonatate  100 mg Oral TID  . diltiazem  60 mg Oral Q6H  . doxycycline  100 mg Oral Q12H  . escitalopram  10 mg Oral QPM  . ipratropium-albuterol  3 mL Inhalation TID  . metoprolol tartrate  37.5 mg Oral BID  . mometasone-formoterol  2 puff Inhalation BID  . nicotine  14 mg Transdermal Daily   Continuous Infusions: . heparin 1,000 Units/hr (11/30/15 1012)   PRN Meds:.acetaminophen **OR** acetaminophen, albuterol, guaiFENesin-codeine, HYDROcodone-acetaminophen, metoprolol, metoprolol, morphine injection, ondansetron **OR** ondansetron (ZOFRAN) IV   Antibiotics  Anti-infectives    Start     Dose/Rate Route Frequency Ordered Stop   11/30/15 1000  doxycycline (VIBRA-TABS) tablet 100 mg     100 mg Oral Every 12 hours 11/30/15 [id number]          Subjective:   Lindsay Salazar was seen and examined today. Heart rate better controlled however states she's been coughing since last night, no fevers or chills. Patient denies dizziness, abdominal pain, N/V/D/C, new weakness, numbess, tingling.   Objective:   Vitals:   11/29/15 2021 11/29/15 2105 11/30/15 0042 11/30/15 0457  BP:  121/73 126/77 138/86  Pulse:  80 79 98  Resp:  18  20  Temp:  98.3 F (36.8 C)  98.4 F (36.9 C)  TempSrc:  Oral  Oral  SpO2: 95% 93%  94%  Weight:      Height:         Intake/Output Summary (Last 24 hours) at 11/30/15 1141 Last data filed at 11/30/15 0245  Gross per 24 hour  Intake              620 ml  Output              700 ml  Net              -80 ml     Wt Readings from Last 3 Encounters:  11/27/15 59.4 kg (131 lb)  10/24/15 58.5 kg (129 lb)  10/10/15 59.8 kg (131 lb 12.8 oz)     Exam  General: Alert and oriented x 3, NAD  HEENT:    Neck: Supple, no JVD  Cardiovascular: S1 S2 auscultated,irregular,  no rubs, murmurs or gallops  Respiratory: Decreased breath sounds at the bases with scattered wheezing  Gastrointestinal: Soft, nontender, nondistended, + bowel sounds  Ext: no cyanosis clubbing or edema  Neuro: AAOx3, Cr N's II- XII. Strength 5/5 upper and lower extremities bilaterally  Skin: No rashes  Psych: Normal affect and demeanor, alert and oriented x3    Data Reviewed:  I have personally reviewed following labs and imaging studies  Micro Results No results found for this or any previous visit (from the past 240 hour(s)).  Radiology Reports Mr Abdomen W Wo Contrast  Result Date: 11/28/2015 CLINICAL DATA:  65 year old female inpatient with abdominal pain and newly diagnosed pancreatic body mass on a CT study from 1 day prior. EXAM: MRI ABDOMEN WITHOUT AND WITH CONTRAST TECHNIQUE: Multiplanar multisequence MR imaging of the abdomen was performed both before and after the administration of intravenous contrast. CONTRAST:  4mL MULTIHANCE GADOBENATE DIMEGLUMINE 529 MG/ML IV SOLN COMPARISON:  11/27/2015 CT abdomen/pelvis. FINDINGS: Many of the sequences are motion degraded. Lower chest: Faint tiny clustered nodular opacities in the anterior left lower lobe (series 1404/ image 45), correlating with mild tree-in-bud opacities on the CT study from 1 day prior. Hepatobiliary: Normal liver size and configuration. Mild diffuse hepatic steatosis. There is a simple 6.7 x 6.5 cm posterior left liver lobe cyst. There is a simple 1.1  cm segment 6 right liver lobe cyst. There are innumerable (> than 30) subcentimeter T2 hyperintense nonenhancing lesions scattered throughout the liver most consistent with biliary hamartomas. No suspicious liver masses. Normal gallbladder with no cholelithiasis. No biliary ductal dilatation. Common bile duct diameter 5 mm. No choledocholithiasis. Pancreas: There is an approximately 5.0 x 2.3 x 2.6 cm distal pancreatic body mass (series 8/ image 20), which demonstrates ill-defined infiltrative appearing margins and heterogeneous hypoenhancement, most consistent with a primary pancreatic adenocarcinoma.  There is marked dilatation of the distal main pancreatic duct within the atrophic pancreatic tail (9 mm diameter). This pancreatic mass appears to encase the splenic artery, which remains patent. This pancreatic mass appears to abut the anterior aspect of the splenic vein, which is mildly narrowed and remains patent. This pancreatic mass appears to involve a small portion of the main portal vein wall at the portosplenic confluence, approximately 20-30% of the main portal vein circumference. Main portal vein remains normal in caliber and patent. Superior mesenteric artery appears uninvolved. No pancreas divisum. Spleen: Normal size. No mass. Adrenals/Urinary Tract: Normal adrenals. No hydronephrosis. Simple 1.7 cm renal cyst in the posterior lower right kidney. Small simple parapelvic renal cysts in the right kidney. No suspicious renal masses. Stomach/Bowel: Grossly normal stomach. Visualized small and large bowel is normal caliber, with no bowel wall thickening. Vascular/Lymphatic: Atherosclerotic nonaneurysmal abdominal aorta. Patent hepatic, left and right portal and renal veins. No pathologically enlarged lymph nodes in the abdomen. Other: No abdominal ascites or focal fluid collection. Musculoskeletal: No aggressive appearing focal osseous lesions. IMPRESSION: 1. Infiltrative hypoenhancing 5.0 x 2.3 x 2.6 cm  distal pancreatic body mass, most consistent with a primary pancreatic adenocarcinoma. Atrophy and prominent pancreatic duct dilation in the pancreatic tail. Recommend correlation with endoscopic ultrasound with fine-needle aspiration. 2. Vascular involvement by the pancreatic mass as follows: Encasement of the patent splenic artery. Mild narrowing and abutment of the splenic vein, which remains patent. Mild involvement of the main portal vein wall at the portosplenic confluence. Portal vein remains patent and normal in caliber. 3. No evidence of metastatic disease in the abdomen. 4. Faint tiny clustered nodular opacities in the anterior left lower lobe, correlating with mild tree-in-bud opacities on the CT study from 1 day prior, indicating a mild infectious or inflammatory bronchiolitis, with the differential including mild aspiration. 5. Additional findings include aortic atherosclerosis, mild diffuse hepatic steatosis and benign liver cysts and hamartomas. Electronically Signed   By: Ilona Sorrel M.D.   On: 11/28/2015 13:25   Ct Abdomen Pelvis W Contrast  Result Date: 11/27/2015 CLINICAL DATA:  Abdominal pain.  History of GI bleeding. EXAM: CT ABDOMEN AND PELVIS WITH CONTRAST TECHNIQUE: Multidetector CT imaging of the abdomen and pelvis was performed using the standard protocol following bolus administration of intravenous contrast. CONTRAST:  100 cc Isovue-300 intravenous COMPARISON:  None. FINDINGS: Lower chest:  No nodules. Hepatobiliary: Simple appearing cysts in the inferior left liver measuring nearly 7 cm. Numerous other low-density areas are well-defined and likely cysts or hamartomas, but limited by small size. No definitive liver metastasis. Pancreas: 32 mm long, 25 mm thick hypoenhancing mass in the pancreatic body with ductal obstruction and parenchymal atrophy seen at the level of the tail. On sagittal reformats the mass encases the mid splenic artery and splenic vein with mild splenic vein  narrowing 1-2 cm beyond the portal vein confluence. No infiltration seen around the celiac axis or SMA. No peripancreatic lymph nodes. Spleen: Unremarkable. Adrenals/Urinary Tract: Negative adrenals. No hydronephrosis or stone. Unremarkable bladder. Stomach/Bowel:  No obstruction. No appendicitis. Vascular/Lymphatic: No acute vascular abnormality. Splenic vascular findings described on pancreas section. Atherosclerosis. No mass or adenopathy. Reproductive:No pathologic findings. Other: No ascites or peritoneal nodules. Musculoskeletal: No acute or aggressive finding IMPRESSION: Findings of pancreas body adenocarcinoma as described above. Recommend EUS/FNA. No definitive metastasis, but limited in the liver due to numerous sub-centimeter cysts/biliary hamartomas. Suggest liver staging with MRI. Electronically Signed   By: Monte Fantasia M.D.   On:  11/27/2015 15:09   Dg Chest Port 1 View  Result Date: 11/29/2015 CLINICAL DATA:  Shortness of breath. EXAM: PORTABLE CHEST 1 VIEW COMPARISON:  08/08/2015. FINDINGS: Mediastinum and hilar structures are normal. Heart size normal. Mild bibasilar atelectasis and/or infiltrates. Biapical pleural-parenchymal thickening noted consistent with scarring. No pleural effusion or pneumothorax. IMPRESSION: 1. Mild right base subsegmental atelectasis. Mild right base infiltrate cannot be excluded. 2.  Biapical pleural-parenchymal scarring . Electronically Signed   By: Marcello Moores  Register   On: 11/29/2015 07:57   US Abdomen Limited Ruq  Result Date: 11/27/2015 CLINICAL DATA:  One day of abdominal pain. EXAM: US ABDOMEN LIMITED - RIGHT UPPER QUADRANT COMPARISON:  CT of earlier today. FINDINGS: Gallbladder: No gallstones or wall thickening visualized. No sonographic Murphy sign noted by sonographer. Common bile duct: Diameter: Normal, 6 mm. Liver: 8.0 cm left hepatic lobe cyst, as detailed on CT. This has minimal complexity within. IMPRESSION: Normal gallbladder and biliary tree.  Left hepatic lobe complex cyst. Electronically Signed   By: Abigail Miyamoto M.D.   On: 11/27/2015 16:11    Lab Data:  CBC:  Recent Labs Lab 11/27/15 1154 11/28/15 0505 11/29/15 0413 11/30/15 0639  WBC 24.5* 17.8* 15.8* 19.1*  HGB 12.1 11.3* 11.1* 11.1*  HCT 38.3 36.3 35.8* 35.6*  MCV 79.5 77.6* 78.5 79.8  PLT 267 271 287 [id number]   Basic Metabolic Panel:  Recent Labs Lab 11/27/15 1154 11/28/15 0505  NA 138 135  K 3.3* 3.7  CL 104 103  CO2 26 23  GLUCOSE 128* 110*  BUN 22* 13  CREATININE 1.06* 0.90  CALCIUM 8.7* 8.0*   GFR: Estimated Creatinine Clearance: 52.4 mL/min (by C-G formula based on SCr of 0.9 mg/dL). Liver Function Tests:  Recent Labs Lab 11/27/15 1154  AST 19  ALT 26  ALKPHOS 92  BILITOT 0.5  PROT 6.9  ALBUMIN 3.4*    Recent Labs Lab 11/27/15 1154  LIPASE 19   No results for input(s): AMMONIA in the last 168 hours. Coagulation Profile: No results for input(s): INR, PROTIME in the last 168 hours. Cardiac Enzymes:  Recent Labs Lab 11/27/15 1245 11/29/15 0747  TROPONINI <0.03 <0.03   BNP (last 3 results) No results for input(s): PROBNP in the last 8760 hours. HbA1C: No results for input(s): HGBA1C in the last 72 hours. CBG: No results for input(s): GLUCAP in the last 168 hours. Lipid Profile: No results for input(s): CHOL, HDL, LDLCALC, TRIG, CHOLHDL, LDLDIRECT in the last 72 hours. Thyroid Function Tests: No results for input(s): TSH, T4TOTAL, FREET4, T3FREE, THYROIDAB in the last 72 hours. Anemia Panel: No results for input(s): VITAMINB12, FOLATE, FERRITIN, TIBC, IRON, RETICCTPCT in the last 72 hours. Urine analysis:    Component Value Date/Time   COLORURINE YELLOW 11/27/2015 [place] 11/27/2015 1513   LABSPEC <1.005 (L) 11/27/2015 1513   PHURINE 6.0 11/27/2015 1513   GLUCOSEU NEGATIVE 11/27/2015 1513   HGBUR MODERATE (A) 11/27/2015 1513   BILIRUBINUR NEGATIVE 11/27/2015 1513   KETONESUR NEGATIVE 11/27/2015 1513     PROTEINUR TRACE (A) 11/27/2015 1513   NITRITE NEGATIVE 11/27/2015 1513   LEUKOCYTESUR NEGATIVE 11/27/2015 1513     Luz Mares M.D. [facility] 11/30/2015, 11:41 AM  Pager: [phone] Between 7am to 7pm - call Pager - [phone]  After 7pm go to [email or web address] - password [email or web address]  Call night coverage person covering after 7pm

## 2015-12-01 ENCOUNTER — Encounter (HOSPITAL_COMMUNITY): Payer: Self-pay | Admitting: *Deleted

## 2015-12-01 ENCOUNTER — Inpatient Hospital Stay (HOSPITAL_COMMUNITY): Payer: Medicare HMO

## 2015-12-01 DIAGNOSIS — R079 Chest pain, unspecified: Secondary | ICD-10-CM

## 2015-12-01 LAB — CBC
HEMATOCRIT: 32.9 % — AB (ref 36.0–46.0)
HEMOGLOBIN: 10.2 g/dL — AB (ref 12.0–15.0)
MCH: 24.1 pg — ABNORMAL LOW (ref 26.0–34.0)
MCHC: 31 g/dL (ref 30.0–36.0)
MCV: 77.6 fL — ABNORMAL LOW (ref 78.0–100.0)
Platelets: 286 10*3/uL (ref 150–400)
RBC: 4.24 MIL/uL (ref 3.87–5.11)
RDW: 18.4 % — ABNORMAL HIGH (ref 11.5–15.5)
WBC: 16.5 10*3/uL — AB (ref 4.0–10.5)

## 2015-12-01 LAB — FERRITIN: FERRITIN: 59 ng/mL (ref 11–307)

## 2015-12-01 LAB — IRON AND TIBC
IRON: 10 ug/dL — AB (ref 28–170)
SATURATION RATIOS: 3 % — AB (ref 10.4–31.8)
TIBC: 319 ug/dL (ref 250–450)
UIBC: 309 ug/dL

## 2015-12-01 LAB — HEPARIN LEVEL (UNFRACTIONATED): HEPARIN UNFRACTIONATED: 0.29 [IU]/mL — AB (ref 0.30–0.70)

## 2015-12-01 MED ORDER — IOPAMIDOL (ISOVUE-300) INJECTION 61%
INTRAVENOUS | Status: AC
  Administered 2015-12-01: 75 mL
  Filled 2015-12-01: qty 75

## 2015-12-01 MED ORDER — RIVAROXABAN 20 MG PO TABS
20.0000 mg | ORAL_TABLET | Freq: Every day | ORAL | Status: DC
  Administered 2015-12-01 – 2015-12-02 (×2): 20 mg via ORAL
  Filled 2015-12-01 (×2): qty 1

## 2015-12-01 NOTE — Progress Notes (Signed)
[facility] Progress Note  2 Days Post-Op  Subjective: NAE. Anxious and upset about her diagnosis. Denies pain.  Objective: Vital signs in last 24 hours: Temp:  [98.5 F (36.9 C)-99.2 F (37.3 C)] 98.5 F (36.9 C) (10/28 0518) Pulse Rate:  [68-121] 68 (10/28 0518) Resp:  [16-22] 18 (10/28 0518) BP: (114-130)/(35-72) 114/60 (10/28 0518) SpO2:  [91 %-95 %] 95 % (10/28 0753) Last BM Date: 11/30/15  Intake/Output from previous day: 10/27 0701 - 10/28 0700 In: 1149.7 [P.O.:960; I.V.:189.7] Out: 800 [Urine:800] Intake/Output this shift: No intake/output data recorded.  PE: Gen:  Alert, NAD, pleasant Pulm:  Breathing nonlabored. BL wheezes Abd: Soft, NT/ND, +BS  Lab Results:   Recent Labs  11/29/15 0413 11/30/15 0639  WBC 15.8* 19.1*  HGB 11.1* 11.1*  HCT 35.8* 35.6*  PLT 287 299   CMP     Component Value Date/Time   NA 135 11/28/2015 0505   K 3.7 11/28/2015 0505   CL 103 11/28/2015 0505   CO2 23 11/28/2015 0505   GLUCOSE 110 (H) 11/28/2015 0505   BUN 13 11/28/2015 0505   CREATININE 0.90 11/28/2015 0505   CALCIUM 8.0 (L) 11/28/2015 0505   PROT 6.9 11/27/2015 1154   ALBUMIN 3.4 (L) 11/27/2015 1154   AST 19 11/27/2015 1154   ALT 26 11/27/2015 1154   ALKPHOS 92 11/27/2015 1154   BILITOT 0.5 11/27/2015 1154   GFRNONAA >60 11/28/2015 0505   GFRAA >60 11/28/2015 0505   Lipase     Component Value Date/Time   LIPASE 19 11/27/2015 1154   Anti-infectives: Anti-infectives    Start     Dose/Rate Route Frequency Ordered Stop   11/30/15 1000  doxycycline (VIBRA-TABS) tablet 100 mg     100 mg Oral Every 12 hours 11/30/15 0748       Assessment/Plan adenocarcinoma of pancreatic body -  CT 10/24: 32 mm long, 25 mm thick hypoenhancing mass in the pancreatic body -  MRI 10/25: 5 x 2.3 x 2.6 cm mass with ill-defined infiltrative appearing margins and heterogeneous hypoenhancement, most consistent with a primary pancreatic adenocarcinoma; mass encases  splenic artery and abuts the anterior aspect of splenic vein; involves a small portion of the main portal vein. All vessels patent at this time. SMA uninvolved/ -  10/26: Endoscopy with FNA; MALIGNANT CELLS CONSISTENT WITH ADENOCARCINOMA -  CA 19-9 is elevated 194  - CT chest today to r/o metastatic disease to lungs   - presentation of case at GI tumor board.  Plan: CT chest for accurate cancer staging. If cancer is not metastatic, consider neoadjuvant chemotherapy followed by restaging of the tumor and reconsideration of surgical resection. We would recommend surgical follow up with Dr. Stark Klein. In the absence of metastatic disease, the patient may be discharged from the hospital early next week with appropriate outpatient follow up.  Metastatic disease would result in palliative chemotherapy.   General surgery will sign off but will be available as needed.   LOS: 4 days    [facility] 12/01/2015, 8:39 AM Pager: [phone] Consults: [phone] Mon-Fri 7:00 am-4:30 pm Sat-Sun 7:00 am-11:30 am

## 2015-12-01 NOTE — Progress Notes (Signed)
ANTICOAGULATION CONSULT NOTE - Initial Consult  Pharmacy Consult for Xarelto  Indication: atrial fibrillation  Allergies  Allergen Reactions  . Ibuprofen Nausea And Vomiting  . Penicillins Swelling    Has patient had a PCN reaction causing immediate rash, facial/tongue/throat swelling, SOB or lightheadedness with hypotension: Yes Has patient had a PCN reaction causing severe rash involving mucus membranes or skin necrosis: No Has patient had a PCN reaction that required hospitalization No Has patient had a PCN reaction occurring within the last 10 years: No If all of the above answers are "NO", then may proceed with Cephalosporin use.   . Tiotropium Bromide Monohydrate Itching  . Tramadol Nausea And Vomiting  . Vicodin [Hydrocodone-Acetaminophen] Nausea And Vomiting    Patient Measurements: Height: 5\' 4"  (162.6 cm) Weight: 131 lb (59.4 kg) IBW/kg (Calculated) : 54.7   Vital Signs: Temp: 98.5 F (36.9 C) (10/28 0518) BP: 114/60 (10/28 0518) Pulse Rate: 68 (10/28 0518)  Labs:  Recent Labs  11/28/15 2118  11/29/15 0413 11/29/15 0747 11/30/15 [id number] 11/30/15 1607 11/30/15 2313 12/01/15 0605 12/01/15 0710  HGB  --   < > 11.1*  --  11.1*  --   --  10.2*  --   HCT  --   --  35.8*  --  35.6*  --   --  32.9*  --   PLT  --   --  287  --  299  --   --  286  --   APTT 67*  --  55*  --   --   --   --   --   --   HEPARINUNFRC  --   < > 0.31  --   --  0.16* 0.20*  --  0.29*  TROPONINI  --   --   --  <0.03  --   --   --   --   --   < > = values in this interval not displayed.  Estimated Creatinine Clearance: 52.4 mL/min (by C-G formula based on SCr of 0.9 mg/dL).   Medical History: Past Medical History:  Diagnosis Date  . Anginal pain ([place])    thought she was having   heartburn  . Arthritis    bursitis , fibromyalgia  . Arthritis    "right hip" (11/27/2015)  . Asthma   . Atrial fibrillation ([place])   . Chronic bronchitis ([place])   . Chronic right hip pain   . COPD  (chronic obstructive pulmonary disease) ([place])    wears home o2 prn  . Daily headache    "last 2-3 months" (11/27/2015)  . Exposure to TB    "mom had it when she was pregnant with me"  . Fibromyalgia   . GERD (gastroesophageal reflux disease)    use to have it but not now  . Heart murmur    76-  29 years old  . Hypertension   . On home oxygen therapy    11/27/2015 "whenever I need it; don't know how many liters"  . Pneumonia    "several times" (11/27/2015)  . Pneumothorax       Assessment: 65 yo who was on Xarelto PTA for non-valvular atrial fibrillation. Xarelto was being held for work- up of a pancreatic mass and patient was transitioned to heparin. We will now re-start Xarelto and D/C heparin.   CBC is stable. Renal function is borderline at ~52 ml/min. Will start 20 mg once daily and watch closely.   Spoke with nurse and she  will D/C heparin at same time as first Xarelto dose.    Plan:  Xarelto 20 mg once a day with evening meal Monitor renal function, CBC, signs/symptoms of bleeding  Dose adjust as appropriate   Ihor Austin, PharmD PGY1 Pharmacy Resident Pager: [phone] 12/01/2015,10:22 AM

## 2015-12-01 NOTE — Progress Notes (Signed)
ANTICOAGULATION CONSULT NOTE - Follow Up Consult  Pharmacy Consult for Heparin (Xarelto on hold) Indication: atrial fibrillation  Allergies  Allergen Reactions  . Ibuprofen Nausea And Vomiting  . Penicillins Swelling    Has patient had a PCN reaction causing immediate rash, facial/tongue/throat swelling, SOB or lightheadedness with hypotension: Yes Has patient had a PCN reaction causing severe rash involving mucus membranes or skin necrosis: No Has patient had a PCN reaction that required hospitalization No Has patient had a PCN reaction occurring within the last 10 years: No If all of the above answers are "NO", then may proceed with Cephalosporin use.   . Tiotropium Bromide Monohydrate Itching  . Tramadol Nausea And Vomiting  . Vicodin [Hydrocodone-Acetaminophen] Nausea And Vomiting    Patient Measurements: Height: 5\' 4"  (162.6 cm) Weight: 131 lb (59.4 kg) IBW/kg (Calculated) : 54.7   Vital Signs: Temp: 98.5 F (36.9 C) (10/28 0518) BP: 114/60 (10/28 0518) Pulse Rate: 68 (10/28 0518)  Labs:  Recent Labs  11/28/15 2118  11/29/15 0413 11/29/15 0747 11/30/15 [id number] 11/30/15 1607 11/30/15 2313 12/01/15 0605 12/01/15 0710  HGB  --   < > 11.1*  --  11.1*  --   --  10.2*  --   HCT  --   --  35.8*  --  35.6*  --   --  32.9*  --   PLT  --   --  287  --  299  --   --  286  --   APTT 67*  --  55*  --   --   --   --   --   --   HEPARINUNFRC  --   < > 0.31  --   --  0.16* 0.20*  --  0.29*  TROPONINI  --   --   --  <0.03  --   --   --   --   --   < > = values in this interval not displayed.  Estimated Creatinine Clearance: 52.4 mL/min (by C-G formula based on SCr of 0.9 mg/dL).   Assessment: Heparin for a- fib while Xarelto on hold. Xarelto is on hold due to a work- up of a pancreatic mass. Patient to get a CT today for staging. Heparin level remains slightly subtherapeutic on 1300 units/hr. Nurse reports no issues with bleeding or infusion. CBC remains stable.   Goal of  Therapy:  Heparin level 0.3-0.7 units/ml Monitor platelets by anticoagulation protocol: Yes   Plan:  -Increase heparin to 1400 units/hr -6 hour heparin level -Follow-up long-term anticoagulation plans due to new oncologic work-up   Ihor Austin, PharmD PGY1 Pharmacy Resident Pager: [phone] 12/01/2015,9:34 AM

## 2015-12-01 NOTE — Progress Notes (Signed)
[facility]                                                                              Patient Demographics  Lindsay Salazar, is a 65 y.o. female, DOB - 1950-08-31, [id number]  Admit date - 11/27/2015   Admitting Physician Karmen Bongo, MD  Outpatient Primary MD for the patient is [name], PA-C  Outpatient specialists:   LOS - 4  days    Chief Complaint  Patient presents with  . Abdominal Pain       Brief summary  65 year old female with history of GERD, hypertension, fibromyalgia, COPD on intermittent home O2, active tobacco use and A. fib on anticoagulation presented to [facility] ED with burning epigastric pain. She was hospitalized in July this year for peptic ulcer disease. Patient reports feeling dizzy intermittently. She is unsure about weight loss and reports that she has not been eating well due to financial crisis and not having enough food at home. In the ED she was in A. fib with occasional RVR. She had leukocytosis and mildly tachypneic meeting SIRS criteria. Blood work showed normal hemoglobin, begin of 22 and creatinine of 1. CT of the abdomen and pelvis showed a hypoechogenic mass in the pancreatic body with Dr. obstruction suggestive of getting adenocarcinoma.Transferred to [facility] for E[facility] with FNA.   Assessment & Plan     Pancreatic mass/Adenocarcinoma -MRI abdomen for further evaluation. CA [id number] -EUS with FNA biopsy done on 10/26, results pending, preliminary cytology per EUS report likely adenocarcinoma - Appreciate general surgery and oncology recommendations, CT chest today for cancer staging. If no metastasis, new adjuvant chemotherapy followed by restaging of the tumor and then surgical resection. Outpatient follow-up with Dr. Barry Dienes. Also discussed with Dr. Alvy Bimler, who will coordinate follow-up as patient lives in [place] and has transportation issues.    Tobacco abuse Counseled on cessation.  COPD with Acute  bronchitis, possible right base infiltrate/pneumonia - Placed on doxycycline, Tessalon, Robitussin - Counseled on smoking cessation, continue nebs    Atrial fibrillation (HCC) with RVR -Heart rate controlled, Cardizem and metoprolol increased  - Switch to long-acting Cardizem once heart rate better controlled.   -  will restart xarelto today and DC heparin  Poor home living situation. Patient reported that she is very poor and unable to afford food and neither has running water at home. Social work consulted to provide necessary support.  Code Status: Full CODE STATUS  DVT Prophylaxis:   heparin Family Communication: Discussed in detail with the patient, all imaging results, lab results explained to the patient   Disposition Plan: possible dc in am  Time Spent in minutes 25 minutes  Procedures:    Consultants:   Gastroenterology Oncology General surgery  Antimicrobials:   Doxycycline 10/27   Medications  Scheduled Meds: . benzonatate  100 mg Oral TID  . diltiazem  60 mg Oral Q6H  . doxycycline  100 mg Oral Q12H  . escitalopram  10 mg Oral QPM  . ipratropium-albuterol  3 mL Inhalation TID  . metoprolol tartrate  37.5 mg Oral BID  . mometasone-formoterol  2 puff Inhalation BID  .  nicotine  14 mg Transdermal Daily   Continuous Infusions: . heparin 1,300 Units/hr (12/01/15 0531)   PRN Meds:.acetaminophen **OR** acetaminophen, albuterol, guaiFENesin-codeine, HYDROcodone-acetaminophen, metoprolol, metoprolol, morphine injection, ondansetron **OR** ondansetron (ZOFRAN) IV   Antibiotics   Anti-infectives    Start     Dose/Rate Route Frequency Ordered Stop   11/30/15 1000  doxycycline (VIBRA-TABS) tablet 100 mg     100 mg Oral Every 12 hours 11/30/15 0748          Subjective:   Lindsay Salazar was seen and examined today. Overall feeling somewhat better although still coughing and bringing phlegm. No fevers. Heart rate better controlled. Patient denies dizziness,  abdominal pain, N/V/D/C, new weakness, numbess, tingling.   Objective:   Vitals:   11/30/15 2012 11/30/15 2130 12/01/15 0518 12/01/15 0753  BP:  130/71 114/60   Pulse:  (!) 121 68   Resp:  (!) 22 18   Temp:  98.9 F (37.2 C) 98.5 F (36.9 C)   TempSrc:  Oral    SpO2: 91% 91% 95% 95%  Weight:      Height:        Intake/Output Summary (Last 24 hours) at 12/01/15 1016 Last data filed at 12/01/15 0616  Gross per 24 hour  Intake           789.74 ml  Output              800 ml  Net           -10.26 ml     Wt Readings from Last 3 Encounters:  11/27/15 59.4 kg (131 lb)  10/24/15 58.5 kg (129 lb)  10/10/15 59.8 kg (131 lb 12.8 oz)     Exam  General: Alert and oriented x 3, NAD  HEENT:    Neck: Supple, no JVD  Cardiovascular: S1 S2 auscultated,irregular,  no rubs, murmurs or gallops  Respiratory: Decreased breath sounds at the bases  Gastrointestinal: Soft, nontender, nondistended, + bowel sounds  Ext: no cyanosis clubbing or edema  Neuro: No new deficits  Skin: No rashes  Psych: Normal affect and demeanor, alert and oriented x3    Data Reviewed:  I have personally reviewed following labs and imaging studies  Micro Results No results found for this or any previous visit (from the past 240 hour(s)).  Radiology Reports Mr Abdomen W Wo Contrast  Result Date: 11/28/2015 CLINICAL DATA:  65 year old female inpatient with abdominal pain and newly diagnosed pancreatic body mass on a CT study from 1 day prior. EXAM: MRI ABDOMEN WITHOUT AND WITH CONTRAST TECHNIQUE: Multiplanar multisequence MR imaging of the abdomen was performed both before and after the administration of intravenous contrast. CONTRAST:  56mL MULTIHANCE GADOBENATE DIMEGLUMINE 529 MG/ML IV SOLN COMPARISON:  11/27/2015 CT abdomen/pelvis. FINDINGS: Many of the sequences are motion degraded. Lower chest: Faint tiny clustered nodular opacities in the anterior left lower lobe (series 1404/ image 45),  correlating with mild tree-in-bud opacities on the CT study from 1 day prior. Hepatobiliary: Normal liver size and configuration. Mild diffuse hepatic steatosis. There is a simple 6.7 x 6.5 cm posterior left liver lobe cyst. There is a simple 1.1 cm segment 6 right liver lobe cyst. There are innumerable (> than 30) subcentimeter T2 hyperintense nonenhancing lesions scattered throughout the liver most consistent with biliary hamartomas. No suspicious liver masses. Normal gallbladder with no cholelithiasis. No biliary ductal dilatation. Common bile duct diameter 5 mm. No choledocholithiasis. Pancreas: There is an approximately 5.0 x 2.3 x 2.6 cm distal  pancreatic body mass (series 8/ image 20), which demonstrates ill-defined infiltrative appearing margins and heterogeneous hypoenhancement, most consistent with a primary pancreatic adenocarcinoma. There is marked dilatation of the distal main pancreatic duct within the atrophic pancreatic tail (9 mm diameter). This pancreatic mass appears to encase the splenic artery, which remains patent. This pancreatic mass appears to abut the anterior aspect of the splenic vein, which is mildly narrowed and remains patent. This pancreatic mass appears to involve a small portion of the main portal vein wall at the portosplenic confluence, approximately 20-30% of the main portal vein circumference. Main portal vein remains normal in caliber and patent. Superior mesenteric artery appears uninvolved. No pancreas divisum. Spleen: Normal size. No mass. Adrenals/Urinary Tract: Normal adrenals. No hydronephrosis. Simple 1.7 cm renal cyst in the posterior lower right kidney. Small simple parapelvic renal cysts in the right kidney. No suspicious renal masses. Stomach/Bowel: Grossly normal stomach. Visualized small and large bowel is normal caliber, with no bowel wall thickening. Vascular/Lymphatic: Atherosclerotic nonaneurysmal abdominal aorta. Patent hepatic, left and right portal and renal  veins. No pathologically enlarged lymph nodes in the abdomen. Other: No abdominal ascites or focal fluid collection. Musculoskeletal: No aggressive appearing focal osseous lesions. IMPRESSION: 1. Infiltrative hypoenhancing 5.0 x 2.3 x 2.6 cm distal pancreatic body mass, most consistent with a primary pancreatic adenocarcinoma. Atrophy and prominent pancreatic duct dilation in the pancreatic tail. Recommend correlation with endoscopic ultrasound with fine-needle aspiration. 2. Vascular involvement by the pancreatic mass as follows: Encasement of the patent splenic artery. Mild narrowing and abutment of the splenic vein, which remains patent. Mild involvement of the main portal vein wall at the portosplenic confluence. Portal vein remains patent and normal in caliber. 3. No evidence of metastatic disease in the abdomen. 4. Faint tiny clustered nodular opacities in the anterior left lower lobe, correlating with mild tree-in-bud opacities on the CT study from 1 day prior, indicating a mild infectious or inflammatory bronchiolitis, with the differential including mild aspiration. 5. Additional findings include aortic atherosclerosis, mild diffuse hepatic steatosis and benign liver cysts and hamartomas. Electronically Signed   By: Ilona Sorrel M.D.   On: 11/28/2015 13:25   Ct Abdomen Pelvis W Contrast  Result Date: 11/27/2015 CLINICAL DATA:  Abdominal pain.  History of GI bleeding. EXAM: CT ABDOMEN AND PELVIS WITH CONTRAST TECHNIQUE: Multidetector CT imaging of the abdomen and pelvis was performed using the standard protocol following bolus administration of intravenous contrast. CONTRAST:  100 cc Isovue-300 intravenous COMPARISON:  None. FINDINGS: Lower chest:  No nodules. Hepatobiliary: Simple appearing cysts in the inferior left liver measuring nearly 7 cm. Numerous other low-density areas are well-defined and likely cysts or hamartomas, but limited by small size. No definitive liver metastasis. Pancreas: 32 mm  long, 25 mm thick hypoenhancing mass in the pancreatic body with ductal obstruction and parenchymal atrophy seen at the level of the tail. On sagittal reformats the mass encases the mid splenic artery and splenic vein with mild splenic vein narrowing 1-2 cm beyond the portal vein confluence. No infiltration seen around the celiac axis or SMA. No peripancreatic lymph nodes. Spleen: Unremarkable. Adrenals/Urinary Tract: Negative adrenals. No hydronephrosis or stone. Unremarkable bladder. Stomach/Bowel:  No obstruction. No appendicitis. Vascular/Lymphatic: No acute vascular abnormality. Splenic vascular findings described on pancreas section. Atherosclerosis. No mass or adenopathy. Reproductive:No pathologic findings. Other: No ascites or peritoneal nodules. Musculoskeletal: No acute or aggressive finding IMPRESSION: Findings of pancreas body adenocarcinoma as described above. Recommend EUS/FNA. No definitive metastasis, but limited in the liver  due to numerous sub-centimeter cysts/biliary hamartomas. Suggest liver staging with MRI. Electronically Signed   By: Monte Fantasia M.D.   On: 11/27/2015 15:09   Dg Chest Port 1 View  Result Date: 11/29/2015 CLINICAL DATA:  Shortness of breath. EXAM: PORTABLE CHEST 1 VIEW COMPARISON:  08/08/2015. FINDINGS: Mediastinum and hilar structures are normal. Heart size normal. Mild bibasilar atelectasis and/or infiltrates. Biapical pleural-parenchymal thickening noted consistent with scarring. No pleural effusion or pneumothorax. IMPRESSION: 1. Mild right base subsegmental atelectasis. Mild right base infiltrate cannot be excluded. 2.  Biapical pleural-parenchymal scarring . Electronically Signed   By: Marcello Moores  Register   On: 11/29/2015 07:57   US Abdomen Limited Ruq  Result Date: 11/27/2015 CLINICAL DATA:  One day of abdominal pain. EXAM: US ABDOMEN LIMITED - RIGHT UPPER QUADRANT COMPARISON:  CT of earlier today. FINDINGS: Gallbladder: No gallstones or wall thickening  visualized. No sonographic Murphy sign noted by sonographer. Common bile duct: Diameter: Normal, 6 mm. Liver: 8.0 cm left hepatic lobe cyst, as detailed on CT. This has minimal complexity within. IMPRESSION: Normal gallbladder and biliary tree. Left hepatic lobe complex cyst. Electronically Signed   By: Abigail Miyamoto M.D.   On: 11/27/2015 16:11    Lab Data:  CBC:  Recent Labs Lab 11/27/15 1154 11/28/15 0505 11/29/15 0413 11/30/15 0639 12/01/15 0605  WBC 24.5* 17.8* 15.8* 19.1* 16.5*  HGB 12.1 11.3* 11.1* 11.1* 10.2*  HCT 38.3 36.3 35.8* 35.6* 32.9*  MCV 79.5 77.6* 78.5 79.8 77.6*  PLT 267 271 287 299 Q000111Q   Basic Metabolic Panel:  Recent Labs Lab 11/27/15 1154 11/28/15 0505  NA 138 135  K 3.3* 3.7  CL 104 103  CO2 26 23  GLUCOSE 128* 110*  BUN 22* 13  CREATININE 1.06* 0.90  CALCIUM 8.7* 8.0*   GFR: Estimated Creatinine Clearance: 52.4 mL/min (by C-G formula based on SCr of 0.9 mg/dL). Liver Function Tests:  Recent Labs Lab 11/27/15 1154  AST 19  ALT 26  ALKPHOS 92  BILITOT 0.5  PROT 6.9  ALBUMIN 3.4*    Recent Labs Lab 11/27/15 1154  LIPASE 19   No results for input(s): AMMONIA in the last 168 hours. Coagulation Profile: No results for input(s): INR, PROTIME in the last 168 hours. Cardiac Enzymes:  Recent Labs Lab 11/27/15 1245 11/29/15 0747  TROPONINI <0.03 <0.03   BNP (last 3 results) No results for input(s): PROBNP in the last 8760 hours. HbA1C: No results for input(s): HGBA1C in the last 72 hours. CBG: No results for input(s): GLUCAP in the last 168 hours. Lipid Profile: No results for input(s): CHOL, HDL, LDLCALC, TRIG, CHOLHDL, LDLDIRECT in the last 72 hours. Thyroid Function Tests: No results for input(s): TSH, T4TOTAL, FREET4, T3FREE, THYROIDAB in the last 72 hours. Anemia Panel:  Recent Labs  12/01/15 0605  FERRITIN 59  TIBC 319  IRON 10*   Urine analysis:    Component Value Date/Time   COLORURINE YELLOW 11/27/2015 [place] 11/27/2015 1513   LABSPEC <1.005 (L) 11/27/2015 1513   PHURINE 6.0 11/27/2015 1513   GLUCOSEU NEGATIVE 11/27/2015 1513   HGBUR MODERATE (A) 11/27/2015 1513   BILIRUBINUR NEGATIVE 11/27/2015 1513   KETONESUR NEGATIVE 11/27/2015 1513   PROTEINUR TRACE (A) 11/27/2015 1513   NITRITE NEGATIVE 11/27/2015 1513   LEUKOCYTESUR NEGATIVE 11/27/2015 1513     Terryn Rosenkranz M.D. [facility] 12/01/2015, 10:16 AM  Pager: [phone] Between 7am to 7pm - call Pager - [phone]  After 7pm go to [email or web address] -  password [email or web address]  Call night coverage person covering after 7pm

## 2015-12-01 NOTE — Progress Notes (Signed)
Patient Name: [name] Date of Encounter: 12/01/2015  Primary Cardiologist: Dr. Remi Deter Problem List     Principal Problem:   Pancreatic mass Active Problems:   Tobacco abuse   Atrial fibrillation (HCC)   Chronic obstructive pulmonary disease (COPD) (HCC)   Chronic anticoagulation-Xarelto   Hypokalemia   Leukocytosis   Epigastric pain     Subjective   Getting ready to go to CT.  Denies any further chest pain.  No SOB.  Inpatient Medications    Scheduled Meds: . benzonatate  100 mg Oral TID  . diltiazem  60 mg Oral Q6H  . doxycycline  100 mg Oral Q12H  . escitalopram  10 mg Oral QPM  . iopamidol      . ipratropium-albuterol  3 mL Inhalation TID  . metoprolol tartrate  37.5 mg Oral BID  . mometasone-formoterol  2 puff Inhalation BID  . nicotine  14 mg Transdermal Daily   Continuous Infusions: . heparin 1,300 Units/hr (12/01/15 0531)   PRN Meds: acetaminophen **OR** acetaminophen, albuterol, guaiFENesin-codeine, HYDROcodone-acetaminophen, metoprolol, metoprolol, morphine injection, ondansetron **OR** ondansetron (ZOFRAN) IV   Vital Signs    Vitals:   11/30/15 2012 11/30/15 2130 12/01/15 0518 12/01/15 0753  BP:  130/71 114/60   Pulse:  (!) 121 68   Resp:  (!) 22 18   Temp:  98.9 F (37.2 C) 98.5 F (36.9 C)   TempSrc:  Oral    SpO2: 91% 91% 95% 95%  Weight:      Height:        Intake/Output Summary (Last 24 hours) at 12/01/15 0942 Last data filed at 12/01/15 0616  Gross per 24 hour  Intake          1149.74 ml  Output              800 ml  Net           349.74 ml   Filed Weights   11/27/15 1130  Weight: 131 lb (59.4 kg)    Physical Exam    GEN: Well nourished, well developed, in no acute distress.  HEENT: Grossly normal.  Neck: Supple, no JVD, carotid bruits, or masses. Cardiac: RRR, no murmurs, rubs, or gallops. No clubbing, cyanosis, edema.  Radials/DP/PT 2+ and equal bilaterally.  Respiratory:  Respirations regular and  unlabored, clear to auscultation bilaterally. GI: Soft, nontender, nondistended, BS + x 4. MS: no deformity or atrophy. Skin: warm and dry, no rash. Neuro:  Strength and sensation are intact. Psych: AAOx3.  Normal affect.  Labs    CBC  Recent Labs  11/30/15 0639 12/01/15 0605  WBC 19.1* 16.5*  HGB 11.1* 10.2*  HCT 35.6* 32.9*  MCV 79.8 77.6*  PLT 299 Q000111Q   Basic Metabolic Panel No results for input(s): NA, K, CL, CO2, GLUCOSE, BUN, CREATININE, CALCIUM, MG, PHOS in the last 72 hours. Liver Function Tests No results for input(s): AST, ALT, ALKPHOS, BILITOT, PROT, ALBUMIN in the last 72 hours. No results for input(s): LIPASE, AMYLASE in the last 72 hours. Cardiac Enzymes  Recent Labs  11/29/15 0747  TROPONINI <0.03   BNP Invalid input(s): POCBNP D-Dimer  Recent Labs  11/29/15 0747  DDIMER 0.32   Hemoglobin A1C No results for input(s): HGBA1C in the last 72 hours. Fasting Lipid Panel No results for input(s): CHOL, HDL, LDLCALC, TRIG, CHOLHDL, LDLDIRECT in the last 72 hours. Thyroid Function Tests No results for input(s): TSH, T4TOTAL, T3FREE, THYROIDAB in the last 72 hours.  Invalid  input(s): FREET3  Telemetry     - Personally Reviewed  ECG     - Personally Reviewed and shows atrial fibrillation with CVR.  Radiology    No results found.  Cardiac Studies     Patient Profile     65 yo female with history of afib/aflutter with prior DCCV 07/2015, COPD, fibromyalgia, HTN. After DCCV she later went back into afib at f/u. In the past medication compliance has been an issue, including inhalers for her COPD and her diuterics. Medical costs have mainly limited her compliance.  Admit 08/2015 with GI bleed, EGD showed PUD, severe anemia reuqiring 2 units of PRBCs. She was eventually able to restart xarelto. Admit 11/27/15 with epigastric pain. CT imaging with evidence of pancreatic mass, concerning for malignancy. She is undergoing workup by primary team and GI.  Cardiology is consulted for alutter with RVR and chest pain  Assessment & Plan    1.  Pancreatic mass - workup per TRH 2.  Atrial fibrillation with CVR for the most part.  Continue Cardizem and Metoprolol.  Continue IV Heparin.  3.  COPD - per TRH 4.  Chest pain - symptoms seem atypical and she is very vague in her description.  Trop negative.  She seems more to complain of palpitations than CP.  No CAD by cath in 2006.  Signed, Fransico Him, MD  12/01/2015, 9:42 AM

## 2015-12-02 DIAGNOSIS — Z720 Tobacco use: Secondary | ICD-10-CM

## 2015-12-02 LAB — CBC
HCT: 32.2 % — ABNORMAL LOW (ref 36.0–46.0)
HEMOGLOBIN: 10.1 g/dL — AB (ref 12.0–15.0)
MCH: 24.9 pg — AB (ref 26.0–34.0)
MCHC: 31.4 g/dL (ref 30.0–36.0)
MCV: 79.5 fL (ref 78.0–100.0)
Platelets: 340 10*3/uL (ref 150–400)
RBC: 4.05 MIL/uL (ref 3.87–5.11)
RDW: 18.7 % — ABNORMAL HIGH (ref 11.5–15.5)
WBC: 13.8 10*3/uL — ABNORMAL HIGH (ref 4.0–10.5)

## 2015-12-02 MED ORDER — DILTIAZEM HCL ER COATED BEADS 240 MG PO CP24
240.0000 mg | ORAL_CAPSULE | Freq: Every day | ORAL | Status: DC
  Administered 2015-12-02 – 2015-12-03 (×2): 240 mg via ORAL
  Filled 2015-12-02 (×2): qty 1

## 2015-12-02 NOTE — Progress Notes (Signed)
[facility]                                                                              Patient Demographics  Lindsay Salazar, is a 65 y.o. female, DOB - 1950/07/21, [id number]  Admit date - 11/27/2015   Admitting Physician Karmen Bongo, MD  Outpatient Primary MD for the patient is [name], PA-C  Outpatient specialists:   LOS - 5  days    Chief Complaint  Patient presents with  . Abdominal Pain       Brief summary  65 year old female with history of GERD, hypertension, fibromyalgia, COPD on intermittent home O2, active tobacco use and A. fib on anticoagulation presented to [facility] ED with burning epigastric pain. She was hospitalized in July this year for peptic ulcer disease. Patient reports feeling dizzy intermittently. She is unsure about weight loss and reports that she has not been eating well due to financial crisis and not having enough food at home. In the ED she was in A. fib with occasional RVR. She had leukocytosis and mildly tachypneic meeting SIRS criteria. Blood work showed normal hemoglobin, begin of 22 and creatinine of 1. CT of the abdomen and pelvis showed a hypoechogenic mass in the pancreatic body with Dr. obstruction suggestive of getting adenocarcinoma.Transferred to [facility] for [facility] with FNA.   Assessment & Plan     Pancreatic mass/Adenocarcinoma -MRI abdomen for further evaluation. CA [id number] -EUS with FNA biopsy done on 10/26, results pending, preliminary cytology per EUS report likely adenocarcinoma - Appreciate general surgery and oncology recommendations, - CT chest CT with patchy bilateral lower lobe opacities reflecting atelectasis, possible superimposed right lower lobe pneumonia, small bilateral pleural effusions. Mild mediastinal lymphadenopathy possibly reactive, recommended PET CT  - Outpatient follow-up with Dr. Barry Dienes.  - Also discussed with Dr. Alvy Bimler, who will coordinate follow-up as patient lives in  [place] and has transportation issues. Further treatment options will be coordinated by surgery and oncology.    Tobacco abuse Counseled on cessation.  COPD with Acute bronchitis, possible right base infiltrate/pneumonia - Improving, continue doxycycline, Tessalon, Robitussin - Counseled on smoking cessation, continue nebs    Atrial fibrillation (HCC) with RVR -Heart rate controlled, Cardizem and metoprolol increased  -Transitioned to long-acting Cardizem today 240 mg daily, continue  metoprolol  -  Restarted xarelto  Poor home living situation. Patient reported that she is very poor and unable to afford food and neither has running water at home. Social work consulted to provide necessary support.  Code Status: Full CODE STATUS  DVT Prophylaxis:   heparin Family Communication: Discussed in detail with the patient, all imaging results, lab results explained to the patient   Disposition Plan: possible dc in am. Patient reports that she does not have gas money today so nobody can pick her up, she will arrange transportation for tomorrow morning.  Time Spent in minutes 25 minutes  Procedures:    Consultants:   Gastroenterology Oncology General surgery  Antimicrobials:   Doxycycline 10/27   Medications  Scheduled Meds: . benzonatate  100 mg Oral TID  . diltiazem  60 mg Oral Q6H  .  doxycycline  100 mg Oral Q12H  . escitalopram  10 mg Oral QPM  . ipratropium-albuterol  3 mL Inhalation TID  . metoprolol tartrate  37.5 mg Oral BID  . mometasone-formoterol  2 puff Inhalation BID  . nicotine  14 mg Transdermal Daily  . rivaroxaban  20 mg Oral Q supper   Continuous Infusions:   PRN Meds:.acetaminophen **OR** acetaminophen, albuterol, guaiFENesin-codeine, HYDROcodone-acetaminophen, metoprolol, metoprolol, morphine injection, ondansetron **OR** ondansetron (ZOFRAN) IV   Antibiotics   Anti-infectives    Start     Dose/Rate Route Frequency Ordered Stop   11/30/15  1000  doxycycline (VIBRA-TABS) tablet 100 mg     100 mg Oral Every 12 hours 11/30/15 0748          Subjective:   Lindsay Salazar was seen and examined today. Overall improving, ambulating in the room. Cough is improving. No fevers.  Patient denies dizziness, abdominal pain, N/V/D/C, new weakness, numbess, tingling.   Objective:   Vitals:   12/01/15 2116 12/02/15 0049 12/02/15 0505 12/02/15 0813  BP: (!) 117/58 126/75 128/73   Pulse: 88 77 (!) 143   Resp: 19  19   Temp: 98.5 F (36.9 C)  98.4 F (36.9 C)   TempSrc:      SpO2: 97%  92% 94%  Weight:      Height:        Intake/Output Summary (Last 24 hours) at 12/02/15 1050 Last data filed at 12/02/15 0555  Gross per 24 hour  Intake              240 ml  Output             1500 ml  Net            -1260 ml     Wt Readings from Last 3 Encounters:  11/27/15 59.4 kg (131 lb)  10/24/15 58.5 kg (129 lb)  10/10/15 59.8 kg (131 lb 12.8 oz)     Exam  General: Alert and oriented x 3, NAD  HEENT:    Neck: Supple, no JVD  Cardiovascular: S1 S2 auscultated,irregular,  no rubs, murmurs or gallops  Respiratory: Decreased breath sounds at the bases with scattered rhonchi  Gastrointestinal: NT, ND, NBS  Ext: no cyanosis clubbing or edema  Neuro: No new deficits  Skin: No rashes  Psych: Normal affect and demeanor, alert and oriented x3    Data Reviewed:  I have personally reviewed following labs and imaging studies  Micro Results No results found for this or any previous visit (from the past 240 hour(s)).  Radiology Reports Ct Chest W Contrast  Result Date: 12/01/2015 CLINICAL DATA:  Chest/abdominal pain, pancreatic cancer EXAM: CT CHEST WITH CONTRAST TECHNIQUE: Multidetector CT imaging of the chest was performed during intravenous contrast administration. CONTRAST:  69mL ISOVUE-300 IOPAMIDOL (ISOVUE-300) INJECTION 61% COMPARISON:  Partial comparison to CT abdomen pelvis dated 11/27/2015. FINDINGS: Cardiovascular: Heart  is top-normal in size. Left atrial enlargement. No pericardial effusion. Atherosclerotic calcifications of the aortic arch. Mediastinum/Nodes: Mild mediastinal lymphadenopathy, including a 12 mm short axis low right paratracheal node (series 201/ image 20) and a 13 mm short axis subcarinal node (series 201/ image 29). 7 mm partially calcified right hilar node and 8 mm short axis left hilar node, within normal limits. Visualized thyroid is unremarkable. Lungs/Pleura: Biapical pleural-parenchymal scarring, right greater than left, with associated right apical calcification. Moderate centrilobular emphysematous changes, upper lobe predominant. Patchy bilateral lower lobe opacities, right greater than left, likely reflecting atelectasis, although superimposed  right lower lobe pneumonia is not excluded. Small bilateral pleural effusions. No suspicious pulmonary nodules. No pneumothorax. Upper Abdomen: Visualized upper abdomen is unchanged from recent CT, noting a dominant left hepatic lobe cystic and an incompletely visualized mass in the pancreatic tail (series 201/ image 102). Musculoskeletal: Mild degenerative changes of the upper thoracic spine. IMPRESSION: Patchy bilateral lower lobe opacities, likely reflecting atelectasis, superimposed right lower lobe pneumonia not excluded. Small bilateral pleural effusions. Mild mediastinal lymphadenopathy, possibly reactive. Given the abdominal findings, nodal metastases are not excluded. Consider PET-CT for staging as clinically warranted. Electronically Signed   By: Julian Hy M.D.   On: 12/01/2015 11:16   Mr Abdomen W Wo Contrast  Result Date: 11/28/2015 CLINICAL DATA:  65 year old female inpatient with abdominal pain and newly diagnosed pancreatic body mass on a CT study from 1 day prior. EXAM: MRI ABDOMEN WITHOUT AND WITH CONTRAST TECHNIQUE: Multiplanar multisequence MR imaging of the abdomen was performed both before and after the administration of  intravenous contrast. CONTRAST:  41mL MULTIHANCE GADOBENATE DIMEGLUMINE 529 MG/ML IV SOLN COMPARISON:  11/27/2015 CT abdomen/pelvis. FINDINGS: Many of the sequences are motion degraded. Lower chest: Faint tiny clustered nodular opacities in the anterior left lower lobe (series 1404/ image 45), correlating with mild tree-in-bud opacities on the CT study from 1 day prior. Hepatobiliary: Normal liver size and configuration. Mild diffuse hepatic steatosis. There is a simple 6.7 x 6.5 cm posterior left liver lobe cyst. There is a simple 1.1 cm segment 6 right liver lobe cyst. There are innumerable (> than 30) subcentimeter T2 hyperintense nonenhancing lesions scattered throughout the liver most consistent with biliary hamartomas. No suspicious liver masses. Normal gallbladder with no cholelithiasis. No biliary ductal dilatation. Common bile duct diameter 5 mm. No choledocholithiasis. Pancreas: There is an approximately 5.0 x 2.3 x 2.6 cm distal pancreatic body mass (series 8/ image 20), which demonstrates ill-defined infiltrative appearing margins and heterogeneous hypoenhancement, most consistent with a primary pancreatic adenocarcinoma. There is marked dilatation of the distal main pancreatic duct within the atrophic pancreatic tail (9 mm diameter). This pancreatic mass appears to encase the splenic artery, which remains patent. This pancreatic mass appears to abut the anterior aspect of the splenic vein, which is mildly narrowed and remains patent. This pancreatic mass appears to involve a small portion of the main portal vein wall at the portosplenic confluence, approximately 20-30% of the main portal vein circumference. Main portal vein remains normal in caliber and patent. Superior mesenteric artery appears uninvolved. No pancreas divisum. Spleen: Normal size. No mass. Adrenals/Urinary Tract: Normal adrenals. No hydronephrosis. Simple 1.7 cm renal cyst in the posterior lower right kidney. Small simple parapelvic  renal cysts in the right kidney. No suspicious renal masses. Stomach/Bowel: Grossly normal stomach. Visualized small and large bowel is normal caliber, with no bowel wall thickening. Vascular/Lymphatic: Atherosclerotic nonaneurysmal abdominal aorta. Patent hepatic, left and right portal and renal veins. No pathologically enlarged lymph nodes in the abdomen. Other: No abdominal ascites or focal fluid collection. Musculoskeletal: No aggressive appearing focal osseous lesions. IMPRESSION: 1. Infiltrative hypoenhancing 5.0 x 2.3 x 2.6 cm distal pancreatic body mass, most consistent with a primary pancreatic adenocarcinoma. Atrophy and prominent pancreatic duct dilation in the pancreatic tail. Recommend correlation with endoscopic ultrasound with fine-needle aspiration. 2. Vascular involvement by the pancreatic mass as follows: Encasement of the patent splenic artery. Mild narrowing and abutment of the splenic vein, which remains patent. Mild involvement of the main portal vein wall at the portosplenic confluence. Portal vein remains  patent and normal in caliber. 3. No evidence of metastatic disease in the abdomen. 4. Faint tiny clustered nodular opacities in the anterior left lower lobe, correlating with mild tree-in-bud opacities on the CT study from 1 day prior, indicating a mild infectious or inflammatory bronchiolitis, with the differential including mild aspiration. 5. Additional findings include aortic atherosclerosis, mild diffuse hepatic steatosis and benign liver cysts and hamartomas. Electronically Signed   By: Ilona Sorrel M.D.   On: 11/28/2015 13:25   Ct Abdomen Pelvis W Contrast  Result Date: 11/27/2015 CLINICAL DATA:  Abdominal pain.  History of GI bleeding. EXAM: CT ABDOMEN AND PELVIS WITH CONTRAST TECHNIQUE: Multidetector CT imaging of the abdomen and pelvis was performed using the standard protocol following bolus administration of intravenous contrast. CONTRAST:  100 cc Isovue-300 intravenous  COMPARISON:  None. FINDINGS: Lower chest:  No nodules. Hepatobiliary: Simple appearing cysts in the inferior left liver measuring nearly 7 cm. Numerous other low-density areas are well-defined and likely cysts or hamartomas, but limited by small size. No definitive liver metastasis. Pancreas: 32 mm long, 25 mm thick hypoenhancing mass in the pancreatic body with ductal obstruction and parenchymal atrophy seen at the level of the tail. On sagittal reformats the mass encases the mid splenic artery and splenic vein with mild splenic vein narrowing 1-2 cm beyond the portal vein confluence. No infiltration seen around the celiac axis or SMA. No peripancreatic lymph nodes. Spleen: Unremarkable. Adrenals/Urinary Tract: Negative adrenals. No hydronephrosis or stone. Unremarkable bladder. Stomach/Bowel:  No obstruction. No appendicitis. Vascular/Lymphatic: No acute vascular abnormality. Splenic vascular findings described on pancreas section. Atherosclerosis. No mass or adenopathy. Reproductive:No pathologic findings. Other: No ascites or peritoneal nodules. Musculoskeletal: No acute or aggressive finding IMPRESSION: Findings of pancreas body adenocarcinoma as described above. Recommend EUS/FNA. No definitive metastasis, but limited in the liver due to numerous sub-centimeter cysts/biliary hamartomas. Suggest liver staging with MRI. Electronically Signed   By: Monte Fantasia M.D.   On: 11/27/2015 15:09   Dg Chest Port 1 View  Result Date: 11/29/2015 CLINICAL DATA:  Shortness of breath. EXAM: PORTABLE CHEST 1 VIEW COMPARISON:  08/08/2015. FINDINGS: Mediastinum and hilar structures are normal. Heart size normal. Mild bibasilar atelectasis and/or infiltrates. Biapical pleural-parenchymal thickening noted consistent with scarring. No pleural effusion or pneumothorax. IMPRESSION: 1. Mild right base subsegmental atelectasis. Mild right base infiltrate cannot be excluded. 2.  Biapical pleural-parenchymal scarring .  Electronically Signed   By: Marcello Moores  Register   On: 11/29/2015 07:57   US Abdomen Limited Ruq  Result Date: 11/27/2015 CLINICAL DATA:  One day of abdominal pain. EXAM: US ABDOMEN LIMITED - RIGHT UPPER QUADRANT COMPARISON:  CT of earlier today. FINDINGS: Gallbladder: No gallstones or wall thickening visualized. No sonographic Murphy sign noted by sonographer. Common bile duct: Diameter: Normal, 6 mm. Liver: 8.0 cm left hepatic lobe cyst, as detailed on CT. This has minimal complexity within. IMPRESSION: Normal gallbladder and biliary tree. Left hepatic lobe complex cyst. Electronically Signed   By: Abigail Miyamoto M.D.   On: 11/27/2015 16:11    Lab Data:  CBC:  Recent Labs Lab 11/28/15 0505 11/29/15 0413 11/30/15 [id number] 12/01/15 0605 12/02/15 0523  WBC 17.8* 15.8* 19.1* 16.5* 13.8*  HGB 11.3* 11.1* 11.1* 10.2* 10.1*  HCT 36.3 35.8* 35.6* 32.9* 32.2*  MCV 77.6* 78.5 79.8 77.6* 79.5  PLT 271 287 299 286 [id number]   Basic Metabolic Panel:  Recent Labs Lab 11/27/15 1154 11/28/15 0505  NA 138 135  K 3.3* 3.7  CL 104 103  CO2 26 23  GLUCOSE 128* 110*  BUN 22* 13  CREATININE 1.06* 0.90  CALCIUM 8.7* 8.0*   GFR: Estimated Creatinine Clearance: 52.4 mL/min (by C-G formula based on SCr of 0.9 mg/dL). Liver Function Tests:  Recent Labs Lab 11/27/15 1154  AST 19  ALT 26  ALKPHOS 92  BILITOT 0.5  PROT 6.9  ALBUMIN 3.4*    Recent Labs Lab 11/27/15 1154  LIPASE 19   No results for input(s): AMMONIA in the last 168 hours. Coagulation Profile: No results for input(s): INR, PROTIME in the last 168 hours. Cardiac Enzymes:  Recent Labs Lab 11/27/15 1245 11/29/15 0747  TROPONINI <0.03 <0.03   BNP (last 3 results) No results for input(s): PROBNP in the last 8760 hours. HbA1C: No results for input(s): HGBA1C in the last 72 hours. CBG: No results for input(s): GLUCAP in the last 168 hours. Lipid Profile: No results for input(s): CHOL, HDL, LDLCALC, TRIG, CHOLHDL, LDLDIRECT in  the last 72 hours. Thyroid Function Tests: No results for input(s): TSH, T4TOTAL, FREET4, T3FREE, THYROIDAB in the last 72 hours. Anemia Panel:  Recent Labs  12/01/15 0605  FERRITIN 59  TIBC 319  IRON 10*   Urine analysis:    Component Value Date/Time   COLORURINE YELLOW 11/27/2015 [place] 11/27/2015 1513   LABSPEC <1.005 (L) 11/27/2015 1513   PHURINE 6.0 11/27/2015 1513   GLUCOSEU NEGATIVE 11/27/2015 1513   HGBUR MODERATE (A) 11/27/2015 1513   BILIRUBINUR NEGATIVE 11/27/2015 1513   KETONESUR NEGATIVE 11/27/2015 1513   PROTEINUR TRACE (A) 11/27/2015 1513   NITRITE NEGATIVE 11/27/2015 1513   LEUKOCYTESUR NEGATIVE 11/27/2015 1513     Sofia Jaquith M.D. [facility] 12/02/2015, 10:50 AM  Pager: [phone] Between 7am to 7pm - call Pager - [phone]  After 7pm go to [email or web address] - password [email or web address]  Call night coverage person covering after 7pm

## 2015-12-02 NOTE — Discharge Instructions (Signed)

## 2015-12-02 NOTE — Progress Notes (Signed)
Patient ID: Lindsay Salazar, female   DOB: [birth year], 65 y.o.   MRN: [id number]   Stable from our standpoint, no changes. Now on Xarelto.  HR controlled in atrial fibrillation.  Note plan for discharge in morning.  Will need cardiology followup.   Loralie Champagne 12/02/2015 1:51 PM

## 2015-12-02 NOTE — Progress Notes (Signed)
ANTICOAGULATION CONSULT NOTE - [place] for Xarelto  Indication: atrial fibrillation  Allergies  Allergen Reactions  . Ibuprofen Nausea And Vomiting  . Penicillins Swelling    Has patient had a PCN reaction causing immediate rash, facial/tongue/throat swelling, SOB or lightheadedness with hypotension: Yes Has patient had a PCN reaction causing severe rash involving mucus membranes or skin necrosis: No Has patient had a PCN reaction that required hospitalization No Has patient had a PCN reaction occurring within the last 10 years: No If all of the above answers are "NO", then may proceed with Cephalosporin use.   . Tiotropium Bromide Monohydrate Itching  . Tramadol Nausea And Vomiting  . Vicodin [Hydrocodone-Acetaminophen] Nausea And Vomiting    Patient Measurements: Height: 5\' 4"  (162.6 cm) Weight: 131 lb (59.4 kg) IBW/kg (Calculated) : 54.7   Vital Signs: Temp: 98.4 F (36.9 C) (10/29 0505) BP: 128/73 (10/29 0505) Pulse Rate: 143 (10/29 0505)  Labs:  Recent Labs  11/30/15 [id number] 11/30/15 1607 11/30/15 2313 12/01/15 0605 12/01/15 0710 12/02/15 0523  HGB 11.1*  --   --  10.2*  --  10.1*  HCT 35.6*  --   --  32.9*  --  32.2*  PLT 299  --   --  286  --  340  HEPARINUNFRC  --  0.16* 0.20*  --  0.29*  --     Estimated Creatinine Clearance: 52.4 mL/min (by C-G formula based on SCr of 0.9 mg/dL).   Medical History: Past Medical History:  Diagnosis Date  . Anginal pain ([place])    thought she was having   heartburn  . Arthritis    bursitis , fibromyalgia  . Arthritis    "right hip" (11/27/2015)  . Asthma   . Atrial fibrillation ([place])   . Chronic bronchitis ([place])   . Chronic right hip pain   . COPD (chronic obstructive pulmonary disease) ([place])    wears home o2 prn  . Daily headache    "last 2-3 months" (11/27/2015)  . Exposure to TB    "mom had it when she was pregnant with me"  . Fibromyalgia   . GERD (gastroesophageal reflux  disease)    use to have it but not now  . Heart murmur    41-  64 years old  . Hypertension   . On home oxygen therapy    11/27/2015 "whenever I need it; don't know how many liters"  . Pneumonia    "several times" (11/27/2015)  . Pneumothorax       Assessment: 65 yo who was on Xarelto PTA for non-valvular atrial fibrillation. Xarelto was being held for work- up of a pancreatic mass and patient was transitioned to heparin. Xarelto re-started 10/28.  CBC is stable. Renal function is borderline at ~52 ml/min.      Plan:  Xarelto 20 mg once a day with evening meal Monitor renal function, CBC, signs/symptoms of bleeding  Dose adjust as appropriate - Re-check BMET in [place], PharmD PGY1 Pharmacy Resident Pager: [phone] 12/02/2015,10:49 AM

## 2015-12-03 LAB — BASIC METABOLIC PANEL
Anion gap: 7 (ref 5–15)
BUN: 17 mg/dL (ref 6–20)
CO2: 26 mmol/L (ref 22–32)
Calcium: 8.7 mg/dL — ABNORMAL LOW (ref 8.9–10.3)
Chloride: 105 mmol/L (ref 101–111)
Creatinine, Ser: 0.92 mg/dL (ref 0.44–1.00)
GFR calc Af Amer: >60 mL/min (ref >60)
GLUCOSE: 110 mg/dL — AB (ref 65–99)
POTASSIUM: 4.2 mmol/L (ref 3.5–5.1)
Sodium: 138 mmol/L (ref 135–145)

## 2015-12-03 LAB — CBC
HCT: 30.4 % — ABNORMAL LOW (ref 36.0–46.0)
Hemoglobin: 9.5 g/dL — ABNORMAL LOW (ref 12.0–15.0)
MCH: 24.4 pg — AB (ref 26.0–34.0)
MCHC: 31.3 g/dL (ref 30.0–36.0)
MCV: 78.1 fL (ref 78.0–100.0)
PLATELETS: 326 10*3/uL (ref 150–400)
RBC: 3.89 MIL/uL (ref 3.87–5.11)
RDW: 18.3 % — AB (ref 11.5–15.5)
WBC: 10.8 10*3/uL — ABNORMAL HIGH (ref 4.0–10.5)

## 2015-12-03 MED ORDER — DILTIAZEM HCL ER COATED BEADS 240 MG PO CP24: 240.0000 mg | ORAL_CAPSULE | Freq: Every day | ORAL | 4 refills | Status: DC

## 2015-12-03 MED ORDER — SYMBICORT 160-4.5 MCG/ACT IN AERO: 1.0000 | INHALATION_SPRAY | Freq: Two times a day (BID) | RESPIRATORY_TRACT | 12 refills | Status: AC

## 2015-12-03 MED ORDER — PANTOPRAZOLE SODIUM 40 MG PO TBEC: 40.0000 mg | DELAYED_RELEASE_TABLET | Freq: Every day | ORAL | 3 refills | Status: AC

## 2015-12-03 MED ORDER — DOXYCYCLINE HYCLATE 100 MG PO TABS: 100.0000 mg | ORAL_TABLET | Freq: Two times a day (BID) | ORAL | 0 refills | Status: DC

## 2015-12-03 MED ORDER — METOPROLOL TARTRATE 37.5 MG PO TABS: 37.5000 mg | ORAL_TABLET | Freq: Two times a day (BID) | ORAL | 4 refills | Status: DC

## 2015-12-03 MED ORDER — BENZONATATE 100 MG PO CAPS: 100.0000 mg | ORAL_CAPSULE | Freq: Three times a day (TID) | ORAL | 0 refills | Status: DC

## 2015-12-03 MED ORDER — IPRATROPIUM-ALBUTEROL 0.5-2.5 (3) MG/3ML IN SOLN: 3.0000 mL | Freq: Four times a day (QID) | RESPIRATORY_TRACT | 3 refills | Status: DC | PRN

## 2015-12-03 MED ORDER — ALBUTEROL SULFATE (2.5 MG/3ML) 0.083% IN NEBU: 2.5000 mg | INHALATION_SOLUTION | Freq: Four times a day (QID) | RESPIRATORY_TRACT | 12 refills | Status: DC | PRN

## 2015-12-03 MED ORDER — ESCITALOPRAM OXALATE 10 MG PO TABS: 10.0000 mg | ORAL_TABLET | Freq: Every evening | ORAL | 0 refills | Status: DC

## 2015-12-03 MED ORDER — NICOTINE 14 MG/24HR TD PT24: 14.0000 mg | MEDICATED_PATCH | Freq: Every day | TRANSDERMAL | 3 refills | Status: DC

## 2015-12-03 MED ORDER — COMBIVENT RESPIMAT 20-100 MCG/ACT IN AERS: 1.0000 | INHALATION_SPRAY | Freq: Two times a day (BID) | RESPIRATORY_TRACT | 5 refills | Status: DC

## 2015-12-03 NOTE — Progress Notes (Signed)
Lindsay Salazar   DOB:1950-10-12   [id number]    Subjective: She has intermittent epigastric discomfort. Denies chest pain or shortness of breath. The patient denies any recent signs or symptoms of bleeding such as spontaneous epistaxis, hematuria or hematochezia.   Objective:  Vitals:   12/03/15 0621 12/03/15 1052  BP: 126/61 134/76  Pulse: 64   Resp: 17   Temp: 98.2 F (36.8 C)      Intake/Output Summary (Last 24 hours) at 12/03/15 1323 Last data filed at 12/02/15 1837  Gross per 24 hour  Intake              360 ml  Output                0 ml  Net              360 ml    GENERAL:alert, no distress and comfortable Musculoskeletal:no cyanosis of digits and no clubbing  NEURO: alert & oriented x 3 with fluent speech, no focal motor/sensory deficits   Labs:  Lab Results  Component Value Date   WBC 10.8 (H) 12/03/2015   HGB 9.5 (L) 12/03/2015   HCT 30.4 (L) 12/03/2015   MCV 78.1 12/03/2015   PLT 326 12/03/2015   NEUTROABS 15.1 (H) 08/09/2015    Lab Results  Component Value Date   NA 138 12/03/2015   K 4.2 12/03/2015   CL 105 12/03/2015   CO2 26 12/03/2015   I reviewed recent CT scan of the chest. There is no conclusive evidence to suggest metastatic cancer to the lung Cytology come from adenocarcinoma of the pancreas  Assessment & Plan:   Locally advanced pancreatic ca, T2N0M0 She had borderline resectability based on partial portal vein involvement. However, I am concerned about her comorbidities including uncontrolled atrial fibrillation I recommend general surgery consultation for discussion about resectability She may be a candidate for neoadjuvant treatment followed by surgery I will get her case presented at the GI tumor board next week The patient has difficulty for transportation for chemotherapy here in [place]  I will refer her to be treated at [facility]  Atrial fibrillation Defer to cardiologist for further management  Microcytic  anemia She will need chronic iron therapy   Nicotine dependency The patient is motivated to quit smoking. She is on nicotine patch  Discharge planning I will set up referral to be treated locally. The plan would be several cycles of chemotherapy followed by repeat staging CT scan before possible surgical resection  Will sign off  Heath Lark, MD 12/03/2015  1:23 PM

## 2015-12-03 NOTE — Discharge Summary (Signed)
Physician Discharge Summary   Patient ID: Lindsay Salazar MRN: [id number] DOB/AGE: 67/06/1950 67 y.o.  Admit date: 11/27/2015 Discharge date: 12/03/2015  Primary Care Physician:  [name], PA-C  Discharge Diagnoses:    Pancreatic mass/ adenocarcinoma- new diagnosis . Atrial fibrillation (HCC)With RVR . Chronic obstructive pulmonary disease (COPD) ([place]) . Tobacco abuse . Acute bronchitis with Possible right base infiltrate/pneumonia . Hypokalemia . Leukocytosis   Consults:   Gastroenterology- Dr Loletha Carrow  Oncology- Dr Alvy Bimler Cardiology- Dr Geoffery Spruce. Surgery - Dr Georgette Dover  Recommendations for Outpatient Follow-up:  1. Please repeat CBC/BMET at next visit   DIET: Heart healthy diet    Allergies:   Allergies  Allergen Reactions  . Ibuprofen Nausea And Vomiting  . Penicillins Swelling    Has patient had a PCN reaction causing immediate rash, facial/tongue/throat swelling, SOB or lightheadedness with hypotension: Yes Has patient had a PCN reaction causing severe rash involving mucus membranes or skin necrosis: No Has patient had a PCN reaction that required hospitalization No Has patient had a PCN reaction occurring within the last 10 years: No If all of the above answers are "NO", then may proceed with Cephalosporin use.   . Tiotropium Bromide Monohydrate Itching  . Tramadol Nausea And Vomiting  . Vicodin [Hydrocodone-Acetaminophen] Nausea And Vomiting     DISCHARGE MEDICATIONS: Current Discharge Medication List    START taking these medications   Details  benzonatate (TESSALON) 100 MG capsule Take 1 capsule (100 mg total) by mouth 3 (three) times daily. Qty: 30 capsule, Refills: 0    doxycycline (VIBRA-TABS) 100 MG tablet Take 1 tablet (100 mg total) by mouth 2 (two) times daily. X 7days Qty: 14 tablet, Refills: 0    nicotine (NICODERM CQ - DOSED IN MG/24 HOURS) 14 mg/24hr patch Place 1 patch (14 mg total) onto the skin daily. Qty: 28 patch, Refills: 3       CONTINUE these medications which have CHANGED   Details  albuterol (PROVENTIL) (2.5 MG/3ML) 0.083% nebulizer solution Take 3 mLs (2.5 mg total) by nebulization every 6 (six) hours as needed for wheezing or shortness of breath. Qty: 75 mL, Refills: 12    COMBIVENT RESPIMAT 20-100 MCG/ACT AERS respimat Inhale 1 puff into the lungs 2 (two) times daily. Qty: 1 Inhaler, Refills: 5    diltiazem (CARDIZEM CD) 240 MG 24 hr capsule Take 1 capsule (240 mg total) by mouth daily. Qty: 30 capsule, Refills: 4    escitalopram (LEXAPRO) 10 MG tablet Take 1 tablet (10 mg total) by mouth every evening. Qty: 30 tablet, Refills: 0    ipratropium-albuterol (DUONEB) 0.5-2.5 (3) MG/3ML SOLN Inhale 3 mLs into the lungs every 6 (six) hours as needed (for shortness of breath). Qty: 360 mL, Refills: 3    metoprolol tartrate 37.5 MG TABS Take 37.5 mg by mouth 2 (two) times daily. Qty: 60 tablet, Refills: 4    pantoprazole (PROTONIX) 40 MG tablet Take 1 tablet (40 mg total) by mouth daily. Qty: 60 tablet, Refills: 3    SYMBICORT 160-4.5 MCG/ACT inhaler Inhale 1 puff into the lungs 2 (two) times daily. Qty: 1 Inhaler, Refills: 12      CONTINUE these medications which have NOT CHANGED   Details  HYDROcodone-acetaminophen (NORCO/VICODIN) 5-325 MG tablet Take 1 tablet by mouth every 6 (six) hours as needed for moderate pain (Must last 30 days.Do not take and drive a car or use machinery.). Qty: 100 tablet, Refills: 0    rivaroxaban (XARELTO) 20 MG TABS tablet Take  1 tablet (20 mg total) by mouth daily with supper. Qty: 30 tablet, Refills: 10    furosemide (LASIX) 20 MG tablet Take 1 tablet (20 mg total) by mouth daily as needed for edema. Qty: 90 tablet, Refills: 3         Brief H and P: For complete details please refer to admission H and P, but in brief 65 year old female with history of GERD, hypertension, fibromyalgia, COPD on intermittent home O2, active tobacco use and A. fib on  anticoagulation presented to [facility] ED with burning epigastric pain. She was hospitalized in July this year for peptic ulcer disease. Patient reports feeling dizzy intermittently. She is unsure about weight loss and reports that she has not been eating well due to financial crisis and not having enough food at home. In the ED she was in A. fib with occasional RVR. She had leukocytosis and mildly tachypneic meeting SIRS criteria. Blood work showed normal hemoglobin, begin of 22 and creatinine of 1. CT of the abdomen and pelvis showed a hypoechogenic mass in the pancreatic body with Dr. obstruction suggestive of getting adenocarcinoma.Transferred to [facility] for EUS with FNA.   Hospital Course:   Pancreatic mass/Adenocarcinoma- new diagnosis -MRI abdomen for further evaluation. CA [id number] -EUS with FNA biopsy done on 10/26, results pending, path report consistent with adenocarcinoma - Appreciate general surgery and oncology recommendations. - CT chest CT with patchy bilateral lower lobe opacities reflecting atelectasis, possible superimposed right lower lobe pneumonia, small bilateral pleural effusions. Mild mediastinal lymphadenopathy possibly reactive, recommended PET CT.  - Patient has been followed by oncology Dr. Alvy Bimler, who will coordinate follow-up at [facility] oncology as patient lives in [place] and has transportation issues. Further treatment options will be coordinated by surgery and oncology, per notes patient's case will presented in tumor board conference. Appointment with Dr Barry Dienes (surgery) has been scheduled.   Tobacco abuse Counseled on cessation, patient provided with prescriptions of nicotine patches.  COPD with Acute bronchitis, possible right base infiltrate/pneumonia - Improving, continue doxycycline, Tessalon, Robitussin - Counseled on smoking cessation, continue nebs, prescriptions for the inhalers given  Atrial fibrillation (HCC) with RVR - Heart rate now  controlled, Cardizem and metoprolol increased  -Transitioned to long-acting Cardizem today 240 mg daily, continue  metoprolol  -  Restarted xarelto  Poor home living situation. Patient reported that she is very poor and unable to afford food and neither has running water at home. Social work consulted to provide necessary support.   Day of Discharge BP 134/76   Pulse 64   Temp 98.2 F (36.8 C) (Oral)   Resp 17   Ht 5\' 4"  ([id number] m)   Wt 59.4 kg (131 lb)   SpO2 95%   BMI 22.49 kg/m   Physical Exam: General: Alert and awake oriented x3 not in any acute distress. HEENT: anicteric sclera, pupils reactive to light and accommodation CVS: S1-S2 clear no murmur rubs or gallops Chest: clear to auscultation bilaterally, no wheezing rales or rhonchi Abdomen: soft nontender, nondistended, normal bowel sounds Extremities: no cyanosis, clubbing or edema noted bilaterally Neuro: Cranial nerves II-XII intact, no focal neurological deficits   The results of significant diagnostics from this hospitalization (including imaging, microbiology, ancillary and laboratory) are listed below for reference.    LAB RESULTS: Basic Metabolic Panel:  Recent Labs Lab 11/28/15 0505 12/03/15 0558  NA 135 138  K 3.7 4.2  CL 103 105  CO2 23 26  GLUCOSE 110* 110*  BUN 13 17  CREATININE 0.90 0.92  CALCIUM 8.0* 8.7*   Liver Function Tests:  Recent Labs Lab 11/27/15 1154  AST 19  ALT 26  ALKPHOS 92  BILITOT 0.5  PROT 6.9  ALBUMIN 3.4*    Recent Labs Lab 11/27/15 1154  LIPASE 19   No results for input(s): AMMONIA in the last 168 hours. CBC:  Recent Labs Lab 12/02/15 0523 12/03/15 0558  WBC 13.8* 10.8*  HGB 10.1* 9.5*  HCT 32.2* 30.4*  MCV 79.5 78.1  PLT 340 326   Cardiac Enzymes:  Recent Labs Lab 11/27/15 1245 11/29/15 0747  TROPONINI <0.03 <0.03   BNP: Invalid input(s): POCBNP CBG: No results for input(s): GLUCAP in the last 168 hours.  Significant Diagnostic  Studies:  Mr Abdomen W Wo Contrast  Result Date: 11/28/2015 CLINICAL DATA:  65 year old female inpatient with abdominal pain and newly diagnosed pancreatic body mass on a CT study from 1 day prior. EXAM: MRI ABDOMEN WITHOUT AND WITH CONTRAST TECHNIQUE: Multiplanar multisequence MR imaging of the abdomen was performed both before and after the administration of intravenous contrast. CONTRAST:  13mL MULTIHANCE GADOBENATE DIMEGLUMINE 529 MG/ML IV SOLN COMPARISON:  11/27/2015 CT abdomen/pelvis. FINDINGS: Many of the sequences are motion degraded. Lower chest: Faint tiny clustered nodular opacities in the anterior left lower lobe (series 1404/ image 45), correlating with mild tree-in-bud opacities on the CT study from 1 day prior. Hepatobiliary: Normal liver size and configuration. Mild diffuse hepatic steatosis. There is a simple 6.7 x 6.5 cm posterior left liver lobe cyst. There is a simple 1.1 cm segment 6 right liver lobe cyst. There are innumerable (> than 30) subcentimeter T2 hyperintense nonenhancing lesions scattered throughout the liver most consistent with biliary hamartomas. No suspicious liver masses. Normal gallbladder with no cholelithiasis. No biliary ductal dilatation. Common bile duct diameter 5 mm. No choledocholithiasis. Pancreas: There is an approximately 5.0 x 2.3 x 2.6 cm distal pancreatic body mass (series 8/ image 20), which demonstrates ill-defined infiltrative appearing margins and heterogeneous hypoenhancement, most consistent with a primary pancreatic adenocarcinoma. There is marked dilatation of the distal main pancreatic duct within the atrophic pancreatic tail (9 mm diameter). This pancreatic mass appears to encase the splenic artery, which remains patent. This pancreatic mass appears to abut the anterior aspect of the splenic vein, which is mildly narrowed and remains patent. This pancreatic mass appears to involve a small portion of the main portal vein wall at the portosplenic  confluence, approximately 20-30% of the main portal vein circumference. Main portal vein remains normal in caliber and patent. Superior mesenteric artery appears uninvolved. No pancreas divisum. Spleen: Normal size. No mass. Adrenals/Urinary Tract: Normal adrenals. No hydronephrosis. Simple 1.7 cm renal cyst in the posterior lower right kidney. Small simple parapelvic renal cysts in the right kidney. No suspicious renal masses. Stomach/Bowel: Grossly normal stomach. Visualized small and large bowel is normal caliber, with no bowel wall thickening. Vascular/Lymphatic: Atherosclerotic nonaneurysmal abdominal aorta. Patent hepatic, left and right portal and renal veins. No pathologically enlarged lymph nodes in the abdomen. Other: No abdominal ascites or focal fluid collection. Musculoskeletal: No aggressive appearing focal osseous lesions. IMPRESSION: 1. Infiltrative hypoenhancing 5.0 x 2.3 x 2.6 cm distal pancreatic body mass, most consistent with a primary pancreatic adenocarcinoma. Atrophy and prominent pancreatic duct dilation in the pancreatic tail. Recommend correlation with endoscopic ultrasound with fine-needle aspiration. 2. Vascular involvement by the pancreatic mass as follows: Encasement of the patent splenic artery. Mild narrowing and abutment of the splenic vein, which remains patent. Mild involvement  of the main portal vein wall at the portosplenic confluence. Portal vein remains patent and normal in caliber. 3. No evidence of metastatic disease in the abdomen. 4. Faint tiny clustered nodular opacities in the anterior left lower lobe, correlating with mild tree-in-bud opacities on the CT study from 1 day prior, indicating a mild infectious or inflammatory bronchiolitis, with the differential including mild aspiration. 5. Additional findings include aortic atherosclerosis, mild diffuse hepatic steatosis and benign liver cysts and hamartomas. Electronically Signed   By: Ilona Sorrel M.D.   On: 11/28/2015  13:25   Ct Abdomen Pelvis W Contrast  Result Date: 11/27/2015 CLINICAL DATA:  Abdominal pain.  History of GI bleeding. EXAM: CT ABDOMEN AND PELVIS WITH CONTRAST TECHNIQUE: Multidetector CT imaging of the abdomen and pelvis was performed using the standard protocol following bolus administration of intravenous contrast. CONTRAST:  100 cc Isovue-300 intravenous COMPARISON:  None. FINDINGS: Lower chest:  No nodules. Hepatobiliary: Simple appearing cysts in the inferior left liver measuring nearly 7 cm. Numerous other low-density areas are well-defined and likely cysts or hamartomas, but limited by small size. No definitive liver metastasis. Pancreas: 32 mm long, 25 mm thick hypoenhancing mass in the pancreatic body with ductal obstruction and parenchymal atrophy seen at the level of the tail. On sagittal reformats the mass encases the mid splenic artery and splenic vein with mild splenic vein narrowing 1-2 cm beyond the portal vein confluence. No infiltration seen around the celiac axis or SMA. No peripancreatic lymph nodes. Spleen: Unremarkable. Adrenals/Urinary Tract: Negative adrenals. No hydronephrosis or stone. Unremarkable bladder. Stomach/Bowel:  No obstruction. No appendicitis. Vascular/Lymphatic: No acute vascular abnormality. Splenic vascular findings described on pancreas section. Atherosclerosis. No mass or adenopathy. Reproductive:No pathologic findings. Other: No ascites or peritoneal nodules. Musculoskeletal: No acute or aggressive finding IMPRESSION: Findings of pancreas body adenocarcinoma as described above. Recommend EUS/FNA. No definitive metastasis, but limited in the liver due to numerous sub-centimeter cysts/biliary hamartomas. Suggest liver staging with MRI. Electronically Signed   By: Monte Fantasia M.D.   On: 11/27/2015 15:09   US Abdomen Limited Ruq  Result Date: 11/27/2015 CLINICAL DATA:  One day of abdominal pain. EXAM: US ABDOMEN LIMITED - RIGHT UPPER QUADRANT COMPARISON:  CT of  earlier today. FINDINGS: Gallbladder: No gallstones or wall thickening visualized. No sonographic Murphy sign noted by sonographer. Common bile duct: Diameter: Normal, 6 mm. Liver: 8.0 cm left hepatic lobe cyst, as detailed on CT. This has minimal complexity within. IMPRESSION: Normal gallbladder and biliary tree. Left hepatic lobe complex cyst. Electronically Signed   By: Abigail Miyamoto M.D.   On: 11/27/2015 16:11    2D ECHO: Study Conclusions  - Left ventricle: The cavity size was normal. Wall thickness was   normal. Systolic function was normal. The estimated ejection   fraction was 55%. There is akinesis of the midanteroseptal   myocardium. The study is not technically sufficient to allow   evaluation of LV diastolic function. - Aortic valve: Mildly calcified annulus. Trileaflet. There was   trivial regurgitation. - Mitral valve: Mildly thickened leaflets . There was mild   regurgitation. - Left atrium: The atrium was mildly dilated. - Right ventricle: Systolic function was low normal. - Right atrium: The atrium was mildly dilated. Central venous   pressure (est): 8 mm Hg. - Tricuspid valve: There was moderate regurgitation. - Pulmonary arteries: PA peak pressure: 41 mm Hg (S). - Pericardium, extracardiac: There was no pericardial effusion.  Impressions:  - Normal LV wall thickness with LVEF approximately  55%. There is   akinesis of the mid anteroseptal wall. Indeterminate diastolic   function in the setting of atrial fibrillation/flutter. Mild   biatrial enlargement. Mildly thickened mitral leaflets with mild   mitral regurgitation. Trivial aortic regurgitation. Low normal RV   contraction. Moderate tricuspid regurgitation with PASP 41 mmHg.   Disposition and Follow-up:    DISPOSITION: home    DISCHARGE FOLLOW-UP Follow-up Information    BAUCOM, JENNY B, PA-C. Schedule an appointment as soon as possible for a visit in 2 week(s).   Specialty:  Physician  Assistant Contact information: [address] [place] [address] [phone]        Stark Klein, MD Follow up on 12/14/2015.   Specialty:  General Surgery Why:  APPT on Friday 12/14/2015 @ 0830a. Please bring insurance card, ID and co-pay if required.  Contact information: [address] [place] [address] [phone]        [name], MD Follow up on 12/18/2015.   Specialty:  Cardiology Why:  APPT on  Tuesday 12/18/2015 @ 330p Contact information: [address] [place] [address] [phone]            Time spent on Discharge: 47mins   Signed:   Khyla Mccumbers M.D. [facility] 12/03/2015, 11:52 AM Pager: [phone]

## 2015-12-03 NOTE — Care Management Important Message (Signed)
Important Message  Patient Details  Name: Lindsay Salazar MRN: [id number] Date of Birth: 05-05-1950   Medicare Important Message Given:  Yes    Lindsay Salazar 12/03/2015, 5:20 PM

## 2015-12-06 ENCOUNTER — Encounter (HOSPITAL_COMMUNITY): Payer: Medicare HMO | Attending: Hematology & Oncology | Admitting: Hematology & Oncology

## 2015-12-06 ENCOUNTER — Encounter (HOSPITAL_COMMUNITY): Payer: Self-pay | Admitting: Hematology & Oncology

## 2015-12-06 VITALS — BP 112/45 | HR 76 | Temp 97.6°F | Resp 18 | Ht 63.75 in | Wt 132.4 lb

## 2015-12-06 DIAGNOSIS — C252 Malignant neoplasm of tail of pancreas: Secondary | ICD-10-CM

## 2015-12-06 DIAGNOSIS — I4891 Unspecified atrial fibrillation: Secondary | ICD-10-CM

## 2015-12-06 DIAGNOSIS — C251 Malignant neoplasm of body of pancreas: Secondary | ICD-10-CM | POA: Diagnosis not present

## 2015-12-06 DIAGNOSIS — Z720 Tobacco use: Secondary | ICD-10-CM

## 2015-12-06 DIAGNOSIS — D509 Iron deficiency anemia, unspecified: Secondary | ICD-10-CM | POA: Diagnosis not present

## 2015-12-06 DIAGNOSIS — Z7901 Long term (current) use of anticoagulants: Secondary | ICD-10-CM

## 2015-12-06 DIAGNOSIS — K921 Melena: Secondary | ICD-10-CM

## 2015-12-06 DIAGNOSIS — D500 Iron deficiency anemia secondary to blood loss (chronic): Secondary | ICD-10-CM

## 2015-12-06 DIAGNOSIS — I480 Paroxysmal atrial fibrillation: Secondary | ICD-10-CM | POA: Diagnosis not present

## 2015-12-06 NOTE — Patient Instructions (Signed)
[facility] at [facility] Discharge Instructions  RECOMMENDATIONS MADE BY THE CONSULTANT AND ANY TEST RESULTS WILL BE SENT TO YOUR REFERRING PHYSICIAN.  You saw Dr.Penland today. See Amy at checkout for appointments.  Thank you for choosing [facility] at [facility] to provide your oncology and hematology care.  To afford each patient quality time with our provider, please arrive at least 15 minutes before your scheduled appointment time.   Beginning January 23rd 2017 lab work for the Cancer Center will be done in the  Main lab at [facility] on 1st floor. If you have a lab appointment with the Cancer Center please come in thru the  Main Entrance and check in at the main information desk  You need to re-schedule your appointment should you arrive 10 or more minutes late.  We strive to give you quality time with our providers, and arriving late affects you and other patients whose appointments are after yours.  Also, if you no show three or more times for appointments you may be dismissed from the clinic at the providers discretion.     Again, thank you for choosing [facility].  Our hope is that these requests will decrease the amount of time that you wait before being seen by our physicians.       _____________________________________________________________  Should you have questions after your visit to [facility], please contact our office at [phone] between the hours of 8:30 a.m. and 4:30 p.m.  Voicemails left after 4:30 p.m. will not be returned until the following business day.  For prescription refill requests, have your pharmacy contact our office.         Resources For Cancer Patients and their Caregivers ? American Cancer Society: Can assist with transportation, wigs, general needs, runs Look Good Feel Better.        1-888-227-6333 ? Cancer Care: Provides financial assistance, online support  groups, medication/co-pay assistance.  1-800-813-HOPE (4673) ? [facility] Assists [place] cancer patients and their families through emotional , educational and financial support.  336-427-4357 ? [place] DSS Where to apply for food stamps, Medicaid and utility assistance. 336-342-1394 ? [facility]: Transportation to medical appointments. 336-347-2287 ? Social Security Administration: May apply for disability if have a Stage IV cancer. 336-342-7796 1-800-772-1213 ? [place] Aging, Disability and Transit Services: Assists with nutrition, care and transit needs. 336-349-2343  Cancer Center Support Programs: @10RELATIVEDAYS@ > Cancer Support Group  2nd Tuesday of the month 1pm-2pm, Journey Room  > Creative Journey  3rd Tuesday of the month 1130am-1pm, Journey Room  > Look Good Feel Better  1st Wednesday of the month 10am-12 noon, Journey Room (Call American Cancer Society to register 1-800-395-5775)    

## 2015-12-06 NOTE — Progress Notes (Signed)
Met with pt face to face.  Introduced myself and explain a little bit about my role as the patient navigator.  Pt given a card with all my information on it.  Told pt to call if they had any questions or concerns.  Pt verbalized understanding.   Set pt up for chemo teaching next Wednesday.  First chemotherapy appt 12/19/2015.  Will call pt and let her know when to stop the Xarelto prior to going to have the port placed.

## 2015-12-06 NOTE — Progress Notes (Signed)
[place]  CONSULT NOTE  Patient Care Team: [name], PA-C as PCP - General (Physician Assistant)  CHIEF COMPLAINTS/PURPOSE OF CONSULTATION:  Pancreatic cancer, borderline resectable  HISTORY OF PRESENTING ILLNESS:  Lindsay Salazar 65 y.o. female is here for a consultation due to a pancreatic mass and was referred by Dr. Alvy Bimler.  Lindsay Salazar was transferred from [facility] on 10/24 after significant sensation of gastritis/epigastric burning. Patient has a history of GI bleeds secondary to NSAID ingestion. When she presented to ED, CT scans revealed a mass in the pancreatic body encasing regional blood vessels and multiple liver cysts but MRI excluded metastatic disease to liver. However a small portion of the portal vein is involved. Lindsay Salazar underwent upper endoscopy evaluation, EUS and preliminary biopsy confirmed adenocarcinoma. No regional adenopathy found. Patient denies any pain, but stated she had intermittent sensation of palpitations from atrial fibrillation.   Patient denied epistaxis, hematuria, hematochezia, change in bowel habits, nausea, or weight loss. She does experience abdominal pain at times.  Patient was referred to [facility] because it is less of a commute. She is here today for further evaluation.   Lindsay Salazar is unaccompanied today. She states she sleeps 50-75% of the day. She experiences fatigue after standing for fifteen minutes. Lexapro has alleviated deppresive symptoms. Lindsay Salazar is terrified that she has cancer and says she hopes she caught the cancer in time.  She had a bleeding ulcer in July. She has a history of H.Pylori.  Lindsay Salazar has a boyfriend who does the housework and cooking. His brother also lives with them.  Blanchard Mane, PA-C is her PCP. She sees Dr. Harl Bowie for heart issues. She was evaluated by Dr. Luna Glasgow for right hip pain.  Patient has had flu shot. She gets yearly mammograms.   Her daughter is Lindsay Salazar. Her home number is [phone]. Her cell  number is [phone].  Patient was presented at GI tumor board and chemotherapy was recommended in the neoadjuvant setting in attempts to make her a surgical candidate. She will be seen by Dr. Barry Dienes at [place].   MEDICAL HISTORY:  Past Medical History:  Diagnosis Date  . Anginal pain ([place])    thought she was having   heartburn  . Arthritis    bursitis , fibromyalgia  . Arthritis    "right hip" (11/27/2015)  . Asthma   . Atrial fibrillation ([place])   . Chronic bronchitis ([place])   . Chronic right hip pain   . COPD (chronic obstructive pulmonary disease) ([place])    wears home o2 prn  . Daily headache    "last 2-3 months" (11/27/2015)  . Exposure to TB    "mom had it when she was pregnant with me"  . Fibromyalgia   . GERD (gastroesophageal reflux disease)    use to have it but not now  . Heart murmur    18-  56 years old  . Hypertension   . On home oxygen therapy    11/27/2015 "whenever I need it; don't know how many liters"  . Pancreatic cancer ([place])   . Pneumonia    "several times" (11/27/2015)  . Pneumothorax     SURGICAL HISTORY: Past Surgical History:  Procedure Laterality Date  . ANTERIOR CERVICAL DECOMP/DISCECTOMY FUSION     "put 4 screws in"  . BACK SURGERY    . CARDIAC CATHETERIZATION  08/2004   Archie Endo 06/18/2010  . CARDIOVERSION N/A 07/18/2015   Procedure: CARDIOVERSION;  Surgeon: Josue Hector, MD;  Location: [facility];  Service: Cardiovascular;  Laterality: N/A;  . ESOPHAGOGASTRODUODENOSCOPY N/A 08/09/2015   Procedure: ESOPHAGOGASTRODUODENOSCOPY (EGD);  Surgeon: Rogene Houston, MD;  Location: [facility] ENDO SUITE;  Service: Endoscopy;  Laterality: N/A;  . EUS N/A 11/29/2015   Procedure: UPPER ENDOSCOPIC ULTRASOUND (EUS) RADIAL;  Surgeon: Milus Banister, MD;  Location: [facility] ENDOSCOPY;  Service: Endoscopy;  Laterality: N/A;  . IR GENERIC HISTORICAL  12/18/2015   IR US GUIDE VASC ACCESS RIGHT 12/18/2015 Sandi Mariscal, MD [facility]-INTER[facility] RAD  . IR GENERIC HISTORICAL  12/18/2015    IR FLUORO GUIDE PORT INSERTION RIGHT 12/18/2015 Sandi Mariscal, MD [facility]  . TONSILLECTOMY    . TUBAL LIGATION      SOCIAL HISTORY: Social History   Social History  . Marital status: Divorced    Spouse name: N/A  . Number of children: N/A  . Years of education: N/A   Occupational History  . retired    Social History Main Topics  . Smoking status: Former Smoker    Packs/day: 0.50    Years: 46.00    Types: Cigarettes    Start date: 10/09/1969    Quit date: 11/29/2015  . Smokeless tobacco: Never Used  . Alcohol use No     Comment: quit 30 years ago  . Drug use: No  . Sexual activity: Not Currently    Birth control/ protection: Surgical, Post-menopausal   Other Topics Concern  . Not on file   Social History Narrative  . No narrative on file  Born in [place] 1 daughter; 4 grandchildren Has a son but per her daughter has not spoke with him in some time Quit smoking last Monday Worked in [place] and Lindsey Only child Has 88 dogs  FAMILY HISTORY: Family History  Problem Relation Age of Onset  . COPD Mother   . Diabetes Father   . Stroke Father   Mom -emphysema- died at 34 Dad- diabetic  ALLERGIES:  is allergic to ibuprofen; penicillins; tiotropium bromide monohydrate; and tramadol.  MEDICATIONS:  Current Outpatient Prescriptions  Medication Sig Dispense Refill  . albuterol (PROVENTIL) (2.5 MG/3ML) 0.083% nebulizer solution Take 3 mLs (2.5 mg total) by nebulization every 6 (six) hours as needed for wheezing or shortness of breath. 75 mL 12  . benzonatate (TESSALON) 100 MG capsule Take 1 capsule (100 mg total) by mouth 3 (three) times daily. 30 capsule 0  . COMBIVENT RESPIMAT 20-100 MCG/ACT AERS respimat Inhale 1 puff into the lungs 2 (two) times daily. 1 Inhaler 5  . diltiazem (CARDIZEM CD) 240 MG 24 hr capsule Take 1 capsule (240 mg total) by mouth daily. 30 capsule 4  . escitalopram (LEXAPRO) 10 MG tablet Take 1 tablet (10 mg total) by mouth every evening.  30 tablet 0  . furosemide (LASIX) 20 MG tablet Take 1 tablet (20 mg total) by mouth daily as needed for edema. (Patient taking differently: Take 20 mg by mouth daily as needed for fluid or edema. ) 90 tablet 3  . ipratropium-albuterol (DUONEB) 0.5-2.5 (3) MG/3ML SOLN Inhale 3 mLs into the lungs every 6 (six) hours as needed (for shortness of breath). 360 mL 3  . metoprolol tartrate 37.5 MG TABS Take 37.5 mg by mouth 2 (two) times daily. 60 tablet 4  . nicotine (NICODERM CQ - DOSED IN MG/24 HOURS) 14 mg/24hr patch Place 1 patch (14 mg total) onto the skin daily. 28 patch 3  . pantoprazole (PROTONIX) 40 MG tablet Take 1 tablet (40 mg total) by  mouth daily. 60 tablet 3  . rivaroxaban (XARELTO) 20 MG TABS tablet Take 1 tablet (20 mg total) by mouth daily with supper. 30 tablet 10  . SYMBICORT 160-4.5 MCG/ACT inhaler Inhale 1 puff into the lungs 2 (two) times daily. 1 Inhaler 12  . azithromycin (ZITHROMAX) 250 MG tablet As directed 6 each 0  . Gemcitabine HCl (GEMZAR IV) Inject into the vein. Day 1, day 8, day 15, every 28 days    . HYDROcodone-acetaminophen (NORCO/VICODIN) 5-325 MG tablet Take 1 tablet by mouth every 6 (six) hours as needed for moderate pain (Must last 30 days.Do not take and drive a car or use machinery.). 85 tablet 0  . lidocaine-prilocaine (EMLA) cream Apply to affected area once 30 g 3  . ondansetron (ZOFRAN) 8 MG tablet Take 1 tablet (8 mg total) by mouth 2 (two) times daily as needed (Nausea or vomiting). 30 tablet 1  . PACLitaxel Protein-Bound Part (ABRAXANE IV) Inject into the vein. Day 1, day 8, day 15, every 28 days    . potassium chloride SA (K-DUR,KLOR-CON) 20 MEQ tablet Take 2 tablets (40 mEq total) by mouth 2 (two) times daily. 120 tablet 2  . prochlorperazine (COMPAZINE) 10 MG tablet Take 1 tablet (10 mg total) by mouth every 6 (six) hours as needed (Nausea or vomiting). 30 tablet 1   No current facility-administered medications for this visit.     Review of Systems    Constitutional: Positive for malaise/fatigue.       "gives out easily"  HENT: Negative.   Eyes: Negative.   Respiratory: Negative.   Cardiovascular: Negative.   Gastrointestinal: Positive for abdominal pain.  Genitourinary: Negative.   Musculoskeletal: Negative.   Skin: Negative.   Neurological: Negative.   Endo/Heme/Allergies: Negative.   Psychiatric/Behavioral: Positive for depression.       Managed with Lexapro  All other systems reviewed and are negative.  14 point ROS was done and is otherwise as detailed above or in HPI    PHYSICAL EXAMINATION: ECOG PERFORMANCE STATUS: 1 - Symptomatic but completely ambulatory  Vitals:   12/06/15 1725  BP: (!) 112/45  Pulse: 76  Resp: 18  Temp: 97.6 F (36.4 C)   Filed Weights   12/06/15 1725  Weight: 132 lb 6.4 oz (60.1 kg)    Physical Exam  Constitutional: She is oriented to person, place, and time and well-developed, well-nourished, and in no distress.  Patient with strong physical odor  HENT:  Head: Normocephalic and atraumatic.  Mouth/Throat: Oropharynx is clear and moist. No oropharyngeal exudate.  Eyes: Conjunctivae and EOM are normal. Pupils are equal, round, and reactive to light. No scleral icterus.  Neck: Normal range of motion. Neck supple.  Cardiovascular: Normal rate, regular rhythm and normal heart sounds.   No murmur heard. Pulmonary/Chest: Effort normal and breath sounds normal. No respiratory distress. She has no wheezes.  Abdominal: Soft. Bowel sounds are normal. She exhibits no distension. There is no tenderness. There is no rebound and no guarding.  Musculoskeletal: Normal range of motion. She exhibits no edema.  Lymphadenopathy:    She has no cervical adenopathy.  Neurological: She is alert and oriented to person, place, and time. No cranial nerve deficit. Gait normal.  Skin: Skin is warm and dry.  Psychiatric: Mood, memory, affect and judgment normal.  Nursing note and vitals  reviewed.   LABORATORY DATA:  I have reviewed the data as listed Results for JANEVA, PEASTER (MRN [id number])   Ref. Range 12/03/2015 05:58  Sodium Latest Ref Range: 135 - 145 mmol/L 138  Potassium Latest Ref Range: 3.5 - 5.1 mmol/L 4.2  Chloride Latest Ref Range: 101 - 111 mmol/L 105  CO2 Latest Ref Range: 22 - 32 mmol/L 26  BUN Latest Ref Range: 6 - 20 mg/dL 17  Creatinine Latest Ref Range: 0.44 - 1.00 mg/dL 0.92  Calcium Latest Ref Range: 8.9 - 10.3 mg/dL 8.7 (L)  EGFR (Non-African Amer.) Latest Ref Range: >60 mL/min >60  EGFR (African American) Latest Ref Range: >60 mL/min >60  Glucose Latest Ref Range: 65 - 99 mg/dL 110 (H)  Anion gap Latest Ref Range: 5 - 15  7  WBC Latest Ref Range: 4.0 - 10.5 K/uL 10.8 (H)  RBC Latest Ref Range: 3.87 - 5.11 MIL/uL 3.89  Hemoglobin Latest Ref Range: 12.0 - 15.0 g/dL 9.5 (L)  HCT Latest Ref Range: 36.0 - 46.0 % 30.4 (L)  MCV Latest Ref Range: 78.0 - 100.0 fL 78.1  MCH Latest Ref Range: 26.0 - 34.0 pg 24.4 (L)  MCHC Latest Ref Range: 30.0 - 36.0 g/dL 31.3  RDW Latest Ref Range: 11.5 - 15.5 % 18.3 (H)  Platelets Latest Ref Range: 150 - 400 K/uL 326   Results for LILLEIGH, HECHAVARRIA (MRN [id number])   Ref. Range 12/01/2015 06:05  Iron Latest Ref Range: 28 - 170 ug/dL 10 (L)  UIBC Latest Units: ug/dL 309  TIBC Latest Ref Range: 250 - 450 ug/dL 319  Saturation Ratios Latest Ref Range: 10.4 - 31.8 % 3 (L)  Ferritin Latest Ref Range: 11 - 307 ng/mL 59   Results for MAPLE, ODANIEL (MRN [id number])   Ref. Range 11/29/2015 04:13  CA 19-9 Latest Ref Range: 0 - 35 U/mL 194 (H)    RADIOGRAPHIC STUDIES: I have personally reviewed the radiological images as listed and agreed with the findings in the report. No results found. Study Result   CLINICAL DATA:  Chest/abdominal pain, pancreatic cancer  EXAM: CT CHEST WITH CONTRAST  TECHNIQUE: Multidetector CT imaging of the chest was performed during intravenous contrast administration.  CONTRAST:  55m  ISOVUE-300 IOPAMIDOL (ISOVUE-300) INJECTION 61%  COMPARISON:  Partial comparison to CT abdomen pelvis dated 11/27/2015.  FINDINGS: Cardiovascular: Heart is top-normal in size. Left atrial enlargement. No pericardial effusion.  Atherosclerotic calcifications of the aortic arch.  Mediastinum/Nodes: Mild mediastinal lymphadenopathy, including a 12 mm short axis low right paratracheal node (series 201/ image 20) and a 13 mm short axis subcarinal node (series 201/ image 29).  7 mm partially calcified right hilar node and 8 mm short axis left hilar node, within normal limits.  Visualized thyroid is unremarkable.  Lungs/Pleura: Biapical pleural-parenchymal scarring, right greater than left, with associated right apical calcification.  Moderate centrilobular emphysematous changes, upper lobe predominant.  Patchy bilateral lower lobe opacities, right greater than left, likely reflecting atelectasis, although superimposed right lower lobe pneumonia is not excluded.  Small bilateral pleural effusions.  No suspicious pulmonary nodules.  No pneumothorax.  Upper Abdomen: Visualized upper abdomen is unchanged from recent CT, noting a dominant left hepatic lobe cystic and an incompletely visualized mass in the pancreatic tail (series 201/ image 102).  Musculoskeletal: Mild degenerative changes of the upper thoracic spine.  IMPRESSION: Patchy bilateral lower lobe opacities, likely reflecting atelectasis, superimposed right lower lobe pneumonia not excluded.  Small bilateral pleural effusions.  Mild mediastinal lymphadenopathy, possibly reactive. Given the abdominal findings, nodal metastases are not excluded. Consider PET-CT for staging as clinically warranted.   Electronically Signed   By:  Julian Hy M.D.   On: 12/01/2015 11:16    Study Result   CLINICAL DATA:  65 year old female inpatient with abdominal pain and newly diagnosed pancreatic body  mass on a CT study from 1 day prior.  EXAM: MRI ABDOMEN WITHOUT AND WITH CONTRAST  TECHNIQUE: Multiplanar multisequence MR imaging of the abdomen was performed both before and after the administration of intravenous contrast.  CONTRAST:  46m MULTIHANCE GADOBENATE DIMEGLUMINE 529 MG/ML IV SOLN  COMPARISON:  11/27/2015 CT abdomen/pelvis.  FINDINGS: Many of the sequences are motion degraded.  Lower chest: Faint tiny clustered nodular opacities in the anterior left lower lobe (series 1404/ image 45), correlating with mild tree-in-bud opacities on the CT study from 1 day prior.  Hepatobiliary: Normal liver size and configuration. Mild diffuse hepatic steatosis. There is a simple 6.7 x 6.5 cm posterior left liver lobe cyst. There is a simple 1.1 cm segment 6 right liver lobe cyst. There are innumerable (> than 30) subcentimeter T2 hyperintense nonenhancing lesions scattered throughout the liver most consistent with biliary hamartomas. No suspicious liver masses. Normal gallbladder with no cholelithiasis. No biliary ductal dilatation. Common bile duct diameter 5 mm. No choledocholithiasis.  Pancreas: There is an approximately 5.0 x 2.3 x 2.6 cm distal pancreatic body mass (series 8/ image 20), which demonstrates ill-defined infiltrative appearing margins and heterogeneous hypoenhancement, most consistent with a primary pancreatic adenocarcinoma. There is marked dilatation of the distal main pancreatic duct within the atrophic pancreatic tail (9 mm diameter). This pancreatic mass appears to encase the splenic artery, which remains patent. This pancreatic mass appears to abut the anterior aspect of the splenic vein, which is mildly narrowed and remains patent. This pancreatic mass appears to involve a small portion of the main portal vein wall at the portosplenic confluence, approximately 20-30% of the main portal vein circumference. Main portal vein remains normal in caliber  and patent. Superior mesenteric artery appears uninvolved. No pancreas divisum.  Spleen: Normal size. No mass.  Adrenals/Urinary Tract: Normal adrenals. No hydronephrosis. Simple 1.7 cm renal cyst in the posterior lower right kidney. Small simple parapelvic renal cysts in the right kidney. No suspicious renal masses.  Stomach/Bowel: Grossly normal stomach. Visualized small and large bowel is normal caliber, with no bowel wall thickening.  Vascular/Lymphatic: Atherosclerotic nonaneurysmal abdominal aorta. Patent hepatic, left and right portal and renal veins. No pathologically enlarged lymph nodes in the abdomen.  Other: No abdominal ascites or focal fluid collection.  Musculoskeletal: No aggressive appearing focal osseous lesions.  IMPRESSION: 1. Infiltrative hypoenhancing 5.0 x 2.3 x 2.6 cm distal pancreatic body mass, most consistent with a primary pancreatic adenocarcinoma. Atrophy and prominent pancreatic duct dilation in the pancreatic tail. Recommend correlation with endoscopic ultrasound with fine-needle aspiration. 2. Vascular involvement by the pancreatic mass as follows: Encasement of the patent splenic artery. Mild narrowing and abutment of the splenic vein, which remains patent. Mild involvement of the main portal vein wall at the portosplenic confluence. Portal vein remains patent and normal in caliber. 3. No evidence of metastatic disease in the abdomen. 4. Faint tiny clustered nodular opacities in the anterior left lower lobe, correlating with mild tree-in-bud opacities on the CT study from 1 day prior, indicating a mild infectious or inflammatory bronchiolitis, with the differential including mild aspiration. 5. Additional findings include aortic atherosclerosis, mild diffuse hepatic steatosis and benign liver cysts and hamartomas.   Electronically Signed   By: JIlona SorrelM.D.   On: 11/28/2015 13:25   PATHOLOGY:    ASSESSMENT &  PLAN:   Pancreatic cancer ([place])   Staging form: Pancreas, AJCC 7th Edition   - Clinical: Stage IB (T2, N0, M0) - Signed by Baird Cancer, PA-C on 01/01/2016 No problem-specific Assessment & Plan notes found for this encounter. Distal pancreatic adenocarcinoma Hx H. Pylori Hx GI bleed Paroxysmal afib Tobacco Abuse Iron deficiency Anemia PAF  We discussed her diagnosis in detail today. We discussed the recommendations of tumor board, the rationale behind neoadjuvant chemotherapy. I have recommended gemzar/abraxane to the patient. I do not feel that she is a good candidate for FOLFOXIRI.   Her social situation is somewhat difficult. Jaynie Collins spoke with her daughter today.   She will need port placement. Formal chemotherapy teaching will be arranged. She has met Anderson Malta our navigator today. We discussed living wills and [place].  CA 19-9 is elevated and will be followed. She has evidence of iron deficiency and can be given oral iron but may need IV iron ultimately. She will continue on XARELTO. She is followed by cardiology for a history of PAF.   Follow up with patient on 12/26/2015.  This document serves as a record of services personally performed by Ancil Linsey, MD. It was created on her behalf by Elmyra Ricks, a trained medical scribe. The creation of this record is based on the scribe's personal observations and the provider's statements to them. This document has been checked and approved by the attending provider.  I have reviewed the above documentation for accuracy and completeness and I agree with the above.  This note was electronically signed.    Molli Hazard, MD  01/13/2016 4:25 PM

## 2015-12-07 ENCOUNTER — Encounter (HOSPITAL_COMMUNITY): Payer: Self-pay | Admitting: *Deleted

## 2015-12-07 ENCOUNTER — Other Ambulatory Visit (HOSPITAL_COMMUNITY): Payer: Self-pay | Admitting: Hematology & Oncology

## 2015-12-07 ENCOUNTER — Telehealth (HOSPITAL_COMMUNITY): Payer: Self-pay | Admitting: Emergency Medicine

## 2015-12-07 DIAGNOSIS — C251 Malignant neoplasm of body of pancreas: Secondary | ICD-10-CM

## 2015-12-07 DIAGNOSIS — C259 Malignant neoplasm of pancreas, unspecified: Secondary | ICD-10-CM | POA: Insufficient documentation

## 2015-12-07 NOTE — Telephone Encounter (Signed)
Called Lindsay Salazar to let him know to hold Lindsay Salazar's Xarelto for a full day before the insertion of the port-a-cath.  Pt takes it at night.  I told her to stop taking it after she took her dose on Sunday 12/16/2015 for the evening, then after the procedure she could resume taking it as normal, pt verbalized understanding.

## 2015-12-07 NOTE — Care Management (Signed)
I called the pt this morning and asked her what exactly was broken about the shower and she gave the phone to her boyfriend because she was unsure. While I had him on the phone I asked him about heir living conditions and what was bad about their living conditions. He stated, " well it's just an old house", I asked if they had all the necessities like hot water, heat, etc. He stated that they have hot water and that they heat with plug in electric heaters because he is afraid to use the furnace. I asked Lindsay Salazar about their financial situation. He stated " I have no income, Lindsay Salazar gets $590 in disability and $165 in SSI, and $120 in food stamps". He stated that his brother pays $350 a month for rent, Lindsay Salazar pays $40 a month for water, $80 a month for car insurance, $400 a month for 3 loans that she has, and between $200-$400 a month for electric bill. He stated that they will also go to [facility] to Pay day advance and $350 and have to pay $400 back within 2 months after receiving the advance. Lindsay Salazar stated that Lindsay Salazar doesn't have to pay very much for her medications but every three months she buys his medications that are around $140. Lindsay Salazar stated that his nephew, Lindsay Salazar's daughter, and his sister help them at times with food when they run out of food stamps. He stated that he is pretty sure that they are in good standing with the [employer] but would have to check and see. I advised Lindsay Salazar that I would call and see if there was any assistance out there available for them.  I called DSS and was transferred to APS where I was told that there was really nothing out there to help them. Valerie at [place] stated that if an adult is in sound mind and can do for themselves then they are choosing to live the way that they do, and they could make changes if they wanted too. She also stated that there were currently no housing assistance at this time.

## 2015-12-07 NOTE — Patient Instructions (Addendum)
[place]   CHEMOTHERAPY INSTRUCTIONS  You have been diagnosed with Pancreatic Cancer.  You will have chemo for two months and then we will repeat CT scans. Dr. Barry Dienes will then determine if cancer is resectable.  Your treatment consists of gemzar and abraxane.  This treatment is with curative intent.  One cycle is 28 days (or 1 month).  You will be treated on Day 1, day 8, day 15 every 28 days.  You will complete two of these before we scan you again.  So you will be treated for 3 weeks in a row with a 1 week break.   You will see the doctor regularly throughout treatment.  We monitor your lab work prior to every treatment.  The doctor monitors your response to treatment by the way you are feeling, your blood work, and scans periodically. You will receive compazine, a nausea pill, as a pre-medication prior to starting your chemotherapy.    Abraxane - myelosuppression (bone marrow suppression - lowers white blood cells, red blood cells, and platelets), sensory neuropathy, muscle and joint aches/pain, nausea/vomiting, diarrhea, mucositis, hair loss )   (takes 30 minutes to infuse)  When To Contact Your Doctor or [place] Provider: Contact your health care provider immediately, day or night, if you should experience any of the following symptoms: Fever of 100.4 F (38 C) or higher, chills (possible signs of infection) The following symptoms require medical attention, but are not an emergency. Contact your health care provider within 24 hours of noticing any of the following: If you notice any redness or pain at the site of injection.  Nausea (interferes with ability to eat and unrelieved with prescribed medication).  Vomiting (vomiting more than 4-5 times in a 24 hour period).  Diarrhea (4-6 episodes in a 24-hour period).  Unusual bleeding or bruising  Black or tarry stools, or blood in your stools.  Extreme fatigue (unable to carry on self-care activities).  Mouth sores  (painful redness, swelling or ulcers).  Numbness and tingling of the hands and feet.  Signs of infection such as redness or swelling, pain on swallowing, coughing up mucous, or painful urination Always inform your health care provider if you experience any unusual symptoms.  Abraxane Self Care Tips: Abraxane, or the medications that you take with Abraxane may cause you to feel dizzy or drowsy. Do not operate any heavy machinery until you know how you respond to Abraxane.  If you notice any redness or pain at the injection site, place a warm compress, and notify your healthcare provider.  Drink at least two to three quarts of fluid every 24 hours, unless you are instructed otherwise.  You may be at risk of infection so try to avoid crowds or people with colds, and report fever or any other signs of infection immediately to your health care provider.  Wash your hands often.  To help treat/prevent mouth sores, use a soft toothbrush, and rinse three times a day with 1/2 to 1 teaspoon of baking soda and/or 1/2 to 1 teaspoon of salt mixed with 8 ounces of water.  Use an electric razor and a soft toothbrush to minimize bleeding.  Avoid contact sports or activities that could cause injury.  To reduce nausea, take anti-nausea medications as prescribed by your doctor, and eat small, frequent meals.  Acetaminophen or ibuprophen may help relieve discomfort from fever, headache and/or generalized aches and pains. However, be sure to talk with your doctor before taking it.  Avoid  sun exposure. Wear SPF 18 (or higher) sunblock and protective clothing.  In general, drinking alcoholic beverages should be kept to a minimum or avoided completely. You should discuss this with your doctor.  Get plenty of rest.  Maintain good nutrition  If you experience symptoms or side effects, be sure to discuss them with your health care team. They can prescribe medications and/or offer other suggestions that are effective in  managing such problems.    Gemcitabine (Gemzar) - bone marrow suppression (lowers white blood cells (fight infection), lowers red blood cells (make up your blood), lowers platelets (help blood to clot). Nausea/vomiting,fever, flu-like symptoms, rash. (takes 30 minutes to infuse)  When to contact your doctor or health care provider: Contact your health care provider immediately, day or night, if you should experience any of the following symptoms: Fever of 100.71F (38C) or higher, chills (possible signs of infection) The following symptoms require medical attention, but are not emergency situations. Contact your health care provider within 24 hours of noticing any of the following: Nausea that interferes with eating and is not relieved by medications prescribed by your doctor.  Vomiting (more than 4-5 episodes within a 24-hour period)  Extreme fatigue (inability to perform self-care activities)  Diarrhea (more than 4-6 episodes in a 24-hour period)  Unusual bleeding or bruising  Black or tarry stools, or blood in your stools or urine Always inform your health care provider if you experience any unusual symptoms.  Gemzar Self-Care Tips: For flu-like symptoms, keep warm with blankets and drink plenty of liquids.  Drink at least two to three quarts of fluid every 24 hours, unless you are instructed otherwise.  You may be at risk of infection so try to avoid crowds or people with colds, and report fever or any other signs of infection immediately to your health care provider.  Wash your hands often.  To help treat/prevent mouth sores, use a soft toothbrush, and rinse three times a day with 1/2 to 1 teaspoon of baking soda and/or salt mixed with 8 ounces of water.  Use an electric razor and a soft toothbrush to minimize bleeding.  Avoid contact sports or activities that could cause injury.  To reduce nausea, take anti-nausea medications as prescribed by your doctor, and eat small, frequent  meals.  Avoid sun exposure. Wear SPF 48 (or higher) sunblock and protective clothing.  In general, drinking alcoholic beverages should be kept to a minimum or avoided completely. You should discuss this with your doctor.  You may experience drowsiness or dizziness; avoid driving or engaging in tasks that require alertness until your response to Gemzar is known.  Get plenty of rest.  Maintain good nutrition.  If you experience symptoms or side effects, be sure to discuss them with your health care team. They can prescribe medications and/or offer other suggestions that are effective in managing such problems.   SELF IMAGE NEEDS AND REFERRALS MADE: Information on look good, feel better.   EDUCATIONAL MATERIALS GIVEN AND REVIEWED: Chemotherapy and you book, nutrition book, information on gemzar and abraxane.   SELF CARE ACTIVITIES WHILE ON CHEMOTHERAPY:  Hydration Increase your fluid intake 48 hours prior to treatment and drink at least 8 to 12 cups (64 ounces) of water/decaff beverages per day after treatment. You can still have your cup of coffee or soda but these beverages do not count as part of your 8 to 12 cups that you need to drink daily. No alcohol intake.  Medications Continue taking your normal  prescription medication as prescribed.  If you start any new herbal or new supplements please let us know first to make sure it is safe.  Mouth Care Have teeth cleaned professionally before starting treatment. Keep dentures and partial plates clean. Use soft toothbrush and do not use mouthwashes that contain alcohol. Biotene is a good mouthwash that is available at most pharmacies or may be ordered by calling [phone]. Use warm salt water gargles (1 teaspoon salt per 1 quart warm water) before and after meals and at bedtime. Or you may rinse with 2 tablespoons of three-percent hydrogen peroxide mixed in eight ounces of water. If you are still having problems with your mouth or sores in  your mouth please call the clinic. If you need dental work, please let Dr. Whitney Muse know before you go for your appointment so that we can coordinate the best possible time for you in regards to your chemo regimen. You need to also let your dentist know that you are actively taking chemo. We may need to do labs prior to your dental appointment.   Skin Care Always use sunscreen that has not expired and with SPF (Sun Protection Factor) of 50 or higher. Wear hats to protect your head from the sun. Remember to use sunscreen on your hands, ears, face, & feet.  Use good moisturizing lotions such as udder cream, eucerin, or even Vaseline. Some chemotherapies can cause dry skin, color changes in your skin and nails.    . Avoid long, hot showers or baths. . Use gentle, fragrance-free soaps and laundry detergent. . Use moisturizers, preferably creams or ointments rather than lotions because the thicker consistency is better at preventing skin dehydration. Apply the cream or ointment within 15 minutes of showering. Reapply moisturizer at night, and moisturize your hands every time after you wash them.  Hair Loss (if your doctor says your hair will fall out)  . If your doctor says that your hair is likely to fall out, decide before you begin chemo whether you want to wear a wig. You may want to shop before treatment to match your hair color. . Hats, turbans, and scarves can also camouflage hair loss, although some people prefer to leave their heads uncovered. If you go bare-headed outdoors, be sure to use sunscreen on your scalp. . Cut your hair short. It eases the inconvenience of shedding lots of hair, but it also can reduce the emotional impact of watching your hair fall out. . Don't perm or color your hair during chemotherapy. Those chemical treatments are already damaging to hair and can enhance hair loss. Once your chemo treatments are done and your hair has grown back, it's OK to resume dyeing or perming  hair. With chemotherapy, hair loss is almost always temporary. But when it grows back, it may be a different color or texture. In older adults who still had hair color before chemotherapy, the new growth may be completely gray.  Often, new hair is very fine and soft.  Infection Prevention Please wash your hands for at least 30 seconds using warm soapy water. Handwashing is the #1 way to prevent the spread of germs. Stay away from sick people or people who are getting over a cold. If you develop respiratory systems such as green/yellow mucus production or productive cough or persistent cough let us know and we will see if you need an antibiotic. It is a good idea to keep a pair of gloves on when going into grocery stores/[employer] to  decrease your risk of coming into contact with germs on the carts, etc. Carry alcohol hand gel with you at all times and use it frequently if out in public. If your temperature reaches 100.5 or higher please call the clinic and let us know.  If it is after hours or on the weekend please go to the ER if your temperature is over 100.5.  Please have your own personal thermometer at home to use.    Sex and bodily fluids If you are going to have sex, a condom must be used to protect the person that isn't taking chemotherapy. Chemo can decrease your libido (sex drive). For a few days after chemotherapy, chemotherapy can be excreted through your bodily fluids.  When using the toilet please close the lid and flush the toilet twice.  Do this for a few day after you have had chemotherapy.     Effects of chemotherapy on your sex life Some changes are simple and won't last long. They won't affect your sex life permanently. Sometimes you may feel: . too tired . not strong enough to be very active . sick or sore  . not in the mood . anxious or low Your anxiety might not seem related to sex. For example, you may be worried about the cancer and how your treatment is going. Or you may be  worried about money, or about how you family are coping with your illness. These things can cause stress, which can affect your interest in sex. It's important to talk to your partner about how you feel. Remember - the changes to your sex life don't usually last long. There's usually no medical reason to stop having sex during chemo. The drugs won't have any long term physical effects on your performance or enjoyment of sex. Cancer can't be passed on to your partner during sex  Contraception It's important to use reliable contraception during treatment. Avoid getting pregnant while you or your partner are having chemotherapy. This is because the drugs may harm the baby. Sometimes chemotherapy drugs can leave a man or woman infertile.  This means you would not be able to have children in the future. You might want to talk to someone about permanent infertility. It can be very difficult to learn that you may no longer be able to have children. Some people find counselling helpful. There might be ways to preserve your fertility, although this is easier for men than for women. You may want to speak to a fertility expert. You can talk about sperm banking or harvesting your eggs. You can also ask about other fertility options, such as donor eggs. If you have or have had breast cancer, your doctor might advise you not to take the contraceptive pill. This is because the hormones in it might affect the cancer.  It is not known for sure whether or not chemotherapy drugs can be passed on through semen or secretions from the vagina. Because of this some doctors advise people to use a barrier method if you have sex during treatment. This applies to vaginal, anal or oral sex. Generally, doctors advise a barrier method only for the time you are actually having the treatment and for about a week after your treatment. Advice like this can be worrying, but this does not mean that you have to avoid being intimate with your  partner. You can still have close contact with your partner and continue to enjoy sex.  Animals If you have cats or birds  we just ask that you not change the litter or change the cage.  Please have someone else do this for you while you are on chemotherapy.   Food Safety During and After Cancer Treatment Food safety is important for people both during and after cancer treatment. Cancer and cancer treatments, such as chemotherapy, radiation therapy, and stem cell/bone marrow transplantation, often weaken the immune system. This makes it harder for your body to protect itself from foodborne illness, also called food poisoning. Foodborne illness is caused by eating food that contains harmful bacteria, parasites, or viruses.  Foods to avoid Some foods have a higher risk of becoming tainted with bacteria. These include: Marland Kitchen Unwashed fresh fruit and vegetables, especially leafy vegetables that can hide dirt and other contaminants . Raw sprouts, such as alfalfa sprouts . Raw or undercooked beef, especially ground beef, or other raw or undercooked meat and poultry . Fatty, fried, or spicy foods immediately before or after treatment.  These can sit heavy on your stomach and make you feel nauseous. . Raw or undercooked shellfish, such as oysters. . Sushi and sashimi, which often contain raw fish.  . Unpasteurized beverages, such as unpasteurized fruit juices, raw milk, raw yogurt, or cider . Undercooked eggs, such as soft boiled, over easy, and poached; raw, unpasteurized eggs; or foods made with raw egg, such as homemade raw cookie dough and homemade mayonnaise  Simple steps for food safety  Shop smart. . Do not buy food stored or displayed in an unclean area. . Do not buy bruised or damaged fruits or vegetables. . Do not buy cans that have cracks, dents, or bulges. . Pick up foods that can spoil at the end of your shopping trip and store them in a cooler on the way home. Prepare and clean up foods  carefully. . Rinse all fresh fruits and vegetables under running water, and dry them with a clean towel or paper towel. . Clean the top of cans before opening them. . After preparing food, wash your hands for 20 seconds with hot water and soap. Pay special attention to areas between fingers and under nails. . Clean your utensils and dishes with hot water and soap. Marland Kitchen Disinfect your kitchen and cutting boards using 1 teaspoon of liquid, unscented bleach mixed into 1 quart of water.   Dispose of old food. . Eat canned and packaged food before its expiration date (the "use by" or "best before" date). . Consume refrigerated leftovers within 3 to 4 days. After that time, throw out the food. Even if the food does not smell or look spoiled, it still may be unsafe. Some bacteria, such as Listeria, can grow even on foods stored in the refrigerator if they are kept for too long. Take precautions when eating out. . At restaurants, avoid buffets and salad bars where food sits out for a long time and comes in contact with many people. Food can become contaminated when someone with a virus, often a norovirus, or another "bug" handles it. . Put any leftover food in a "to-go" container yourself, rather than having the server do it. And, refrigerate leftovers as soon as you get home. . Choose restaurants that are clean and that are willing to prepare your food as you order it cooked.    MEDICATIONS:  Zofran/Ondansetron 8mg  tablet. Take 1 tablet every 8 hours as needed for nausea/vomiting. (#1 nausea med to take, this can constipate)  Compazine/Prochlorperazine 10mg  tablet. Take 1 tablet every 6 hours as needed  for nausea/vomiting. (#2 nausea med to take, this can make you sleepy)   EMLA cream. Apply a quarter size amount to port site 1 hour prior to chemo. Do not rub in. Cover with plastic wrap.   Over-the-Counter Meds:  Miralax 1 capful in 8 oz of fluid daily. May increase to two times a day if needed.  This is a stool softener. If this doesn't work proceed you can add:  Senokot S-start with 1 tablet two times a day and increase to 4 tablets two times a day if needed. (total of 8 tablets in a 24 hour period). This is a stimulant laxative.   Call us if this does not help your bowels move.   Imodium 2mg  capsule. Take 2 capsules after the 1st loose stool and then 1 capsule every 2 hours until you go a total of 12 hours without having a loose stool. Call the [facility] if loose stools continue. If diarrhea occurs @ bedtime, take 2 capsules @ bedtime. Then take 2 capsules every 4 hours until morning. Call [facility].      Diarrhea Sheet  If you are having loose stools/diarrhea, please purchase Imodium and begin taking as outlined:  At the first sign of poorly formed or loose stools you should begin taking Imodium(loperamide) 2 mg capsules.  Take two caplets (4mg ) followed by one caplet (2mg ) every 2 hours until you have had no diarrhea for 12 hours.  During the night take two caplets (4mg ) at bedtime and continue every 4 hours during the night until the morning.  Stop taking Imodium only after there is no sign of diarrhea for 12 hours.    Always call the [facility] if you are having loose stools/diarrhea that you can't get under control.  Loose stools/disrrhea leads to dehydration (loss of water) in your body.  We have other options of trying to get the loose stools/diarrhea to stopped but you must let us know!      Constipation Sheet *Miralax in 8 oz of fluid daily.  May increase to two times a day if needed.  This is a stool softener.  If this not enough to keep your bowel regular:  You can add:  *Senokot S, start with one tablet twice a day and can increase to 4 tablets twice a day if needed.  This is a stimulant laxative.   Sometimes when you take pain medication you need BOTH a medicine to keep your stool soft and a medicine to help your bowel push it out!  Please call if the  above does not work for you.   Do not go more than 2 days without a bowel movement.  It is very important that you do not become constipated.  It will make you feel sick to your stomach (nausea) and can cause abdominal pain and vomiting.      Nausea Sheet  Zofran/Ondansetron 8mg  tablet. Take 1 tablet every 8 hours as needed for nausea/vomiting. (#1 nausea med to take, this can constipate)  Compazine/Prochlorperazine 10mg  tablet. Take 1 tablet every 6 hours as needed for nausea/vomiting. (#2 nausea med to take, this can make you sleepy)  You can take these medications together or separately.  We would first like for you to try the Ondansetron by itself and then take the Prochloperizine if needed. But you are allowed to take both medications at the same time if your nausea is that severe.  If you are having persistent nausea (nausea that does not stop)  please take these medications on a staggered schedule so that the nausea medication stays in your body.  Please call the [facility] and let us know the amount of nausea that you are experiencing.  If you begin to vomit, you need to call the [facility] and if it is the weekend and you have vomited more than one time and cant get it to stop-go to the Emergency Room.  Persistent nausea/vomiting can lead to dehydration (loss of fluid in your body) and will make you feel terrible.   Ice chips, sips of clear liquids, foods that are @ room temperature, crackers, and toast tend to be better tolerated.    (Please refer to/review other teaching materials that have been provided to you in this blue folder - What to Know During Chemo, What to know After Chemo, Dr. Donald Pore Advice, Constipation Sheet, Diarrhea Sheet, Nausea Sheet)    SYMPTOMS TO REPORT AS SOON AS POSSIBLE AFTER TREATMENT:  FEVER GREATER THAN 100.5 F  CHILLS WITH OR WITHOUT FEVER  NAUSEA AND VOMITING THAT IS NOT CONTROLLED WITH YOUR NAUSEA MEDICATION  UNUSUAL SHORTNESS OF  BREATH  UNUSUAL BRUISING OR BLEEDING  TENDERNESS IN MOUTH AND THROAT WITH OR WITHOUT PRESENCE OF ULCERS  URINARY PROBLEMS  BOWEL PROBLEMS  UNUSUAL RASH    Wear comfortable clothing and clothing appropriate for easy access to any Portacath or PICC line. Let us know if there is anything that we can do to make your therapy better!     What to do if you need assistance after hours or on the weekends: CALL [phone].  HOLD on the line, do not hang up.  You will hear multiple messages but at the end you will be connected with a nurse triage line.  They will contact Dr Whitney Muse if necessary.  Most of the time they will be able to assist you.    Do not call the hospital operator.  Dr Whitney Muse will not answer phone calls received by them.      I have been informed and understand all of the instructions given to me and have received a copy. I have been instructed to call the clinic [phone]  or my family physician as soon as possible for continued medical care, if indicated. I do not have any more questions at this time but understand that I may call the [facility] at [phone] during office hours should I have questions or need assistance in obtaining follow-up care.

## 2015-12-07 NOTE — Progress Notes (Signed)
START ON PATHWAY REGIMEN - Pancreatic  PANOS58: Nab-Paclitaxel (Abraxane) 125 mg/m2 D1, 8, 15 + Gemcitabine 1,000 mg/m2 D1, 8, 15 q28 Days x 2 Cycles   A cycle is every 28 days:     Nab-Paclitaxel (protein bound) (Abraxane(R)) 125 mg/m2 in NS to a concentration of 5 mg/mL given IV over 30 mins days 1, 8, and 15 followed by Dose Mod: None     Gemcitabine (Gemzar(R)) 1000 mg/m2 in 250 mL NS IV over 30 minutes days 1, 8, and 15. Dose Mod: None Additional Orders: Hold therapy and notify physician if [place] < 1500.  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 No AJCC T Stage: 2 AJCC N Stage: 0 AJCC Stage Grouping: IB AJCC M Stage: 0 Histology: Adenocarcinoma  Intent of Therapy: Curative Intent, Discussed with Patient

## 2015-12-10 MED ORDER — LIDOCAINE-PRILOCAINE 2.5-2.5 % EX CREA: TOPICAL_CREAM | CUTANEOUS | 3 refills | Status: DC

## 2015-12-10 MED ORDER — ONDANSETRON HCL 8 MG PO TABS: 8.0000 mg | ORAL_TABLET | Freq: Two times a day (BID) | ORAL | 1 refills | Status: DC | PRN

## 2015-12-10 MED ORDER — PROCHLORPERAZINE MALEATE 10 MG PO TABS: 10.0000 mg | ORAL_TABLET | Freq: Four times a day (QID) | ORAL | 1 refills | Status: DC | PRN

## 2015-12-11 ENCOUNTER — Emergency Department (HOSPITAL_COMMUNITY)
Admission: EM | Admit: 2015-12-11 | Discharge: 2015-12-11 | Disposition: A | Discharge status: Home / Self Care | Payer: Medicare HMO | Attending: Emergency Medicine | Admitting: Emergency Medicine

## 2015-12-11 ENCOUNTER — Encounter (HOSPITAL_COMMUNITY): Payer: Self-pay | Admitting: Emergency Medicine

## 2015-12-11 DIAGNOSIS — Z87891 Personal history of nicotine dependence: Secondary | ICD-10-CM | POA: Insufficient documentation

## 2015-12-11 DIAGNOSIS — Z79899 Other long term (current) drug therapy: Secondary | ICD-10-CM | POA: Diagnosis not present

## 2015-12-11 DIAGNOSIS — J441 Chronic obstructive pulmonary disease with (acute) exacerbation: Secondary | ICD-10-CM | POA: Diagnosis not present

## 2015-12-11 DIAGNOSIS — R1084 Generalized abdominal pain: Secondary | ICD-10-CM

## 2015-12-11 DIAGNOSIS — C259 Malignant neoplasm of pancreas, unspecified: Secondary | ICD-10-CM | POA: Diagnosis not present

## 2015-12-11 DIAGNOSIS — I10 Essential (primary) hypertension: Secondary | ICD-10-CM | POA: Diagnosis not present

## 2015-12-11 DIAGNOSIS — R109 Unspecified abdominal pain: Secondary | ICD-10-CM | POA: Diagnosis present

## 2015-12-11 DIAGNOSIS — J45909 Unspecified asthma, uncomplicated: Secondary | ICD-10-CM | POA: Insufficient documentation

## 2015-12-11 MED ORDER — HYDROCODONE-ACETAMINOPHEN 5-325 MG PO TABS: 2.0000 | ORAL_TABLET | ORAL | 0 refills | Status: DC | PRN

## 2015-12-11 MED ORDER — GI COCKTAIL ~~LOC~~
30.0000 mL | Freq: Once | ORAL | Status: AC
  Administered 2015-12-11: 30 mL via ORAL
  Filled 2015-12-11: qty 30

## 2015-12-11 MED ORDER — FAMOTIDINE 20 MG PO TABS
20.0000 mg | ORAL_TABLET | Freq: Once | ORAL | Status: AC
  Administered 2015-12-11: 20 mg via ORAL
  Filled 2015-12-11: qty 1

## 2015-12-11 NOTE — ED Triage Notes (Signed)
PT c/o middle abdominal pain recurring and worsening since yesterday with loss of appetite. PT states recent dx of pancreatic cancer. PT states normal BM today and denies any urinary symptoms.

## 2015-12-11 NOTE — ED Provider Notes (Signed)
[facility] DEPT Provider Note   CSN: [id number] Arrival date & time: 12/11/15  1325     History   Chief Complaint Chief Complaint  Patient presents with  . Abdominal Pain    HPI Lindsay Salazar is a 65 y.o. female.  She presents for evaluation of burning sensation in her entire abdomen, present for several days, intermittently. She has had somewhat decreased appetite but denies vomiting. She has an occasional cough productive of white sputum. She stopped smoking 2 weeks ago after she was diagnosed with pancreatic cancer. She is due to have a vascular access device placed on 12/18/2015, in preparation for chemotherapy. Has been deemed to have nonsurgical cancer at this time, but may proceed to cancer after the initial treatments are complete. She denies fever but has occasional chills. She has diarrhea which she attributes to "eating peanuts". There are no other known modifying factors.  HPI  Past Medical History:  Diagnosis Date  . Anginal pain ([place])    thought she was having   heartburn  . Arthritis    bursitis , fibromyalgia  . Arthritis    "right hip" (11/27/2015)  . Asthma   . Atrial fibrillation ([place])   . Chronic bronchitis ([place])   . Chronic right hip pain   . COPD (chronic obstructive pulmonary disease) ([place])    wears home o2 prn  . Daily headache    "last 2-3 months" (11/27/2015)  . Exposure to TB    "mom had it when she was pregnant with me"  . Fibromyalgia   . GERD (gastroesophageal reflux disease)    use to have it but not now  . Heart murmur    41-  60 years old  . Hypertension   . On home oxygen therapy    11/27/2015 "whenever I need it; don't know how many liters"  . Pancreatic cancer ([place])   . Pneumonia    "several times" (11/27/2015)  . Pneumothorax     Patient Active Problem List   Diagnosis Date Noted  . Pancreatic cancer ([place]) 12/07/2015  . Epigastric pain   . Abdominal pain 11/27/2015  . Pancreatic mass 11/27/2015  . Hypokalemia 11/27/2015    . Leukocytosis 11/27/2015  . Chronic anticoagulation-Xarelto 09/06/2015  . Gastrointestinal hemorrhage with melena 08/08/2015  . Acute blood loss anemia 08/08/2015  . Chronic obstructive pulmonary disease (COPD) ([place]) 08/08/2015  . Chest pain 08/08/2015  . Dyspnea   . Elevated troponin I level 07/17/2015  . Hyperglycemia 07/17/2015  . Anemia 07/17/2015  . Atrial fibrillation ([place]) 07/17/2015  . Atrial flutter ([place]) 06/27/2015  . Atrial fibrillation with RVR ([place]) 06/27/2015  . COPD with exacerbation ([place]) 06/27/2015  . Hypertensive urgency 06/27/2015  . Tobacco abuse 06/27/2015  . Elevated troponin 06/27/2015  . CAP (community acquired pneumonia)   . OSTEOPOROSIS 04/10/2009  . WEIGHT LOSS 04/10/2009    Past Surgical History:  Procedure Laterality Date  . ANTERIOR CERVICAL DECOMP/DISCECTOMY FUSION     "put 4 screws in"  . BACK SURGERY    . CARDIAC CATHETERIZATION  08/2004   Archie Endo 06/18/2010  . CARDIOVERSION N/A 07/18/2015   Procedure: CARDIOVERSION;  Surgeon: Josue Hector, MD;  Location: [facility]ITE;  Service: Cardiovascular;  Laterality: N/A;  . ESOPHAGOGASTRODUODENOSCOPY N/A 08/09/2015   Procedure: ESOPHAGOGASTRODUODENOSCOPY (EGD);  Surgeon: Rogene Houston, MD;  Location: [facility] END[facility] SUITE;  Service: Endoscopy;  Laterality: N/A;  . EUS N/A 11/29/2015   Procedure: UPPER ENDOSCOPIC ULTRASOUND (EUS) RADIAL;  Surgeon: Milus Banister,  MD;  Location: [facility] END[facility];  Service: Endoscopy;  Laterality: N/A;  . TONSILLECTOMY    . TUBAL LIGATION      OB History    Gravida Para Term Preterm AB Living   2         2   SAB TAB Ectopic Multiple Live Births                   Home Medications    Prior to Admission medications   Medication Sig Start Date End Date Taking? Authorizing Provider  benzonatate (TESSALON) 100 MG capsule Take 1 capsule (100 mg total) by mouth 3 (three) times daily. 12/03/15  Yes Ripudeep Krystal Eaton, MD  diltiazem (CARDIZEM CD) 240 MG 24 hr capsule Take 1 capsule  (240 mg total) by mouth daily. 12/03/15  Yes Ripudeep Krystal Eaton, MD  doxycycline (VIBRA-TABS) 100 MG tablet Take 1 tablet (100 mg total) by mouth 2 (two) times daily. X 7days 12/03/15  Yes Ripudeep K Rai, MD  escitalopram (LEXAPRO) 10 MG tablet Take 1 tablet (10 mg total) by mouth every evening. 12/03/15  Yes Ripudeep Krystal Eaton, MD  furosemide (LASIX) 20 MG tablet Take 1 tablet (20 mg total) by mouth daily as needed for edema. Patient taking differently: Take 20 mg by mouth daily as needed for fluid or edema.  07/06/15  Yes Arnoldo Lenis, MD  metoprolol tartrate 37.5 MG TABS Take 37.5 mg by mouth 2 (two) times daily. 12/03/15  Yes Ripudeep K Rai, MD  nicotine (NICODERM CQ - DOSED IN MG/24 HOURS) 14 mg/24hr patch Place 1 patch (14 mg total) onto the skin daily. 12/03/15  Yes Ripudeep K Rai, MD  pantoprazole (PROTONIX) 40 MG tablet Take 1 tablet (40 mg total) by mouth daily. 12/03/15  Yes Ripudeep Krystal Eaton, MD  rivaroxaban (XARELTO) 20 MG TABS tablet Take 1 tablet (20 mg total) by mouth daily with supper. 07/19/15  Yes Reyne Dumas, MD  SYMBICORT 160-4.5 MCG/ACT inhaler Inhale 1 puff into the lungs 2 (two) times daily. 12/03/15  Yes Ripudeep K Rai, MD  albuterol (PROVENTIL) (2.5 MG/3ML) 0.083% nebulizer solution Take 3 mLs (2.5 mg total) by nebulization every 6 (six) hours as needed for wheezing or shortness of breath. 12/03/15   Ripudeep Krystal Eaton, MD  COMBIVENT RESPIMAT 20-100 MCG/ACT AERS respimat Inhale 1 puff into the lungs 2 (two) times daily. 12/03/15   Ripudeep Krystal Eaton, MD  Gemcitabine HCl (GEMZAR IV) Inject into the vein. Day 1, day 8, day 15, every 28 days    Historical Provider, MD  HYDROcodone-acetaminophen (NORCO) 5-325 MG tablet Take 2 tablets by mouth every 4 (four) hours as needed. 12/11/15   Daleen Bo, MD  HYDROcodone-acetaminophen (NORCO/VICODIN) 5-325 MG tablet Take 2 tablets by mouth every 4 (four) hours as needed. 12/11/15   Daleen Bo, MD  ipratropium-albuterol (DUONEB) 0.5-2.5 (3) MG/3ML  SOLN Inhale 3 mLs into the lungs every 6 (six) hours as needed (for shortness of breath). 12/03/15   Ripudeep Krystal Eaton, MD  lidocaine-prilocaine (EMLA) cream Apply to affected area once 12/10/15   Patrici Ranks, MD  ondansetron (ZOFRAN) 8 MG tablet Take 1 tablet (8 mg total) by mouth 2 (two) times daily as needed (Nausea or vomiting). 12/10/15   Patrici Ranks, MD  PACLitaxel Protein-Bound Part (ABRAXANE IV) Inject into the vein. Day 1, day 8, day 15, every 28 days    Historical Provider, MD  prochlorperazine (COMPAZINE) 10 MG tablet Take 1 tablet (10 mg total) by  mouth every 6 (six) hours as needed (Nausea or vomiting). 12/10/15   Patrici Ranks, MD    Family History Family History  Problem Relation Age of Onset  . COPD Mother   . Diabetes Father   . Stroke Father     Social History Social History  Substance Use Topics  . Smoking status: Former Smoker    Packs/day: 0.50    Years: 46.00    Types: Cigarettes    Start date: 10/09/1969    Quit date: 11/29/2015  . Smokeless tobacco: Never Used  . Alcohol use No     Comment: quit 30 years ago     Allergies   Ibuprofen; Penicillins; Tiotropium bromide monohydrate; Tramadol; and Vicodin [hydrocodone-acetaminophen]   Review of Systems Review of Systems  All other systems reviewed and are negative.    Physical Exam Updated Vital Signs BP 119/79 (BP Location: Left Arm)   Pulse 79   Temp 98 F (36.7 C) (Oral)   Resp 18   Ht 5\' 3"  (1.6 m)   Wt 131 lb (59.4 kg)   SpO2 99%   BMI 23.21 kg/m   Physical Exam  Constitutional: She is oriented to person, place, and time. She appears well-developed.  Appears older than stated age.  HENT:  Head: Normocephalic and atraumatic.  Eyes: Conjunctivae and EOM are normal. Pupils are equal, round, and reactive to light.  Neck: Normal range of motion and phonation normal. Neck supple.  Cardiovascular: Normal rate and regular rhythm.   Pulmonary/Chest: Effort normal and breath sounds  normal. She exhibits no tenderness.  Abdominal: Soft. She exhibits no distension. There is tenderness (Diffuse, mild). There is no guarding.  Musculoskeletal: Normal range of motion.  Neurological: She is alert and oriented to person, place, and time. She exhibits normal muscle tone.  Skin: Skin is warm and dry.  Psychiatric: She has a normal mood and affect. Her behavior is normal. Judgment and thought content normal.  Nursing note and vitals reviewed.    ED Treatments / Results  Labs (all labs ordered are listed, but only abnormal results are displayed) Labs Reviewed - No data to display  EKG  EKG Interpretation None       Radiology No results found.  Procedures Procedures (including critical care time)  Medications Ordered in ED Medications  gi cocktail (Maalox,Lidocaine,Donnatal) (30 mLs Oral Given 12/11/15 1656)  famotidine (PEPCID) tablet 20 mg (20 mg Oral Given 12/11/15 1656)     Initial Impression / Assessment and Plan / ED Course  I have reviewed the triage vital signs and the nursing notes.  Pertinent labs & imaging results that were available during my care of the patient were reviewed by me and considered in my medical decision making (see chart for details).  Clinical Course     Medications  gi cocktail (Maalox,Lidocaine,Donnatal) (30 mLs Oral Given 12/11/15 1656)  famotidine (PEPCID) tablet 20 mg (20 mg Oral Given 12/11/15 1656)    Patient Vitals for the past 24 hrs:  BP Temp Temp src Pulse Resp SpO2 Height Weight  12/11/15 1853 119/79 - - 79 18 99 % - -  12/11/15 1335 - - - - - - 5\' 3"  (1.6 m) 131 lb (59.4 kg)  12/11/15 1334 144/65 98 F (36.7 C) Oral 82 24 100 % - -    6:58 PM Reevaluation with update and discussion. After initial assessment and treatment, an updated evaluation reveals she is feeling better, She requests "pain medicine". Findings discussed with patient and  all questions answered. Lukasz Rogus L    Final Clinical Impressions(s) /  ED Diagnoses   Final diagnoses:  Generalized abdominal pain    Nonspecific abdominal pain. Recent comprehensive evaluation. Suspect peptic ulcer disease, versus pancreatic tumor pain. Doubt serious bacterial  infection, metabolic instability or impending vascular collapse.  Nursing Notes Reviewed/ Care Coordinated Applicable Imaging Reviewed Interpretation of Laboratory Data incorporated into ED treatment  The patient appears reasonably screened and/or stabilized for discharge and I doubt any other medical condition or other [facility] requiring further screening, evaluation, or treatment in the ED at this time prior to discharge.  Plan: Home Medications- continue; Home Treatments- rest; return here if the recommended treatment, does not improve the symptoms; Recommended follow up- PCP 1 week for check up   New Prescriptions New Prescriptions   HYDROCODONE-ACETAMINOPHEN (NORCO) 5-325 MG TABLET    Take 2 tablets by mouth every 4 (four) hours as needed.   HYDROCODONE-ACETAMINOPHEN (NORCO/VICODIN) 5-325 MG TABLET    Take 2 tablets by mouth every 4 (four) hours as needed.     Daleen Bo, MD 12/11/15 1900

## 2015-12-11 NOTE — ED Notes (Signed)
Pt given pre-pack and instructions reviewed at this time. Pt denied any further questions or concerns at this time. Ambulatory to lobby at this time with steady and even gait.

## 2015-12-11 NOTE — Discharge Instructions (Signed)
In addition to your current medicine, start taking ranitidine 150 mg, or famotidine 20 mg twice a day each, for about a month. Also try using Maalox before meals and at bedtime to help with the abdominal discomfort. We will also prescribe a short course of narcotic pain relievers, to take as needed. It is a good idea to use stool softeners when you take narcotic pain medicines, to help avoiding constipation.

## 2015-12-12 ENCOUNTER — Encounter (HOSPITAL_COMMUNITY): Payer: Medicare HMO

## 2015-12-12 DIAGNOSIS — C251 Malignant neoplasm of body of pancreas: Secondary | ICD-10-CM

## 2015-12-13 ENCOUNTER — Telehealth: Payer: Self-pay | Admitting: Orthopaedic Surgery

## 2015-12-13 MED ORDER — HYDROCODONE-ACETAMINOPHEN 5-325 MG PO TABS: 1.0000 | ORAL_TABLET | Freq: Four times a day (QID) | ORAL | 0 refills | Status: DC | PRN

## 2015-12-13 NOTE — Telephone Encounter (Signed)
Patient requests refill on Hydrocodone/Acetaminophen  5-325 mgs.

## 2015-12-16 ENCOUNTER — Other Ambulatory Visit: Payer: Self-pay | Admitting: Radiology

## 2015-12-17 MED FILL — Hydrocodone-Acetaminophen Tab 5-325 MG: ORAL | Qty: 6 | Status: AC

## 2015-12-18 ENCOUNTER — Encounter: Payer: Medicare HMO | Admitting: Adult Health

## 2015-12-18 ENCOUNTER — Ambulatory Visit (HOSPITAL_COMMUNITY)
Admission: RE | Admit: 2015-12-18 | Discharge: 2015-12-18 | Disposition: A | Discharge status: Home / Self Care | Payer: Medicare HMO | Source: Ambulatory Visit | Attending: Interventional Radiology | Admitting: Interventional Radiology

## 2015-12-18 ENCOUNTER — Ambulatory Visit (HOSPITAL_COMMUNITY)
Admission: RE | Admit: 2015-12-18 | Discharge: 2015-12-18 | Disposition: A | Discharge status: Home / Self Care | Payer: Medicare HMO | Source: Ambulatory Visit | Attending: Hematology & Oncology | Admitting: Hematology & Oncology

## 2015-12-18 ENCOUNTER — Encounter (HOSPITAL_COMMUNITY): Payer: Self-pay

## 2015-12-18 ENCOUNTER — Other Ambulatory Visit (HOSPITAL_COMMUNITY): Payer: Self-pay | Admitting: Hematology & Oncology

## 2015-12-18 ENCOUNTER — Encounter: Payer: Self-pay | Admitting: Adult Health

## 2015-12-18 DIAGNOSIS — Z87891 Personal history of nicotine dependence: Secondary | ICD-10-CM | POA: Diagnosis not present

## 2015-12-18 DIAGNOSIS — I4891 Unspecified atrial fibrillation: Secondary | ICD-10-CM | POA: Diagnosis not present

## 2015-12-18 DIAGNOSIS — Z7901 Long term (current) use of anticoagulants: Secondary | ICD-10-CM | POA: Diagnosis not present

## 2015-12-18 DIAGNOSIS — J449 Chronic obstructive pulmonary disease, unspecified: Secondary | ICD-10-CM | POA: Insufficient documentation

## 2015-12-18 DIAGNOSIS — Z9981 Dependence on supplemental oxygen: Secondary | ICD-10-CM | POA: Diagnosis not present

## 2015-12-18 DIAGNOSIS — C252 Malignant neoplasm of tail of pancreas: Secondary | ICD-10-CM

## 2015-12-18 DIAGNOSIS — I10 Essential (primary) hypertension: Secondary | ICD-10-CM | POA: Insufficient documentation

## 2015-12-18 DIAGNOSIS — Z79899 Other long term (current) drug therapy: Secondary | ICD-10-CM | POA: Insufficient documentation

## 2015-12-18 DIAGNOSIS — C259 Malignant neoplasm of pancreas, unspecified: Secondary | ICD-10-CM | POA: Diagnosis not present

## 2015-12-18 HISTORY — PX: IR GENERIC HISTORICAL: IMG1180011

## 2015-12-18 LAB — CBC WITH DIFFERENTIAL/PLATELET
BASOS PCT: 0 %
Basophils Absolute: 0 10*3/uL (ref 0.0–0.1)
Eosinophils Absolute: 0.1 10*3/uL (ref 0.0–0.7)
Eosinophils Relative: 1 %
HEMATOCRIT: 36.2 % (ref 36.0–46.0)
Hemoglobin: 11.4 g/dL — ABNORMAL LOW (ref 12.0–15.0)
Lymphocytes Relative: 30 %
Lymphs Abs: 2.6 10*3/uL (ref 0.7–4.0)
MCH: 24.7 pg — ABNORMAL LOW (ref 26.0–34.0)
MCHC: 31.5 g/dL (ref 30.0–36.0)
MCV: 78.4 fL (ref 78.0–100.0)
MONO ABS: 0.7 10*3/uL (ref 0.1–1.0)
MONOS PCT: 8 %
NEUTROS ABS: 5.2 10*3/uL (ref 1.7–7.7)
Neutrophils Relative %: 61 %
Platelets: 322 10*3/uL (ref 150–400)
RBC: 4.62 MIL/uL (ref 3.87–5.11)
RDW: 18.3 % — AB (ref 11.5–15.5)
WBC: 8.5 10*3/uL (ref 4.0–10.5)

## 2015-12-18 LAB — PROTIME-INR
INR: 1.04
Prothrombin Time: 13.6 seconds (ref 11.4–15.2)

## 2015-12-18 MED ORDER — HEPARIN SOD (PORK) LOCK FLUSH 100 UNIT/ML IV SOLN
INTRAVENOUS | Status: AC
  Administered 2015-12-18: 500 [IU]
  Filled 2015-12-18: qty 5

## 2015-12-18 MED ORDER — LIDOCAINE-EPINEPHRINE (PF) 2 %-1:200000 IJ SOLN
INTRAMUSCULAR | Status: AC | PRN
  Administered 2015-12-18: 10 mL via INTRADERMAL

## 2015-12-18 MED ORDER — MIDAZOLAM HCL 2 MG/2ML IJ SOLN
INTRAMUSCULAR | Status: AC
  Filled 2015-12-18: qty 4

## 2015-12-18 MED ORDER — MIDAZOLAM HCL 2 MG/2ML IJ SOLN
INTRAMUSCULAR | Status: AC | PRN
  Administered 2015-12-18 (×4): 1 mg via INTRAVENOUS

## 2015-12-18 MED ORDER — FENTANYL CITRATE (PF) 100 MCG/2ML IJ SOLN
INTRAMUSCULAR | Status: AC
  Filled 2015-12-18: qty 4

## 2015-12-18 MED ORDER — LIDOCAINE-EPINEPHRINE (PF) 2 %-1:200000 IJ SOLN
INTRAMUSCULAR | Status: DC
Start: 2015-12-18 — End: 2015-12-19
  Filled 2015-12-18: qty 20

## 2015-12-18 MED ORDER — FENTANYL CITRATE (PF) 100 MCG/2ML IJ SOLN
INTRAMUSCULAR | Status: AC | PRN
  Administered 2015-12-18 (×3): 50 ug via INTRAVENOUS

## 2015-12-18 MED ORDER — CLINDAMYCIN PHOSPHATE 900 MG/50ML IV SOLN
900.0000 mg | Freq: Once | INTRAVENOUS | Status: AC
  Administered 2015-12-18: 900 mg via INTRAVENOUS
  Filled 2015-12-18: qty 50

## 2015-12-18 MED ORDER — SODIUM CHLORIDE 0.9 % IV SOLN
INTRAVENOUS | Status: DC
  Administered 2015-12-18: 12:00:00 via INTRAVENOUS

## 2015-12-18 NOTE — Consult Note (Signed)
Chief Complaint: Patient was seen in consultation today for port-a-cath placement.   Referring Physician(s): Patrici Ranks  Supervising Physician: Sandi Mariscal  Patient Status: [facility] - In-pt  History of Present Illness: Lindsay Salazar is a 65 y.o. female with a history of tobacco use, A. Fib, COPD, fibromyalgia, anemia, and hypertension who presents today for port-a-cath placement for future chemotherapy for recently diagnosed pancreatic cancer.   Past Medical History:  Diagnosis Date  . Anginal pain ([place])    thought she was having   heartburn  . Arthritis    bursitis , fibromyalgia  . Arthritis    "right hip" (11/27/2015)  . Asthma   . Atrial fibrillation ([place])   . Chronic bronchitis ([place])   . Chronic right hip pain   . COPD (chronic obstructive pulmonary disease) ([place])    wears home o2 prn  . Daily headache    "last 2-3 months" (11/27/2015)  . Exposure to TB    "mom had it when she was pregnant with me"  . Fibromyalgia   . GERD (gastroesophageal reflux disease)    use to have it but not now  . Heart murmur    16-  67 years old  . Hypertension   . On home oxygen therapy    11/27/2015 "whenever I need it; don't know how many liters"  . Pancreatic cancer ([place])   . Pneumonia    "several times" (11/27/2015)  . Pneumothorax     Past Surgical History:  Procedure Laterality Date  . ANTERIOR CERVICAL DECOMP/DISCECTOMY FUSION     "put 4 screws in"  . BACK SURGERY    . CARDIAC CATHETERIZATION  08/2004   Archie Endo 06/18/2010  . CARDIOVERSION N/A 07/18/2015   Procedure: CARDIOVERSION;  Surgeon: Josue Hector, MD;  Location: [facility];  Service: Cardiovascular;  Laterality: N/A;  . ESOPHAGOGASTRODUODENOSCOPY N/A 08/09/2015   Procedure: ESOPHAGOGASTRODUODENOSCOPY (EGD);  Surgeon: Rogene Houston, MD;  Location: [facility];  Service: Endoscopy;  Laterality: N/A;  . EUS N/A 11/29/2015   Procedure: UPPER ENDOSCOPIC ULTRASOUND (EUS) RADIAL;  Surgeon: Milus Banister, MD;   Location: [facility] ENDOSCOPY;  Service: Endoscopy;  Laterality: N/A;  . TONSILLECTOMY    . TUBAL LIGATION      Allergies: Ibuprofen; Penicillins; Tiotropium bromide monohydrate; Tramadol; and Vicodin [hydrocodone-acetaminophen]  Medications: Prior to Admission medications   Medication Sig Start Date End Date Taking? Authorizing Provider  albuterol (PROVENTIL) (2.5 MG/3ML) 0.083% nebulizer solution Take 3 mLs (2.5 mg total) by nebulization every 6 (six) hours as needed for wheezing or shortness of breath. 12/03/15  Yes Ripudeep Krystal Eaton, MD  benzonatate (TESSALON) 100 MG capsule Take 1 capsule (100 mg total) by mouth 3 (three) times daily. 12/03/15  Yes Ripudeep Krystal Eaton, MD  diltiazem (CARDIZEM CD) 240 MG 24 hr capsule Take 1 capsule (240 mg total) by mouth daily. 12/03/15  Yes Ripudeep Krystal Eaton, MD  escitalopram (LEXAPRO) 10 MG tablet Take 1 tablet (10 mg total) by mouth every evening. 12/03/15  Yes Ripudeep Krystal Eaton, MD  HYDROcodone-acetaminophen (NORCO) 5-325 MG tablet Take 2 tablets by mouth every 4 (four) hours as needed. 12/11/15  Yes Daleen Bo, MD  metoprolol tartrate 37.5 MG TABS Take 37.5 mg by mouth 2 (two) times daily. 12/03/15  Yes Ripudeep K Rai, MD  nicotine (NICODERM CQ - DOSED IN MG/24 HOURS) 14 mg/24hr patch Place 1 patch (14 mg total) onto the skin daily. 12/03/15  Yes Ripudeep Krystal Eaton, MD  pantoprazole (  PROTONIX) 40 MG tablet Take 1 tablet (40 mg total) by mouth daily. 12/03/15  Yes Ripudeep Krystal Eaton, MD  SYMBICORT 160-4.5 MCG/ACT inhaler Inhale 1 puff into the lungs 2 (two) times daily. 12/03/15  Yes Ripudeep K Rai, MD  COMBIVENT RESPIMAT 20-100 MCG/ACT AERS respimat Inhale 1 puff into the lungs 2 (two) times daily. 12/03/15   Ripudeep Krystal Eaton, MD  doxycycline (VIBRA-TABS) 100 MG tablet Take 1 tablet (100 mg total) by mouth 2 (two) times daily. X 7days 12/03/15   Ripudeep Krystal Eaton, MD  furosemide (LASIX) 20 MG tablet Take 1 tablet (20 mg total) by mouth daily as needed for edema. Patient taking  differently: Take 20 mg by mouth daily as needed for fluid or edema.  07/06/15   Arnoldo Lenis, MD  Gemcitabine HCl (GEMZAR IV) Inject into the vein. Day 1, day 8, day 15, every 28 days    Historical Provider, MD  HYDROcodone-acetaminophen (NORCO/VICODIN) 5-325 MG tablet Take 2 tablets by mouth every 4 (four) hours as needed. 12/11/15   Daleen Bo, MD  HYDROcodone-acetaminophen (NORCO/VICODIN) 5-325 MG tablet Take 1 tablet by mouth every 6 (six) hours as needed for moderate pain (Must last 30 days.Do not take and drive a car or use machinery.). 12/13/15   Sanjuana Kava, MD  ipratropium-albuterol (DUONEB) 0.5-2.5 (3) MG/3ML SOLN Inhale 3 mLs into the lungs every 6 (six) hours as needed (for shortness of breath). 12/03/15   Ripudeep Krystal Eaton, MD  lidocaine-prilocaine (EMLA) cream Apply to affected area once 12/10/15   Patrici Ranks, MD  ondansetron (ZOFRAN) 8 MG tablet Take 1 tablet (8 mg total) by mouth 2 (two) times daily as needed (Nausea or vomiting). 12/10/15   Patrici Ranks, MD  PACLitaxel Protein-Bound Part (ABRAXANE IV) Inject into the vein. Day 1, day 8, day 15, every 28 days    Historical Provider, MD  prochlorperazine (COMPAZINE) 10 MG tablet Take 1 tablet (10 mg total) by mouth every 6 (six) hours as needed (Nausea or vomiting). 12/10/15   Patrici Ranks, MD  rivaroxaban (XARELTO) 20 MG TABS tablet Take 1 tablet (20 mg total) by mouth daily with supper. 07/19/15   Reyne Dumas, MD     Family History  Problem Relation Age of Onset  . COPD Mother   . Diabetes Father   . Stroke Father     Social History   Social History  . Marital status: Divorced    Spouse name: N/A  . Number of children: N/A  . Years of education: N/A   Occupational History  . retired    Social History Main Topics  . Smoking status: Former Smoker    Packs/day: 0.50    Years: 46.00    Types: Cigarettes    Start date: 10/09/1969    Quit date: 11/29/2015  . Smokeless tobacco: Never Used  . Alcohol  use No     Comment: quit 30 years ago  . Drug use: No  . Sexual activity: Not Currently    Birth control/ protection: Surgical, Post-menopausal   Other Topics Concern  . None   Social History Narrative  . None     Review of Systems Patient denies fever, headache, chest pain, nausea, vomiting, and bleeding. Patient complains of abdominal pain, back pain, shortness of breath.  Vital Signs: Blood pressure 146/79, heart rate 79, respirations 18, temperature 98.8, O2 sat 100%  Physical Exam  Alert and oriented Pulm: intermittent wheezing CV: irregular rhythm, nl rate Abd: soft, nondistended.  Tender to palpation in all quadrants Ext: no edema  Mallampati Score:     Imaging: Ct Chest W Contrast  Result Date: 12/01/2015 CLINICAL DATA:  Chest/abdominal pain, pancreatic cancer EXAM: CT CHEST WITH CONTRAST TECHNIQUE: Multidetector CT imaging of the chest was performed during intravenous contrast administration. CONTRAST:  12mL ISOVUE-300 IOPAMIDOL (ISOVUE-300) INJECTION 61% COMPARISON:  Partial comparison to CT abdomen pelvis dated 11/27/2015. FINDINGS: Cardiovascular: Heart is top-normal in size. Left atrial enlargement. No pericardial effusion. Atherosclerotic calcifications of the aortic arch. Mediastinum/Nodes: Mild mediastinal lymphadenopathy, including a 12 mm short axis low right paratracheal node (series 201/ image 20) and a 13 mm short axis subcarinal node (series 201/ image 29). 7 mm partially calcified right hilar node and 8 mm short axis left hilar node, within normal limits. Visualized thyroid is unremarkable. Lungs/Pleura: Biapical pleural-parenchymal scarring, right greater than left, with associated right apical calcification. Moderate centrilobular emphysematous changes, upper lobe predominant. Patchy bilateral lower lobe opacities, right greater than left, likely reflecting atelectasis, although superimposed right lower lobe pneumonia is not excluded. Small bilateral pleural  effusions. No suspicious pulmonary nodules. No pneumothorax. Upper Abdomen: Visualized upper abdomen is unchanged from recent CT, noting a dominant left hepatic lobe cystic and an incompletely visualized mass in the pancreatic tail (series 201/ image 102). Musculoskeletal: Mild degenerative changes of the upper thoracic spine. IMPRESSION: Patchy bilateral lower lobe opacities, likely reflecting atelectasis, superimposed right lower lobe pneumonia not excluded. Small bilateral pleural effusions. Mild mediastinal lymphadenopathy, possibly reactive. Given the abdominal findings, nodal metastases are not excluded. Consider PET-CT for staging as clinically warranted. Electronically Signed   By: Julian Hy M.D.   On: 12/01/2015 11:16   Mr Abdomen W Wo Contrast  Result Date: 11/28/2015 CLINICAL DATA:  65 year old female inpatient with abdominal pain and newly diagnosed pancreatic body mass on a CT study from 1 day prior. EXAM: MRI ABDOMEN WITHOUT AND WITH CONTRAST TECHNIQUE: Multiplanar multisequence MR imaging of the abdomen was performed both before and after the administration of intravenous contrast. CONTRAST:  53mL MULTIHANCE GADOBENATE DIMEGLUMINE 529 MG/ML IV SOLN COMPARISON:  11/27/2015 CT abdomen/pelvis. FINDINGS: Many of the sequences are motion degraded. Lower chest: Faint tiny clustered nodular opacities in the anterior left lower lobe (series 1404/ image 45), correlating with mild tree-in-bud opacities on the CT study from 1 day prior. Hepatobiliary: Normal liver size and configuration. Mild diffuse hepatic steatosis. There is a simple 6.7 x 6.5 cm posterior left liver lobe cyst. There is a simple 1.1 cm segment 6 right liver lobe cyst. There are innumerable (> than 30) subcentimeter T2 hyperintense nonenhancing lesions scattered throughout the liver most consistent with biliary hamartomas. No suspicious liver masses. Normal gallbladder with no cholelithiasis. No biliary ductal dilatation. Common  bile duct diameter 5 mm. No choledocholithiasis. Pancreas: There is an approximately 5.0 x 2.3 x 2.6 cm distal pancreatic body mass (series 8/ image 20), which demonstrates ill-defined infiltrative appearing margins and heterogeneous hypoenhancement, most consistent with a primary pancreatic adenocarcinoma. There is marked dilatation of the distal main pancreatic duct within the atrophic pancreatic tail (9 mm diameter). This pancreatic mass appears to encase the splenic artery, which remains patent. This pancreatic mass appears to abut the anterior aspect of the splenic vein, which is mildly narrowed and remains patent. This pancreatic mass appears to involve a small portion of the main portal vein wall at the portosplenic confluence, approximately 20-30% of the main portal vein circumference. Main portal vein remains normal in caliber and patent. Superior mesenteric artery appears  uninvolved. No pancreas divisum. Spleen: Normal size. No mass. Adrenals/Urinary Tract: Normal adrenals. No hydronephrosis. Simple 1.7 cm renal cyst in the posterior lower right kidney. Small simple parapelvic renal cysts in the right kidney. No suspicious renal masses. Stomach/Bowel: Grossly normal stomach. Visualized small and large bowel is normal caliber, with no bowel wall thickening. Vascular/Lymphatic: Atherosclerotic nonaneurysmal abdominal aorta. Patent hepatic, left and right portal and renal veins. No pathologically enlarged lymph nodes in the abdomen. Other: No abdominal ascites or focal fluid collection. Musculoskeletal: No aggressive appearing focal osseous lesions. IMPRESSION: 1. Infiltrative hypoenhancing 5.0 x 2.3 x 2.6 cm distal pancreatic body mass, most consistent with a primary pancreatic adenocarcinoma. Atrophy and prominent pancreatic duct dilation in the pancreatic tail. Recommend correlation with endoscopic ultrasound with fine-needle aspiration. 2. Vascular involvement by the pancreatic mass as follows: Encasement  of the patent splenic artery. Mild narrowing and abutment of the splenic vein, which remains patent. Mild involvement of the main portal vein wall at the portosplenic confluence. Portal vein remains patent and normal in caliber. 3. No evidence of metastatic disease in the abdomen. 4. Faint tiny clustered nodular opacities in the anterior left lower lobe, correlating with mild tree-in-bud opacities on the CT study from 1 day prior, indicating a mild infectious or inflammatory bronchiolitis, with the differential including mild aspiration. 5. Additional findings include aortic atherosclerosis, mild diffuse hepatic steatosis and benign liver cysts and hamartomas. Electronically Signed   By: Ilona Sorrel M.D.   On: 11/28/2015 13:25   Ct Abdomen Pelvis W Contrast  Result Date: 11/27/2015 CLINICAL DATA:  Abdominal pain.  History of GI bleeding. EXAM: CT ABDOMEN AND PELVIS WITH CONTRAST TECHNIQUE: Multidetector CT imaging of the abdomen and pelvis was performed using the standard protocol following bolus administration of intravenous contrast. CONTRAST:  100 cc Isovue-300 intravenous COMPARISON:  None. FINDINGS: Lower chest:  No nodules. Hepatobiliary: Simple appearing cysts in the inferior left liver measuring nearly 7 cm. Numerous other low-density areas are well-defined and likely cysts or hamartomas, but limited by small size. No definitive liver metastasis. Pancreas: 32 mm long, 25 mm thick hypoenhancing mass in the pancreatic body with ductal obstruction and parenchymal atrophy seen at the level of the tail. On sagittal reformats the mass encases the mid splenic artery and splenic vein with mild splenic vein narrowing 1-2 cm beyond the portal vein confluence. No infiltration seen around the celiac axis or SMA. No peripancreatic lymph nodes. Spleen: Unremarkable. Adrenals/Urinary Tract: Negative adrenals. No hydronephrosis or stone. Unremarkable bladder. Stomach/Bowel:  No obstruction. No appendicitis.  Vascular/Lymphatic: No acute vascular abnormality. Splenic vascular findings described on pancreas section. Atherosclerosis. No mass or adenopathy. Reproductive:No pathologic findings. Other: No ascites or peritoneal nodules. Musculoskeletal: No acute or aggressive finding IMPRESSION: Findings of pancreas body adenocarcinoma as described above. Recommend EUS/FNA. No definitive metastasis, but limited in the liver due to numerous sub-centimeter cysts/biliary hamartomas. Suggest liver staging with MRI. Electronically Signed   By: Monte Fantasia M.D.   On: 11/27/2015 15:09   Dg Chest Port 1 View  Result Date: 11/29/2015 CLINICAL DATA:  Shortness of breath. EXAM: PORTABLE CHEST 1 VIEW COMPARISON:  08/08/2015. FINDINGS: Mediastinum and hilar structures are normal. Heart size normal. Mild bibasilar atelectasis and/or infiltrates. Biapical pleural-parenchymal thickening noted consistent with scarring. No pleural effusion or pneumothorax. IMPRESSION: 1. Mild right base subsegmental atelectasis. Mild right base infiltrate cannot be excluded. 2.  Biapical pleural-parenchymal scarring . Electronically Signed   By: Marcello Moores  Register   On: 11/29/2015 07:57   US  Abdomen Limited Ruq  Result Date: 11/27/2015 CLINICAL DATA:  One day of abdominal pain. EXAM: US ABDOMEN LIMITED - RIGHT UPPER QUADRANT COMPARISON:  CT of earlier today. FINDINGS: Gallbladder: No gallstones or wall thickening visualized. No sonographic Murphy sign noted by sonographer. Common bile duct: Diameter: Normal, 6 mm. Liver: 8.0 cm left hepatic lobe cyst, as detailed on CT. This has minimal complexity within. IMPRESSION: Normal gallbladder and biliary tree. Left hepatic lobe complex cyst. Electronically Signed   By: Abigail Miyamoto M.D.   On: 11/27/2015 16:11    Labs:  CBC:  Recent Labs  12/01/15 0605 12/02/15 0523 12/03/15 0558 12/18/15 1154  WBC 16.5* 13.8* 10.8* 8.5  HGB 10.2* 10.1* 9.5* 11.4*  HCT 32.9* 32.2* 30.4* 36.2  PLT 286 340 326  322    COAGS:  Recent Labs  11/28/15 0505 11/28/15 2118 11/29/15 0413 12/18/15 1154  INR  --   --   --  1.04  APTT 52* 67* 55*  --     BMP:  Recent Labs  08/09/15 0316 11/27/15 1154 11/28/15 0505 12/03/15 0558  NA 135 138 135 138  K 3.8 3.3* 3.7 4.2  CL 102 104 103 105  CO2 25 26 23 26   GLUCOSE 144* 128* 110* 110*  BUN 46* 22* 13 17  CALCIUM 7.5* 8.7* 8.0* 8.7*  CREATININE 1.04* 1.06* 0.90 0.92  GFRNONAA 54* 53* >60 >60  GFRAA >60 >60 >60 >60    LIVER FUNCTION TESTS:  Recent Labs  07/18/15 0443 07/19/15 0514 11/27/15 1154  BILITOT 0.6 0.8 0.5  AST 17 21 19   ALT 34 34 26  ALKPHOS 61 56 92  PROT 6.0* 5.5* 6.9  ALBUMIN 3.3* 3.0* 3.4*    TUMOR MARKERS:  Recent Labs  11/29/15 0413  CA199 194*    Assessment and Plan:  65 y.o. female with a history of tobacco use, A. Fib, COPD, fibromyalgia, anemia, and hypertension who presents today for port-a-cath placement for future chemotherapy for recently diagnosed pancreatic cancer. Risks and benefits discussed with the patient including, but not limited to bleeding, infection, pneumothorax, or fibrin sheath development and need for additional procedures. All of the patient's questions were answered, patient is agreeable to proceed.Consent signed and in chart.      Thank you for this interesting consult.  I greatly enjoyed [place] and look forward to participating in their care.  A copy of this report was sent to the requesting provider on this date.  Electronically Signed: D. Rowe Robert 12/18/2015, 12:40 PM   I spent a total of 20 minutes in face to face in clinical consultation, greater than 50% of which was counseling/coordinating care for port a cath placement

## 2015-12-18 NOTE — Procedures (Signed)
Successful placement of right IJ approach port-a-cath with tip at the superior caval atrial junction. The catheter is ready for immediate use. Estimated Blood Loss: Minimal No immediate post procedural complications.  Jay Kree Rafter, MD Pager #: [phone]   

## 2015-12-18 NOTE — Discharge Instructions (Signed)
Moderate Conscious Sedation, Adult, Care After °These instructions provide you with information about caring for yourself after your procedure. Your health care provider may also give you more specific instructions. Your treatment has been planned according to current medical practices, but problems sometimes occur. Call your health care provider if you have any problems or questions after your procedure. °What can I expect after the procedure? °After your procedure, it is common: °· To feel sleepy for several hours. °· To feel clumsy and have poor balance for several hours. °· To have poor judgment for several hours. °· To vomit if you eat too soon. °Follow these instructions at home: °For at least 24 hours after the procedure:  °· Do not: °¨ Participate in activities where you could fall or become injured. °¨ Drive. °¨ Use heavy machinery. °¨ Drink alcohol. °¨ Take sleeping pills or medicines that cause drowsiness. °¨ Make important decisions or sign legal documents. °¨ Take care of children on your own. °· Rest. °Eating and drinking °· Follow the diet recommended by your health care provider. °· If you vomit: °¨ Drink water, juice, or soup when you can drink without vomiting. °¨ Make sure you have little or no nausea before eating solid foods. °General instructions °· Have a responsible adult stay with you until you are awake and alert. °· Take over-the-counter and prescription medicines only as told by your health care provider. °· If you smoke, do not smoke without supervision. °· Keep all follow-up visits as told by your health care provider. This is important. °Contact a health care provider if: °· You keep feeling nauseous or you keep vomiting. °· You feel light-headed. °· You develop a rash. °· You have a fever. °Get help right away if: °· You have trouble breathing. °This information is not intended to replace advice given to you by your health care provider. Make sure you discuss any questions you have  with your health care provider. °Document Released: 11/10/2012 Document Revised: 06/25/2015 Document Reviewed: 05/12/2015 °Elsevier Interactive Patient Education © 2017 [employer] ° °Implanted Port Insertion, Care After °Refer to this sheet in the next few weeks. These instructions provide you with information on caring for yourself after your procedure. Your health care provider may also give you more specific instructions. Your treatment has been planned according to current medical practices, but problems sometimes occur. Call your health care provider if you have any problems or questions after your procedure. °WHAT TO EXPECT AFTER THE PROCEDURE °After your procedure, it is typical to have the following:  °· Discomfort at the port insertion site. Ice packs to the area will help. °· Bruising on the skin over the port. This will subside in 3-4 days. °HOME CARE INSTRUCTIONS °· After your port is placed, you will get a manufacturer's information card. The card has information about your port. Keep this card with you at all times.   °· Know what kind of port you have. There are many types of ports available.   °· Wear a medical alert bracelet in case of an emergency. This can help alert health care workers that you have a port.   °· The port can stay in for as long as your health care provider believes it is necessary.   °· A home health care nurse may give medicines and take care of the port.   °· You or a family member can get special training and directions for giving medicine and taking care of the port at home.   °SEEK MEDICAL CARE IF:  °·   Your port does not flush or you are unable to get a blood return.   °· You have a fever or chills. °SEEK IMMEDIATE MEDICAL CARE IF: °· You have new fluid or pus coming from your incision.   °· You notice a bad smell coming from your incision site.   °· You have swelling, pain, or more redness at the incision or port site.   °· You have chest pain or shortness of  breath. °This information is not intended to replace advice given to you by your health care provider. Make sure you discuss any questions you have with your health care provider. °Document Released: 11/10/2012 Document Revised: 01/25/2013 Document Reviewed: 11/10/2012 °Elsevier Interactive Patient Education © 2017 [employer] ° °

## 2015-12-18 NOTE — Progress Notes (Deleted)
Cardiology Office Note   Date:  12/18/2015   ID:  Lindsay Salazar, DOB 01-05-1951, MRN [id number]  PCP:  [name], PA-C  Cardiologist: Cloria Spring, NP   No chief complaint on file.     History of Present Illness: Lindsay Salazar is a 65 y.o. female who presents for ongoing assessment and management of paroxysmal atrial fibrillation, hypertension, with history of COPD and ongoing tobacco abuse. The patient was seen on consultation during hospitalization on 11/28/2015 for recurrence of chest pain and burning. She was also found to have a GI bleed secondary to peptic ulcer disease and was transferred be used 2 units of packed red blood cells and treated for H. Pylori.  She was also found to have a pancreatic mass with a distal pancreatic duct adenocarcinoma. She was seen by oncology, along with pulmonology concerning COPD and acute bronchitis along with pneumonia. Heart rate was controlled with diltiazem and metoprolol, Xarelto was restarted. She is here for close follow-up.    Past Medical History:  Diagnosis Date  . Anginal pain ([place])    thought she was having   heartburn  . Arthritis    bursitis , fibromyalgia  . Arthritis    "right hip" (11/27/2015)  . Asthma   . Atrial fibrillation ([place])   . Chronic bronchitis ([place])   . Chronic right hip pain   . COPD (chronic obstructive pulmonary disease) ([place])    wears home o2 prn  . Daily headache    "last 2-3 months" (11/27/2015)  . Exposure to TB    "mom had it when she was pregnant with me"  . Fibromyalgia   . GERD (gastroesophageal reflux disease)    use to have it but not now  . Heart murmur    70-  51 years old  . Hypertension   . On home oxygen therapy    11/27/2015 "whenever I need it; don't know how many liters"  . Pancreatic cancer ([place])   . Pneumonia    "several times" (11/27/2015)  . Pneumothorax     Past Surgical History:  Procedure Laterality Date  . ANTERIOR CERVICAL DECOMP/DISCECTOMY FUSION      "put 4 screws in"  . BACK SURGERY    . CARDIAC CATHETERIZATION  08/2004   Archie Endo 06/18/2010  . CARDIOVERSION N/A 07/18/2015   Procedure: CARDIOVERSION;  Surgeon: Josue Hector, MD;  [facility];  Service: Cardiovascular;  Laterality: N/A;  . ESOPHAGOGASTRODUODENOSCOPY N/A 08/09/2015   Procedure: ESOPHAGOGASTRODUODENOSCOPY (EGD);  Surgeon: Rogene Houston, MD;  Location: [facility];  Service: Endoscopy;  Laterality: N/A;  . EUS N/A 11/29/2015   Procedure: UPPER ENDOSCOPIC ULTRASOUND (EUS) RADIAL;  Surgeon: Milus Banister, MD;  Location: [facility];  Service: Endoscopy;  Laterality: N/A;  . TONSILLECTOMY    . TUBAL LIGATION       Current Outpatient Prescriptions  Medication Sig Dispense Refill  . albuterol (PROVENTIL) (2.5 MG/3ML) 0.083% nebulizer solution Take 3 mLs (2.5 mg total) by nebulization every 6 (six) hours as needed for wheezing or shortness of breath. 75 mL 12  . benzonatate (TESSALON) 100 MG capsule Take 1 capsule (100 mg total) by mouth 3 (three) times daily. 30 capsule 0  . COMBIVENT RESPIMAT 20-100 MCG/ACT AERS respimat Inhale 1 puff into the lungs 2 (two) times daily. 1 Inhaler 5  . diltiazem (CARDIZEM CD) 240 MG 24 hr capsule Take 1 capsule (240 mg total) by mouth daily. 30 capsule 4  .  doxycycline (VIBRA-TABS) 100 MG tablet Take 1 tablet (100 mg total) by mouth 2 (two) times daily. X 7days 14 tablet 0  . escitalopram (LEXAPRO) 10 MG tablet Take 1 tablet (10 mg total) by mouth every evening. 30 tablet 0  . furosemide (LASIX) 20 MG tablet Take 1 tablet (20 mg total) by mouth daily as needed for edema. (Patient taking differently: Take 20 mg by mouth daily as needed for fluid or edema. ) 90 tablet 3  . Gemcitabine HCl (GEMZAR IV) Inject into the vein. Day 1, day 8, day 15, every 28 days    . HYDROcodone-acetaminophen (NORCO) 5-325 MG tablet Take 2 tablets by mouth every 4 (four) hours as needed. 20 tablet 0  . HYDROcodone-acetaminophen (NORCO/VICODIN) 5-325 MG  tablet Take 2 tablets by mouth every 4 (four) hours as needed. 6 tablet 0  . HYDROcodone-acetaminophen (NORCO/VICODIN) 5-325 MG tablet Take 1 tablet by mouth every 6 (six) hours as needed for moderate pain (Must last 30 days.Do not take and drive a car or use machinery.). 90 tablet 0  . ipratropium-albuterol (DUONEB) 0.5-2.5 (3) MG/3ML SOLN Inhale 3 mLs into the lungs every 6 (six) hours as needed (for shortness of breath). 360 mL 3  . lidocaine-prilocaine (EMLA) cream Apply to affected area once 30 g 3  . metoprolol tartrate 37.5 MG TABS Take 37.5 mg by mouth 2 (two) times daily. 60 tablet 4  . nicotine (NICODERM CQ - DOSED IN MG/24 HOURS) 14 mg/24hr patch Place 1 patch (14 mg total) onto the skin daily. 28 patch 3  . ondansetron (ZOFRAN) 8 MG tablet Take 1 tablet (8 mg total) by mouth 2 (two) times daily as needed (Nausea or vomiting). 30 tablet 1  . PACLitaxel Protein-Bound Part (ABRAXANE IV) Inject into the vein. Day 1, day 8, day 15, every 28 days    . pantoprazole (PROTONIX) 40 MG tablet Take 1 tablet (40 mg total) by mouth daily. 60 tablet 3  . prochlorperazine (COMPAZINE) 10 MG tablet Take 1 tablet (10 mg total) by mouth every 6 (six) hours as needed (Nausea or vomiting). 30 tablet 1  . rivaroxaban (XARELTO) 20 MG TABS tablet Take 1 tablet (20 mg total) by mouth daily with supper. 30 tablet 10  . SYMBICORT 160-4.5 MCG/ACT inhaler Inhale 1 puff into the lungs 2 (two) times daily. 1 Inhaler 12   No current facility-administered medications for this visit.     Allergies:   Ibuprofen; Penicillins; Tiotropium bromide monohydrate; Tramadol; and Vicodin [hydrocodone-acetaminophen]    Social History:  The patient  reports that she quit smoking about 2 weeks ago. Her smoking use included Cigarettes. She started smoking about 46 years ago. She has a 23.00 pack-year smoking history. She has never used smokeless tobacco. She reports that she does not drink alcohol or use drugs.   Family History:   The patient's family history includes COPD in her mother; Diabetes in her father; Stroke in her father.    ROS: All other systems are reviewed and negative. Unless otherwise mentioned in H&P    PHYSICAL EXAM: VS:  There were no vitals taken for this visit. , BMI There is no height or weight on file to calculate BMI. GEN: Well nourished, well developed, in no acute distress  HEENT: normal  Neck: no JVD, carotid bruits, or masses Cardiac: ***RRR; no murmurs, rubs, or gallops,no edema  Respiratory:  clear to auscultation bilaterally, normal work of breathing GI: soft, nontender, nondistended, + BS MS: no deformity or atrophy  Skin: warm and dry, no rash Neuro:  Strength and sensation are intact Psych: euthymic mood, full affect   EKG:  EKG {ACTION; IS/IS [id number] ordered today. The ekg ordered today demonstrates ***   Recent Labs: 06/27/2015: TSH 2.678 07/17/2015: B Natriuretic Peptide 415.0 08/09/2015: Magnesium 1.7 11/27/2015: ALT 26 12/03/2015: BUN 17; Creatinine, Ser 0.92; Hemoglobin 9.5; Platelets 326; Potassium 4.2; Sodium 138    Lipid Panel No results found for: CHOL, TRIG, HDL, CHOLHDL, VLDL, LDLCALC, LDLDIRECT    Wt Readings from Last 3 Encounters:  12/11/15 131 lb (59.4 kg)  12/06/15 132 lb 6.4 oz (60.1 kg)  11/27/15 131 lb (59.4 kg)      Other studies Reviewed: Additional studies/ records that were reviewed today include: ***. Review of the above records demonstrates: ***   ASSESSMENT AND PLAN:  1.  ***   Current medicines are reviewed at length with the patient today.    Labs/ tests ordered today include: *** No orders of the defined types were placed in this encounter.    Disposition:   FU with *** in {gen number [id number] {TIME; UNITS DAY/WEEK/MONTH:19136}   Signed, Jory Sims, NP  12/18/2015 7:13 AM    [facility]. [address] [place], [place] [address] Phone: [phone]; Fax: [phone]

## 2015-12-19 ENCOUNTER — Encounter (HOSPITAL_COMMUNITY): Payer: Medicare HMO | Attending: Hematology & Oncology

## 2015-12-19 ENCOUNTER — Encounter (HOSPITAL_COMMUNITY): Payer: Self-pay

## 2015-12-19 ENCOUNTER — Other Ambulatory Visit (HOSPITAL_COMMUNITY): Payer: Self-pay | Admitting: Emergency Medicine

## 2015-12-19 VITALS — BP 119/43 | HR 70 | Temp 97.5°F | Resp 18 | Wt 126.6 lb

## 2015-12-19 DIAGNOSIS — Z5111 Encounter for antineoplastic chemotherapy: Secondary | ICD-10-CM

## 2015-12-19 DIAGNOSIS — C251 Malignant neoplasm of body of pancreas: Secondary | ICD-10-CM | POA: Diagnosis not present

## 2015-12-19 LAB — COMPREHENSIVE METABOLIC PANEL
ALK PHOS: 76 U/L (ref 38–126)
ALT: 14 U/L (ref 14–54)
AST: 15 U/L (ref 15–41)
Albumin: 3.4 g/dL — ABNORMAL LOW (ref 3.5–5.0)
Anion gap: 8 (ref 5–15)
BILIRUBIN TOTAL: 0.5 mg/dL (ref 0.3–1.2)
BUN: 17 mg/dL (ref 6–20)
CO2: 24 mmol/L (ref 22–32)
CREATININE: 0.99 mg/dL (ref 0.44–1.00)
Calcium: 8.8 mg/dL — ABNORMAL LOW (ref 8.9–10.3)
Chloride: 104 mmol/L (ref 101–111)
GFR calc Af Amer: >60 mL/min (ref >60)
GFR, EST NON AFRICAN AMERICAN: 58 mL/min — AB (ref >60)
Glucose, Bld: 126 mg/dL — ABNORMAL HIGH (ref 65–99)
Potassium: 3.6 mmol/L (ref 3.5–5.1)
Sodium: 136 mmol/L (ref 135–145)
TOTAL PROTEIN: 6.2 g/dL — AB (ref 6.5–8.1)

## 2015-12-19 MED ORDER — HEPARIN SOD (PORK) LOCK FLUSH 100 UNIT/ML IV SOLN
500.0000 [IU] | Freq: Once | INTRAVENOUS | Status: AC | PRN
  Administered 2015-12-19: 500 [IU]
  Filled 2015-12-19 (×2): qty 5

## 2015-12-19 MED ORDER — HYDROCODONE-ACETAMINOPHEN 5-325 MG PO TABS
ORAL_TABLET | ORAL | Status: AC
  Filled 2015-12-19: qty 1

## 2015-12-19 MED ORDER — SODIUM CHLORIDE 0.9% FLUSH: 10.0000 mL | INTRAVENOUS | Status: DC | PRN

## 2015-12-19 MED ORDER — PROCHLORPERAZINE MALEATE 10 MG PO TABS
10.0000 mg | ORAL_TABLET | Freq: Once | ORAL | Status: AC
  Administered 2015-12-19: 10 mg via ORAL
  Filled 2015-12-19: qty 1

## 2015-12-19 MED ORDER — HYDROCODONE-ACETAMINOPHEN 5-325 MG PO TABS
1.0000 | ORAL_TABLET | Freq: Once | ORAL | Status: AC
Start: 2015-12-19 — End: 2015-12-19
  Administered 2015-12-19: 1 via ORAL

## 2015-12-19 MED ORDER — SODIUM CHLORIDE 0.9 % IV SOLN
Freq: Once | INTRAVENOUS | Status: AC
  Administered 2015-12-19: 11:00:00 via INTRAVENOUS

## 2015-12-19 MED ORDER — PACLITAXEL PROTEIN-BOUND CHEMO INJECTION 100 MG
125.0000 mg/m2 | Freq: Once | INTRAVENOUS | Status: AC
  Administered 2015-12-19: 200 mg via INTRAVENOUS
  Filled 2015-12-19: qty 40

## 2015-12-19 MED ORDER — SODIUM CHLORIDE 0.9 % IV SOLN
1000.0000 mg/m2 | Freq: Once | INTRAVENOUS | Status: AC
  Administered 2015-12-19: 1634 mg via INTRAVENOUS
  Filled 2015-12-19: qty 26.3

## 2015-12-19 NOTE — Progress Notes (Signed)
Reports right hip pain - requesting hydrocodone (takes at home q 6 hours).  PA notified and orders rec'd.   Tolerated tx w/o adverse reaction.  Alert, in no distress.  VSS.  Discharged via wheelchair.

## 2015-12-19 NOTE — Patient Instructions (Signed)
[facility] Discharge Instructions for Patients Receiving Chemotherapy   Beginning January 23rd 2017 lab work for the [facility] will be done in the  Main lab at [facility] on 1st floor. If you have a lab appointment with the [facility] please come in thru the  Main Entrance and check in at the main information desk   Today you received the following chemotherapy agents:  Abraxane and Gemzare  To help prevent nausea and vomiting after your treatment, we encourage you to take your nausea medication as prescribed.  If you develop nausea and vomiting, or diarrhea that is not controlled by your medication, call the clinic.  The clinic phone number is [phone]. Office hours are Monday-Friday 8:30am-5:00pm.  BELOW ARE SYMPTOMS THAT SHOULD BE REPORTED IMMEDIATELY:  *FEVER GREATER THAN 101.0 F  *CHILLS WITH OR WITHOUT FEVER  NAUSEA AND VOMITING THAT IS NOT CONTROLLED WITH YOUR NAUSEA MEDICATION  *UNUSUAL SHORTNESS OF BREATH  *UNUSUAL BRUISING OR BLEEDING  TENDERNESS IN MOUTH AND THROAT WITH OR WITHOUT PRESENCE OF ULCERS  *URINARY PROBLEMS  *BOWEL PROBLEMS  UNUSUAL RASH Items with * indicate a potential emergency and should be followed up as soon as possible. If you have an emergency after office hours please contact your primary care physician or go to the nearest emergency department.  Please call the clinic during office hours if you have any questions or concerns.   You may also contact the Patient Navigator at [phone] should you have any questions or need assistance in obtaining follow up care.      Resources For Cancer Patients and their Caregivers ? American Cancer Society: Can assist with transportation, wigs, general needs, runs Look Good Feel Better.        702-664-2852 ? Cancer Care: Provides financial assistance, online support groups, medication/co-pay assistance.  1-800-813-HOPE 971-343-5594) ? [place] Assists [place] cancer patients and their families through emotional , educational and financial support.  713-761-0542 ? [place] DSS Where to apply for food stamps, Medicaid and utility assistance. 470-169-3986 ? [facility]: Transportation to medical appointments. 956-218-0659 ? Social Security Administration: May apply for disability if have a Stage IV cancer. (605)373-3837 867 482 8345 ? [employer], Disability and Transit Services: Assists with nutrition, care and transit needs. (778)391-1475

## 2015-12-20 ENCOUNTER — Telehealth (HOSPITAL_COMMUNITY): Payer: Self-pay | Admitting: *Deleted

## 2015-12-20 LAB — CANCER ANTIGEN 19-9: CA 19-9: 197 U/mL — ABNORMAL HIGH (ref 0–35)

## 2015-12-20 NOTE — Telephone Encounter (Signed)
Voice mail message left for pt to call the clinic with any questions or concerns r/t first chemo tx yesterday, 12/19/15.

## 2015-12-20 NOTE — Addendum Note (Signed)
Addended by: Joie Bimler on: 12/20/2015 04:21 PM   Modules accepted: Orders

## 2015-12-24 ENCOUNTER — Emergency Department (HOSPITAL_COMMUNITY): Payer: Medicare HMO

## 2015-12-24 ENCOUNTER — Encounter (HOSPITAL_COMMUNITY): Payer: Self-pay | Admitting: *Deleted

## 2015-12-24 ENCOUNTER — Emergency Department (HOSPITAL_COMMUNITY)
Admission: EM | Admit: 2015-12-24 | Discharge: 2015-12-24 | Disposition: A | Discharge status: Home / Self Care | Payer: Medicare HMO | Attending: Emergency Medicine | Admitting: Emergency Medicine

## 2015-12-24 DIAGNOSIS — J45909 Unspecified asthma, uncomplicated: Secondary | ICD-10-CM | POA: Insufficient documentation

## 2015-12-24 DIAGNOSIS — Z87891 Personal history of nicotine dependence: Secondary | ICD-10-CM | POA: Diagnosis not present

## 2015-12-24 DIAGNOSIS — J441 Chronic obstructive pulmonary disease with (acute) exacerbation: Secondary | ICD-10-CM | POA: Diagnosis not present

## 2015-12-24 DIAGNOSIS — R197 Diarrhea, unspecified: Secondary | ICD-10-CM | POA: Diagnosis not present

## 2015-12-24 DIAGNOSIS — J189 Pneumonia, unspecified organism: Secondary | ICD-10-CM

## 2015-12-24 DIAGNOSIS — I10 Essential (primary) hypertension: Secondary | ICD-10-CM | POA: Diagnosis not present

## 2015-12-24 DIAGNOSIS — R05 Cough: Secondary | ICD-10-CM | POA: Diagnosis present

## 2015-12-24 DIAGNOSIS — Z79899 Other long term (current) drug therapy: Secondary | ICD-10-CM | POA: Diagnosis not present

## 2015-12-24 DIAGNOSIS — Z8507 Personal history of malignant neoplasm of pancreas: Secondary | ICD-10-CM | POA: Diagnosis not present

## 2015-12-24 LAB — URINALYSIS, ROUTINE W REFLEX MICROSCOPIC
Bilirubin Urine: NEGATIVE
Glucose, UA: NEGATIVE mg/dL
Ketones, ur: NEGATIVE mg/dL
Nitrite: NEGATIVE
Protein, ur: NEGATIVE mg/dL
Specific Gravity, Urine: 1.025 (ref 1.005–1.030)
pH: 6 (ref 5.0–8.0)

## 2015-12-24 LAB — COMPREHENSIVE METABOLIC PANEL
ALT: 16 U/L (ref 14–54)
AST: 14 U/L — ABNORMAL LOW (ref 15–41)
Albumin: 3.2 g/dL — ABNORMAL LOW (ref 3.5–5.0)
Alkaline Phosphatase: 72 U/L (ref 38–126)
Anion gap: 7 (ref 5–15)
BUN: 11 mg/dL (ref 6–20)
CO2: 24 mmol/L (ref 22–32)
Calcium: 8.4 mg/dL — ABNORMAL LOW (ref 8.9–10.3)
Chloride: 104 mmol/L (ref 101–111)
Creatinine, Ser: 0.81 mg/dL (ref 0.44–1.00)
GFR calc Af Amer: >60 mL/min (ref >60)
GFR calc non Af Amer: >60 mL/min (ref >60)
Glucose, Bld: 107 mg/dL — ABNORMAL HIGH (ref 65–99)
Potassium: 3.3 mmol/L — ABNORMAL LOW (ref 3.5–5.1)
Sodium: 135 mmol/L (ref 135–145)
Total Bilirubin: 0.8 mg/dL (ref 0.3–1.2)
Total Protein: 6.2 g/dL — ABNORMAL LOW (ref 6.5–8.1)

## 2015-12-24 LAB — URINE MICROSCOPIC-ADD ON

## 2015-12-24 LAB — CBC
HCT: 30.6 % — ABNORMAL LOW (ref 36.0–46.0)
Hemoglobin: 9.6 g/dL — ABNORMAL LOW (ref 12.0–15.0)
MCH: 25 pg — ABNORMAL LOW (ref 26.0–34.0)
MCHC: 31.4 g/dL (ref 30.0–36.0)
MCV: 79.7 fL (ref 78.0–100.0)
Platelets: 175 10*3/uL (ref 150–400)
RBC: 3.84 MIL/uL — ABNORMAL LOW (ref 3.87–5.11)
RDW: 18.2 % — ABNORMAL HIGH (ref 11.5–15.5)
WBC: 6.6 10*3/uL (ref 4.0–10.5)

## 2015-12-24 LAB — LIPASE, BLOOD: Lipase: 14 U/L (ref 11–51)

## 2015-12-24 MED ORDER — METHYLPREDNISOLONE SODIUM SUCC 125 MG IJ SOLR
125.0000 mg | Freq: Once | INTRAMUSCULAR | Status: AC
  Administered 2015-12-24: 125 mg via INTRAVENOUS
  Filled 2015-12-24: qty 2

## 2015-12-24 MED ORDER — LEVOFLOXACIN 500 MG PO TABS: 500.0000 mg | ORAL_TABLET | Freq: Every day | ORAL | 0 refills | Status: DC

## 2015-12-24 MED ORDER — LEVOFLOXACIN 500 MG PO TABS
500.0000 mg | ORAL_TABLET | Freq: Once | ORAL | Status: AC
  Administered 2015-12-24: 500 mg via ORAL
  Filled 2015-12-24: qty 1

## 2015-12-24 MED ORDER — LOPERAMIDE HCL 2 MG PO CAPS
4.0000 mg | ORAL_CAPSULE | Freq: Once | ORAL | Status: AC
  Administered 2015-12-24: 4 mg via ORAL
  Filled 2015-12-24: qty 2

## 2015-12-24 MED ORDER — HEPARIN SOD (PORK) LOCK FLUSH 100 UNIT/ML IV SOLN
INTRAVENOUS | Status: AC
  Administered 2015-12-24: 21:00:00
  Filled 2015-12-24: qty 5

## 2015-12-24 MED ORDER — MORPHINE SULFATE (PF) 4 MG/ML IV SOLN
4.0000 mg | Freq: Once | INTRAVENOUS | Status: AC
  Administered 2015-12-24: 4 mg via INTRAVENOUS
  Filled 2015-12-24: qty 1

## 2015-12-24 MED ORDER — IPRATROPIUM-ALBUTEROL 0.5-2.5 (3) MG/3ML IN SOLN
3.0000 mL | Freq: Once | RESPIRATORY_TRACT | Status: AC
  Administered 2015-12-24: 3 mL via RESPIRATORY_TRACT
  Filled 2015-12-24: qty 3

## 2015-12-24 MED ORDER — POTASSIUM CHLORIDE CRYS ER 20 MEQ PO TBCR
20.0000 meq | EXTENDED_RELEASE_TABLET | Freq: Once | ORAL | Status: AC
  Administered 2015-12-24: 20 meq via ORAL
  Filled 2015-12-24: qty 1

## 2015-12-24 MED ORDER — IOPAMIDOL (ISOVUE-370) INJECTION 76%
100.0000 mL | Freq: Once | INTRAVENOUS | Status: AC | PRN
  Administered 2015-12-24: 100 mL via INTRAVENOUS

## 2015-12-24 NOTE — ED Triage Notes (Signed)
Pt comes in by EMS from home for diarrhea since last wednesday. Pt states her diarrhea started after getting chemo last Wednesday. Pt has had 2 episodes today. Pt alert and oriented upon arrival.

## 2015-12-24 NOTE — ED Provider Notes (Signed)
[facility] DEPT Provider Note   CSN: [id number] Arrival date & time: 12/24/15  1534  By signing my name below, I, Rayna Sexton, attest that this documentation has been prepared under the direction and in the presence of Virgel Manifold, MD. Electronically Signed: Rayna Sexton, ED Scribe. 12/24/15. 4:35 PM.   History   Chief Complaint Chief Complaint  Patient presents with  . Diarrhea    HPI HPI Comments: IDELLAR STOCKS is a 65 y.o. female who presents to the Emergency Department complaining of moderate productive cough with grey sputum x 1 day. She reports associated bilateral ear pain, left foot pain and mild SOB that worsens with exertion noting "every time I take a few steps I feel like I could fall flat on my face" as well as one episode of CP last night but denies any current CP. Pt denies worsening SOB when lying supine. Pt had one breathing treatment yesterday w/o significant relief. She also complains on persistent diarrhea x 5 days since receiving her last chemotherapy treatment for pancreatic cancer. Her diarrhea worsens s/p oral intake. Pt confirms a h/o a-fib and is on anticoagulation. Pt was a smoker and quit one month ago. She denies hemoptysis, leg pain and leg swelling.    The history is provided by the patient and medical records. No language interpreter was used.    Past Medical History:  Diagnosis Date  . Anginal pain ([place])    thought she was having   heartburn  . Arthritis    bursitis , fibromyalgia  . Arthritis    "right hip" (11/27/2015)  . Asthma   . Atrial fibrillation ([place])   . Chronic bronchitis ([place])   . Chronic right hip pain   . COPD (chronic obstructive pulmonary disease) ([place])    wears home o2 prn  . Daily headache    "last 2-3 months" (11/27/2015)  . Exposure to TB    "mom had it when she was pregnant with me"  . Fibromyalgia   . GERD (gastroesophageal reflux disease)    use to have it but not now  . Heart murmur    40-  9 years old  .  Hypertension   . On home oxygen therapy    11/27/2015 "whenever I need it; don't know how many liters"  . Pancreatic cancer ([place])   . Pneumonia    "several times" (11/27/2015)  . Pneumothorax     Patient Active Problem List   Diagnosis Date Noted  . Pancreatic cancer ([place]) 12/07/2015  . Epigastric pain   . Abdominal pain 11/27/2015  . Pancreatic mass 11/27/2015  . Hypokalemia 11/27/2015  . Leukocytosis 11/27/2015  . Chronic anticoagulation-Xarelto 09/06/2015  . Gastrointestinal hemorrhage with melena 08/08/2015  . Acute blood loss anemia 08/08/2015  . Chronic obstructive pulmonary disease (COPD) ([place]) 08/08/2015  . Chest pain 08/08/2015  . Dyspnea   . Elevated troponin I level 07/17/2015  . Hyperglycemia 07/17/2015  . Anemia 07/17/2015  . Atrial fibrillation ([place]) 07/17/2015  . Atrial flutter ([place]) 06/27/2015  . Atrial fibrillation with RVR ([place]) 06/27/2015  . COPD with exacerbation ([place]) 06/27/2015  . Hypertensive urgency 06/27/2015  . Tobacco abuse 06/27/2015  . Elevated troponin 06/27/2015  . CAP (community acquired pneumonia)   . OSTEOPOROSIS 04/10/2009  . WEIGHT LOSS 04/10/2009    Past Surgical History:  Procedure Laterality Date  . ANTERIOR CERVICAL DECOMP/DISCECTOMY FUSION     "put 4 screws in"  . BACK SURGERY    . CARDIAC CATHETERIZATION  08/2004   /  notes 06/18/2010  . CARDIOVERSION N/A 07/18/2015   Procedure: CARDIOVERSION;  Surgeon: Josue Hector, MD;  [facility];  Service: Cardiovascular;  Laterality: N/A;  . ESOPHAGOGASTRODUODENOSCOPY N/A 08/09/2015   Procedure: ESOPHAGOGASTRODUODENOSCOPY (EGD);  Surgeon: Rogene Houston, MD;  Location[facility];  Service: Endoscopy;  Laterality: N/A;  . EUS N/A 11/29/2015   Procedure: UPPER ENDOSCOPIC ULTRASOUND (EUS) RADIAL;  Surgeon: Milus Banister, MD;  Location[facility] END[facility];  Service: Endoscopy;  Laterality: N/A;  . IR GENERIC HISTORICAL  12/18/2015   IR US GUIDE VASC ACCESS RIGHT 12/18/2015 Sandi Mariscal, MD [facility]-INTER[facility] RAD  . IR GENERIC HISTORICAL  12/18/2015   IR FLUORO GUIDE PORT INSERTION RIGHT 12/18/2015 Sandi Mariscal, MD [facility]-INTER[facility]  . TONSILLECTOMY    . TUBAL LIGATION      OB History    Gravida Para Term Preterm AB Living   2         2   SAB TAB Ectopic Multiple Live Births                   Home Medications    Prior to Admission medications   Medication Sig Start Date End Date Taking? Authorizing Provider  albuterol (PROVENTIL) (2.5 MG/3ML) 0.083% nebulizer solution Take 3 mLs (2.5 mg total) by nebulization every 6 (six) hours as needed for wheezing or shortness of breath. 12/03/15   Ripudeep Krystal Eaton, MD  benzonatate (TESSALON) 100 MG capsule Take 1 capsule (100 mg total) by mouth 3 (three) times daily. 12/03/15   Ripudeep Krystal Eaton, MD  COMBIVENT RESPIMAT 20-100 MCG/ACT AERS respimat Inhale 1 puff into the lungs 2 (two) times daily. 12/03/15   Ripudeep Krystal Eaton, MD  diltiazem (CARDIZEM CD) 240 MG 24 hr capsule Take 1 capsule (240 mg total) by mouth daily. 12/03/15   Ripudeep Krystal Eaton, MD  doxycycline (VIBRA-TABS) 100 MG tablet Take 1 tablet (100 mg total) by mouth 2 (two) times daily. X 7days 12/03/15   Ripudeep Krystal Eaton, MD  escitalopram (LEXAPRO) 10 MG tablet Take 1 tablet (10 mg total) by mouth every evening. 12/03/15   Ripudeep Krystal Eaton, MD  furosemide (LASIX) 20 MG tablet Take 1 tablet (20 mg total) by mouth daily as needed for edema. Patient taking differently: Take 20 mg by mouth daily as needed for fluid or edema.  07/06/15   Arnoldo Lenis, MD  Gemcitabine HCl (GEMZAR IV) Inject into the vein. Day 1, day 8, day 15, every 28 days    Historical Provider, MD  HYDROcodone-acetaminophen (NORCO/VICODIN) 5-325 MG tablet Take 1 tablet by mouth every 6 (six) hours as needed for moderate pain (Must last 30 days.Do not take and drive a car or use machinery.). 12/13/15   Sanjuana Kava, MD  ipratropium-albuterol (DUONEB) 0.5-2.5 (3) MG/3ML SOLN Inhale 3 mLs into the lungs every 6 (six) hours as  needed (for shortness of breath). 12/03/15   Ripudeep Krystal Eaton, MD  lidocaine-prilocaine (EMLA) cream Apply to affected area once 12/10/15   Patrici Ranks, MD  metoprolol tartrate 37.5 MG TABS Take 37.5 mg by mouth 2 (two) times daily. 12/03/15   Ripudeep Krystal Eaton, MD  nicotine (NICODERM CQ - DOSED IN MG/24 HOURS) 14 mg/24hr patch Place 1 patch (14 mg total) onto the skin daily. 12/03/15   Ripudeep Krystal Eaton, MD  ondansetron (ZOFRAN) 8 MG tablet Take 1 tablet (8 mg total) by mouth 2 (two) times daily as needed (Nausea or vomiting). 12/10/15   Kelby Fam  Penland, MD  PACLitaxel Protein-Bound Part (ABRAXANE IV) Inject into the vein. Day 1, day 8, day 15, every 28 days    Historical Provider, MD  pantoprazole (PROTONIX) 40 MG tablet Take 1 tablet (40 mg total) by mouth daily. 12/03/15   Ripudeep Krystal Eaton, MD  prochlorperazine (COMPAZINE) 10 MG tablet Take 1 tablet (10 mg total) by mouth every 6 (six) hours as needed (Nausea or vomiting). 12/10/15   Patrici Ranks, MD  rivaroxaban (XARELTO) 20 MG TABS tablet Take 1 tablet (20 mg total) by mouth daily with supper. 07/19/15   Reyne Dumas, MD  SYMBICORT 160-4.5 MCG/ACT inhaler Inhale 1 puff into the lungs 2 (two) times daily. 12/03/15   Ripudeep Krystal Eaton, MD    Family History Family History  Problem Relation Age of Onset  . COPD Mother   . Diabetes Father   . Stroke Father     Social History Social History  Substance Use Topics  . Smoking status: Former Smoker    Packs/day: 0.50    Years: 46.00    Types: Cigarettes    Start date: 10/09/1969    Quit date: 11/29/2015  . Smokeless tobacco: Never Used  . Alcohol use No     Comment: quit 30 years ago     Allergies   Ibuprofen; Penicillins; Tiotropium bromide monohydrate; and Tramadol   Review of Systems Review of Systems  HENT: Positive for congestion and ear pain.   Respiratory: Positive for cough and shortness of breath.   Cardiovascular: Negative for chest pain and leg swelling.    Gastrointestinal: Positive for diarrhea. Negative for constipation.  Musculoskeletal: Positive for myalgias.  All other systems reviewed and are negative.  Physical Exam Updated Vital Signs Ht 5\' 3"  (1.6 m)   Wt 126 lb (57.2 kg)   BMI 22.32 kg/m   Physical Exam  Constitutional: She is oriented to person, place, and time. She appears well-developed and well-nourished. No distress.  HENT:  Head: Normocephalic and atraumatic.  Eyes: EOM are normal.  Neck: Normal range of motion.  Cardiovascular: Normal rate, regular rhythm and normal heart sounds.   Pulmonary/Chest: Effort normal. She has wheezes (expiratory biltarally).  Port cath noted in the right chest  Abdominal: Soft. She exhibits no distension. There is no tenderness.  Musculoskeletal: Normal range of motion.  Neurological: She is alert and oriented to person, place, and time.  Skin: Skin is warm and dry.  Psychiatric: She has a normal mood and affect. Judgment normal.  Nursing note and vitals reviewed.  ED Treatments / Results  Labs (all labs ordered are listed, but only abnormal results are displayed) Labs Reviewed - No data to display  EKG  EKG Interpretation None       Radiology No results found.  Procedures Procedures  COORDINATION OF CARE: 4:34 PM Discussed next steps with pt. Pt verbalized understanding and is agreeable with the plan.    Medications Ordered in ED Medications - No data to display   Initial Impression / Assessment and Plan / ED Course  I have reviewed the triage vital signs and the nursing notes.  Pertinent labs & imaging results that were available during my care of the patient were reviewed by me and considered in my medical decision making (see chart for details).  Clinical Course     65yF with cough and dyspnea. CT w/o PE but possible pneumonia. I feel she is appropriate for outpt tx. It has been determined that no acute conditions requiring further emergency  intervention are  present at this time. The patient has been advised of the diagnosis and plan. I reviewed any labs and imaging including any potential incidental findings. We have discussed signs and symptoms that warrant return to the ED and they are listed in the discharge instructions.   Final Clinical Impressions(s) / ED Diagnoses   Final diagnoses:  Atypical pneumonia    New Prescriptions New Prescriptions   No medications on file    I personally preformed the services scribed in my presence. The recorded information has been reviewed is accurate. Virgel Manifold, MD.    Virgel Manifold, MD 12/24/15 2122

## 2015-12-24 NOTE — ED Notes (Signed)
RT paged.

## 2015-12-26 ENCOUNTER — Encounter (HOSPITAL_BASED_OUTPATIENT_CLINIC_OR_DEPARTMENT_OTHER): Payer: Medicare HMO | Admitting: Oncology

## 2015-12-26 ENCOUNTER — Encounter (HOSPITAL_BASED_OUTPATIENT_CLINIC_OR_DEPARTMENT_OTHER): Payer: Medicare HMO

## 2015-12-26 ENCOUNTER — Other Ambulatory Visit (HOSPITAL_COMMUNITY): Payer: Medicare HMO

## 2015-12-26 ENCOUNTER — Encounter (HOSPITAL_COMMUNITY): Payer: Self-pay | Admitting: Oncology

## 2015-12-26 VITALS — BP 103/76 | HR 53 | Temp 98.2°F | Resp 20 | Wt 127.3 lb

## 2015-12-26 DIAGNOSIS — C251 Malignant neoplasm of body of pancreas: Secondary | ICD-10-CM | POA: Diagnosis not present

## 2015-12-26 DIAGNOSIS — Z95828 Presence of other vascular implants and grafts: Secondary | ICD-10-CM

## 2015-12-26 DIAGNOSIS — N179 Acute kidney failure, unspecified: Secondary | ICD-10-CM

## 2015-12-26 DIAGNOSIS — E876 Hypokalemia: Secondary | ICD-10-CM

## 2015-12-26 LAB — COMPREHENSIVE METABOLIC PANEL
ALBUMIN: 3.4 g/dL — AB (ref 3.5–5.0)
ALK PHOS: 71 U/L (ref 38–126)
ALT: 19 U/L (ref 14–54)
AST: 23 U/L (ref 15–41)
Anion gap: 12 (ref 5–15)
BILIRUBIN TOTAL: 0.2 mg/dL — AB (ref 0.3–1.2)
BUN: 39 mg/dL — AB (ref 6–20)
CALCIUM: 8.8 mg/dL — AB (ref 8.9–10.3)
CO2: 19 mmol/L — ABNORMAL LOW (ref 22–32)
Chloride: 103 mmol/L (ref 101–111)
Creatinine, Ser: 1.5 mg/dL — ABNORMAL HIGH (ref 0.44–1.00)
GFR calc Af Amer: 40 mL/min — ABNORMAL LOW (ref >60)
GFR, EST NON AFRICAN AMERICAN: 35 mL/min — AB (ref >60)
GLUCOSE: 137 mg/dL — AB (ref 65–99)
Potassium: 3.3 mmol/L — ABNORMAL LOW (ref 3.5–5.1)
Sodium: 134 mmol/L — ABNORMAL LOW (ref 135–145)
TOTAL PROTEIN: 6.7 g/dL (ref 6.5–8.1)

## 2015-12-26 LAB — CBC WITH DIFFERENTIAL/PLATELET
BASOS ABS: 0 10*3/uL (ref 0.0–0.1)
BASOS PCT: 0 %
Eosinophils Absolute: 0 10*3/uL (ref 0.0–0.7)
Eosinophils Relative: 0 %
HEMATOCRIT: 29.4 % — AB (ref 36.0–46.0)
HEMOGLOBIN: 9.1 g/dL — AB (ref 12.0–15.0)
LYMPHS PCT: 9 %
Lymphs Abs: 0.8 10*3/uL (ref 0.7–4.0)
MCH: 24.8 pg — ABNORMAL LOW (ref 26.0–34.0)
MCHC: 31 g/dL (ref 30.0–36.0)
MCV: 80.1 fL (ref 78.0–100.0)
MONO ABS: 0.2 10*3/uL (ref 0.1–1.0)
MONOS PCT: 2 %
NEUTROS ABS: 8 10*3/uL — AB (ref 1.7–7.7)
NEUTROS PCT: 89 %
Platelets: 151 10*3/uL (ref 150–400)
RBC: 3.67 MIL/uL — ABNORMAL LOW (ref 3.87–5.11)
RDW: 18.5 % — AB (ref 11.5–15.5)
WBC: 9 10*3/uL (ref 4.0–10.5)

## 2015-12-26 MED ORDER — HYDROCODONE-ACETAMINOPHEN 5-325 MG PO TABS
1.0000 | ORAL_TABLET | Freq: Once | ORAL | Status: AC
  Administered 2015-12-26: 1 via ORAL

## 2015-12-26 MED ORDER — HEPARIN SOD (PORK) LOCK FLUSH 100 UNIT/ML IV SOLN
500.0000 [IU] | Freq: Once | INTRAVENOUS | Status: AC
  Administered 2015-12-26: 500 [IU] via INTRAVENOUS

## 2015-12-26 MED ORDER — HYDROCODONE-ACETAMINOPHEN 5-325 MG PO TABS
ORAL_TABLET | ORAL | Status: AC
  Filled 2015-12-26: qty 1

## 2015-12-26 MED ORDER — POTASSIUM CHLORIDE CRYS ER 20 MEQ PO TBCR
40.0000 meq | EXTENDED_RELEASE_TABLET | Freq: Once | ORAL | Status: AC
  Administered 2015-12-26: 40 meq via ORAL
  Filled 2015-12-26: qty 2

## 2015-12-26 MED ORDER — AZITHROMYCIN 250 MG PO TABS: ORAL_TABLET | ORAL | 0 refills | Status: DC

## 2015-12-26 MED ORDER — HEPARIN SOD (PORK) LOCK FLUSH 100 UNIT/ML IV SOLN
INTRAVENOUS | Status: AC
  Filled 2015-12-26: qty 5

## 2015-12-26 MED ORDER — SODIUM CHLORIDE 0.9 % IV SOLN
INTRAVENOUS | Status: DC
  Administered 2015-12-26: 12:00:00 via INTRAVENOUS

## 2015-12-26 NOTE — Patient Instructions (Addendum)
[place] at [facility] Discharge Instructions  RECOMMENDATIONS MADE BY THE CONSULTANT AND ANY TEST RESULTS WILL BE SENT TO YOUR REFERRING PHYSICIAN.  You were seen today by Kirby Crigler PA-C. Try Imodium for loose stools. Rx for Zpak sent to your pharmacy.  Stop taking the Levaquin. Return as scheduled next week for treatment and follow up.    Thank you for choosing [place] at [facility] to provide your oncology and hematology care.  To afford each patient quality time with our provider, please arrive at least 15 minutes before your scheduled appointment time.   Beginning January 23rd 2017 lab work for the [facility] will be done in the  Main lab at [facility] on 1st floor. If you have a lab appointment with the [place] please come in thru the  Main Entrance and check in at the main information desk  You need to re-schedule your appointment should you arrive 10 or more minutes late.  We strive to give you quality time with our providers, and arriving late affects you and other patients whose appointments are after yours.  Also, if you no show three or more times for appointments you may be dismissed from the clinic at the providers discretion.     Again, thank you for choosing [facility].  Our hope is that these requests will decrease the amount of time that you wait before being seen by our physicians.       _____________________________________________________________  Should you have questions after your visit to [facility], please contact our office at [phone] between the hours of 8:30 a.m. and 4:30 p.m.  Voicemails left after 4:30 p.m. will not be returned until the following business day.  For prescription refill requests, have your pharmacy contact our office.         Resources For Cancer Patients and their Caregivers ? American Cancer Society: Can assist with transportation, wigs,  general needs, runs Look Good Feel Better.        9040385842 ? Cancer Care: Provides financial assistance, online support groups, medication/co-pay assistance.  1-800-813-HOPE 315-042-9051) ? [place] Assists [place] cancer patients and their families through emotional , educational and financial support.  239 745 7443 ? [place] DSS Where to apply for food stamps, Medicaid and utility assistance. 8138872721 ? [employer]: Transportation to medical appointments. 402-400-4345 ? Social Security Administration: May apply for disability if have a Stage IV cancer. (754) 269-5851 2796036201 ? [employer], Disability and Transit Services: Assists with nutrition, care and transit needs. [place] Support Programs: @10RELATIVEDAYS @ > Cancer Support Group  2nd Tuesday of the month 1pm-2pm, Journey Room  > Creative Journey  3rd Tuesday of the month 1130am-1pm, Journey Room  > Look Good Feel Better  1st Wednesday of the month 10am-12 noon, Journey Room (Call [place] to register [phone])

## 2015-12-26 NOTE — Assessment & Plan Note (Addendum)
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 Ardis Hughs on 11/29/2015 with FNA demonstrating adenocarcinoma.  CA 19-9 elevated at the initiation of treatment at 197.  Started on Gemcitabine/Abraxane beginning on 12/19/2015 in a day 1, 8, 15 every 28 day fashion.  Oncology history developed.  Pre-treatment labs today: CBC diff, CMET.  I personally reviewed and went over laboratory results with the patient.  The results are noted within this dictation. Acute renal changes noted.  Hypokalemia noted.  Treatment will be deferred today due to acute renal changes.  I have discussed the patient's case with Dr. Jerilee Hoh [facility]).  She does not feel hospitalization is warranted and recommended IV fluids, change in antibiotic, and repeat labs next week.  Rx printed for Z-Pak.  Will give 1 L NS today.    Kdur 40 mEq PO today in the clinic.  She tolerated her first treatment well.  She noted some loose stools that have since resolved lasting 48-72 hours.  She used Pepto-Bismol for this issue.  I have recommended OTC Imodium as another option for management of this side effect.  She requests a pain pill for her hip pain.  Order is placed for Hydrocodone.  Return next week for day #8 of treatment and follow-up.

## 2015-12-26 NOTE — Patient Instructions (Signed)
[place] at [facility] Discharge Instructions  RECOMMENDATIONS MADE BY THE CONSULTANT AND ANY TEST RESULTS WILL BE SENT TO YOUR REFERRING PHYSICIAN.  One liter of IVF given per orders Follow up as scheduled.  Thank you for choosing [place] at [facility] to provide your oncology and hematology care.  To afford each patient quality time with our provider, please arrive at least 15 minutes before your scheduled appointment time.   Beginning January 23rd 2017 lab work for the [facility] will be done in the  Main lab at [facility] on 1st floor. If you have a lab appointment with the [place] please come in thru the  Main Entrance and check in at the main information desk  You need to re-schedule your appointment should you arrive 10 or more minutes late.  We strive to give you quality time with our providers, and arriving late affects you and other patients whose appointments are after yours.  Also, if you no show three or more times for appointments you may be dismissed from the clinic at the providers discretion.     Again, thank you for choosing [facility].  Our hope is that these requests will decrease the amount of time that you wait before being seen by our physicians.       _____________________________________________________________  Should you have questions after your visit to [facility], please contact our office at [phone] between the hours of 8:30 a.m. and 4:30 p.m.  Voicemails left after 4:30 p.m. will not be returned until the following business day.  For prescription refill requests, have your pharmacy contact our office.         Resources For Cancer Patients and their Caregivers ? American Cancer Society: Can assist with transportation, wigs, general needs, runs Look Good Feel Better.        ([phone] ? Cancer Care: Provides financial assistance, online support groups,  medication/co-pay assistance.  1-800-813-HOPE 614-557-8807) ? [place] Assists [place] cancer patients and their families through emotional , educational and financial support.  (201)837-6987 ? [place] DSS Where to apply for food stamps, Medicaid and utility assistance. 209-427-8716 ? [employer]: Transportation to medical appointments. (940) 018-9813 ? Social Security Administration: May apply for disability if have a Stage IV cancer. (670)194-2677 240-470-3513 ? [employer], Disability and Transit Services: Assists with nutrition, care and transit needs. [place] Support Programs: @10RELATIVEDAYS @ > Cancer Support Group  2nd Tuesday of the month 1pm-2pm, Journey Room  > Creative Journey  3rd Tuesday of the month 1130am-1pm, Journey Room  > Look Good Feel Better  1st Wednesday of the month 10am-12 noon, Journey Room (Call [place] to register [phone])

## 2015-12-26 NOTE — Progress Notes (Signed)
Labs reviewed by MD, will defer treatment for one week. Patient is going to be admitted to hospital per MD.  One liter of IVF given per orders today. Vitals stable and discharged from clinic via wheelchair.follow up as scheduled.

## 2015-12-26 NOTE — Progress Notes (Signed)
Lindsay Fairy, PA-C [address] [place] [address]  Malignant neoplasm of body of pancreas ([place]) - Plan: HYDROcodone-acetaminophen (NORCO/VICODIN) 5-325 MG per tablet 1 tablet  CURRENT THERAPY: Gemcitabine/Abraxane day 1, 8, 15 every 28 days beginning on 12/19/2015 in the neoadjuvant setting.  INTERVAL HISTORY: Lindsay Salazar 65 y.o. 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 Ardis Hughs on 11/29/2015 with FNA demonstrating adenocarcinoma.  CA 19-9 elevated at the initiation of treatment at 197.  Started on Gemcitabine/Abraxane beginning on 12/19/2015 in a day 1, 8, 15 every 28 day fashion.    Pancreatic cancer ([place])   11/29/2015 Procedure    EUS by Dr. Ardis Hughs- 4.2cm mass in the body of pancreas causing main pancreatic duct obstruction (and dilation) and focally involving the portosplenic vein confluence. The mass does not involve the SMA, celiac trunk.      11/30/2015 Pathology Results    FINE NEEDLE ASPIRATION, ENDOSCOPIC, PANCREAS BODY (SPECIMEN 1 OF 1 COLLECTED 11/30/15): MALIGNANT CELLS CONSISTENT WITH ADENOCARCINOMA.      12/18/2015 Procedure    Successful placement of right IJ approach port-a-cath with tip at the superior caval atrial junction by IR      12/19/2015 -  Chemotherapy    The patient had PACLitaxel-protein bound (ABRAXANE) chemo infusion 200 mg, 125 mg/m2 = 200 mg, Intravenous,  Once, 1 of 4 cycles  gemcitabine (GEMZAR) 1,634 mg in sodium chloride 0.9 % 250 mL chemo infusion, 1,000 mg/m2 = 1,634 mg, Intravenous,  Once, 1 of 4 cycles  for chemotherapy treatment.        12/19/2015 Tumor Marker    Patient's tumor was tested for the following markers: CA 19-9. Results of the tumor marker test revealed 197.      12/24/2015 Imaging    CT angio chest- 1. No evidence of pulmonary embolus. 2. Mild scattered  bilateral peribronchovascular opacity within the lungs, more nodular at the right middle lobe, likely reflects an atypical infectious process.        She tolerated her first treatment with only loose stools lasting 48-72 hours.  It has since resolved.  She notes 3-4 loose stools per day.  Her baseline is 2 per day.  She denies any nausea or vomiting.  She notes a solid appetite, which has actually improved since her first treatment.  Weight is stable.  Review of Systems  Constitutional: Negative for chills, fever, malaise/fatigue and weight loss.  HENT: Negative.   Eyes: Negative.   Respiratory: Negative.  Negative for cough.   Cardiovascular: Negative.  Negative for chest pain.  Gastrointestinal: Negative.  Negative for constipation, diarrhea, nausea and vomiting.  Genitourinary: Negative.   Musculoskeletal: Negative.   Skin: Negative.   Neurological: Negative.  Negative for weakness.  Endo/Heme/Allergies: Negative.     Past Medical History:  Diagnosis Date  . Anginal pain ([place])    thought she was having   heartburn  . Arthritis    bursitis , fibromyalgia  . Arthritis    "right hip" (11/27/2015)  . Asthma   . Atrial fibrillation ([place])   . Chronic bronchitis ([place])   . Chronic right hip pain   . COPD (chronic obstructive pulmonary disease) ([place])    wears home o2 prn  . Daily headache    "last 2-3 months" (11/27/2015)  . Exposure to TB    "mom  had it when she was pregnant with me"  . Fibromyalgia   . GERD (gastroesophageal reflux disease)    use to have it but not now  . Heart murmur    11-  60 years old  . Hypertension   . On home oxygen therapy    11/27/2015 "whenever I need it; don't know how many liters"  . Pancreatic cancer ([place])   . Pneumonia    "several times" (11/27/2015)  . Pneumothorax     Past Surgical History:  Procedure Laterality Date  . ANTERIOR CERVICAL DECOMP/DISCECTOMY FUSION     "put 4 screws in"  . BACK SURGERY    . CARDIAC CATHETERIZATION   08/2004   Archie Endo 06/18/2010  . CARDIOVERSION N/A 07/18/2015   Procedure: CARDIOVERSION;  Surgeon: Josue Hector, MD;  Location: [facility];  Service: Cardiovascular;  Laterality: N/A;  . ESOPHAGOGASTRODUODENOSCOPY N/A 08/09/2015   Procedure: ESOPHAGOGASTRODUODENOSCOPY (EGD);  Surgeon: Rogene Houston, MD;  Location: [facility];  Service: Endoscopy;  Laterality: N/A;  . EUS N/A 11/29/2015   Procedure: UPPER ENDOSCOPIC ULTRASOUND (EUS) RADIAL;  Surgeon: Milus Banister, MD;  Location: [facility];  Service: Endoscopy;  Laterality: N/A;  . IR GENERIC HISTORICAL  12/18/2015   IR US GUIDE VASC ACCESS RIGHT 12/18/2015 Sandi Mariscal, MD [facility]  . IR GENERIC HISTORICAL  12/18/2015   IR FLUORO GUIDE PORT INSERTION RIGHT 12/18/2015 Sandi Mariscal, MD [facility]  . TONSILLECTOMY    . TUBAL LIGATION      Family History  Problem Relation Age of Onset  . COPD Mother   . Diabetes Father   . Stroke Father     Social History   Social History  . Marital status: Divorced    Spouse name: N/A  . Number of children: N/A  . Years of education: N/A   Occupational History  . retired    Social History Main Topics  . Smoking status: Former Smoker    Packs/day: 0.50    Years: 46.00    Types: Cigarettes    Start date: 10/09/1969    Quit date: 11/29/2015  . Smokeless tobacco: Never Used  . Alcohol use No     Comment: quit 30 years ago  . Drug use: No  . Sexual activity: Not Currently    Birth control/ protection: Surgical, Post-menopausal   Other Topics Concern  . Not on file   Social History Narrative  . No narrative on file     PHYSICAL EXAMINATION  ECOG PERFORMANCE STATUS: 1 - Symptomatic but completely ambulatory  There were no vitals filed for this visit.  Vitals - 1 value per visit 25/85/2778  SYSTOLIC 242  DIASTOLIC 76  Pulse 53  Temperature 98.2  Respirations 20  Weight (lb) 127.3    GENERAL:alert, no distress, well nourished, well developed, comfortable,  cooperative, smiling and chronically ill appearing, unaccompanied, in chemo-recliner, bathed prior to admittance to the clinic and change of clothes. SKIN: skin color, texture, turgor are normal, no rashes or significant lesions HEAD: Normocephalic, No masses, lesions, tenderness or abnormalities, facial hair noted EYES: normal, EOMI, Conjunctiva are pink and non-injected EARS: External ears normal OROPHARYNX:lips, buccal mucosa, and tongue normal and mucous membranes are moist  NECK: supple, trachea midline LYMPH:  not examined BREAST:not examined LUNGS: expiratory wheezes bilaterally HEART: regular rate & rhythm, no murmurs and no gallops ABDOMEN:abdomen soft and normal bowel sounds BACK: Back symmetric, no curvature. EXTREMITIES:less then 2 second capillary refill, no joint deformities, effusion, or  inflammation, no skin discoloration  NEURO: alert & oriented x 3 with fluent speech, no focal motor/sensory deficits, gait normal   LABORATORY DATA: CBC    Component Value Date/Time   WBC 6.6 12/24/2015 1612   RBC 3.84 (L) 12/24/2015 1612   HGB 9.6 (L) 12/24/2015 1612   HCT 30.6 (L) 12/24/2015 1612   PLT 175 12/24/2015 1612   MCV 79.7 12/24/2015 1612   MCH 25.0 (L) 12/24/2015 1612   MCHC 31.4 12/24/2015 1612   RDW 18.2 (H) 12/24/2015 1612   LYMPHSABS 2.6 12/18/2015 1154   MONOABS 0.7 12/18/2015 1154   EOSABS 0.1 12/18/2015 1154   BASOSABS 0.0 12/18/2015 1154      Chemistry      Component Value Date/Time   NA 135 12/24/2015 1612   K 3.3 (L) 12/24/2015 1612   CL 104 12/24/2015 1612   CO2 24 12/24/2015 1612   BUN 11 12/24/2015 1612   CREATININE 0.81 12/24/2015 1612      Component Value Date/Time   CALCIUM 8.4 (L) 12/24/2015 1612   ALKPHOS 72 12/24/2015 1612   AST 14 (L) 12/24/2015 1612   ALT 16 12/24/2015 1612   BILITOT 0.8 12/24/2015 1612        PENDING LABS:   RADIOGRAPHIC STUDIES:  Dg Chest 2 View  Result Date: 12/24/2015 CLINICAL DATA:  65 year old  female with shortness of breath and productive cough for the past few days. First chemotherapy last Wednesday for pancreatic cancer. Hypertension. Asthma. Former smoker. Initial encounter. EXAM: CHEST  2 VIEW COMPARISON:  11/29/2015 and 07/17/2015 chest x-ray. 12/01/2015 chest CT. FINDINGS: Scarring lung apices similar to prior exams. No segmental consolidation or pulmonary edema. CT detected mediastinal adenopathy not appreciated on present plain film exam. Heart size within normal limits. Calcified aorta. MediPort catheter tip mid to distal superior vena cava. No pneumothorax. Postsurgical changes lower cervical spine. IMPRESSION: Scarring lung apices stable. No segmental consolidation or pulmonary edema. Aortic atherosclerosis. Electronically Signed   By: Genia Del M.D.   On: 12/24/2015 17:05   Ct Chest W Contrast  Result Date: 12/01/2015 CLINICAL DATA:  Chest/abdominal pain, pancreatic cancer EXAM: CT CHEST WITH CONTRAST TECHNIQUE: Multidetector CT imaging of the chest was performed during intravenous contrast administration. CONTRAST:  9m ISOVUE-300 IOPAMIDOL (ISOVUE-300) INJECTION 61% COMPARISON:  Partial comparison to CT abdomen pelvis dated 11/27/2015. FINDINGS: Cardiovascular: Heart is top-normal in size. Left atrial enlargement. No pericardial effusion. Atherosclerotic calcifications of the aortic arch. Mediastinum/Nodes: Mild mediastinal lymphadenopathy, including a 12 mm short axis low right paratracheal node (series 201/ image 20) and a 13 mm short axis subcarinal node (series 201/ image 29). 7 mm partially calcified right hilar node and 8 mm short axis left hilar node, within normal limits. Visualized thyroid is unremarkable. Lungs/Pleura: Biapical pleural-parenchymal scarring, right greater than left, with associated right apical calcification. Moderate centrilobular emphysematous changes, upper lobe predominant. Patchy bilateral lower lobe opacities, right greater than left, likely  reflecting atelectasis, although superimposed right lower lobe pneumonia is not excluded. Small bilateral pleural effusions. No suspicious pulmonary nodules. No pneumothorax. Upper Abdomen: Visualized upper abdomen is unchanged from recent CT, noting a dominant left hepatic lobe cystic and an incompletely visualized mass in the pancreatic tail (series 201/ image 102). Musculoskeletal: Mild degenerative changes of the upper thoracic spine. IMPRESSION: Patchy bilateral lower lobe opacities, likely reflecting atelectasis, superimposed right lower lobe pneumonia not excluded. Small bilateral pleural effusions. Mild mediastinal lymphadenopathy, possibly reactive. Given the abdominal findings, nodal metastases are not excluded. Consider PET-CT for  staging as clinically warranted. Electronically Signed   By: Julian Hy M.D.   On: 12/01/2015 11:16   Ct Angio Chest Pe W And/or Wo Contrast  Result Date: 12/24/2015 CLINICAL DATA:  Acute onset of cough and dyspnea on exertion. Recently diagnosed with pancreatic cancer, status post first round of chemotherapy. Initial encounter. EXAM: CT ANGIOGRAPHY CHEST WITH CONTRAST TECHNIQUE: Multidetector CT imaging of the chest was performed using the standard protocol during bolus administration of intravenous contrast. Multiplanar CT image reconstructions and MIPs were obtained to evaluate the vascular anatomy. CONTRAST:  100 mL of Isovue 370 IV contrast COMPARISON:  Chest radiograph performed earlier today at 4:46 p.m., and CT of the chest performed 12/01/2015 FINDINGS: Cardiovascular:  There is no evidence of pulmonary embolus. Mild left atrial enlargement is noted. Scattered calcification noted along the aortic arch and descending thoracic aorta. The great vessels are grossly unremarkable in appearance. Mediastinum/Nodes: Visualized mediastinal nodes remain normal in size. No pericardial effusion is seen. The thyroid gland is unremarkable. No axillary lymphadenopathy is  appreciated. Lungs/Pleura: Mild scattered bilateral peribronchovascular opacity, more nodular at the right middle lobe, likely reflects an atypical infectious process. Underlying bilateral emphysema is noted. No dominant mass is seen. No pleural effusion or pneumothorax is identified. Upper Abdomen: A 6.5 cm left hepatic cyst is noted. The liver is otherwise unremarkable. The spleen is within normal limits. The visualized portions of the adrenal glands and left kidney are within normal limits. A 3 mm stone is noted at the interpole region of the right kidney, and there is mild developmental irregularity of the right kidney. Scattered calcification is noted along the proximal abdominal aorta. The vaguely hypoattenuating mass at the distal body and tail of pancreas is significantly less prominent than on prior studies, though this may reflect the phase of contrast enhancement. Musculoskeletal: No acute osseous abnormalities are identified. Cervical spinal fusion hardware is partially imaged. The visualized musculature is unremarkable in appearance. Review of the MIP images confirms the above findings. IMPRESSION: 1. No evidence of pulmonary embolus. 2. Mild scattered bilateral peribronchovascular opacity within the lungs, more nodular at the right middle lobe, likely reflects an atypical infectious process. 3. Underlying bilateral emphysema noted. 4. Mild left atrial enlargement. 5. Large hepatic cyst seen. 6. 3 mm nonobstructing stone at the interpole region of the right kidney. 7. Scattered aortic atherosclerosis. 8. Known pancreatic mass is less well characterized than on prior studies, possibly reflecting the phase of contrast enhancement. Electronically Signed   By: Garald Balding M.D.   On: 12/24/2015 20:33   Mr Abdomen W Wo Contrast  Result Date: 11/28/2015 CLINICAL DATA:  65 year old female inpatient with abdominal pain and newly diagnosed pancreatic body mass on a CT study from 1 day prior. EXAM: MRI  ABDOMEN WITHOUT AND WITH CONTRAST TECHNIQUE: Multiplanar multisequence MR imaging of the abdomen was performed both before and after the administration of intravenous contrast. CONTRAST:  17m MULTIHANCE GADOBENATE DIMEGLUMINE 529 MG/ML IV SOLN COMPARISON:  11/27/2015 CT abdomen/pelvis. FINDINGS: Many of the sequences are motion degraded. Lower chest: Faint tiny clustered nodular opacities in the anterior left lower lobe (series 1404/ image 45), correlating with mild tree-in-bud opacities on the CT study from 1 day prior. Hepatobiliary: Normal liver size and configuration. Mild diffuse hepatic steatosis. There is a simple 6.7 x 6.5 cm posterior left liver lobe cyst. There is a simple 1.1 cm segment 6 right liver lobe cyst. There are innumerable (> than 30) subcentimeter T2 hyperintense nonenhancing lesions scattered throughout the liver  most consistent with biliary hamartomas. No suspicious liver masses. Normal gallbladder with no cholelithiasis. No biliary ductal dilatation. Common bile duct diameter 5 mm. No choledocholithiasis. Pancreas: There is an approximately 5.0 x 2.3 x 2.6 cm distal pancreatic body mass (series 8/ image 20), which demonstrates ill-defined infiltrative appearing margins and heterogeneous hypoenhancement, most consistent with a primary pancreatic adenocarcinoma. There is marked dilatation of the distal main pancreatic duct within the atrophic pancreatic tail (9 mm diameter). This pancreatic mass appears to encase the splenic artery, which remains patent. This pancreatic mass appears to abut the anterior aspect of the splenic vein, which is mildly narrowed and remains patent. This pancreatic mass appears to involve a small portion of the main portal vein wall at the portosplenic confluence, approximately 20-30% of the main portal vein circumference. Main portal vein remains normal in caliber and patent. Superior mesenteric artery appears uninvolved. No pancreas divisum. Spleen: Normal size. No  mass. Adrenals/Urinary Tract: Normal adrenals. No hydronephrosis. Simple 1.7 cm renal cyst in the posterior lower right kidney. Small simple parapelvic renal cysts in the right kidney. No suspicious renal masses. Stomach/Bowel: Grossly normal stomach. Visualized small and large bowel is normal caliber, with no bowel wall thickening. Vascular/Lymphatic: Atherosclerotic nonaneurysmal abdominal aorta. Patent hepatic, left and right portal and renal veins. No pathologically enlarged lymph nodes in the abdomen. Other: No abdominal ascites or focal fluid collection. Musculoskeletal: No aggressive appearing focal osseous lesions. IMPRESSION: 1. Infiltrative hypoenhancing 5.0 x 2.3 x 2.6 cm distal pancreatic body mass, most consistent with a primary pancreatic adenocarcinoma. Atrophy and prominent pancreatic duct dilation in the pancreatic tail. Recommend correlation with endoscopic ultrasound with fine-needle aspiration. 2. Vascular involvement by the pancreatic mass as follows: Encasement of the patent splenic artery. Mild narrowing and abutment of the splenic vein, which remains patent. Mild involvement of the main portal vein wall at the portosplenic confluence. Portal vein remains patent and normal in caliber. 3. No evidence of metastatic disease in the abdomen. 4. Faint tiny clustered nodular opacities in the anterior left lower lobe, correlating with mild tree-in-bud opacities on the CT study from 1 day prior, indicating a mild infectious or inflammatory bronchiolitis, with the differential including mild aspiration. 5. Additional findings include aortic atherosclerosis, mild diffuse hepatic steatosis and benign liver cysts and hamartomas. Electronically Signed   By: Ilona Sorrel M.D.   On: 11/28/2015 13:25   Ct Abdomen Pelvis W Contrast  Result Date: 11/27/2015 CLINICAL DATA:  Abdominal pain.  History of GI bleeding. EXAM: CT ABDOMEN AND PELVIS WITH CONTRAST TECHNIQUE: Multidetector CT imaging of the abdomen  and pelvis was performed using the standard protocol following bolus administration of intravenous contrast. CONTRAST:  100 cc Isovue-300 intravenous COMPARISON:  None. FINDINGS: Lower chest:  No nodules. Hepatobiliary: Simple appearing cysts in the inferior left liver measuring nearly 7 cm. Numerous other low-density areas are well-defined and likely cysts or hamartomas, but limited by small size. No definitive liver metastasis. Pancreas: 32 mm long, 25 mm thick hypoenhancing mass in the pancreatic body with ductal obstruction and parenchymal atrophy seen at the level of the tail. On sagittal reformats the mass encases the mid splenic artery and splenic vein with mild splenic vein narrowing 1-2 cm beyond the portal vein confluence. No infiltration seen around the celiac axis or SMA. No peripancreatic lymph nodes. Spleen: Unremarkable. Adrenals/Urinary Tract: Negative adrenals. No hydronephrosis or stone. Unremarkable bladder. Stomach/Bowel:  No obstruction. No appendicitis. Vascular/Lymphatic: No acute vascular abnormality. Splenic vascular findings described on pancreas section. Atherosclerosis.  No mass or adenopathy. Reproductive:No pathologic findings. Other: No ascites or peritoneal nodules. Musculoskeletal: No acute or aggressive finding IMPRESSION: Findings of pancreas body adenocarcinoma as described above. Recommend EUS/FNA. No definitive metastasis, but limited in the liver due to numerous sub-centimeter cysts/biliary hamartomas. Suggest liver staging with MRI. Electronically Signed   By: Monte Fantasia M.D.   On: 11/27/2015 15:09   Ir US Guide Vasc Access Right  Result Date: 12/18/2015 INDICATION: History of pancreatic cancer. In need of durable intravenous access for chemotherapy administration. EXAM: IMPLANTED PORT A CATH PLACEMENT WITH ULTRASOUND AND FLUOROSCOPIC GUIDANCE COMPARISON:  Chest CT - 12/01/2015 MEDICATIONS: Cleocin 900 mg IV; The antibiotic was administered within an appropriate time  interval prior to skin puncture. ANESTHESIA/SEDATION: Moderate (conscious) sedation was employed during this procedure. A total of Versed 4 mg and Fentanyl 150 mcg was administered intravenously. Moderate Sedation Time: 26 minutes. The patient's level of consciousness and vital signs were monitored continuously by radiology nursing throughout the procedure under my direct supervision. CONTRAST:  None FLUOROSCOPY TIME:  30 seconds (3.2 mGy) COMPLICATIONS: None immediate. PROCEDURE: The procedure, risks, benefits, and alternatives were explained to the patient. Questions regarding the procedure were encouraged and answered. The patient understands and consents to the procedure. The right neck and chest were prepped with chlorhexidine in a sterile fashion, and a sterile drape was applied covering the operative field. Maximum barrier sterile technique with sterile gowns and gloves were used for the procedure. A timeout was performed prior to the initiation of the procedure. Local anesthesia was provided with 1% lidocaine with epinephrine. After creating a small venotomy incision, a micropuncture kit was utilized to access the internal jugular vein under direct, real-time ultrasound guidance. Ultrasound image documentation was performed. The microwire was kinked to measure appropriate catheter length. A subcutaneous port pocket was then created along the upper chest wall utilizing a combination of sharp and blunt dissection. The pocket was irrigated with sterile saline. A single lumen thin power injectable port was chosen for placement. The 8 Fr catheter was tunneled from the port pocket site to the venotomy incision. The port was placed in the pocket. The external catheter was trimmed to appropriate length. At the venotomy, an 8 Fr peel-away sheath was placed over a guidewire under fluoroscopic guidance. The catheter was then placed through the sheath and the sheath was removed. Final catheter positioning was confirmed  and documented with a fluoroscopic spot radiograph. The port was accessed with a Huber needle, aspirated and flushed with heparinized saline. The venotomy site was closed with an interrupted 4-0 Vicryl suture. The port pocket incision was closed with interrupted 2-0 Vicryl suture and the skin was opposed with a running subcuticular 4-0 Vicryl suture. Dermabond and Steri-strips were applied to both incisions. Dressings were placed. The patient tolerated the procedure well without immediate post procedural complication. FINDINGS: After catheter placement, the tip lies within the superior cavoatrial junction. The catheter aspirates and flushes normally and is ready for immediate use. IMPRESSION: Successful placement of a right internal jugular approach power injectable Port-A-Cath. The catheter is ready for immediate use. Electronically Signed   By: Sandi Mariscal M.D.   On: 12/18/2015 14:58   Dg Chest Port 1 View  Result Date: 11/29/2015 CLINICAL DATA:  Shortness of breath. EXAM: PORTABLE CHEST 1 VIEW COMPARISON:  08/08/2015. FINDINGS: Mediastinum and hilar structures are normal. Heart size normal. Mild bibasilar atelectasis and/or infiltrates. Biapical pleural-parenchymal thickening noted consistent with scarring. No pleural effusion or pneumothorax. IMPRESSION: 1. Mild right  base subsegmental atelectasis. Mild right base infiltrate cannot be excluded. 2.  Biapical pleural-parenchymal scarring . Electronically Signed   By: [place]   On: 11/29/2015 07:57   Ir Cyndy Freeze Guide Port Insertion Right  Result Date: 12/18/2015 INDICATION: History of pancreatic cancer. In need of durable intravenous access for chemotherapy administration. EXAM: IMPLANTED PORT A CATH PLACEMENT WITH ULTRASOUND AND FLUOROSCOPIC GUIDANCE COMPARISON:  Chest CT - 12/01/2015 MEDICATIONS: Cleocin 900 mg IV; The antibiotic was administered within an appropriate time interval prior to skin puncture. ANESTHESIA/SEDATION: Moderate  (conscious) sedation was employed during this procedure. A total of Versed 4 mg and Fentanyl 150 mcg was administered intravenously. Moderate Sedation Time: 26 minutes. The patient's level of consciousness and vital signs were monitored continuously by radiology nursing throughout the procedure under my direct supervision. CONTRAST:  None FLUOROSCOPY TIME:  30 seconds (3.2 mGy) COMPLICATIONS: None immediate. PROCEDURE: The procedure, risks, benefits, and alternatives were explained to the patient. Questions regarding the procedure were encouraged and answered. The patient understands and consents to the procedure. The right neck and chest were prepped with chlorhexidine in a sterile fashion, and a sterile drape was applied covering the operative field. Maximum barrier sterile technique with sterile gowns and gloves were used for the procedure. A timeout was performed prior to the initiation of the procedure. Local anesthesia was provided with 1% lidocaine with epinephrine. After creating a small venotomy incision, a micropuncture kit was utilized to access the internal jugular vein under direct, real-time ultrasound guidance. Ultrasound image documentation was performed. The microwire was kinked to measure appropriate catheter length. A subcutaneous port pocket was then created along the upper chest wall utilizing a combination of sharp and blunt dissection. The pocket was irrigated with sterile saline. A single lumen thin power injectable port was chosen for placement. The 8 Fr catheter was tunneled from the port pocket site to the venotomy incision. The port was placed in the pocket. The external catheter was trimmed to appropriate length. At the venotomy, an 8 Fr peel-away sheath was placed over a guidewire under fluoroscopic guidance. The catheter was then placed through the sheath and the sheath was removed. Final catheter positioning was confirmed and documented with a fluoroscopic spot radiograph. The port  was accessed with a Huber needle, aspirated and flushed with heparinized saline. The venotomy site was closed with an interrupted 4-0 Vicryl suture. The port pocket incision was closed with interrupted 2-0 Vicryl suture and the skin was opposed with a running subcuticular 4-0 Vicryl suture. Dermabond and Steri-strips were applied to both incisions. Dressings were placed. The patient tolerated the procedure well without immediate post procedural complication. FINDINGS: After catheter placement, the tip lies within the superior cavoatrial junction. The catheter aspirates and flushes normally and is ready for immediate use. IMPRESSION: Successful placement of a right internal jugular approach power injectable Port-A-Cath. The catheter is ready for immediate use. Electronically Signed   By: Sandi Mariscal M.D.   On: 12/18/2015 14:58   US Abdomen Limited Ruq  Result Date: 11/27/2015 CLINICAL DATA:  One day of abdominal pain. EXAM: US ABDOMEN LIMITED - RIGHT UPPER QUADRANT COMPARISON:  CT of earlier today. FINDINGS: Gallbladder: No gallstones or wall thickening visualized. No sonographic Murphy sign noted by sonographer. Common bile duct: Diameter: Normal, 6 mm. Liver: 8.0 cm left hepatic lobe cyst, as detailed on CT. This has minimal complexity within. IMPRESSION: Normal gallbladder and biliary tree. Left hepatic lobe complex cyst. Electronically Signed   By: Adria Devon.D.  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 Ardis Hughs on 11/29/2015 with FNA demonstrating adenocarcinoma.  CA 19-9 elevated at the initiation of treatment at 197.  Started on Gemcitabine/Abraxane beginning on 12/19/2015 in a day 1, 8, 15 every 28 day fashion.  Oncology history developed.  Pre-treatment labs today: CBC diff, CMET.  I personally  reviewed and went over laboratory results with the patient.  The results are noted within this dictation.  If labs meet parameters for treatment, we will treat as planned with cycle 1, day 8.  She tolerated her first treatment well.  She noted some loose stools that have since resolved lasting 48-72 hours.  She used Pepto-Bismol for this issue.  I have recommended OTC Imodium as another option for management of this side effect.  She requests a pain pill for her hip pain.  Order is placed for Hydrocodone.  Return next week for day #15 of treatment and follow-up.   ORDERS PLACED FOR THIS ENCOUNTER: No orders of the defined types were placed in this encounter.   MEDICATIONS PRESCRIBED THIS ENCOUNTER: Meds ordered this encounter  Medications  . HYDROcodone-acetaminophen (NORCO/VICODIN) 5-325 MG per tablet 1 tablet    THERAPY PLAN:  Plan is for 2 cycles of chemotherapy, followed by restaging tests and then consideration for surgical resection by Dr. Barry Dienes.  All questions were answered. The patient knows to call the clinic with any problems, questions or concerns. We can certainly see the patient much sooner if necessary.  Patient and plan discussed with Dr. Ancil Linsey and she is in agreement with the aforementioned.   This note is electronically signed by: Doy Mince 12/26/2015 9:34 AM

## 2016-01-02 ENCOUNTER — Encounter (HOSPITAL_BASED_OUTPATIENT_CLINIC_OR_DEPARTMENT_OTHER): Payer: Medicare HMO

## 2016-01-02 ENCOUNTER — Ambulatory Visit (HOSPITAL_COMMUNITY): Payer: Medicare HMO | Admitting: Oncology

## 2016-01-02 VITALS — BP 116/62 | HR 74 | Temp 97.7°F | Resp 20 | Wt 126.4 lb

## 2016-01-02 DIAGNOSIS — Z5111 Encounter for antineoplastic chemotherapy: Secondary | ICD-10-CM | POA: Diagnosis not present

## 2016-01-02 DIAGNOSIS — C251 Malignant neoplasm of body of pancreas: Secondary | ICD-10-CM

## 2016-01-02 DIAGNOSIS — E876 Hypokalemia: Secondary | ICD-10-CM

## 2016-01-02 LAB — CBC WITH DIFFERENTIAL/PLATELET
Basophils Absolute: 0 10*3/uL (ref 0.0–0.1)
Basophils Relative: 0 %
Eosinophils Absolute: 0.1 10*3/uL (ref 0.0–0.7)
Eosinophils Relative: 2 %
HEMATOCRIT: 32.7 % — AB (ref 36.0–46.0)
HEMOGLOBIN: 10.1 g/dL — AB (ref 12.0–15.0)
LYMPHS ABS: 2.3 10*3/uL (ref 0.7–4.0)
LYMPHS PCT: 27 %
MCH: 25.1 pg — AB (ref 26.0–34.0)
MCHC: 30.9 g/dL (ref 30.0–36.0)
MCV: 81.3 fL (ref 78.0–100.0)
MONO ABS: 0.9 10*3/uL (ref 0.1–1.0)
MONOS PCT: 11 %
NEUTROS ABS: 5 10*3/uL (ref 1.7–7.7)
NEUTROS PCT: 60 %
Platelets: 255 10*3/uL (ref 150–400)
RBC: 4.02 MIL/uL (ref 3.87–5.11)
RDW: 19.7 % — AB (ref 11.5–15.5)
WBC: 8.3 10*3/uL (ref 4.0–10.5)

## 2016-01-02 LAB — COMPREHENSIVE METABOLIC PANEL
ALBUMIN: 3.5 g/dL (ref 3.5–5.0)
ALT: 18 U/L (ref 14–54)
ANION GAP: 10 (ref 5–15)
AST: 17 U/L (ref 15–41)
Alkaline Phosphatase: 75 U/L (ref 38–126)
BILIRUBIN TOTAL: 0.4 mg/dL (ref 0.3–1.2)
BUN: 10 mg/dL (ref 6–20)
CALCIUM: 8.5 mg/dL — AB (ref 8.9–10.3)
CO2: 25 mmol/L (ref 22–32)
Chloride: 103 mmol/L (ref 101–111)
Creatinine, Ser: 0.76 mg/dL (ref 0.44–1.00)
GFR calc Af Amer: >60 mL/min (ref >60)
GLUCOSE: 112 mg/dL — AB (ref 65–99)
POTASSIUM: 2.7 mmol/L — AB (ref 3.5–5.1)
Sodium: 138 mmol/L (ref 135–145)
TOTAL PROTEIN: 6.5 g/dL (ref 6.5–8.1)

## 2016-01-02 MED ORDER — HEPARIN SOD (PORK) LOCK FLUSH 100 UNIT/ML IV SOLN
500.0000 [IU] | Freq: Once | INTRAVENOUS | Status: AC | PRN
  Administered 2016-01-02: 500 [IU]
  Filled 2016-01-02: qty 5

## 2016-01-02 MED ORDER — PACLITAXEL PROTEIN-BOUND CHEMO INJECTION 100 MG
125.0000 mg/m2 | Freq: Once | INTRAVENOUS | Status: AC
  Administered 2016-01-02: 200 mg via INTRAVENOUS
  Filled 2016-01-02: qty 40

## 2016-01-02 MED ORDER — POTASSIUM CHLORIDE 10 MEQ/100ML IV SOLN
10.0000 meq | INTRAVENOUS | Status: AC
Start: 2016-01-02 — End: 2016-01-02
  Administered 2016-01-02 (×3): 10 meq via INTRAVENOUS
  Filled 2016-01-02 (×3): qty 100

## 2016-01-02 MED ORDER — PROCHLORPERAZINE MALEATE 10 MG PO TABS
ORAL_TABLET | ORAL | Status: AC
  Filled 2016-01-02: qty 1

## 2016-01-02 MED ORDER — SODIUM CHLORIDE 0.9 % IV SOLN
1000.0000 mg/m2 | Freq: Once | INTRAVENOUS | Status: AC
  Administered 2016-01-02: 1634 mg via INTRAVENOUS
  Filled 2016-01-02: qty 21.96

## 2016-01-02 MED ORDER — POTASSIUM CHLORIDE CRYS ER 20 MEQ PO TBCR: 40.0000 meq | EXTENDED_RELEASE_TABLET | Freq: Two times a day (BID) | ORAL | 2 refills | Status: DC

## 2016-01-02 MED ORDER — SODIUM CHLORIDE 0.9% FLUSH
10.0000 mL | INTRAVENOUS | Status: DC | PRN
  Administered 2016-01-02: 10 mL
  Filled 2016-01-02: qty 10

## 2016-01-02 MED ORDER — HYDROCODONE-ACETAMINOPHEN 5-325 MG PO TABS
1.0000 | ORAL_TABLET | ORAL | Status: DC | PRN
  Administered 2016-01-02 (×2): 1 via ORAL
  Filled 2016-01-02 (×2): qty 1

## 2016-01-02 MED ORDER — POTASSIUM CHLORIDE CRYS ER 20 MEQ PO TBCR
40.0000 meq | EXTENDED_RELEASE_TABLET | Freq: Once | ORAL | Status: AC
  Administered 2016-01-02: 40 meq via ORAL
  Filled 2016-01-02: qty 2

## 2016-01-02 MED ORDER — SODIUM CHLORIDE 0.9 % IV SOLN
Freq: Once | INTRAVENOUS | Status: AC
  Administered 2016-01-02: 12:00:00 via INTRAVENOUS

## 2016-01-02 MED ORDER — PROCHLORPERAZINE MALEATE 10 MG PO TABS
10.0000 mg | ORAL_TABLET | Freq: Once | ORAL | Status: AC
  Administered 2016-01-02: 10 mg via ORAL

## 2016-01-02 NOTE — Progress Notes (Signed)
CRITICAL VALUE ALERT Critical value received:  K+ 2.7  Date of notification:  01/02/16 Time of notification: [id number] Critical value read back:  Yes.   Nurse who received alert:  M.Ziaire Bieser, LPN MD notified (1st page):  S.Whitney Muse, MD

## 2016-01-02 NOTE — Progress Notes (Signed)
[place] tolerated chemo tx along with Potassium IV well without complaints or incident. Labs reviewed with Dr. Whitney Muse including critical Potassium level of 2.7 prior to administering her chemotherapy Pt given 3 separate Potassium runs, 10 meq each, IV and Potassium PO prior to discharge.Also instructed pt on script called in for her to take Potassium 1 PO BID starting at home tomorrow. Pt verbalized understanding. VSS upon discharge. Pt discharged via wheelchair in satisfactory condition with family transporting pt home

## 2016-01-02 NOTE — Patient Instructions (Signed)
[facility] Discharge Instructions for Patients Receiving Chemotherapy   Beginning January 23rd 2017 lab work for the [facility] will be done in the  Main lab at [facility] on 1st floor. If you have a lab appointment with the [facility] please come in thru the  Main Entrance and check in at the main information desk   Today you received the following chemotherapy agents Gemzar and Abraxane as well as Potassium IV. Follow-up as scheduled. Call clinic for any questions or concerns  To help prevent nausea and vomiting after your treatment, we encourage you to take your nausea medication   If you develop nausea and vomiting, or diarrhea that is not controlled by your medication, call the clinic.  The clinic phone number is [phone]. Office hours are Monday-Friday 8:30am-5:00pm.  BELOW ARE SYMPTOMS THAT SHOULD BE REPORTED IMMEDIATELY:  *FEVER GREATER THAN 101.0 F  *CHILLS WITH OR WITHOUT FEVER  NAUSEA AND VOMITING THAT IS NOT CONTROLLED WITH YOUR NAUSEA MEDICATION  *UNUSUAL SHORTNESS OF BREATH  *UNUSUAL BRUISING OR BLEEDING  TENDERNESS IN MOUTH AND THROAT WITH OR WITHOUT PRESENCE OF ULCERS  *URINARY PROBLEMS  *BOWEL PROBLEMS  UNUSUAL RASH Items with * indicate a potential emergency and should be followed up as soon as possible. If you have an emergency after office hours please contact your primary care physician or go to the nearest emergency department.  Please call the clinic during office hours if you have any questions or concerns.   You may also contact the Patient Navigator at [phone] should you have any questions or need assistance in obtaining follow up care.      Resources For Cancer Patients and their Caregivers ? American Cancer Society: Can assist with transportation, wigs, general needs, runs Look Good Feel Better.        878-695-0961 ? Cancer Care: Provides financial assistance, online support groups, medication/co-pay  assistance.  1-800-813-HOPE (419)044-6105) ? [place] Assists [place] cancer patients and their families through emotional , educational and financial support.  (202) 473-1773 ? [place] DSS Where to apply for food stamps, Medicaid and utility assistance. (636)379-6315 ? [facility]: Transportation to medical appointments. (737)758-8423 ? Social Security Administration: May apply for disability if have a Stage IV cancer. (860) 222-3121 212-770-3087 ? [employer], Disability and Transit Services: Assists with nutrition, care and transit needs. 5077480644

## 2016-01-09 ENCOUNTER — Encounter (HOSPITAL_COMMUNITY): Payer: Medicare HMO | Attending: Hematology & Oncology

## 2016-01-09 ENCOUNTER — Encounter (HOSPITAL_COMMUNITY): Payer: Self-pay

## 2016-01-09 VITALS — BP 114/66 | HR 106 | Temp 97.9°F | Resp 18 | Wt 123.1 lb

## 2016-01-09 DIAGNOSIS — C251 Malignant neoplasm of body of pancreas: Secondary | ICD-10-CM | POA: Diagnosis not present

## 2016-01-09 DIAGNOSIS — Z5111 Encounter for antineoplastic chemotherapy: Secondary | ICD-10-CM

## 2016-01-09 LAB — COMPREHENSIVE METABOLIC PANEL
ALT: 17 U/L (ref 14–54)
ANION GAP: 10 (ref 5–15)
AST: 18 U/L (ref 15–41)
Albumin: 3.3 g/dL — ABNORMAL LOW (ref 3.5–5.0)
Alkaline Phosphatase: 81 U/L (ref 38–126)
BUN: 14 mg/dL (ref 6–20)
CHLORIDE: 105 mmol/L (ref 101–111)
CO2: 21 mmol/L — ABNORMAL LOW (ref 22–32)
CREATININE: 0.92 mg/dL (ref 0.44–1.00)
Calcium: 8.7 mg/dL — ABNORMAL LOW (ref 8.9–10.3)
Glucose, Bld: 128 mg/dL — ABNORMAL HIGH (ref 65–99)
POTASSIUM: 3.9 mmol/L (ref 3.5–5.1)
Sodium: 136 mmol/L (ref 135–145)
Total Bilirubin: 0.4 mg/dL (ref 0.3–1.2)
Total Protein: 7 g/dL (ref 6.5–8.1)

## 2016-01-09 LAB — CBC WITH DIFFERENTIAL/PLATELET
Basophils Absolute: 0 10*3/uL (ref 0.0–0.1)
Basophils Relative: 0 %
EOS PCT: 0 %
Eosinophils Absolute: 0 10*3/uL (ref 0.0–0.7)
HCT: 29.5 % — ABNORMAL LOW (ref 36.0–46.0)
Hemoglobin: 9.3 g/dL — ABNORMAL LOW (ref 12.0–15.0)
LYMPHS ABS: 2.8 10*3/uL (ref 0.7–4.0)
LYMPHS PCT: 32 %
MCH: 25.4 pg — AB (ref 26.0–34.0)
MCHC: 31.5 g/dL (ref 30.0–36.0)
MCV: 80.6 fL (ref 78.0–100.0)
MONO ABS: 0.5 10*3/uL (ref 0.1–1.0)
MONOS PCT: 6 %
Neutro Abs: 5.5 10*3/uL (ref 1.7–7.7)
Neutrophils Relative %: 62 %
PLATELETS: 286 10*3/uL (ref 150–400)
RBC: 3.66 MIL/uL — ABNORMAL LOW (ref 3.87–5.11)
RDW: 19.4 % — AB (ref 11.5–15.5)
WBC: 8.9 10*3/uL (ref 4.0–10.5)

## 2016-01-09 MED ORDER — SODIUM CHLORIDE 0.9 % IV SOLN
975.0000 mg/m2 | Freq: Once | INTRAVENOUS | Status: AC
  Administered 2016-01-09: 1596 mg via INTRAVENOUS
  Filled 2016-01-09: qty 26.22

## 2016-01-09 MED ORDER — SODIUM CHLORIDE 0.9 % IV SOLN
Freq: Once | INTRAVENOUS | Status: AC
  Administered 2016-01-09: 11:00:00 via INTRAVENOUS

## 2016-01-09 MED ORDER — HEPARIN SOD (PORK) LOCK FLUSH 100 UNIT/ML IV SOLN
500.0000 [IU] | Freq: Once | INTRAVENOUS | Status: AC | PRN
  Administered 2016-01-09: 500 [IU]

## 2016-01-09 MED ORDER — PACLITAXEL PROTEIN-BOUND CHEMO INJECTION 100 MG
125.0000 mg/m2 | Freq: Once | INTRAVENOUS | Status: AC
  Administered 2016-01-09: 200 mg via INTRAVENOUS
  Filled 2016-01-09: qty 40

## 2016-01-09 MED ORDER — HYDROCODONE-ACETAMINOPHEN 5-325 MG PO TABS
1.0000 | ORAL_TABLET | ORAL | Status: DC | PRN
  Administered 2016-01-09: 1 via ORAL

## 2016-01-09 MED ORDER — HYDROCODONE-ACETAMINOPHEN 5-325 MG PO TABS
ORAL_TABLET | ORAL | Status: AC
  Filled 2016-01-09: qty 1

## 2016-01-09 MED ORDER — PROCHLORPERAZINE MALEATE 10 MG PO TABS
ORAL_TABLET | ORAL | Status: AC
  Filled 2016-01-09: qty 1

## 2016-01-09 MED ORDER — PROCHLORPERAZINE MALEATE 10 MG PO TABS
10.0000 mg | ORAL_TABLET | Freq: Once | ORAL | Status: AC
  Administered 2016-01-09: 10 mg via ORAL

## 2016-01-09 MED ORDER — SODIUM CHLORIDE 0.9% FLUSH
10.0000 mL | INTRAVENOUS | Status: DC | PRN
  Administered 2016-01-09: 10 mL
  Filled 2016-01-09: qty 10

## 2016-01-09 MED ORDER — SODIUM CHLORIDE 0.9 % IV SOLN: 975.0000 mg/m2 | Freq: Once | INTRAVENOUS | Status: DC

## 2016-01-09 NOTE — Patient Instructions (Signed)
[facility] Discharge Instructions for Patients Receiving Chemotherapy   Beginning January 23rd 2017 lab work for the [facility] will be done in the  Main lab at [facility] on 1st floor. If you have a lab appointment with the [facility] please come in thru the  Main Entrance and check in at the main information desk   Today you received the following chemotherapy agents Abraxane and gemzar  To help prevent nausea and vomiting after your treatment, we encourage you to take your nausea medication     If you develop nausea and vomiting, or diarrhea that is not controlled by your medication, call the clinic.  The clinic phone number is [phone]. Office hours are Monday-Friday 8:30am-5:00pm.  BELOW ARE SYMPTOMS THAT SHOULD BE REPORTED IMMEDIATELY:  *FEVER GREATER THAN 101.0 F  *CHILLS WITH OR WITHOUT FEVER  NAUSEA AND VOMITING THAT IS NOT CONTROLLED WITH YOUR NAUSEA MEDICATION  *UNUSUAL SHORTNESS OF BREATH  *UNUSUAL BRUISING OR BLEEDING  TENDERNESS IN MOUTH AND THROAT WITH OR WITHOUT PRESENCE OF ULCERS  *URINARY PROBLEMS  *BOWEL PROBLEMS  UNUSUAL RASH Items with * indicate a potential emergency and should be followed up as soon as possible. If you have an emergency after office hours please contact your primary care physician or go to the nearest emergency department.  Please call the clinic during office hours if you have any questions or concerns.   You may also contact the Patient Navigator at [phone] should you have any questions or need assistance in obtaining follow up care.      Resources For Cancer Patients and their Caregivers ? American Cancer Society: Can assist with transportation, wigs, general needs, runs Look Good Feel Better.        (239)262-9824 ? Cancer Care: Provides financial assistance, online support groups, medication/co-pay assistance.  1-800-813-HOPE (234)566-3595) ? [place] Assists  [place] cancer patients and their families through emotional , educational and financial support.  (903)811-4485 ? [place] DSS Where to apply for food stamps, Medicaid and utility assistance. 8560108629 ? [employer]: Transportation to medical appointments. [phone] ? Social Security Administration: May apply for disability if have a Stage IV cancer. 505 089 7732 (667)354-4277 ? [employer], Disability and Transit Services: Assists with nutrition, care and transit needs. 539-030-7058

## 2016-01-09 NOTE — Progress Notes (Signed)
Chemotherapy given today per orders. Patient tolerated it well, no problems. Vitals stable and discharged home from clinic. Follow up as scheduled.

## 2016-01-10 ENCOUNTER — Telehealth: Payer: Self-pay | Admitting: Orthopaedic Surgery

## 2016-01-10 MED ORDER — HYDROCODONE-ACETAMINOPHEN 5-325 MG PO TABS: 1.0000 | ORAL_TABLET | Freq: Four times a day (QID) | ORAL | 0 refills | Status: DC | PRN

## 2016-01-10 NOTE — Telephone Encounter (Signed)
Patient requests refill:  HYDROcodone-acetaminophen (NORCO/VICODIN) 5-325 MG tablet 90 tablet

## 2016-01-15 ENCOUNTER — Encounter: Payer: Self-pay | Admitting: Adult Health

## 2016-01-15 ENCOUNTER — Encounter: Payer: Medicare HMO | Admitting: Adult Health

## 2016-01-15 NOTE — Progress Notes (Deleted)
Cardiology Office Note   Date:  01/15/2016   ID:  Lindsay Salazar, DOB May 17, 1950, MRN [id number]  PCP:  [name], PA-C  Cardiologist: Cloria Spring, NP   No chief complaint on file.     History of Present Illness: Lindsay Salazar is a 65 y.o. female who presents for ongoing assessment and management of paroxysmal 8 fibrillation, hypertension, with history of COPD and tobacco abuse. The patient was seen on consultation after being admitted for recurrent chest pain and epigastric pain. She underwent endoscopic ultrasound which was performed and found to have a 4.2 cm mass in the body of the pancreas causing main pancreatic duct obstruction (and dilatation) with pulmonary cytology being positive for malignancy. She is being followed by oncology and started on chemotherapy.  She was continued on diltiazem 240 mg daily, metoprolol, Xarelto,, ongoing treatment of her COPD, and oncology needs.  Left ventricle: The cavity size was normal. Wall thickness was   normal. Systolic function was normal. The estimated ejection   fraction was 55%. There is akinesis of the midanteroseptal   myocardium. The study is not technically sufficient to allow   evaluation of LV diastolic function. - Aortic valve: Mildly calcified annulus. Trileaflet. There was   trivial regurgitation. - Mitral valve: Mildly thickened leaflets . There was mild   regurgitation. - Left atrium: The atrium was mildly dilated. - Right ventricle: Systolic function was low normal. - Right atrium: The atrium was mildly dilated. Central venous   pressure (est): 8 mm Hg. - Tricuspid valve: There was moderate regurgitation. - Pulmonary arteries: PA peak pressure: 41 mm Hg (S). - Pericardium, extracardiac: There was no pericardial effusion.  Past Medical History:  Diagnosis Date  . Anginal pain ([place])    thought she was having   heartburn  . Arthritis    bursitis , fibromyalgia  . Arthritis    "right hip" (11/27/2015)   . Asthma   . Atrial fibrillation ([place])   . Chronic bronchitis ([place])   . Chronic right hip pain   . COPD (chronic obstructive pulmonary disease) ([place])    wears home o2 prn  . Daily headache    "last 2-3 months" (11/27/2015)  . Exposure to TB    "mom had it when she was pregnant with me"  . Fibromyalgia   . GERD (gastroesophageal reflux disease)    use to have it but not now  . Heart murmur    62-  47 years old  . Hypertension   . On home oxygen therapy    11/27/2015 "whenever I need it; don't know how many liters"  . Pancreatic cancer ([place])   . Pneumonia    "several times" (11/27/2015)  . Pneumothorax     Past Surgical History:  Procedure Laterality Date  . ANTERIOR CERVICAL DECOMP/DISCECTOMY FUSION     "put 4 screws in"  . BACK SURGERY    . CARDIAC CATHETERIZATION  08/2004   Archie Endo 06/18/2010  . CARDIOVERSION N/A 07/18/2015   Procedure: CARDIOVERSION;  Surgeon: Josue Hector, MD;  Location: [facility] ENDO SUITE;  Service: Cardiovascular;  Laterality: N/A;  . ESOPHAGOGASTRODUODENOSCOPY N/A 08/09/2015   Procedure: ESOPHAGOGASTRODUODENOSCOPY (EGD);  Surgeon: Rogene Houston, MD;  Location: [facility] ENDO SUITE;  Service: Endoscopy;  Laterality: N/A;  . EUS N/A 11/29/2015   Procedure: UPPER ENDOSCOPIC ULTRASOUND (EUS) RADIAL;  Surgeon: Milus Banister, MD;  Location: [facility] ENDOSCOPY;  Service: Endoscopy;  Laterality: N/A;  . IR GENERIC HISTORICAL  12/18/2015  IR US GUIDE VASC ACCESS RIGHT 12/18/2015 Sandi Mariscal, MD [facility]-INTERV RAD  . IR GENERIC HISTORICAL  12/18/2015   IR FLUORO GUIDE PORT INSERTION RIGHT 12/18/2015 Sandi Mariscal, MD [facility]-INTER[facility] RAD  . TONSILLECTOMY    . TUBAL LIGATION       Current Outpatient Prescriptions  Medication Sig Dispense Refill  . albuterol (PROVENTIL) (2.5 MG/3ML) 0.083% nebulizer solution Take 3 mLs (2.5 mg total) by nebulization every 6 (six) hours as needed for wheezing or shortness of breath. 75 mL 12  . azithromycin (ZITHROMAX) 250 MG tablet As directed 6 each 0   . benzonatate (TESSALON) 100 MG capsule Take 1 capsule (100 mg total) by mouth 3 (three) times daily. 30 capsule 0  . COMBIVENT RESPIMAT 20-100 MCG/ACT AERS respimat Inhale 1 puff into the lungs 2 (two) times daily. 1 Inhaler 5  . diltiazem (CARDIZEM CD) 240 MG 24 hr capsule Take 1 capsule (240 mg total) by mouth daily. 30 capsule 4  . escitalopram (LEXAPRO) 10 MG tablet Take 1 tablet (10 mg total) by mouth every evening. 30 tablet 0  . furosemide (LASIX) 20 MG tablet Take 1 tablet (20 mg total) by mouth daily as needed for edema. (Patient taking differently: Take 20 mg by mouth daily as needed for fluid or edema. ) 90 tablet 3  . Gemcitabine HCl (GEMZAR IV) Inject into the vein. Day 1, day 8, day 15, every 28 days    . HYDROcodone-acetaminophen (NORCO/VICODIN) 5-325 MG tablet Take 1 tablet by mouth every 6 (six) hours as needed for moderate pain (Must last 30 days.Do not take and drive a car or use machinery.). 85 tablet 0  . ipratropium-albuterol (DUONEB) 0.5-2.5 (3) MG/3ML SOLN Inhale 3 mLs into the lungs every 6 (six) hours as needed (for shortness of breath). 360 mL 3  . lidocaine-prilocaine (EMLA) cream Apply to affected area once 30 g 3  . metoprolol tartrate 37.5 MG TABS Take 37.5 mg by mouth 2 (two) times daily. 60 tablet 4  . nicotine (NICODERM CQ - DOSED IN MG/24 HOURS) 14 mg/24hr patch Place 1 patch (14 mg total) onto the skin daily. 28 patch 3  . ondansetron (ZOFRAN) 8 MG tablet Take 1 tablet (8 mg total) by mouth 2 (two) times daily as needed (Nausea or vomiting). 30 tablet 1  . PACLitaxel Protein-Bound Part (ABRAXANE IV) Inject into the vein. Day 1, day 8, day 15, every 28 days    . pantoprazole (PROTONIX) 40 MG tablet Take 1 tablet (40 mg total) by mouth daily. 60 tablet 3  . potassium chloride SA (K-DUR,KLOR-CON) 20 MEQ tablet Take 2 tablets (40 mEq total) by mouth 2 (two) times daily. 120 tablet 2  . prochlorperazine (COMPAZINE) 10 MG tablet Take 1 tablet (10 mg total) by mouth  every 6 (six) hours as needed (Nausea or vomiting). 30 tablet 1  . rivaroxaban (XARELTO) 20 MG TABS tablet Take 1 tablet (20 mg total) by mouth daily with supper. 30 tablet 10  . SYMBICORT 160-4.5 MCG/ACT inhaler Inhale 1 puff into the lungs 2 (two) times daily. 1 Inhaler 12   No current facility-administered medications for this visit.     Allergies:   Ibuprofen; Penicillins; Tiotropium bromide monohydrate; and Tramadol    Social History:  The patient  reports that she quit smoking about 6 weeks ago. Her smoking use included Cigarettes. She started smoking about 46 years ago. She has a 23.00 pack-year smoking history. She has never used smokeless tobacco. She reports that she does  not drink alcohol or use drugs.   Family History:  The patient's family history includes COPD in her mother; Diabetes in her father; Stroke in her father.    ROS: All other systems are reviewed and negative. Unless otherwise mentioned in H&P    PHYSICAL EXAM: VS:  There were no vitals taken for this visit. , BMI There is no height or weight on file to calculate BMI. GEN: Well nourished, well developed, in no acute distress HEENT: normal Neck: no JVD, carotid bruits, or masses Cardiac: ***RRR; no murmurs, rubs, or gallops,no edema  Respiratory:  clear to auscultation bilaterally, normal work of breathing GI: soft, nontender, nondistended, + BS MS: no deformity or atrophy Skin: warm and dry, no rash Neuro:  Strength and sensation are intact Psych: euthymic mood, full affect   EKG:  EKG {ACTION; IS/IS [id number] ordered today. The ekg ordered today demonstrates ***   Recent Labs: 06/27/2015: TSH 2.678 07/17/2015: B Natriuretic Peptide 415.0 08/09/2015: Magnesium 1.7 01/09/2016: ALT 17; BUN 14; Creatinine, Ser 0.92; Hemoglobin 9.3; Platelets 286; Potassium 3.9; Sodium 136    Lipid Panel No results found for: CHOL, TRIG, HDL, CHOLHDL, VLDL, LDLCALC, LDLDIRECT    Wt Readings from Last 3 Encounters:   01/09/16 123 lb 1.6 oz (55.8 kg)  01/02/16 126 lb 6.4 oz (57.3 kg)  12/26/15 127 lb 4.8 oz (57.7 kg)      Other studies Reviewed: Additional studies/ records that were reviewed today include: ***. Review of the above records demonstrates: ***   ASSESSMENT AND PLAN:  1.  ***   Current medicines are reviewed at length with the patient today.    Labs/ tests ordered today include: *** No orders of the defined types were placed in this encounter.    Disposition:   FU with *** in {gen number [id number] {TIME; UNITS DAY/WEEK/MONTH:19136}   Signed, Jory Sims, NP  01/15/2016 7:32 AM    [facility] [address], [place], [place] [address] Phone: [phone]; Fax: [phone]

## 2016-01-16 ENCOUNTER — Ambulatory Visit (HOSPITAL_COMMUNITY): Payer: Medicare HMO | Admitting: Hematology & Oncology

## 2016-01-16 ENCOUNTER — Ambulatory Visit (HOSPITAL_COMMUNITY): Payer: Medicare HMO

## 2016-01-20 ENCOUNTER — Emergency Department (HOSPITAL_COMMUNITY): Payer: Medicare HMO

## 2016-01-20 ENCOUNTER — Inpatient Hospital Stay (HOSPITAL_COMMUNITY)
Admission: EM | Admit: 2016-01-20 | Discharge: 2016-01-23 | DRG: 192 | Disposition: A | Discharge status: Home / Self Care | Payer: Medicare HMO | Attending: Internal Medicine | Admitting: Internal Medicine

## 2016-01-20 ENCOUNTER — Other Ambulatory Visit: Payer: Self-pay

## 2016-01-20 ENCOUNTER — Encounter (HOSPITAL_COMMUNITY): Payer: Self-pay | Admitting: *Deleted

## 2016-01-20 DIAGNOSIS — I482 Chronic atrial fibrillation: Secondary | ICD-10-CM | POA: Diagnosis present

## 2016-01-20 DIAGNOSIS — I481 Persistent atrial fibrillation: Secondary | ICD-10-CM

## 2016-01-20 DIAGNOSIS — Z9221 Personal history of antineoplastic chemotherapy: Secondary | ICD-10-CM

## 2016-01-20 DIAGNOSIS — Z880 Allergy status to penicillin: Secondary | ICD-10-CM

## 2016-01-20 DIAGNOSIS — I10 Essential (primary) hypertension: Secondary | ICD-10-CM | POA: Diagnosis present

## 2016-01-20 DIAGNOSIS — R197 Diarrhea, unspecified: Secondary | ICD-10-CM | POA: Diagnosis present

## 2016-01-20 DIAGNOSIS — J441 Chronic obstructive pulmonary disease with (acute) exacerbation: Secondary | ICD-10-CM | POA: Diagnosis not present

## 2016-01-20 DIAGNOSIS — F17210 Nicotine dependence, cigarettes, uncomplicated: Secondary | ICD-10-CM | POA: Diagnosis present

## 2016-01-20 DIAGNOSIS — Z7951 Long term (current) use of inhaled steroids: Secondary | ICD-10-CM

## 2016-01-20 DIAGNOSIS — Z9981 Dependence on supplemental oxygen: Secondary | ICD-10-CM

## 2016-01-20 DIAGNOSIS — Z79899 Other long term (current) drug therapy: Secondary | ICD-10-CM

## 2016-01-20 DIAGNOSIS — M797 Fibromyalgia: Secondary | ICD-10-CM | POA: Diagnosis present

## 2016-01-20 DIAGNOSIS — Z981 Arthrodesis status: Secondary | ICD-10-CM

## 2016-01-20 DIAGNOSIS — K219 Gastro-esophageal reflux disease without esophagitis: Secondary | ICD-10-CM | POA: Diagnosis present

## 2016-01-20 DIAGNOSIS — Z201 Contact with and (suspected) exposure to tuberculosis: Secondary | ICD-10-CM | POA: Diagnosis present

## 2016-01-20 DIAGNOSIS — Z7901 Long term (current) use of anticoagulants: Secondary | ICD-10-CM | POA: Diagnosis not present

## 2016-01-20 DIAGNOSIS — Z825 Family history of asthma and other chronic lower respiratory diseases: Secondary | ICD-10-CM

## 2016-01-20 DIAGNOSIS — Z888 Allergy status to other drugs, medicaments and biological substances status: Secondary | ICD-10-CM

## 2016-01-20 DIAGNOSIS — I4891 Unspecified atrial fibrillation: Secondary | ICD-10-CM | POA: Diagnosis present

## 2016-01-20 DIAGNOSIS — Z8507 Personal history of malignant neoplasm of pancreas: Secondary | ICD-10-CM

## 2016-01-20 LAB — CBC WITH DIFFERENTIAL/PLATELET
BASOS ABS: 0.1 10*3/uL (ref 0.0–0.1)
BASOS PCT: 1 %
EOS ABS: 0.1 10*3/uL (ref 0.0–0.7)
Eosinophils Relative: 1 %
HCT: 31.7 % — ABNORMAL LOW (ref 36.0–46.0)
Hemoglobin: 9.8 g/dL — ABNORMAL LOW (ref 12.0–15.0)
LYMPHS ABS: 4.3 10*3/uL — AB (ref 0.7–4.0)
LYMPHS PCT: 39 %
MCH: 26.1 pg (ref 26.0–34.0)
MCHC: 30.9 g/dL (ref 30.0–36.0)
MCV: 84.5 fL (ref 78.0–100.0)
MONO ABS: 1.9 10*3/uL — AB (ref 0.1–1.0)
Monocytes Relative: 17 %
NEUTROS ABS: 4.6 10*3/uL (ref 1.7–7.7)
Neutrophils Relative %: 42 %
PLATELETS: 356 10*3/uL (ref 150–400)
RBC: 3.75 MIL/uL — ABNORMAL LOW (ref 3.87–5.11)
RDW: 21.1 % — AB (ref 11.5–15.5)
WBC: 11 10*3/uL — ABNORMAL HIGH (ref 4.0–10.5)

## 2016-01-20 LAB — BASIC METABOLIC PANEL
Anion gap: 9 (ref 5–15)
BUN: 23 mg/dL — AB (ref 6–20)
CALCIUM: 8.9 mg/dL (ref 8.9–10.3)
CO2: 20 mmol/L — ABNORMAL LOW (ref 22–32)
Chloride: 108 mmol/L (ref 101–111)
Creatinine, Ser: 1.27 mg/dL — ABNORMAL HIGH (ref 0.44–1.00)
GFR calc Af Amer: 49 mL/min — ABNORMAL LOW (ref >60)
GFR, EST NON AFRICAN AMERICAN: 43 mL/min — AB (ref >60)
Glucose, Bld: 97 mg/dL (ref 65–99)
POTASSIUM: 4.9 mmol/L (ref 3.5–5.1)
SODIUM: 137 mmol/L (ref 135–145)

## 2016-01-20 LAB — TROPONIN I

## 2016-01-20 LAB — LACTIC ACID, PLASMA: LACTIC ACID, VENOUS: 1.9 mmol/L (ref 0.5–1.9)

## 2016-01-20 MED ORDER — IPRATROPIUM-ALBUTEROL 0.5-2.5 (3) MG/3ML IN SOLN
3.0000 mL | Freq: Once | RESPIRATORY_TRACT | Status: AC
  Administered 2016-01-20: 3 mL via RESPIRATORY_TRACT
  Filled 2016-01-20: qty 3

## 2016-01-20 MED ORDER — ALBUTEROL SULFATE (2.5 MG/3ML) 0.083% IN NEBU
2.5000 mg | INHALATION_SOLUTION | Freq: Once | RESPIRATORY_TRACT | Status: AC
  Administered 2016-01-20: 2.5 mg via RESPIRATORY_TRACT
  Filled 2016-01-20: qty 3

## 2016-01-20 MED ORDER — HYDROCODONE-ACETAMINOPHEN 5-325 MG PO TABS
1.0000 | ORAL_TABLET | Freq: Once | ORAL | Status: AC
  Administered 2016-01-20: 1 via ORAL
  Filled 2016-01-20: qty 1

## 2016-01-20 MED ORDER — METHYLPREDNISOLONE SODIUM SUCC 125 MG IJ SOLR
125.0000 mg | Freq: Once | INTRAMUSCULAR | Status: AC
  Administered 2016-01-20: 125 mg via INTRAVENOUS
  Filled 2016-01-20: qty 2

## 2016-01-20 MED ORDER — ALPRAZOLAM 0.5 MG PO TABS
0.5000 mg | ORAL_TABLET | Freq: Once | ORAL | Status: AC
  Administered 2016-01-20: 0.5 mg via ORAL
  Filled 2016-01-20: qty 1

## 2016-01-20 NOTE — H&P (Signed)
TRH H&P    Patient Demographics:    Lindsay Salazar, is a 65 y.o. female  MRN: [id number]  DOB - 20-Jun-1950  Admit Date - 01/20/2016  Referring MD/NP/PA: Dr Thurnell Garbe  Outpatient Primary MD for the patient is [name], PA-C  Patient coming from: Home  Chief Complaint  Patient presents with  . Respiratory Distress      HPI:    Lindsay Salazar  is a 66 y.o. female, With history of new diagnosis pancreatic cancer, started on chemotherapyGemcitabine/Abraxane , last chemotherapy was on 01/09/2016. Patient says that today she developed panic attack, and had heart and breathing. She has chronic home O2 for COPD. She also has been having diarrhea 4-5 loose bowel movements for past few days. She denies any fever or chills. Denies chest pain. As the patient she ran out of her inhalers at home. She denies nausea vomiting. No abdominal pain.  In the ED, patient was given DuoNeb nebulizer, Solu-Medrol. Patient admits to be anxious.    Review of systems:    In addition to the HPI above,  No Fever-chills, No Headache, No changes with Vision or hearing, No problems swallowing food or Liquids, No Abdominal pain, No Nausea or Vomiting, bowel movements are regular, No Blood in stool or Urine, No dysuria, No new skin rashes or bruises, No new joints pains-aches,  No new weakness, tingling, numbness in any extremity, No recent weight gain or loss, No polyuria, polydypsia or polyphagia, No significant Mental Stressors.  A full 10 point Review of Systems was done, except as stated above, all other Review of Systems were negative.   With Past History of the following :    Past Medical History:  Diagnosis Date  . Anginal pain ([place])    thought she was having   heartburn  . Arthritis    bursitis , fibromyalgia  . Arthritis    "right hip" (11/27/2015)  . Asthma   . Atrial fibrillation ([place])   . Chronic bronchitis  ([place])   . Chronic right hip pain   . COPD (chronic obstructive pulmonary disease) ([place])    wears home o2 prn  . Daily headache    "last 2-3 months" (11/27/2015)  . Exposure to TB    "mom had it when she was pregnant with me"  . Fibromyalgia   . GERD (gastroesophageal reflux disease)    use to have it but not now  . Heart murmur    33-  56 years old  . Hypertension   . On home oxygen therapy    11/27/2015 "whenever I need it; don't know how many liters"  . Pancreatic cancer ([place])   . Pneumonia    "several times" (11/27/2015)  . Pneumothorax       Past Surgical History:  Procedure Laterality Date  . ANTERIOR CERVICAL DECOMP/DISCECTOMY FUSION     "put 4 screws in"  . BACK SURGERY    . CARDIAC CATHETERIZATION  08/2004   Archie Endo 06/18/2010  . CARDIOVERSION N/A 07/18/2015   Procedure: CARDIOVERSION;  Surgeon: Wallis Bamberg  Johnsie Cancel, MD;  Location: [facility];  Service: Cardiovascular;  Laterality: N/A;  . ESOPHAGOGASTRODUODENOSCOPY N/A 08/09/2015   Procedure: ESOPHAGOGASTRODUODENOSCOPY (EGD);  Surgeon: Rogene Houston, MD;  Location: [facility];  Service: Endoscopy;  Laterality: N/A;  . EUS N/A 11/29/2015   Procedure: UPPER ENDOSCOPIC ULTRASOUND (EUS) RADIAL;  Surgeon: Milus Banister, MD;  Location: [facility];  Service: Endoscopy;  Laterality: N/A;  . IR GENERIC HISTORICAL  12/18/2015   IR US GUIDE VASC ACCESS RIGHT 12/18/2015 Sandi Mariscal, MD [facility]  . IR GENERIC HISTORICAL  12/18/2015   IR FLUORO GUIDE PORT INSERTION RIGHT 12/18/2015 Sandi Mariscal, MD [facility]  . TONSILLECTOMY    . TUBAL LIGATION        Social History:      Social History  Substance Use Topics  . Smoking status: Former Smoker    Packs/day: 0.50    Years: 46.00    Types: Cigarettes    Start date: 10/09/1969    Quit date: 11/29/2015  . Smokeless tobacco: Never Used  . Alcohol use No     Comment: quit 30 years ago       Family History :     Family History  Problem Relation Age of Onset  .  COPD Mother   . Diabetes Father   . Stroke Father       Home Medications:   Prior to Admission medications   Medication Sig Start Date End Date Taking? Authorizing Provider  albuterol (PROVENTIL) (2.5 MG/3ML) 0.083% nebulizer solution Take 3 mLs (2.5 mg total) by nebulization every 6 (six) hours as needed for wheezing or shortness of breath. 12/03/15  Yes Ripudeep K Rai, MD  COMBIVENT RESPIMAT 20-100 MCG/ACT AERS respimat Inhale 1 puff into the lungs 2 (two) times daily. 12/03/15  Yes Ripudeep Krystal Eaton, MD  diltiazem (CARDIZEM CD) 240 MG 24 hr capsule Take 1 capsule (240 mg total) by mouth daily. 12/03/15  Yes Ripudeep Krystal Eaton, MD  escitalopram (LEXAPRO) 10 MG tablet Take 1 tablet (10 mg total) by mouth every evening. 12/03/15  Yes Ripudeep Krystal Eaton, MD  furosemide (LASIX) 20 MG tablet Take 1 tablet (20 mg total) by mouth daily as needed for edema. Patient taking differently: Take 20 mg by mouth daily as needed for fluid or edema.  07/06/15  Yes Arnoldo Lenis, MD  Gemcitabine HCl (GEMZAR IV) Inject into the vein. Day 1, day 8, day 15, every 28 days   Yes Historical Provider, MD  HYDROcodone-acetaminophen (NORCO/VICODIN) 5-325 MG tablet Take 1 tablet by mouth every 6 (six) hours as needed for moderate pain (Must last 30 days.Do not take and drive a car or use machinery.). 01/10/16  Yes Sanjuana Kava, MD  ipratropium-albuterol (DUONEB) 0.5-2.5 (3) MG/3ML SOLN Inhale 3 mLs into the lungs every 6 (six) hours as needed (for shortness of breath). 12/03/15  Yes Ripudeep Krystal Eaton, MD  lidocaine-prilocaine (EMLA) cream Apply to affected area once 12/10/15  Yes Patrici Ranks, MD  metoprolol tartrate 37.5 MG TABS Take 37.5 mg by mouth 2 (two) times daily. 12/03/15  Yes Ripudeep K Rai, MD  nicotine (NICODERM CQ - DOSED IN MG/24 HOURS) 14 mg/24hr patch Place 1 patch (14 mg total) onto the skin daily. 12/03/15  Yes Ripudeep Krystal Eaton, MD  ondansetron (ZOFRAN) 8 MG tablet Take 1 tablet (8 mg total) by mouth 2 (two)  times daily as needed (Nausea or vomiting). 12/10/15  Yes Patrici Ranks, MD  PACLitaxel Protein-Bound Part (ABRAXANE IV)  Inject into the vein. Day 1, day 8, day 15, every 28 days   Yes Historical Provider, MD  pantoprazole (PROTONIX) 40 MG tablet Take 1 tablet (40 mg total) by mouth daily. 12/03/15  Yes Ripudeep Krystal Eaton, MD  potassium chloride SA (K-DUR,KLOR-CON) 20 MEQ tablet Take 2 tablets (40 mEq total) by mouth 2 (two) times daily. 01/02/16 04/01/16 Yes Manon Hilding Kefalas, PA-C  prochlorperazine (COMPAZINE) 10 MG tablet Take 1 tablet (10 mg total) by mouth every 6 (six) hours as needed (Nausea or vomiting). 12/10/15  Yes Patrici Ranks, MD  rivaroxaban (XARELTO) 20 MG TABS tablet Take 1 tablet (20 mg total) by mouth daily with supper. 07/19/15  Yes Reyne Dumas, MD  SYMBICORT 160-4.5 MCG/ACT inhaler Inhale 1 puff into the lungs 2 (two) times daily. 12/03/15  Yes Ripudeep Krystal Eaton, MD     Allergies:     Allergies  Allergen Reactions  . Ibuprofen Nausea And Vomiting  . Penicillins Swelling    Has patient had a PCN reaction causing immediate rash, facial/tongue/throat swelling, SOB or lightheadedness with hypotension: Yes Has patient had a PCN reaction causing severe rash involving mucus membranes or skin necrosis: No Has patient had a PCN reaction that required hospitalization No Has patient had a PCN reaction occurring within the last 10 years: No If all of the above answers are "NO", then may proceed with Cephalosporin use.   . Tiotropium Bromide Monohydrate Itching  . Tramadol Nausea And Vomiting     Physical Exam:   Vitals  Blood pressure 119/71, pulse 94, temperature 98.6 F (37 C), temperature source Oral, resp. rate 26, height 5\' 3"  (1.6 m), weight 54.9 kg (121 lb), SpO2 99 %.  1.  General: Appears mildly anxious  2. Psychiatric:  Intact judgement and  insight, awake alert, oriented x 3.  3. Neurologic: No focal neurological deficits, all cranial nerves intact.Strength  5/5 all 4 extremities, sensation intact all 4 extremities, plantars down going.  4. Eyes :  anicteric sclerae, moist conjunctivae with no lid lag. PERRLA.  5. ENMT:  Oropharynx clear with moist mucous membranes and good dentition  6. Neck:  supple, no cervical lymphadenopathy appriciated, No thyromegaly  7. Respiratory : Normal respiratory effort, bilateral rhonchi on auscultation.  8. Cardiovascular : RRR, no gallops, rubs or murmurs, no leg edema  9. Gastrointestinal:  Positive bowel sounds, abdomen soft, non-tender to palpation,no hepatosplenomegaly, no rigidity or guarding       10. Skin:  No cyanosis, normal texture and turgor, no rash, lesions or ulcers  11.Musculoskeletal:  Good muscle tone,  joints appear normal , no effusions,  normal range of motion    Data Review:    CBC  Recent Labs Lab 01/20/16 2127  WBC 11.0*  HGB 9.8*  HCT 31.7*  PLT 356  MCV 84.5  MCH 26.1  MCHC 30.9  RDW 21.1*  LYMPHSABS 4.3*  MONOABS 1.9*  EOSABS 0.1  BASOSABS 0.1   ------------------------------------------------------------------------------------------------------------------  Chemistries   Recent Labs Lab 01/20/16 2127  NA 137  K 4.9  CL 108  CO2 20*  GLUCOSE 97  BUN 23*  CREATININE 1.27*  CALCIUM 8.9   ------------------------------------------------------------------------------------------------------------------  -------------------------------------------------------------Cardiac Enzymes:  Recent Labs Lab 01/20/16 2127  TROPONINI <0.03    --------------------------------------------------------------------------------------------------------------- Urine analysis:    Component Value Date/Time   COLORURINE YELLOW 12/24/2015 [place] 12/24/2015 1612   LABSPEC 1.025 12/24/2015 1612   PHURINE 6.0 12/24/2015 [place] 12/24/2015 1612  HGBUR MODERATE (A) 12/24/2015 1612   BILIRUBINUR NEGATIVE 12/24/2015 1612    KETONESUR NEGATIVE 12/24/2015 1612   PROTEINUR NEGATIVE 12/24/2015 1612   NITRITE NEGATIVE 12/24/2015 1612   LEUKOCYTESUR SMALL (A) 12/24/2015 1612      Imaging Results:    Dg Chest 2 View  Result Date: 01/20/2016 CLINICAL DATA:  Productive cough and shortness of breath for 2 weeks. EXAM: CHEST  2 VIEW COMPARISON:  12/24/2015 chest radiographs and CTA FINDINGS: Right jugular Port-A-Cath remains in place with tip terminating over the lower SVC. The cardiomediastinal silhouette is within normal limits. Aortic atherosclerosis is noted. The lungs remain hyperinflated with a similar appearance of biapical scarring including calcification in the right apex. There is no evidence of acute airspace consolidation, edema, pleural effusion, or pneumothorax. Prior cervical spine fusion. IMPRESSION: 1. No active cardiopulmonary disease. 2. Aortic atherosclerosis. Electronically Signed   By: Logan Bores M.D.   On: 01/20/2016 21:33    My personal review of EKG: Rhythm - Atrial fibrillation   Assessment & Plan:    Active Problems:   Atrial fibrillation (HCC)   Chronic anticoagulation-Xarelto   COPD with acute exacerbation (HCC)   COPD exacerbation ([place])   1. COPD exacerbation- placed under observation, start Xopenex nebulizers every 6 hours, albuterol nebulizers every 6 hours, Solu-Medrol 60 mg IV every 6 hours. Mucinex 1 tablet by mouth twice a day. 2. Diarrhea- likely status post chemotherapy, will check stool for C. difficile PCR. 3. Chronic atrial fibrillation-CHA2DS2VASc score is 3, heart rate is controlled, continue Cardizem, Xarelto 4. Pancreatic cancer- started on chemotherapy, followed by oncology as outpatient. 5. Anxiety- patient is on Lexapro at home, will give 1 dose of Xanax 0.5 mg. 6. Hypertension- continue metoprolol 37.5 mg twice a day   DVT Prophylaxis-   Continue Xarelto  AM Labs Ordered, also please review Full Orders out of   Family Communication: No family present at  bedside  Code Status:  Full code  Admission status: Observation    Time spent in minutes : 60 minutes   Miho Monda S M.D on 01/20/2016 at 11:31 PM  Between 7am to 7pm - Pager - [phone]. After 7pm go to [email or web address] - password [facility]  [facility] - Office  [phone]

## 2016-01-20 NOTE — ED Notes (Signed)
Pt sitting in bed with an oxygen saturation of 100%. Ambulated pt to the doorway of the room and pt began to feel lightheaded and stated "I feel like I could fall". Oxygen sat dropped to 89%. Pt back in bed on nasal canula with ox sat of 97%.

## 2016-01-20 NOTE — ED Provider Notes (Signed)
[facility] DEPT Provider Note   CSN: [id number] Arrival date & time: 01/20/16  2023     History   Chief Complaint Chief Complaint  Patient presents with  . Respiratory Distress    HPI Lindsay Salazar is a 65 y.o. female.  HPI  Pt was seen at 2035.  Per EMS and pt, c/o gradual onset and worsening of persistent cough, wheezing and SOB for the past 2 weeks.  Describes her symptoms as "my COPD."  Has been using home O2 N/V, MDI and nebs with transient relief. States her symptoms worsened after she was smoking cigarettes this week. EMS gave short neb en route with improvement in symptoms. Denies CP/palpitations, no back pain, no abd pain, no N/V/D, no fevers, no rash.    Past Medical History:  Diagnosis Date  . Anginal pain ([place])    thought she was having   heartburn  . Arthritis    bursitis , fibromyalgia  . Arthritis    "right hip" (11/27/2015)  . Asthma   . Atrial fibrillation ([employer])   . Chronic bronchitis ([place])   . Chronic right hip pain   . COPD (chronic obstructive pulmonary disease) ([place])    wears home o2 prn  . Daily headache    "last 2-3 months" (11/27/2015)  . Exposure to TB    "mom had it when she was pregnant with me"  . Fibromyalgia   . GERD (gastroesophageal reflux disease)    use to have it but not now  . Heart murmur    78-  20 years old  . Hypertension   . On home oxygen therapy    11/27/2015 "whenever I need it; don't know how many liters"  . Pancreatic cancer ([place])   . Pneumonia    "several times" (11/27/2015)  . Pneumothorax     Patient Active Problem List   Diagnosis Date Noted  . Pancreatic cancer ([place]) 12/07/2015  . Epigastric pain   . Abdominal pain 11/27/2015  . Pancreatic mass 11/27/2015  . Hypokalemia 11/27/2015  . Leukocytosis 11/27/2015  . Chronic anticoagulation-Xarelto 09/06/2015  . Gastrointestinal hemorrhage with melena 08/08/2015  . Acute blood loss anemia 08/08/2015  . Chronic obstructive pulmonary disease (COPD) ([place])  08/08/2015  . Chest pain 08/08/2015  . Dyspnea   . Elevated troponin I level 07/17/2015  . Hyperglycemia 07/17/2015  . Anemia 07/17/2015  . Atrial fibrillation ([place]) 07/17/2015  . Atrial flutter ([place]) 06/27/2015  . Atrial fibrillation with RVR ([place]) 06/27/2015  . COPD with exacerbation ([place]) 06/27/2015  . Hypertensive urgency 06/27/2015  . Tobacco abuse 06/27/2015  . Elevated troponin 06/27/2015  . CAP (community acquired pneumonia)   . OSTEOPOROSIS 04/10/2009  . WEIGHT LOSS 04/10/2009    Past Surgical History:  Procedure Laterality Date  . ANTERIOR CERVICAL DECOMP/DISCECTOMY FUSION     "put 4 screws in"  . BACK SURGERY    . CARDIAC CATHETERIZATION  08/2004   Archie Endo 06/18/2010  . CARDIOVERSION N/A 07/18/2015   Procedure: CARDIOVERSION;  Surgeon: Josue Hector, MD;  Location[facility];  Service: Cardiovascular;  Laterality: N/A;  . ESOPHAGOGASTRODUODENOSCOPY N/A 08/09/2015   Procedure: ESOPHAGOGASTRODUODENOSCOPY (EGD);  Surgeon: Rogene Houston, MD;  Location: [facility] SUITE;  Service: Endoscopy;  Laterality: N/A;  . EUS N/A 11/29/2015   Procedure: UPPER ENDOSCOPIC ULTRASOUND (EUS) RADIAL;  Surgeon: Milus Banister, MD;  Location: [facility] END[facility];  Service: Endoscopy;  Laterality: N/A;  . IR GENERIC HISTORICAL  12/18/2015   IR US GUIDE VASC ACCESS  RIGHT 12/18/2015 Sandi Mariscal, MD [facility]  . IR GENERIC HISTORICAL  12/18/2015   IR FLUORO GUIDE PORT INSERTION RIGHT 12/18/2015 Sandi Mariscal, MD [facility]  . TONSILLECTOMY    . TUBAL LIGATION      OB History    Gravida Para Term Preterm AB Living   2         2   SAB TAB Ectopic Multiple Live Births                   Home Medications    Prior to Admission medications   Medication Sig Start Date End Date Taking? Authorizing Provider  albuterol (PROVENTIL) (2.5 MG/3ML) 0.083% nebulizer solution Take 3 mLs (2.5 mg total) by nebulization every 6 (six) hours as needed for wheezing or shortness of breath. 12/03/15   Ripudeep  Krystal Eaton, MD  azithromycin (ZITHROMAX) 250 MG tablet As directed 12/26/15   Manon Hilding Kefalas, PA-C  benzonatate (TESSALON) 100 MG capsule Take 1 capsule (100 mg total) by mouth 3 (three) times daily. 12/03/15   Ripudeep Krystal Eaton, MD  COMBIVENT RESPIMAT 20-100 MCG/ACT AERS respimat Inhale 1 puff into the lungs 2 (two) times daily. 12/03/15   Ripudeep Krystal Eaton, MD  diltiazem (CARDIZEM CD) 240 MG 24 hr capsule Take 1 capsule (240 mg total) by mouth daily. 12/03/15   Ripudeep Krystal Eaton, MD  escitalopram (LEXAPRO) 10 MG tablet Take 1 tablet (10 mg total) by mouth every evening. 12/03/15   Ripudeep Krystal Eaton, MD  furosemide (LASIX) 20 MG tablet Take 1 tablet (20 mg total) by mouth daily as needed for edema. Patient taking differently: Take 20 mg by mouth daily as needed for fluid or edema.  07/06/15   Arnoldo Lenis, MD  Gemcitabine HCl (GEMZAR IV) Inject into the vein. Day 1, day 8, day 15, every 28 days    Historical Provider, MD  HYDROcodone-acetaminophen (NORCO/VICODIN) 5-325 MG tablet Take 1 tablet by mouth every 6 (six) hours as needed for moderate pain (Must last 30 days.Do not take and drive a car or use machinery.). 01/10/16   Sanjuana Kava, MD  ipratropium-albuterol (DUONEB) 0.5-2.5 (3) MG/3ML SOLN Inhale 3 mLs into the lungs every 6 (six) hours as needed (for shortness of breath). 12/03/15   Ripudeep Krystal Eaton, MD  lidocaine-prilocaine (EMLA) cream Apply to affected area once 12/10/15   Patrici Ranks, MD  metoprolol tartrate 37.5 MG TABS Take 37.5 mg by mouth 2 (two) times daily. 12/03/15   Ripudeep Krystal Eaton, MD  nicotine (NICODERM CQ - DOSED IN MG/24 HOURS) 14 mg/24hr patch Place 1 patch (14 mg total) onto the skin daily. 12/03/15   Ripudeep Krystal Eaton, MD  ondansetron (ZOFRAN) 8 MG tablet Take 1 tablet (8 mg total) by mouth 2 (two) times daily as needed (Nausea or vomiting). 12/10/15   Patrici Ranks, MD  PACLitaxel Protein-Bound Part (ABRAXANE IV) Inject into the vein. Day 1, day 8, day 15, every 28 days     Historical Provider, MD  pantoprazole (PROTONIX) 40 MG tablet Take 1 tablet (40 mg total) by mouth daily. 12/03/15   Ripudeep Krystal Eaton, MD  potassium chloride SA (K-DUR,KLOR-CON) 20 MEQ tablet Take 2 tablets (40 mEq total) by mouth 2 (two) times daily. 01/02/16 04/01/16  Baird Cancer, PA-C  prochlorperazine (COMPAZINE) 10 MG tablet Take 1 tablet (10 mg total) by mouth every 6 (six) hours as needed (Nausea or vomiting). 12/10/15   Patrici Ranks, MD  rivaroxaban (XARELTO) 20  MG TABS tablet Take 1 tablet (20 mg total) by mouth daily with supper. 07/19/15   Reyne Dumas, MD  SYMBICORT 160-4.5 MCG/ACT inhaler Inhale 1 puff into the lungs 2 (two) times daily. 12/03/15   Ripudeep Krystal Eaton, MD    Family History Family History  Problem Relation Age of Onset  . COPD Mother   . Diabetes Father   . Stroke Father     Social History Social History  Substance Use Topics  . Smoking status: Former Smoker    Packs/day: 0.50    Years: 46.00    Types: Cigarettes    Start date: 10/09/1969    Quit date: 11/29/2015  . Smokeless tobacco: Never Used  . Alcohol use No     Comment: quit 30 years ago     Allergies   Ibuprofen; Penicillins; Tiotropium bromide monohydrate; and Tramadol   Review of Systems Review of Systems ROS: Statement: All systems negative except as marked or noted in the HPI; Constitutional: Negative for fever and chills. ; ; Eyes: Negative for eye pain, redness and discharge. ; ; ENMT: Negative for ear pain, hoarseness, nasal congestion, sinus pressure and sore throat. ; ; Cardiovascular: Negative for chest pain, palpitations, diaphoresis, and peripheral edema. ; ; Respiratory: +cough, wheezing, SOB. Negative for stridor. ; ; Gastrointestinal: Negative for nausea, vomiting, diarrhea, abdominal pain, blood in stool, hematemesis, jaundice and rectal bleeding. . ; ; Genitourinary: Negative for dysuria, flank pain and hematuria. ; ; Musculoskeletal: Negative for back pain and neck pain.  Negative for swelling and trauma.; ; Skin: Negative for pruritus, rash, abrasions, blisters, bruising and skin lesion.; ; Neuro: Negative for headache, lightheadedness and neck stiffness. Negative for weakness, altered level of consciousness, altered mental status, extremity weakness, paresthesias, involuntary movement, seizure and syncope.      Physical Exam Updated Vital Signs BP 117/69 (BP Location: Left Arm)   Pulse 73   Temp 98.6 F (37 C) (Oral)   Resp (!) 28   Ht 5\' 3"  (1.6 m)   Wt 121 lb (54.9 kg)   SpO2 100%   BMI 21.43 kg/m   Physical Exam 2040: Physical examination:  Nursing notes reviewed; Vital signs and O2 SAT reviewed;  Constitutional: Well developed, Well nourished, Well hydrated, In no acute distress; Head:  Normocephalic, atraumatic; Eyes: EOMI, PERRL, No scleral icterus; ENMT: Mouth and pharynx normal, Mucous membranes moist; Neck: Supple, Full range of motion, No lymphadenopathy; Cardiovascular: Irregular rate and rhythm, No gallop; Respiratory: Breath sounds diminished & equal bilaterally, faint wheezes.  Speaking full sentences, Normal respiratory effort/excursion; Chest: Nontender, Movement normal; Abdomen: Soft, Nontender, Nondistended, Normal bowel sounds; Genitourinary: No CVA tenderness; Extremities: Pulses normal, No tenderness, No edema, No calf edema or asymmetry.; Neuro: AA&Ox3, Major CN grossly intact.  Speech clear. No gross focal motor or sensory deficits in extremities.; Skin: Color normal, Warm, Dry.      ED Treatments / Results  Labs (all labs ordered are listed, but only abnormal results are displayed)   EKG  EKG Interpretation  Date/Time:  Sunday January 20 2016 20:30:26 EST Ventricular Rate:  85 PR Interval:    QRS Duration: 89 QT Interval:  387 QTC Calculation: 477 R Axis:   78 Text Interpretation:  Atrial fibrillation Ventricular premature complex Borderline repolarization abnormality Baseline wander When compared with ECG of 11/29/2015  No significant change was found Confirmed by [facility]  MD, Nunzio Cory [phone]) on 01/20/2016 8:40:43 PM       Radiology   Procedures Procedures (including critical care  time)  Medications Ordered in ED Medications - No data to display   Initial Impression / Assessment and Plan / ED Course  I have reviewed the triage vital signs and the nursing notes.  Pertinent labs & imaging results that were available during my care of the patient were reviewed by me and considered in my medical decision making (see chart for details).  MDM Reviewed: nursing note, previous chart and vitals Reviewed previous: labs and ECG Interpretation: labs, ECG and x-ray Total time providing critical care: 30-74 minutes. This excludes time spent performing separately reportable procedures and services. Consults: admitting MD   CRITICAL CARE Performed by: Alfonzo Feller Total critical care time: 35 minutes Critical care time was exclusive of separately billable procedures and treating other patients. Critical care was necessary to treat or prevent imminent or life-threatening deterioration. Critical care was time spent personally by me on the following activities: development of treatment plan with patient and/or surrogate as well as nursing, discussions with consultants, evaluation of patient's response to treatment, examination of patient, obtaining history from patient or surrogate, ordering and performing treatments and interventions, ordering and review of laboratory studies, ordering and review of radiographic studies, pulse oximetry and re-evaluation of patient's condition.  Results for orders placed or performed during the hospital encounter of [id number]  Basic metabolic panel  Result Value Ref Range   Sodium 137 135 - 145 mmol/L   Potassium 4.9 3.5 - 5.1 mmol/L   Chloride 108 101 - 111 mmol/L   CO2 20 (L) 22 - 32 mmol/L   Glucose, Bld 97 65 - 99 mg/dL   BUN 23 (H) 6 - 20 mg/dL   Creatinine, Ser  1.27 (H) 0.44 - 1.00 mg/dL   Calcium 8.9 8.9 - 10.3 mg/dL   GFR calc non Af Amer 43 (L) >60 mL/min   GFR calc Af Amer 49 (L) >60 mL/min   Anion gap 9 5 - 15  Troponin I  Result Value Ref Range   Troponin I <0.03 <0.03 ng/mL  CBC with Differential  Result Value Ref Range   WBC 11.0 (H) 4.0 - 10.5 K/uL   RBC 3.75 (L) 3.87 - 5.11 MIL/uL   Hemoglobin 9.8 (L) 12.0 - 15.0 g/dL   HCT 31.7 (L) 36.0 - 46.0 %   MCV 84.5 78.0 - 100.0 fL   MCH 26.1 26.0 - 34.0 pg   MCHC 30.9 30.0 - 36.0 g/dL   RDW 21.1 (H) 11.5 - 15.5 %   Platelets 356 150 - 400 K/uL   Neutrophils Relative % 42 %   Lymphocytes Relative 39 %   Monocytes Relative 17 %   Eosinophils Relative 1 %   Basophils Relative 1 %   Neutro Abs 4.6 1.7 - 7.7 K/uL   Lymphs Abs 4.3 (H) 0.7 - 4.0 K/uL   Monocytes Absolute 1.9 (H) 0.1 - 1.0 K/uL   Eosinophils Absolute 0.1 0.0 - 0.7 K/uL   Basophils Absolute 0.1 0.0 - 0.1 K/uL   RBC Morphology POLYCHROMASIA PRESENT    WBC Morphology MILD LEFT SHIFT (1-5% METAS, OCC MYELO, OCC BANDS)   Lactic acid, plasma  Result Value Ref Range   Lactic Acid, Venous 1.9 0.5 - 1.9 mmol/L   Dg Chest 2 View Result Date: 01/20/2016 CLINICAL DATA:  Productive cough and shortness of breath for 2 weeks. EXAM: CHEST  2 VIEW COMPARISON:  12/24/2015 chest radiographs and CTA FINDINGS: Right jugular Port-A-Cath remains in place with tip terminating over the lower SVC. The cardiomediastinal silhouette  is within normal limits. Aortic atherosclerosis is noted. The lungs remain hyperinflated with a similar appearance of biapical scarring including calcification in the right apex. There is no evidence of acute airspace consolidation, edema, pleural effusion, or pneumothorax. Prior cervical spine fusion. IMPRESSION: 1. No active cardiopulmonary disease. 2. Aortic atherosclerosis. Electronically Signed   By: Logan Bores M.D.   On: 01/20/2016 21:33    2305: On arrival: pt sitting upright, Sats 97 % O2 2L N/C, lungs  diminished. IV solumedrol and hour long neb started. After neb: pt appears more comfortable at rest, Sats 99 % on O2 2L N/C, lungs continue diminished. Pt ambulated from bed to door in room: pt's O2 Sats dropped to 89 % on O2 2L N/C with pt c/o increasing SOB, with increasing HR and RR. Pt escorted back to stretcher with O2 Sats slowly increasing to 100 % on O2 2L N/C.    T/C to [facility]. Darrick Meigs, case discussed, including:  HPI, pertinent PM/SHx, VS/PE, dx testing, ED course and treatment:  Agreeable to admit, requests to write temporary orders, obtain observation medical bed to team APAdmits.    Final Clinical Impressions(s) / ED Diagnoses   Final diagnoses:  None    New Prescriptions New Prescriptions   No medications on file     Francine Graven, DO 01/23/16 1609

## 2016-01-20 NOTE — ED Triage Notes (Signed)
Pt brought in by [facility] for c/o respiratory distress; pt states she has been feeling bad for the last couple of weeks; pt's O2 sats 92% on scene; pt given albuterol and combivent en route to hospital

## 2016-01-21 ENCOUNTER — Encounter (HOSPITAL_COMMUNITY): Payer: Self-pay | Admitting: *Deleted

## 2016-01-21 DIAGNOSIS — Z7901 Long term (current) use of anticoagulants: Secondary | ICD-10-CM | POA: Diagnosis not present

## 2016-01-21 DIAGNOSIS — I10 Essential (primary) hypertension: Secondary | ICD-10-CM | POA: Diagnosis present

## 2016-01-21 DIAGNOSIS — Z981 Arthrodesis status: Secondary | ICD-10-CM | POA: Diagnosis not present

## 2016-01-21 DIAGNOSIS — J441 Chronic obstructive pulmonary disease with (acute) exacerbation: Secondary | ICD-10-CM | POA: Diagnosis present

## 2016-01-21 DIAGNOSIS — Z8507 Personal history of malignant neoplasm of pancreas: Secondary | ICD-10-CM | POA: Diagnosis not present

## 2016-01-21 DIAGNOSIS — Z201 Contact with and (suspected) exposure to tuberculosis: Secondary | ICD-10-CM | POA: Diagnosis present

## 2016-01-21 DIAGNOSIS — K219 Gastro-esophageal reflux disease without esophagitis: Secondary | ICD-10-CM | POA: Diagnosis present

## 2016-01-21 DIAGNOSIS — I4891 Unspecified atrial fibrillation: Secondary | ICD-10-CM | POA: Diagnosis not present

## 2016-01-21 DIAGNOSIS — M797 Fibromyalgia: Secondary | ICD-10-CM | POA: Diagnosis present

## 2016-01-21 DIAGNOSIS — Z9221 Personal history of antineoplastic chemotherapy: Secondary | ICD-10-CM | POA: Diagnosis not present

## 2016-01-21 DIAGNOSIS — Z9981 Dependence on supplemental oxygen: Secondary | ICD-10-CM | POA: Diagnosis not present

## 2016-01-21 DIAGNOSIS — F17210 Nicotine dependence, cigarettes, uncomplicated: Secondary | ICD-10-CM | POA: Diagnosis present

## 2016-01-21 DIAGNOSIS — Z880 Allergy status to penicillin: Secondary | ICD-10-CM | POA: Diagnosis not present

## 2016-01-21 DIAGNOSIS — Z888 Allergy status to other drugs, medicaments and biological substances status: Secondary | ICD-10-CM | POA: Diagnosis not present

## 2016-01-21 DIAGNOSIS — R197 Diarrhea, unspecified: Secondary | ICD-10-CM | POA: Diagnosis present

## 2016-01-21 DIAGNOSIS — I482 Chronic atrial fibrillation: Secondary | ICD-10-CM | POA: Diagnosis present

## 2016-01-21 DIAGNOSIS — Z79899 Other long term (current) drug therapy: Secondary | ICD-10-CM | POA: Diagnosis not present

## 2016-01-21 DIAGNOSIS — Z825 Family history of asthma and other chronic lower respiratory diseases: Secondary | ICD-10-CM | POA: Diagnosis not present

## 2016-01-21 DIAGNOSIS — Z7951 Long term (current) use of inhaled steroids: Secondary | ICD-10-CM | POA: Diagnosis not present

## 2016-01-21 LAB — COMPREHENSIVE METABOLIC PANEL
ALK PHOS: 84 U/L (ref 38–126)
ALT: 26 U/L (ref 14–54)
ANION GAP: 9 (ref 5–15)
AST: 27 U/L (ref 15–41)
Albumin: 3.2 g/dL — ABNORMAL LOW (ref 3.5–5.0)
BILIRUBIN TOTAL: 0.2 mg/dL — AB (ref 0.3–1.2)
BUN: 30 mg/dL — ABNORMAL HIGH (ref 6–20)
CALCIUM: 8.6 mg/dL — AB (ref 8.9–10.3)
CO2: 20 mmol/L — ABNORMAL LOW (ref 22–32)
CREATININE: 1.27 mg/dL — AB (ref 0.44–1.00)
Chloride: 104 mmol/L (ref 101–111)
GFR, EST AFRICAN AMERICAN: 49 mL/min — AB (ref >60)
GFR, EST NON AFRICAN AMERICAN: 43 mL/min — AB (ref >60)
Glucose, Bld: 184 mg/dL — ABNORMAL HIGH (ref 65–99)
Potassium: 4.3 mmol/L (ref 3.5–5.1)
Sodium: 133 mmol/L — ABNORMAL LOW (ref 135–145)
TOTAL PROTEIN: 6.7 g/dL (ref 6.5–8.1)

## 2016-01-21 LAB — CBC
HEMATOCRIT: 29.6 % — AB (ref 36.0–46.0)
HEMOGLOBIN: 9.3 g/dL — AB (ref 12.0–15.0)
MCH: 26.3 pg (ref 26.0–34.0)
MCHC: 31.4 g/dL (ref 30.0–36.0)
MCV: 83.9 fL (ref 78.0–100.0)
Platelets: 358 10*3/uL (ref 150–400)
RBC: 3.53 MIL/uL — ABNORMAL LOW (ref 3.87–5.11)
RDW: 21.3 % — ABNORMAL HIGH (ref 11.5–15.5)
WBC: 8.9 10*3/uL (ref 4.0–10.5)

## 2016-01-21 LAB — LACTIC ACID, PLASMA: Lactic Acid, Venous: 1.5 mmol/L (ref 0.5–1.9)

## 2016-01-21 MED ORDER — GUAIFENESIN 100 MG/5ML PO SOLN
10.0000 mL | ORAL | Status: DC | PRN
  Administered 2016-01-21 – 2016-01-23 (×8): 200 mg via ORAL
  Filled 2016-01-21 (×2): qty 5
  Filled 2016-01-21 (×3): qty 10
  Filled 2016-01-21 (×2): qty 5
  Filled 2016-01-21: qty 10
  Filled 2016-01-21: qty 50

## 2016-01-21 MED ORDER — SODIUM CHLORIDE 0.9 % IV SOLN
INTRAVENOUS | Status: DC
  Administered 2016-01-21: 02:00:00 via INTRAVENOUS

## 2016-01-21 MED ORDER — ONDANSETRON HCL 4 MG/2ML IJ SOLN: 4.0000 mg | Freq: Four times a day (QID) | INTRAMUSCULAR | Status: DC | PRN

## 2016-01-21 MED ORDER — GUAIFENESIN ER 600 MG PO TB12
600.0000 mg | ORAL_TABLET | Freq: Two times a day (BID) | ORAL | Status: DC
  Administered 2016-01-21 – 2016-01-23 (×6): 600 mg via ORAL
  Filled 2016-01-21 (×6): qty 1

## 2016-01-21 MED ORDER — HYDROCODONE-ACETAMINOPHEN 5-325 MG PO TABS
1.0000 | ORAL_TABLET | Freq: Four times a day (QID) | ORAL | Status: DC | PRN
  Administered 2016-01-21 – 2016-01-23 (×11): 1 via ORAL
  Filled 2016-01-21 (×11): qty 1

## 2016-01-21 MED ORDER — DILTIAZEM HCL ER COATED BEADS 240 MG PO CP24
240.0000 mg | ORAL_CAPSULE | Freq: Every day | ORAL | Status: DC
  Administered 2016-01-21 – 2016-01-23 (×3): 240 mg via ORAL
  Filled 2016-01-21 (×3): qty 1

## 2016-01-21 MED ORDER — RIVAROXABAN 20 MG PO TABS
20.0000 mg | ORAL_TABLET | Freq: Every day | ORAL | Status: DC
  Administered 2016-01-21 – 2016-01-22 (×3): 20 mg via ORAL
  Filled 2016-01-21 (×3): qty 1

## 2016-01-21 MED ORDER — ACETAMINOPHEN 650 MG RE SUPP: 650.0000 mg | Freq: Four times a day (QID) | RECTAL | Status: DC | PRN

## 2016-01-21 MED ORDER — METHYLPREDNISOLONE SODIUM SUCC 125 MG IJ SOLR
60.0000 mg | Freq: Four times a day (QID) | INTRAMUSCULAR | Status: DC
  Administered 2016-01-21 – 2016-01-23 (×10): 60 mg via INTRAVENOUS
  Filled 2016-01-21 (×10): qty 2

## 2016-01-21 MED ORDER — SODIUM CHLORIDE 0.9 % IV SOLN
INTRAVENOUS | Status: DC
  Administered 2016-01-21: 23:00:00 via INTRAVENOUS
  Filled 2016-01-21 (×2): qty 1000

## 2016-01-21 MED ORDER — IPRATROPIUM BROMIDE 0.02 % IN SOLN
0.5000 mg | Freq: Four times a day (QID) | RESPIRATORY_TRACT | Status: DC
  Administered 2016-01-21 – 2016-01-22 (×8): 0.5 mg via RESPIRATORY_TRACT
  Filled 2016-01-21 (×8): qty 2.5

## 2016-01-21 MED ORDER — ALBUTEROL SULFATE (2.5 MG/3ML) 0.083% IN NEBU: 2.5000 mg | INHALATION_SOLUTION | RESPIRATORY_TRACT | Status: DC

## 2016-01-21 MED ORDER — ACETAMINOPHEN 325 MG PO TABS: 650.0000 mg | ORAL_TABLET | Freq: Four times a day (QID) | ORAL | Status: DC | PRN

## 2016-01-21 MED ORDER — NICOTINE 14 MG/24HR TD PT24
14.0000 mg | MEDICATED_PATCH | Freq: Every day | TRANSDERMAL | Status: DC
  Administered 2016-01-21 – 2016-01-23 (×3): 14 mg via TRANSDERMAL
  Filled 2016-01-21 (×3): qty 1

## 2016-01-21 MED ORDER — ONDANSETRON HCL 4 MG PO TABS: 4.0000 mg | ORAL_TABLET | Freq: Four times a day (QID) | ORAL | Status: DC | PRN

## 2016-01-21 MED ORDER — ESCITALOPRAM OXALATE 10 MG PO TABS
10.0000 mg | ORAL_TABLET | Freq: Every evening | ORAL | Status: DC
  Administered 2016-01-21 – 2016-01-22 (×2): 10 mg via ORAL
  Filled 2016-01-21 (×2): qty 1

## 2016-01-21 MED ORDER — METOPROLOL TARTRATE 25 MG PO TABS
37.5000 mg | ORAL_TABLET | Freq: Two times a day (BID) | ORAL | Status: DC
  Administered 2016-01-21 – 2016-01-23 (×6): 37.5 mg via ORAL
  Filled 2016-01-21 (×6): qty 2

## 2016-01-21 MED ORDER — LEVALBUTEROL HCL 0.63 MG/3ML IN NEBU
0.6300 mg | INHALATION_SOLUTION | Freq: Four times a day (QID) | RESPIRATORY_TRACT | Status: DC
  Administered 2016-01-21 – 2016-01-22 (×8): 0.63 mg via RESPIRATORY_TRACT
  Filled 2016-01-21 (×8): qty 3

## 2016-01-21 MED ORDER — PANTOPRAZOLE SODIUM 40 MG PO TBEC
40.0000 mg | DELAYED_RELEASE_TABLET | Freq: Every day | ORAL | Status: DC
  Administered 2016-01-21 – 2016-01-23 (×3): 40 mg via ORAL
  Filled 2016-01-21 (×3): qty 1

## 2016-01-21 NOTE — Progress Notes (Signed)
PROGRESS NOTE    Lindsay Salazar  [id number] DOB: 12-16-1950 DOA: 01/20/2016 PCP: [name], PA-C    Brief Narrative: 65 yo admitted for COPD exacerbation.  She was given IV Steroid, neb, and Mucinex.  She has hx of afib on Xarelto and Cardiazem, along with hx of pancreatitic cancer, HTN, and anxiety.     Assessment & Plan:   Active Problems:   Atrial fibrillation (HCC)   Chronic anticoagulation-Xarelto   COPD with acute exacerbation (H[facility])   COPD exacerbation ([place])   1. COPD Exacerbation:  Doing better.  Will continue with current Tx.    2/  Afib:  Rate is controlled.  Will continue with Cardiazem and Xarelto. 3/  Diarrhea: Suspect from chemotherapy.  C diff pending. 4.   HTN:  Cotninue with BB.  BP Controlled.   DVT prophylaxis: Xarelto.  Code Status: FULL CODE.  Family Communication: None.  Disposition Plan: To home when appropriate.  Consultants: None.   Procedures:   None.   Antimicrobials: Anti-infectives    None       Subjective: Feeling a little better.    Objective: Vitals:   01/21/16 0102 01/21/16 0145 01/21/16 0500 01/21/16 0744  BP: (!) 127/49  131/79   Pulse: (!) 120  (!) 125   Resp:   (!) 24   Temp: 98.2 F (36.8 C)  98.3 F (36.8 C)   TempSrc: Oral  Oral   SpO2: 90% 95% 100% 100%  Weight: 58.8 kg (129 lb 10.1 oz)     Height: 5\' 3"  (1.6 m)       Intake/Output Summary (Last 24 hours) at 01/21/16 0958 Last data filed at 01/21/16 0649  Gross per 24 hour  Intake              260 ml  Output                0 ml  Net              260 ml   Filed Weights   01/20/16 2025 01/20/16 2038 01/21/16 0102  Weight: 55.8 kg (123 lb) 54.9 kg (121 lb) 58.8 kg (129 lb 10.1 oz)    Examination:  General exam: Appears calm and comfortable  Respiratory system: Clear to auscultation. Respiratory effort normal. Cardiovascular system: S1 & S2 heard, RRR. No JVD, murmurs, rubs, gallops or clicks. No pedal edema. Gastrointestinal system: Abdomen is  nondistended, soft and nontender. No organomegaly or masses felt. Normal bowel sounds heard. Central nervous system: Alert and oriented. No focal neurological deficits. Extremities: Symmetric 5 x 5 power. Skin: No rashes, lesions or ulcers Psychiatry: Judgement and insight appear normal. Mood & affect appropriate.   Data Reviewed: I have personally reviewed following labs and imaging studies  CBC:  Recent Labs Lab 01/20/16 2127 01/21/16 0506  WBC 11.0* 8.9  NEUTROABS 4.6  --   HGB 9.8* 9.3*  HCT 31.7* 29.6*  MCV 84.5 83.9  PLT 356 [id number]   Basic Metabolic Panel:  Recent Labs Lab 01/20/16 2127 01/21/16 0506  NA 137 133*  K 4.9 4.3  CL 108 104  CO2 20* 20*  GLUCOSE 97 184*  BUN 23* 30*  CREATININE 1.27* 1.27*  CALCIUM 8.9 8.6*   GFR: Estimated Creatinine Clearance: 35.6 mL/min (by C-G formula based on SCr of 1.27 mg/dL (H)). Liver Function Tests:  Recent Labs Lab 01/21/16 0506  AST 27  ALT 26  ALKPHOS 84  BILITOT 0.2*  PROT 6.7  ALBUMIN  3.2*   Cardiac Enzymes:  Recent Labs Lab 01/20/16 2127  TROPONINI <0.03   Sepsis Labs:  Recent Labs Lab 01/20/16 2127 01/20/16 2342  LATICACIDVEN 1.9 1.5   Radiology Studies: Dg Chest 2 View  Result Date: 01/20/2016 CLINICAL DATA:  Productive cough and shortness of breath for 2 weeks. EXAM: CHEST  2 VIEW COMPARISON:  12/24/2015 chest radiographs and CTA FINDINGS: Right jugular Port-A-Cath remains in place with tip terminating over the lower SVC. The cardiomediastinal silhouette is within normal limits. Aortic atherosclerosis is noted. The lungs remain hyperinflated with a similar appearance of biapical scarring including calcification in the right apex. There is no evidence of acute airspace consolidation, edema, pleural effusion, or pneumothorax. Prior cervical spine fusion. IMPRESSION: 1. No active cardiopulmonary disease. 2. Aortic atherosclerosis. Electronically Signed   By: Logan Bores M.D.   On: 01/20/2016 21:33      Scheduled Meds: . diltiazem  240 mg Oral Daily  . escitalopram  10 mg Oral QPM  . guaiFENesin  600 mg Oral BID  . ipratropium  0.5 mg Nebulization Q6H  . levalbuterol  0.63 mg Nebulization Q6H  . methylPREDNISolone (SOLU-MEDROL) injection  60 mg Intravenous Q6H  . metoprolol tartrate  37.5 mg Oral BID  . nicotine  14 mg Transdermal Daily  . pantoprazole  40 mg Oral Daily  . rivaroxaban  20 mg Oral Q supper   Continuous Infusions: . sodium chloride 50 mL/hr at 01/21/16 0137     LOS: 0 days   Rahil Passey, MD [facility].   If 7PM-7AM, please contact night-coverage [email or web address] Password [email or web address] 01/21/2016, 9:58 AM

## 2016-01-21 NOTE — Care Management Note (Addendum)
Case Management Note  Patient Details  Name: ROMI FULLENWIDER MRN: [id number] Date of Birth: 1950/06/30  Subjective/Objective:  Patient adm from home COPD exacerbation. Has home oxygen. She lives with her boyfriend and his brother. She reports going to [facility] for primary care. She does not have home health and states she does not need home health at this time.             Action/Plan: Anticipate DC home with self care.  Patient would like a walker. Notified Romualdo Bolk of [facility] notified and will obtain order from chart.   Expected Discharge Date:  01/22/16               Expected Discharge Plan:  Home/Self Care  In-House Referral:  NA  Discharge planning Services  CM Consult  Post Acute Care Choice:  NA Choice offered to:  NA  DME Arranged:    DME Agency:     HH Arranged:    HH Agency:     Status of Service:  Completed, signed off  If discussed at [employer] of Stay Meetings, dates discussed:    Additional Comments:  Natassia Guthridge, Chauncey Reading, RN 01/21/2016, 10:49 AM

## 2016-01-21 NOTE — Care Management Obs Status (Signed)
[place] NOTIFICATION   Patient Details  Name: KATHELEEN HAMSON MRN: [id number] Date of Birth: 1950/05/17   Medicare Observation Status Notification Given:  Yes    Jacorion Klem, Chauncey Reading, RN 01/21/2016, 11:05 AM

## 2016-01-22 ENCOUNTER — Telehealth (HOSPITAL_COMMUNITY): Payer: Self-pay | Admitting: *Deleted

## 2016-01-22 ENCOUNTER — Telehealth (HOSPITAL_COMMUNITY): Payer: Self-pay

## 2016-01-22 ENCOUNTER — Telehealth: Payer: Self-pay | Admitting: Orthopaedic Surgery

## 2016-01-22 DIAGNOSIS — Z7901 Long term (current) use of anticoagulants: Secondary | ICD-10-CM

## 2016-01-22 MED ORDER — LEVALBUTEROL HCL 0.63 MG/3ML IN NEBU
0.6300 mg | INHALATION_SOLUTION | Freq: Three times a day (TID) | RESPIRATORY_TRACT | Status: DC
  Administered 2016-01-23 (×2): 0.63 mg via RESPIRATORY_TRACT
  Filled 2016-01-22 (×2): qty 3

## 2016-01-22 MED ORDER — SODIUM CHLORIDE 0.9 % IV SOLN
INTRAVENOUS | Status: DC
  Administered 2016-01-22 – 2016-01-23 (×2): via INTRAVENOUS

## 2016-01-22 MED ORDER — IPRATROPIUM BROMIDE 0.02 % IN SOLN
0.5000 mg | Freq: Three times a day (TID) | RESPIRATORY_TRACT | Status: DC
  Administered 2016-01-23 (×2): 0.5 mg via RESPIRATORY_TRACT
  Filled 2016-01-22 (×2): qty 2.5

## 2016-01-22 NOTE — Progress Notes (Signed)
PROGRESS NOTE    Lindsay Salazar  [id number] DOB: [birth year] DOA: 01/20/2016 PCP: [name], PA-C    Brief Narrative: patient admitted for COPD exacerbation with hx of severe COPD on home oxygen.  She is still having significant DOE and audible wheezing. No other complaints.    Assessment & Plan:   Active Problems:   Atrial fibrillation (HCC)   Chronic anticoagulation-Xarelto   COPD with acute exacerbation (HCC)   COPD exacerbation (HCC)   1/   COPD Exacerbation:  Doing better but still wheezing significantly.  Continue with IV Steroids and nebs.   2/  Afib:  Rate is controlled.  Will continue with Cardiazem and Xarelto. 3/   Diarrhea: Suspect from chemotherapy.   4.   HTN:  Cotninue with BB.  BP Controlled.   DVT prophylaxis: Xarelto.  Code Status: FULL CODE.  Family Communication: None.  Disposition Plan: To home when appropriate.  Consultants: None.   Antimicrobials: Anti-infectives    None       Subjective:  Feeling better.  Still having DOE.    Objective: Vitals:   01/21/16 2142 01/22/16 0128 01/22/16 0637 01/22/16 0906  BP: 112/60  118/62   Pulse: 98  86   Resp: 18  18   Temp: 98 F (36.7 C)  98.3 F (36.8 C)   TempSrc: Oral  Oral   SpO2: 100% 98% 97% 97%  Weight:      Height:        Intake/Output Summary (Last 24 hours) at 01/22/16 1024 Last data filed at 01/22/16 0800  Gross per 24 hour  Intake          1001.25 ml  Output              300 ml  Net           701.25 ml   Filed Weights   01/20/16 2025 01/20/16 2038 01/21/16 0102  Weight: 55.8 kg (123 lb) 54.9 kg (121 lb) 58.8 kg (129 lb 10.1 oz)    Examination:  General exam: Appears calm and comfortable  Respiratory system: wheezing bilaterally. Respiratory effort normal. Cardiovascular system: S1 & S2 heard, RRR. No JVD, murmurs, rubs, gallops or clicks. No pedal edema. Gastrointestinal system: Abdomen is nondistended, soft and nontender. No organomegaly or masses felt. Normal  bowel sounds heard. Central nervous system: Alert and oriented. No focal neurological deficits. Extremities: Symmetric 5 x 5 power. Skin: No rashes, lesions or ulcers Psychiatry: Judgement and insight appear normal. Mood & affect appropriate.   Data Reviewed: I have personally reviewed following labs and imaging studies  CBC:  Recent Labs Lab 01/20/16 2127 01/21/16 0506  WBC 11.0* 8.9  NEUTROABS 4.6  --   HGB 9.8* 9.3*  HCT 31.7* 29.6*  MCV 84.5 83.9  PLT 356 [id number]   Basic Metabolic Panel:  Recent Labs Lab 01/20/16 2127 01/21/16 0506  NA 137 133*  K 4.9 4.3  CL 108 104  CO2 20* 20*  GLUCOSE 97 184*  BUN 23* 30*  CREATININE 1.27* 1.27*  CALCIUM 8.9 8.6*   GFR: Estimated Creatinine Clearance: 35.6 mL/min (by C-G formula based on SCr of 1.27 mg/dL (H)). Liver Function Tests:  Recent Labs Lab 01/21/16 0506  AST 27  ALT 26  ALKPHOS 84  BILITOT 0.2*  PROT 6.7  ALBUMIN 3.2*   No results for input(s): LIPASE, AMYLASE in the last 168 hours. No results for input(s): AMMONIA in the last 168 hours. Coagulation Profile: No results for  input(s): INR, PROTIME in the last 168 hours. Cardiac Enzymes:  Recent Labs Lab 01/20/16 2127  TROPONINI <0.03    Recent Labs Lab 01/20/16 2127 01/20/16 2342  LATICACIDVEN 1.9 1.5   Radiology Studies: Dg Chest 2 View  Result Date: 01/20/2016 CLINICAL DATA:  Productive cough and shortness of breath for 2 weeks. EXAM: CHEST  2 VIEW COMPARISON:  12/24/2015 chest radiographs and CTA FINDINGS: Right jugular Port-A-Cath remains in place with tip terminating over the lower SVC. The cardiomediastinal silhouette is within normal limits. Aortic atherosclerosis is noted. The lungs remain hyperinflated with a similar appearance of biapical scarring including calcification in the right apex. There is no evidence of acute airspace consolidation, edema, pleural effusion, or pneumothorax. Prior cervical spine fusion. IMPRESSION: 1. No active  cardiopulmonary disease. 2. Aortic atherosclerosis. Electronically Signed   By: Logan Bores M.D.   On: 01/20/2016 21:33    Scheduled Meds: . diltiazem  240 mg Oral Daily  . escitalopram  10 mg Oral QPM  . guaiFENesin  600 mg Oral BID  . ipratropium  0.5 mg Nebulization Q6H  . levalbuterol  0.63 mg Nebulization Q6H  . methylPREDNISolone (SOLU-MEDROL) injection  60 mg Intravenous Q6H  . metoprolol tartrate  37.5 mg Oral BID  . nicotine  14 mg Transdermal Daily  . pantoprazole  40 mg Oral Daily  . rivaroxaban  20 mg Oral Q supper   Continuous Infusions: . sodium chloride       LOS: 1 day   Arshad Oberholzer, MD FACP Hospitalist.   If 7PM-7AM, please contact night-coverage [email or web address] Password [email or web address] 01/22/2016, 10:24 AM

## 2016-01-22 NOTE — Telephone Encounter (Signed)
Patient called and informed that we have moved her to Thursday at 9:30 for treatment, she verbalized understanding.

## 2016-01-22 NOTE — Telephone Encounter (Signed)
Patient called and told that we will call her in the morning and let her know if we will treat her or not.  She is inpatient in room 302.

## 2016-01-22 NOTE — Telephone Encounter (Signed)
Call received from patient's significant other, notifying that patient will be unable to come to appointment tomorrow, 01/23/16; she is in-patient at [facility].  Wanted Dr Luna Glasgow to know, and that she is re-scheduled after the New Year.

## 2016-01-23 ENCOUNTER — Ambulatory Visit: Payer: Medicare HMO | Admitting: Cardiology

## 2016-01-23 ENCOUNTER — Ambulatory Visit (HOSPITAL_COMMUNITY): Payer: Medicare HMO | Admitting: Oncology

## 2016-01-23 ENCOUNTER — Ambulatory Visit (HOSPITAL_COMMUNITY): Payer: Medicare HMO

## 2016-01-23 ENCOUNTER — Ambulatory Visit: Payer: Medicare HMO | Admitting: Orthopaedic Surgery

## 2016-01-23 ENCOUNTER — Other Ambulatory Visit (HOSPITAL_COMMUNITY): Payer: Self-pay | Admitting: Pharmacist

## 2016-01-23 MED ORDER — PREDNISONE 20 MG PO TABS: 20.0000 mg | ORAL_TABLET | Freq: Two times a day (BID) | ORAL | Status: DC

## 2016-01-23 MED ORDER — GUAIFENESIN 100 MG/5ML PO SOLN: 10.0000 mL | ORAL | 0 refills | Status: DC | PRN

## 2016-01-23 MED ORDER — IPRATROPIUM BROMIDE 0.02 % IN SOLN: 0.5000 mg | Freq: Three times a day (TID) | RESPIRATORY_TRACT | 12 refills | Status: DC

## 2016-01-23 NOTE — Discharge Summary (Signed)
Physician Discharge Summary  Lindsay Salazar [facility] [id number] DOB: 15-Oct-1950 DOA: 01/20/2016  PCP: [name], PA-C  Admit date: 01/20/2016 Discharge date: 01/23/2016  Admitted From: Home.  Disposition:  Home.   Recommendations for Outpatient Follow-up:  1. Follow up with PCP in 1-2 weeks  Home Health: None.  Equipment/Devices: oxygen at home.  Discharge Condition: No DOE, no wheezing.  CODE STATUS: FULL CODE.  Diet recommendation: As tolerated.   Brief/Interim Summary:  Patient was admitted by Dr Darrick Meigs on Jan 20, 2016 for SOB and COPD exacerbation.  As per his H and P:  "  Lindsay Salazar  is a 65 y.o. female, With history of new diagnosis pancreatic cancer, started on chemotherapyGemcitabine/Abraxane , last chemotherapy was on 01/09/2016. Patient says that today she developed panic attack, and had heart and breathing. She has chronic home O2 for COPD. She also has been having diarrhea 4-5 loose bowel movements for past few days. She denies any fever or chills. Denies chest pain. As the patient she ran out of her inhalers at home. She denies nausea vomiting. No abdominal pain.  In the ED, patient was given DuoNeb nebulizer, Solu-Medrol. Patient admits to be anxious.  HOSPITAL COURSE:  Patient was started on IV Steroids, and she was continued for a couple of days, as she was still having wheezing.  Her nebs helped and they were continued.  For her afib, her anticoagulation was continued as well as her cardizem, and her rate remained controlled.   Her diarrhea was felt to be due to her chemotherapy, and it improved as well.  For her HTN, her BB was continued, and it was controlled.  Her wheezing finally improved, and she was given 5 more days of Prednisone at 20mg  BID to take with meals and PPI.  She will follow up with her oncologist for continuation with her chemotherapy, and follow up with her PCP for her general medical care.  Thank you and Good Day.   Discharge Diagnoses:  Active Problems:    Atrial fibrillation (HCC)   Chronic anticoagulation-Xarelto   COPD with acute exacerbation (HCC)   COPD exacerbation (HCC)  Discharge Instructions  Discharge Instructions    Diet - low sodium heart healthy    Complete by:  As directed    Discharge instructions    Complete by:  As directed    Take your medications as instructed.  Follow up with your doctors as scheduled.   Increase activity slowly    Complete by:  As directed      Allergies as of 01/23/2016      Reactions   Ibuprofen Nausea And Vomiting   Penicillins Swelling   Has patient had a PCN reaction causing immediate rash, facial/tongue/throat swelling, SOB or lightheadedness with hypotension: Yes Has patient had a PCN reaction causing severe rash involving mucus membranes or skin necrosis: No Has patient had a PCN reaction that required hospitalization No Has patient had a PCN reaction occurring within the last 10 years: No If all of the above answers are "NO", then may proceed with Cephalosporin use.   Tiotropium Bromide Monohydrate Itching   Tramadol Nausea And Vomiting      Medication List    STOP taking these medications   lidocaine-prilocaine cream Commonly known as:  EMLA   ondansetron 8 MG tablet Commonly known as:  ZOFRAN   prochlorperazine 10 MG tablet Commonly known as:  COMPAZINE     TAKE these medications   ABRAXANE IV Inject into the vein.  Day 1, day 8, day 15, every 28 days   albuterol (2.5 MG/3ML) 0.083% nebulizer solution Commonly known as:  PROVENTIL Take 3 mLs (2.5 mg total) by nebulization every 6 (six) hours as needed for wheezing or shortness of breath.   COMBIVENT RESPIMAT 20-100 MCG/ACT Aers respimat Generic drug:  Ipratropium-Albuterol Inhale 1 puff into the lungs 2 (two) times daily.   ipratropium-albuterol 0.5-2.5 (3) MG/3ML Soln Commonly known as:  DUONEB Inhale 3 mLs into the lungs every 6 (six) hours as needed (for shortness of breath).   diltiazem 240 MG 24 hr  capsule Commonly known as:  CARDIZEM CD Take 1 capsule (240 mg total) by mouth daily.   escitalopram 10 MG tablet Commonly known as:  LEXAPRO Take 1 tablet (10 mg total) by mouth every evening.   furosemide 20 MG tablet Commonly known as:  LASIX Take 1 tablet (20 mg total) by mouth daily as needed for edema. What changed:  reasons to take this   GEMZAR IV Inject into the vein. Day 1, day 8, day 15, every 28 days   guaiFENesin 100 MG/5ML Soln Commonly known as:  ROBITUSSIN Take 10 mLs (200 mg total) by mouth every 4 (four) hours as needed for cough or to loosen phlegm.   HYDROcodone-acetaminophen 5-325 MG tablet Commonly known as:  NORCO/VICODIN Take 1 tablet by mouth every 6 (six) hours as needed for moderate pain (Must last 30 days.Do not take and drive a car or use machinery.).   ipratropium 0.02 % nebulizer solution Commonly known as:  ATROVENT Take 2.5 mLs (0.5 mg total) by nebulization 3 (three) times daily.   Metoprolol Tartrate 37.5 MG Tabs Take 37.5 mg by mouth 2 (two) times daily.   nicotine 14 mg/24hr patch Commonly known as:  NICODERM CQ - dosed in mg/24 hours Place 1 patch (14 mg total) onto the skin daily.   pantoprazole 40 MG tablet Commonly known as:  PROTONIX Take 1 tablet (40 mg total) by mouth daily.   potassium chloride SA 20 MEQ tablet Commonly known as:  K-DUR,KLOR-CON Take 2 tablets (40 mEq total) by mouth 2 (two) times daily.   predniSONE 20 MG tablet Commonly known as:  DELTASONE Take 1 tablet (20 mg total) by mouth 2 (two) times daily with a meal.   rivaroxaban 20 MG Tabs tablet Commonly known as:  XARELTO Take 1 tablet (20 mg total) by mouth daily with supper.   SYMBICORT 160-4.5 MCG/ACT inhaler Generic drug:  budesonide-formoterol Inhale 1 puff into the lungs 2 (two) times daily.            Durable Medical Equipment        Start     Ordered   01/21/16 1527  For home use only DME Walker rolling  Once    Question:  Patient  needs a walker to treat with the following condition  Answer:  Weakness   01/21/16 1526      Allergies  Allergen Reactions  . Ibuprofen Nausea And Vomiting  . Penicillins Swelling    Has patient had a PCN reaction causing immediate rash, facial/tongue/throat swelling, SOB or lightheadedness with hypotension: Yes Has patient had a PCN reaction causing severe rash involving mucus membranes or skin necrosis: No Has patient had a PCN reaction that required hospitalization No Has patient had a PCN reaction occurring within the last 10 years: No If all of the above answers are "NO", then may proceed with Cephalosporin use.   . Tiotropium Bromide Monohydrate Itching  .  Tramadol Nausea And Vomiting    Consultations:  None.    Procedures/Studies: Dg Chest 2 View  Result Date: 01/20/2016 CLINICAL DATA:  Productive cough and shortness of breath for 2 weeks. EXAM: CHEST  2 VIEW COMPARISON:  12/24/2015 chest radiographs and CTA FINDINGS: Right jugular Port-A-Cath remains in place with tip terminating over the lower SVC. The cardiomediastinal silhouette is within normal limits. Aortic atherosclerosis is noted. The lungs remain hyperinflated with a similar appearance of biapical scarring including calcification in the right apex. There is no evidence of acute airspace consolidation, edema, pleural effusion, or pneumothorax. Prior cervical spine fusion. IMPRESSION: 1. No active cardiopulmonary disease. 2. Aortic atherosclerosis. Electronically Signed   By: Logan Bores M.D.   On: 01/20/2016 21:33   Dg Chest 2 View  Result Date: 12/24/2015 CLINICAL DATA:  65 year old female with shortness of breath and productive cough for the past few days. First chemotherapy last Wednesday for pancreatic cancer. Hypertension. Asthma. Former smoker. Initial encounter. EXAM: CHEST  2 VIEW COMPARISON:  11/29/2015 and 07/17/2015 chest x-ray. 12/01/2015 chest CT. FINDINGS: Scarring lung apices similar to prior exams. No  segmental consolidation or pulmonary edema. CT detected mediastinal adenopathy not appreciated on present plain film exam. Heart size within normal limits. Calcified aorta. MediPort catheter tip mid to distal superior vena cava. No pneumothorax. Postsurgical changes lower cervical spine. IMPRESSION: Scarring lung apices stable. No segmental consolidation or pulmonary edema. Aortic atherosclerosis. Electronically Signed   By: Genia Del M.D.   On: 12/24/2015 17:05   Ct Angio Chest Pe W And/or Wo Contrast  Result Date: 12/24/2015 CLINICAL DATA:  Acute onset of cough and dyspnea on exertion. Recently diagnosed with pancreatic cancer, status post first round of chemotherapy. Initial encounter. EXAM: CT ANGIOGRAPHY CHEST WITH CONTRAST TECHNIQUE: Multidetector CT imaging of the chest was performed using the standard protocol during bolus administration of intravenous contrast. Multiplanar CT image reconstructions and MIPs were obtained to evaluate the vascular anatomy. CONTRAST:  100 mL of Isovue 370 IV contrast COMPARISON:  Chest radiograph performed earlier today at 4:46 p.m., and CT of the chest performed 12/01/2015 FINDINGS: Cardiovascular:  There is no evidence of pulmonary embolus. Mild left atrial enlargement is noted. Scattered calcification noted along the aortic arch and descending thoracic aorta. The great vessels are grossly unremarkable in appearance. Mediastinum/Nodes: Visualized mediastinal nodes remain normal in size. No pericardial effusion is seen. The thyroid gland is unremarkable. No axillary lymphadenopathy is appreciated. Lungs/Pleura: Mild scattered bilateral peribronchovascular opacity, more nodular at the right middle lobe, likely reflects an atypical infectious process. Underlying bilateral emphysema is noted. No dominant mass is seen. No pleural effusion or pneumothorax is identified. Upper Abdomen: A 6.5 cm left hepatic cyst is noted. The liver is otherwise unremarkable. The spleen is  within normal limits. The visualized portions of the adrenal glands and left kidney are within normal limits. A 3 mm stone is noted at the interpole region of the right kidney, and there is mild developmental irregularity of the right kidney. Scattered calcification is noted along the proximal abdominal aorta. The vaguely hypoattenuating mass at the distal body and tail of pancreas is significantly less prominent than on prior studies, though this may reflect the phase of contrast enhancement. Musculoskeletal: No acute osseous abnormalities are identified. Cervical spinal fusion hardware is partially imaged. The visualized musculature is unremarkable in appearance. Review of the MIP images confirms the above findings. IMPRESSION: 1. No evidence of pulmonary embolus. 2. Mild scattered bilateral peribronchovascular opacity within the lungs, more  nodular at the right middle lobe, likely reflects an atypical infectious process. 3. Underlying bilateral emphysema noted. 4. Mild left atrial enlargement. 5. Large hepatic cyst seen. 6. 3 mm nonobstructing stone at the interpole region of the right kidney. 7. Scattered aortic atherosclerosis. 8. Known pancreatic mass is less well characterized than on prior studies, possibly reflecting the phase of contrast enhancement. Electronically Signed   By: Garald Balding M.D.   On: 12/24/2015 20:33     Subjective:  Feeling better.    Discharge Exam: Vitals:   01/22/16 2142 01/23/16 0600  BP: 126/63 137/66  Pulse: 96 79  Resp: 19 18  Temp: 97.2 F (36.2 C) 98.2 F (36.8 C)   Vitals:   01/22/16 1939 01/22/16 2142 01/23/16 0600 01/23/16 0719  BP:  126/63 137/66   Pulse:  96 79   Resp:  19 18   Temp:  97.2 F (36.2 C) 98.2 F (36.8 C)   TempSrc:  Oral Oral   SpO2: 98% 98% 97% 99%  Weight:      Height:        General: Pt is alert, awake, not in acute distress Cardiovascular: RRR, S1/S2 +, no rubs, no gallops Respiratory: CTA bilaterally, no wheezing, no  rhonchi Abdominal: Soft, NT, ND, bowel sounds + Extremities: no edema, no cyanosis    The results of significant diagnostics from this hospitalization (including imaging, microbiology, ancillary and laboratory) are listed below for reference.     Recent Labs  06/27/15 1537 07/17/15 0358  BNP 880.0* [id number]*   Basic Metabolic Panel:  Recent Labs Lab 01/20/16 2127 01/21/16 0506  NA 137 133*  K 4.9 4.3  CL 108 104  CO2 20* 20*  GLUCOSE 97 184*  BUN 23* 30*  CREATININE 1.27* 1.27*  CALCIUM 8.9 8.6*   Liver Function Tests:  Recent Labs Lab 01/21/16 0506  AST 27  ALT 26  ALKPHOS 84  BILITOT 0.2*  PROT 6.7  ALBUMIN 3.2*   CBC:  Recent Labs Lab 01/20/16 2127 01/21/16 0506  WBC 11.0* 8.9  NEUTROABS 4.6  --   HGB 9.8* 9.3*  HCT 31.7* 29.6*  MCV 84.5 83.9  PLT 356 358   Cardiac Enzymes:  Recent Labs Lab 01/20/16 2127  TROPONINI <0.03   Urinalysis    Component Value Date/Time   COLORURINE YELLOW 12/24/2015 1612   APPEARANCEUR CLEAR 12/24/2015 1612   LABSPEC 1.025 12/24/2015 1612   PHURINE 6.0 12/24/2015 1612   GLUCOSEU NEGATIVE 12/24/2015 1612   HGBUR MODERATE (A) 12/24/2015 1612   BILIRUBINUR NEGATIVE 12/24/2015 1612   KETONESUR NEGATIVE 12/24/2015 1612   PROTEINUR NEGATIVE 12/24/2015 1612   NITRITE NEGATIVE 12/24/2015 1612   LEUKOCYTESUR SMALL (A) 12/24/2015 1612     Time coordinating discharge: Over 30 minutes  SIGNED:  Orvan Falconer, MD FACP [facility] 01/23/2016, 12:49 PM   If 7PM-7AM, please contact night-coverage [email or web address] Password [email or web address]

## 2016-01-23 NOTE — Care Management Note (Signed)
Case Management Note  Patient Details  Name: Lindsay Salazar MRN: [id number] Date of Birth: Apr 30, 1950  Expected Discharge Date:  01/22/16               Expected Discharge Plan:  Home/Self Care  In-House Referral:  NA  Discharge planning Services  CM Consult  Post Acute Care Choice:  NA Choice offered to:  NA  Status of Service:  Completed, signed off   Additional Comments: Pt discharging home today with self care. Pt will have port oxygen for transport home. Pt's bf at the bedside to provide transportation home. Pt's RW has been delivered to her room. Pt requesting info on new PCP's. List provided. Pt aware she must contact offices herself to make arrangements to establish care.  Sherald Barge, RN 01/23/2016, 1:55 PM

## 2016-01-23 NOTE — Progress Notes (Signed)
IV removed, WNL. D/C instructions given to patient. Verbalized understanding. Pt awaiting ride home.

## 2016-01-23 NOTE — Care Management Important Message (Signed)
Important Message  Patient Details  Name: KENDYLE PERIN MRN: [id number] Date of Birth: 1950-07-30   Medicare Important Message Given:  Yes    Sherald Barge, RN 01/23/2016, 1:53 PM

## 2016-01-24 ENCOUNTER — Other Ambulatory Visit (HOSPITAL_COMMUNITY): Payer: Self-pay | Admitting: Oncology

## 2016-01-24 ENCOUNTER — Encounter (HOSPITAL_BASED_OUTPATIENT_CLINIC_OR_DEPARTMENT_OTHER): Payer: Medicare HMO

## 2016-01-24 VITALS — BP 132/76 | HR 80 | Temp 98.1°F | Resp 18 | Wt 132.0 lb

## 2016-01-24 DIAGNOSIS — D509 Iron deficiency anemia, unspecified: Secondary | ICD-10-CM

## 2016-01-24 DIAGNOSIS — Z5111 Encounter for antineoplastic chemotherapy: Secondary | ICD-10-CM | POA: Diagnosis not present

## 2016-01-24 DIAGNOSIS — C251 Malignant neoplasm of body of pancreas: Secondary | ICD-10-CM | POA: Diagnosis not present

## 2016-01-24 DIAGNOSIS — E538 Deficiency of other specified B group vitamins: Secondary | ICD-10-CM

## 2016-01-24 LAB — COMPREHENSIVE METABOLIC PANEL
ALBUMIN: 3.3 g/dL — AB (ref 3.5–5.0)
ALK PHOS: 69 U/L (ref 38–126)
ALT: 28 U/L (ref 14–54)
ANION GAP: 8 (ref 5–15)
AST: 28 U/L (ref 15–41)
BILIRUBIN TOTAL: 0.4 mg/dL (ref 0.3–1.2)
BUN: 24 mg/dL — AB (ref 6–20)
CALCIUM: 8.8 mg/dL — AB (ref 8.9–10.3)
CO2: 24 mmol/L (ref 22–32)
Chloride: 106 mmol/L (ref 101–111)
Creatinine, Ser: 0.96 mg/dL (ref 0.44–1.00)
GFR calc Af Amer: >60 mL/min (ref >60)
GFR calc non Af Amer: 60 mL/min — ABNORMAL LOW (ref >60)
GLUCOSE: 127 mg/dL — AB (ref 65–99)
POTASSIUM: 3.9 mmol/L (ref 3.5–5.1)
SODIUM: 138 mmol/L (ref 135–145)
Total Protein: 6.3 g/dL — ABNORMAL LOW (ref 6.5–8.1)

## 2016-01-24 LAB — CBC WITH DIFFERENTIAL/PLATELET
BASOS ABS: 0 10*3/uL (ref 0.0–0.1)
Basophils Relative: 0 %
EOS PCT: 0 %
Eosinophils Absolute: 0 10*3/uL (ref 0.0–0.7)
HEMATOCRIT: 27 % — AB (ref 36.0–46.0)
HEMOGLOBIN: 8.5 g/dL — AB (ref 12.0–15.0)
LYMPHS ABS: 0.7 10*3/uL (ref 0.7–4.0)
LYMPHS PCT: 5 %
MCH: 26.8 pg (ref 26.0–34.0)
MCHC: 31.5 g/dL (ref 30.0–36.0)
MCV: 85.2 fL (ref 78.0–100.0)
MONOS PCT: 9 %
Monocytes Absolute: 1.2 10*3/uL — ABNORMAL HIGH (ref 0.1–1.0)
NEUTROS PCT: 86 %
Neutro Abs: 11.2 10*3/uL — ABNORMAL HIGH (ref 1.7–7.7)
Platelets: 454 10*3/uL — ABNORMAL HIGH (ref 150–400)
RBC: 3.17 MIL/uL — AB (ref 3.87–5.11)
RDW: 22.3 % — ABNORMAL HIGH (ref 11.5–15.5)
WBC: 13.1 10*3/uL — AB (ref 4.0–10.5)

## 2016-01-24 LAB — FOLATE: Folate: 7 ng/mL (ref >5.9)

## 2016-01-24 LAB — IRON AND TIBC
IRON: 21 ug/dL — AB (ref 28–170)
Saturation Ratios: 4 % — ABNORMAL LOW (ref 10.4–31.8)
TIBC: 469 ug/dL — AB (ref 250–450)
UIBC: 448 ug/dL

## 2016-01-24 LAB — VITAMIN B12: Vitamin B-12: 227 pg/mL (ref 180–914)

## 2016-01-24 LAB — FERRITIN: FERRITIN: 32 ng/mL (ref 11–307)

## 2016-01-24 MED ORDER — HYDROCODONE-ACETAMINOPHEN 5-325 MG PO TABS
1.0000 | ORAL_TABLET | Freq: Once | ORAL | Status: AC
  Administered 2016-01-24: 1 via ORAL

## 2016-01-24 MED ORDER — SODIUM CHLORIDE 0.9% FLUSH: 10.0000 mL | INTRAVENOUS | Status: DC | PRN

## 2016-01-24 MED ORDER — HYDROCODONE-ACETAMINOPHEN 5-325 MG PO TABS
ORAL_TABLET | ORAL | Status: AC
  Filled 2016-01-24: qty 1

## 2016-01-24 MED ORDER — PACLITAXEL PROTEIN-BOUND CHEMO INJECTION 100 MG
125.0000 mg/m2 | Freq: Once | INTRAVENOUS | Status: AC
  Administered 2016-01-24: 200 mg via INTRAVENOUS
  Filled 2016-01-24: qty 20

## 2016-01-24 MED ORDER — HEPARIN SOD (PORK) LOCK FLUSH 100 UNIT/ML IV SOLN
500.0000 [IU] | Freq: Once | INTRAVENOUS | Status: AC | PRN
  Administered 2016-01-24: 500 [IU]
  Filled 2016-01-24: qty 5

## 2016-01-24 MED ORDER — PROCHLORPERAZINE MALEATE 10 MG PO TABS
10.0000 mg | ORAL_TABLET | Freq: Once | ORAL | Status: AC
  Administered 2016-01-24: 10 mg via ORAL
  Filled 2016-01-24: qty 1

## 2016-01-24 MED ORDER — SODIUM CHLORIDE 0.9 % IV SOLN
Freq: Once | INTRAVENOUS | Status: AC
  Administered 2016-01-24: 11:00:00 via INTRAVENOUS

## 2016-01-24 MED ORDER — GEMCITABINE HCL CHEMO INJECTION 1 GM/26.3ML
975.0000 mg/m2 | Freq: Once | INTRAVENOUS | Status: AC
  Administered 2016-01-24: 1596 mg via INTRAVENOUS
  Filled 2016-01-24: qty 26.2

## 2016-01-24 NOTE — Progress Notes (Signed)
Patient was given stool cards and educated on how to use them and to bring them back at her next appointment.  She verbalized understanding.  She reports no s/s bleeding other than nosebleeds, but due to her Hgb trending down, we are going to check stool for occult blood.  In addition, patient to return to the hospital next Wednesday for labs to further monitor her Hgb level.  Patient tolerated infusion well.  VSS.  Patient ambulatory and stable upon discharge.

## 2016-01-25 LAB — CANCER ANTIGEN 19-9: CA 19 9: 52 U/mL — AB (ref 0–35)

## 2016-01-28 ENCOUNTER — Other Ambulatory Visit: Payer: Self-pay

## 2016-01-28 ENCOUNTER — Emergency Department (HOSPITAL_COMMUNITY): Payer: Medicare HMO

## 2016-01-28 ENCOUNTER — Inpatient Hospital Stay (HOSPITAL_COMMUNITY)
Admission: EM | Admit: 2016-01-28 | Discharge: 2016-02-05 | DRG: 308 | Disposition: A | Discharge status: Home / Self Care | Payer: Medicare HMO | Attending: Internal Medicine | Admitting: Internal Medicine

## 2016-01-28 ENCOUNTER — Encounter (HOSPITAL_COMMUNITY): Payer: Self-pay

## 2016-01-28 DIAGNOSIS — N183 Chronic kidney disease, stage 3 (moderate): Secondary | ICD-10-CM | POA: Diagnosis present

## 2016-01-28 DIAGNOSIS — I4892 Unspecified atrial flutter: Secondary | ICD-10-CM | POA: Diagnosis present

## 2016-01-28 DIAGNOSIS — M1990 Unspecified osteoarthritis, unspecified site: Secondary | ICD-10-CM | POA: Diagnosis present

## 2016-01-28 DIAGNOSIS — F17210 Nicotine dependence, cigarettes, uncomplicated: Secondary | ICD-10-CM | POA: Diagnosis present

## 2016-01-28 DIAGNOSIS — Z888 Allergy status to other drugs, medicaments and biological substances status: Secondary | ICD-10-CM

## 2016-01-28 DIAGNOSIS — T451X5A Adverse effect of antineoplastic and immunosuppressive drugs, initial encounter: Secondary | ICD-10-CM | POA: Diagnosis present

## 2016-01-28 DIAGNOSIS — Z6822 Body mass index (BMI) 22.0-22.9, adult: Secondary | ICD-10-CM

## 2016-01-28 DIAGNOSIS — K219 Gastro-esophageal reflux disease without esophagitis: Secondary | ICD-10-CM | POA: Diagnosis present

## 2016-01-28 DIAGNOSIS — D611 Drug-induced aplastic anemia: Secondary | ICD-10-CM | POA: Diagnosis present

## 2016-01-28 DIAGNOSIS — G8929 Other chronic pain: Secondary | ICD-10-CM | POA: Diagnosis present

## 2016-01-28 DIAGNOSIS — I509 Heart failure, unspecified: Secondary | ICD-10-CM | POA: Diagnosis present

## 2016-01-28 DIAGNOSIS — Z7952 Long term (current) use of systemic steroids: Secondary | ICD-10-CM

## 2016-01-28 DIAGNOSIS — I480 Paroxysmal atrial fibrillation: Principal | ICD-10-CM | POA: Diagnosis present

## 2016-01-28 DIAGNOSIS — J441 Chronic obstructive pulmonary disease with (acute) exacerbation: Secondary | ICD-10-CM | POA: Diagnosis present

## 2016-01-28 DIAGNOSIS — R2689 Other abnormalities of gait and mobility: Secondary | ICD-10-CM

## 2016-01-28 DIAGNOSIS — Z79899 Other long term (current) drug therapy: Secondary | ICD-10-CM

## 2016-01-28 DIAGNOSIS — I130 Hypertensive heart and chronic kidney disease with heart failure and stage 1 through stage 4 chronic kidney disease, or unspecified chronic kidney disease: Secondary | ICD-10-CM | POA: Diagnosis present

## 2016-01-28 DIAGNOSIS — F419 Anxiety disorder, unspecified: Secondary | ICD-10-CM | POA: Diagnosis present

## 2016-01-28 DIAGNOSIS — Z9981 Dependence on supplemental oxygen: Secondary | ICD-10-CM | POA: Diagnosis not present

## 2016-01-28 DIAGNOSIS — M25551 Pain in right hip: Secondary | ICD-10-CM | POA: Diagnosis present

## 2016-01-28 DIAGNOSIS — F329 Major depressive disorder, single episode, unspecified: Secondary | ICD-10-CM | POA: Diagnosis present

## 2016-01-28 DIAGNOSIS — Z7901 Long term (current) use of anticoagulants: Secondary | ICD-10-CM

## 2016-01-28 DIAGNOSIS — Z981 Arthrodesis status: Secondary | ICD-10-CM

## 2016-01-28 DIAGNOSIS — J449 Chronic obstructive pulmonary disease, unspecified: Secondary | ICD-10-CM | POA: Diagnosis not present

## 2016-01-28 DIAGNOSIS — M797 Fibromyalgia: Secondary | ICD-10-CM | POA: Diagnosis present

## 2016-01-28 DIAGNOSIS — C259 Malignant neoplasm of pancreas, unspecified: Secondary | ICD-10-CM | POA: Diagnosis present

## 2016-01-28 DIAGNOSIS — I129 Hypertensive chronic kidney disease with stage 1 through stage 4 chronic kidney disease, or unspecified chronic kidney disease: Secondary | ICD-10-CM | POA: Diagnosis present

## 2016-01-28 DIAGNOSIS — I10 Essential (primary) hypertension: Secondary | ICD-10-CM | POA: Diagnosis not present

## 2016-01-28 DIAGNOSIS — D638 Anemia in other chronic diseases classified elsewhere: Secondary | ICD-10-CM | POA: Diagnosis present

## 2016-01-28 DIAGNOSIS — C257 Malignant neoplasm of other parts of pancreas: Secondary | ICD-10-CM | POA: Diagnosis not present

## 2016-01-28 DIAGNOSIS — Z886 Allergy status to analgesic agent status: Secondary | ICD-10-CM

## 2016-01-28 DIAGNOSIS — Z201 Contact with and (suspected) exposure to tuberculosis: Secondary | ICD-10-CM | POA: Diagnosis present

## 2016-01-28 DIAGNOSIS — J9611 Chronic respiratory failure with hypoxia: Secondary | ICD-10-CM | POA: Diagnosis not present

## 2016-01-28 DIAGNOSIS — D649 Anemia, unspecified: Secondary | ICD-10-CM | POA: Diagnosis not present

## 2016-01-28 DIAGNOSIS — E440 Moderate protein-calorie malnutrition: Secondary | ICD-10-CM | POA: Diagnosis present

## 2016-01-28 DIAGNOSIS — I4891 Unspecified atrial fibrillation: Secondary | ICD-10-CM

## 2016-01-28 DIAGNOSIS — Z79891 Long term (current) use of opiate analgesic: Secondary | ICD-10-CM

## 2016-01-28 DIAGNOSIS — Z880 Allergy status to penicillin: Secondary | ICD-10-CM

## 2016-01-28 DIAGNOSIS — Z7951 Long term (current) use of inhaled steroids: Secondary | ICD-10-CM

## 2016-01-28 DIAGNOSIS — J9621 Acute and chronic respiratory failure with hypoxia: Secondary | ICD-10-CM | POA: Diagnosis present

## 2016-01-28 DIAGNOSIS — Z87891 Personal history of nicotine dependence: Secondary | ICD-10-CM

## 2016-01-28 LAB — COMPREHENSIVE METABOLIC PANEL
ALBUMIN: 3.4 g/dL — AB (ref 3.5–5.0)
ALT: 33 U/L (ref 14–54)
AST: 30 U/L (ref 15–41)
Alkaline Phosphatase: 75 U/L (ref 38–126)
Anion gap: 7 (ref 5–15)
BUN: 30 mg/dL — AB (ref 6–20)
CHLORIDE: 105 mmol/L (ref 101–111)
CO2: 25 mmol/L (ref 22–32)
CREATININE: 0.82 mg/dL (ref 0.44–1.00)
Calcium: 8.6 mg/dL — ABNORMAL LOW (ref 8.9–10.3)
GFR calc non Af Amer: >60 mL/min (ref >60)
Glucose, Bld: 116 mg/dL — ABNORMAL HIGH (ref 65–99)
Potassium: 4.9 mmol/L (ref 3.5–5.1)
SODIUM: 137 mmol/L (ref 135–145)
Total Bilirubin: 0.9 mg/dL (ref 0.3–1.2)
Total Protein: 6.4 g/dL — ABNORMAL LOW (ref 6.5–8.1)

## 2016-01-28 LAB — BRAIN NATRIURETIC PEPTIDE: B Natriuretic Peptide: 113 pg/mL — ABNORMAL HIGH (ref 0.0–100.0)

## 2016-01-28 LAB — CBC WITH DIFFERENTIAL/PLATELET
BASOS ABS: 0 10*3/uL (ref 0.0–0.1)
Basophils Relative: 0 %
Eosinophils Absolute: 0.2 10*3/uL (ref 0.0–0.7)
Eosinophils Relative: 2 %
HEMATOCRIT: 31.4 % — AB (ref 36.0–46.0)
Hemoglobin: 10 g/dL — ABNORMAL LOW (ref 12.0–15.0)
LYMPHS PCT: 17 %
Lymphs Abs: 1.7 10*3/uL (ref 0.7–4.0)
MCH: 26.8 pg (ref 26.0–34.0)
MCHC: 31.8 g/dL (ref 30.0–36.0)
MCV: 84.2 fL (ref 78.0–100.0)
MONO ABS: 0.2 10*3/uL (ref 0.1–1.0)
Monocytes Relative: 2 %
NEUTROS ABS: 7.7 10*3/uL (ref 1.7–7.7)
Neutrophils Relative %: 79 %
Platelets: 424 10*3/uL — ABNORMAL HIGH (ref 150–400)
RBC: 3.73 MIL/uL — ABNORMAL LOW (ref 3.87–5.11)
RDW: 21.3 % — AB (ref 11.5–15.5)
WBC: 9.7 10*3/uL (ref 4.0–10.5)

## 2016-01-28 LAB — TROPONIN I: Troponin I: <0.03 ng/mL (ref <0.03)

## 2016-01-28 LAB — MRSA PCR SCREENING: MRSA by PCR: INVALID — AB

## 2016-01-28 LAB — MAGNESIUM: MAGNESIUM: 2.9 mg/dL — AB (ref 1.7–2.4)

## 2016-01-28 LAB — PHOSPHORUS: PHOSPHORUS: 3.8 mg/dL (ref 2.5–4.6)

## 2016-01-28 MED ORDER — METOPROLOL TARTRATE 25 MG PO TABS
37.5000 mg | ORAL_TABLET | Freq: Two times a day (BID) | ORAL | Status: DC
  Administered 2016-01-28 (×2): 37.5 mg via ORAL
  Filled 2016-01-28 (×2): qty 2

## 2016-01-28 MED ORDER — METHYLPREDNISOLONE SODIUM SUCC 125 MG IJ SOLR
80.0000 mg | Freq: Four times a day (QID) | INTRAMUSCULAR | Status: AC
  Administered 2016-01-28 – 2016-01-29 (×4): 80 mg via INTRAVENOUS
  Filled 2016-01-28 (×4): qty 2

## 2016-01-28 MED ORDER — POTASSIUM CHLORIDE CRYS ER 20 MEQ PO TBCR: 40.0000 meq | EXTENDED_RELEASE_TABLET | Freq: Two times a day (BID) | ORAL | Status: DC

## 2016-01-28 MED ORDER — GUAIFENESIN 100 MG/5ML PO SOLN
10.0000 mL | ORAL | Status: DC | PRN
  Administered 2016-01-28 – 2016-02-02 (×13): 200 mg via ORAL
  Filled 2016-01-28: qty 5
  Filled 2016-01-28 (×2): qty 10
  Filled 2016-01-28: qty 5
  Filled 2016-01-28: qty 10
  Filled 2016-01-28: qty 5
  Filled 2016-01-28: qty 25
  Filled 2016-01-28 (×2): qty 10
  Filled 2016-01-28 (×4): qty 5
  Filled 2016-01-28: qty 10
  Filled 2016-01-28: qty 5
  Filled 2016-01-28: qty 10

## 2016-01-28 MED ORDER — DILTIAZEM HCL 100 MG IV SOLR
5.0000 mg/h | INTRAVENOUS | Status: DC
  Administered 2016-01-28: 5 mg/h via INTRAVENOUS

## 2016-01-28 MED ORDER — DILTIAZEM LOAD VIA INFUSION
10.0000 mg | Freq: Once | INTRAVENOUS | Status: AC
  Administered 2016-01-28: 10 mg via INTRAVENOUS
  Filled 2016-01-28: qty 10

## 2016-01-28 MED ORDER — RIVAROXABAN 20 MG PO TABS
20.0000 mg | ORAL_TABLET | Freq: Every day | ORAL | Status: DC
  Administered 2016-01-28 – 2016-02-05 (×9): 20 mg via ORAL
  Filled 2016-01-28 (×9): qty 1

## 2016-01-28 MED ORDER — NICOTINE 14 MG/24HR TD PT24
14.0000 mg | MEDICATED_PATCH | Freq: Every day | TRANSDERMAL | Status: DC
  Administered 2016-01-28 – 2016-02-05 (×9): 14 mg via TRANSDERMAL
  Filled 2016-01-28 (×9): qty 1

## 2016-01-28 MED ORDER — HYDROCODONE-ACETAMINOPHEN 5-325 MG PO TABS
1.0000 | ORAL_TABLET | Freq: Once | ORAL | Status: AC
  Administered 2016-02-03: 1 via ORAL
  Filled 2016-01-28: qty 1

## 2016-01-28 MED ORDER — FUROSEMIDE 20 MG PO TABS: 20.0000 mg | ORAL_TABLET | Freq: Every day | ORAL | Status: DC | PRN

## 2016-01-28 MED ORDER — LEVALBUTEROL HCL 1.25 MG/0.5ML IN NEBU
1.2500 mg | INHALATION_SOLUTION | Freq: Once | RESPIRATORY_TRACT | Status: AC
  Administered 2016-01-28: 1.25 mg via RESPIRATORY_TRACT
  Filled 2016-01-28: qty 0.5

## 2016-01-28 MED ORDER — DEXTROSE 5 % IV SOLN
INTRAVENOUS | Status: AC
  Administered 2016-01-28: 5 mg/h via INTRAVENOUS
  Filled 2016-01-28: qty 100

## 2016-01-28 MED ORDER — ESCITALOPRAM OXALATE 10 MG PO TABS
10.0000 mg | ORAL_TABLET | Freq: Every evening | ORAL | Status: DC
  Administered 2016-01-28 – 2016-02-05 (×9): 10 mg via ORAL
  Filled 2016-01-28 (×9): qty 1

## 2016-01-28 MED ORDER — LEVALBUTEROL HCL 1.25 MG/0.5ML IN NEBU
1.2500 mg | INHALATION_SOLUTION | Freq: Four times a day (QID) | RESPIRATORY_TRACT | Status: DC
  Administered 2016-01-28 – 2016-02-05 (×33): 1.25 mg via RESPIRATORY_TRACT
  Filled 2016-01-28 (×31): qty 0.5

## 2016-01-28 MED ORDER — IPRATROPIUM BROMIDE 0.02 % IN SOLN
0.5000 mg | Freq: Four times a day (QID) | RESPIRATORY_TRACT | Status: DC
  Administered 2016-01-28 – 2016-02-05 (×34): 0.5 mg via RESPIRATORY_TRACT
  Filled 2016-01-28 (×34): qty 2.5

## 2016-01-28 MED ORDER — PREDNISONE 20 MG PO TABS
40.0000 mg | ORAL_TABLET | Freq: Every day | ORAL | Status: AC
  Administered 2016-01-29 – 2016-02-01 (×4): 40 mg via ORAL
  Filled 2016-01-28 (×4): qty 2

## 2016-01-28 MED ORDER — HYDROCODONE-ACETAMINOPHEN 5-325 MG PO TABS
1.0000 | ORAL_TABLET | Freq: Four times a day (QID) | ORAL | Status: DC | PRN
  Administered 2016-01-28 – 2016-02-05 (×30): 1 via ORAL
  Filled 2016-01-28 (×30): qty 1

## 2016-01-28 MED ORDER — PANTOPRAZOLE SODIUM 40 MG PO TBEC
40.0000 mg | DELAYED_RELEASE_TABLET | Freq: Every day | ORAL | Status: DC
  Administered 2016-01-28 – 2016-02-05 (×9): 40 mg via ORAL
  Filled 2016-01-28 (×9): qty 1

## 2016-01-28 MED ORDER — MAGNESIUM SULFATE 2 GM/50ML IV SOLN
2.0000 g | Freq: Once | INTRAVENOUS | Status: AC
  Administered 2016-01-28: 2 g via INTRAVENOUS
  Filled 2016-01-28: qty 50

## 2016-01-28 MED ORDER — METHYLPREDNISOLONE SODIUM SUCC 125 MG IJ SOLR
125.0000 mg | Freq: Once | INTRAMUSCULAR | Status: AC
  Administered 2016-01-28: 125 mg via INTRAVENOUS
  Filled 2016-01-28: qty 2

## 2016-01-28 MED ORDER — DILTIAZEM HCL 60 MG PO TABS
60.0000 mg | ORAL_TABLET | Freq: Four times a day (QID) | ORAL | Status: DC
  Administered 2016-01-28 – 2016-01-31 (×11): 60 mg via ORAL
  Filled 2016-01-28 (×11): qty 1

## 2016-01-28 MED ORDER — LEVALBUTEROL HCL 1.25 MG/0.5ML IN NEBU
1.2500 mg | INHALATION_SOLUTION | Freq: Four times a day (QID) | RESPIRATORY_TRACT | Status: DC
  Administered 2016-01-28: 1.25 mg via RESPIRATORY_TRACT
  Filled 2016-01-28: qty 0.5

## 2016-01-28 NOTE — ED Notes (Signed)
Pt provided drink/snack at this time. 

## 2016-01-28 NOTE — ED Triage Notes (Signed)
Pt c/o onset of sob tonight.  Pt states she has run out of her inhalers.   Pt states she is usually sob but worse tonight.  Pt denies chest pain

## 2016-01-28 NOTE — ED Provider Notes (Signed)
[facility] DEPT Provider Note   CSN: [id number] Arrival date & time: 01/28/16  0048  Time seen 01:15 AM   History   Chief Complaint Chief Complaint  Patient presents with  . Shortness of Breath    HPI Lindsay Salazar is a 65 y.o. female.  HPI patient reports the past 2-3 days she's been having shortness of breath and cough producing gray and sometimes dark sputum. She states that is been getting worse over the past couple days. Tonight she started having chest pain indicates her left lateral chest. She reports she has been wheezing and having shortness of breath. She did her nebulizer at 4 PM this afternoon it didn't help. She reports her shortness of breath got a lot worse about 9 PM tonight. She states if she exerts herself or lays flat shortness of worse.Patient states she has felt this way before when she had pneumonia. She states she did get her flu shot this year. Patient denies having palpitations but states she had irregular heart rate in May and ended up having cardioversion. She denies any swelling of her extremities. Patient states she has oxygen to use at home and only uses it a couple hours a day. She uses 2 L/m nasal cannula. She states she has not been using it more since she has started getting short of breath.  PCP Dr Sharee Pimple  Past Medical History:  Diagnosis Date  . Anginal pain ([place])    thought she was having   heartburn  . Arthritis    bursitis , fibromyalgia  . Arthritis    "right hip" (11/27/2015)  . Asthma   . Atrial fibrillation ([place])   . Chronic bronchitis ([place])   . Chronic right hip pain   . COPD (chronic obstructive pulmonary disease) ([place])    wears home o2 prn  . Daily headache    "last 2-3 months" (11/27/2015)  . Exposure to TB    "mom had it when she was pregnant with me"  . Fibromyalgia   . GERD (gastroesophageal reflux disease)    use to have it but not now  . Heart murmur    1-  70 years old  . Hypertension   . On home oxygen therapy    11/27/2015 "whenever I need it; don't know how many liters"  . Pancreatic cancer ([place])   . Pneumonia    "several times" (11/27/2015)  . Pneumothorax     Patient Active Problem List   Diagnosis Date Noted  . Iron deficiency anemia 01/24/2016  . B12 deficiency 01/24/2016  . COPD with acute exacerbation ([place]) 01/20/2016  . COPD exacerbation ([place]) 01/20/2016  . Pancreatic cancer ([place]) 12/07/2015  . Epigastric pain   . Abdominal pain 11/27/2015  . Pancreatic mass 11/27/2015  . Hypokalemia 11/27/2015  . Leukocytosis 11/27/2015  . Chronic anticoagulation-Xarelto 09/06/2015  . Gastrointestinal hemorrhage with melena 08/08/2015  . Acute blood loss anemia 08/08/2015  . Chronic obstructive pulmonary disease (COPD) ([place]) 08/08/2015  . Chest pain 08/08/2015  . Dyspnea   . Elevated troponin I level 07/17/2015  . Hyperglycemia 07/17/2015  . Anemia 07/17/2015  . Atrial fibrillation ([place]) 07/17/2015  . Atrial flutter ([place]) 06/27/2015  . Atrial fibrillation with RVR ([place]) 06/27/2015  . COPD with exacerbation ([place]) 06/27/2015  . Hypertensive urgency 06/27/2015  . Tobacco abuse 06/27/2015  . Elevated troponin 06/27/2015  . CAP (community acquired pneumonia)   . OSTEOPOROSIS 04/10/2009  . WEIGHT LOSS 04/10/2009    Past Surgical History:  Procedure Laterality  Date  . ANTERIOR CERVICAL DECOMP/DISCECTOMY FUSION     "put 4 screws in"  . BACK SURGERY    . CARDIAC CATHETERIZATION  08/2004   Archie Endo 06/18/2010  . CARDIOVERSION N/A 07/18/2015   Procedure: CARDIOVERSION;  Surgeon: Josue Hector, MD;  Location: [facility];  Service: Cardiovascular;  Laterality: N/A;  . ESOPHAGOGASTRODUODENOSCOPY N/A 08/09/2015   Procedure: ESOPHAGOGASTRODUODENOSCOPY (EGD);  Surgeon: Rogene Houston, MD;  Location: [facility] END[facility] SUITE;  Service: Endoscopy;  Laterality: N/A;  . EUS N/A 11/29/2015   Procedure: UPPER ENDOSCOPIC ULTRASOUND (EUS) RADIAL;  Surgeon: Milus Banister, MD;  Location: [facility] ENDOS[facility];  Service:  Endoscopy;  Laterality: N/A;  . IR GENERIC HISTORICAL  12/18/2015   IR US GUIDE VASC ACCESS RIGHT 12/18/2015 Sandi Mariscal, MD [facility]  . IR GENERIC HISTORICAL  12/18/2015   IR FLUORO GUIDE PORT INSERTION RIGHT 12/18/2015 Sandi Mariscal, MD [facility]  . TONSILLECTOMY    . TUBAL LIGATION      OB History    Gravida Para Term Preterm AB Living   2         2   SAB TAB Ectopic Multiple Live Births                   Home Medications    Prior to Admission medications   Medication Sig Start Date End Date Taking? Authorizing Provider  albuterol (PROVENTIL) (2.5 MG/3ML) 0.083% nebulizer solution Take 3 mLs (2.5 mg total) by nebulization every 6 (six) hours as needed for wheezing or shortness of breath. 12/03/15   Ripudeep Krystal Eaton, MD  COMBIVENT RESPIMAT 20-100 MCG/ACT AERS respimat Inhale 1 puff into the lungs 2 (two) times daily. 12/03/15   Ripudeep Krystal Eaton, MD  diltiazem (CARDIZEM CD) 240 MG 24 hr capsule Take 1 capsule (240 mg total) by mouth daily. 12/03/15   Ripudeep Krystal Eaton, MD  escitalopram (LEXAPRO) 10 MG tablet Take 1 tablet (10 mg total) by mouth every evening. 12/03/15   Ripudeep Krystal Eaton, MD  furosemide (LASIX) 20 MG tablet Take 1 tablet (20 mg total) by mouth daily as needed for edema. Patient taking differently: Take 20 mg by mouth daily as needed for fluid or edema.  07/06/15   Arnoldo Lenis, MD  Gemcitabine HCl (GEMZAR IV) Inject into the vein. Day 1, day 8, day 15, every 28 days    Historical Provider, MD  guaiFENesin (ROBITUSSIN) 100 MG/5ML SOLN Take 10 mLs (200 mg total) by mouth every 4 (four) hours as needed for cough or to loosen phlegm. 01/23/16   Orvan Falconer, MD  HYDROcodone-acetaminophen (NORCO/VICODIN) 5-325 MG tablet Take 1 tablet by mouth every 6 (six) hours as needed for moderate pain (Must last 30 days.Do not take and drive a car or use machinery.). 01/10/16   Sanjuana Kava, MD  ipratropium (ATROVENT) 0.02 % nebulizer solution Take 2.5 mLs (0.5 mg total) by nebulization 3  (three) times daily. 01/23/16   Orvan Falconer, MD  ipratropium-albuterol (DUONEB) 0.5-2.5 (3) MG/3ML SOLN Inhale 3 mLs into the lungs every 6 (six) hours as needed (for shortness of breath). 12/03/15   Ripudeep Krystal Eaton, MD  metoprolol tartrate 37.5 MG TABS Take 37.5 mg by mouth 2 (two) times daily. 12/03/15   Ripudeep Krystal Eaton, MD  nicotine (NICODERM CQ - DOSED IN MG/24 HOURS) 14 mg/24hr patch Place 1 patch (14 mg total) onto the skin daily. 12/03/15   Ripudeep Krystal Eaton, MD  PACLitaxel Protein-Bound Part (ABRAXANE IV) Inject into the vein.  Day 1, day 8, day 15, every 28 days    Historical Provider, MD  pantoprazole (PROTONIX) 40 MG tablet Take 1 tablet (40 mg total) by mouth daily. 12/03/15   Ripudeep Krystal Eaton, MD  potassium chloride SA (K-DUR,KLOR-CON) 20 MEQ tablet Take 2 tablets (40 mEq total) by mouth 2 (two) times daily. 01/02/16 04/01/16  Baird Cancer, PA-C  predniSONE (DELTASONE) 20 MG tablet Take 1 tablet (20 mg total) by mouth 2 (two) times daily with a meal. 01/23/16 01/28/16  Orvan Falconer, MD  rivaroxaban (XARELTO) 20 MG TABS tablet Take 1 tablet (20 mg total) by mouth daily with supper. 07/19/15   Reyne Dumas, MD  SYMBICORT 160-4.5 MCG/ACT inhaler Inhale 1 puff into the lungs 2 (two) times daily. 12/03/15   Ripudeep Krystal Eaton, MD    Family History Family History  Problem Relation Age of Onset  . COPD Mother   . Diabetes Father   . Stroke Father     Social History Social History  Substance Use Topics  . Smoking status: Former Smoker    Packs/day: 0.50    Years: 46.00    Types: Cigarettes    Start date: 10/09/1969    Quit date: 11/29/2015  . Smokeless tobacco: Never Used  . Alcohol use No     Comment: quit 30 years ago  lives at home Lives with spouse Oxygen 2 lpm [place] PRN   Allergies   Ibuprofen; Penicillins; Tiotropium bromide monohydrate; and Tramadol   Review of Systems Review of Systems  All other systems reviewed and are negative.    Physical Exam Updated Vital Signs BP 109/73  (BP Location: Right Arm)   Pulse 72   Temp 98.2 F (36.8 C) (Oral)   Resp 26   Ht 5\' 4"  (1.626 m)   Wt 131 lb (59.4 kg)   SpO2 100%   BMI 22.49 kg/m   Vital signs normal except for tachypnea   Physical Exam  Constitutional: She is oriented to person, place, and time.  Non-toxic appearance. She does not appear ill. No distress.  Frail elderly female who appears much older than her stated age  HENT:  Head: Normocephalic and atraumatic.  Right Ear: External ear normal.  Left Ear: External ear normal.  Nose: Nose normal. No mucosal edema or rhinorrhea.  Mouth/Throat: Oropharynx is clear and moist and mucous membranes are normal. No dental abscesses or uvula swelling.  Eyes: Conjunctivae and EOM are normal. Pupils are equal, round, and reactive to light.  Neck: Normal range of motion and full passive range of motion without pain. Neck supple.  Cardiovascular: Normal heart sounds.  An irregularly irregular rhythm present. Tachycardia present.  Exam reveals no gallop and no friction rub.   No murmur heard. Pulmonary/Chest: Accessory muscle usage present. Tachypnea noted. No respiratory distress. She has decreased breath sounds. She has wheezes. She has no rhonchi. She has no rales. She exhibits no tenderness and no crepitus.  Despite seeming short of breath on talking patient is very talkative  Abdominal: Soft. Normal appearance and bowel sounds are normal. She exhibits no distension. There is no tenderness. There is no rebound and no guarding.  Musculoskeletal: Normal range of motion. She exhibits no edema or tenderness.  Moves all extremities well.   Neurological: She is alert and oriented to person, place, and time. She has normal strength. No cranial nerve deficit.  Skin: Skin is warm, dry and intact. No rash noted. No erythema. No pallor.  Psychiatric: She has a normal  mood and affect. Her speech is normal and behavior is normal. Her mood appears not anxious.  Nursing note and vitals  reviewed.    ED Treatments / Results  Labs (all labs ordered are listed, but only abnormal results are displayed) Results for orders placed or performed during the hospital encounter of 01/28/16  Comprehensive metabolic panel  Result Value Ref Range   Sodium 137 135 - 145 mmol/L   Potassium 4.9 3.5 - 5.1 mmol/L   Chloride 105 101 - 111 mmol/L   CO2 25 22 - 32 mmol/L   Glucose, Bld 116 (H) 65 - 99 mg/dL   BUN 30 (H) 6 - 20 mg/dL   Creatinine, Ser 0.82 0.44 - 1.00 mg/dL   Calcium 8.6 (L) 8.9 - 10.3 mg/dL   Total Protein 6.4 (L) 6.5 - 8.1 g/dL   Albumin 3.4 (L) 3.5 - 5.0 g/dL   AST 30 15 - 41 U/L   ALT 33 14 - 54 U/L   Alkaline Phosphatase 75 38 - 126 U/L   Total Bilirubin 0.9 0.3 - 1.2 mg/dL   GFR calc non Af Amer >60 >60 mL/min   GFR calc Af Amer >60 >60 mL/min   Anion gap 7 5 - 15  Brain natriuretic peptide  Result Value Ref Range   B Natriuretic Peptide 113.0 (H) 0.0 - 100.0 pg/mL  Troponin I  Result Value Ref Range   Troponin I <0.03 <0.03 ng/mL  CBC with Differential  Result Value Ref Range   WBC 9.7 4.0 - 10.5 K/uL   RBC 3.73 (L) 3.87 - 5.11 MIL/uL   Hemoglobin 10.0 (L) 12.0 - 15.0 g/dL   HCT 31.4 (L) 36.0 - 46.0 %   MCV 84.2 78.0 - 100.0 fL   MCH 26.8 26.0 - 34.0 pg   MCHC 31.8 30.0 - 36.0 g/dL   RDW 21.3 (H) 11.5 - 15.5 %   Platelets 424 (H) 150 - 400 K/uL   Neutrophils Relative % 79 %   Neutro Abs 7.7 1.7 - 7.7 K/uL   Lymphocytes Relative 17 %   Lymphs Abs 1.7 0.7 - 4.0 K/uL   Monocytes Relative 2 %   Monocytes Absolute 0.2 0.1 - 1.0 K/uL   Eosinophils Relative 2 %   Eosinophils Absolute 0.2 0.0 - 0.7 K/uL   Basophils Relative 0 %   Basophils Absolute 0.0 0.0 - 0.1 K/uL   Laboratory interpretation all normal except anemia, malnutrition    EKG  EKG Interpretation  Date/Time:  Monday January 28 2016 00:58:15 EST Ventricular Rate:  115 PR Interval:    QRS Duration: 79 QT Interval:  325 QTC Calculation: 430 R Axis:   69 Text Interpretation:   Atrial fibrillation Ventricular premature complex Anteroseptal infarct, old Repol abnrm suggests ischemia, diffuse leads Since last tracing rate faster 20 Jan 2016 Confirmed by Laymon Stockert  MD-I, Chayanne Speir ([id number]) on 01/28/2016 1:55:00 AM       Radiology Dg Chest Port 1 View  Result Date: 01/28/2016 CLINICAL DATA:  Shortness of breath wheezing and cough EXAM: PORTABLE CHEST 1 VIEW COMPARISON:  Chest radiograph 01/20/2016 CT chest 12/24/2015 FINDINGS: Right chest wall Port-A-Cath tip is at the cavoatrial junction. Cardiomediastinal contours are normal. There is no focal airspace consolidation or pulmonary edema. The lungs are hyperexpanded. No pneumothorax or pleural effusion. IMPRESSION: COPD without focal airspace disease. Electronically Signed   By: Ulyses Jarred M.D.   On: 01/28/2016 02:07    Procedures Procedures (including critical care time)  Medications Ordered in  ED Medications  diltiazem (CARDIZEM) 1 mg/mL load via infusion 10 mg (10 mg Intravenous Bolus from Bag 01/28/16 0145)    And  diltiazem (CARDIZEM) 100 mg in dextrose 5 % 100 mL (1 mg/mL) infusion (5 mg/hr Intravenous New Bag/Given 01/28/16 0142)  levalbuterol (XOPENEX) nebulizer solution 1.25 mg (1.25 mg Nebulization Given 01/28/16 0210)  methylPREDNISolone sodium succinate (SOLU-MEDROL) 125 mg/2 mL injection 125 mg (125 mg Intravenous Given 01/28/16 0138)  levalbuterol (XOPENEX) nebulizer solution 1.25 mg (1.25 mg Nebulization Given 01/28/16 0302)  levalbuterol (XOPENEX) nebulizer solution 1.25 mg (1.25 mg Nebulization Given 01/28/16 0358)     Initial Impression / Assessment and Plan / ED Course  I have reviewed the triage vital signs and the nursing notes.  Pertinent labs & imaging results that were available during my care of the patient were reviewed by me and considered in my medical decision making (see chart for details).  Clinical Course    Patient was noted to be in atrial fibrillation with fast ventricular response.  However when I review her prior EKGs she had a EKG on December 17 and October 26 and she was in atrial fib at that time however her rate was controlled. Patient is noted to be wheezing and she was given Xopenex nebulizer. She was given IV steroids.  Recheck 02:25 AM she had just finished her nebulizer. HR is 90-115 on diltiazem drip. On exam she still has diffuse wheezing.   Recheck 3:20 AM patient has had her second Xopenex nebulizer. Her heart rate is around 100 and atrial fibrillation. She states she feels like she still having some rattling in her chest. On exam she is still noted to have some diffuse wheezing but she also now has some lower pitched rhonchi present. Her Xopenex was repeated.  Review of patient's old chart shows she was last admitted on December 20 2 COPD exacerbation. Patient states she noted whenever she gets chemotherapy for her pancreatic cancer it is followed by COPD exacerbation.  Recheck at 05:00 AM patient heart rate noted to be in atrial fibrillation at a rate 100-110. She has had 3 xopenex nebulizer's.  She states she feels like she still has rattling. On exam she has improved air movement however she still has some diffuse rhonchi and wheezing at the bases especially. We discussed admission and patient is very happy.  05:37 AM Dr Olevia Bowens, hospitalist, will admit.   Final Clinical Impressions(s) / ED Diagnoses   Final diagnoses:  COPD exacerbation ([place])  Atrial fibrillation with rapid ventricular response ([place])    Plan admission  Rolland Porter, MD, FACEP  CRITICAL CARE Performed by: Kinzie Wickes L Khylee Algeo Total critical care time: 42 minutes Critical care time was exclusive of separately billable procedures and treating other patients. Critical care was necessary to treat or prevent imminent or life-threatening deterioration. Critical care was time spent personally by me on the following activities: development of treatment plan with patient and/or surrogate as well as  nursing, discussions with consultants, evaluation of patient's response to treatment, examination of patient, obtaining history from patient or surrogate, ordering and performing treatments and interventions, ordering and review of laboratory studies, ordering and review of radiographic studies, pulse oximetry and re-evaluation of patient's condition.    Rolland Porter, MD 01/28/16 [phone]

## 2016-01-28 NOTE — Progress Notes (Signed)
Patient admitted to the hospital earlier this morning by Dr. Olevia Bowens.  Patient seen and examined. She is feeling better. Breathing improving. Lungs are clear on exam. Heart sounds are irregular.  She has been admitted with A fib with RVR on cardizem infusion. Likely precipitated by respiratory issues. Will restart on oral cardizem, continue lopressor and wean off cardizem infusion. She is anticoagulated with xarelto. She is also on IV steroids and nebs for COPD. Breathing appears to be improving.  Lindsay Salazar

## 2016-01-28 NOTE — H&P (Signed)
History and Physical    Lindsay Salazar [id number] DOB: 1950/08/14 DOA: 01/28/2016  PCP: [name], PA-C   Patient coming from: Home.  Chief Complaint: Shortness of breath.  HPI: Lindsay Salazar is a 65 y.o. female with medical history significant of arthritis, asthma, chronic atrial fibrillation, COPD, on home oxygen, headaches, fibromyalgia, GERD hypertension, pancreatic cancer, anxiety who is coming to the emergency department due to worsening of chronic shortness of breath, wheezing, productive cough of grayish sputum after she ran out of her bronchodilators. She was recently admitted and discharged from 12/17 to 12/20. She has been taking prednisone 40 mg daily after discharge, but is not sure if it has helped. She denies fever, but complains of chills and night sweats. She denies earache, sore throat or rhinorrhea.   She also complains of left sided chest wall pleuritic pain, palpitations, but denies dizziness or diaphoresis. She has chronic abdominal pain, but denies nausea, emesis, diarrhea, constipation, melena or hematochezia. She states that she has good appetite. She denies dysuria, hematuria or frequency.   ED Course: The patient was started on a Cardizem infusion due to RVR. She was also given 3 nebs of Xopenex, Solu-Medrol 125 mg IVP and magnesium sulfate 2 g IVPB.  Review of Systems: As per HPI otherwise 10 point review of systems negative.    Past Medical History:  Diagnosis Date  . Anginal pain ([place])    thought she was having   heartburn  . Arthritis    bursitis , fibromyalgia  . Arthritis    "right hip" (11/27/2015)  . Asthma   . Atrial fibrillation ([place])   . Chronic bronchitis ([place])   . Chronic right hip pain   . COPD (chronic obstructive pulmonary disease) ([place])    wears home o2 prn  . Daily headache    "last 2-3 months" (11/27/2015)  . Exposure to TB    "mom had it when she was pregnant with me"  . Fibromyalgia   . GERD (gastroesophageal reflux disease)     use to have it but not now  . Heart murmur    33-  17 years old  . Hypertension   . On home oxygen therapy    11/27/2015 "whenever I need it; don't know how many liters"  . Pancreatic cancer ([place])   . Pneumonia    "several times" (11/27/2015)  . Pneumothorax     Past Surgical History:  Procedure Laterality Date  . ANTERIOR CERVICAL DECOMP/DISCECTOMY FUSION     "put 4 screws in"  . BACK SURGERY    . CARDIAC CATHETERIZATION  08/2004   Archie Endo 06/18/2010  . CARDIOVERSION N/A 07/18/2015   Procedure: CARDIOVERSION;  Surgeon: Josue Hector, MD;  [facility];  Service: Cardiovascular;  Laterality: N/A;  . ESOPHAGOGASTRODUODENOSCOPY N/A 08/09/2015   Procedure: ESOPHAGOGASTRODUODENOSCOPY (EGD);  Surgeon: Rogene Houston, MD;  Location: [facility];  Service: Endoscopy;  Laterality: N/A;  . EUS N/A 11/29/2015   Procedure: UPPER ENDOSCOPIC ULTRASOUND (EUS) RADIAL;  Surgeon: Milus Banister, MD;  Location: [facility];  Service: Endoscopy;  Laterality: N/A;  . IR GENERIC HISTORICAL  12/18/2015   IR US GUIDE VASC ACCESS RIGHT 12/18/2015 Sandi Mariscal, MD [facility]  . IR GENERIC HISTORICAL  12/18/2015   IR FLUORO GUIDE PORT INSERTION RIGHT 12/18/2015 Sandi Mariscal, MD [facility]  . TONSILLECTOMY    . TUBAL LIGATION       reports that she quit smoking about 1 months ago. Her  smoking use included Cigarettes. She started smoking about 46 years ago. She has a 23.00 pack-year smoking history. She has never used smokeless tobacco. She reports that she does not drink alcohol or use drugs.  Allergies  Allergen Reactions  . Ibuprofen Nausea And Vomiting  . Penicillins Swelling    Has patient had a PCN reaction causing immediate rash, facial/tongue/throat swelling, SOB or lightheadedness with hypotension: Yes Has patient had a PCN reaction causing severe rash involving mucus membranes or skin necrosis: No Has patient had a PCN reaction that required hospitalization No Has patient had  a PCN reaction occurring within the last 10 years: No If all of the above answers are "NO", then may proceed with Cephalosporin use.   . Tiotropium Bromide Monohydrate Itching  . Tramadol Nausea And Vomiting    Family History  Problem Relation Age of Onset  . COPD Mother   . Diabetes Father   . Stroke Father     Prior to Admission medications   Medication Sig Start Date End Date Taking? Authorizing Provider  albuterol (PROVENTIL) (2.5 MG/3ML) 0.083% nebulizer solution Take 3 mLs (2.5 mg total) by nebulization every 6 (six) hours as needed for wheezing or shortness of breath. 12/03/15   Ripudeep Krystal Eaton, MD  COMBIVENT RESPIMAT 20-100 MCG/ACT AERS respimat Inhale 1 puff into the lungs 2 (two) times daily. 12/03/15   Ripudeep Krystal Eaton, MD  diltiazem (CARDIZEM CD) 240 MG 24 hr capsule Take 1 capsule (240 mg total) by mouth daily. 12/03/15   Ripudeep Krystal Eaton, MD  escitalopram (LEXAPRO) 10 MG tablet Take 1 tablet (10 mg total) by mouth every evening. 12/03/15   Ripudeep Krystal Eaton, MD  furosemide (LASIX) 20 MG tablet Take 1 tablet (20 mg total) by mouth daily as needed for edema. Patient taking differently: Take 20 mg by mouth daily as needed for fluid or edema.  07/06/15   Arnoldo Lenis, MD  Gemcitabine HCl (GEMZAR IV) Inject into the vein. Day 1, day 8, day 15, every 28 days    Historical Provider, MD  guaiFENesin (ROBITUSSIN) 100 MG/5ML SOLN Take 10 mLs (200 mg total) by mouth every 4 (four) hours as needed for cough or to loosen phlegm. 01/23/16   Orvan Falconer, MD  HYDROcodone-acetaminophen (NORCO/VICODIN) 5-325 MG tablet Take 1 tablet by mouth every 6 (six) hours as needed for moderate pain (Must last 30 days.Do not take and drive a car or use machinery.). 01/10/16   Sanjuana Kava, MD  ipratropium (ATROVENT) 0.02 % nebulizer solution Take 2.5 mLs (0.5 mg total) by nebulization 3 (three) times daily. 01/23/16   Orvan Falconer, MD  ipratropium-albuterol (DUONEB) 0.5-2.5 (3) MG/3ML SOLN Inhale 3 mLs into the  lungs every 6 (six) hours as needed (for shortness of breath). 12/03/15   Ripudeep Krystal Eaton, MD  metoprolol tartrate 37.5 MG TABS Take 37.5 mg by mouth 2 (two) times daily. 12/03/15   Ripudeep Krystal Eaton, MD  nicotine (NICODERM CQ - DOSED IN MG/24 HOURS) 14 mg/24hr patch Place 1 patch (14 mg total) onto the skin daily. 12/03/15   Ripudeep Krystal Eaton, MD  PACLitaxel Protein-Bound Part (ABRAXANE IV) Inject into the vein. Day 1, day 8, day 15, every 28 days    Historical Provider, MD  pantoprazole (PROTONIX) 40 MG tablet Take 1 tablet (40 mg total) by mouth daily. 12/03/15   Ripudeep Krystal Eaton, MD  potassium chloride SA (K-DUR,KLOR-CON) 20 MEQ tablet Take 2 tablets (40 mEq total) by mouth 2 (two) times daily. 01/02/16  04/01/16  Baird Cancer, PA-C  predniSONE (DELTASONE) 20 MG tablet Take 1 tablet (20 mg total) by mouth 2 (two) times daily with a meal. 01/23/16 01/28/16  Orvan Falconer, MD  rivaroxaban (XARELTO) 20 MG TABS tablet Take 1 tablet (20 mg total) by mouth daily with supper. 07/19/15   Reyne Dumas, MD  SYMBICORT 160-4.5 MCG/ACT inhaler Inhale 1 puff into the lungs 2 (two) times daily. 12/03/15   Ripudeep Krystal Eaton, MD    Physical Exam:  Constitutional: NAD, calm, comfortable Vitals:   01/28/16 0430 01/28/16 0515 01/28/16 0545 01/28/16 0615  BP: 106/67 111/75 117/65   Pulse: 109 77 (!) 33 82  Resp: 19 23  22   Temp:      TempSrc:      SpO2: 98% 98% 100% 100%  Weight:      Height:       Eyes: PERRL, lids and conjunctivae normal ENMT: Mucous membranes are moist. Posterior pharynx clear of any exudate or lesions.Poor state of repair of dentition.  Neck: normal, supple, no masses, no thyromegaly Respiratory: Good air entry with mild wheezing, no crackles. Normal respiratory effort. No accessory muscle use.  Cardiovascular: Irregularly irregular, no murmurs / rubs / gallops. No extremity edema. 2+ pedal pulses. No carotid bruits.  Abdomen: Soft, mild epigastric tenderness, no guarding/rebound/masses palpated.  No hepatosplenomegaly. Bowel sounds positive.  Musculoskeletal: no clubbing / cyanosis.Good ROM, no contractures. Normal muscle tone.  Skin: no rashes, lesions, ulcers. No induration Neurologic: CN 2-12 grossly intact. Sensation intact, DTR normal. Strength 5/5 in all 4.  Psychiatric:  Alert and oriented x 4. Normal mood.    Labs on Admission: I have personally reviewed following labs and imaging studies  CBC:  Recent Labs Lab 01/24/16 0958 01/28/16 0119  WBC 13.1* 9.7  NEUTROABS 11.2* 7.7  HGB 8.5* 10.0*  HCT 27.0* 31.4*  MCV 85.2 84.2  PLT 454* 123456*   Basic Metabolic Panel:  Recent Labs Lab 01/24/16 0958 01/28/16 0119  NA 138 137  K 3.9 4.9  CL 106 105  CO2 24 25  GLUCOSE 127* 116*  BUN 24* 30*  CREATININE 0.96 0.82  CALCIUM 8.8* 8.6*   GFR: Estimated Creatinine Clearance: 57.5 mL/min (by C-G formula based on SCr of 0.82 mg/dL). Liver Function Tests:  Recent Labs Lab 01/24/16 0958 01/28/16 0119  AST 28 30  ALT 28 33  ALKPHOS 69 75  BILITOT 0.4 0.9  PROT 6.3* 6.4*  ALBUMIN 3.3* 3.4*   No results for input(s): LIPASE, AMYLASE in the last 168 hours. No results for input(s): AMMONIA in the last 168 hours. Coagulation Profile: No results for input(s): INR, PROTIME in the last 168 hours. Cardiac Enzymes:  Recent Labs Lab 01/28/16 0119  TROPONINI <0.03   BNP (last 3 results) No results for input(s): PROBNP in the last 8760 hours. HbA1C: No results for input(s): HGBA1C in the last 72 hours. CBG: No results for input(s): GLUCAP in the last 168 hours. Lipid Profile: No results for input(s): CHOL, HDL, LDLCALC, TRIG, CHOLHDL, LDLDIRECT in the last 72 hours. Thyroid Function Tests: No results for input(s): TSH, T4TOTAL, FREET4, T3FREE, THYROIDAB in the last 72 hours. Anemia Panel: No results for input(s): VITAMINB12, FOLATE, FERRITIN, TIBC, IRON, RETICCTPCT in the last 72 hours. Urine analysis:    Component Value Date/Time   COLORURINE YELLOW  12/24/2015 [place] 12/24/2015 1612   LABSPEC 1.025 12/24/2015 1612   PHURINE 6.0 12/24/2015 1612   GLUCOSEU NEGATIVE 12/24/2015 1612  HGBUR MODERATE (A) 12/24/2015 1612   BILIRUBINUR NEGATIVE 12/24/2015 1612   KETONESUR NEGATIVE 12/24/2015 1612   PROTEINUR NEGATIVE 12/24/2015 1612   NITRITE NEGATIVE 12/24/2015 1612   LEUKOCYTESUR SMALL (A) 12/24/2015 1612    Radiological Exams on Admission: Dg Chest Port 1 View  Result Date: 01/28/2016 CLINICAL DATA:  Shortness of breath wheezing and cough EXAM: PORTABLE CHEST 1 VIEW COMPARISON:  Chest radiograph 01/20/2016 CT chest 12/24/2015 FINDINGS: Right chest wall Port-A-Cath tip is at the cavoatrial junction. Cardiomediastinal contours are normal. There is no focal airspace consolidation or pulmonary edema. The lungs are hyperexpanded. No pneumothorax or pleural effusion. IMPRESSION: COPD without focal airspace disease. Electronically Signed   By: Ulyses Jarred M.D.   On: 01/28/2016 02:07    EKG: Independently reviewed. Vent. rate 115 BPM PR interval * ms QRS duration 79 ms QT/QTc 325/430 ms P-R-T axes * 69 12 Atrial fibrillation Ventricular premature complex Anteroseptal infarct, old Repol abnrm suggests ischemia, diffuse leads Faster rate when compared to last tracing.  Assessment/Plan Principal Problem:   Atrial fibrillation with rapid ventricular response (HCC) Admit to stepdown/inpatient. Continue supplemental oxygen. Continue Cardizem infusion. Continue oral metoprolol. Trend troponin levels. Keep electrolytes optimize.  Active Problems:   COPD with acute exacerbation (HCC) Continue supplemental oxygen. Levalbuterol nebulizer treatment every 6 hrs. Ipratropium nebs every 6 hours. Solu-Medrol 80 mg IVP every 6 hours.    Anemia Monitor H&H.    Pancreatic cancer [facility])  Continue chemotherapy and follow-ups per oncology. Norco as needed for pain.     Hypertension Continue Cardizem. Continue  metoprolol 37.5 mg by mouth twice a day.    Anxiety  Continue Lexapro 10 mg by mouth daily.     DVT prophylaxis: Lovenox SQ. Code Status: Full code. Family Communication:  Disposition Plan: Admit for cardiac rate control and COPD exacerbation treatment. Consults called:  Admission status: Inpatient/stepdown.   Reubin Milan MD [facility] Pager [phone].  If 7PM-7AM, please contact night-coverage [email or web address] Password [email or web address]  01/28/2016, 6:20 AM

## 2016-01-28 NOTE — ED Notes (Signed)
Pt ambulated to restroom & returned to room w/ no complications. 

## 2016-01-29 ENCOUNTER — Other Ambulatory Visit: Payer: Self-pay | Admitting: Cardiology

## 2016-01-29 DIAGNOSIS — D649 Anemia, unspecified: Secondary | ICD-10-CM

## 2016-01-29 DIAGNOSIS — C251 Malignant neoplasm of body of pancreas: Secondary | ICD-10-CM

## 2016-01-29 DIAGNOSIS — I10 Essential (primary) hypertension: Secondary | ICD-10-CM

## 2016-01-29 DIAGNOSIS — I4891 Unspecified atrial fibrillation: Secondary | ICD-10-CM

## 2016-01-29 LAB — RAPID STREP SCREEN (MED CTR MEBANE ONLY): STREPTOCOCCUS, GROUP A SCREEN (DIRECT): NEGATIVE

## 2016-01-29 LAB — CBC
HCT: 26.9 % — ABNORMAL LOW (ref 36.0–46.0)
HEMOGLOBIN: 8.4 g/dL — AB (ref 12.0–15.0)
MCH: 26.3 pg (ref 26.0–34.0)
MCHC: 31.2 g/dL (ref 30.0–36.0)
MCV: 84.3 fL (ref 78.0–100.0)
PLATELETS: 392 10*3/uL (ref 150–400)
RBC: 3.19 MIL/uL — AB (ref 3.87–5.11)
RDW: 21.3 % — ABNORMAL HIGH (ref 11.5–15.5)
WBC: 10.4 10*3/uL (ref 4.0–10.5)

## 2016-01-29 LAB — BASIC METABOLIC PANEL
ANION GAP: 9 (ref 5–15)
BUN: 27 mg/dL — ABNORMAL HIGH (ref 6–20)
CALCIUM: 8.4 mg/dL — AB (ref 8.9–10.3)
CO2: 22 mmol/L (ref 22–32)
CREATININE: 1.12 mg/dL — AB (ref 0.44–1.00)
Chloride: 103 mmol/L (ref 101–111)
GFR, EST AFRICAN AMERICAN: 58 mL/min — AB (ref >60)
GFR, EST NON AFRICAN AMERICAN: 50 mL/min — AB (ref >60)
Glucose, Bld: 241 mg/dL — ABNORMAL HIGH (ref 65–99)
Potassium: 4.4 mmol/L (ref 3.5–5.1)
SODIUM: 134 mmol/L — AB (ref 135–145)

## 2016-01-29 MED ORDER — LEVOFLOXACIN 750 MG PO TABS
750.0000 mg | ORAL_TABLET | ORAL | Status: DC
  Administered 2016-01-29 – 2016-01-31 (×2): 750 mg via ORAL
  Filled 2016-01-29 (×3): qty 1

## 2016-01-29 MED ORDER — AMIODARONE LOAD VIA INFUSION
150.0000 mg | Freq: Once | INTRAVENOUS | Status: AC
  Administered 2016-01-29: 150 mg via INTRAVENOUS
  Filled 2016-01-29: qty 83.34

## 2016-01-29 MED ORDER — ENSURE ENLIVE PO LIQD
237.0000 mL | Freq: Two times a day (BID) | ORAL | Status: DC
  Administered 2016-01-29 – 2016-02-05 (×7): 237 mL via ORAL

## 2016-01-29 MED ORDER — AMIODARONE HCL IN DEXTROSE 360-4.14 MG/200ML-% IV SOLN
INTRAVENOUS | Status: AC
  Filled 2016-01-29: qty 200

## 2016-01-29 MED ORDER — AMIODARONE HCL IN DEXTROSE 360-4.14 MG/200ML-% IV SOLN
30.0000 mg/h | INTRAVENOUS | Status: DC
  Administered 2016-01-30 (×2): 30 mg/h via INTRAVENOUS
  Filled 2016-01-29: qty 200

## 2016-01-29 MED ORDER — AMIODARONE HCL IN DEXTROSE 360-4.14 MG/200ML-% IV SOLN
60.0000 mg/h | INTRAVENOUS | Status: AC
  Administered 2016-01-29 (×2): 60 mg/h via INTRAVENOUS
  Filled 2016-01-29 (×2): qty 200

## 2016-01-29 NOTE — Care Management Important Message (Signed)
Important Message  Patient Details  Name: Lindsay Salazar MRN: [id number] Date of Birth: 1950/05/24   Medicare Important Message Given:  Yes    Sherald Barge, RN 01/29/2016, 10:34 AM

## 2016-01-29 NOTE — Progress Notes (Signed)
Initial Nutrition Assessment  DOCUMENTATION CODES:  Non-severe (moderate) malnutrition in context of chronic illness   Pt meets criteria for Moderate MALNUTRITION in the context of Chronic Illness as evidenced by Mild muscle wasting and moderate fat wasting.  INTERVENTION:  Ensure Enlive po BID, each supplement provides 350 kcal and 20 grams of protein  Will order Case of Ensure for patient to pick up as an outpatient at cancer center.   Left coupons and handouts titled "Increasing calories and protein"   NUTRITION DIAGNOSIS:  Increased nutrient needs related to cancer and cancer related treatments, chronic illness (COPD, dependant on 02) as evidenced by estimated nutritional requirements for these conditions  GOAL:  Patient will meet greater than or equal to 90% of their needs  MONITOR:  PO intake, Supplement acceptance, Labs, Weight trends, I & O's  REASON FOR ASSESSMENT:  Consult COPD Protocol  ASSESSMENT:  65 y/o female PMhx arthritis, asthma, COPD on 2L, fibromyalgia, GERD, HTN, Pancreatic Cancer undergoing chemotherapy, anxiety. Presented with worsening SOB, wheezing, productive cough of grayish sputum. Admitted for management of COPD exacerbation and afib.   Pt says that, despite her cancer and chemotherapy, she is eating very well. She denies having any changes in her appetite or any side effects whatsoever from her treatment. Denies n/v/c/d. She says she eats peanuts when she is constipated and these help her have a BM.   In regards to her COPD, she admits she is very SOB from minimal exertion. She becomes SOB when eating.  At baseline, she eats a couple meals per day, as well as a couple "light snacks". She did not take any vitamins. She did not drink any type of nutritional beverages, but that is because they are cost prohibitive; she would drink them if they were free.   Discussed Ensure Assistance program at [facility]. She was interested in obtaining a case of Ensure.    Briefly touched on food sources high in protein. She sounds to eat many high protein foods such as blocks of cheese, peanut butter and meat.   She does not have any complaints at this time other than her SOB. She is eager to continue treatment and have her surgery so they can "scrape away the dead cancer cells". She does not seem to be fully aware of the magnitude of the surgery. Encouraged her to prioritize protein sources.   She says her UBW is 150 lbs? Though she doesn't appear to have been this heavy recently. Appears to have been admitted at 124.3 lbs. Prior to this encounter, her weight had been stable at 126-132 lbs for foreseeable past. She does not believe her clothes feel any looser.   Physical Exam: Exhibits mild muscle wasting and moderate fat wasting noted.   Medications: Hydrocodone, abx, ppi, prednisone,  Labs: Mg: 2.9 Glucose: 241,    Recent Labs Lab 01/24/16 0958 01/28/16 0119 01/28/16 0648 01/29/16 0442  NA 138 137  --  134*  K 3.9 4.9  --  4.4  CL 106 105  --  103  CO2 24 25  --  22  BUN 24* 30*  --  27*  CREATININE 0.96 0.82  --  1.12*  CALCIUM 8.8* 8.6*  --  8.4*  MG  --   --  2.9*  --   PHOS  --   --  3.8  --   GLUCOSE 127* 116*  --  241*   Diet Order:  Diet Heart Room service appropriate? Yes; Fluid consistency: Thin  Skin:  Reviewed, no issues  Last BM:  12/25  Height:  Ht Readings from Last 1 Encounters:  01/28/16 5\' 4"  (1.626 m)   Weight:  Wt Readings from Last 1 Encounters:  01/29/16 132 lb 15 oz (60.3 kg)   Wt Readings from Last 10 Encounters:  01/29/16 132 lb 15 oz (60.3 kg)  01/24/16 132 lb (59.9 kg)  01/21/16 129 lb 10.1 oz (58.8 kg)  01/09/16 123 lb 1.6 oz (55.8 kg)  01/02/16 126 lb 6.4 oz (57.3 kg)  12/26/15 127 lb 4.8 oz (57.7 kg)  12/24/15 126 lb (57.2 kg)  12/19/15 126 lb 9.6 oz (57.4 kg)  12/11/15 131 lb (59.4 kg)  12/06/15 132 lb 6.4 oz (60.1 kg)   Ideal Body Weight:  54.55 kg  BMI:  Body mass index is 22.82  kg/m.  Estimated Nutritional Needs:  Kcal:  1700-1900 (30-34 kca/kg bw) Protein:  80-90 g (1.4-1.6 g/kg bw) Fluid:  >1.7 L (30 ml/kg bw)  EDUCATION NEEDS:  Education needs addressed  Burtis Junes RD, LDN, CNSC Clinical Nutrition Pager: [phone] 01/29/2016 12:49 PM

## 2016-01-29 NOTE — Consult Note (Signed)
Primary cardiologist: Dr [name] Consulting cardiologist: Dr [name] Requesting physician: Dr Jay Schlichter Indication: afib with RVR  Clinical Summary Lindsay Salazar is a 65 y.o.female history of COPD, PAF s/p DCCV June 2017, prevouis GI bleed related to heavy NSAID use, pancreatic cancer,HTN admitted with SOB and productive cough. Being treated for COPD exacerbation, cardiology is consulted for afib with RVR.  K 4.9 Cr 0.82 BNP 113 WBC 9.7 Hgb 10 Plt 424 Mg 2.9  Trop neg x 2 CXR COPD without acute process 06/2015 echo: LVEF 55%, mild MR   Allergies  Allergen Reactions  . Ibuprofen Nausea And Vomiting  . Penicillins Swelling    Has patient had a PCN reaction causing immediate rash, facial/tongue/throat swelling, SOB or lightheadedness with hypotension: Yes Has patient had a PCN reaction causing severe rash involving mucus membranes or skin necrosis: No Has patient had a PCN reaction that required hospitalization No Has patient had a PCN reaction occurring within the last 10 years: No If all of the above answers are "NO", then may proceed with Cephalosporin use.   . Tiotropium Bromide Monohydrate Itching  . Tramadol Nausea And Vomiting    Medications Scheduled Medications: . diltiazem  60 mg Oral Q6H  . escitalopram  10 mg Oral QPM  . HYDROcodone-acetaminophen  1 tablet Oral Once  . ipratropium  0.5 mg Nebulization Q6H  . levalbuterol  1.25 mg Nebulization Q6H  . levofloxacin  750 mg Oral Q48H  . metoprolol tartrate  37.5 mg Oral BID  . nicotine  14 mg Transdermal Daily  . pantoprazole  40 mg Oral Daily  . predniSONE  40 mg Oral Q supper  . rivaroxaban  20 mg Oral Q supper     Infusions: . diltiazem (CARDIZEM) infusion Stopped (01/28/16 1330)     PRN Medications:  furosemide, guaiFENesin, HYDROcodone-acetaminophen   Past Medical History:  Diagnosis Date  . Anginal pain ([place])    thought she was having   heartburn  . Arthritis    bursitis ,  fibromyalgia  . Arthritis    "right hip" (11/27/2015)  . Asthma   . Atrial fibrillation ([place])   . Chronic bronchitis ([place])   . Chronic right hip pain   . COPD (chronic obstructive pulmonary disease) ([place])    wears home o2 prn  . Daily headache    "last 2-3 months" (11/27/2015)  . Exposure to TB    "mom had it when she was pregnant with me"  . Fibromyalgia   . GERD (gastroesophageal reflux disease)    use to have it but not now  . Heart murmur    62-  34 years old  . Hypertension   . On home oxygen therapy    11/27/2015 "whenever I need it; don't know how many liters"  . Pancreatic cancer ([place])   . Pneumonia    "several times" (11/27/2015)  . Pneumothorax     Past Surgical History:  Procedure Laterality Date  . ANTERIOR CERVICAL DECOMP/DISCECTOMY FUSION     "put 4 screws in"  . BACK SURGERY    . CARDIAC CATHETERIZATION  08/2004   Archie Endo 06/18/2010  . CARDIOVERSION N/A 07/18/2015   Procedure: CARDIOVERSION;  Surgeon: Josue Hector, MD;  [facility];  Service: Cardiovascular;  Laterality: N/A;  . ESOPHAGOGASTRODUODENOSCOPY N/A 08/09/2015   Procedure: ESOPHAGOGASTRODUODENOSCOPY (EGD);  Surgeon: Rogene Houston, MD;  [facility];  Service: Endoscopy;  Laterality: N/A;  . EUS N/A 11/29/2015   Procedure:  UPPER ENDOSCOPIC ULTRASOUND (EUS) RADIAL;  Surgeon: Milus Banister, MD;  [facility];  Service: Endoscopy;  Laterality: N/A;  . IR GENERIC HISTORICAL  12/18/2015   IR US GUIDE VASC ACCESS RIGHT 12/18/2015 Sandi Mariscal, MD [facility]  . IR GENERIC HISTORICAL  12/18/2015   IR FLUORO GUIDE PORT INSERTION RIGHT 12/18/2015 Sandi Mariscal, MD [facility]  . TONSILLECTOMY    . TUBAL LIGATION      Family History  Problem Relation Age of Onset  . COPD Mother   . Diabetes Father   . Stroke Father     Social History Ms. Loring reports that she quit smoking about 2 months ago. Her smoking use included Cigarettes. She started smoking about 46 years  ago. She has a 23.00 pack-year smoking history. She has never used smokeless tobacco. Ms. Stringfellow reports that she does not drink alcohol.  Review of Systems CONSTITUTIONAL: No weight loss, fever, chills, weakness or fatigue.  HEENT: Eyes: No visual loss, blurred vision, double vision or yellow sclerae. No hearing loss, sneezing, congestion, runny nose or sore throat.  SKIN: No rash or itching.  CARDIOVASCULAR: per HPI RESPIRATORY: per HPI GASTROINTESTINAL: No anorexia, nausea, vomiting or diarrhea. No abdominal pain or blood.  GENITOURINARY: no polyuria, no dysuria NEUROLOGICAL: No headache, dizziness, syncope, paralysis, ataxia, numbness or tingling in the extremities. No change in bowel or bladder control.  MUSCULOSKELETAL: No muscle, back pain, joint pain or stiffness.  HEMATOLOGIC: No anemia, bleeding or bruising.  LYMPHATICS: No enlarged nodes. No history of splenectomy.  PSYCHIATRIC: No history of depression or anxiety.      Physical Examination Blood pressure (!) 96/47, pulse (!) 127, temperature 99.2 F (37.3 C), temperature source Oral, resp. rate (!) 25, height 5\' 4"  (1.626 m), weight 132 lb 15 oz (60.3 kg), SpO2 98 %.  Intake/Output Summary (Last 24 hours) at 01/29/16 0957 Last data filed at 01/29/16 0800  Gross per 24 hour  Intake          1383.92 ml  Output                0 ml  Net          1383.92 ml    HEENT: sclera clear, throat clear  Cardiovascular: irreg, rate 100s, no m/r/g  Respiratory: bilateral wheezing  GI: abdomen soft, NT, ND  MSK: no LE edema  Neuro: no focal deficits  Psych: appropriate affect   Lab Results  Basic Metabolic Panel:  Recent Labs Lab 01/24/16 0958 01/28/16 0119 01/28/16 0648 01/29/16 0442  NA 138 137  --  134*  K 3.9 4.9  --  4.4  CL 106 105  --  103  CO2 24 25  --  22  GLUCOSE 127* 116*  --  241*  BUN 24* 30*  --  27*  CREATININE 0.96 0.82  --  1.12*  CALCIUM 8.8* 8.6*  --  8.4*  MG  --   --  2.9*  --   PHOS   --   --  3.8  --     Liver Function Tests:  Recent Labs Lab 01/24/16 0958 01/28/16 0119  AST 28 30  ALT 28 33  ALKPHOS 69 75  BILITOT 0.4 0.9  PROT 6.3* 6.4*  ALBUMIN 3.3* 3.4*    CBC:  Recent Labs Lab 01/24/16 0958 01/28/16 0119 01/29/16 0442  WBC 13.1* 9.7 10.4  NEUTROABS 11.2* 7.7  --   HGB 8.5* 10.0* 8.4*  HCT 27.0* 31.4* 26.9*  MCV 85.2 84.2 84.3  PLT 454* 424* 392    Cardiac Enzymes:  Recent Labs Lab 01/28/16 0119 01/28/16 0648 01/28/16 1244  TROPONINI <0.03 <0.03 <0.03    BNP: Invalid input(s): POCBNP     Impression/Recommendations 1. Afib with RVR - history of afib, current afib with RVR  exacerbated by her COPD exacerbation - home regimen lopressor 37.5mg  bid, dilt 240 daily.  - currently on dilt gtt, dilt 60 po q 6hrs, lopressor 37.5mg  bid.  - CHADS2Vasc score is 3, she is on xarelto - soft bp's on current regiment, rates not controlled. Will start amiodarone IV, likely will require only a short duration of therapy until her acute respiratory issues have resolved and could get back to her previous rate control strategy. We will hold lopressor since starting amio, continue oral dilt.   [name], M.D.

## 2016-01-29 NOTE — Care Management Note (Signed)
Case Management Note  Patient Details  Name: Lindsay Salazar MRN: [id number] Date of Birth: Mar 11, 1950  Subjective/Objective:                  Pt admitted with A-fib with RVR. Pt is from home, lives with her bf and his brother. Pt is ind with ADL's. She has 24/7 supplemental oxygen and neb machine. She plans to return home with self care. She is refusing HH because her home is in such bad shape she feels she has to sit on the porch for the [facility] visit and it is too cold for her to sit outside this time of year. Pt talks about discussing moving to an apartment with her bf, something smaller that won't require so much housework as she is unable to do cleaning. They have to made final decision but they are thinking about it.   Action/Plan: No CM needs anticipated.   Expected Discharge Date:  02/02/16               Expected Discharge Plan:  Home/Self Care  In-House Referral:  NA  Discharge planning Services  CM Consult  Post Acute Care Choice:  NA Choice offered to:  NA  Status of Service:  Completed, signed off  Sherald Barge, RN 01/29/2016, 10:35 AM

## 2016-01-29 NOTE — Progress Notes (Signed)
Notified Dr. Harl Bowie office of consult.

## 2016-01-29 NOTE — Progress Notes (Signed)
PHARMACY NOTE:  ANTIMICROBIAL RENAL DOSAGE ADJUSTMENT  Current antimicrobial regimen includes a mismatch between antimicrobial dosage and estimated renal function.  As per policy approved by the Pharmacy & Therapeutics and Medical Executive Committees, the antimicrobial dosage will be adjusted accordingly.  Current antimicrobial dosage:  Levaquin 750 mg po daily  Indication: COPD exaceration  Renal Function:  Estimated Creatinine Clearance: 42.1 mL/min (by C-G formula based on SCr of 1.12 mg/dL (H)). []      On intermittent HD, scheduled: []      On CRRT    Antimicrobial dosage has been changed to:  Levaquin 750 mg po every 48 hours  Additional comments:   Thank you for allowing pharmacy to be a part of this patient's care.  Gildardo Griffes [place], [facility] 01/29/2016 8:59 AM

## 2016-01-29 NOTE — Progress Notes (Signed)
PROGRESS NOTE    Lindsay Salazar  [id number] DOB: 01-23-1951 DOA: 01/28/2016 PCP: [name], PA-C    Brief Narrative:  65 y/o female with history of COPD on home oxygen and A fib, presented to the ED with shortness of breath and found to be wheezing. She was also noted to have rapid atrial fibrillation. She was admitted to the hospital and started on IV steroids, nebs and cardizem infusion.   Assessment & Plan:   Principal Problem:   Atrial fibrillation with rapid ventricular response (HCC) Active Problems:   Anemia   Pancreatic cancer (HCC)   COPD with acute exacerbation (HCC)   Hypertension   Anxiety   Atrial fibrillation with RVR. Likely precipitated by respiratory issues. She was initially started on cardizem infusion, but has since been transitioned back to oral cardizem. She is also on oral metoprolol. Heart rate remains uncontrolled and is often going to 120's at rest. Blood pressure is on lower side, making further adjustment of meds challenging. Will request cardiology assistance. She is anticoagulated with Xarelto  COPD exacerbation. Patient presented with wheezing and shortness of breath. She was started on neb treatments and IV steroids. She continues to wheeze, so will continue with current treatments. Add levaquin. She is also complaining of sore throat. Will check rapid strep  Chronic respiratory failure with hypoxia. She is chronically on 2L of oxygen  HTN. Currently stable. Blood pressure mildly low. Continue to follow.  Anemia. Chronic. Hemoglobin is near baseline. Continue to follow  Pancreatic cancer. Continue to follow up with oncology. Chemo treatments per oncology.  DVT prophylaxis: xarelto Code Status: full Family Communication: no family present Disposition Plan: discharge home once improved.   Consultants:     Procedures:     Antimicrobials:   levaquin 12/26>>    Subjective: Has some sore throat developing. Still feels short of  breath. Heart rate jumps up when using the bathroom  Objective: Vitals:   01/29/16 0632 01/29/16 0700 01/29/16 0800 01/29/16 0821  BP: (!) 119/92 112/76 (!) 96/47   Pulse:  (!) 105 (!) 127   Resp:  (!) 27 (!) 25   Temp:   99.2 F (37.3 C)   TempSrc:   Oral   SpO2:  99% 99% 98%  Weight:      Height:        Intake/Output Summary (Last 24 hours) at 01/29/16 0854 Last data filed at 01/29/16 0800  Gross per 24 hour  Intake          1863.92 ml  Output                0 ml  Net          1863.92 ml   Filed Weights   01/28/16 0645 01/28/16 0843 01/29/16 0500  Weight: 56.4 kg (124 lb 5.4 oz) 56.4 kg (124 lb 5.4 oz) 60.3 kg (132 lb 15 oz)    Examination:  General exam: Appears calm and comfortable  Respiratory system: bilateral wheezing. Respiratory effort normal. Cardiovascular system: S1 & S2 heard, irregular. No JVD, murmurs, rubs, gallops or clicks. No pedal edema. Gastrointestinal system: Abdomen is nondistended, soft and nontender. No organomegaly or masses felt. Normal bowel sounds heard. Central nervous system: Alert and oriented. No focal neurological deficits. Extremities: Symmetric 5 x 5 power. Skin: No rashes, lesions or ulcers Psychiatry: Judgement and insight appear normal. Mood & affect appropriate.     Data Reviewed: I have personally reviewed following labs and imaging studies  CBC:  Recent Labs Lab 01/24/16 0958 01/28/16 0119 01/29/16 0442  WBC 13.1* 9.7 10.4  NEUTROABS 11.2* 7.7  --   HGB 8.5* 10.0* 8.4*  HCT 27.0* 31.4* 26.9*  MCV 85.2 84.2 84.3  PLT 454* 424* 0000000   Basic Metabolic Panel:  Recent Labs Lab 01/24/16 0958 01/28/16 0119 01/28/16 0648 01/29/16 0442  NA 138 137  --  134*  K 3.9 4.9  --  4.4  CL 106 105  --  103  CO2 24 25  --  22  GLUCOSE 127* 116*  --  241*  BUN 24* 30*  --  27*  CREATININE 0.96 0.82  --  1.12*  CALCIUM 8.8* 8.6*  --  8.4*  MG  --   --  2.9*  --   PHOS  --   --  3.8  --    GFR: Estimated Creatinine  Clearance: 42.1 mL/min (by C-G formula based on SCr of 1.12 mg/dL (H)). Liver Function Tests:  Recent Labs Lab 01/24/16 0958 01/28/16 0119  AST 28 30  ALT 28 33  ALKPHOS 69 75  BILITOT 0.4 0.9  PROT 6.3* 6.4*  ALBUMIN 3.3* 3.4*   No results for input(s): LIPASE, AMYLASE in the last 168 hours. No results for input(s): AMMONIA in the last 168 hours. Coagulation Profile: No results for input(s): INR, PROTIME in the last 168 hours. Cardiac Enzymes:  Recent Labs Lab 01/28/16 0119 01/28/16 0648 01/28/16 1244  TROPONINI <0.03 <0.03 <0.03   BNP (last 3 results) No results for input(s): PROBNP in the last 8760 hours. HbA1C: No results for input(s): HGBA1C in the last 72 hours. CBG: No results for input(s): GLUCAP in the last 168 hours. Lipid Profile: No results for input(s): CHOL, HDL, LDLCALC, TRIG, CHOLHDL, LDLDIRECT in the last 72 hours. Thyroid Function Tests: No results for input(s): TSH, T4TOTAL, FREET4, T3FREE, THYROIDAB in the last 72 hours. Anemia Panel: No results for input(s): VITAMINB12, FOLATE, FERRITIN, TIBC, IRON, RETICCTPCT in the last 72 hours. Sepsis Labs: No results for input(s): PROCALCITON, LATICACIDVEN in the last 168 hours.  Recent Results (from the past 240 hour(s))  MRSA PCR Screening     Status: Abnormal   Collection Time: 01/28/16  6:53 AM  Result Value Ref Range Status   MRSA by PCR INVALID RESULTS, SPECIMEN SENT FOR CULTURE (A) NEGATIVE Final    Comment: Results Called to: HYLTON L. AT 1000A ON [id number] BY THOMPSON S.        The GeneXpert MRSA Assay (FDA approved for NASAL specimens only), is one component of a comprehensive MRSA colonization surveillance program. It is not intended to diagnose MRSA infection nor to guide or monitor treatment for MRSA infections.          Radiology Studies: Dg Chest Port 1 View  Result Date: 01/28/2016 CLINICAL DATA:  Shortness of breath wheezing and cough EXAM: PORTABLE CHEST 1 VIEW COMPARISON:   Chest radiograph 01/20/2016 CT chest 12/24/2015 FINDINGS: Right chest wall Port-A-Cath tip is at the cavoatrial junction. Cardiomediastinal contours are normal. There is no focal airspace consolidation or pulmonary edema. The lungs are hyperexpanded. No pneumothorax or pleural effusion. IMPRESSION: COPD without focal airspace disease. Electronically Signed   By: Ulyses Jarred M.D.   On: 01/28/2016 02:07        Scheduled Meds: . diltiazem  60 mg Oral Q6H  . escitalopram  10 mg Oral QPM  . HYDROcodone-acetaminophen  1 tablet Oral Once  . ipratropium  0.5 mg Nebulization Q6H  .  levalbuterol  1.25 mg Nebulization Q6H  . metoprolol tartrate  37.5 mg Oral BID  . nicotine  14 mg Transdermal Daily  . pantoprazole  40 mg Oral Daily  . predniSONE  40 mg Oral Q supper  . rivaroxaban  20 mg Oral Q supper   Continuous Infusions: . diltiazem (CARDIZEM) infusion Stopped (01/28/16 1330)     LOS: 1 day    Time spent: 23mins    MEMON,JEHANZEB, MD [facility] Pager [phone]  If 7PM-7AM, please contact night-coverage [email or web address] Password [facility] 01/29/2016, 8:54 AM

## 2016-01-30 ENCOUNTER — Ambulatory Visit (HOSPITAL_COMMUNITY): Payer: Medicare HMO

## 2016-01-30 ENCOUNTER — Other Ambulatory Visit (HOSPITAL_COMMUNITY): Payer: Medicare HMO

## 2016-01-30 DIAGNOSIS — C257 Malignant neoplasm of other parts of pancreas: Secondary | ICD-10-CM

## 2016-01-30 DIAGNOSIS — J441 Chronic obstructive pulmonary disease with (acute) exacerbation: Secondary | ICD-10-CM

## 2016-01-30 DIAGNOSIS — E440 Moderate protein-calorie malnutrition: Secondary | ICD-10-CM

## 2016-01-30 LAB — BASIC METABOLIC PANEL
Anion gap: 8 (ref 5–15)
BUN: 35 mg/dL — AB (ref 6–20)
CO2: 24 mmol/L (ref 22–32)
CREATININE: 1.09 mg/dL — AB (ref 0.44–1.00)
Calcium: 8.6 mg/dL — ABNORMAL LOW (ref 8.9–10.3)
Chloride: 100 mmol/L — ABNORMAL LOW (ref 101–111)
GFR, EST AFRICAN AMERICAN: 59 mL/min — AB (ref >60)
GFR, EST NON AFRICAN AMERICAN: 51 mL/min — AB (ref >60)
Glucose, Bld: 138 mg/dL — ABNORMAL HIGH (ref 65–99)
Potassium: 4.7 mmol/L (ref 3.5–5.1)
SODIUM: 132 mmol/L — AB (ref 135–145)

## 2016-01-30 LAB — CBC
HCT: 25.6 % — ABNORMAL LOW (ref 36.0–46.0)
Hemoglobin: 8 g/dL — ABNORMAL LOW (ref 12.0–15.0)
MCH: 26.3 pg (ref 26.0–34.0)
MCHC: 31.3 g/dL (ref 30.0–36.0)
MCV: 84.2 fL (ref 78.0–100.0)
PLATELETS: 363 10*3/uL (ref 150–400)
RBC: 3.04 MIL/uL — AB (ref 3.87–5.11)
RDW: 21.7 % — AB (ref 11.5–15.5)
WBC: 13 10*3/uL — AB (ref 4.0–10.5)

## 2016-01-30 LAB — MRSA CULTURE: CULTURE: NOT DETECTED

## 2016-01-30 MED ORDER — IPRATROPIUM BROMIDE 0.02 % IN SOLN
0.5000 mg | RESPIRATORY_TRACT | Status: DC | PRN
  Administered 2016-01-30: 0.5 mg via RESPIRATORY_TRACT
  Filled 2016-01-30: qty 2.5

## 2016-01-30 MED ORDER — AMIODARONE HCL IN DEXTROSE 360-4.14 MG/200ML-% IV SOLN
INTRAVENOUS | Status: AC
  Filled 2016-01-30: qty 200

## 2016-01-30 MED ORDER — LEVALBUTEROL HCL 1.25 MG/0.5ML IN NEBU
1.2500 mg | INHALATION_SOLUTION | RESPIRATORY_TRACT | Status: DC | PRN
  Administered 2016-01-30 – 2016-02-01 (×3): 1.25 mg via RESPIRATORY_TRACT
  Filled 2016-01-30 (×5): qty 0.5

## 2016-01-30 MED ORDER — AMIODARONE LOAD VIA INFUSION
150.0000 mg | Freq: Once | INTRAVENOUS | Status: AC
  Administered 2016-01-30: 150 mg via INTRAVENOUS
  Filled 2016-01-30: qty 83.34

## 2016-01-30 NOTE — Progress Notes (Signed)
Patient ID: [name], female   DOB: July 15, 1950, 65 y.o.   MRN: [id number]  PROGRESS NOTE    Lindsay Salazar  [id number] DOB: May 09, 1950 DOA: 01/28/2016  PCP: Vesta Mixer   Brief Narrative:  65 year-old female with past medical history significant for COPD on home oxygen, atrial fibrillation, cancer, last infusion 01/24/2016 (paclitaxel and Gemzar). Patient presented to ED with shortness of breath and wheezing for past 24 hours prior to this admission. She was subsequently found to have rapid atrial fibrillation and cardiology has seen her in consultation.   Assessment & Plan:  Acute on chronic respiratory failure with hypoxia / acute COPD exacerbation -Patient hypoxic on the admission with oxygen saturation 87% on room air but with nasal cannula oxygen support it has improved to 93% - Chest x-ray showed COPD exacerbation without focal airspace disease - Continue oxygen support via nasal cannula to keep oxygen saturation above 90% - Continue Atrovent and Xopenex every 6 hours scheduled  - She is treated empirically with Levaquin - She was given Solu-Medrol on the admission but currently is on prednisone 40 mg a day  Tobacco abuse - Continue nicotine patch - Counseled on cessation  Anemia of chronic disease -Secondary to history of malignancy and sequela of chemotherapy - Hemoglobin 8 - No evidence of bleeding   Chronic kidney disease stage III - Recent baseline creatinine 1.27  - Creatinine on this admission with a baseline range   Atrial fibrillation with RVR.  - CHADS vasc score 3  - Likely secondary to hypoxia - Rate currently controlled with Cardizem 60 mg by mouth every 6 hours and amiodarone drip - Continue anticoagulation with xarelto  - Cardiology following, appreciate their input   Pancreatic adenocarcinoma - Last chemotherapy 01/24/2016 - Follows with Dr. Sheldon Silvan (sent message in EPIC that pt is in hospital)   Leukocytosis  - Likely from prednisone     Moderate protein calorie malnutrition  - In the context of chronic illness, malignancy  - Continue nutritional supplementation as recommended by nutritionist   Depression - Continue Lexapro   DVT prophylaxis: On Xarelto  Code Status: full Family Communication: family not at the bedside this am Disposition Plan: discharge home once stable from cardiology perspective    Consultants:   Cardiology  Procedures:   None   Antimicrobials:   Levaquin 01/29/2016 -->   Subjective: Feels short of breath after walking to the bathroom this am.  Objective: Vitals:   01/30/16 0600 01/30/16 0615 01/30/16 0700 01/30/16 0807  BP: 130/79 121/75 (!) 112/98   Pulse: 92 86 (!) 109   Resp: (!) 25 (!) 23 (!) 27   Temp:      TempSrc:      SpO2: 95% 94% 93% 92%  Weight:      Height:        Intake/Output Summary (Last 24 hours) at 01/30/16 0931 Last data filed at 01/30/16 0600  Gross per 24 hour  Intake           710.01 ml  Output                0 ml  Net           710.01 ml   Filed Weights   01/28/16 0843 01/29/16 0500 01/30/16 0500  Weight: 56.4 kg (124 lb 5.4 oz) 60.3 kg (132 lb 15 oz) 60.2 kg (132 lb 11.5 oz)    Examination:  General exam: Appears calm and comfortable  Respiratory  system: Coarse sounds, wheezing in upper lung lobes  Cardiovascular system: S1 & S2 heard, irregular rhythm, slightly tachycardic  Gastrointestinal system: Abdomen is nondistended, soft and nontender. No organomegaly or masses felt. Normal bowel sounds heard. Central nervous system: Alert and oriented. No focal neurological deficits. Extremities: Symmetric 5 x 5 power. Skin: No rashes, lesions or ulcers Psychiatry: Judgement and insight appear normal. Mood & affect appropriate.   Data Reviewed: I have personally reviewed following labs and imaging studies  CBC:  Recent Labs Lab 01/24/16 0958 01/28/16 0119 01/29/16 0442 01/30/16 0456  WBC 13.1* 9.7 10.4 13.0*  NEUTROABS 11.2*  7.7  --   --   HGB 8.5* 10.0* 8.4* 8.0*  HCT 27.0* 31.4* 26.9* 25.6*  MCV 85.2 84.2 84.3 84.2  PLT 454* 424* 392 [id number]   Basic Metabolic Panel:  Recent Labs Lab 01/24/16 0958 01/28/16 0119 01/28/16 0648 01/29/16 0442 01/30/16 0456  NA 138 137  --  134* 132*  K 3.9 4.9  --  4.4 4.7  CL 106 105  --  103 100*  CO2 24 25  --  22 24  GLUCOSE 127* 116*  --  241* 138*  BUN 24* 30*  --  27* 35*  CREATININE 0.96 0.82  --  1.12* 1.09*  CALCIUM 8.8* 8.6*  --  8.4* 8.6*  MG  --   --  2.9*  --   --   PHOS  --   --  3.8  --   --    GFR: Estimated Creatinine Clearance: 43.2 mL/min (by C-G formula based on SCr of 1.09 mg/dL (H)). Liver Function Tests:  Recent Labs Lab 01/24/16 0958 01/28/16 0119  AST 28 30  ALT 28 33  ALKPHOS 69 75  BILITOT 0.4 0.9  PROT 6.3* 6.4*  ALBUMIN 3.3* 3.4*   No results for input(s): LIPASE, AMYLASE in the last 168 hours. No results for input(s): AMMONIA in the last 168 hours. Coagulation Profile: No results for input(s): INR, PROTIME in the last 168 hours. Cardiac Enzymes:  Recent Labs Lab 01/28/16 0119 01/28/16 0648 01/28/16 1244  TROPONINI <0.03 <0.03 <0.03   BNP (last 3 results) No results for input(s): PROBNP in the last 8760 hours. HbA1C: No results for input(s): HGBA1C in the last 72 hours. CBG: No results for input(s): GLUCAP in the last 168 hours. Lipid Profile: No results for input(s): CHOL, HDL, LDLCALC, TRIG, CHOLHDL, LDLDIRECT in the last 72 hours. Thyroid Function Tests: No results for input(s): TSH, T4TOTAL, FREET4, T3FREE, THYROIDAB in the last 72 hours. Anemia Panel: No results for input(s): VITAMINB12, FOLATE, FERRITIN, TIBC, IRON, RETICCTPCT in the last 72 hours. Urine analysis:    Component Value Date/Time   COLORURINE YELLOW 12/24/2015 1612   APPEARANCEUR CLEAR 12/24/2015 1612   LABSPEC 1.025 12/24/2015 1612   PHURINE 6.0 12/24/2015 1612   GLUCOSEU NEGATIVE 12/24/2015 1612   HGBUR MODERATE (A) 12/24/2015 1612    BILIRUBINUR NEGATIVE 12/24/2015 1612   KETONESUR NEGATIVE 12/24/2015 1612   PROTEINUR NEGATIVE 12/24/2015 1612   NITRITE NEGATIVE 12/24/2015 1612   LEUKOCYTESUR SMALL (A) 12/24/2015 1612   Sepsis Labs: @LABRCNTIP (procalcitonin:4,lacticidven:4)    Recent Results (from the past 240 hour(s))  MRSA PCR Screening     Status: Abnormal   Collection Time: 01/28/16  6:53 AM  Result Value Ref Range Status   MRSA by PCR INVALID RESULTS, SPECIMEN SENT FOR CULTURE (A) NEGATIVE Final  MRSA culture     Status: None (Preliminary result)   Collection Time: 01/28/16  6:53 AM  Result Value Ref Range Status   Specimen Description NOSE  Final   Special Requests NONE  Final   Culture   Final    CULTURE REINCUBATED FOR BETTER GROWTH Performed at [facility]    Report Status PENDING  Incomplete  Rapid strep screen (not at [facility])     Status: None   Collection Time: 01/29/16 10:38 AM  Result Value Ref Range Status   Streptococcus, Group A Screen (Direct) NEGATIVE NEGATIVE Final      Radiology Studies: Dg Chest Port 1 View Result Date: 01/28/2016 COPD without focal airspace disease.    Scheduled Meds: . diltiazem  60 mg Oral Q6H  . escitalopram  10 mg Oral QPM  . feeding supplement (ENSURE ENLIVE)  237 mL Oral BID BM  . HYDROcodone-acetaminophen  1 tablet Oral Once  . ipratropium  0.5 mg Nebulization Q6H  . levalbuterol  1.25 mg Nebulization Q6H  . levofloxacin  750 mg Oral Q48H  . nicotine  14 mg Transdermal Daily  . pantoprazole  40 mg Oral Daily  . predniSONE  40 mg Oral Q supper  . rivaroxaban  20 mg Oral Q supper   Continuous Infusions: . amiodarone 30 mg/hr (01/30/16 0238)     LOS: 2 days    Time spent: 25 minutes  Greater than 50% of the time spent on counseling and coordinating the care.   Leisa Lenz, MD [facility] Pager [phone]  If 7PM-7AM, please contact night-coverage [email or web address] Password [facility] 01/30/2016, 9:31 AM

## 2016-01-30 NOTE — Progress Notes (Signed)
Patient ID: Lindsay Salazar, female   DOB: Feb 29, 1950, 65 y.o.   MRN: [id number]     Primary cardiologist:  Subjective:    Some SOB this AM. No palpitations.   Objective:   Temp:  [97.8 F (36.6 C)-98.7 F (37.1 C)] 98.5 F (36.9 C) (12/27 0400) Pulse Rate:  [80-130] 109 (12/27 0700) Resp:  [20-42] 27 (12/27 0700) BP: (89-144)/(55-116) 112/98 (12/27 0700) SpO2:  [87 %-100 %] 92 % (12/27 0807) Weight:  [132 lb 11.5 oz (60.2 kg)] 132 lb 11.5 oz (60.2 kg) (12/27 0500) Last BM Date: 01/28/16  Filed Weights   01/28/16 0843 01/29/16 0500 01/30/16 0500  Weight: 124 lb 5.4 oz (56.4 kg) 132 lb 15 oz (60.3 kg) 132 lb 11.5 oz (60.2 kg)    Intake/Output Summary (Last 24 hours) at 01/30/16 0837 Last data filed at 01/30/16 0600  Gross per 24 hour  Intake           710.01 ml  Output                0 ml  Net           710.01 ml    Telemetry: afib rates 70-100s  Exam:  General: NAD  HEENT: sclera clear, throat clear  Resp: bilateral wheezing.   Cardiac: irreg, rate 90, no m/r/g, no jvd  GI: abdomen soft, NT, ND  MSK: no LE edema  Neuro: no focal deficits  Psych: appropriate affect  Lab Results:  Basic Metabolic Panel:  Recent Labs Lab 01/28/16 0119 01/28/16 0648 01/29/16 0442 01/30/16 0456  NA 137  --  134* 132*  K 4.9  --  4.4 4.7  CL 105  --  103 100*  CO2 25  --  22 24  GLUCOSE 116*  --  241* 138*  BUN 30*  --  27* 35*  CREATININE 0.82  --  1.12* 1.09*  CALCIUM 8.6*  --  8.4* 8.6*  MG  --  2.9*  --   --     Liver Function Tests:  Recent Labs Lab 01/24/16 0958 01/28/16 0119  AST 28 30  ALT 28 33  ALKPHOS 69 75  BILITOT 0.4 0.9  PROT 6.3* 6.4*  ALBUMIN 3.3* 3.4*    CBC:  Recent Labs Lab 01/28/16 0119 01/29/16 0442 01/30/16 0456  WBC 9.7 10.4 13.0*  HGB 10.0* 8.4* 8.0*  HCT 31.4* 26.9* 25.6*  MCV 84.2 84.3 84.2  PLT 424* 392 363    Cardiac Enzymes:  Recent Labs Lab 01/28/16 0119 01/28/16 0648 01/28/16 1244  TROPONINI <0.03 <0.03  <0.03    BNP: No results for input(s): PROBNP in the last 8760 hours.  Coagulation: No results for input(s): INR in the last 168 hours.  ECG:   Medications:   Scheduled Medications: . diltiazem  60 mg Oral Q6H  . escitalopram  10 mg Oral QPM  . feeding supplement (ENSURE ENLIVE)  237 mL Oral BID BM  . HYDROcodone-acetaminophen  1 tablet Oral Once  . ipratropium  0.5 mg Nebulization Q6H  . levalbuterol  1.25 mg Nebulization Q6H  . levofloxacin  750 mg Oral Q48H  . nicotine  14 mg Transdermal Daily  . pantoprazole  40 mg Oral Daily  . predniSONE  40 mg Oral Q supper  . rivaroxaban  20 mg Oral Q supper     Infusions: . amiodarone 30 mg/hr (01/30/16 0238)     PRN Medications:  furosemide, guaiFENesin, HYDROcodone-acetaminophen, ipratropium, levalbuterol  Assessment/Plan    1. Afib with RVR - history of afib, current afib with RVR  exacerbated by her COPD exacerbation - home regimen lopressor 37.5mg  bid, dilt 240 daily.  - currently on dilt gtt, dilt 60 po q 6hrs, lopressor 37.5mg  bid.  - CHADS2Vasc score is 3, she is on xarelto  - yesterday remained tachcyardic, soft bp's. We started her on amiodarone IV. Likely will require only a short duration of therapy until her acute respiratory issues have resolved and could get back to her previous rate control strategy.  - consolidate diltiazem to long acting once rate controlled.  - rates improved but remains in afib, we will bolus additional 150mg  of amidoarone and then continue drip at 30mg /hr.     2. COPD exacerbation - per primary team.     [name], M.D.

## 2016-01-31 ENCOUNTER — Inpatient Hospital Stay (HOSPITAL_COMMUNITY): Payer: Medicare HMO

## 2016-01-31 DIAGNOSIS — J449 Chronic obstructive pulmonary disease, unspecified: Secondary | ICD-10-CM

## 2016-01-31 LAB — CBC
HCT: 27.4 % — ABNORMAL LOW (ref 36.0–46.0)
HEMOGLOBIN: 8.7 g/dL — AB (ref 12.0–15.0)
MCH: 26.7 pg (ref 26.0–34.0)
MCHC: 31.8 g/dL (ref 30.0–36.0)
MCV: 84 fL (ref 78.0–100.0)
PLATELETS: 323 10*3/uL (ref 150–400)
RBC: 3.26 MIL/uL — AB (ref 3.87–5.11)
RDW: 22.1 % — ABNORMAL HIGH (ref 11.5–15.5)
WBC: 13.7 10*3/uL — ABNORMAL HIGH (ref 4.0–10.5)

## 2016-01-31 LAB — CULTURE, GROUP A STREP (THRC)

## 2016-01-31 MED ORDER — DILTIAZEM HCL 60 MG PO TABS
90.0000 mg | ORAL_TABLET | Freq: Four times a day (QID) | ORAL | Status: DC
  Administered 2016-01-31 – 2016-02-04 (×16): 90 mg via ORAL
  Filled 2016-01-31 (×15): qty 1

## 2016-01-31 MED ORDER — AMIODARONE HCL 200 MG PO TABS
200.0000 mg | ORAL_TABLET | Freq: Every day | ORAL | Status: DC
  Administered 2016-01-31 – 2016-02-01 (×2): 200 mg via ORAL
  Filled 2016-01-31 (×2): qty 1

## 2016-01-31 NOTE — Progress Notes (Signed)
Patient Name: Lindsay Salazar Date of Encounter: 01/31/2016  Primary Cardiologist: [name] MD  Hospital Problem List     Principal Problem:   Atrial fibrillation with rapid ventricular response [facility]) Active Problems:   Anemia   Pancreatic cancer ([place])   COPD with acute exacerbation (HCC)   Hypertension   Anxiety   Chronic respiratory failure with hypoxia (HCC)   Malnutrition of moderate degree     Subjective   I feel "worn out:" Breathing remains tenuous, but she denies feelings of dyspnea.   Inpatient Medications    Scheduled Meds: . diltiazem  60 mg Oral Q6H  . escitalopram  10 mg Oral QPM  . feeding supplement (ENSURE ENLIVE)  237 mL Oral BID BM  . HYDROcodone-acetaminophen  1 tablet Oral Once  . ipratropium  0.5 mg Nebulization Q6H  . levalbuterol  1.25 mg Nebulization Q6H  . levofloxacin  750 mg Oral Q48H  . nicotine  14 mg Transdermal Daily  . pantoprazole  40 mg Oral Daily  . predniSONE  40 mg Oral Q supper  . rivaroxaban  20 mg Oral Q supper   Continuous Infusions: . amiodarone 30 mg/hr (01/31/16 0600)   PRN Meds: furosemide, guaiFENesin, HYDROcodone-acetaminophen, ipratropium, levalbuterol   Vital Signs    Vitals:   01/31/16 0500 01/31/16 0600 01/31/16 0716 01/31/16 0809  BP: (!) 143/91 130/87    Pulse: 100 (!) 108 (!) 101   Resp: (!) 22 (!) 24 19   Temp:   97.9 F (36.6 C)   TempSrc:   Oral   SpO2: 93% 94% 94% 91%  Weight: 134 lb 7.7 oz (61 kg)     Height:        Intake/Output Summary (Last 24 hours) at 01/31/16 0913 Last data filed at 01/31/16 0814  Gross per 24 hour  Intake           1143.7 ml  Output                0 ml  Net           1143.7 ml   Filed Weights   01/29/16 0500 01/30/16 0500 01/31/16 0500  Weight: 132 lb 15 oz (60.3 kg) 132 lb 11.5 oz (60.2 kg) 134 lb 7.7 oz (61 kg)    Physical Exam    GEN: Well nourished, well developed, in no acute distress.  HEENT: Grossly normal.  Neck: Supple, no JVD, carotid bruits,  or masses. Cardiac: IRRR, no murmurs, rubs, or gallops. No clubbing, cyanosis, edema.  Radials/DP/PT 2+ and equal bilaterally.  Respiratory:  Bilateral wheezing, inspiratory and expiratory. Occasional coughing.  GI: Soft, nontender, nondistended, BS + x 4. MS: no deformity or atrophy. Skin: warm and dry, no rash. Neuro:  Strength and sensation are intact. Psych: AAOx3.  Normal affect.  Labs    CBC  Recent Labs  01/29/16 0442 01/30/16 0456  WBC 10.4 13.0*  HGB 8.4* 8.0*  HCT 26.9* 25.6*  MCV 84.3 84.2  PLT 392 [id number]   Basic Metabolic Panel  Recent Labs  01/29/16 0442 01/30/16 0456  NA 134* 132*  K 4.4 4.7  CL 103 100*  CO2 22 24  GLUCOSE 241* 138*  BUN 27* 35*  CREATININE 1.12* 1.09*  CALCIUM 8.4* 8.6*   Cardiac Enzymes  Recent Labs  01/28/16 1244  TROPONINI <0.03     Telemetry    Atrial flutter, rates in the low 100's.  - Personally Reviewed  Radiology    No  results found.  Cardiac Studies   Echocardiogram 06/28/2015 Left ventricle: The cavity size was normal. Wall thickness was   normal. Systolic function was normal. The estimated ejection   fraction was 55%. There is akinesis of the midanteroseptal   myocardium. The study is not technically sufficient to allow   evaluation of LV diastolic function. - Aortic valve: Mildly calcified annulus. Trileaflet. There was   trivial regurgitation. - Mitral valve: Mildly thickened leaflets . There was mild   regurgitation. - Left atrium: The atrium was mildly dilated. - Right ventricle: Systolic function was low normal. - Right atrium: The atrium was mildly dilated. Central venous   pressure (est): 8 mm Hg. - Tricuspid valve: There was moderate regurgitation. - Pulmonary arteries: PA peak pressure: 41 mm Hg (S). - Pericardium, extracardiac: There was no pericardial effusion.   Patient Profile     Lindsay Salazar is a 65 y.o.female history of COPD, PAF s/p DCCV June 2017, prevouis GI bleed related to heavy  NSAID use, pancreatic cancer,HTN admitted with SOB and productive cough. Being treated for COPD exacerbation, cardiology is consulted for afib with RVR.  Assessment & Plan    1.Atrial flutter: Heart rate is still elevated. Required IV bolus of amiodarone and is now on 30 mg/hour. Due to shortage of amiodarone IV will transition to po amiodarone 200 mg daily. Due to wheezing would not increase to BID. Will increase the diltiazem to 90 mg Q 6 hours. D/C Atrovent, continue Xopenex. Continue Xarelto.   2.COPD: Exacerbation, treatment with steroids and inhalers. Due to active wheezing will not place on BID dosing of amiodarone. Check CXR for CHF. No evidence of edema, but has significant wheezing with know COPD. She is on prn lasix.Consider giving one dose today.   3.Anemia: Hgb down from 10.0 to 8.0  Check hemoccult due to use of Xarelto.  3.Chronic Pain: Will need control to keep HR down.  Signed, Jory Sims, NP  01/31/2016, 9:13 AM   Pt seen and examined  I agree with findings as noted by Arnold Long above  Pt says breathing is worse this AM   On exam;  Lungs  Moving air  Diffuse wheezes  Cardiac exam;  Irreg irreg  NO S3  Ext without edema   Caryl Never with po amio  And with increase of diltiazem  BP should tolerate  Will follow  Continue steriods and inhalers. Follow CBC later today   Dorris Carnes

## 2016-01-31 NOTE — Progress Notes (Deleted)
Placed patient on 3L O2 for SATs of 99%, patient HR 88 and RR 20. Patient appears to be stable, RT will continue to monitor.

## 2016-01-31 NOTE — Progress Notes (Signed)
Patient ID: [name], female   DOB: 18-Jul-1950, 65 y.o.   MRN: [id number]  PROGRESS NOTE    ADRI MELCHOR  [id number] DOB: [birth year] DOA: 01/28/2016  PCP: Vesta Mixer   Brief Narrative:  65 year-old female with past medical history significant for COPD on home oxygen, atrial fibrillation, cancer, last infusion 01/24/2016 (paclitaxel and Gemzar). Patient presented to ED with shortness of breath and wheezing for past 24 hours prior to this admission. She was subsequently found to have rapid atrial fibrillation and cardiology has seen her in consultation.   Assessment & Plan:  Acute on chronic respiratory failure with hypoxia / acute COPD exacerbation -Patient was hypoxic on the admission with oxygen saturation 87% on room air but with nasal cannula oxygen support it has improved to 93% - Chest x-ray showed COPD exacerbation without focal airspace disease - She is still wheezing on lung exam - We will continue oxygen support via nasal cannula to keep oxygen saturation above 90% - We will continue Atrovent and Xopenex every 6 hours scheduled  - Continue Levaquin  - Continue prednisone  - She was given Solu-Medrol on the admission   Tobacco abuse - Continue nicotine patch - Counseled on cessation  Anemia of chronic disease -Secondary to history of malignancy and sequela of chemotherapy - Hemoglobin 8 - Follow up CBC in am  Chronic kidney disease stage III - Recent baseline creatinine 1.27  - Creatinine on this admission with a baseline range   Atrial fibrillation with RVR.  - CHADS vasc score 3  - Likely secondary to hypoxia - Rate controlled with Cardizem 60 mg by mouth every 6 hours and amiodarone drip - Continue xarelto  - Appreciate cardio following   Pancreatic adenocarcinoma - Last chemotherapy 01/24/2016 - Follows with Dr. Sheldon Silvan, Aware of pt admission   Leukocytosis  - Likely from prednisone   Moderate protein calorie malnutrition  - In the  context of chronic illness, malignancy  - Continue nutritional supplementation as recommended by nutritionist   Depression - Continue Lexapro   DVT prophylaxis: On Xarelto  Code Status: full Family Communication: family not at the bedside this am Disposition Plan: discharge home once respiratory statu stable    Consultants:   Cardiology  Procedures:   None   Antimicrobials:   Levaquin 01/29/2016 -->   Subjective: Still short of breath this am.  Objective: Vitals:   01/31/16 0500 01/31/16 0600 01/31/16 0716 01/31/16 0809  BP: (!) 143/91 130/87    Pulse: 100 (!) 108 (!) 101   Resp: (!) 22 (!) 24 19   Temp:   97.9 F (36.6 C)   TempSrc:   Oral   SpO2: 93% 94% 94% 91%  Weight: 61 kg (134 lb 7.7 oz)     Height:        Intake/Output Summary (Last 24 hours) at 01/31/16 0818 Last data filed at 01/31/16 0814  Gross per 24 hour  Intake           1143.7 ml  Output                0 ml  Net           1143.7 ml   Filed Weights   01/29/16 0500 01/30/16 0500 01/31/16 0500  Weight: 60.3 kg (132 lb 15 oz) 60.2 kg (132 lb 11.5 oz) 61 kg (134 lb 7.7 oz)    Examination:  General exam: Appears calm and comfortable, no distress but is short  of breath with exerting  Respiratory system: Rhonchorous, wheezing in upper lung lobes  Cardiovascular system: S1 & S2 heard, irregular rhythm, tachycardic  Gastrointestinal system: (+) BS, non tender abdomen  Central nervous system: No focal neurological deficits. Extremities: Symmetric 5 x 5 power. No edema Skin: No rashes, lesions or ulcers, skin is warm and dry  Psychiatry: Mood & affect appropriate.   Data Reviewed: I have personally reviewed following labs and imaging studies  CBC:  Recent Labs Lab 01/24/16 0958 01/28/16 0119 01/29/16 0442 01/30/16 0456  WBC 13.1* 9.7 10.4 13.0*  NEUTROABS 11.2* 7.7  --   --   HGB 8.5* 10.0* 8.4* 8.0*  HCT 27.0* 31.4* 26.9* 25.6*  MCV 85.2 84.2 84.3 84.2  PLT 454* 424* 392 [id number]    Basic Metabolic Panel:  Recent Labs Lab 01/24/16 0958 01/28/16 0119 01/28/16 0648 01/29/16 0442 01/30/16 0456  NA 138 137  --  134* 132*  K 3.9 4.9  --  4.4 4.7  CL 106 105  --  103 100*  CO2 24 25  --  22 24  GLUCOSE 127* 116*  --  241* 138*  BUN 24* 30*  --  27* 35*  CREATININE 0.96 0.82  --  1.12* 1.09*  CALCIUM 8.8* 8.6*  --  8.4* 8.6*  MG  --   --  2.9*  --   --   PHOS  --   --  3.8  --   --    GFR: Estimated Creatinine Clearance: 43.2 mL/min (by C-G formula based on SCr of 1.09 mg/dL (H)). Liver Function Tests:  Recent Labs Lab 01/24/16 0958 01/28/16 0119  AST 28 30  ALT 28 33  ALKPHOS 69 75  BILITOT 0.4 0.9  PROT 6.3* 6.4*  ALBUMIN 3.3* 3.4*   No results for input(s): LIPASE, AMYLASE in the last 168 hours. No results for input(s): AMMONIA in the last 168 hours. Coagulation Profile: No results for input(s): INR, PROTIME in the last 168 hours. Cardiac Enzymes:  Recent Labs Lab 01/28/16 0119 01/28/16 0648 01/28/16 1244  TROPONINI <0.03 <0.03 <0.03   BNP (last 3 results) No results for input(s): PROBNP in the last 8760 hours. HbA1C: No results for input(s): HGBA1C in the last 72 hours. CBG: No results for input(s): GLUCAP in the last 168 hours. Lipid Profile: No results for input(s): CHOL, HDL, LDLCALC, TRIG, CHOLHDL, LDLDIRECT in the last 72 hours. Thyroid Function Tests: No results for input(s): TSH, T4TOTAL, FREET4, T3FREE, THYROIDAB in the last 72 hours. Anemia Panel: No results for input(s): VITAMINB12, FOLATE, FERRITIN, TIBC, IRON, RETICCTPCT in the last 72 hours. Urine analysis:    Component Value Date/Time   COLORURINE YELLOW 12/24/2015 1612   APPEARANCEUR CLEAR 12/24/2015 1612   LABSPEC 1.025 12/24/2015 1612   PHURINE 6.0 12/24/2015 1612   GLUCOSEU NEGATIVE 12/24/2015 1612   HGBUR MODERATE (A) 12/24/2015 1612   BILIRUBINUR NEGATIVE 12/24/2015 1612   KETONESUR NEGATIVE 12/24/2015 1612   PROTEINUR NEGATIVE 12/24/2015 1612   NITRITE  NEGATIVE 12/24/2015 1612   LEUKOCYTESUR SMALL (A) 12/24/2015 1612   Sepsis Labs: @LABRCNTIP (procalcitonin:4,lacticidven:4)    Recent Results (from the past 240 hour(s))  MRSA PCR Screening     Status: Abnormal   Collection Time: 01/28/16  6:53 AM  Result Value Ref Range Status   MRSA by PCR INVALID RESULTS, SPECIMEN SENT FOR CULTURE (A) NEGATIVE Final  MRSA culture     Status: None (Preliminary result)   Collection Time: 01/28/16  6:53 AM  Result Value  Ref Range Status   Specimen Description NOSE  Final   Special Requests NONE  Final   Culture   Final    CULTURE REINCUBATED FOR BETTER GROWTH Performed at [facility]    Report Status PENDING  Incomplete  Rapid strep screen (not at [facility])     Status: None   Collection Time: 01/29/16 10:38 AM  Result Value Ref Range Status   Streptococcus, Group A Screen (Direct) NEGATIVE NEGATIVE Final      Radiology Studies: Dg Chest Port 1 View Result Date: 01/28/2016 COPD without focal airspace disease.    Scheduled Meds: . diltiazem  60 mg Oral Q6H  . escitalopram  10 mg Oral QPM  . feeding supplement (ENSURE ENLIVE)  237 mL Oral BID BM  . HYDROcodone-acetaminophen  1 tablet Oral Once  . ipratropium  0.5 mg Nebulization Q6H  . levalbuterol  1.25 mg Nebulization Q6H  . levofloxacin  750 mg Oral Q48H  . nicotine  14 mg Transdermal Daily  . pantoprazole  40 mg Oral Daily  . predniSONE  40 mg Oral Q supper  . rivaroxaban  20 mg Oral Q supper   Continuous Infusions: . amiodarone 30 mg/hr (01/31/16 0600)     LOS: 3 days    Time spent: 25 minutes  Greater than 50% of the time spent on counseling and coordinating the care.   Leisa Lenz, MD [facility] Pager [phone]  If 7PM-7AM, please contact night-coverage [email or web address] Password [facility] 01/31/2016, 8:18 AM

## 2016-02-01 ENCOUNTER — Ambulatory Visit (HOSPITAL_COMMUNITY): Payer: Medicare HMO

## 2016-02-01 LAB — CBC
HCT: 26.5 % — ABNORMAL LOW (ref 36.0–46.0)
Hemoglobin: 8.1 g/dL — ABNORMAL LOW (ref 12.0–15.0)
MCH: 25.7 pg — AB (ref 26.0–34.0)
MCHC: 30.6 g/dL (ref 30.0–36.0)
MCV: 84.1 fL (ref 78.0–100.0)
PLATELETS: 273 10*3/uL (ref 150–400)
RBC: 3.15 MIL/uL — ABNORMAL LOW (ref 3.87–5.11)
RDW: 22.1 % — ABNORMAL HIGH (ref 11.5–15.5)
WBC: 11 10*3/uL — ABNORMAL HIGH (ref 4.0–10.5)

## 2016-02-01 LAB — BASIC METABOLIC PANEL
Anion gap: 9 (ref 5–15)
BUN: 30 mg/dL — AB (ref 6–20)
CHLORIDE: 99 mmol/L — AB (ref 101–111)
CO2: 26 mmol/L (ref 22–32)
CREATININE: 0.96 mg/dL (ref 0.44–1.00)
Calcium: 8.4 mg/dL — ABNORMAL LOW (ref 8.9–10.3)
GFR calc Af Amer: >60 mL/min (ref >60)
GFR, EST NON AFRICAN AMERICAN: 60 mL/min — AB (ref >60)
GLUCOSE: 138 mg/dL — AB (ref 65–99)
Potassium: 4.7 mmol/L (ref 3.5–5.1)
SODIUM: 134 mmol/L — AB (ref 135–145)

## 2016-02-01 MED ORDER — LEVOFLOXACIN 750 MG PO TABS
750.0000 mg | ORAL_TABLET | Freq: Every day | ORAL | Status: DC
  Administered 2016-02-01 – 2016-02-02 (×2): 750 mg via ORAL
  Filled 2016-02-01 (×2): qty 1

## 2016-02-01 NOTE — Progress Notes (Signed)
Patient assisted up to bedside commode and heart rate increased to 120s-130 with initial activity. Heart rate returned to 90s-105 range with rest. Assisted up to chair, tolerated fairly well. Some dyspnea with activity and increased heart rate. Pt states comfortable in chair, personal items and call light within reach. Instructed to call for assistance and not attempt getting up on her own. Verbalizes understanding. Donavan Foil, RN

## 2016-02-01 NOTE — Care Management Important Message (Signed)
Important Message  Patient Details  Name: Lindsay Salazar MRN: [id number] Date of Birth: 1950/04/25   Medicare Important Message Given:  Yes    Domique Reardon, Chauncey Reading, RN 02/01/2016, 4:37 PM

## 2016-02-01 NOTE — Progress Notes (Signed)
Patient ID: Lindsay Salazar, female   DOB: [birth year], 65 y.o.   MRN: [id number]  PROGRESS NOTE    Lindsay Salazar  [id number] DOB: 1950-08-08 DOA: 01/28/2016  PCP: Vesta Mixer   Brief Narrative:  65 year-old female with past medical history significant for COPD on home oxygen, atrial fibrillation, cancer, last infusion 01/24/2016 (paclitaxel and Gemzar). Patient presented to ED with shortness of breath and wheezing for past 24 hours prior to this admission. She was subsequently found to have rapid atrial fibrillation and cardiology has seen her in consultation.   Assessment & Plan:  Acute on chronic respiratory failure with hypoxia / acute COPD exacerbation - Patient was hypoxic on the admission with oxygen saturation 87% on room air but with nasal cannula oxygen support it has improved to 93% - Chest x-ray showed COPD exacerbation without focal airspace disease - Continue oxygen support via nasal cannula to keep oxygen saturation above 90% - Continue Atrovent and Xopenex every 6 hours scheduled and xopenex every 4 hours PRN shortness of breath or wheezing  - Continue Levaquin treating empirically for community-acquired pneumonia,  - Continue prednisone   Tobacco abuse - Continue nicotine patch - Counseled on cessation  Anemia of chronic disease -Secondary to history of malignancy and sequela of chemotherapy - Hemoglobin 8.7, stable   Chronic kidney disease stage III - Recent baseline creatinine 1.27  - Creatinine on this admission with a baseline range   Atrial fibrillation with RVR.  - CHADS vasc score 3  - Likely secondary to hypoxia - Rate controlled with Cardizem 60 mg by mouth every 6 hours and amiodarone drip. Amiodarone drip stopped today by cardiology  - Continue xarelto   Pancreatic adenocarcinoma - Last chemotherapy 01/24/2016 - Follows with Dr. Sheldon Silvan, Aware of pt admission   Leukocytosis  - Likely from prednisone   Moderate protein calorie  malnutrition  - In the context of chronic illness, malignancy  - Continue nutritional supplementation as recommended by nutritionist   Depression - Continue Lexapro   DVT prophylaxis: On Xarelto  Code Status: full Family Communication: family not at the bedside this am Disposition Plan: discharge home once cleared by cardio    Consultants:   Cardiology  Procedures:   None   Antimicrobials:   Levaquin 01/29/2016 -->   Subjective: Feels short of breath this morning. No acute overnight events.  Objective: Vitals:   02/01/16 0552 02/01/16 0756 02/01/16 0847 02/01/16 0944  BP: 140/75  109/78   Pulse:   86   Resp:      Temp:  98.4 F (36.9 C)    TempSrc:  Oral    SpO2:    96%  Weight:      Height:       No intake or output data in the 24 hours ending 02/01/16 1142 Filed Weights   01/30/16 0500 01/31/16 0500 02/01/16 0500  Weight: 60.2 kg (132 lb 11.5 oz) 61 kg (134 lb 7.7 oz) 61 kg (134 lb 7.7 oz)    Examination:  General exam: No acute distress Respiratory system: Rhonchorous, wheezing in upper lung lobes  Cardiovascular system: S1 & S2 heard, irregular rhythm, tachycardic  Gastrointestinal system: (+) BS, non-tender, non-distended Central nervous system: Nonfocal Extremities: No lower extremity edema, pulses palpable Skin: Skin is warm and dry Psychiatry: Mood & affect appropriate. Not agitated or restless  Data Reviewed: I have personally reviewed following labs and imaging studies  CBC:  Recent Labs Lab 01/28/16 0119 01/29/16 0442 01/30/16 0456  01/31/16 1616 02/01/16 0451  WBC 9.7 10.4 13.0* 13.7* 11.0*  NEUTROABS 7.7  --   --   --   --   HGB 10.0* 8.4* 8.0* 8.7* 8.1*  HCT 31.4* 26.9* 25.6* 27.4* 26.5*  MCV 84.2 84.3 84.2 84.0 84.1  PLT 424* 392 363 [id number]   Basic Metabolic Panel:  Recent Labs Lab 01/28/16 0119 01/28/16 0648 01/29/16 0442 01/30/16 0456 02/01/16 0451  NA 137  --  134* 132* 134*  K 4.9  --  4.4 4.7 4.7  CL  105  --  103 100* 99*  CO2 25  --  22 24 26   GLUCOSE 116*  --  241* 138* 138*  BUN 30*  --  27* 35* 30*  CREATININE 0.82  --  1.12* 1.09* 0.96  CALCIUM 8.6*  --  8.4* 8.6* 8.4*  MG  --  2.9*  --   --   --   PHOS  --  3.8  --   --   --    GFR: Estimated Creatinine Clearance: 49.1 mL/min (by C-G formula based on SCr of 0.96 mg/dL). Liver Function Tests:  Recent Labs Lab 01/28/16 0119  AST 30  ALT 33  ALKPHOS 75  BILITOT 0.9  PROT 6.4*  ALBUMIN 3.4*   No results for input(s): LIPASE, AMYLASE in the last 168 hours. No results for input(s): AMMONIA in the last 168 hours. Coagulation Profile: No results for input(s): INR, PROTIME in the last 168 hours. Cardiac Enzymes:  Recent Labs Lab 01/28/16 0119 01/28/16 0648 01/28/16 1244  TROPONINI <0.03 <0.03 <0.03   BNP (last 3 results) No results for input(s): PROBNP in the last 8760 hours. HbA1C: No results for input(s): HGBA1C in the last 72 hours. CBG: No results for input(s): GLUCAP in the last 168 hours. Lipid Profile: No results for input(s): CHOL, HDL, LDLCALC, TRIG, CHOLHDL, LDLDIRECT in the last 72 hours. Thyroid Function Tests: No results for input(s): TSH, T4TOTAL, FREET4, T3FREE, THYROIDAB in the last 72 hours. Anemia Panel: No results for input(s): VITAMINB12, FOLATE, FERRITIN, TIBC, IRON, RETICCTPCT in the last 72 hours. Urine analysis:    Component Value Date/Time   COLORURINE YELLOW 12/24/2015 1612   APPEARANCEUR CLEAR 12/24/2015 1612   LABSPEC 1.025 12/24/2015 1612   PHURINE 6.0 12/24/2015 1612   GLUCOSEU NEGATIVE 12/24/2015 1612   HGBUR MODERATE (A) 12/24/2015 1612   BILIRUBINUR NEGATIVE 12/24/2015 1612   KETONESUR NEGATIVE 12/24/2015 1612   PROTEINUR NEGATIVE 12/24/2015 1612   NITRITE NEGATIVE 12/24/2015 1612   LEUKOCYTESUR SMALL (A) 12/24/2015 1612   Sepsis Labs: @LABRCNTIP (procalcitonin:4,lacticidven:4)    Recent Results (from the past 240 hour(s))  MRSA PCR Screening     Status: Abnormal    Collection Time: 01/28/16  6:53 AM  Result Value Ref Range Status   MRSA by PCR INVALID RESULTS, SPECIMEN SENT FOR CULTURE (A) NEGATIVE Final  MRSA culture     Status: None (Preliminary result)   Collection Time: 01/28/16  6:53 AM  Result Value Ref Range Status   Specimen Description NOSE  Final   Special Requests NONE  Final   Culture   Final    CULTURE REINCUBATED FOR BETTER GROWTH Performed at [facility]    Report Status PENDING  Incomplete  Rapid strep screen (not at [facility])     Status: None   Collection Time: 01/29/16 10:38 AM  Result Value Ref Range Status   Streptococcus, Group A Screen (Direct) NEGATIVE NEGATIVE Final      Radiology  Studies: Dg Chest Port 1 View Result Date: 01/28/2016 COPD without focal airspace disease.    Scheduled Meds: . diltiazem  90 mg Oral Q6H  . escitalopram  10 mg Oral QPM  . feeding supplement (ENSURE ENLIVE)  237 mL Oral BID BM  . HYDROcodone-acetaminophen  1 tablet Oral Once  . ipratropium  0.5 mg Nebulization Q6H  . levalbuterol  1.25 mg Nebulization Q6H  . levofloxacin  750 mg Oral Daily  . nicotine  14 mg Transdermal Daily  . pantoprazole  40 mg Oral Daily  . predniSONE  40 mg Oral Q supper  . rivaroxaban  20 mg Oral Q supper   Continuous Infusions:    LOS: 4 days    Time spent: 25 minutes  Greater than 50% of the time spent on counseling and coordinating the care.   Leisa Lenz, MD [facility] Pager [phone]  If 7PM-7AM, please contact night-coverage [email or web address] Password [facility] 02/01/2016, 11:42 AM

## 2016-02-01 NOTE — Progress Notes (Signed)
Offered to assist patient up to chair this morning. Stated "not right now I've just had my treatment and my breathing is okay. Maybe after lunch." Will monitor and assist up to chair after lunch if patient agreeable. Donavan Foil, RN

## 2016-02-01 NOTE — Progress Notes (Signed)
Patient Name: Lindsay Salazar [facility] Date of Encounter: 02/01/2016  Primary Cardiologist: [name] MD  Hospital Problem List     Principal Problem:   Atrial fibrillation with rapid ventricular response [facility]) Active Problems:   Anemia   Pancreatic cancer ([place])   COPD with acute exacerbation ([place])   Hypertension   Anxiety   Chronic respiratory failure with hypoxia ([facility])   Malnutrition of moderate degree     Subjective   Easily dyspneic with minimal exertion. Heart rate is better, she feels about the same. Complaining of hip pain.   Inpatient Medications    Scheduled Meds: . amiodarone  200 mg Oral Daily  . diltiazem  90 mg Oral Q6H  . escitalopram  10 mg Oral QPM  . feeding supplement (ENSURE ENLIVE)  237 mL Oral BID BM  . HYDROcodone-acetaminophen  1 tablet Oral Once  . ipratropium  0.5 mg Nebulization Q6H  . levalbuterol  1.25 mg Nebulization Q6H  . levofloxacin  750 mg Oral Daily  . nicotine  14 mg Transdermal Daily  . pantoprazole  40 mg Oral Daily  . predniSONE  40 mg Oral Q supper  . rivaroxaban  20 mg Oral Q supper   Continuous Infusions:  PRN Meds: furosemide, guaiFENesin, HYDROcodone-acetaminophen, levalbuterol   Vital Signs    Vitals:   02/01/16 0148 02/01/16 0500 02/01/16 0552 02/01/16 0756  BP:   140/75   Pulse:      Resp:      Temp:  98.3 F (36.8 C)  98.4 F (36.9 C)  TempSrc:    Oral  SpO2: 94%     Weight:  134 lb 7.7 oz (61 kg)    Height:       No intake or output data in the 24 hours ending 02/01/16 0828 Filed Weights   01/30/16 0500 01/31/16 0500 02/01/16 0500  Weight: 132 lb 11.5 oz (60.2 kg) 134 lb 7.7 oz (61 kg) 134 lb 7.7 oz (61 kg)    Physical Exam    GEN: Well nourished, well developed, in no acute distress.  HEENT: Grossly normal.  Neck: Supple, no JVD, carotid bruits, or masses. Cardiac: IRRR, no murmurs, rubs, or gallops. No clubbing, cyanosis, edema.  Radials/DP/PT 2+ and equal bilaterally.  Respiratory:  Bilateral  wheezing, inspiratory and expiratory, improved from yesterday. Occasional non-prouductiv e coughing.  GI: Soft, nontender, nondistended, BS + x 4. MS: no deformity or atrophy. Skin: warm and dry, no rash. Neuro:  Strength and sensation are intact.Some tremors from steroids.  Psych: AAOx3.  Normal affect.  Labs    CBC  Recent Labs  01/31/16 1616 02/01/16 0451  WBC 13.7* 11.0*  HGB 8.7* 8.1*  HCT 27.4* 26.5*  MCV 84.0 84.1  PLT 323 [id number]   Basic Metabolic Panel  Recent Labs  01/30/16 0456 02/01/16 0451  NA 132* 134*  K 4.7 4.7  CL 100* 99*  CO2 24 26  GLUCOSE 138* 138*  BUN 35* 30*  CREATININE 1.09* 0.96  CALCIUM 8.6* 8.4*   Cardiac Enzymes No results for input(s): CKTOTAL, CKMB, CKMBINDEX, TROPONINI in the last 72 hours.   Telemetry    Atrial flutter, rates in the low 100's.  - Personally Reviewed  Rate :  NOw in 80s   Radiology    Dg Chest Port 1 View  Result Date: 01/31/2016 CLINICAL DATA:  Cough, CHF EXAM: PORTABLE CHEST 1 VIEW COMPARISON:  01/28/2016 FINDINGS: Right Port-A-Cath remains in place, unchanged. Scarring in the apices bilaterally. No other  confluent airspace opacities. Heart is normal size. No effusions or acute bony abnormality. IMPRESSION: Biapical scarring.  No active disease. Electronically Signed   By: Rolm Baptise M.D.   On: 01/31/2016 10:16    Cardiac Studies   Echocardiogram 06/28/2015 Left ventricle: The cavity size was normal. Wall thickness was   normal. Systolic function was normal. The estimated ejection   fraction was 55%. There is akinesis of the midanteroseptal   myocardium. The study is not technically sufficient to allow   evaluation of LV diastolic function. - Aortic valve: Mildly calcified annulus. Trileaflet. There was   trivial regurgitation. - Mitral valve: Mildly thickened leaflets . There was mild   regurgitation. - Left atrium: The atrium was mildly dilated. - Right ventricle: Systolic function was low normal. -  Right atrium: The atrium was mildly dilated. Central venous   pressure (est): 8 mm Hg. - Tricuspid valve: There was moderate regurgitation. - Pulmonary arteries: PA peak pressure: 41 mm Hg (S). - Pericardium, extracardiac: There was no pericardial effusion.   Patient Profile     Ms. Space is a 65 y.o.female history of COPD, PAF s/p DCCV June 2017, prevouis GI bleed related to heavy NSAID use, pancreatic cancer,HTN admitted with SOB and productive cough. Being treated for COPD exacerbation, cardiology is consulted for afib with RVR.  Assessment & Plan    1.Atrial flutter: Heart rate is better controlled at rest. Now on po amiodarone 200 mg daily and diltiazem 90 Q 6 hours. May be able to work toward long acting on discharge but needs to get moving and see how HR tolerates this. Would like her to get out of bed and increase activity to ascertain her her HR response to increased activity. Continue Xarelto.   2.COPD: Exacerbation, treatment with steroids and inhalers. Due to active wheezing will not place on BID dosing of amiodarone. CXR negative for CHF. No evidence of edema, but has significant wheezing with know COPD. Consider pulmonary rehab post hospitalization.   3.Anemia: Hgb down from 10.0 to 8.0  Check hemoccult due to use of Xarelto.No hemoccult recorded yet  3.Chronic Pain: Will need control to keep HR down.  Signed, Jory Sims, NP  02/01/2016, 8:28 AM   Pt seen and examined  I agree with findings as noted above by K lawrence. Pt is breathing some better  ON exam;  Cardiac Irreg irreg  NO S3 Lungs:  Wheezing bilaterally  Decreased airflow but better than yesterday  Ext without edema.  Agree with rate control  Can pull off amio  At 200 mg per day I do nto think it is contributing much   Keep on anticoagulation.

## 2016-02-02 DIAGNOSIS — J9611 Chronic respiratory failure with hypoxia: Secondary | ICD-10-CM

## 2016-02-02 LAB — GLUCOSE, CAPILLARY: GLUCOSE-CAPILLARY: 155 mg/dL — AB (ref 65–99)

## 2016-02-02 MED ORDER — METOPROLOL TARTRATE 25 MG PO TABS
25.0000 mg | ORAL_TABLET | Freq: Two times a day (BID) | ORAL | Status: DC
  Administered 2016-02-02 – 2016-02-05 (×7): 25 mg via ORAL
  Filled 2016-02-02 (×7): qty 1

## 2016-02-02 NOTE — Progress Notes (Signed)
Patient ID: Lindsay Salazar, female   DOB: 02/19/1950, 65 y.o.   MRN: [id number]  PROGRESS NOTE    Lindsay Salazar  [id number] DOB: 04/06/1950 DOA: 01/28/2016  PCP: Vesta Mixer   Brief Narrative:  65 year-old female with past medical history significant for COPD on home oxygen, atrial fibrillation, cancer, last infusion 01/24/2016 (paclitaxel and Gemzar). Patient presented to ED with shortness of breath and wheezing for past 24 hours prior to this admission. She was subsequently found to have rapid atrial fibrillation and cardiology has seen her in consultation.   Assessment & Plan:  Acute on chronic respiratory failure with hypoxia / acute COPD exacerbation - Patient was hypoxic on the admission with oxygen saturation 87% on room air but with nasal cannula oxygen support it has improved to 93% - Chest x-ray showed COPD exacerbation without focal airspace disease - Continue oxygen support via nasal cannula to keep oxygen saturation above 90% - Continue Atrovent and Xopenex every 6 hours scheduled and xopenex every 4 hours PRN shortness of breath or wheezing  - She has completed a course of levaquin  - Continue prednisone   Tobacco abuse - Continue nicotine patch - Counseled on cessation  Anemia of chronic disease -Secondary to history of malignancy and sequela of chemotherapy - Hemoglobin 8.1. Continue to follow  Chronic kidney disease stage III - Recent baseline creatinine 1.27  - Creatinine on this admission within baseline range   Atrial fibrillation with RVR.  - CHADS vasc score 3  - Likely secondary to hypoxia - Rate being managed with Cardizem 90 mg by mouth every 6 hours and amiodarone drip. Amiodarone drip stopped 12/29 by cardiology  - rate remains uncontrolled, so will add low dose lopressor - Continue xarelto   Pancreatic adenocarcinoma - Last chemotherapy 01/24/2016 - Follows with Kirby Crigler, Aware of pt admission   Leukocytosis  - Likely from  prednisone   Moderate protein calorie malnutrition  - In the context of chronic illness, malignancy  - Continue nutritional supplementation as recommended by nutritionist   Depression - Continue Lexapro   DVT prophylaxis: On Xarelto  Code Status: full Family Communication: family not at the bedside this am Disposition Plan: discharge home once heart rate stable   Consultants:   Cardiology  Procedures:   None   Antimicrobials:   Levaquin 01/29/2016 -->12/30   Subjective: Feels breathing improving. Has non productive cough  Objective: Vitals:   02/02/16 0700 02/02/16 0800 02/02/16 0900 02/02/16 0907  BP: (!) 129/99 134/76 121/72   Pulse: (!) 112 (!) 104 (!) 105   Resp: (!) 24 (!) 28 (!) 22   Temp:    98.2 F (36.8 C)  TempSrc:    Oral  SpO2: 91% 91% (!) 88%   Weight:      Height:        Intake/Output Summary (Last 24 hours) at 02/02/16 0944 Last data filed at 02/02/16 0532  Gross per 24 hour  Intake              960 ml  Output             1100 ml  Net             -140 ml   Filed Weights   01/31/16 0500 02/01/16 0500 02/02/16 0400  Weight: 61 kg (134 lb 7.7 oz) 61 kg (134 lb 7.7 oz) 60 kg (132 lb 4.4 oz)    Examination:  General exam: No acute distress Respiratory  system: wheezing improving. Fair air movement bilaterally  Cardiovascular system: S1 & S2 heard, irregular rhythm, tachycardic  Gastrointestinal system: (+) BS, non-tender, non-distended Central nervous system: Nonfocal Extremities: No lower extremity edema, pulses palpable Skin: Skin is warm and dry Psychiatry: Mood & affect appropriate. Not agitated or restless  Data Reviewed: I have personally reviewed following labs and imaging studies  CBC:  Recent Labs Lab 01/28/16 0119 01/29/16 0442 01/30/16 0456 01/31/16 1616 02/01/16 0451  WBC 9.7 10.4 13.0* 13.7* 11.0*  NEUTROABS 7.7  --   --   --   --   HGB 10.0* 8.4* 8.0* 8.7* 8.1*  HCT 31.4* 26.9* 25.6* 27.4* 26.5*  MCV  84.2 84.3 84.2 84.0 84.1  PLT 424* 392 363 323 [id number]   Basic Metabolic Panel:  Recent Labs Lab 01/28/16 0119 01/28/16 0648 01/29/16 0442 01/30/16 0456 02/01/16 0451  NA 137  --  134* 132* 134*  K 4.9  --  4.4 4.7 4.7  CL 105  --  103 100* 99*  CO2 25  --  22 24 26   GLUCOSE 116*  --  241* 138* 138*  BUN 30*  --  27* 35* 30*  CREATININE 0.82  --  1.12* 1.09* 0.96  CALCIUM 8.6*  --  8.4* 8.6* 8.4*  MG  --  2.9*  --   --   --   PHOS  --  3.8  --   --   --    GFR: Estimated Creatinine Clearance: 49.1 mL/min (by C-G formula based on SCr of 0.96 mg/dL). Liver Function Tests:  Recent Labs Lab 01/28/16 0119  AST 30  ALT 33  ALKPHOS 75  BILITOT 0.9  PROT 6.4*  ALBUMIN 3.4*   No results for input(s): LIPASE, AMYLASE in the last 168 hours. No results for input(s): AMMONIA in the last 168 hours. Coagulation Profile: No results for input(s): INR, PROTIME in the last 168 hours. Cardiac Enzymes:  Recent Labs Lab 01/28/16 0119 01/28/16 0648 01/28/16 1244  TROPONINI <0.03 <0.03 <0.03   BNP (last 3 results) No results for input(s): PROBNP in the last 8760 hours. HbA1C: No results for input(s): HGBA1C in the last 72 hours. CBG:  Recent Labs Lab 02/01/16 2024  GLUCAP 155*   Lipid Profile: No results for input(s): CHOL, HDL, LDLCALC, TRIG, CHOLHDL, LDLDIRECT in the last 72 hours. Thyroid Function Tests: No results for input(s): TSH, T4TOTAL, FREET4, T3FREE, THYROIDAB in the last 72 hours. Anemia Panel: No results for input(s): VITAMINB12, FOLATE, FERRITIN, TIBC, IRON, RETICCTPCT in the last 72 hours. Urine analysis:    Component Value Date/Time   COLORURINE YELLOW 12/24/2015 1612   APPEARANCEUR CLEAR 12/24/2015 1612   LABSPEC 1.025 12/24/2015 1612   PHURINE 6.0 12/24/2015 1612   GLUCOSEU NEGATIVE 12/24/2015 1612   HGBUR MODERATE (A) 12/24/2015 1612   BILIRUBINUR NEGATIVE 12/24/2015 1612   KETONESUR NEGATIVE 12/24/2015 1612   PROTEINUR NEGATIVE 12/24/2015 1612    NITRITE NEGATIVE 12/24/2015 1612   LEUKOCYTESUR SMALL (A) 12/24/2015 1612   Sepsis Labs: @LABRCNTIP (procalcitonin:4,lacticidven:4)    Recent Results (from the past 240 hour(s))  MRSA PCR Screening     Status: Abnormal   Collection Time: 01/28/16  6:53 AM  Result Value Ref Range Status   MRSA by PCR INVALID RESULTS, SPECIMEN SENT FOR CULTURE (A) NEGATIVE Final  MRSA culture     Status: None (Preliminary result)   Collection Time: 01/28/16  6:53 AM  Result Value Ref Range Status   Specimen Description NOSE  Final  Special Requests NONE  Final   Culture   Final    CULTURE REINCUBATED FOR BETTER GROWTH Performed at [facility]    Report Status PENDING  Incomplete  Rapid strep screen (not at [facility])     Status: None   Collection Time: 01/29/16 10:38 AM  Result Value Ref Range Status   Streptococcus, Group A Screen (Direct) NEGATIVE NEGATIVE Final      Radiology Studies: Dg Chest Port 1 View Result Date: 01/28/2016 COPD without focal airspace disease.    Scheduled Meds: . diltiazem  90 mg Oral Q6H  . escitalopram  10 mg Oral QPM  . feeding supplement (ENSURE ENLIVE)  237 mL Oral BID BM  . HYDROcodone-acetaminophen  1 tablet Oral Once  . ipratropium  0.5 mg Nebulization Q6H  . levalbuterol  1.25 mg Nebulization Q6H  . levofloxacin  750 mg Oral Daily  . metoprolol tartrate  25 mg Oral BID  . nicotine  14 mg Transdermal Daily  . pantoprazole  40 mg Oral Daily  . rivaroxaban  20 mg Oral Q supper   Continuous Infusions:    LOS: 5 days    Time spent: 25 minutes  Greater than 50% of the time spent on counseling and coordinating the care.   Flavio Lindroth, MD [facility] Pager [phone]  If 7PM-7AM, please contact night-coverage [email or web address] Password [email or web address] 02/02/2016, 9:44 AM

## 2016-02-03 LAB — CBC
HEMATOCRIT: 26.5 % — AB (ref 36.0–46.0)
Hemoglobin: 8.2 g/dL — ABNORMAL LOW (ref 12.0–15.0)
MCH: 26.1 pg (ref 26.0–34.0)
MCHC: 30.9 g/dL (ref 30.0–36.0)
MCV: 84.4 fL (ref 78.0–100.0)
Platelets: 223 10*3/uL (ref 150–400)
RBC: 3.14 MIL/uL — ABNORMAL LOW (ref 3.87–5.11)
RDW: 22.7 % — ABNORMAL HIGH (ref 11.5–15.5)
WBC: 12 10*3/uL — AB (ref 4.0–10.5)

## 2016-02-03 LAB — BASIC METABOLIC PANEL
Anion gap: 6 (ref 5–15)
BUN: 34 mg/dL — ABNORMAL HIGH (ref 6–20)
CHLORIDE: 101 mmol/L (ref 101–111)
CO2: 27 mmol/L (ref 22–32)
Calcium: 8 mg/dL — ABNORMAL LOW (ref 8.9–10.3)
Creatinine, Ser: 1.08 mg/dL — ABNORMAL HIGH (ref 0.44–1.00)
GFR calc non Af Amer: 52 mL/min — ABNORMAL LOW (ref >60)
Glucose, Bld: 124 mg/dL — ABNORMAL HIGH (ref 65–99)
POTASSIUM: 3.9 mmol/L (ref 3.5–5.1)
SODIUM: 134 mmol/L — AB (ref 135–145)

## 2016-02-03 MED ORDER — PREDNISONE 20 MG PO TABS
40.0000 mg | ORAL_TABLET | Freq: Every day | ORAL | Status: DC
  Administered 2016-02-03 – 2016-02-05 (×3): 40 mg via ORAL
  Filled 2016-02-03 (×3): qty 2

## 2016-02-03 NOTE — Progress Notes (Signed)
Patient ID: Lindsay Salazar, female   DOB: [birth year], 65 y.o.   MRN: [id number]  PROGRESS NOTE    Lindsay Salazar  [id number] DOB: [birth year] DOA: 01/28/2016  PCP: Vesta Mixer   Brief Narrative:  65 year-old female with past medical history significant for COPD on home oxygen, atrial fibrillation, cancer, last infusion 01/24/2016 (paclitaxel and Gemzar). Patient presented to ED with shortness of breath and wheezing for past 24 hours prior to this admission. She was subsequently found to have rapid atrial fibrillation and cardiology has seen her in consultation.   Assessment & Plan:  Acute on chronic respiratory failure with hypoxia / acute COPD exacerbation - Patient was hypoxic on the admission with oxygen saturation 87% on room air but with nasal cannula oxygen support it has improved to 93% - Chest x-ray showed COPD exacerbation without focal airspace disease - Continue oxygen support via nasal cannula to keep oxygen saturation above 90% - Continue Atrovent and Xopenex every 6 hours scheduled and xopenex every 4 hours PRN shortness of breath or wheezing  - She has completed a course of levaquin  - Continue prednisone   Tobacco abuse - Continue nicotine patch - Counseled on cessation  Anemia of chronic disease -Secondary to history of malignancy and sequela of chemotherapy - Hemoglobin 8.2. Continue to follow  Chronic kidney disease stage III - Recent baseline creatinine 1.27  - Creatinine on this admission within baseline range   Atrial fibrillation with RVR.  - CHADS vasc score 3  - Likely secondary to hypoxia - Rate being managed with Cardizem 90 mg by mouth every 6 hours and amiodarone drip. Amiodarone drip stopped 12/29 by cardiology  - heart rate is better today. Continue metoprolol - Continue xarelto   Pancreatic adenocarcinoma - Last chemotherapy 01/24/2016 - Follows with Kirby Crigler, Aware of pt admission   Leukocytosis  - Likely from prednisone    Moderate protein calorie malnutrition  - In the context of chronic illness, malignancy  - Continue nutritional supplementation as recommended by nutritionist   Depression - Continue Lexapro   DVT prophylaxis: On Xarelto  Code Status: full Family Communication: family not at the bedside this am Disposition Plan: discharge home once heart rate stable   Consultants:   Cardiology  Procedures:   None   Antimicrobials:   Levaquin 01/29/2016 -->12/30   Subjective: Feeling better today. Shortness of breath improving  Objective: Vitals:   02/03/16 0500 02/03/16 0600 02/03/16 0720 02/03/16 0730  BP: (!) 103/48 (!) 127/96 132/69   Pulse: 76 67 82   Resp: (!) 22 (!) 21 20   Temp:    97.9 F (36.6 C)  TempSrc:    Oral  SpO2: 92% 98% 95%   Weight: 59.9 kg (132 lb 0.9 oz)     Height:        Intake/Output Summary (Last 24 hours) at 02/03/16 0854 Last data filed at 02/02/16 2300  Gross per 24 hour  Intake                0 ml  Output              500 ml  Net             -500 ml   Filed Weights   02/01/16 0500 02/02/16 0400 02/03/16 0500  Weight: 61 kg (134 lb 7.7 oz) 60 kg (132 lb 4.4 oz) 59.9 kg (132 lb 0.9 oz)    Examination:  General exam: No acute  distress Respiratory system: bilateral wheezing on forced expiration Cardiovascular system: S1 & S2 heard, irregular rhythm, Gastrointestinal system: (+) BS, non-tender, non-distended Central nervous system: Nonfocal Extremities: No lower extremity edema, pulses palpable Skin: Skin is warm and dry Psychiatry: Mood & affect appropriate. Not agitated or restless  Data Reviewed: I have personally reviewed following labs and imaging studies  CBC:  Recent Labs Lab 01/28/16 0119 01/29/16 0442 01/30/16 0456 01/31/16 1616 02/01/16 0451 02/03/16 0431  WBC 9.7 10.4 13.0* 13.7* 11.0* 12.0*  NEUTROABS 7.7  --   --   --   --   --   HGB 10.0* 8.4* 8.0* 8.7* 8.1* 8.2*  HCT 31.4* 26.9* 25.6* 27.4* 26.5* 26.5*   MCV 84.2 84.3 84.2 84.0 84.1 84.4  PLT 424* 392 363 323 273 [id number]   Basic Metabolic Panel:  Recent Labs Lab 01/28/16 0119 01/28/16 0648 01/29/16 0442 01/30/16 0456 02/01/16 0451 02/03/16 0431  NA 137  --  134* 132* 134* 134*  K 4.9  --  4.4 4.7 4.7 3.9  CL 105  --  103 100* 99* 101  CO2 25  --  22 24 26 27   GLUCOSE 116*  --  241* 138* 138* 124*  BUN 30*  --  27* 35* 30* 34*  CREATININE 0.82  --  1.12* 1.09* 0.96 1.08*  CALCIUM 8.6*  --  8.4* 8.6* 8.4* 8.0*  MG  --  2.9*  --   --   --   --   PHOS  --  3.8  --   --   --   --    GFR: Estimated Creatinine Clearance: 43.6 mL/min (by C-G formula based on SCr of 1.08 mg/dL (H)). Liver Function Tests:  Recent Labs Lab 01/28/16 0119  AST 30  ALT 33  ALKPHOS 75  BILITOT 0.9  PROT 6.4*  ALBUMIN 3.4*   No results for input(s): LIPASE, AMYLASE in the last 168 hours. No results for input(s): AMMONIA in the last 168 hours. Coagulation Profile: No results for input(s): INR, PROTIME in the last 168 hours. Cardiac Enzymes:  Recent Labs Lab 01/28/16 0119 01/28/16 0648 01/28/16 1244  TROPONINI <0.03 <0.03 <0.03   BNP (last 3 results) No results for input(s): PROBNP in the last 8760 hours. HbA1C: No results for input(s): HGBA1C in the last 72 hours. CBG:  Recent Labs Lab 02/01/16 2024  GLUCAP 155*   Lipid Profile: No results for input(s): CHOL, HDL, LDLCALC, TRIG, CHOLHDL, LDLDIRECT in the last 72 hours. Thyroid Function Tests: No results for input(s): TSH, T4TOTAL, FREET4, T3FREE, THYROIDAB in the last 72 hours. Anemia Panel: No results for input(s): VITAMINB12, FOLATE, FERRITIN, TIBC, IRON, RETICCTPCT in the last 72 hours. Urine analysis:    Component Value Date/Time   COLORURINE YELLOW 12/24/2015 1612   APPEARANCEUR CLEAR 12/24/2015 1612   LABSPEC 1.025 12/24/2015 1612   PHURINE 6.0 12/24/2015 1612   GLUCOSEU NEGATIVE 12/24/2015 1612   HGBUR MODERATE (A) 12/24/2015 1612   BILIRUBINUR NEGATIVE 12/24/2015 1612    KETONESUR NEGATIVE 12/24/2015 1612   PROTEINUR NEGATIVE 12/24/2015 1612   NITRITE NEGATIVE 12/24/2015 1612   LEUKOCYTESUR SMALL (A) 12/24/2015 1612   Sepsis Labs: @LABRCNTIP (procalcitonin:4,lacticidven:4)    Recent Results (from the past 240 hour(s))  MRSA PCR Screening     Status: Abnormal   Collection Time: 01/28/16  6:53 AM  Result Value Ref Range Status   MRSA by PCR INVALID RESULTS, SPECIMEN SENT FOR CULTURE (A) NEGATIVE Final  MRSA culture     Status:  None (Preliminary result)   Collection Time: 01/28/16  6:53 AM  Result Value Ref Range Status   Specimen Description NOSE  Final   Special Requests NONE  Final   Culture   Final    CULTURE REINCUBATED FOR BETTER GROWTH Performed at [facility]    Report Status PENDING  Incomplete  Rapid strep screen (not at [facility])     Status: None   Collection Time: 01/29/16 10:38 AM  Result Value Ref Range Status   Streptococcus, Group A Screen (Direct) NEGATIVE NEGATIVE Final      Radiology Studies: Dg Chest Port 1 View Result Date: 01/28/2016 COPD without focal airspace disease.    Scheduled Meds: . diltiazem  90 mg Oral Q6H  . escitalopram  10 mg Oral QPM  . feeding supplement (ENSURE ENLIVE)  237 mL Oral BID BM  . HYDROcodone-acetaminophen  1 tablet Oral Once  . ipratropium  0.5 mg Nebulization Q6H  . levalbuterol  1.25 mg Nebulization Q6H  . metoprolol tartrate  25 mg Oral BID  . nicotine  14 mg Transdermal Daily  . pantoprazole  40 mg Oral Daily  . rivaroxaban  20 mg Oral Q supper   Continuous Infusions:    LOS: 6 days    Time spent: 25 minutes  Greater than 50% of the time spent on counseling and coordinating the care.   Rydge Texidor, MD [facility] Pager [phone]  If 7PM-7AM, please contact night-coverage [email or web address] Password [facility] 02/03/2016, 8:54 AM

## 2016-02-04 LAB — CREATININE, SERUM
Creatinine, Ser: 0.97 mg/dL (ref 0.44–1.00)
GFR calc non Af Amer: 59 mL/min — ABNORMAL LOW (ref >60)

## 2016-02-04 MED ORDER — DILTIAZEM HCL ER COATED BEADS 180 MG PO CP24
360.0000 mg | ORAL_CAPSULE | Freq: Every day | ORAL | Status: DC
  Administered 2016-02-04 – 2016-02-05 (×2): 360 mg via ORAL
  Filled 2016-02-04 (×2): qty 2

## 2016-02-04 NOTE — Progress Notes (Signed)
Patient ID: [name], female   DOB: 08-09-1950, 66 y.o.   MRN: [id number]  PROGRESS NOTE    Lindsay Salazar  [id number] DOB: March 07, 1950 DOA: 01/28/2016  PCP: Vesta Mixer   Brief Narrative:  66 year-old female with past medical history significant for COPD on home oxygen, atrial fibrillation, cancer, last infusion 01/24/2016 (paclitaxel and Gemzar). Patient presented to ED with shortness of breath and wheezing for past 24 hours prior to this admission. She was subsequently found to have rapid atrial fibrillation and cardiology has seen her in consultation.   Assessment & Plan:  Acute on chronic respiratory failure with hypoxia / acute COPD exacerbation - Patient was hypoxic on the admission with oxygen saturation 87% on room air but with nasal cannula oxygen support it has improved to 93% - Chest x-ray showed COPD exacerbation without focal airspace disease - Continue oxygen support via nasal cannula to keep oxygen saturation above 90% - Continue Atrovent and Xopenex every 6 hours scheduled and xopenex every 4 hours PRN shortness of breath or wheezing  - She has completed a course of levaquin  - Continue prednisone   Tobacco abuse - Continue nicotine patch - Counseled on cessation  Anemia of chronic disease -Secondary to history of malignancy and sequela of chemotherapy - Hemoglobin 8.2. Continue to follow  Chronic kidney disease stage III - Recent baseline creatinine 1.27  - Creatinine on this admission within baseline range   Atrial fibrillation with RVR.  - CHADS vasc score 3  - Likely secondary to hypoxia - Rate being managed with Cardizem 90 mg by mouth every 6 hours and amiodarone drip. Amiodarone drip stopped 12/29 by cardiology  - heart rate is better today. Continue metoprolol - Continue xarelto   Pancreatic adenocarcinoma - Last chemotherapy 01/24/2016 - Follows with Kirby Crigler, Aware of pt admission   Leukocytosis  - Likely from prednisone    Moderate protein calorie malnutrition  - In the context of chronic illness, malignancy  - Continue nutritional supplementation as recommended by nutritionist   Depression - Continue Lexapro  Generalized weakness. -Physical therapy evaluation to determine discharge disposition   DVT prophylaxis: On Xarelto  Code Status: full Family Communication: family not at the bedside this am Disposition Plan: discharge likely tomorrow pending physical therapy evaluation   Consultants:   Cardiology  Procedures:   None   Antimicrobials:   Levaquin 01/29/2016 -->12/30   Subjective: Feels that breathing is improving. No cough. On walking, she says she feels to weak to walk and is concerned about returning home  Objective: Vitals:   02/04/16 0500 02/04/16 0756 02/04/16 1327 02/04/16 1330  BP:      Pulse:      Resp:      Temp:    97.6 F (36.4 C)  TempSrc:    Oral  SpO2:  97% 97%   Weight: 59 kg (130 lb 1.1 oz)     Height:        Intake/Output Summary (Last 24 hours) at 02/04/16 1808 Last data filed at 02/04/16 1330  Gross per 24 hour  Intake              240 ml  Output              325 ml  Net              -85 ml   Filed Weights   02/02/16 0400 02/03/16 0500 02/04/16 0500  Weight: 60 kg (132 lb 4.4  oz) 59.9 kg (132 lb 0.9 oz) 59 kg (130 lb 1.1 oz)    Examination:  General exam: No acute distress Respiratory system: clear bilaterally Cardiovascular system: S1 & S2 heard, irregular rhythm, Gastrointestinal system: (+) BS, non-tender, non-distended Central nervous system: Nonfocal Extremities: No lower extremity edema, pulses palpable Skin: Skin is warm and dry Psychiatry: Mood & affect appropriate. Not agitated or restless  Data Reviewed: I have personally reviewed following labs and imaging studies  CBC:  Recent Labs Lab 01/29/16 0442 01/30/16 0456 01/31/16 1616 02/01/16 0451 02/03/16 0431  WBC 10.4 13.0* 13.7* 11.0* 12.0*  HGB 8.4* 8.0* 8.7*  8.1* 8.2*  HCT 26.9* 25.6* 27.4* 26.5* 26.5*  MCV 84.3 84.2 84.0 84.1 84.4  PLT 392 363 323 273 [id number]   Basic Metabolic Panel:  Recent Labs Lab 01/29/16 0442 01/30/16 0456 02/01/16 0451 02/03/16 0431 02/04/16 0533  NA 134* 132* 134* 134*  --   K 4.4 4.7 4.7 3.9  --   CL 103 100* 99* 101  --   CO2 22 24 26 27   --   GLUCOSE 241* 138* 138* 124*  --   BUN 27* 35* 30* 34*  --   CREATININE 1.12* 1.09* 0.96 1.08* 0.97  CALCIUM 8.4* 8.6* 8.4* 8.0*  --    GFR: Estimated Creatinine Clearance: 48.6 mL/min (by C-G formula based on SCr of 0.97 mg/dL). Liver Function Tests: No results for input(s): AST, ALT, ALKPHOS, BILITOT, PROT, ALBUMIN in the last 168 hours. No results for input(s): LIPASE, AMYLASE in the last 168 hours. No results for input(s): AMMONIA in the last 168 hours. Coagulation Profile: No results for input(s): INR, PROTIME in the last 168 hours. Cardiac Enzymes: No results for input(s): CKTOTAL, CKMB, CKMBINDEX, TROPONINI in the last 168 hours. BNP (last 3 results) No results for input(s): PROBNP in the last 8760 hours. HbA1C: No results for input(s): HGBA1C in the last 72 hours. CBG:  Recent Labs Lab 02/01/16 2024  GLUCAP 155*   Lipid Profile: No results for input(s): CHOL, HDL, LDLCALC, TRIG, CHOLHDL, LDLDIRECT in the last 72 hours. Thyroid Function Tests: No results for input(s): TSH, T4TOTAL, FREET4, T3FREE, THYROIDAB in the last 72 hours. Anemia Panel: No results for input(s): VITAMINB12, FOLATE, FERRITIN, TIBC, IRON, RETICCTPCT in the last 72 hours. Urine analysis:    Component Value Date/Time   COLORURINE YELLOW 12/24/2015 [place] 12/24/2015 1612   LABSPEC 1.025 12/24/2015 1612   PHURINE 6.0 12/24/2015 1612   GLUCOSEU NEGATIVE 12/24/2015 1612   HGBUR MODERATE (A) 12/24/2015 1612   BILIRUBINUR NEGATIVE 12/24/2015 1612   KETONESUR NEGATIVE 12/24/2015 1612   PROTEINUR NEGATIVE 12/24/2015 1612   NITRITE NEGATIVE 12/24/2015 1612    LEUKOCYTESUR SMALL (A) 12/24/2015 1612   Sepsis Labs: @LABRCNTIP (procalcitonin:4,lacticidven:4)    Recent Results (from the past 240 hour(s))  MRSA PCR Screening     Status: Abnormal   Collection Time: 01/28/16  6:53 AM  Result Value Ref Range Status   MRSA by PCR INVALID RESULTS, SPECIMEN SENT FOR CULTURE (A) NEGATIVE Final  MRSA culture     Status: None (Preliminary result)   Collection Time: 01/28/16  6:53 AM  Result Value Ref Range Status   Specimen Description NOSE  Final   Special Requests NONE  Final   Culture   Final    CULTURE REINCUBATED FOR BETTER GROWTH Performed at [facility]    Report Status PENDING  Incomplete  Rapid strep screen (not at [facility])     Status:  None   Collection Time: 01/29/16 10:38 AM  Result Value Ref Range Status   Streptococcus, Group A Screen (Direct) NEGATIVE NEGATIVE Final      Radiology Studies: Dg Chest Port 1 View Result Date: 01/28/2016 COPD without focal airspace disease.    Scheduled Meds: . diltiazem  360 mg Oral Daily  . escitalopram  10 mg Oral QPM  . feeding supplement (ENSURE ENLIVE)  237 mL Oral BID BM  . ipratropium  0.5 mg Nebulization Q6H  . levalbuterol  1.25 mg Nebulization Q6H  . metoprolol tartrate  25 mg Oral BID  . nicotine  14 mg Transdermal Daily  . pantoprazole  40 mg Oral Daily  . predniSONE  40 mg Oral Q breakfast  . rivaroxaban  20 mg Oral Q supper   Continuous Infusions:    LOS: 7 days    Time spent: 25 minutes  Greater than 50% of the time spent on counseling and coordinating the care.   Lindsay Koskela, MD [facility] Pager [phone]  If 7PM-7AM, please contact night-coverage [email or web address] Password [email or web address] 02/04/2016, 6:08 PM

## 2016-02-05 ENCOUNTER — Ambulatory Visit: Payer: Medicare HMO | Admitting: Orthopaedic Surgery

## 2016-02-05 ENCOUNTER — Ambulatory Visit: Payer: Medicare HMO | Admitting: Adult Health

## 2016-02-05 ENCOUNTER — Encounter: Payer: Self-pay | Admitting: Orthopaedic Surgery

## 2016-02-05 MED ORDER — LEVALBUTEROL HCL 1.25 MG/0.5ML IN NEBU: 1.2500 mg | INHALATION_SOLUTION | RESPIRATORY_TRACT | 12 refills | Status: DC | PRN

## 2016-02-05 MED ORDER — METOPROLOL TARTRATE 25 MG PO TABS: 25.0000 mg | ORAL_TABLET | Freq: Two times a day (BID) | ORAL | 0 refills | Status: DC

## 2016-02-05 MED ORDER — PREDNISONE 10 MG PO TABS: ORAL_TABLET | ORAL | 0 refills | Status: DC

## 2016-02-05 MED ORDER — DILTIAZEM HCL ER COATED BEADS 360 MG PO CP24: 360.0000 mg | ORAL_CAPSULE | Freq: Every day | ORAL | 1 refills | Status: DC

## 2016-02-05 NOTE — Evaluation (Signed)
Physical Therapy Evaluation Patient Details Name: Lindsay Salazar MRN: [id number] DOB: Apr 29, 1950 Today's Date: 02/05/2016   History of Present Illness  Lindsay Salazar is a 66 y.o. female with medical history significant of arthritis, asthma, chronic atrial fibrillation, COPD, on home oxygen, headaches, fibromyalgia, GERD hypertension, pancreatic cancer, anxiety who is coming to the emergency department due to worsening of chronic shortness of breath, wheezing, productive cough of grayish sputum after she ran out of her bronchodilators. She was recently admitted and discharged from 12/17 to 12/20. She has been taking prednisone 40 mg daily after discharge, but is not sure if it has helped. She denies fever, but complains of chills and night sweats. She denies earache, sore throat or rhinorrhea  Clinical Impression  Pt manual muscle test demonstrate 4/5 to 5/5 strength of LE.  Pt activity limitation is due to respiratory vs. Physical.  Pt only able to ambulate with rolling walker for 24 feet prior to being SOB and wanting to sit down.  After resting five minute therapist asked if pt would try ambulating again but pt stated not at this time.      Follow Up Recommendations Home health PT    Equipment Recommendations  None recommended by PT    Recommendations for Other Services  N/A    Precautions / Restrictions Precautions Precautions: None Restrictions Weight Bearing Restrictions: No      Mobility  Bed Mobility Overal bed mobility: Modified Independent                Transfers Overall transfer level: Modified independent                  Ambulation/Gait Ambulation/Gait assistance: Modified independent (Device/Increase time) Ambulation Distance (Feet): 24 Feet Assistive device: Rolling walker (2 wheeled)     Gait velocity interpretation: Below normal speed for age/gender    Stairs            Wheelchair Mobility    Modified Rankin (Stroke Patients Only)        Balance                                             Pertinent Vitals/Pain Pain Assessment: No/denies pain    Home Living Family/patient expects to be discharged to:: Skilled nursing facility Living Arrangements: Spouse/significant other Available Help at Discharge: Family Type of Home: House Home Access: Stairs to enter   Technical brewer of Steps: 4 Home Layout: One level Home Equipment: Environmental consultant - 2 wheels      Prior Function Level of Independence: Independent with assistive device(s)         Comments: Pt states when it is not cold she will walk 3-4 blocks a day     Hand Dominance        Extremity/Trunk Assessment        Lower Extremity Assessment Lower Extremity Assessment: Overall WFL for tasks assessed       Communication   Communication: No difficulties  Cognition Arousal/Alertness: Awake/alert Behavior During Therapy: WFL for tasks assessed/performed Overall Cognitive Status: Within Functional Limits for tasks assessed                             Assessment/Plan    PT Assessment Patient needs continued PT services  PT Problem List Decreased activity tolerance  PT Treatment Interventions Functional mobility training;Therapeutic activities    PT Goals (Current goals can be found in the Care Plan section)  Acute Rehab PT Goals Patient Stated Goal: to be stronger PT Goal Formulation: With patient Time For Goal Achievement: 02/08/16 Potential to Achieve Goals: Good    Frequency Min 3X/week   Barriers to discharge           End of Session Equipment Utilized During Treatment: Gait belt Activity Tolerance: Other (comment) (SOB limits ambulation ) Patient left: in chair;with call bell/phone within reach Nurse Communication: Mobility status         Time: [id number] PT Time Calculation (min) (ACUTE ONLY): 26 min   Charges:   PT Evaluation $PT Eval Low Complexity: 1 Procedure     PT G  CodesRayetta Humphrey, PT CLT [phone] 02/05/2016, 3:14 PM

## 2016-02-05 NOTE — Care Management Note (Signed)
Case Management Note  Patient Details  Name: Lindsay Salazar MRN: [id number] Date of Birth: 1950-05-05   Expected Discharge Date:  02/02/16               Expected Discharge Plan:  Home/Self Care  In-House Referral:  NA  Discharge planning Services  CM Consult  Post Acute Care Choice:  NA Choice offered to:  NA  DME Arranged:    DME Agency:     HH Arranged:    [place] Agency:     Status of Service:  Completed, signed off  If discussed at [employer] of Stay Meetings, dates discussed:    Additional Comments: Patient recommended for [facility] PT. She states she can not go home with [facility] PT, that she does not have any heat and that her house is in bad shape. She states she would like to talk with CSW regarding option for ALF vs. SNF using her Medicaid.   Merced Hanners, Chauncey Reading, RN 02/05/2016, 3:24 PM

## 2016-02-05 NOTE — Progress Notes (Signed)
   Subjective: Pt breathing better  NO CP   Objective: Vitals:   02/05/16 0140 02/05/16 0500 02/05/16 0800 02/05/16 1000  BP:  113/70  (!) 126/55  Pulse:  85  74  Resp:  18    Temp:  97.7 F (36.5 C)    TempSrc:  Oral    SpO2: 98% 100% 98%   Weight:      Height:       Weight change:   Intake/Output Summary (Last 24 hours) at 02/05/16 1120 Last data filed at 02/05/16 0900  Gross per 24 hour  Intake              360 ml  Output                0 ml  Net              360 ml    General: Alert, awake, oriented x3, in no acute distress Neck:  JVP is normal Heart: Irregular rate and rhythm, without murmurs, rubs, gallops.  Lungs: Wheezes bilaterally  Moving air  Exemities:  No edema.   Neuro: Grossly intact, nonfocal.  Tele  Afib  80s    Lab Results: No results found for this or any previous visit (from the past 24 hour(s)).  Studies/Results: No results found.  Medications:REviewed     @PROBHOSP @  1  Atrial flutter    HR improved on current regimen  WOuld continue current regimen  Continue Xarelto    2  COPD  Still wheezing but improved from last week    3  Anemia  This weill need to be followed closely since pt is on anticoagulatoin  IM to follow    4  Chronic pain    Note plans for possible tx to SNF  WIll make sure she has outpt f/u    LOS: 8 days   Dorris Carnes 02/05/2016, 11:20 AM

## 2016-02-05 NOTE — Care Management Note (Signed)
Case Management Note  Patient Details  Name: AVORY PLATERO MRN: [id number] Date of Birth: 05-Aug-1950    Expected Discharge Date:  02/02/16               Expected Discharge Plan:  Home/Self Care  In-House Referral:  Clinical Social Work  Discharge planning Services  CM Consult  Post Acute Care Choice:  Home Health Choice offered to:  Patient  DME Arranged:    DME Agency:     HH Arranged:  RN, PT, Social Work [employer] Agency:  [facility] (with [place] services)  Status of Service:  Completed, signed off  If discussed at [place] of Stay Meetings, dates discussed:  02/05/2016  Additional Comments: Patient discharging home today. Recommended for [facility] PT. Patient has been reluctant in the past to accept [facility], but agreeable today. She will have [place] with [place] services with [facility]. Karolee Stamps of [place] notified and will obtain orders from chart.   Miel Wisener, Chauncey Reading, RN 02/05/2016, 3:46 PM

## 2016-02-05 NOTE — Clinical Social Work Note (Signed)
CSW met with pt following referral for possible placement. Pt was irritated with situation and indicates she is aware that she does not qualify for SNF. CSW offered ALF placement and discussed Medicaid and that pt would have to give up most of her check. She was very frustrated and loudly states, "I guess I will just go home then." Pt reports she has had home health before, but they cannot go in to her house because she has a pit bull and has to meet them on the porch. Pt is refusing placement at this time. She plans to discuss with her boyfriend and daughter and is aware that home health social worker can assist with placement from home if needed.  Benay Pike, [place]

## 2016-02-05 NOTE — Progress Notes (Deleted)
Cardiology Office Note   Date:  02/05/2016   ID:  [name], DOB 1950-12-25, MRN [id number]  PCP:  [name], PA-C  Cardiologist:  Cloria Spring, NP   No chief complaint on file.     History of Present Illness: Lindsay Salazar is a 66 y.o. female who presents for ongoing assessment and management of paroxysmal atrial fibrillation, history of DCCV June 2017, previous GI bleed related to heavy NSAID use. History of pancreatic cancer, hypertension. The patient was seen during consultation on 01/29/2016 in the setting of COPD exacerbation, and A. fib RVR.    Past Medical History:  Diagnosis Date  . Anginal pain ([place])    thought she was having   heartburn  . Arthritis    bursitis , fibromyalgia  . Arthritis    "right hip" (11/27/2015)  . Asthma   . Atrial fibrillation ([place])   . Chronic bronchitis ([place])   . Chronic right hip pain   . COPD (chronic obstructive pulmonary disease) ([place])    wears home o2 prn  . Daily headache    "last 2-3 months" (11/27/2015)  . Exposure to TB    "mom had it when she was pregnant with me"  . Fibromyalgia   . GERD (gastroesophageal reflux disease)    use to have it but not now  . Heart murmur    [age] years old  . Hypertension   . On home oxygen therapy    11/27/2015 "whenever I need it; don't know how many liters"  . Pancreatic cancer ([place])   . Pneumonia    "several times" (11/27/2015)  . Pneumothorax     Past Surgical History:  Procedure Laterality Date  . ANTERIOR CERVICAL DECOMP/DISCECTOMY FUSION     "put 4 screws in"  . BACK SURGERY    . CARDIAC CATHETERIZATION  08/2004   Archie Endo 06/18/2010  . CARDIOVERSION N/A 07/18/2015   Procedure: CARDIOVERSION;  Surgeon: Josue Hector, MD;  Location[facility] ENDO [facility];  Service: Cardiovascular;  Laterality: N/A;  . ESOPHAGOGASTRODUODENOSCOPY N/A 08/09/2015   Procedure: ESOPHAGOGASTRODUODENOSCOPY (EGD);  Surgeon: Rogene Houston, MD;  Location: [facility] ENDO SUITE;  Service: Endoscopy;   Laterality: N/A;  . EUS N/A 11/29/2015   Procedure: UPPER ENDOSCOPIC ULTRASOUND (EUS) RADIAL;  Surgeon: Milus Banister, MD;  Location: [facility] ENDOSCOPY;  Service: Endoscopy;  Laterality: N/A;  . IR GENERIC HISTORICAL  12/18/2015   IR US GUIDE VASC ACCESS RIGHT 12/18/2015 Sandi Mariscal, MD [facility]-INTERV RAD  . IR GENERIC HISTORICAL  12/18/2015   IR FLUORO GUIDE PORT INSERTION RIGHT 12/18/2015 Sandi Mariscal, MD [facility]-INTER[facility]  . TONSILLECTOMY    . TUBAL LIGATION       No current facility-administered medications for this visit.    No current outpatient prescriptions on file.   Facility-Administered Medications Ordered in Other Visits  Medication Dose Route Frequency Provider Last Rate Last Dose  . diltiazem (CARDIZEM CD) 24 hr capsule 360 mg  360 mg Oral Daily Kathie Dike, MD   360 mg at 02/04/16 0903  . escitalopram (LEXAPRO) tablet 10 mg  10 mg Oral QPM Reubin Milan, MD   10 mg at 02/04/16 1800  . feeding supplement (ENSURE ENLIVE) (ENSURE ENLIVE) liquid 237 mL  237 mL Oral BID BM Kathie Dike, MD   237 mL at 02/03/16 1000  . furosemide (LASIX) tablet 20 mg  20 mg Oral Daily PRN Reubin Milan, MD      . guaiFENesin (ROBITUSSIN) 100  MG/5ML solution 200 mg  10 mL Oral Q4H PRN Reubin Milan, MD   200 mg at 02/02/16 2009  . HYDROcodone-acetaminophen (NORCO/VICODIN) 5-325 MG per tablet 1 tablet  1 tablet Oral Q6H PRN Reubin Milan, MD   1 tablet at 02/05/16 0424  . ipratropium (ATROVENT) nebulizer solution 0.5 mg  0.5 mg Nebulization Q6H Reubin Milan, MD   0.5 mg at 02/05/16 0138  . levalbuterol (XOPENEX) nebulizer solution 1.25 mg  1.25 mg Nebulization Q6H Kathie Dike, MD   1.25 mg at 02/05/16 0140  . levalbuterol (XOPENEX) nebulizer solution 1.25 mg  1.25 mg Nebulization Q4H PRN Robbie Lis, MD   1.25 mg at 02/01/16 2332  . metoprolol tartrate (LOPRESSOR) tablet 25 mg  25 mg Oral BID Kathie Dike, MD   25 mg at 02/04/16 2213  . nicotine (NICODERM CQ - dosed in mg/24  hours) patch 14 mg  14 mg Transdermal Daily Kathie Dike, MD   14 mg at 02/04/16 0904  . pantoprazole (PROTONIX) EC tablet 40 mg  40 mg Oral Daily Reubin Milan, MD   40 mg at 02/04/16 0902  . predniSONE (DELTASONE) tablet 40 mg  40 mg Oral Q breakfast Kathie Dike, MD   40 mg at 02/04/16 0800  . rivaroxaban (XARELTO) tablet 20 mg  20 mg Oral Q supper Reubin Milan, MD   20 mg at 02/04/16 1658    Allergies:   Ibuprofen; Penicillins; Tiotropium bromide monohydrate; and Tramadol    Social History:  The patient  reports that she quit smoking about 2 months ago. Her smoking use included Cigarettes. She started smoking about 46 years ago. She has a 23.00 pack-year smoking history. She has never used smokeless tobacco. She reports that she does not drink alcohol or use drugs.   Family History:  The patient's family history includes COPD in her mother; Diabetes in her father; Stroke in her father.    ROS: All other systems are reviewed and negative. Unless otherwise mentioned in H&P    PHYSICAL EXAM: VS:  There were no vitals taken for this visit. , BMI There is no height or weight on file to calculate BMI. GEN: Well nourished, well developed, in no acute distress HEENT: normal Neck: no JVD, carotid bruits, or masses Cardiac: ***RRR; no murmurs, rubs, or gallops,no edema  Respiratory:  clear to auscultation bilaterally, normal work of breathing GI: soft, nontender, nondistended, + BS MS: no deformity or atrophy Skin: warm and dry, no rash Neuro:  Strength and sensation are intact Psych: euthymic mood, full affect   EKG:  EKG {ACTION; IS/IS [id number] ordered today. The ekg ordered today demonstrates ***   Recent Labs: 06/27/2015: TSH 2.678 01/28/2016: ALT 33; B Natriuretic Peptide 113.0; Magnesium 2.9 02/03/2016: BUN 34; Hemoglobin 8.2; Platelets 223; Potassium 3.9; Sodium 134 02/04/2016: Creatinine, Ser 0.97    Lipid Panel No results found for: CHOL, TRIG, HDL,  CHOLHDL, VLDL, LDLCALC, LDLDIRECT    Wt Readings from Last 3 Encounters:  02/04/16 130 lb 1.1 oz (59 kg)  01/24/16 132 lb (59.9 kg)  01/21/16 129 lb 10.1 oz (58.8 kg)      Other studies Reviewed: Additional studies/ records that were reviewed today include: ***. Review of the above records demonstrates: ***   ASSESSMENT AND PLAN:  1.  ***   Current medicines are reviewed at length with the patient today.    Labs/ tests ordered today include: *** No orders of the defined types were placed in  this encounter.    Disposition:   FU with *** in {gen number [id number] {TIME; UNITS DAY/WEEK/MONTH:19136}   Signed, Jory Sims, NP  02/05/2016 7:16 AM    [facility] [address] [place], [place] [address] Phone: [phone]; Fax: [phone]

## 2016-02-05 NOTE — Discharge Summary (Signed)
Physician Discharge Summary  Lindsay Salazar [facility] [id number] DOB: 11/02/1950 DOA: 01/28/2016  PCP: [name], PA-C  Admit date: 01/28/2016 Discharge date: 02/05/2016  Admitted From: home Disposition:  home  Recommendations for Outpatient Follow-up:  1. Follow up with PCP in 1-2 weeks 2. Please obtain BMP/CBC in one week 3. She will follow up with cardiology for further management of A fib   Home Health:HHRN, PT, SW Equipment/Devices:  Discharge Condition:stable CODE STATUS: full Diet recommendation: Heart Healthy  Brief/Interim Summary: 66 year-old female with past medical history significant for COPD on home oxygen, atrial fibrillation, cancer, last infusion 01/24/2016 (paclitaxel and Gemzar). Patient presented to ED with shortness of breath and wheezing for past 24 hours prior to this admission. She was subsequently found to have rapid atrial fibrillation and cardiology has seen her in consultation.  Discharge Diagnoses:  Principal Problem:   Atrial fibrillation with rapid ventricular response (HCC) Active Problems:   Anemia   Pancreatic cancer (HCC)   COPD with acute exacerbation (HCC)   Hypertension   Anxiety   Chronic respiratory failure with hypoxia (HCC)   Malnutrition of moderate degree  Acute on chronic respiratory failure with hypoxia / acute COPD exacerbation - Patient was hypoxic on the admission with oxygen saturation 87% on room air but with nasal cannula oxygen support has improved to 93% - Chest x-ray showed COPD exacerbation without focal airspace disease - Continue oxygen support via nasal cannula to keep oxygen saturation above 90% - She was treated with bronchodilators, antibiotics and intravenous steroids. - She has completed a course of levaquin in the hospital  - She'll be discharged on a prednisone taper -She still has some mild wheezing on exam, mostly with forced expiration in the upper airways. I suspect some of this may be her baseline since she  does have advanced COPD. She is able to carry on a conversation without difficulty and is able to ambulate.  Tobacco abuse - Continue nicotine patch - Counseled on cessation  Anemia of chronic disease -Secondary to history of malignancy and sequela of chemotherapy - Hemoglobin 8.2. Continue to follow  Chronic kidney disease stage III - Recent baseline creatinine 1.27  - Creatinine on this admission within baseline range   Atrial fibrillation with RVR.  - CHADS vasc score 3  - Likely secondary to hypoxia - Rate being managed with Cardizem 90 mg by mouth every 6 hours and amiodarone drip. Amiodarone drip stopped 12/29 by cardiology  - heart rate is better today. Continue metoprolol and Cardizem - Continue xarelto   Pancreatic adenocarcinoma - Last chemotherapy 01/24/2016 - Follows with Kirby Crigler, Aware of pt admission   Leukocytosis  - Likely from prednisone   Moderate protein calorie malnutrition  - In the context of chronic illness, malignancy  - Continue nutritional supplementation as recommended by nutritionist   Depression - Continue Lexapro  Generalized weakness. -Seen by physical therapy and recommendations were for home health.  Discharge Instructions  Discharge Instructions    Diet - low sodium heart healthy    Complete by:  As directed    Increase activity slowly    Complete by:  As directed      Allergies as of 02/05/2016      Reactions   Ibuprofen Nausea And Vomiting   Penicillins Swelling   Has patient had a PCN reaction causing immediate rash, facial/tongue/throat swelling, SOB or lightheadedness with hypotension: Yes Has patient had a PCN reaction causing severe rash involving mucus membranes or skin necrosis: No  Has patient had a PCN reaction that required hospitalization No Has patient had a PCN reaction occurring within the last 10 years: No If all of the above answers are "NO", then may proceed with Cephalosporin use.   Tiotropium  Bromide Monohydrate Itching   Tramadol Nausea And Vomiting      Medication List    STOP taking these medications   albuterol (2.5 MG/3ML) 0.083% nebulizer solution Commonly known as:  PROVENTIL     TAKE these medications   ABRAXANE IV Inject into the vein. Day 1, day 8, day 15, every 28 days   COMBIVENT RESPIMAT 20-100 MCG/ACT Aers respimat Generic drug:  Ipratropium-Albuterol Inhale 1 puff into the lungs 2 (two) times daily.   diltiazem 360 MG 24 hr capsule Commonly known as:  CARDIZEM CD Take 1 capsule (360 mg total) by mouth daily. Start taking on:  02/06/2016 What changed:  medication strength  how much to take   escitalopram 10 MG tablet Commonly known as:  LEXAPRO Take 1 tablet (10 mg total) by mouth every evening.   furosemide 20 MG tablet Commonly known as:  LASIX Take 1 tablet (20 mg total) by mouth daily as needed for edema. What changed:  reasons to take this   GEMZAR IV Inject into the vein. Day 1, day 8, day 15, every 28 days   guaiFENesin 100 MG/5ML Soln Commonly known as:  ROBITUSSIN Take 10 mLs (200 mg total) by mouth every 4 (four) hours as needed for cough or to loosen phlegm.   HYDROcodone-acetaminophen 5-325 MG tablet Commonly known as:  NORCO/VICODIN Take 1 tablet by mouth every 6 (six) hours as needed for moderate pain (Must last 30 days.Do not take and drive a car or use machinery.).   ipratropium 0.02 % nebulizer solution Commonly known as:  ATROVENT Take 2.5 mLs (0.5 mg total) by nebulization 3 (three) times daily.   levalbuterol 1.25 MG/0.5ML nebulizer solution Commonly known as:  XOPENEX Take 1.25 mg by nebulization every 4 (four) hours as needed for wheezing or shortness of breath.   lidocaine-prilocaine cream Commonly known as:  EMLA Apply 1 application topically daily as needed (port access).   metoprolol tartrate 25 MG tablet Commonly known as:  LOPRESSOR Take 1 tablet (25 mg total) by mouth 2 (two) times daily. What  changed:  medication strength  how much to take   nicotine 14 mg/24hr patch Commonly known as:  NICODERM CQ - dosed in mg/24 hours Place 1 patch (14 mg total) onto the skin daily.   ondansetron 8 MG tablet Commonly known as:  ZOFRAN Take 1 tablet by mouth daily as needed for nausea/vomiting.   pantoprazole 40 MG tablet Commonly known as:  PROTONIX Take 1 tablet (40 mg total) by mouth daily.   potassium chloride SA 20 MEQ tablet Commonly known as:  K-DUR,KLOR-CON Take 2 tablets (40 mEq total) by mouth 2 (two) times daily.   predniSONE 10 MG tablet Commonly known as:  DELTASONE Take 40mg  po daily for 3 days then 30mg  daily for 3 days then 20mg  daily for 3 days then 10mg  daily for 3 days What changed:  medication strength  how much to take  how to take this  when to take this  additional instructions   prochlorperazine 10 MG tablet Commonly known as:  COMPAZINE Take 1 tablet by mouth daily as needed for nausea/vomiting.   rivaroxaban 20 MG Tabs tablet Commonly known as:  XARELTO Take 1 tablet (20 mg total) by mouth daily with  supper.   SYMBICORT 160-4.5 MCG/ACT inhaler Generic drug:  budesonide-formoterol Inhale 1 puff into the lungs 2 (two) times daily.      Follow-up Information    Jory Sims, NP On 02/15/2016.   Specialties:  Nurse Practitioner, Radiology, Cardiology Why:  at 3:30 pm Contact information: [place] [id number] [phone]          Allergies  Allergen Reactions  . Ibuprofen Nausea And Vomiting  . Penicillins Swelling    Has patient had a PCN reaction causing immediate rash, facial/tongue/throat swelling, SOB or lightheadedness with hypotension: Yes Has patient had a PCN reaction causing severe rash involving mucus membranes or skin necrosis: No Has patient had a PCN reaction that required hospitalization No Has patient had a PCN reaction occurring within the last 10 years: No If all of the above answers are  "NO", then may proceed with Cephalosporin use.   . Tiotropium Bromide Monohydrate Itching  . Tramadol Nausea And Vomiting    Consultations:  cardiology   Procedures/Studies: Dg Chest 2 View  Result Date: 01/20/2016 CLINICAL DATA:  Productive cough and shortness of breath for 2 weeks. EXAM: CHEST  2 VIEW COMPARISON:  12/24/2015 chest radiographs and CTA FINDINGS: Right jugular Port-A-Cath remains in place with tip terminating over the lower SVC. The cardiomediastinal silhouette is within normal limits. Aortic atherosclerosis is noted. The lungs remain hyperinflated with a similar appearance of biapical scarring including calcification in the right apex. There is no evidence of acute airspace consolidation, edema, pleural effusion, or pneumothorax. Prior cervical spine fusion. IMPRESSION: 1. No active cardiopulmonary disease. 2. Aortic atherosclerosis. Electronically Signed   By: Logan Bores M.D.   On: 01/20/2016 21:33   Dg Chest Port 1 View  Result Date: 01/31/2016 CLINICAL DATA:  Cough, CHF EXAM: PORTABLE CHEST 1 VIEW COMPARISON:  01/28/2016 FINDINGS: Right Port-A-Cath remains in place, unchanged. Scarring in the apices bilaterally. No other confluent airspace opacities. Heart is normal size. No effusions or acute bony abnormality. IMPRESSION: Biapical scarring.  No active disease. Electronically Signed   By: Rolm Baptise M.D.   On: 01/31/2016 10:16   Dg Chest Port 1 View  Result Date: 01/28/2016 CLINICAL DATA:  Shortness of breath wheezing and cough EXAM: PORTABLE CHEST 1 VIEW COMPARISON:  Chest radiograph 01/20/2016 CT chest 12/24/2015 FINDINGS: Right chest wall Port-A-Cath tip is at the cavoatrial junction. Cardiomediastinal contours are normal. There is no focal airspace consolidation or pulmonary edema. The lungs are hyperexpanded. No pneumothorax or pleural effusion. IMPRESSION: COPD without focal airspace disease. Electronically Signed   By: Ulyses Jarred M.D.   On: 01/28/2016 02:07        Subjective: Shortness of breath is getting better. Continues to cough  Discharge Exam: Vitals:   02/05/16 1000 02/05/16 1446  BP: (!) 126/55 (!) 114/49  Pulse: 74 84  Resp:    Temp:  98.4 F (36.9 C)   Vitals:   02/05/16 0800 02/05/16 1000 02/05/16 1432 02/05/16 1446  BP:  (!) 126/55  (!) 114/49  Pulse:  74  84  Resp:      Temp:    98.4 F (36.9 C)  TempSrc:    Oral  SpO2: 98%  98% 99%  Weight:      Height:        General: Pt is alert, awake, not in acute distress Cardiovascular: irregular, S1/S2 +, no rubs, no gallops Respiratory: mild wheeze bilaterally, mostly in upper airways with forced expiration Abdominal: Soft, NT, ND, bowel  sounds + Extremities: no edema, no cyanosis    The results of significant diagnostics from this hospitalization (including imaging, microbiology, ancillary and laboratory) are listed below for reference.     Microbiology: Recent Results (from the past 240 hour(s))  MRSA PCR Screening     Status: Abnormal   Collection Time: 01/28/16  6:53 AM  Result Value Ref Range Status   MRSA by PCR INVALID RESULTS, SPECIMEN SENT FOR CULTURE (A) NEGATIVE Final    Comment: Results Called to: HYLTON L. AT 1000A ON [id number] BY THOMPSON S.        The GeneXpert MRSA Assay (FDA approved for NASAL specimens only), is one component of a comprehensive MRSA colonization surveillance program. It is not intended to diagnose MRSA infection nor to guide or monitor treatment for MRSA infections.   MRSA culture     Status: None   Collection Time: 01/28/16  6:53 AM  Result Value Ref Range Status   Specimen Description NOSE  Final   Special Requests NONE  Final   Culture   Final    NO MRSA DETECTED Performed at [facility]    Report Status 01/30/2016 FINAL  Final  Culture, group A strep     Status: None   Collection Time: 01/29/16 10:30 AM  Result Value Ref Range Status   Specimen Description THROAT  Final   Special Requests NONE   Final   Culture   Final    NO GROUP A STREP (S.PYOGENES) ISOLATED Performed at [facility]    Report Status 01/31/2016 FINAL  Final  Rapid strep screen (not at [facility])     Status: None   Collection Time: 01/29/16 10:38 AM  Result Value Ref Range Status   Streptococcus, Group A Screen (Direct) NEGATIVE NEGATIVE Final     Labs: BNP (last 3 results)  Recent Labs  06/27/15 1537 07/17/15 0358 01/28/16 0119  BNP 880.0* 415.0* [id number]*   Basic Metabolic Panel:  Recent Labs Lab 01/30/16 0456 02/01/16 0451 02/03/16 0431 02/04/16 0533  NA 132* 134* 134*  --   K 4.7 4.7 3.9  --   CL 100* 99* 101  --   CO2 24 26 27   --   GLUCOSE 138* 138* 124*  --   BUN 35* 30* 34*  --   CREATININE 1.09* 0.96 1.08* 0.97  CALCIUM 8.6* 8.4* 8.0*  --    Liver Function Tests: No results for input(s): AST, ALT, ALKPHOS, BILITOT, PROT, ALBUMIN in the last 168 hours. No results for input(s): LIPASE, AMYLASE in the last 168 hours. No results for input(s): AMMONIA in the last 168 hours. CBC:  Recent Labs Lab 01/30/16 0456 01/31/16 1616 02/01/16 0451 02/03/16 0431  WBC 13.0* 13.7* 11.0* 12.0*  HGB 8.0* 8.7* 8.1* 8.2*  HCT 25.6* 27.4* 26.5* 26.5*  MCV 84.2 84.0 84.1 84.4  PLT 363 323 273 223   Cardiac Enzymes: No results for input(s): CKTOTAL, CKMB, CKMBINDEX, TROPONINI in the last 168 hours. BNP: Invalid input(s): POCBNP CBG:  Recent Labs Lab 02/01/16 2024  GLUCAP 155*   D-Dimer No results for input(s): DDIMER in the last 72 hours. Hgb A1c No results for input(s): HGBA1C in the last 72 hours. Lipid Profile No results for input(s): CHOL, HDL, LDLCALC, TRIG, CHOLHDL, LDLDIRECT in the last 72 hours. Thyroid function studies No results for input(s): TSH, T4TOTAL, T3FREE, THYROIDAB in the last 72 hours.  Invalid input(s): FREET3 Anemia work up No results for input(s): VITAMINB12, FOLATE, FERRITIN, TIBC, IRON, RETICCTPCT  in the last 72 hours. Urinalysis    Component Value  Date/Time   COLORURINE YELLOW 12/24/2015 1612   APPEARANCEUR CLEAR 12/24/2015 1612   LABSPEC 1.025 12/24/2015 1612   PHURINE 6.0 12/24/2015 1612   GLUCOSEU NEGATIVE 12/24/2015 1612   HGBUR MODERATE (A) 12/24/2015 1612   BILIRUBINUR NEGATIVE 12/24/2015 1612   KETONESUR NEGATIVE 12/24/2015 1612   PROTEINUR NEGATIVE 12/24/2015 1612   NITRITE NEGATIVE 12/24/2015 1612   LEUKOCYTESUR SMALL (A) 12/24/2015 1612   Sepsis Labs Invalid input(s): PROCALCITONIN,  WBC,  LACTICIDVEN Microbiology Recent Results (from the past 240 hour(s))  MRSA PCR Screening     Status: Abnormal   Collection Time: 01/28/16  6:53 AM  Result Value Ref Range Status   MRSA by PCR INVALID RESULTS, SPECIMEN SENT FOR CULTURE (A) NEGATIVE Final    Comment: Results Called to: HYLTON L. AT 1000A ON [id number] BY THOMPSON S.        The GeneXpert MRSA Assay (FDA approved for NASAL specimens only), is one component of a comprehensive MRSA colonization surveillance program. It is not intended to diagnose MRSA infection nor to guide or monitor treatment for MRSA infections.   MRSA culture     Status: None   Collection Time: 01/28/16  6:53 AM  Result Value Ref Range Status   Specimen Description NOSE  Final   Special Requests NONE  Final   Culture   Final    NO MRSA DETECTED Performed at [facility]    Report Status 01/30/2016 FINAL  Final  Culture, group A strep     Status: None   Collection Time: 01/29/16 10:30 AM  Result Value Ref Range Status   Specimen Description THROAT  Final   Special Requests NONE  Final   Culture   Final    NO GROUP A STREP (S.PYOGENES) ISOLATED Performed at [facility]    Report Status 01/31/2016 FINAL  Final  Rapid strep screen (not at [facility])     Status: None   Collection Time: 01/29/16 10:38 AM  Result Value Ref Range Status   Streptococcus, Group A Screen (Direct) NEGATIVE NEGATIVE Final     Time coordinating discharge: Over 30  minutes  SIGNED:   Kathie Dike, MD  [facility] 02/05/2016, 3:50 PM Pager   If 7PM-7AM, please contact night-coverage [email or web address] Password [email or web address]

## 2016-02-05 NOTE — Care Management Important Message (Signed)
Important Message  Patient Details  Name: Lindsay Salazar MRN: [id number] Date of Birth: 1950-07-31   Medicare Important Message Given:  Yes    Rigoberto Repass, Chauncey Reading, RN 02/05/2016, 4:18 PM

## 2016-02-06 ENCOUNTER — Ambulatory Visit (HOSPITAL_COMMUNITY): Payer: Medicare HMO

## 2016-02-07 ENCOUNTER — Emergency Department (HOSPITAL_COMMUNITY)
Admission: EM | Admit: 2016-02-07 | Discharge: 2016-02-07 | Disposition: A | Discharge status: Home / Self Care | Payer: Medicare HMO | Attending: Emergency Medicine | Admitting: Emergency Medicine

## 2016-02-07 ENCOUNTER — Other Ambulatory Visit: Payer: Self-pay

## 2016-02-07 ENCOUNTER — Emergency Department (HOSPITAL_COMMUNITY): Payer: Medicare HMO

## 2016-02-07 ENCOUNTER — Encounter (HOSPITAL_COMMUNITY): Payer: Self-pay | Admitting: Emergency Medicine

## 2016-02-07 ENCOUNTER — Ambulatory Visit: Payer: Medicare HMO | Admitting: Orthopaedic Surgery

## 2016-02-07 DIAGNOSIS — J45909 Unspecified asthma, uncomplicated: Secondary | ICD-10-CM | POA: Insufficient documentation

## 2016-02-07 DIAGNOSIS — Z87891 Personal history of nicotine dependence: Secondary | ICD-10-CM | POA: Insufficient documentation

## 2016-02-07 DIAGNOSIS — J441 Chronic obstructive pulmonary disease with (acute) exacerbation: Secondary | ICD-10-CM | POA: Insufficient documentation

## 2016-02-07 DIAGNOSIS — I10 Essential (primary) hypertension: Secondary | ICD-10-CM | POA: Diagnosis not present

## 2016-02-07 DIAGNOSIS — C251 Malignant neoplasm of body of pancreas: Secondary | ICD-10-CM | POA: Insufficient documentation

## 2016-02-07 DIAGNOSIS — R109 Unspecified abdominal pain: Secondary | ICD-10-CM | POA: Diagnosis present

## 2016-02-07 DIAGNOSIS — Z79899 Other long term (current) drug therapy: Secondary | ICD-10-CM | POA: Insufficient documentation

## 2016-02-07 LAB — URINALYSIS, ROUTINE W REFLEX MICROSCOPIC
BILIRUBIN URINE: NEGATIVE
GLUCOSE, UA: NEGATIVE mg/dL
HGB URINE DIPSTICK: NEGATIVE
KETONES UR: NEGATIVE mg/dL
Nitrite: NEGATIVE
PH: 7 (ref 5.0–8.0)
PROTEIN: NEGATIVE mg/dL
Specific Gravity, Urine: 1.015 (ref 1.005–1.030)

## 2016-02-07 LAB — COMPREHENSIVE METABOLIC PANEL
ALBUMIN: 3.4 g/dL — AB (ref 3.5–5.0)
ALK PHOS: 67 U/L (ref 38–126)
ALT: 26 U/L (ref 14–54)
ANION GAP: 5 (ref 5–15)
AST: 23 U/L (ref 15–41)
BILIRUBIN TOTAL: 0.5 mg/dL (ref 0.3–1.2)
BUN: 20 mg/dL (ref 6–20)
CALCIUM: 8.6 mg/dL — AB (ref 8.9–10.3)
CO2: 29 mmol/L (ref 22–32)
Chloride: 103 mmol/L (ref 101–111)
Creatinine, Ser: 0.91 mg/dL (ref 0.44–1.00)
GFR calc Af Amer: >60 mL/min (ref >60)
GLUCOSE: 96 mg/dL (ref 65–99)
POTASSIUM: 4.2 mmol/L (ref 3.5–5.1)
Sodium: 137 mmol/L (ref 135–145)
TOTAL PROTEIN: 6.1 g/dL — AB (ref 6.5–8.1)

## 2016-02-07 LAB — URINALYSIS, MICROSCOPIC (REFLEX)
BACTERIA UA: NONE SEEN
RBC / HPF: NONE SEEN RBC/hpf (ref 0–5)

## 2016-02-07 LAB — CBC WITH DIFFERENTIAL/PLATELET
Basophils Absolute: 0 10*3/uL (ref 0.0–0.1)
Basophils Relative: 0 %
Eosinophils Absolute: 0 10*3/uL (ref 0.0–0.7)
Eosinophils Relative: 0 %
HCT: 30.3 % — ABNORMAL LOW (ref 36.0–46.0)
Hemoglobin: 9.3 g/dL — ABNORMAL LOW (ref 12.0–15.0)
Lymphocytes Relative: 20 %
Lymphs Abs: 3.2 10*3/uL (ref 0.7–4.0)
MCH: 26.4 pg (ref 26.0–34.0)
MCHC: 30.7 g/dL (ref 30.0–36.0)
MCV: 86.1 fL (ref 78.0–100.0)
Monocytes Absolute: 2.4 10*3/uL — ABNORMAL HIGH (ref 0.1–1.0)
Monocytes Relative: 15 %
Neutro Abs: 10.3 10*3/uL — ABNORMAL HIGH (ref 1.7–7.7)
Neutrophils Relative %: 65 %
Platelets: 259 10*3/uL (ref 150–400)
RBC: 3.52 MIL/uL — ABNORMAL LOW (ref 3.87–5.11)
RDW: 23.2 % — ABNORMAL HIGH (ref 11.5–15.5)
WBC: 15.9 10*3/uL — ABNORMAL HIGH (ref 4.0–10.5)

## 2016-02-07 LAB — TROPONIN I: Troponin I: <0.03 ng/mL (ref <0.03)

## 2016-02-07 LAB — LIPASE, BLOOD: Lipase: 23 U/L (ref 11–51)

## 2016-02-07 MED ORDER — MORPHINE SULFATE (PF) 4 MG/ML IV SOLN
4.0000 mg | Freq: Once | INTRAVENOUS | Status: AC
  Administered 2016-02-07: 4 mg via INTRAVENOUS
  Filled 2016-02-07: qty 1

## 2016-02-07 MED ORDER — ALBUTEROL SULFATE (2.5 MG/3ML) 0.083% IN NEBU
2.5000 mg | INHALATION_SOLUTION | Freq: Once | RESPIRATORY_TRACT | Status: AC
  Administered 2016-02-07: 2.5 mg via RESPIRATORY_TRACT
  Filled 2016-02-07: qty 3

## 2016-02-07 MED ORDER — ONDANSETRON HCL 4 MG/2ML IJ SOLN
4.0000 mg | Freq: Once | INTRAMUSCULAR | Status: DC
  Filled 2016-02-07: qty 2

## 2016-02-07 MED ORDER — HYDROCODONE-ACETAMINOPHEN 5-325 MG PO TABS: 1.0000 | ORAL_TABLET | ORAL | 0 refills | Status: DC | PRN

## 2016-02-07 MED ORDER — SODIUM CHLORIDE 0.9 % IV BOLUS (SEPSIS)
1000.0000 mL | Freq: Once | INTRAVENOUS | Status: AC
  Administered 2016-02-07: 1000 mL via INTRAVENOUS

## 2016-02-07 MED ORDER — METHYLPREDNISOLONE SODIUM SUCC 125 MG IJ SOLR
125.0000 mg | Freq: Once | INTRAMUSCULAR | Status: AC
  Administered 2016-02-07: 125 mg via INTRAVENOUS
  Filled 2016-02-07: qty 2

## 2016-02-07 NOTE — ED Provider Notes (Signed)
[facility] DEPT Provider Note   CSN: [id number] Arrival date & time: 02/07/16  1116  By signing my name below, I, Hilbert Odor, attest that this documentation has been prepared under the direction and in the presence of Isla Pence, MD. Electronically Signed: Hilbert Odor, Scribe. 02/07/16. 11:26 AM. History   Chief Complaint Chief Complaint  Patient presents with  . Abdominal Pain      The history is provided by the patient. No language interpreter was used.    HPI Comments: Lindsay Salazar is a 66 y.o. female who presents to the Emergency Department complaining of abdominal pain that began PTA. Patient believes this could be related to her pancreatic cancer which she is currently taking chemo therapy. Her most recent treatment was 12/21. She denies vomiting, dyspnea, and dysuria. Per triage note: Patient states that her stomach burns when she takes deeps breaths.  Pt also reports sob.    Pt admitted from 12/25-1/2 with a COPD exacerbation and afib with rvr.  (CHADVASC score of 3).  Pt is on Xarelto.  She did get a course of levaquin in the hospital.  Pt has no pain medicine at home for her cancer pain.   Past Medical History:  Diagnosis Date  . Anginal pain ([place])    thought she was having   heartburn  . Arthritis    bursitis , fibromyalgia  . Arthritis    "right hip" (11/27/2015)  . Asthma   . Atrial fibrillation ([place])   . Chronic bronchitis ([place])   . Chronic right hip pain   . COPD (chronic obstructive pulmonary disease) ([place])    wears home o2 prn  . Daily headache    "last 2-3 months" (11/27/2015)  . Exposure to TB    "mom had it when she was pregnant with me"  . Fibromyalgia   . GERD (gastroesophageal reflux disease)    use to have it but not now  . Heart murmur    61-  54 years old  . Hypertension   . On home oxygen therapy    11/27/2015 "whenever I need it; don't know how many liters"  . Pancreatic cancer ([place])   . Pneumonia    "several times"  (11/27/2015)  . Pneumothorax     Patient Active Problem List   Diagnosis Date Noted  . Chronic respiratory failure with hypoxia ([place]) 01/29/2016  . Malnutrition of moderate degree 01/29/2016  . Atrial fibrillation with rapid ventricular response ([place]) 01/28/2016  . Hypertension 01/28/2016  . Anxiety 01/28/2016  . Iron deficiency anemia 01/24/2016  . B12 deficiency 01/24/2016  . COPD with acute exacerbation ([place]) 01/20/2016  . COPD exacerbation ([place]) 01/20/2016  . Pancreatic cancer ([place]) 12/07/2015  . Epigastric pain   . Abdominal pain 11/27/2015  . Pancreatic mass 11/27/2015  . Hypokalemia 11/27/2015  . Leukocytosis 11/27/2015  . Chronic anticoagulation-Xarelto 09/06/2015  . Gastrointestinal hemorrhage with melena 08/08/2015  . Acute blood loss anemia 08/08/2015  . Chronic obstructive pulmonary disease (COPD) ([place]) 08/08/2015  . Chest pain 08/08/2015  . Dyspnea   . Elevated troponin I level 07/17/2015  . Hyperglycemia 07/17/2015  . Anemia 07/17/2015  . Atrial fibrillation ([place]) 07/17/2015  . Atrial flutter ([place]) 06/27/2015  . Atrial fibrillation with RVR ([place]) 06/27/2015  . COPD with exacerbation ([place]) 06/27/2015  . Hypertensive urgency 06/27/2015  . Tobacco abuse 06/27/2015  . Elevated troponin 06/27/2015  . CAP (community acquired pneumonia)   . OSTEOPOROSIS 04/10/2009  . WEIGHT LOSS 04/10/2009    Past  Surgical History:  Procedure Laterality Date  . ANTERIOR CERVICAL DECOMP/DISCECTOMY FUSION     "put 4 screws in"  . BACK SURGERY    . CARDIAC CATHETERIZATION  08/2004   Archie Endo 06/18/2010  . CARDIOVERSION N/A 07/18/2015   Procedure: CARDIOVERSION;  Surgeon: Josue Hector, MD;  Location: [facility];  Service: Cardiovascular;  Laterality: N/A;  . ESOPHAGOGASTRODUODENOSCOPY N/A 08/09/2015   Procedure: ESOPHAGOGASTRODUODENOSCOPY (EGD);  Surgeon: Rogene Houston, MD;  Location: [facility] ENDO SUITE;  Service: Endoscopy;  Laterality: N/A;  . EUS N/A 11/29/2015   Procedure:  UPPER ENDOSCOPIC ULTRASOUND (EUS) RADIAL;  Surgeon: Milus Banister, MD;  Location: [facility] ENDOS[facility];  Service: Endoscopy;  Laterality: N/A;  . IR GENERIC HISTORICAL  12/18/2015   IR US GUIDE VASC ACCESS RIGHT 12/18/2015 Sandi Mariscal, MD [facility]  . IR GENERIC HISTORICAL  12/18/2015   IR FLUORO GUIDE PORT INSERTION RIGHT 12/18/2015 Sandi Mariscal, MD [facility]  . TONSILLECTOMY    . TUBAL LIGATION      OB History    Gravida Para Term Preterm AB Living   2         2   SAB TAB Ectopic Multiple Live Births                   Home Medications    Prior to Admission medications   Medication Sig Start Date End Date Taking? Authorizing Provider  COMBIVENT RESPIMAT 20-100 MCG/ACT AERS respimat Inhale 1 puff into the lungs 2 (two) times daily. 12/03/15  Yes Ripudeep Krystal Eaton, MD  diltiazem (CARDIZEM CD) 360 MG 24 hr capsule Take 1 capsule (360 mg total) by mouth daily. 02/06/16  Yes Kathie Dike, MD  escitalopram (LEXAPRO) 10 MG tablet Take 1 tablet (10 mg total) by mouth every evening. 12/03/15  Yes Ripudeep Krystal Eaton, MD  furosemide (LASIX) 20 MG tablet Take 1 tablet (20 mg total) by mouth daily as needed for edema. Patient taking differently: Take 20 mg by mouth daily as needed for fluid or edema.  07/06/15  Yes Arnoldo Lenis, MD  guaiFENesin (ROBITUSSIN) 100 MG/5ML SOLN Take 10 mLs (200 mg total) by mouth every 4 (four) hours as needed for cough or to loosen phlegm. 01/23/16  Yes Orvan Falconer, MD  ipratropium (ATROVENT) 0.02 % nebulizer solution Take 2.5 mLs (0.5 mg total) by nebulization 3 (three) times daily. 01/23/16  Yes Orvan Falconer, MD  levalbuterol Penne Lash) 1.25 MG/0.5ML nebulizer solution Take 1.25 mg by nebulization every 4 (four) hours as needed for wheezing or shortness of breath. 02/05/16  Yes Kathie Dike, MD  lidocaine-prilocaine (EMLA) cream Apply 1 application topically daily as needed (port access).  12/10/15  Yes Historical Provider, MD  metoprolol tartrate (LOPRESSOR) 25 MG tablet Take 1  tablet (25 mg total) by mouth 2 (two) times daily. 02/05/16  Yes Kathie Dike, MD  nicotine (NICODERM CQ - DOSED IN MG/24 HOURS) 14 mg/24hr patch Place 1 patch (14 mg total) onto the skin daily. 12/03/15  Yes Ripudeep K Rai, MD  ondansetron (ZOFRAN) 8 MG tablet Take 1 tablet by mouth daily as needed for nausea/vomiting. 12/10/15  Yes Historical Provider, MD  pantoprazole (PROTONIX) 40 MG tablet Take 1 tablet (40 mg total) by mouth daily. 12/03/15  Yes Ripudeep Krystal Eaton, MD  potassium chloride SA (K-DUR,KLOR-CON) 20 MEQ tablet Take 2 tablets (40 mEq total) by mouth 2 (two) times daily. 01/02/16 04/01/16 Yes Manon Hilding Kefalas, PA-C  predniSONE (DELTASONE) 10 MG tablet Take 40mg  po daily  for 3 days then 30mg  daily for 3 days then 20mg  daily for 3 days then 10mg  daily for 3 days 02/05/16  Yes Kathie Dike, MD  prochlorperazine (COMPAZINE) 10 MG tablet Take 1 tablet by mouth daily as needed for nausea/vomiting. 12/10/15  Yes Historical Provider, MD  rivaroxaban (XARELTO) 20 MG TABS tablet Take 1 tablet (20 mg total) by mouth daily with supper. 07/19/15  Yes Reyne Dumas, MD  SYMBICORT 160-4.5 MCG/ACT inhaler Inhale 1 puff into the lungs 2 (two) times daily. 12/03/15  Yes Ripudeep Krystal Eaton, MD  Gemcitabine HCl (GEMZAR IV) Inject into the vein. Day 1, day 8, day 15, every 28 days    Historical Provider, MD  HYDROcodone-acetaminophen (NORCO/VICODIN) 5-325 MG tablet Take 1 tablet by mouth every 4 (four) hours as needed. 02/07/16   Isla Pence, MD  PACLitaxel Protein-Bound Part (ABRAXANE IV) Inject into the vein. Day 1, day 8, day 15, every 28 days    Historical Provider, MD    Family History Family History  Problem Relation Age of Onset  . COPD Mother   . Diabetes Father   . Stroke Father     Social History Social History  Substance Use Topics  . Smoking status: Former Smoker    Packs/day: 0.50    Years: 46.00    Types: Cigarettes    Start date: 10/09/1969    Quit date: 11/29/2015  . Smokeless tobacco:  Never Used  . Alcohol use No     Comment: quit 30 years ago     Allergies   Ibuprofen; Penicillins; Tiotropium bromide monohydrate; and Tramadol   Review of Systems Review of Systems  Respiratory: Positive for shortness of breath.   Gastrointestinal: Positive for abdominal pain (Epigastric). Negative for vomiting.  Genitourinary: Negative for dysuria.  All other systems reviewed and are negative.    Physical Exam Updated Vital Signs BP 101/61   Pulse 74   Temp 97.4 F (36.3 C) (Oral)   Resp 18   Ht 5\' 4"  (1.626 m)   Wt 128 lb (58.1 kg)   SpO2 100%   BMI 21.97 kg/m   Physical Exam  Constitutional: She is oriented to person, place, and time. She appears well-developed. No distress.  HENT:  Head: Normocephalic and atraumatic.  Right Ear: External ear normal.  Left Ear: External ear normal.  Nose: Nose normal.  Mouth/Throat: Mucous membranes are dry.  Poor dentition  Eyes: Conjunctivae and EOM are normal. Pupils are equal, round, and reactive to light.  Neck: Normal range of motion. Neck supple.  Cardiovascular: Normal rate.  An irregularly irregular rhythm present. Exam reveals no gallop and no friction rub.   No murmur heard. Pulmonary/Chest: Tachypnea noted. She has wheezes.  Abdominal: There is tenderness in the epigastric area.  Epigastric pain.  Musculoskeletal: Normal range of motion.  Neurological: She is alert and oriented to person, place, and time.  Skin: Skin is warm.  Psychiatric: She has a normal mood and affect. Her behavior is normal. Judgment and thought content normal.  Nursing note and vitals reviewed.    ED Treatments / Results  DIAGNOSTIC STUDIES: Oxygen Saturation is 99% on RA, normal by my interpretation.    COORDINATION OF CARE: 11:26 AM Discussed treatment plan with pt at bedside and pt agreed to plan.  Labs (all labs ordered are listed, but only abnormal results are displayed) Labs Reviewed  COMPREHENSIVE METABOLIC PANEL -  Abnormal; Notable for the following:       Result Value  Calcium 8.6 (*)    Total Protein 6.1 (*)    Albumin 3.4 (*)    All other components within normal limits  CBC WITH DIFFERENTIAL/PLATELET - Abnormal; Notable for the following:    WBC 15.9 (*)    RBC 3.52 (*)    Hemoglobin 9.3 (*)    HCT 30.3 (*)    RDW 23.2 (*)    Neutro Abs 10.3 (*)    Monocytes Absolute 2.4 (*)    All other components within normal limits  URINALYSIS, ROUTINE W REFLEX MICROSCOPIC - Abnormal; Notable for the following:    APPearance HAZY (*)    Leukocytes, UA TRACE (*)    All other components within normal limits  URINALYSIS, MICROSCOPIC (REFLEX) - Abnormal; Notable for the following:    Squamous Epithelial / LPF 0-5 (*)    All other components within normal limits  TROPONIN I  LIPASE, BLOOD    EKG  EKG Interpretation None      ekg not coming through on MUSE  Hr 81.  Afib.  No st or t wave changes. Radiology Dg Chest 2 View  Result Date: 02/07/2016 CLINICAL DATA:  Pancreatic carcinoma. Shortness of breath with exertion EXAM: CHEST  2 VIEW COMPARISON:  January 31, 2016 FINDINGS: Scarring in each lung apex region is stable. There is no edema or consolidation. Heart size and pulmonary vascularity are normal. No adenopathy. There is atherosclerotic calcification in the aorta. Port-A-Cath tip is at the cavoatrial junction. No pneumothorax. There is postoperative change in the lower cervical spine. There are no blastic or lytic bone lesions. IMPRESSION: Stable scarring in each lung apex. No edema or consolidation. No evident adenopathy. There is aortic atherosclerosis. Electronically Signed   By: Lowella Grip III M.D.   On: 02/07/2016 13:46    Procedures Procedures (including critical care time)  Medications Ordered in ED Medications  ondansetron (ZOFRAN) injection 4 mg (4 mg Intravenous Not Given 02/07/16 1208)  sodium chloride 0.9 % bolus 1,000 mL (0 mLs Intravenous Stopped 02/07/16 1323)    morphine 4 MG/ML injection 4 mg (4 mg Intravenous Given 02/07/16 1208)  albuterol (PROVENTIL) (2.5 MG/3ML) 0.083% nebulizer solution 2.5 mg (2.5 mg Nebulization Given 02/07/16 1449)  morphine 4 MG/ML injection 4 mg (4 mg Intravenous Given 02/07/16 1518)  methylPREDNISolone sodium succinate (SOLU-MEDROL) 125 mg/2 mL injection 125 mg (125 mg Intravenous Given 02/07/16 1518)     Initial Impression / Assessment and Plan / ED Course  I have reviewed the triage vital signs and the nursing notes.  Pertinent labs & imaging results that were available during my care of the patient were reviewed by me and considered in my medical decision making (see chart for details).  Clinical Course    Pt is feeling much better, but now she is more sob.  She was given solumedrol and 1 albuterol neb and that sob is better, and is back to baseline.  She requested some more pain medication prior to d/c and so she will be given another dose of morphine.    Pt given ten, 5 mg lortabs for pain.  She is told that her pcp or oncologist needs to treat her chronic cancer pain.  She knows to return if worse.  Final Clinical Impressions(s) / ED Diagnoses   Final diagnoses:  COPD exacerbation ([place])  Malignant neoplasm of body of pancreas ([facility])    New Prescriptions New Prescriptions   HYDROCODONE-ACETAMINOPHEN (NORCO/VICODIN) 5-325 MG TABLET    Take 1 tablet by mouth every  4 (four) hours as needed.   I personally performed the services described in this documentation, which was scribed in my presence. The recorded information has been reviewed and is accurate.    Isla Pence, MD 02/07/16 [id number]

## 2016-02-07 NOTE — ED Triage Notes (Signed)
Pt states when she takes a deep breath her stomach burns

## 2016-02-07 NOTE — ED Notes (Signed)
Rt aware of tx 

## 2016-02-07 NOTE — ED Notes (Signed)
Patient made aware a urine sample is needed. Patient states she needs to wait awhile will call when able to void.

## 2016-02-07 NOTE — Discharge Instructions (Signed)
Increase prednisone to 60 mg daily for the next 4 days.  PCP or oncologist to manage chronic pain.

## 2016-02-07 NOTE — ED Notes (Signed)
Pt taken to Xray.

## 2016-02-08 ENCOUNTER — Ambulatory Visit (HOSPITAL_COMMUNITY): Payer: Medicare HMO | Admitting: Oncology

## 2016-02-08 ENCOUNTER — Ambulatory Visit (HOSPITAL_COMMUNITY): Payer: Medicare HMO

## 2016-02-08 NOTE — Progress Notes (Deleted)
Unable to reach pt via telephone - voice mail message left for pt reminding her of her chemotherapy appt today.  Instructed to call the clinic to let us know she is okay, and to reschedule her treatment.

## 2016-02-11 ENCOUNTER — Emergency Department (HOSPITAL_COMMUNITY): Payer: Medicare HMO

## 2016-02-11 ENCOUNTER — Other Ambulatory Visit: Payer: Self-pay

## 2016-02-11 ENCOUNTER — Encounter (HOSPITAL_COMMUNITY): Payer: Self-pay | Admitting: Emergency Medicine

## 2016-02-11 ENCOUNTER — Emergency Department (HOSPITAL_COMMUNITY)
Admission: EM | Admit: 2016-02-11 | Discharge: 2016-02-11 | Disposition: A | Discharge status: Home / Self Care | Payer: Medicare HMO | Attending: Emergency Medicine | Admitting: Emergency Medicine

## 2016-02-11 DIAGNOSIS — Z87891 Personal history of nicotine dependence: Secondary | ICD-10-CM | POA: Insufficient documentation

## 2016-02-11 DIAGNOSIS — J45909 Unspecified asthma, uncomplicated: Secondary | ICD-10-CM | POA: Diagnosis not present

## 2016-02-11 DIAGNOSIS — Z8507 Personal history of malignant neoplasm of pancreas: Secondary | ICD-10-CM | POA: Insufficient documentation

## 2016-02-11 DIAGNOSIS — I10 Essential (primary) hypertension: Secondary | ICD-10-CM | POA: Diagnosis not present

## 2016-02-11 DIAGNOSIS — Z79899 Other long term (current) drug therapy: Secondary | ICD-10-CM | POA: Diagnosis not present

## 2016-02-11 DIAGNOSIS — R0602 Shortness of breath: Secondary | ICD-10-CM | POA: Insufficient documentation

## 2016-02-11 DIAGNOSIS — R05 Cough: Secondary | ICD-10-CM | POA: Insufficient documentation

## 2016-02-11 DIAGNOSIS — R079 Chest pain, unspecified: Secondary | ICD-10-CM | POA: Diagnosis not present

## 2016-02-11 DIAGNOSIS — J449 Chronic obstructive pulmonary disease, unspecified: Secondary | ICD-10-CM | POA: Diagnosis not present

## 2016-02-11 LAB — BASIC METABOLIC PANEL
Anion gap: 9 (ref 5–15)
BUN: 23 mg/dL — ABNORMAL HIGH (ref 6–20)
CO2: 27 mmol/L (ref 22–32)
Calcium: 8.6 mg/dL — ABNORMAL LOW (ref 8.9–10.3)
Chloride: 102 mmol/L (ref 101–111)
Creatinine, Ser: 0.89 mg/dL (ref 0.44–1.00)
GFR calc Af Amer: >60 mL/min (ref >60)
GFR calc non Af Amer: >60 mL/min (ref >60)
Glucose, Bld: 108 mg/dL — ABNORMAL HIGH (ref 65–99)
Potassium: 3.3 mmol/L — ABNORMAL LOW (ref 3.5–5.1)
Sodium: 138 mmol/L (ref 135–145)

## 2016-02-11 LAB — CBC
HCT: 33.7 % — ABNORMAL LOW (ref 36.0–46.0)
Hemoglobin: 10.4 g/dL — ABNORMAL LOW (ref 12.0–15.0)
MCH: 26.7 pg (ref 26.0–34.0)
MCHC: 30.9 g/dL (ref 30.0–36.0)
MCV: 86.6 fL (ref 78.0–100.0)
Platelets: 293 10*3/uL (ref 150–400)
RBC: 3.89 MIL/uL (ref 3.87–5.11)
RDW: 21.5 % — ABNORMAL HIGH (ref 11.5–15.5)
WBC: 12.9 10*3/uL — ABNORMAL HIGH (ref 4.0–10.5)

## 2016-02-11 LAB — TROPONIN I: Troponin I: <0.02 ng/mL (ref <0.03)

## 2016-02-11 MED ORDER — ONDANSETRON 4 MG PO TBDP
ORAL_TABLET | ORAL | Status: AC
  Administered 2016-02-11: 4 mg via ORAL
  Filled 2016-02-11: qty 1

## 2016-02-11 MED ORDER — HYDROMORPHONE HCL 1 MG/ML IJ SOLN
1.0000 mg | Freq: Once | INTRAMUSCULAR | Status: AC
  Administered 2016-02-11: 1 mg via INTRAVENOUS
  Filled 2016-02-11: qty 1

## 2016-02-11 MED ORDER — ONDANSETRON 4 MG PO TBDP
4.0000 mg | ORAL_TABLET | Freq: Once | ORAL | Status: AC
  Administered 2016-02-11: 4 mg via ORAL

## 2016-02-11 MED ORDER — ALBUTEROL SULFATE (2.5 MG/3ML) 0.083% IN NEBU
2.5000 mg | INHALATION_SOLUTION | Freq: Once | RESPIRATORY_TRACT | Status: AC
  Administered 2016-02-11: 2.5 mg via RESPIRATORY_TRACT
  Filled 2016-02-11: qty 3

## 2016-02-11 NOTE — ED Provider Notes (Signed)
[facility] DEPT Provider Note   CSN: [id number] Arrival date & time: 02/11/16  1050  By signing my name below, I, Sonum Patel, attest that this documentation has been prepared under the direction and in the presence of Virgel Manifold, MD. Electronically Signed: Sonum Patel, Education administrator. 02/11/16. 11:58 AM. History   Chief Complaint Chief Complaint  Patient presents with  . Chest Pain  . Shortness of Breath    The history is provided by the patient. No language interpreter was used.     HPI Comments: Lindsay Salazar is a 66 y.o. female who presents to the Emergency Department complaining of persistent SOB for the past several days that worsened 8 hours ago. She reports associated palpitations and sharp chest pains that began at the same time last night. She also complains of abdominal pain, nausea, and left sided back pain that is worse than what she has at baseline. She is currently undergoing chemotherapy; last treatment was 2 weeks ago. She reports a history of Afib which was diagnosed in May 2017. She states she is complaint with her medications. She denies urinary symptoms, leg swelling.   Past Medical History:  Diagnosis Date  . Anginal pain ([place])    thought she was having   heartburn  . Arthritis    bursitis , fibromyalgia  . Arthritis    "right hip" (11/27/2015)  . Asthma   . Atrial fibrillation ([place])   . Chronic bronchitis ([place])   . Chronic right hip pain   . COPD (chronic obstructive pulmonary disease) ([place])    wears home o2 prn  . Daily headache    "last 2-3 months" (11/27/2015)  . Exposure to TB    "mom had it when she was pregnant with me"  . Fibromyalgia   . GERD (gastroesophageal reflux disease)    use to have it but not now  . Heart murmur    45-  33 years old  . Hypertension   . On home oxygen therapy    11/27/2015 "whenever I need it; don't know how many liters"  . Pancreatic cancer ([place])   . Pneumonia    "several times" (11/27/2015)  . Pneumothorax      Patient Active Problem List   Diagnosis Date Noted  . Chronic respiratory failure with hypoxia ([place]) 01/29/2016  . Malnutrition of moderate degree 01/29/2016  . Atrial fibrillation with rapid ventricular response ([place]) 01/28/2016  . Hypertension 01/28/2016  . Anxiety 01/28/2016  . Iron deficiency anemia 01/24/2016  . B12 deficiency 01/24/2016  . COPD with acute exacerbation ([place]) 01/20/2016  . COPD exacerbation ([place]) 01/20/2016  . Pancreatic cancer ([place]) 12/07/2015  . Epigastric pain   . Abdominal pain 11/27/2015  . Pancreatic mass 11/27/2015  . Hypokalemia 11/27/2015  . Leukocytosis 11/27/2015  . Chronic anticoagulation-Xarelto 09/06/2015  . Gastrointestinal hemorrhage with melena 08/08/2015  . Acute blood loss anemia 08/08/2015  . Chronic obstructive pulmonary disease (COPD) ([place]) 08/08/2015  . Chest pain 08/08/2015  . Dyspnea   . Elevated troponin I level 07/17/2015  . Hyperglycemia 07/17/2015  . Anemia 07/17/2015  . Atrial fibrillation ([place]) 07/17/2015  . Atrial flutter ([place]) 06/27/2015  . Atrial fibrillation with RVR ([place]) 06/27/2015  . COPD with exacerbation ([place]) 06/27/2015  . Hypertensive urgency 06/27/2015  . Tobacco abuse 06/27/2015  . Elevated troponin 06/27/2015  . CAP (community acquired pneumonia)   . OSTEOPOROSIS 04/10/2009  . WEIGHT LOSS 04/10/2009    Past Surgical History:  Procedure Laterality Date  . ANTERIOR CERVICAL DECOMP/DISCECTOMY  FUSION     "put 4 screws in"  . BACK SURGERY    . CARDIAC CATHETERIZATION  08/2004   Archie Endo 06/18/2010  . CARDIOVERSION N/A 07/18/2015   Procedure: CARDIOVERSION;  Surgeon: Josue Hector, MD;  Location: [facility]ITE;  Service: Cardiovascular;  Laterality: N/A;  . ESOPHAGOGASTRODUODENOSCOPY N/A 08/09/2015   Procedure: ESOPHAGOGASTRODUODENOSCOPY (EGD);  Surgeon: Rogene Houston, MD;  Location: [facility] ENDO SUITE;  Service: Endoscopy;  Laterality: N/A;  . EUS N/A 11/29/2015   Procedure: UPPER ENDOSCOPIC ULTRASOUND (EUS)  RADIAL;  Surgeon: Milus Banister, MD;  Location: [facility] ENDOSCOPY;  Service: Endoscopy;  Laterality: N/A;  . IR GENERIC HISTORICAL  12/18/2015   IR US GUIDE VASC ACCESS RIGHT 12/18/2015 Sandi Mariscal, MD [facility]-INTER[facility]  . IR GENERIC HISTORICAL  12/18/2015   IR FLUORO GUIDE PORT INSERTION RIGHT 12/18/2015 Sandi Mariscal, MD [facility]  . TONSILLECTOMY    . TUBAL LIGATION      OB History    Gravida Para Term Preterm AB Living   2         2   SAB TAB Ectopic Multiple Live Births                   Home Medications    Prior to Admission medications   Medication Sig Start Date End Date Taking? Authorizing Provider  COMBIVENT RESPIMAT 20-100 MCG/ACT AERS respimat Inhale 1 puff into the lungs 2 (two) times daily. 12/03/15   Ripudeep Krystal Eaton, MD  diltiazem (CARDIZEM CD) 360 MG 24 hr capsule Take 1 capsule (360 mg total) by mouth daily. 02/06/16   Kathie Dike, MD  escitalopram (LEXAPRO) 10 MG tablet Take 1 tablet (10 mg total) by mouth every evening. 12/03/15   Ripudeep Krystal Eaton, MD  furosemide (LASIX) 20 MG tablet Take 1 tablet (20 mg total) by mouth daily as needed for edema. Patient taking differently: Take 20 mg by mouth daily as needed for fluid or edema.  07/06/15   Arnoldo Lenis, MD  Gemcitabine HCl (GEMZAR IV) Inject into the vein. Day 1, day 8, day 15, every 28 days    Historical Provider, MD  guaiFENesin (ROBITUSSIN) 100 MG/5ML SOLN Take 10 mLs (200 mg total) by mouth every 4 (four) hours as needed for cough or to loosen phlegm. 01/23/16   Orvan Falconer, MD  HYDROcodone-acetaminophen (NORCO/VICODIN) 5-325 MG tablet Take 1 tablet by mouth every 4 (four) hours as needed. 02/07/16   Isla Pence, MD  ipratropium (ATROVENT) 0.02 % nebulizer solution Take 2.5 mLs (0.5 mg total) by nebulization 3 (three) times daily. 01/23/16   Orvan Falconer, MD  levalbuterol Penne Lash) 1.25 MG/0.5ML nebulizer solution Take 1.25 mg by nebulization every 4 (four) hours as needed for wheezing or shortness of breath. 02/05/16    Kathie Dike, MD  lidocaine-prilocaine (EMLA) cream Apply 1 application topically daily as needed (port access).  12/10/15   Historical Provider, MD  metoprolol tartrate (LOPRESSOR) 25 MG tablet Take 1 tablet (25 mg total) by mouth 2 (two) times daily. 02/05/16   Kathie Dike, MD  nicotine (NICODERM CQ - DOSED IN MG/24 HOURS) 14 mg/24hr patch Place 1 patch (14 mg total) onto the skin daily. 12/03/15   Ripudeep Krystal Eaton, MD  ondansetron (ZOFRAN) 8 MG tablet Take 1 tablet by mouth daily as needed for nausea/vomiting. 12/10/15   Historical Provider, MD  PACLitaxel Protein-Bound Part (ABRAXANE IV) Inject into the vein. Day 1, day 8, day 15, every 28 days    Historical  Provider, MD  pantoprazole (PROTONIX) 40 MG tablet Take 1 tablet (40 mg total) by mouth daily. 12/03/15   Ripudeep Krystal Eaton, MD  potassium chloride SA (K-DUR,KLOR-CON) 20 MEQ tablet Take 2 tablets (40 mEq total) by mouth 2 (two) times daily. 01/02/16 04/01/16  Baird Cancer, PA-C  predniSONE (DELTASONE) 10 MG tablet Take 40mg  po daily for 3 days then 30mg  daily for 3 days then 20mg  daily for 3 days then 10mg  daily for 3 days 02/05/16   Kathie Dike, MD  prochlorperazine (COMPAZINE) 10 MG tablet Take 1 tablet by mouth daily as needed for nausea/vomiting. 12/10/15   Historical Provider, MD  rivaroxaban (XARELTO) 20 MG TABS tablet Take 1 tablet (20 mg total) by mouth daily with supper. 07/19/15   Reyne Dumas, MD  SYMBICORT 160-4.5 MCG/ACT inhaler Inhale 1 puff into the lungs 2 (two) times daily. 12/03/15   Ripudeep Krystal Eaton, MD    Family History Family History  Problem Relation Age of Onset  . COPD Mother   . Diabetes Father   . Stroke Father     Social History Social History  Substance Use Topics  . Smoking status: Former Smoker    Packs/day: 0.50    Years: 46.00    Types: Cigarettes    Start date: 10/09/1969    Quit date: 11/29/2015  . Smokeless tobacco: Never Used  . Alcohol use No     Comment: quit 30 years ago     Allergies     Ibuprofen; Penicillins; Tiotropium bromide monohydrate; and Tramadol   Review of Systems Review of Systems  A complete 10 system review of systems was obtained and all systems are negative except as noted in the HPI and PMH.    Physical Exam Updated Vital Signs BP 146/93   Pulse 91   Temp 98.3 F (36.8 C) (Oral)   Resp 20   Ht 5\' 4"  (1.626 m)   Wt 128 lb (58.1 kg)   SpO2 100%   BMI 21.97 kg/m   Physical Exam  Constitutional: She is oriented to person, place, and time. She appears well-developed and well-nourished. No distress.  HENT:  Head: Normocephalic and atraumatic.  Eyes: EOM are normal.  Neck: Normal range of motion.  Cardiovascular: Normal rate and normal heart sounds.  An irregularly irregular rhythm present.  Pulmonary/Chest: Effort normal and breath sounds normal.  Coarse sounding cough. Bilateral expiratory wheezing.   Abdominal: Soft. She exhibits no distension. There is no tenderness.  Musculoskeletal: Normal range of motion. She exhibits no edema.  No lower extremity edema  Neurological: She is alert and oriented to person, place, and time.  Skin: Skin is warm and dry.  Psychiatric: She has a normal mood and affect. Judgment normal.  Nursing note and vitals reviewed.    ED Treatments / Results  DIAGNOSTIC STUDIES: Oxygen Saturation is 100% on [place], normal by my interpretation.    COORDINATION OF CARE: 11:49 AM Discussed treatment plan with pt at bedside and pt agreed to plan.    Labs (all labs ordered are listed, but only abnormal results are displayed) Labs Reviewed  CBC - Abnormal; Notable for the following:       Result Value   WBC 12.9 (*)    Hemoglobin 10.4 (*)    HCT 33.7 (*)    RDW 21.5 (*)    All other components within normal limits  BASIC METABOLIC PANEL  TROPONIN I    EKG  EKG Interpretation None  Radiology Dg Chest 2 View  Result Date: 02/11/2016 CLINICAL DATA:  Chest pain, abdominal pain, shortness of breath EXAM:  CHEST  2 VIEW COMPARISON:  02/07/2016 FINDINGS: There is a right-sided Port-A-Cath in satisfactory position. The lungs are hyperinflated likely secondary to COPD. There is mild biapical pleural thickening. There is no focal parenchymal opacity. There is no pleural effusion or pneumothorax. The heart and mediastinal contours are unremarkable. The osseous structures are unremarkable. IMPRESSION: No active cardiopulmonary disease. Electronically Signed   By: Kathreen Devoid   On: 02/11/2016 11:37    Procedures Procedures (including critical care time)  Medications Ordered in ED Medications - No data to display   Initial Impression / Assessment and Plan / ED Course  I have reviewed the triage vital signs and the nursing notes.  Pertinent labs & imaging results that were available during my care of the patient were reviewed by me and considered in my medical decision making (see chart for details).  Clinical Course    65yF with CP. Doubt ACS, PE, dissection or other emergent process. It has been determined that no acute conditions requiring further emergency intervention are present at this time. The patient has been advised of the diagnosis and plan. I reviewed any labs and imaging including any potential incidental findings. We have discussed signs and symptoms that warrant return to the ED and they are listed in the discharge instructions.    Final Clinical Impressions(s) / ED Diagnoses   Final diagnoses:  Shortness of breath  Chest pain, unspecified type    New Prescriptions New Prescriptions   No medications on file   I personally preformed the services scribed in my presence. The recorded information has been reviewed is accurate. Virgel Manifold, MD.    Virgel Manifold, MD 02/15/16 [phone]

## 2016-02-11 NOTE — ED Notes (Signed)
Patient to xray.

## 2016-02-11 NOTE — ED Triage Notes (Signed)
Patient states she was seen 2 weeks ago for chest pain and shortness of breath. States shortness of breath this week and dull aching chest pain. Patient called EMS from home.

## 2016-02-12 ENCOUNTER — Other Ambulatory Visit (HOSPITAL_COMMUNITY): Payer: Self-pay | Admitting: Oncology

## 2016-02-13 ENCOUNTER — Encounter (HOSPITAL_COMMUNITY): Payer: Self-pay

## 2016-02-13 ENCOUNTER — Ambulatory Visit: Payer: Medicare HMO | Admitting: Orthopaedic Surgery

## 2016-02-13 ENCOUNTER — Encounter (HOSPITAL_COMMUNITY): Payer: Medicare HMO | Attending: Hematology & Oncology

## 2016-02-13 VITALS — BP 116/64 | HR 65 | Temp 98.0°F | Resp 20 | Wt 120.8 lb

## 2016-02-13 DIAGNOSIS — E538 Deficiency of other specified B group vitamins: Secondary | ICD-10-CM | POA: Diagnosis not present

## 2016-02-13 DIAGNOSIS — D500 Iron deficiency anemia secondary to blood loss (chronic): Secondary | ICD-10-CM

## 2016-02-13 DIAGNOSIS — K922 Gastrointestinal hemorrhage, unspecified: Secondary | ICD-10-CM | POA: Diagnosis not present

## 2016-02-13 DIAGNOSIS — C251 Malignant neoplasm of body of pancreas: Secondary | ICD-10-CM | POA: Diagnosis present

## 2016-02-13 DIAGNOSIS — Z5111 Encounter for antineoplastic chemotherapy: Secondary | ICD-10-CM | POA: Diagnosis not present

## 2016-02-13 DIAGNOSIS — D509 Iron deficiency anemia, unspecified: Secondary | ICD-10-CM

## 2016-02-13 LAB — CBC WITH DIFFERENTIAL/PLATELET
BASOS ABS: 0 10*3/uL (ref 0.0–0.1)
BASOS PCT: 0 %
EOS ABS: 0 10*3/uL (ref 0.0–0.7)
Eosinophils Relative: 0 %
HCT: 27.6 % — ABNORMAL LOW (ref 36.0–46.0)
HEMOGLOBIN: 8.5 g/dL — AB (ref 12.0–15.0)
Lymphocytes Relative: 4 %
Lymphs Abs: 0.6 10*3/uL — ABNORMAL LOW (ref 0.7–4.0)
MCH: 26.3 pg (ref 26.0–34.0)
MCHC: 30.8 g/dL (ref 30.0–36.0)
MCV: 85.4 fL (ref 78.0–100.0)
MONO ABS: 0.9 10*3/uL (ref 0.1–1.0)
MONOS PCT: 5 %
NEUTROS PCT: 91 %
Neutro Abs: 15 10*3/uL — ABNORMAL HIGH (ref 1.7–7.7)
Platelets: 312 10*3/uL (ref 150–400)
RBC: 3.23 MIL/uL — ABNORMAL LOW (ref 3.87–5.11)
RDW: 21.2 % — AB (ref 11.5–15.5)
WBC: 16.5 10*3/uL — ABNORMAL HIGH (ref 4.0–10.5)

## 2016-02-13 LAB — COMPREHENSIVE METABOLIC PANEL
ALBUMIN: 3.3 g/dL — AB (ref 3.5–5.0)
ALT: 27 U/L (ref 14–54)
ANION GAP: 10 (ref 5–15)
AST: 19 U/L (ref 15–41)
Alkaline Phosphatase: 68 U/L (ref 38–126)
BUN: 38 mg/dL — ABNORMAL HIGH (ref 6–20)
CO2: 25 mmol/L (ref 22–32)
Calcium: 8.3 mg/dL — ABNORMAL LOW (ref 8.9–10.3)
Chloride: 103 mmol/L (ref 101–111)
Creatinine, Ser: 1.07 mg/dL — ABNORMAL HIGH (ref 0.44–1.00)
GFR calc Af Amer: >60 mL/min (ref >60)
GFR calc non Af Amer: 52 mL/min — ABNORMAL LOW (ref >60)
GLUCOSE: 128 mg/dL — AB (ref 65–99)
POTASSIUM: 3.8 mmol/L (ref 3.5–5.1)
SODIUM: 138 mmol/L (ref 135–145)
Total Bilirubin: 0.4 mg/dL (ref 0.3–1.2)
Total Protein: 5.7 g/dL — ABNORMAL LOW (ref 6.5–8.1)

## 2016-02-13 MED ORDER — SODIUM CHLORIDE 0.9 % IV SOLN
750.0000 mg | Freq: Once | INTRAVENOUS | Status: AC
  Administered 2016-02-13: 750 mg via INTRAVENOUS
  Filled 2016-02-13: qty 15

## 2016-02-13 MED ORDER — HYDROCODONE-ACETAMINOPHEN 5-325 MG PO TABS
1.0000 | ORAL_TABLET | Freq: Once | ORAL | Status: AC
  Administered 2016-02-13: 1 via ORAL

## 2016-02-13 MED ORDER — PROCHLORPERAZINE MALEATE 10 MG PO TABS
10.0000 mg | ORAL_TABLET | Freq: Once | ORAL | Status: AC
  Administered 2016-02-13: 10 mg via ORAL
  Filled 2016-02-13: qty 1

## 2016-02-13 MED ORDER — SODIUM CHLORIDE 0.9 % IV SOLN
INTRAVENOUS | Status: DC
  Administered 2016-02-13: 12:00:00 via INTRAVENOUS

## 2016-02-13 MED ORDER — SODIUM CHLORIDE 0.9 % IV SOLN
1596.0000 mg | Freq: Once | INTRAVENOUS | Status: AC
  Administered 2016-02-13: 1596 mg via INTRAVENOUS
  Filled 2016-02-13: qty 10.52

## 2016-02-13 MED ORDER — HYDROCODONE-ACETAMINOPHEN 5-325 MG PO TABS: 1.0000 | ORAL_TABLET | Freq: Four times a day (QID) | ORAL | 0 refills | Status: DC | PRN

## 2016-02-13 MED ORDER — SODIUM CHLORIDE 0.9 % IV SOLN
Freq: Once | INTRAVENOUS | Status: DC
Start: 2016-02-13 — End: 2016-02-13

## 2016-02-13 MED ORDER — PACLITAXEL PROTEIN-BOUND CHEMO INJECTION 100 MG
125.0000 mg/m2 | Freq: Once | INTRAVENOUS | Status: AC
  Administered 2016-02-13: 200 mg via INTRAVENOUS
  Filled 2016-02-13: qty 40

## 2016-02-13 MED ORDER — SODIUM CHLORIDE 0.9% FLUSH: 10.0000 mL | INTRAVENOUS | Status: DC | PRN

## 2016-02-13 MED ORDER — HEPARIN SOD (PORK) LOCK FLUSH 100 UNIT/ML IV SOLN
500.0000 [IU] | Freq: Once | INTRAVENOUS | Status: AC | PRN
  Administered 2016-02-13: 500 [IU]
  Filled 2016-02-13: qty 5

## 2016-02-13 MED ORDER — HYDROCODONE-ACETAMINOPHEN 5-325 MG PO TABS
ORAL_TABLET | ORAL | Status: AC
  Filled 2016-02-13: qty 1

## 2016-02-13 MED ORDER — CYANOCOBALAMIN 1000 MCG/ML IJ SOLN
1000.0000 ug | Freq: Once | INTRAMUSCULAR | Status: AC
  Administered 2016-02-13: 1000 ug via INTRAMUSCULAR
  Filled 2016-02-13: qty 1

## 2016-02-13 NOTE — Progress Notes (Signed)
Tolerated infusions w/o adverse reaction.  Alert, in no distress.  VSS.  Discharged ambulatory.  

## 2016-02-13 NOTE — Patient Instructions (Signed)
[facility] Discharge Instructions for Patients Receiving Chemotherapy   Beginning January 23rd 2017 lab work for the Cancer Center will be done in the  Main lab at [facility] on 1st floor. If you have a lab appointment with the Cancer Center please come in thru the  Main Entrance and check in at the main information desk   Today you received the following chemotherapy agents:  Abraxane and Gemzar.  If you develop nausea and vomiting, or diarrhea that is not controlled by your medication, call the clinic.  The clinic phone number is [phone]. Office hours are Monday-Friday 8:30am-5:00pm.  BELOW ARE SYMPTOMS THAT SHOULD BE REPORTED IMMEDIATELY:  *FEVER GREATER THAN 101.0 F  *CHILLS WITH OR WITHOUT FEVER  NAUSEA AND VOMITING THAT IS NOT CONTROLLED WITH YOUR NAUSEA MEDICATION  *UNUSUAL SHORTNESS OF BREATH  *UNUSUAL BRUISING OR BLEEDING  TENDERNESS IN MOUTH AND THROAT WITH OR WITHOUT PRESENCE OF ULCERS  *URINARY PROBLEMS  *BOWEL PROBLEMS  UNUSUAL RASH Items with * indicate a potential emergency and should be followed up as soon as possible. If you have an emergency after office hours please contact your primary care physician or go to the nearest emergency department.  Please call the clinic during office hours if you have any questions or concerns.   You may also contact the Patient Navigator at [phone] should you have any questions or need assistance in obtaining follow up care.      Resources For Cancer Patients and their Caregivers ? American [employer] Society: Can assist with transportation, wigs, general needs, runs Look Good Feel Better.        1-888-227-6333 ? Cancer Care: Provides financial assistance, online support groups, medication/co-pay assistance.  1-800-813-HOPE (4673) ? [facility] Assists [place] cancer patients and their families through emotional , educational and financial support.   336-427-4357 ? [place] DSS Where to apply for food stamps, Medicaid and utility assistance. 336-342-1394 ? [facility]: Transportation to medical appointments. 336-347-2287 ? Social Security Administration: May apply for disability if have a Stage IV cancer. 336-342-7796 1-800-772-1213 ? [place] [facility]ging, Disability and Transit Services: Assists with nutrition, care and transit needs. 336-349-2343         

## 2016-02-14 LAB — CANCER ANTIGEN 19-9: CA 19 9: 46 U/mL — AB (ref 0–35)

## 2016-02-14 LAB — INTRINSIC FACTOR ANTIBODIES: Intrinsic Factor: 1 AU/mL (ref 0.0–1.1)

## 2016-02-14 LAB — ANTI-PARIETAL ANTIBODY: PARIETAL CELL ANTIBODY-IGG: 8 U (ref 0.0–20.0)

## 2016-02-14 LAB — HOMOCYSTEINE: Homocysteine: 18.5 umol/L — ABNORMAL HIGH (ref 0.0–15.0)

## 2016-02-15 ENCOUNTER — Ambulatory Visit (HOSPITAL_COMMUNITY): Payer: Medicare HMO

## 2016-02-15 ENCOUNTER — Other Ambulatory Visit (HOSPITAL_COMMUNITY): Payer: Self-pay | Admitting: Oncology

## 2016-02-15 ENCOUNTER — Other Ambulatory Visit (HOSPITAL_COMMUNITY): Payer: Self-pay

## 2016-02-15 ENCOUNTER — Encounter: Payer: Medicare HMO | Admitting: Adult Health

## 2016-02-15 DIAGNOSIS — D649 Anemia, unspecified: Secondary | ICD-10-CM

## 2016-02-15 DIAGNOSIS — C257 Malignant neoplasm of other parts of pancreas: Secondary | ICD-10-CM

## 2016-02-15 LAB — METHYLMALONIC ACID, SERUM: Methylmalonic Acid, Quantitative: 165 nmol/L (ref 0–378)

## 2016-02-17 ENCOUNTER — Encounter (HOSPITAL_COMMUNITY): Payer: Self-pay

## 2016-02-17 ENCOUNTER — Inpatient Hospital Stay (HOSPITAL_COMMUNITY)
Admission: EM | Admit: 2016-02-17 | Discharge: 2016-02-19 | DRG: 394 | Disposition: A | Discharge status: Home / Self Care | Payer: Medicare HMO | Attending: Internal Medicine | Admitting: Internal Medicine

## 2016-02-17 DIAGNOSIS — Z7951 Long term (current) use of inhaled steroids: Secondary | ICD-10-CM

## 2016-02-17 DIAGNOSIS — Z885 Allergy status to narcotic agent status: Secondary | ICD-10-CM

## 2016-02-17 DIAGNOSIS — K649 Unspecified hemorrhoids: Secondary | ICD-10-CM | POA: Diagnosis not present

## 2016-02-17 DIAGNOSIS — Z7952 Long term (current) use of systemic steroids: Secondary | ICD-10-CM

## 2016-02-17 DIAGNOSIS — Z9981 Dependence on supplemental oxygen: Secondary | ICD-10-CM

## 2016-02-17 DIAGNOSIS — T451X5A Adverse effect of antineoplastic and immunosuppressive drugs, initial encounter: Secondary | ICD-10-CM | POA: Diagnosis present

## 2016-02-17 DIAGNOSIS — R55 Syncope and collapse: Secondary | ICD-10-CM | POA: Diagnosis present

## 2016-02-17 DIAGNOSIS — J449 Chronic obstructive pulmonary disease, unspecified: Secondary | ICD-10-CM | POA: Diagnosis present

## 2016-02-17 DIAGNOSIS — K625 Hemorrhage of anus and rectum: Secondary | ICD-10-CM | POA: Diagnosis not present

## 2016-02-17 DIAGNOSIS — Z7901 Long term (current) use of anticoagulants: Secondary | ICD-10-CM

## 2016-02-17 DIAGNOSIS — R739 Hyperglycemia, unspecified: Secondary | ICD-10-CM | POA: Diagnosis present

## 2016-02-17 DIAGNOSIS — M797 Fibromyalgia: Secondary | ICD-10-CM | POA: Diagnosis present

## 2016-02-17 DIAGNOSIS — Z79891 Long term (current) use of opiate analgesic: Secondary | ICD-10-CM

## 2016-02-17 DIAGNOSIS — Z880 Allergy status to penicillin: Secondary | ICD-10-CM

## 2016-02-17 DIAGNOSIS — I482 Chronic atrial fibrillation: Secondary | ICD-10-CM | POA: Diagnosis present

## 2016-02-17 DIAGNOSIS — E440 Moderate protein-calorie malnutrition: Secondary | ICD-10-CM | POA: Diagnosis present

## 2016-02-17 DIAGNOSIS — I10 Essential (primary) hypertension: Secondary | ICD-10-CM | POA: Diagnosis present

## 2016-02-17 DIAGNOSIS — Z79899 Other long term (current) drug therapy: Secondary | ICD-10-CM

## 2016-02-17 DIAGNOSIS — F17200 Nicotine dependence, unspecified, uncomplicated: Secondary | ICD-10-CM | POA: Diagnosis present

## 2016-02-17 DIAGNOSIS — Z981 Arthrodesis status: Secondary | ICD-10-CM

## 2016-02-17 DIAGNOSIS — T380X5A Adverse effect of glucocorticoids and synthetic analogues, initial encounter: Secondary | ICD-10-CM | POA: Diagnosis present

## 2016-02-17 DIAGNOSIS — Z888 Allergy status to other drugs, medicaments and biological substances status: Secondary | ICD-10-CM

## 2016-02-17 DIAGNOSIS — Z6820 Body mass index (BMI) 20.0-20.9, adult: Secondary | ICD-10-CM

## 2016-02-17 DIAGNOSIS — N179 Acute kidney failure, unspecified: Secondary | ICD-10-CM | POA: Diagnosis present

## 2016-02-17 DIAGNOSIS — I4891 Unspecified atrial fibrillation: Secondary | ICD-10-CM | POA: Diagnosis present

## 2016-02-17 DIAGNOSIS — C259 Malignant neoplasm of pancreas, unspecified: Secondary | ICD-10-CM | POA: Diagnosis present

## 2016-02-17 DIAGNOSIS — Z825 Family history of asthma and other chronic lower respiratory diseases: Secondary | ICD-10-CM

## 2016-02-17 DIAGNOSIS — Z8719 Personal history of other diseases of the digestive system: Secondary | ICD-10-CM

## 2016-02-17 DIAGNOSIS — D6481 Anemia due to antineoplastic chemotherapy: Secondary | ICD-10-CM | POA: Diagnosis present

## 2016-02-17 DIAGNOSIS — Z886 Allergy status to analgesic agent status: Secondary | ICD-10-CM

## 2016-02-17 DIAGNOSIS — J9611 Chronic respiratory failure with hypoxia: Secondary | ICD-10-CM | POA: Diagnosis present

## 2016-02-17 DIAGNOSIS — C251 Malignant neoplasm of body of pancreas: Secondary | ICD-10-CM | POA: Diagnosis present

## 2016-02-17 DIAGNOSIS — M1990 Unspecified osteoarthritis, unspecified site: Secondary | ICD-10-CM | POA: Diagnosis present

## 2016-02-17 LAB — CBC WITH DIFFERENTIAL/PLATELET
BASOS ABS: 0 10*3/uL (ref 0.0–0.1)
Basophils Relative: 0 %
EOS PCT: 0 %
Eosinophils Absolute: 0 10*3/uL (ref 0.0–0.7)
HCT: 28.2 % — ABNORMAL LOW (ref 36.0–46.0)
Hemoglobin: 8.8 g/dL — ABNORMAL LOW (ref 12.0–15.0)
Lymphocytes Relative: 6 %
Lymphs Abs: 0.7 10*3/uL (ref 0.7–4.0)
MCH: 27.2 pg (ref 26.0–34.0)
MCHC: 31.2 g/dL (ref 30.0–36.0)
MCV: 87 fL (ref 78.0–100.0)
MONO ABS: 0 10*3/uL — AB (ref 0.1–1.0)
Monocytes Relative: 0 %
Neutro Abs: 11 10*3/uL — ABNORMAL HIGH (ref 1.7–7.7)
Neutrophils Relative %: 94 %
PLATELETS: 278 10*3/uL (ref 150–400)
RBC: 3.24 MIL/uL — ABNORMAL LOW (ref 3.87–5.11)
RDW: 21.4 % — AB (ref 11.5–15.5)
WBC: 11.7 10*3/uL — ABNORMAL HIGH (ref 4.0–10.5)

## 2016-02-17 LAB — BASIC METABOLIC PANEL
Anion gap: 7 (ref 5–15)
BUN: 39 mg/dL — AB (ref 6–20)
CHLORIDE: 106 mmol/L (ref 101–111)
CO2: 26 mmol/L (ref 22–32)
CREATININE: 1.49 mg/dL — AB (ref 0.44–1.00)
Calcium: 8.7 mg/dL — ABNORMAL LOW (ref 8.9–10.3)
GFR calc Af Amer: 41 mL/min — ABNORMAL LOW (ref >60)
GFR calc non Af Amer: 35 mL/min — ABNORMAL LOW (ref >60)
Glucose, Bld: 124 mg/dL — ABNORMAL HIGH (ref 65–99)
Potassium: 4 mmol/L (ref 3.5–5.1)
Sodium: 139 mmol/L (ref 135–145)

## 2016-02-17 LAB — POC OCCULT BLOOD, ED: FECAL OCCULT BLD: POSITIVE — AB

## 2016-02-17 LAB — PROTIME-INR
INR: 1.57
Prothrombin Time: 18.9 seconds — ABNORMAL HIGH (ref 11.4–15.2)

## 2016-02-17 MED ORDER — SODIUM CHLORIDE 0.9 % IV BOLUS (SEPSIS)
500.0000 mL | Freq: Once | INTRAVENOUS | Status: AC
  Administered 2016-02-17: 500 mL via INTRAVENOUS

## 2016-02-17 MED ORDER — ACETAMINOPHEN 650 MG RE SUPP: 650.0000 mg | Freq: Four times a day (QID) | RECTAL | Status: DC | PRN

## 2016-02-17 MED ORDER — NICOTINE 14 MG/24HR TD PT24
14.0000 mg | MEDICATED_PATCH | Freq: Every day | TRANSDERMAL | Status: DC
  Administered 2016-02-18 – 2016-02-19 (×2): 14 mg via TRANSDERMAL
  Filled 2016-02-17 (×2): qty 1

## 2016-02-17 MED ORDER — ESCITALOPRAM OXALATE 10 MG PO TABS
10.0000 mg | ORAL_TABLET | Freq: Every evening | ORAL | Status: DC
  Administered 2016-02-17 – 2016-02-18 (×2): 10 mg via ORAL
  Filled 2016-02-17 (×2): qty 1

## 2016-02-17 MED ORDER — HYDROCODONE-ACETAMINOPHEN 5-325 MG PO TABS
1.0000 | ORAL_TABLET | Freq: Four times a day (QID) | ORAL | Status: DC | PRN
  Administered 2016-02-17 – 2016-02-19 (×8): 1 via ORAL
  Filled 2016-02-17 (×8): qty 1

## 2016-02-17 MED ORDER — ACETAMINOPHEN 325 MG PO TABS: 650.0000 mg | ORAL_TABLET | Freq: Four times a day (QID) | ORAL | Status: DC | PRN

## 2016-02-17 MED ORDER — MOMETASONE FURO-FORMOTEROL FUM 200-5 MCG/ACT IN AERO
2.0000 | INHALATION_SPRAY | Freq: Two times a day (BID) | RESPIRATORY_TRACT | Status: DC
  Administered 2016-02-18 – 2016-02-19 (×3): 2 via RESPIRATORY_TRACT
  Filled 2016-02-17: qty 8.8

## 2016-02-17 MED ORDER — PROCHLORPERAZINE MALEATE 5 MG PO TABS: 10.0000 mg | ORAL_TABLET | Freq: Every day | ORAL | Status: DC | PRN

## 2016-02-17 MED ORDER — ONDANSETRON HCL 4 MG/2ML IJ SOLN: 4.0000 mg | Freq: Four times a day (QID) | INTRAMUSCULAR | Status: DC | PRN

## 2016-02-17 MED ORDER — ALBUTEROL SULFATE (2.5 MG/3ML) 0.083% IN NEBU
5.0000 mg | INHALATION_SOLUTION | Freq: Once | RESPIRATORY_TRACT | Status: AC
  Administered 2016-02-17: 5 mg via RESPIRATORY_TRACT
  Filled 2016-02-17: qty 6

## 2016-02-17 MED ORDER — DILTIAZEM HCL ER COATED BEADS 180 MG PO CP24
360.0000 mg | ORAL_CAPSULE | Freq: Every day | ORAL | Status: DC
  Administered 2016-02-18 – 2016-02-19 (×2): 360 mg via ORAL
  Filled 2016-02-17 (×2): qty 2

## 2016-02-17 MED ORDER — LACTATED RINGERS IV SOLN
INTRAVENOUS | Status: DC
  Administered 2016-02-17 – 2016-02-18 (×2): via INTRAVENOUS

## 2016-02-17 MED ORDER — LEVALBUTEROL HCL 1.25 MG/0.5ML IN NEBU: 1.2500 mg | INHALATION_SOLUTION | RESPIRATORY_TRACT | Status: DC | PRN

## 2016-02-17 MED ORDER — METOPROLOL TARTRATE 25 MG PO TABS
25.0000 mg | ORAL_TABLET | Freq: Two times a day (BID) | ORAL | Status: DC
  Administered 2016-02-17 – 2016-02-19 (×4): 25 mg via ORAL
  Filled 2016-02-17 (×4): qty 1

## 2016-02-17 MED ORDER — PANTOPRAZOLE SODIUM 40 MG PO TBEC: 40.0000 mg | DELAYED_RELEASE_TABLET | Freq: Every day | ORAL | Status: DC

## 2016-02-17 MED ORDER — IPRATROPIUM BROMIDE 0.02 % IN SOLN
0.5000 mg | Freq: Three times a day (TID) | RESPIRATORY_TRACT | Status: DC
  Administered 2016-02-17 – 2016-02-19 (×5): 0.5 mg via RESPIRATORY_TRACT
  Filled 2016-02-17 (×5): qty 2.5

## 2016-02-17 MED ORDER — ONDANSETRON HCL 4 MG PO TABS
4.0000 mg | ORAL_TABLET | Freq: Four times a day (QID) | ORAL | Status: DC | PRN
  Filled 2016-02-17: qty 1

## 2016-02-17 NOTE — ED Notes (Signed)
ED Provider at bedside. 

## 2016-02-17 NOTE — ED Triage Notes (Signed)
Pt was brought in by EMS. Reports that she had a BM semi solid brown in color and when she wiped red blood noticed on toilet paper. No blood in toilet

## 2016-02-17 NOTE — ED Notes (Signed)
Present during EDP's rectal exam of pt

## 2016-02-17 NOTE — ED Provider Notes (Signed)
[facility] DEPT Provider Note   CSN: [id number] Arrival date & time: 02/17/16  1740     History   Chief Complaint Chief Complaint  Patient presents with  . Blood In Stools    HPI Lindsay Salazar is a 66 y.o. female.  HPI  Hx of pancreatic cancer - treated with Chemo and pending surgery - no radiation - dx in last 10 weeks ago.  COPD and has had severel ED visits and admission in last couple of months.  She was at home, wiped after a BM - had a 2-3 inch streak of BRB on the paper - no melena - no blood in the toilet.  She described the stools as "rusty sandy colored" - has intermittent diffuse vague abdominal pain - no nausea or vomiting and no appetite cahnges or distentions.  No f/c.  She has has been taking xaretlo for afib.  She does have a cough for 5 days - productive of phlegm.    Blood was single episode BRB in nature No associated abck pain  Past Medical History:  Diagnosis Date  . Anginal pain ([place])    thought she was having   heartburn  . Arthritis    bursitis , fibromyalgia  . Arthritis    "right hip" (11/27/2015)  . Asthma   . Atrial fibrillation ([place])   . Chronic bronchitis ([place])   . Chronic right hip pain   . COPD (chronic obstructive pulmonary disease) ([place])    wears home o2 prn  . Daily headache    "last 2-3 months" (11/27/2015)  . Exposure to TB    "mom had it when she was pregnant with me"  . Fibromyalgia   . GERD (gastroesophageal reflux disease)    use to have it but not now  . Heart murmur    38-  24 years old  . Hypertension   . On home oxygen therapy    11/27/2015 "whenever I need it; don't know how many liters"  . Pancreatic cancer ([place])   . Pneumonia    "several times" (11/27/2015)  . Pneumothorax     Patient Active Problem List   Diagnosis Date Noted  . Rectal bleed 02/17/2016  . Chronic respiratory failure with hypoxia ([place]) 01/29/2016  . Malnutrition of moderate degree 01/29/2016  . Atrial fibrillation with rapid ventricular  response ([place]) 01/28/2016  . Hypertension 01/28/2016  . Anxiety 01/28/2016  . Iron deficiency anemia 01/24/2016  . B12 deficiency 01/24/2016  . COPD with acute exacerbation ([place]) 01/20/2016  . COPD exacerbation ([place]) 01/20/2016  . Pancreatic cancer ([place]) 12/07/2015  . Epigastric pain   . Abdominal pain 11/27/2015  . Pancreatic mass 11/27/2015  . Hypokalemia 11/27/2015  . Leukocytosis 11/27/2015  . Chronic anticoagulation-Xarelto 09/06/2015  . Gastrointestinal hemorrhage with melena 08/08/2015  . Acute blood loss anemia 08/08/2015  . Chronic obstructive pulmonary disease (COPD) ([place]) 08/08/2015  . Chest pain 08/08/2015  . Dyspnea   . Elevated troponin I level 07/17/2015  . Hyperglycemia 07/17/2015  . Anemia 07/17/2015  . Atrial fibrillation ([place]) 07/17/2015  . Atrial flutter ([place]) 06/27/2015  . Atrial fibrillation with RVR ([place]) 06/27/2015  . COPD with exacerbation ([place]) 06/27/2015  . Hypertensive urgency 06/27/2015  . Tobacco abuse 06/27/2015  . Elevated troponin 06/27/2015  . CAP (community acquired pneumonia)   . OSTEOPOROSIS 04/10/2009  . WEIGHT LOSS 04/10/2009    Past Surgical History:  Procedure Laterality Date  . ANTERIOR CERVICAL DECOMP/DISCECTOMY FUSION     "put 4 screws  in"  . BACK SURGERY    . CARDIAC CATHETERIZATION  08/2004   Archie Endo 06/18/2010  . CARDIOVERSION N/A 07/18/2015   Procedure: CARDIOVERSION;  Surgeon: Josue Hector, MD;  Location: [facility];  Service: Cardiovascular;  Laterality: N/A;  . ESOPHAGOGASTRODUODENOSCOPY N/A 08/09/2015   Procedure: ESOPHAGOGASTRODUODENOSCOPY (EGD);  Surgeon: Rogene Houston, MD;  Location: [facility]ITE;  Service: Endoscopy;  Laterality: N/A;  . EUS N/A 11/29/2015   Procedure: UPPER ENDOSCOPIC ULTRASOUND (EUS) RADIAL;  Surgeon: Milus Banister, MD;  Location: [facility] END[facility];  Service: Endoscopy;  Laterality: N/A;  . IR GENERIC HISTORICAL  12/18/2015   IR US GUIDE VASC ACCESS RIGHT 12/18/2015 Sandi Mariscal, MD [facility]    . IR GENERIC HISTORICAL  12/18/2015   IR FLUORO GUIDE PORT INSERTION RIGHT 12/18/2015 Sandi Mariscal, MD [facility]  . TONSILLECTOMY    . TUBAL LIGATION      OB History    Gravida Para Term Preterm AB Living   2         2   SAB TAB Ectopic Multiple Live Births                   Home Medications    Prior to Admission medications   Medication Sig Start Date End Date Taking? Authorizing Provider  COMBIVENT RESPIMAT 20-100 MCG/ACT AERS respimat Inhale 1 puff into the lungs 2 (two) times daily. 12/03/15  Yes Ripudeep Krystal Eaton, MD  diltiazem (CARDIZEM CD) 360 MG 24 hr capsule Take 1 capsule (360 mg total) by mouth daily. 02/06/16  Yes Kathie Dike, MD  escitalopram (LEXAPRO) 10 MG tablet Take 1 tablet (10 mg total) by mouth every evening. 12/03/15  Yes Ripudeep Krystal Eaton, MD  HYDROcodone-acetaminophen (NORCO/VICODIN) 5-325 MG tablet Take 1 tablet by mouth every 6 (six) hours as needed for moderate pain. 02/13/16  Yes Manon Hilding Kefalas, PA-C  ipratropium (ATROVENT) 0.02 % nebulizer solution Take 2.5 mLs (0.5 mg total) by nebulization 3 (three) times daily. 01/23/16  Yes Orvan Falconer, MD  levalbuterol Penne Lash) 1.25 MG/0.5ML nebulizer solution Take 1.25 mg by nebulization every 4 (four) hours as needed for wheezing or shortness of breath. 02/05/16  Yes Kathie Dike, MD  metoprolol tartrate (LOPRESSOR) 25 MG tablet Take 1 tablet (25 mg total) by mouth 2 (two) times daily. 02/05/16  Yes Kathie Dike, MD  nicotine (NICODERM CQ - DOSED IN MG/24 HOURS) 14 mg/24hr patch Place 1 patch (14 mg total) onto the skin daily. 12/03/15  Yes Ripudeep K Rai, MD  pantoprazole (PROTONIX) 40 MG tablet Take 1 tablet (40 mg total) by mouth daily. 12/03/15  Yes Ripudeep Krystal Eaton, MD  predniSONE (DELTASONE) 10 MG tablet Take 40mg  po daily for 3 days then 30mg  daily for 3 days then 20mg  daily for 3 days then 10mg  daily for 3 days 02/05/16  Yes Kathie Dike, MD  rivaroxaban (XARELTO) 20 MG TABS tablet Take 1 tablet (20 mg total) by  mouth daily with supper. 07/19/15  Yes Reyne Dumas, MD  SYMBICORT 160-4.5 MCG/ACT inhaler Inhale 1 puff into the lungs 2 (two) times daily. 12/03/15  Yes Ripudeep Krystal Eaton, MD  Gemcitabine HCl (GEMZAR IV) Inject into the vein. Day 1, day 8, day 15, every 28 days    Historical Provider, MD  guaiFENesin (ROBITUSSIN) 100 MG/5ML SOLN Take 10 mLs (200 mg total) by mouth every 4 (four) hours as needed for cough or to loosen phlegm. Patient not taking: Reported on 02/11/2016 01/23/16   Orvan Falconer,  MD  HYDROcodone-acetaminophen (NORCO/VICODIN) 5-325 MG tablet Take 1 tablet by mouth every 4 (four) hours as needed. 02/07/16   Isla Pence, MD  ondansetron (ZOFRAN) 8 MG tablet Take 1 tablet by mouth daily as needed for nausea/vomiting. 12/10/15   Historical Provider, MD  PACLitaxel Protein-Bound Part (ABRAXANE IV) Inject into the vein. Day 1, day 8, day 15, every 28 days    Historical Provider, MD  potassium chloride SA (K-DUR,KLOR-CON) 20 MEQ tablet Take 2 tablets (40 mEq total) by mouth 2 (two) times daily. 01/02/16 04/01/16  Baird Cancer, PA-C  prochlorperazine (COMPAZINE) 10 MG tablet Take 1 tablet by mouth daily as needed for nausea/vomiting. 12/10/15   Historical Provider, MD    Family History Family History  Problem Relation Age of Onset  . COPD Mother   . Diabetes Father   . Stroke Father     Social History Social History  Substance Use Topics  . Smoking status: Former Smoker    Packs/day: 0.50    Years: 46.00    Types: Cigarettes    Start date: 10/09/1969    Quit date: 11/29/2015  . Smokeless tobacco: Never Used  . Alcohol use No     Comment: quit 30 years ago     Allergies   Ibuprofen; Penicillins; Tiotropium bromide monohydrate; and Tramadol   Review of Systems Review of Systems  All other systems reviewed and are negative.  Physical Exam Updated Vital Signs BP 136/67   Pulse 98   Temp 98.9 F (37.2 C) (Oral)   Resp 24   Ht 5\' 4"  (1.626 m)   Wt 121 lb (54.9 kg)   SpO2 100%    BMI 20.77 kg/m   Physical Exam  Constitutional: She appears well-developed and well-nourished. No distress.  HENT:  Head: Normocephalic and atraumatic.  Mouth/Throat: Oropharynx is clear and moist. No oropharyngeal exudate.  Eyes: Conjunctivae and EOM are normal. Pupils are equal, round, and reactive to light. Right eye exhibits no discharge. Left eye exhibits no discharge. No scleral icterus.  Neck: Normal range of motion. Neck supple. No JVD present. No thyromegaly present.  Cardiovascular: Normal rate, normal heart sounds and intact distal pulses.  Exam reveals no gallop and no friction rub.   No murmur heard. Atrial fibrillation, no tachycardia  Pulmonary/Chest: Effort normal. No respiratory distress. She has wheezes. She has no rales.  Abdominal: Soft. Bowel sounds are normal. She exhibits no distension and no mass. There is tenderness.  No increased WOB, diffuse mild wheezing, speaks in full sentences - no accessory muscle use.  Genitourinary:  Genitourinary Comments: Seek anoscope note below, chaperone present  Musculoskeletal: Normal range of motion. She exhibits no edema or tenderness.  Lymphadenopathy:    She has no cervical adenopathy.  Neurological: She is alert. Coordination normal.  Skin: Skin is warm and dry. No rash noted. No erythema.  Psychiatric: She has a normal mood and affect. Her behavior is normal.  Nursing note and vitals reviewed.    ED Treatments / Results  Labs (all labs ordered are listed, but only abnormal results are displayed) Labs Reviewed  CBC WITH DIFFERENTIAL/PLATELET - Abnormal; Notable for the following:       Result Value   WBC 11.7 (*)    RBC 3.24 (*)    Hemoglobin 8.8 (*)    HCT 28.2 (*)    RDW 21.4 (*)    Neutro Abs 11.0 (*)    Monocytes Absolute 0.0 (*)    All other components within normal  limits  PROTIME-INR - Abnormal; Notable for the following:    Prothrombin Time 18.9 (*)    All other components within normal limits    BASIC METABOLIC PANEL - Abnormal; Notable for the following:    Glucose, Bld 124 (*)    BUN 39 (*)    Creatinine, Ser 1.49 (*)    Calcium 8.7 (*)    GFR calc non Af Amer 35 (*)    GFR calc Af Amer 41 (*)    All other components within normal limits  POC OCCULT BLOOD, ED - Abnormal; Notable for the following:    Fecal Occult Bld POSITIVE (*)    All other components within normal limits     Radiology No results found.  Procedures Procedures (including critical care time)  Medications Ordered in ED Medications  albuterol (PROVENTIL) (2.5 MG/3ML) 0.083% nebulizer solution 5 mg (5 mg Nebulization Given 02/17/16 1851)  sodium chloride 0.9 % bolus 500 mL (500 mLs Intravenous New Bag/Given 02/17/16 1933)     Initial Impression / Assessment and Plan / ED Course  I have reviewed the triage vital signs and the nursing notes.  Pertinent labs & imaging results that were available during my care of the patient were reviewed by me and considered in my medical decision making (see chart for details).  Clinical Course    Procedure note: Anoscopy  The patient was placed in the left lateral decubitus position, with female chaperone in the room the endoscope was advanced. No signs of any bleeding, no blood in the rectal vault, no fissures, no visible external hemorrhoids, no palpable internal hemorrhoids on digital rectal exam. No obvious source of patient's bleeding and no active bleeding in the rectal vault.  Check hemoglobin, INR, basic metabolic panel, anticipate discharge if unremarkable however with the patient's baseline of being on chemotherapy this could cause hematologic every maladies that would necessitate further testing as the blood work has been ordered. She does not appear acutely ill.  Albuterol treatment given for wheezing on exam, patient states she is trying to tough it out at home instead of using medication for the wheezing. I have asked her to use it and not try to be  strong as the albuterol can help open up her lungs and prevent hypoxic spells.   Overall the patient has decreased hemoglobin, she is also likely hemoconcentrated suggestive that her hemoglobin is lower than it actually appears. Creatinine is elevated suggestive of the mildly dehydrated state. Discussed the patient's care with the hospitalist, Dr. Lorin Mercy, I appreciate her willingness to admit.  Final Clinical Impressions(s) / ED Diagnoses   Final diagnoses:  Rectal bleed    New Prescriptions New Prescriptions   No medications on file     Noemi Chapel, MD 02/17/16 2053

## 2016-02-17 NOTE — H&P (Signed)
History and Physical    Lindsay Salazar [id number] DOB: 07-15-1950 DOA: 02/17/2016  PCP: [name], PA-C Consultants:  [facility] - oncology; [facility] - cardiology; [facility] - orthopedics Patient coming from: home - lives with boyfriend; NOK: boyfriend and daughter, [phone]  Chief Complaint: rectal bleeding  HPI: Lindsay Salazar is a 66 y.o. female with medical history significant of COPD and afib with RVR on anticogulation who is known to me from admissions in 7/17 for GI bleed and 10/17 when she was diagnosed with pancreatic cancer.  Has had multiple hospitalizations since - breathing issues, atrial fibrillation (see below).  Has been feeling very weak.  Today, went to the bathroom once and pooped and it was "fresh blood" - lightish red color.  No blood in commode, only some blood on the tissue.  Only blood present one time on the tissue, no further bleeding.    H/o ulcer with bloody stools in June or July, required PRBC.    Otherwise she reports doing well recently.  She has been walking to/from the bathroom independently and has not required O2 recently.  She is being treated with Gemcitabine/Abraxane.  Supposed to have bloodwork done tomorrow by 2:30 in order to have blood transfusion during chemotherapy on Tuesday.  Since her admission in July, ER visits and hospitalizations include: 7/5-9/17 - GI bleed secondary to PUD in the context of anticoagulation, recent steroid use, and NSAIDs.   10/24-30/17 - Diagnosed with pancreatic adenocarcinoma 12/11/15 - ER visit for nonspecific abdominal pain 12/24/15 - ER visit for atypical PNA 12/17-20/17 - COPD exacerbation 01/28/16-02/05/16 - Acute on chronic respiratory failure with hypoxia/acute COPD exacerbation 02/07/16 - ER visit for Abdominal pain and COPD exacerbation 02/11/16 - ER visit for chest pain/SOB   ED Course: Per Dr. Sabra Heck:  Check hemoglobin, INR, basic metabolic panel, anticipate discharge if unremarkable however with the patient's  baseline of being on chemotherapy this could cause hematologic every maladies that would necessitate further testing as the blood work has been ordered. She does not appear acutely ill.  Albuterol treatment given for wheezing on exam, patient states she is trying to tough it out at home instead of using medication for the wheezing. I have asked her to use it and not try to be strong as the albuterol can help open up her lungs and prevent hypoxic spells.  Overall the patient has decreased hemoglobin, she is also likely hemoconcentrated suggestive that her hemoglobin is lower than it actually appears. Creatinine is elevated suggestive of the mildly dehydrated state. Discussed the patient's care with the hospitalist, Dr. Lorin Mercy, I appreciate her willingness to admit.  Review of Systems: As per HPI; otherwise 10 point review of systems reviewed and negative.   Ambulatory Status:  ambulates independently, refusing to use walker  Past Medical History:  Diagnosis Date  . Anginal pain ([place])    thought she was having   heartburn  . Arthritis    bursitis , fibromyalgia  . Arthritis    "right hip" (11/27/2015)  . Asthma   . Atrial fibrillation ([place])   . Chronic bronchitis ([place])   . Chronic right hip pain   . COPD (chronic obstructive pulmonary disease) ([place])    wears home o2 prn  . Daily headache    "last 2-3 months" (11/27/2015)  . Exposure to TB    "mom had it when she was pregnant with me"  . Fibromyalgia   . GERD (gastroesophageal reflux disease)    use to have it but  not now  . Heart murmur    29-  3 years old  . Hypertension   . On home oxygen therapy    11/27/2015 "whenever I need it; don't know how many liters"  . Pancreatic cancer ([place])   . Pneumonia    "several times" (11/27/2015)  . Pneumothorax     Past Surgical History:  Procedure Laterality Date  . ANTERIOR CERVICAL DECOMP/DISCECTOMY FUSION     "put 4 screws in"  . BACK SURGERY    . CARDIAC CATHETERIZATION  08/2004    Archie Endo 06/18/2010  . CARDIOVERSION N/A 07/18/2015   Procedure: CARDIOVERSION;  Surgeon: Josue Hector, MD;  Location: [facility];  Service: Cardiovascular;  Laterality: N/A;  . ESOPHAGOGASTRODUODENOSCOPY N/A 08/09/2015   Procedure: ESOPHAGOGASTRODUODENOSCOPY (EGD);  Surgeon: Rogene Houston, MD;  Location: [facility];  Service: Endoscopy;  Laterality: N/A;  . EUS N/A 11/29/2015   Procedure: UPPER ENDOSCOPIC ULTRASOUND (EUS) RADIAL;  Surgeon: Milus Banister, MD;  Location: [facility];  Service: Endoscopy;  Laterality: N/A;  . IR GENERIC HISTORICAL  12/18/2015   IR US GUIDE VASC ACCESS RIGHT 12/18/2015 Sandi Mariscal, MD [facility]  . IR GENERIC HISTORICAL  12/18/2015   IR FLUORO GUIDE PORT INSERTION RIGHT 12/18/2015 Sandi Mariscal, MD [facility]  . TONSILLECTOMY    . TUBAL LIGATION      Social History   Social History  . Marital status: Divorced    Spouse name: N/A  . Number of children: N/A  . Years of education: N/A   Occupational History  . retired    Social History Main Topics  . Smoking status: Former Smoker    Packs/day: 0.50    Years: 46.00    Types: Cigarettes    Start date: 10/09/1969    Quit date: 11/29/2015  . Smokeless tobacco: Never Used  . Alcohol use No     Comment: quit 30 years ago  . Drug use: No  . Sexual activity: Not Currently    Birth control/ protection: Surgical, Post-menopausal   Other Topics Concern  . Not on file   Social History Narrative  . No narrative on file    Allergies  Allergen Reactions  . Ibuprofen Nausea And Vomiting  . Penicillins Swelling    Has patient had a PCN reaction causing immediate rash, facial/tongue/throat swelling, SOB or lightheadedness with hypotension: Yes Has patient had a PCN reaction causing severe rash involving mucus membranes or skin necrosis: No Has patient had a PCN reaction that required hospitalization No Has patient had a PCN reaction occurring within the last 10 years: No If all of the above  answers are "NO", then may proceed with Cephalosporin use.   . Tiotropium Bromide Monohydrate Itching  . Tramadol Nausea And Vomiting    Family History  Problem Relation Age of Onset  . COPD Mother   . Diabetes Father   . Stroke Father     Prior to Admission medications   Medication Sig Start Date End Date Taking? Authorizing Provider  COMBIVENT RESPIMAT 20-100 MCG/ACT AERS respimat Inhale 1 puff into the lungs 2 (two) times daily. 12/03/15  Yes Ripudeep Krystal Eaton, MD  diltiazem (CARDIZEM CD) 360 MG 24 hr capsule Take 1 capsule (360 mg total) by mouth daily. 02/06/16  Yes Kathie Dike, MD  escitalopram (LEXAPRO) 10 MG tablet Take 1 tablet (10 mg total) by mouth every evening. 12/03/15  Yes Ripudeep Krystal Eaton, MD  HYDROcodone-acetaminophen (NORCO/VICODIN) 5-325 MG tablet Take 1 tablet by  mouth every 6 (six) hours as needed for moderate pain. 02/13/16  Yes Manon Hilding Kefalas, PA-C  ipratropium (ATROVENT) 0.02 % nebulizer solution Take 2.5 mLs (0.5 mg total) by nebulization 3 (three) times daily. 01/23/16  Yes Orvan Falconer, MD  levalbuterol Penne Lash) 1.25 MG/0.5ML nebulizer solution Take 1.25 mg by nebulization every 4 (four) hours as needed for wheezing or shortness of breath. 02/05/16  Yes Kathie Dike, MD  metoprolol tartrate (LOPRESSOR) 25 MG tablet Take 1 tablet (25 mg total) by mouth 2 (two) times daily. 02/05/16  Yes Kathie Dike, MD  nicotine (NICODERM CQ - DOSED IN MG/24 HOURS) 14 mg/24hr patch Place 1 patch (14 mg total) onto the skin daily. 12/03/15  Yes Ripudeep K Rai, MD  pantoprazole (PROTONIX) 40 MG tablet Take 1 tablet (40 mg total) by mouth daily. 12/03/15  Yes Ripudeep Krystal Eaton, MD  predniSONE (DELTASONE) 10 MG tablet Take 40mg  po daily for 3 days then 30mg  daily for 3 days then 20mg  daily for 3 days then 10mg  daily for 3 days 02/05/16  Yes Kathie Dike, MD  rivaroxaban (XARELTO) 20 MG TABS tablet Take 1 tablet (20 mg total) by mouth daily with supper. 07/19/15  Yes Reyne Dumas, MD  SYMBICORT  160-4.5 MCG/ACT inhaler Inhale 1 puff into the lungs 2 (two) times daily. 12/03/15  Yes Ripudeep Krystal Eaton, MD  Gemcitabine HCl (GEMZAR IV) Inject into the vein. Day 1, day 8, day 15, every 28 days    Historical Provider, MD  guaiFENesin (ROBITUSSIN) 100 MG/5ML SOLN Take 10 mLs (200 mg total) by mouth every 4 (four) hours as needed for cough or to loosen phlegm. Patient not taking: Reported on 02/11/2016 01/23/16   Orvan Falconer, MD  HYDROcodone-acetaminophen (NORCO/VICODIN) 5-325 MG tablet Take 1 tablet by mouth every 4 (four) hours as needed. 02/07/16   Isla Pence, MD  ondansetron (ZOFRAN) 8 MG tablet Take 1 tablet by mouth daily as needed for nausea/vomiting. 12/10/15   Historical Provider, MD  PACLitaxel Protein-Bound Part (ABRAXANE IV) Inject into the vein. Day 1, day 8, day 15, every 28 days    Historical Provider, MD  potassium chloride SA (K-DUR,KLOR-CON) 20 MEQ tablet Take 2 tablets (40 mEq total) by mouth 2 (two) times daily. 01/02/16 04/01/16  Baird Cancer, PA-C  prochlorperazine (COMPAZINE) 10 MG tablet Take 1 tablet by mouth daily as needed for nausea/vomiting. 12/10/15   Historical Provider, MD    Physical Exam: Vitals:   02/17/16 2000 02/17/16 2030 02/17/16 2045 02/17/16 2100  BP: 124/60 136/67    Pulse: 95 98 76 95  Resp:      Temp:      TempSrc:      SpO2: 100% 100% 100% 100%  Weight:      Height:         General:  Appears calm and comfortable and is NAD Eyes:  PERRL, EOMI, normal lids, iris ENT:  grossly normal hearing, lips & tongue, mmm Neck:  no LAD, masses or thyromegaly Cardiovascular: Irregularly irregular but rate controlled, no m/r/g. No LE edema.  Respiratory:  CTA bilaterally, no w/r/r. Normal respiratory effort. Abdomen:  soft, ntnd, NABS Skin:  no rash or induration seen on limited exam Musculoskeletal:  grossly normal tone BUE/BLE, good ROM, no bony abnormality Psychiatric:  grossly normal mood and affect, speech fluent and appropriate, AOx3 Neurologic:  CN  2-12 grossly intact, moves all extremities in coordinated fashion, sensation intact  Labs on Admission: I have personally reviewed following labs and imaging studies  CBC:  Recent Labs Lab 02/11/16 1104 02/13/16 1158 02/17/16 1841  WBC 12.9* 16.5* 11.7*  NEUTROABS  --  15.0* 11.0*  HGB 10.4* 8.5* 8.8*  HCT 33.7* 27.6* 28.2*  MCV 86.6 85.4 87.0  PLT 293 312 0000000   Basic Metabolic Panel:  Recent Labs Lab 02/11/16 1104 02/13/16 1158 02/17/16 1841  NA 138 138 139  K 3.3* 3.8 4.0  CL 102 103 106  CO2 27 25 26   GLUCOSE 108* 128* 124*  BUN 23* 38* 39*  CREATININE 0.89 1.07* 1.49*  CALCIUM 8.6* 8.3* 8.7*   GFR: Estimated Creatinine Clearance: 31.6 mL/min (by C-G formula based on SCr of 1.49 mg/dL (H)). Liver Function Tests:  Recent Labs Lab 02/13/16 1158  AST 19  ALT 27  ALKPHOS 68  BILITOT 0.4  PROT 5.7*  ALBUMIN 3.3*   No results for input(s): LIPASE, AMYLASE in the last 168 hours. No results for input(s): AMMONIA in the last 168 hours. Coagulation Profile:  Recent Labs Lab 02/17/16 1841  INR 1.57   Cardiac Enzymes:  Recent Labs Lab 02/11/16 1104  TROPONINI <0.02   BNP (last 3 results) No results for input(s): PROBNP in the last 8760 hours. HbA1C: No results for input(s): HGBA1C in the last 72 hours. CBG: No results for input(s): GLUCAP in the last 168 hours. Lipid Profile: No results for input(s): CHOL, HDL, LDLCALC, TRIG, CHOLHDL, LDLDIRECT in the last 72 hours. Thyroid Function Tests: No results for input(s): TSH, T4TOTAL, FREET4, T3FREE, THYROIDAB in the last 72 hours. Anemia Panel: No results for input(s): VITAMINB12, FOLATE, FERRITIN, TIBC, IRON, RETICCTPCT in the last 72 hours. Urine analysis:    Component Value Date/Time   COLORURINE YELLOW 02/07/2016 1130   APPEARANCEUR HAZY (A) 02/07/2016 1130   LABSPEC 1.015 02/07/2016 1130   PHURINE 7.0 02/07/2016 1130   GLUCOSEU NEGATIVE 02/07/2016 1130   HGBUR NEGATIVE 02/07/2016 1130    BILIRUBINUR NEGATIVE 02/07/2016 1130   KETONESUR NEGATIVE 02/07/2016 1130   PROTEINUR NEGATIVE 02/07/2016 1130   NITRITE NEGATIVE 02/07/2016 1130   LEUKOCYTESUR TRACE (A) 02/07/2016 1130    Creatinine Clearance: Estimated Creatinine Clearance: 31.6 mL/min (by C-G formula based on SCr of 1.49 mg/dL (H)).  Sepsis Labs: @LABRCNTIP (procalcitonin:4,lacticidven:4) )No results found for this or any previous visit (from the past 240 hour(s)).   Radiological Exams on Admission: No results found.  EKG: not done  Assessment/Plan Principal Problem:   Rectal bleed Active Problems:   Tobacco abuse   Hyperglycemia   Anemia   Atrial fibrillation (HCC)   Chronic obstructive pulmonary disease (COPD) (HCC)   Chronic anticoagulation-Xarelto   Pancreatic cancer (HCC)   AKI (acute kidney injury) (HCC)   Rectal bleeding -Patient with a single episode of noticing bright red blood on the tissue paper while wiping; no further bleeding noted -Thorough rectal evaluation in the ER was negative -She is on anticoagulation -Hgb is 8.8, previously 8.5 on 1/10, 10.4 on 1/8; she does have AKI and so she may be mildly hemoconcentrated -Heme positive -Will observe overnight without plan for further evaluation treatment unless she has further bleeding episodes and/or Hgb drops -Of note, she is due for blood work tomorrow for the cancer center; these orders are pending for Prepare RBC and Type and screen and can be released and drawn tomorrow prior to discharge if possible -She is already scheduled to have a blood transfusion during chemotherapy on Tuesday and so she would only need a transfusion sooner for symptomatic anemia  Afib -Her CHA2DS2-VASc score was  previously calculated to be 3 -This being the case, her bleeding risk on anticoagulation appears to be markedly higher than her stroke risk off it -Will hold Xarelto for now and strongly consider discontinuation  Pancreatic cancer 4.2 cm adenoCA in the  body of the pancreas with main pancreatic duct obstruction involving portosplenic vein confluence; increase CA 19-9 of 197 at the start of treatment -Plan is for 2 cycles of chemotherapy followed by restaging tests and then consideration of surgical resection by Dr. Barry Dienes.  COPD -No current evidence of exacerbation -She should have completed her prednisone taper from her last hospitalization today; mild hyperglycemia is likely related to this and would be expected to improve now that steroids are complete -Nebs as per home regimen  AKI -Creatinine 1.49, previously 1.07 on 1/10 and 0.89 on 1/8 -Patient and boyfriend report that she drinks plenty -Will rehydrate with LR at 50 cc/hr while also allowing patient to have regular diet -Recheck BMP in AM  Tobacco dependence -Patient reports no further tobacco -The room reeks of tobacco -Her boyfriend claims to be the smoker, yet she is still on a daily nicotine patch -will continue her nicotine patch and strongly encouraged her boyfriend to also stop smoking  DVT prophylaxis: Xarelto (held tonight) Code Status:  Full - confirmed with patient/family - I have encouraged her to seriously consider this Family Communication: Boyfriend present throughout evaluation  Disposition Plan:  Home once clinically improved Consults called: None  Admission status: It is my clinical opinion that referral for OBSERVATION is reasonable and necessary in this patient based on the above information provided. The aforementioned taken together are felt to place the patient at high risk for further clinical deterioration. However it is anticipated that the patient may be medically stable for discharge from the hospital within 24 to 48 hours.    Karmen Bongo MD [facility]  If 7PM-7AM, please contact night-coverage [email or web address] Password [email or web address]  02/17/2016, 9:20 PM

## 2016-02-18 ENCOUNTER — Other Ambulatory Visit (HOSPITAL_COMMUNITY): Payer: Medicare HMO

## 2016-02-18 DIAGNOSIS — R739 Hyperglycemia, unspecified: Secondary | ICD-10-CM | POA: Diagnosis present

## 2016-02-18 DIAGNOSIS — C251 Malignant neoplasm of body of pancreas: Secondary | ICD-10-CM | POA: Diagnosis present

## 2016-02-18 DIAGNOSIS — F17200 Nicotine dependence, unspecified, uncomplicated: Secondary | ICD-10-CM | POA: Diagnosis present

## 2016-02-18 DIAGNOSIS — R55 Syncope and collapse: Secondary | ICD-10-CM | POA: Diagnosis present

## 2016-02-18 DIAGNOSIS — M797 Fibromyalgia: Secondary | ICD-10-CM | POA: Diagnosis present

## 2016-02-18 DIAGNOSIS — M1990 Unspecified osteoarthritis, unspecified site: Secondary | ICD-10-CM | POA: Diagnosis present

## 2016-02-18 DIAGNOSIS — Z981 Arthrodesis status: Secondary | ICD-10-CM | POA: Diagnosis not present

## 2016-02-18 DIAGNOSIS — D649 Anemia, unspecified: Secondary | ICD-10-CM

## 2016-02-18 DIAGNOSIS — Z880 Allergy status to penicillin: Secondary | ICD-10-CM | POA: Diagnosis not present

## 2016-02-18 DIAGNOSIS — E440 Moderate protein-calorie malnutrition: Secondary | ICD-10-CM | POA: Diagnosis present

## 2016-02-18 DIAGNOSIS — Z886 Allergy status to analgesic agent status: Secondary | ICD-10-CM | POA: Diagnosis not present

## 2016-02-18 DIAGNOSIS — N179 Acute kidney failure, unspecified: Secondary | ICD-10-CM | POA: Diagnosis present

## 2016-02-18 DIAGNOSIS — Z9981 Dependence on supplemental oxygen: Secondary | ICD-10-CM | POA: Diagnosis not present

## 2016-02-18 DIAGNOSIS — C259 Malignant neoplasm of pancreas, unspecified: Secondary | ICD-10-CM | POA: Diagnosis not present

## 2016-02-18 DIAGNOSIS — Z885 Allergy status to narcotic agent status: Secondary | ICD-10-CM | POA: Diagnosis not present

## 2016-02-18 DIAGNOSIS — Z825 Family history of asthma and other chronic lower respiratory diseases: Secondary | ICD-10-CM | POA: Diagnosis not present

## 2016-02-18 DIAGNOSIS — K625 Hemorrhage of anus and rectum: Secondary | ICD-10-CM | POA: Diagnosis not present

## 2016-02-18 DIAGNOSIS — J9611 Chronic respiratory failure with hypoxia: Secondary | ICD-10-CM | POA: Diagnosis present

## 2016-02-18 DIAGNOSIS — D6481 Anemia due to antineoplastic chemotherapy: Secondary | ICD-10-CM | POA: Diagnosis present

## 2016-02-18 DIAGNOSIS — K649 Unspecified hemorrhoids: Secondary | ICD-10-CM | POA: Diagnosis present

## 2016-02-18 DIAGNOSIS — I482 Chronic atrial fibrillation: Secondary | ICD-10-CM | POA: Diagnosis present

## 2016-02-18 DIAGNOSIS — Z7901 Long term (current) use of anticoagulants: Secondary | ICD-10-CM

## 2016-02-18 DIAGNOSIS — I10 Essential (primary) hypertension: Secondary | ICD-10-CM | POA: Diagnosis present

## 2016-02-18 DIAGNOSIS — T380X5A Adverse effect of glucocorticoids and synthetic analogues, initial encounter: Secondary | ICD-10-CM | POA: Diagnosis present

## 2016-02-18 DIAGNOSIS — J449 Chronic obstructive pulmonary disease, unspecified: Secondary | ICD-10-CM | POA: Diagnosis present

## 2016-02-18 DIAGNOSIS — Z6820 Body mass index (BMI) 20.0-20.9, adult: Secondary | ICD-10-CM | POA: Diagnosis not present

## 2016-02-18 DIAGNOSIS — T451X5A Adverse effect of antineoplastic and immunosuppressive drugs, initial encounter: Secondary | ICD-10-CM | POA: Diagnosis present

## 2016-02-18 LAB — CBC
HCT: 25.3 % — ABNORMAL LOW (ref 36.0–46.0)
Hemoglobin: 7.4 g/dL — ABNORMAL LOW (ref 12.0–15.0)
MCH: 26 pg (ref 26.0–34.0)
MCHC: 29.2 g/dL — ABNORMAL LOW (ref 30.0–36.0)
MCV: 88.8 fL (ref 78.0–100.0)
Platelets: 259 K/uL (ref 150–400)
RBC: 2.85 MIL/uL — ABNORMAL LOW (ref 3.87–5.11)
RDW: 21.1 % — ABNORMAL HIGH (ref 11.5–15.5)
WBC: 12.3 K/uL — ABNORMAL HIGH (ref 4.0–10.5)

## 2016-02-18 LAB — BASIC METABOLIC PANEL
ANION GAP: 4 — AB (ref 5–15)
BUN: 37 mg/dL — ABNORMAL HIGH (ref 6–20)
CALCIUM: 8.3 mg/dL — AB (ref 8.9–10.3)
CO2: 27 mmol/L (ref 22–32)
Chloride: 105 mmol/L (ref 101–111)
Creatinine, Ser: 0.93 mg/dL (ref 0.44–1.00)
GFR calc non Af Amer: >60 mL/min (ref >60)
GLUCOSE: 90 mg/dL (ref 65–99)
Potassium: 3.5 mmol/L (ref 3.5–5.1)
SODIUM: 136 mmol/L (ref 135–145)

## 2016-02-18 LAB — FOLATE: Folate: 5.4 ng/mL — ABNORMAL LOW

## 2016-02-18 LAB — HEMOGLOBIN AND HEMATOCRIT, BLOOD
HCT: 26 % — ABNORMAL LOW (ref 36.0–46.0)
HEMATOCRIT: 27.1 % — AB (ref 36.0–46.0)
Hemoglobin: 8.4 g/dL — ABNORMAL LOW (ref 12.0–15.0)
Hemoglobin: 7.9 g/dL — ABNORMAL LOW (ref 12.0–15.0)

## 2016-02-18 LAB — RETICULOCYTES
RBC.: 3.09 MIL/uL — ABNORMAL LOW (ref 3.87–5.11)
Retic Ct Pct: <0.4 % — ABNORMAL LOW (ref 0.4–3.1)

## 2016-02-18 MED ORDER — SODIUM CHLORIDE 0.9 % IV SOLN
8.0000 mg/h | INTRAVENOUS | Status: DC
  Administered 2016-02-18: 8 mg/h via INTRAVENOUS
  Filled 2016-02-18 (×6): qty 80

## 2016-02-18 MED ORDER — PANTOPRAZOLE SODIUM 40 MG IV SOLR: 40.0000 mg | Freq: Two times a day (BID) | INTRAVENOUS | Status: DC

## 2016-02-18 MED ORDER — SODIUM CHLORIDE 0.9 % IV SOLN
80.0000 mg | Freq: Once | INTRAVENOUS | Status: AC
  Administered 2016-02-18: 80 mg via INTRAVENOUS
  Filled 2016-02-18 (×2): qty 80

## 2016-02-18 NOTE — Progress Notes (Signed)
PROGRESS NOTE                                                                                                                                                                                                             Patient Demographics:    Lindsay Salazar, is a 66 y.o. female, DOB - 1950-04-23, [id number]  Admit date - 02/17/2016   Admitting Physician Karmen Bongo, MD  Outpatient Primary MD for the patient is [name], PA-C  LOS - 0  Chief Complaint  Patient presents with  . Blood In Stools       Brief Narrative  Lindsay Salazar is a 66 y.o. female with medical history significant of COPD and afib with RVR on anticogulation who is known to me from admissions in 7/17 for GI bleed and 10/17 when she was diagnosed with pancreatic cancer.  Has had multiple hospitalizations since - breathing issues, atrial fibrillation (see below).  Has been feeling very weak.  Today, went to the bathroom once and noticed a moderate amount of blood on the toilet paper with some drop in H&H and mild BUN elevation.   Subjective:    Lindsay Salazar today has, No headache, No chest pain, No abdominal pain - No Nausea, No new weakness tingling or numbness, No Cough - SOB.     Assessment  & Plan :     1.Bright red blood per rectum. Previous history of peptic ulcer disease with bleeding, currently on xaralto. No nausea or upper GI symptoms, BUN is mildly elevated, H&H has dropped some as compared to a few days ago although she is also on chemotherapy. At this time nothing by mouth except meds, IV PPI drip, hold xaralto, monitor H&H, type screen, anemia panel. GI input.  2. Chronic A. fib with advice to score of at least 3. Rate controlling medications to be continued with a sip of water, xaralto held due to #1 above. Patient explained the risks and benefits of holding xaralto anticoagulation, she understands and accepts the plan of folding  xaralto.  3. Pancreatic adenocarcinoma. Currently undergoing chemotherapy, we'll follow with oncology postdischarge.  4. COPD. Compensated. Supportive care.  5. Smoking. Counseled to quit.  6. ARF upon admission. Resolved with IV fluids.    Family Communication  :  None  Code Status :  Full  Diet :  Diet NPO time specified Except for: Sips with Meds    Disposition Plan  : Home 1-2 days  Consults  :  GI  Procedures  :    DVT Prophylaxis  :    SCDs    Lab Results  Component Value Date   PLT 259 02/18/2016    Inpatient Medications  Scheduled Meds: . diltiazem  360 mg Oral Daily  . escitalopram  10 mg Oral QPM  . ipratropium  0.5 mg Nebulization TID  . metoprolol tartrate  25 mg Oral BID  . mometasone-formoterol  2 puff Inhalation BID  . nicotine  14 mg Transdermal Daily  . pantoprazole (PROTONIX) IVPB  80 mg Intravenous Once  . [START ON 02/21/2016] pantoprazole  40 mg Intravenous Q12H   Continuous Infusions: . lactated ringers 50 mL/hr at 02/17/16 2251  . pantoprozole (PROTONIX) infusion     PRN Meds:.acetaminophen **OR** acetaminophen, HYDROcodone-acetaminophen, levalbuterol, ondansetron **OR** ondansetron (ZOFRAN) IV, prochlorperazine  Antibiotics  :    Anti-infectives    None         Objective:   Vitals:   02/18/16 0559 02/18/16 0900 02/18/16 0943 02/18/16 0949  BP: 134/73 139/77    Pulse: 73 87    Resp: (!) 24 20    Temp: 98.4 F (36.9 C)     TempSrc: Oral     SpO2: 100% 100% 100% 100%  Weight:      Height:        Wt Readings from Last 3 Encounters:  02/17/16 56.4 kg (124 lb 5.4 oz)  02/13/16 54.8 kg (120 lb 12.8 oz)  02/11/16 58.1 kg (128 lb)     Intake/Output Summary (Last 24 hours) at 02/18/16 1009 Last data filed at 02/18/16 0800  Gross per 24 hour  Intake              500 ml  Output              250 ml  Net              250 ml     Physical Exam  Awake Alert, Oriented X 3, No new F.N deficits, Normal  affect .AT,PERRAL Supple Neck,No JVD, No cervical lymphadenopathy appriciated.  Symmetrical Chest wall movement, Good air movement bilaterally, CTAB iRRR,No Gallops,Rubs or new Murmurs, No Parasternal Heave +ve B.Sounds, Abd Soft, No tenderness, No organomegaly appriciated, No rebound - guarding or rigidity. No Cyanosis, Clubbing or edema, No new Rash or bruise       Data Review:    CBC  Recent Labs Lab 02/11/16 1104 02/13/16 1158 02/17/16 1841 02/18/16 0450  WBC 12.9* 16.5* 11.7* 12.3*  HGB 10.4* 8.5* 8.8* 7.4*  HCT 33.7* 27.6* 28.2* 25.3*  PLT 293 312 278 259  MCV 86.6 85.4 87.0 88.8  MCH 26.7 26.3 27.2 26.0  MCHC 30.9 30.8 31.2 29.2*  RDW 21.5* 21.2* 21.4* 21.1*  LYMPHSABS  --  0.6* 0.7  --   MONOABS  --  0.9 0.0*  --   EOSABS  --  0.0 0.0  --   BASOSABS  --  0.0 0.0  --     Chemistries   Recent Labs Lab 02/11/16 1104 02/13/16 1158 02/17/16 1841 02/18/16 0450  NA 138 138 139 136  K 3.3* 3.8 4.0 3.5  CL 102 103 106 105  CO2 27 25 26 27   GLUCOSE 108* 128* 124* 90  BUN 23* 38* 39* 37*  CREATININE 0.89 1.07* 1.49* 0.93  CALCIUM 8.6* 8.3*  8.7* 8.3*  AST  --  19  --   --   ALT  --  27  --   --   ALKPHOS  --  68  --   --   BILITOT  --  0.4  --   --    ------------------------------------------------------------------------------------------------------------------ No results for input(s): CHOL, HDL, LDLCALC, TRIG, CHOLHDL, LDLDIRECT in the last 72 hours.  Lab Results  Component Value Date   HGBA1C 5.8 (H) 07/17/2015   ------------------------------------------------------------------------------------------------------------------ No results for input(s): TSH, T4TOTAL, T3FREE, THYROIDAB in the last 72 hours.  Invalid input(s): FREET3 ------------------------------------------------------------------------------------------------------------------ No results for input(s): VITAMINB12, FOLATE, FERRITIN, TIBC, IRON, RETICCTPCT in the last 72  hours.  Coagulation profile  Recent Labs Lab 02/17/16 1841  INR 1.57    No results for input(s): DDIMER in the last 72 hours.  Cardiac Enzymes  Recent Labs Lab 02/11/16 1104  TROPONINI <0.02   ------------------------------------------------------------------------------------------------------------------    Component Value Date/Time   BNP 113.0 (H) 01/28/2016 0119    Micro Results No results found for this or any previous visit (from the past 240 hour(s)).  Radiology Reports Dg Chest 2 View  Result Date: 02/11/2016 CLINICAL DATA:  Chest pain, abdominal pain, shortness of breath EXAM: CHEST  2 VIEW COMPARISON:  02/07/2016 FINDINGS: There is a right-sided Port-A-Cath in satisfactory position. The lungs are hyperinflated likely secondary to COPD. There is mild biapical pleural thickening. There is no focal parenchymal opacity. There is no pleural effusion or pneumothorax. The heart and mediastinal contours are unremarkable. The osseous structures are unremarkable. IMPRESSION: No active cardiopulmonary disease. Electronically Signed   By: Kathreen Devoid   On: 02/11/2016 11:37   Dg Chest 2 View  Result Date: 02/07/2016 CLINICAL DATA:  Pancreatic carcinoma. Shortness of breath with exertion EXAM: CHEST  2 VIEW COMPARISON:  January 31, 2016 FINDINGS: Scarring in each lung apex region is stable. There is no edema or consolidation. Heart size and pulmonary vascularity are normal. No adenopathy. There is atherosclerotic calcification in the aorta. Port-A-Cath tip is at the cavoatrial junction. No pneumothorax. There is postoperative change in the lower cervical spine. There are no blastic or lytic bone lesions. IMPRESSION: Stable scarring in each lung apex. No edema or consolidation. No evident adenopathy. There is aortic atherosclerosis. Electronically Signed   By: Lowella Grip III M.D.   On: 02/07/2016 13:46   Dg Chest 2 View  Result Date: 01/20/2016 CLINICAL DATA:  Productive  cough and shortness of breath for 2 weeks. EXAM: CHEST  2 VIEW COMPARISON:  12/24/2015 chest radiographs and CTA FINDINGS: Right jugular Port-A-Cath remains in place with tip terminating over the lower SVC. The cardiomediastinal silhouette is within normal limits. Aortic atherosclerosis is noted. The lungs remain hyperinflated with a similar appearance of biapical scarring including calcification in the right apex. There is no evidence of acute airspace consolidation, edema, pleural effusion, or pneumothorax. Prior cervical spine fusion. IMPRESSION: 1. No active cardiopulmonary disease. 2. Aortic atherosclerosis. Electronically Signed   By: Logan Bores M.D.   On: 01/20/2016 21:33   Dg Chest Port 1 View  Result Date: 01/31/2016 CLINICAL DATA:  Cough, CHF EXAM: PORTABLE CHEST 1 VIEW COMPARISON:  01/28/2016 FINDINGS: Right Port-A-Cath remains in place, unchanged. Scarring in the apices bilaterally. No other confluent airspace opacities. Heart is normal size. No effusions or acute bony abnormality. IMPRESSION: Biapical scarring.  No active disease. Electronically Signed   By: Rolm Baptise M.D.   On: 01/31/2016 10:16   Dg Chest Port 1  View  Result Date: 01/28/2016 CLINICAL DATA:  Shortness of breath wheezing and cough EXAM: PORTABLE CHEST 1 VIEW COMPARISON:  Chest radiograph 01/20/2016 CT chest 12/24/2015 FINDINGS: Right chest wall Port-A-Cath tip is at the cavoatrial junction. Cardiomediastinal contours are normal. There is no focal airspace consolidation or pulmonary edema. The lungs are hyperexpanded. No pneumothorax or pleural effusion. IMPRESSION: COPD without focal airspace disease. Electronically Signed   By: Ulyses Jarred M.D.   On: 01/28/2016 02:07    Time Spent in minutes  30   Desirea Mizrahi K M.D on 02/18/2016 at 10:09 AM  Between 7am to 7pm - Pager - [phone]  After 7pm go to [email or web address] - password [email or web address]  [facility] -  Office  [phone]

## 2016-02-18 NOTE — Consult Note (Signed)
Reason for Consult rectal bleeding Referring Physician: Hospitalist   Lindsay Salazar is an 66 y.o. female.  HPI:  Admitted thru the ED yesterday with rectal bleeding. She has had 2 episodes of BRRB on the toilet tissue. Once at home and one yesterday. Hemoglobin yesterday on admission was 8.8. Appears that hemoglobin has remained in this range for a while. She states she was going to be transfused tomorrow at [facility]. She denies any black stools. Admitted in July for melena and underwent and EGD by Dr. Laural Golden.       The duodenal bulb and second portion of the duodenum were normal. Impression:               - Normal esophagus.                           - Z-line irregular, 37 cm from the incisors.                           - 2 cm hiatal hernia.                           - Non-bleeding erosive gastropathy.                           - Non-bleeding gastric ulcer with pigmented                            material felt to be source of GI bleeding.                           - Normal duodenal bulb and second portion of the                            duodenum.                           - No specimens collected. She had abdominal pain before presenting to the ED. Rated pain at an 8. She says pain medication controlling her symptoms.  She has never undergone a colonoscopy.  Anoscope in the ED did not reveal source of bleeding. She was guaiac positive.      Hx of atrial fib and maintained on Xarelto which is on hold. Hx of pancreatic cancer and takes chemo at the [facility] at [facility]. Diagnosed in October of 2017.  CBC Latest Ref Rng & Units 02/18/2016 02/18/2016 02/17/2016  WBC 4.0 - 10.5 K/uL - 12.3(H) 11.7(H)  Hemoglobin 12.0 - 15.0 g/dL 8.4(L) 7.4(L) 8.8(L)  Hematocrit 36.0 - 46.0 % 27.1(L) 25.3(L) 28.2(L)  Platelets 150 - 400 K/uL - 259 278       Past Medical History:  Diagnosis Date  . Anginal pain ([place])    thought she was having   heartburn  . Arthritis    bursitis ,  fibromyalgia  . Arthritis    "right hip" (11/27/2015)  . Asthma   . Atrial fibrillation ([place])   . Chronic bronchitis ([place])   . Chronic right hip pain   . COPD (chronic obstructive pulmonary disease) ([place])    wears home o2 prn  . Daily headache    "last  2-3 months" (11/27/2015)  . Exposure to TB    "mom had it when she was pregnant with me"  . Fibromyalgia   . GERD (gastroesophageal reflux disease)    use to have it but not now  . Heart murmur    8-  33 years old  . Hypertension   . On home oxygen therapy    11/27/2015 "whenever I need it; don't know how many liters"  . Pancreatic cancer ([place])   . Pneumonia    "several times" (11/27/2015)  . Pneumothorax     Past Surgical History:  Procedure Laterality Date  . ANTERIOR CERVICAL DECOMP/DISCECTOMY FUSION     "put 4 screws in"  . BACK SURGERY    . CARDIAC CATHETERIZATION  08/2004   Archie Endo 06/18/2010  . CARDIOVERSION N/A 07/18/2015   Procedure: CARDIOVERSION;  Surgeon: Josue Hector, MD;  Location: [facility] ENDO [facility]ITE;  Service: Cardiovascular;  Laterality: N/A;  . ESOPHAGOGASTRODUODENOSCOPY N/A 08/09/2015   Procedure: ESOPHAGOGASTRODUODENOSCOPY (EGD);  Surgeon: Rogene Houston, MD;  Location: [facility] ENDO SUITE;  Service: Endoscopy;  Laterality: N/A;  . EUS N/A 11/29/2015   Procedure: UPPER ENDOSCOPIC ULTRASOUND (EUS) RADIAL;  Surgeon: Milus Banister, MD;  Location: [facility] ENDOSCOPY;  Service: Endoscopy;  Laterality: N/A;  . IR GENERIC HISTORICAL  12/18/2015   IR US GUIDE VASC ACCESS RIGHT 12/18/2015 Sandi Mariscal, MD [facility]-INTERV RAD  . IR GENERIC HISTORICAL  12/18/2015   IR FLUORO GUIDE PORT INSERTION RIGHT 12/18/2015 Sandi Mariscal, MD [facility]-INTER[facility] RAD  . TONSILLECTOMY    . TUBAL LIGATION      Family History  Problem Relation Age of Onset  . COPD Mother   . Diabetes Father   . Stroke Father     Social History:  reports that she quit smoking about 2 months ago. Her smoking use included Cigarettes. She started smoking about 46 years ago. She has  a 23.00 pack-year smoking history. She has never used smokeless tobacco. She reports that she does not drink alcohol or use drugs.  Allergies:  Allergies  Allergen Reactions  . Ibuprofen Nausea And Vomiting  . Penicillins Swelling    Has patient had a PCN reaction causing immediate rash, facial/tongue/throat swelling, SOB or lightheadedness with hypotension: Yes Has patient had a PCN reaction causing severe rash involving mucus membranes or skin necrosis: No Has patient had a PCN reaction that required hospitalization No Has patient had a PCN reaction occurring within the last 10 years: No If all of the above answers are "NO", then may proceed with Cephalosporin use.   . Tiotropium Bromide Monohydrate Itching  . Tramadol Nausea And Vomiting    Medications: I have reviewed the patient's current medications.  Results for orders placed or performed during the hospital encounter of 02/17/16 (from the past 48 hour(s))  CBC with Differential/Platelet     Status: Abnormal   Collection Time: 02/17/16  6:41 PM  Result Value Ref Range   WBC 11.7 (H) 4.0 - 10.5 K/uL   RBC 3.24 (L) 3.87 - 5.11 MIL/uL   Hemoglobin 8.8 (L) 12.0 - 15.0 g/dL   HCT 28.2 (L) 36.0 - 46.0 %   MCV 87.0 78.0 - 100.0 fL   MCH 27.2 26.0 - 34.0 pg   MCHC 31.2 30.0 - 36.0 g/dL   RDW 21.4 (H) 11.5 - 15.5 %   Platelets 278 150 - 400 K/uL   Neutrophils Relative % 94 %   Neutro Abs 11.0 (H) 1.7 - 7.7 K/uL   Lymphocytes Relative  6 %   Lymphs Abs 0.7 0.7 - 4.0 K/uL   Monocytes Relative 0 %   Monocytes Absolute 0.0 (L) 0.1 - 1.0 K/uL   Eosinophils Relative 0 %   Eosinophils Absolute 0.0 0.0 - 0.7 K/uL   Basophils Relative 0 %   Basophils Absolute 0.0 0.0 - 0.1 K/uL  Protime-INR     Status: Abnormal   Collection Time: 02/17/16  6:41 PM  Result Value Ref Range   Prothrombin Time 18.9 (H) 11.4 - 15.2 seconds   INR 6.96   Basic metabolic panel     Status: Abnormal   Collection Time: 02/17/16  6:41 PM  Result Value Ref  Range   Sodium 139 135 - 145 mmol/L   Potassium 4.0 3.5 - 5.1 mmol/L   Chloride 106 101 - 111 mmol/L   CO2 26 22 - 32 mmol/L   Glucose, Bld 124 (H) 65 - 99 mg/dL   BUN 39 (H) 6 - 20 mg/dL   Creatinine, Ser 1.49 (H) 0.44 - 1.00 mg/dL   Calcium 8.7 (L) 8.9 - 10.3 mg/dL   GFR calc non Af Amer 35 (L) >60 mL/min   GFR calc Af Amer 41 (L) >60 mL/min    Comment: (NOTE) The eGFR has been calculated using the CKD EPI equation. This calculation has not been validated in all clinical situations. eGFR's persistently <60 mL/min signify possible Chronic Kidney Disease.    Anion gap 7 5 - 15  POC occult blood, ED Provider will collect     Status: Abnormal   Collection Time: 02/17/16  6:42 PM  Result Value Ref Range   Fecal Occult Bld POSITIVE (A) NEGATIVE  Basic metabolic panel     Status: Abnormal   Collection Time: 02/18/16  4:50 AM  Result Value Ref Range   Sodium 136 135 - 145 mmol/L   Potassium 3.5 3.5 - 5.1 mmol/L   Chloride 105 101 - 111 mmol/L   CO2 27 22 - 32 mmol/L   Glucose, Bld 90 65 - 99 mg/dL   BUN 37 (H) 6 - 20 mg/dL   Creatinine, Ser 0.93 0.44 - 1.00 mg/dL   Calcium 8.3 (L) 8.9 - 10.3 mg/dL   GFR calc non Af Amer >60 >60 mL/min   GFR calc Af Amer >60 >60 mL/min    Comment: (NOTE) The eGFR has been calculated using the CKD EPI equation. This calculation has not been validated in all clinical situations. eGFR's persistently <60 mL/min signify possible Chronic Kidney Disease.    Anion gap 4 (L) 5 - 15  CBC     Status: Abnormal   Collection Time: 02/18/16  4:50 AM  Result Value Ref Range   WBC 12.3 (H) 4.0 - 10.5 K/uL   RBC 2.85 (L) 3.87 - 5.11 MIL/uL   Hemoglobin 7.4 (L) 12.0 - 15.0 g/dL   HCT 25.3 (L) 36.0 - 46.0 %   MCV 88.8 78.0 - 100.0 fL   MCH 26.0 26.0 - 34.0 pg   MCHC 29.2 (L) 30.0 - 36.0 g/dL   RDW 21.1 (H) 11.5 - 15.5 %   Platelets 259 150 - 400 K/uL  Type and screen [facility]     Status: None   Collection Time: 02/18/16  6:21 AM  Result Value  Ref Range   ABO/RH(D) O POS    Antibody Screen NEG    Sample Expiration 02/21/2016   Reticulocytes     Status: Abnormal   Collection Time: 02/18/16  9:52 AM  Result Value Ref Range   Retic Ct Pct <0.4 (L) 0.4 - 3.1 %   RBC. 3.09 (L) 3.87 - 5.11 MIL/uL   Retic Count, Manual NOT CALCULATED 19.0 - 186.0 K/uL  Hemoglobin and hematocrit, blood     Status: Abnormal   Collection Time: 02/18/16  9:52 AM  Result Value Ref Range   Hemoglobin 8.4 (L) 12.0 - 15.0 g/dL   HCT 27.1 (L) 36.0 - 46.0 %    No results found.  ROS Blood pressure 139/77, pulse 87, temperature 98.4 F (36.9 C), temperature source Oral, resp. rate 20, height _0  (1.626 m), weight 124 lb 5.4 oz (56.4 kg), SpO2 100 %. Physical Exam  Alert and oriented. Skin warm and dry. Oral mucosa is moist.   . Sclera anicteric, conjunctivae is pink. Thyroid not enlarged. No cervical lymphadenopathy.Bilateral wheezes. Heart regular rate and rhythm.  Abdomen is soft. Bowel sounds are positive. No hepatomegaly. No abdominal masses felt. No tenderness.  No edema to lower extremities.      Assessment/Plan: BRRB. No abdominal pain. Has never undergone a colonoscopy. ? Hemorrhoids, colon cancer, AVM, ulcer needs to be ruled out.  Will discuss with Dr. Laural Golden.    Riely Baskett W 02/18/2016, 11:50 AM

## 2016-02-18 NOTE — Progress Notes (Signed)
K. Schorr has been notified of Hgb 7.9 and HCT 26.

## 2016-02-18 NOTE — Care Management Note (Signed)
Case Management Note  Patient Details  Name: Lindsay Salazar MRN: [id number] Date of Birth: 04-13-1950  Subjective/Objective:    Patient adm from home with rectal bleed. She in ind with ADL's, has walker and oxygen PTA. She was set up with [facility] last admission and refused to let home health company into her house. Still refusing [facility] services.                 Action/Plan: Anticipate DC home with self care. No CM needs.   Expected Discharge Date:        02/19/2016          Expected Discharge Plan:  Home/Self Care  In-House Referral:  NA  Discharge planning Services  CM Consult  Post Acute Care Choice:  NA Choice offered to:  NA  DME Arranged:    DME Agency:     HH Arranged:    HH Agency:     Status of Service:  Completed, signed off  If discussed at [employer] of Stay Meetings, dates discussed:    Additional Comments:  Kaesyn Johnston, Chauncey Reading, RN 02/18/2016, 3:10 PM

## 2016-02-18 NOTE — Care Management Obs Status (Signed)
[facility] NOTIFICATION   Patient Details  Name: Lindsay Salazar MRN: [id number] Date of Birth: [birth year]   Medicare Observation Status Notification Given:  Yes    Kassi Esteve, Chauncey Reading, RN 02/18/2016, 3:12 PM

## 2016-02-19 ENCOUNTER — Encounter (HOSPITAL_COMMUNITY): Payer: Medicare HMO

## 2016-02-19 LAB — HEMOGLOBIN AND HEMATOCRIT, BLOOD
HCT: 27.7 % — ABNORMAL LOW (ref 36.0–46.0)
Hemoglobin: 8.5 g/dL — ABNORMAL LOW (ref 12.0–15.0)

## 2016-02-19 LAB — IRON AND TIBC
IRON: 207 ug/dL — AB (ref 28–170)
Saturation Ratios: 60 % — ABNORMAL HIGH (ref 10.4–31.8)
TIBC: 346 ug/dL (ref 250–450)
UIBC: 139 ug/dL

## 2016-02-19 LAB — VITAMIN B12: VITAMIN B 12: 435 pg/mL (ref 180–914)

## 2016-02-19 LAB — PREPARE RBC (CROSSMATCH)

## 2016-02-19 LAB — FERRITIN: FERRITIN: 874 ng/mL — AB (ref 11–307)

## 2016-02-19 MED ORDER — DIPHENHYDRAMINE HCL 50 MG/ML IJ SOLN: 25.0000 mg | Freq: Four times a day (QID) | INTRAMUSCULAR | Status: DC | PRN

## 2016-02-19 MED ORDER — FUROSEMIDE 10 MG/ML IJ SOLN
20.0000 mg | Freq: Once | INTRAMUSCULAR | Status: DC
  Filled 2016-02-19: qty 2

## 2016-02-19 MED ORDER — GUAIFENESIN-DM 100-10 MG/5ML PO SYRP
5.0000 mL | ORAL_SOLUTION | ORAL | Status: DC | PRN
  Administered 2016-02-19: 5 mL via ORAL
  Filled 2016-02-19: qty 5

## 2016-02-19 MED ORDER — SODIUM CHLORIDE 0.9 % IV SOLN: Freq: Once | INTRAVENOUS | Status: DC

## 2016-02-19 MED ORDER — DOCUSATE SODIUM 100 MG PO CAPS: 100.0000 mg | ORAL_CAPSULE | Freq: Two times a day (BID) | ORAL | 0 refills | Status: DC

## 2016-02-19 NOTE — Progress Notes (Deleted)
Patient currently admitted to hospital.

## 2016-02-19 NOTE — Discharge Summary (Signed)
Lindsay Salazar [id number] DOB: 24-Jan-1951 DOA: 02/17/2016  PCP: [name], PA-C  Admit date: 02/17/2016  Discharge date: 02/19/2016  Admitted From: Home  Disposition:  Home   Recommendations for Outpatient Follow-up:   Follow up with PCP in 1-2 weeks  PCP Please obtain BMP/CBC, 2 view CXR in 1week,  (see Discharge instructions)   PCP Please follow up on the following pending results: None   Home Health: None Equipment/Devices: None  Consultations: GI Discharge Condition: Stable   CODE STATUS: Full   Diet Recommendation:  Heart Healthy    Chief Complaint  Patient presents with  . Blood In Stools     Brief history of present illness from the day of admission and additional interim summary    Lindsay A Durhamis a 66 y.o.femalewith medical history significant of COPD and afib with RVR on anticogulation who is known to me from admissions in 7/17 for GI bleed and 10/17 when she was diagnosed with pancreatic cancer. Has had multiple hospitalizations since - breathing issues, atrial fibrillation (see below). Has been feeling very weak. Today, went to the bathroom once and noticed a moderate amount of blood on the toilet paper with some drop in H&H and mild BUN elevation.  Hospital issues addressed     1.Bright red blood per rectum. Previous history of peptic ulcer disease with bleeding, currently on xaralto. No nausea or upper GI symptoms, Seen by GI, H&H remained to be stable when accounted for IV fluid-induced delusion, leading likely hemorrhoidal and resolved, her GI okay to discharge home on xaralto with outpatient follow-up. Anoscopy done in the ER was unremarkable. Anemia panel was unremarkable. She is currently symptom-free.  2. Chronic A. fib with advice to score of at least 3. Rate controlling  medications to be continued along with xaralto.  3. Pancreatic adenocarcinoma. Currently undergoing chemotherapy, we'll follow with oncology postdischarge.  4. COPD. Compensated. Supportive care.  5. Smoking. Counseled to quit.  6. ARF upon admission. Resolved with IV fluids.   Discharge diagnosis     Principal Problem:   Rectal bleed Active Problems:   Tobacco abuse   Hyperglycemia   Anemia   Atrial fibrillation (HCC)   Chronic obstructive pulmonary disease (COPD) (HCC)   Chronic anticoagulation-Xarelto   Pancreatic cancer (HCC)   AKI (acute kidney injury) ([place])    Discharge instructions    Discharge Instructions    Diet - low sodium heart healthy    Complete by:  As directed    Discharge instructions    Complete by:  As directed    Follow with Primary MD BAUCOM, JENNY B, PA-C in 2-3 days   Get CBC, CMP, 2 view Chest X ray checked  by Primary MD  in 2-3 days ( we routinely change or add medications that can affect your baseline labs and fluid status, therefore we recommend that you get the mentioned basic workup next visit with your PCP, your PCP may decide not to get them or add new tests based on their clinical decision)  Activity: As tolerated with Full fall precautions use walker/cane & assistance as needed   Disposition Home    Diet:  Heart Healthy    For Heart failure patients - Check your Weight same time everyday, if you gain over 2 pounds, or you develop in leg swelling, experience more shortness of breath or chest pain, call your Primary MD immediately. Follow Cardiac Low Salt Diet and 1.5 lit/day fluid restriction.   On your next visit with your primary care physician please Get Medicines reviewed and adjusted.   Please request your Prim.MD to go over all Hospital Tests and Procedure/Radiological results at the follow up, please get all Hospital records sent to your Prim MD by signing hospital release before you go home.   If you experience  worsening of your admission symptoms, develop shortness of breath, life threatening emergency, suicidal or homicidal thoughts you must seek medical attention immediately by calling 911 or calling your MD immediately  if symptoms less severe.  You Must read complete instructions/literature along with all the possible adverse reactions/side effects for all the Medicines you take and that have been prescribed to you. Take any new Medicines after you have completely understood and accpet all the possible adverse reactions/side effects.   Do not drive, operate heavy machinery, perform activities at heights, swimming or participation in water activities or provide baby sitting services if your were admitted for syncope or siezures until you have seen by Primary MD or a Neurologist and advised to do so again.  Do not drive when taking Pain medications.    Do not take more than prescribed Pain, Sleep and Anxiety Medications  Special Instructions: If you have smoked or chewed Tobacco  in the last 2 yrs please stop smoking, stop any regular Alcohol  and or any Recreational drug use.  Wear Seat belts while driving.   Please note  You were cared for by a hospitalist during your hospital stay. If you have any questions about your discharge medications or the care you received while you were in the hospital after you are discharged, you can call the unit and asked to speak with the hospitalist on call if the hospitalist that took care of you is not available. Once you are discharged, your primary care physician will handle any further medical issues. Please note that NO REFILLS for any discharge medications will be authorized once you are discharged, as it is imperative that you return to your primary care physician (or establish a relationship with a primary care physician if you do not have one) for your aftercare needs so that they can reassess your need for medications and monitor your lab values.    Increase activity slowly    Complete by:  As directed       Discharge Medications   Allergies as of 02/19/2016      Reactions   Ibuprofen Nausea And Vomiting   Penicillins Swelling   Has patient had a PCN reaction causing immediate rash, facial/tongue/throat swelling, SOB or lightheadedness with hypotension: Yes Has patient had a PCN reaction causing severe rash involving mucus membranes or skin necrosis: No Has patient had a PCN reaction that required hospitalization No Has patient had a PCN reaction occurring within the last 10 years: No If all of the above answers are "NO", then may proceed with Cephalosporin use.   Tiotropium Bromide Monohydrate Itching   Tramadol Nausea And Vomiting      Medication List    TAKE these medications  ABRAXANE IV Inject into the vein. Day 1, day 8, day 15, every 28 days   COMBIVENT RESPIMAT 20-100 MCG/ACT Aers respimat Generic drug:  Ipratropium-Albuterol Inhale 1 puff into the lungs 2 (two) times daily.   diltiazem 360 MG 24 hr capsule Commonly known as:  CARDIZEM CD Take 1 capsule (360 mg total) by mouth daily.   docusate sodium 100 MG capsule Commonly known as:  COLACE Take 1 capsule (100 mg total) by mouth 2 (two) times daily.   escitalopram 10 MG tablet Commonly known as:  LEXAPRO Take 1 tablet (10 mg total) by mouth every evening.   GEMZAR IV Inject into the vein. Day 1, day 8, day 15, every 28 days   HYDROcodone-acetaminophen 5-325 MG tablet Commonly known as:  NORCO/VICODIN Take 1 tablet by mouth every 6 (six) hours as needed for moderate pain.   ipratropium 0.02 % nebulizer solution Commonly known as:  ATROVENT Take 2.5 mLs (0.5 mg total) by nebulization 3 (three) times daily.   levalbuterol 1.25 MG/0.5ML nebulizer solution Commonly known as:  XOPENEX Take 1.25 mg by nebulization every 4 (four) hours as needed for wheezing or shortness of breath.   metoprolol tartrate 25 MG tablet Commonly known as:  LOPRESSOR Take  1 tablet (25 mg total) by mouth 2 (two) times daily.   nicotine 14 mg/24hr patch Commonly known as:  NICODERM CQ - dosed in mg/24 hours Place 1 patch (14 mg total) onto the skin daily.   ondansetron 8 MG tablet Commonly known as:  ZOFRAN Take 1 tablet by mouth daily as needed for nausea/vomiting.   pantoprazole 40 MG tablet Commonly known as:  PROTONIX Take 1 tablet (40 mg total) by mouth daily.   predniSONE 10 MG tablet Commonly known as:  DELTASONE Take 40mg  po daily for 3 days then 30mg  daily for 3 days then 20mg  daily for 3 days then 10mg  daily for 3 days   prochlorperazine 10 MG tablet Commonly known as:  COMPAZINE Take 1 tablet by mouth daily as needed for nausea/vomiting.   rivaroxaban 20 MG Tabs tablet Commonly known as:  XARELTO Take 1 tablet (20 mg total) by mouth daily with supper.   SYMBICORT 160-4.5 MCG/ACT inhaler Generic drug:  budesonide-formoterol Inhale 1 puff into the lungs 2 (two) times daily.       Follow-up Information    BAUCOM, JENNY B, PA-C. Schedule an appointment as soon as possible for a visit in 2 day(s).   Specialty:  Physician Assistant Why:  and your oncologist Contact information: [address] [place] [address] [phone]        Hildred Laser, MD. Schedule an appointment as soon as possible for a visit in 1 week(s).   Specialty:  Gastroenterology Contact information: [address], [address] [place] [address] [phone]           Major procedures and Radiology Reports - PLEASE review detailed and final reports thoroughly  -         Dg Chest 2 View  Result Date: 02/11/2016 CLINICAL DATA:  Chest pain, abdominal pain, shortness of breath EXAM: CHEST  2 VIEW COMPARISON:  02/07/2016 FINDINGS: There is a right-sided Port-A-Cath in satisfactory position. The lungs are hyperinflated likely secondary to COPD. There is mild biapical pleural thickening. There is no focal parenchymal opacity. There is no pleural  effusion or pneumothorax. The heart and mediastinal contours are unremarkable. The osseous structures are unremarkable. IMPRESSION: No active cardiopulmonary disease. Electronically Signed  By: Kathreen Devoid   On: 02/11/2016 11:37   Dg Chest 2 View  Result Date: 02/07/2016 CLINICAL DATA:  Pancreatic carcinoma. Shortness of breath with exertion EXAM: CHEST  2 VIEW COMPARISON:  January 31, 2016 FINDINGS: Scarring in each lung apex region is stable. There is no edema or consolidation. Heart size and pulmonary vascularity are normal. No adenopathy. There is atherosclerotic calcification in the aorta. Port-A-Cath tip is at the cavoatrial junction. No pneumothorax. There is postoperative change in the lower cervical spine. There are no blastic or lytic bone lesions. IMPRESSION: Stable scarring in each lung apex. No edema or consolidation. No evident adenopathy. There is aortic atherosclerosis. Electronically Signed   By: Lowella Grip III M.D.   On: 02/07/2016 13:46   Dg Chest 2 View  Result Date: 01/20/2016 CLINICAL DATA:  Productive cough and shortness of breath for 2 weeks. EXAM: CHEST  2 VIEW COMPARISON:  12/24/2015 chest radiographs and CTA FINDINGS: Right jugular Port-A-Cath remains in place with tip terminating over the lower SVC. The cardiomediastinal silhouette is within normal limits. Aortic atherosclerosis is noted. The lungs remain hyperinflated with a similar appearance of biapical scarring including calcification in the right apex. There is no evidence of acute airspace consolidation, edema, pleural effusion, or pneumothorax. Prior cervical spine fusion. IMPRESSION: 1. No active cardiopulmonary disease. 2. Aortic atherosclerosis. Electronically Signed   By: Logan Bores M.D.   On: 01/20/2016 21:33   Dg Chest Port 1 View  Result Date: 01/31/2016 CLINICAL DATA:  Cough, CHF EXAM: PORTABLE CHEST 1 VIEW COMPARISON:  01/28/2016 FINDINGS: Right Port-A-Cath remains in place, unchanged. Scarring in  the apices bilaterally. No other confluent airspace opacities. Heart is normal size. No effusions or acute bony abnormality. IMPRESSION: Biapical scarring.  No active disease. Electronically Signed   By: Rolm Baptise M.D.   On: 01/31/2016 10:16   Dg Chest Port 1 View  Result Date: 01/28/2016 CLINICAL DATA:  Shortness of breath wheezing and cough EXAM: PORTABLE CHEST 1 VIEW COMPARISON:  Chest radiograph 01/20/2016 CT chest 12/24/2015 FINDINGS: Right chest wall Port-A-Cath tip is at the cavoatrial junction. Cardiomediastinal contours are normal. There is no focal airspace consolidation or pulmonary edema. The lungs are hyperexpanded. No pneumothorax or pleural effusion. IMPRESSION: COPD without focal airspace disease. Electronically Signed   By: Ulyses Jarred M.D.   On: 01/28/2016 02:07    Micro Results     No results found for this or any previous visit (from the past 240 hour(s)).  Today   Subjective    [name] today has no headache,no chest abdominal pain,no new weakness tingling or numbness, feels much better wants to go home today.     Objective   Blood pressure 131/64, pulse 70, temperature 97.5 F (36.4 C), temperature source Oral, resp. rate 18, height 5\' 4"  (1.626 m), weight 56.4 kg (124 lb 5.4 oz), SpO2 100 %.   Intake/Output Summary (Last 24 hours) at 02/19/16 0913 Last data filed at 02/18/16 1703  Gross per 24 hour  Intake          1283.33 ml  Output                0 ml  Net          1283.33 ml    Exam Awake Alert, Oriented x 3, No new F.N deficits, Normal affect [place].AT,PERRAL Supple Neck,No JVD, No cervical lymphadenopathy appriciated.  Symmetrical Chest wall movement, Good air movement bilaterally, CTAB RRR,No Gallops,Rubs or new Murmurs, No  Parasternal Heave +ve Salazar.Sounds, Abd Soft, Non tender, No organomegaly appriciated, No rebound -guarding or rigidity. No Cyanosis, Clubbing or edema, No new Rash or bruise   Data Review   CBC w Diff: Lab Results    Component Value Date   WBC 12.3 (H) 02/18/2016   HGB 8.5 (L) 02/19/2016   HCT 27.7 (L) 02/19/2016   PLT 259 02/18/2016   LYMPHOPCT 6 02/17/2016   MONOPCT 0 02/17/2016   EOSPCT 0 02/17/2016   BASOPCT 0 02/17/2016    CMP: Lab Results  Component Value Date   NA 136 02/18/2016   K 3.5 02/18/2016   CL 105 02/18/2016   CO2 27 02/18/2016   BUN 37 (H) 02/18/2016   CREATININE 0.93 02/18/2016   PROT 5.7 (L) 02/13/2016   ALBUMIN 3.3 (L) 02/13/2016   BILITOT 0.4 02/13/2016   ALKPHOS 68 02/13/2016   AST 19 02/13/2016   ALT 27 02/13/2016  .   Total Time in preparing paper work, data evaluation and todays exam - 35 minutes  Thurnell Lose M.D on 02/19/2016 at 9:13 AM  [facility]   Office  [phone]

## 2016-02-19 NOTE — Progress Notes (Signed)
Lindsay Salazar discharged Home per MD order.  Discharge instructions reviewed and discussed with the patient, all questions and concerns answered. Copy of instructions given to patient.  Allergies as of 02/19/2016      Reactions   Ibuprofen Nausea And Vomiting   Penicillins Swelling   Has patient had a PCN reaction causing immediate rash, facial/tongue/throat swelling, SOB or lightheadedness with hypotension: Yes Has patient had a PCN reaction causing severe rash involving mucus membranes or skin necrosis: No Has patient had a PCN reaction that required hospitalization No Has patient had a PCN reaction occurring within the last 10 years: No If all of the above answers are "NO", then may proceed with Cephalosporin use.   Tiotropium Bromide Monohydrate Itching   Tramadol Nausea And Vomiting      Medication List    TAKE these medications   ABRAXANE IV Inject into the vein. Day 1, day 8, day 15, every 28 days   COMBIVENT RESPIMAT 20-100 MCG/ACT Aers respimat Generic drug:  Ipratropium-Albuterol Inhale 1 puff into the lungs 2 (two) times daily.   diltiazem 360 MG 24 hr capsule Commonly known as:  CARDIZEM CD Take 1 capsule (360 mg total) by mouth daily.   docusate sodium 100 MG capsule Commonly known as:  COLACE Take 1 capsule (100 mg total) by mouth 2 (two) times daily.   escitalopram 10 MG tablet Commonly known as:  LEXAPRO Take 1 tablet (10 mg total) by mouth every evening.   GEMZAR IV Inject into the vein. Day 1, day 8, day 15, every 28 days   HYDROcodone-acetaminophen 5-325 MG tablet Commonly known as:  NORCO/VICODIN Take 1 tablet by mouth every 6 (six) hours as needed for moderate pain.   ipratropium 0.02 % nebulizer solution Commonly known as:  ATROVENT Take 2.5 mLs (0.5 mg total) by nebulization 3 (three) times daily.   levalbuterol 1.25 MG/0.5ML nebulizer solution Commonly known as:  XOPENEX Take 1.25 mg by nebulization every 4 (four) hours as needed for wheezing or  shortness of breath.   metoprolol tartrate 25 MG tablet Commonly known as:  LOPRESSOR Take 1 tablet (25 mg total) by mouth 2 (two) times daily.   nicotine 14 mg/24hr patch Commonly known as:  NICODERM CQ - dosed in mg/24 hours Place 1 patch (14 mg total) onto the skin daily.   ondansetron 8 MG tablet Commonly known as:  ZOFRAN Take 1 tablet by mouth daily as needed for nausea/vomiting.   pantoprazole 40 MG tablet Commonly known as:  PROTONIX Take 1 tablet (40 mg total) by mouth daily.   predniSONE 10 MG tablet Commonly known as:  DELTASONE Take 40mg  po daily for 3 days then 30mg  daily for 3 days then 20mg  daily for 3 days then 10mg  daily for 3 days   prochlorperazine 10 MG tablet Commonly known as:  COMPAZINE Take 1 tablet by mouth daily as needed for nausea/vomiting.   rivaroxaban 20 MG Tabs tablet Commonly known as:  XARELTO Take 1 tablet (20 mg total) by mouth daily with supper.   SYMBICORT 160-4.5 MCG/ACT inhaler Generic drug:  budesonide-formoterol Inhale 1 puff into the lungs 2 (two) times daily.       IV site discontinued and catheter remains intact. Site without signs and symptoms of complications. Dressing and pressure applied.  Patient escorted to car by NT in a wheelchair,  no distress noted upon discharge.  Ralene Muskrat Gina Leblond 02/19/2016 11:27 AM

## 2016-02-19 NOTE — Discharge Instructions (Signed)
Follow with Primary MD BAUCOM, JENNY B, PA-C in 2-3 days   Get CBC, CMP, 2 view Chest X ray checked  by Primary MD  in 2-3 days ( we routinely change or add medications that can affect your baseline labs and fluid status, therefore we recommend that you get the mentioned basic workup next visit with your PCP, your PCP may decide not to get them or add new tests based on their clinical decision)   Activity: As tolerated with Full fall precautions use walker/cane & assistance as needed   Disposition Home    Diet:  Heart Healthy    For Heart failure patients - Check your Weight same time everyday, if you gain over 2 pounds, or you develop in leg swelling, experience more shortness of breath or chest pain, call your Primary MD immediately. Follow Cardiac Low Salt Diet and 1.5 lit/day fluid restriction.   On your next visit with your primary care physician please Get Medicines reviewed and adjusted.   Please request your Prim.MD to go over all Hospital Tests and Procedure/Radiological results at the follow up, please get all Hospital records sent to your Prim MD by signing hospital release before you go home.   If you experience worsening of your admission symptoms, develop shortness of breath, life threatening emergency, suicidal or homicidal thoughts you must seek medical attention immediately by calling 911 or calling your MD immediately  if symptoms less severe.  You Must read complete instructions/literature along with all the possible adverse reactions/side effects for all the Medicines you take and that have been prescribed to you. Take any new Medicines after you have completely understood and accpet all the possible adverse reactions/side effects.   Do not drive, operate heavy machinery, perform activities at heights, swimming or participation in water activities or provide baby sitting services if your were admitted for syncope or siezures until you have seen by Primary MD or a  Neurologist and advised to do so again.  Do not drive when taking Pain medications.    Do not take more than prescribed Pain, Sleep and Anxiety Medications  Special Instructions: If you have smoked or chewed Tobacco  in the last 2 yrs please stop smoking, stop any regular Alcohol  and or any Recreational drug use.  Wear Seat belts while driving.   Please note  You were cared for by a hospitalist during your hospital stay. If you have any questions about your discharge medications or the care you received while you were in the hospital after you are discharged, you can call the unit and asked to speak with the hospitalist on call if the hospitalist that took care of you is not available. Once you are discharged, your primary care physician will handle any further medical issues. Please note that NO REFILLS for any discharge medications will be authorized once you are discharged, as it is imperative that you return to your primary care physician (or establish a relationship with a primary care physician if you do not have one) for your aftercare needs so that they can reassess your need for medications and monitor your lab values.

## 2016-02-20 ENCOUNTER — Ambulatory Visit: Payer: Medicare HMO | Admitting: Orthopaedic Surgery

## 2016-02-20 ENCOUNTER — Ambulatory Visit (HOSPITAL_COMMUNITY): Payer: Medicare HMO

## 2016-02-22 ENCOUNTER — Ambulatory Visit (HOSPITAL_COMMUNITY): Payer: Medicare HMO

## 2016-02-22 LAB — TYPE AND SCREEN
Blood Product Expiration Date: 201801232359
Blood Product Expiration Date: 201801232359
Blood Product Expiration Date: 201801312359
UNIT TYPE AND RH: 5100
UNIT TYPE AND RH: 9500
Unit Type and Rh: 9500

## 2016-02-23 ENCOUNTER — Emergency Department (HOSPITAL_COMMUNITY): Payer: Medicare HMO

## 2016-02-23 ENCOUNTER — Encounter (HOSPITAL_COMMUNITY): Payer: Self-pay | Admitting: Emergency Medicine

## 2016-02-23 ENCOUNTER — Emergency Department (HOSPITAL_COMMUNITY)
Admission: EM | Admit: 2016-02-23 | Discharge: 2016-02-23 | Disposition: A | Discharge status: Home / Self Care | Payer: Medicare HMO | Attending: Emergency Medicine | Admitting: Emergency Medicine

## 2016-02-23 ENCOUNTER — Other Ambulatory Visit: Payer: Self-pay

## 2016-02-23 DIAGNOSIS — D696 Thrombocytopenia, unspecified: Secondary | ICD-10-CM

## 2016-02-23 DIAGNOSIS — C257 Malignant neoplasm of other parts of pancreas: Secondary | ICD-10-CM | POA: Diagnosis not present

## 2016-02-23 DIAGNOSIS — Z87891 Personal history of nicotine dependence: Secondary | ICD-10-CM | POA: Insufficient documentation

## 2016-02-23 DIAGNOSIS — I10 Essential (primary) hypertension: Secondary | ICD-10-CM | POA: Diagnosis not present

## 2016-02-23 DIAGNOSIS — J45909 Unspecified asthma, uncomplicated: Secondary | ICD-10-CM | POA: Insufficient documentation

## 2016-02-23 DIAGNOSIS — Z79899 Other long term (current) drug therapy: Secondary | ICD-10-CM | POA: Insufficient documentation

## 2016-02-23 DIAGNOSIS — R072 Precordial pain: Secondary | ICD-10-CM

## 2016-02-23 DIAGNOSIS — R0789 Other chest pain: Secondary | ICD-10-CM

## 2016-02-23 DIAGNOSIS — J441 Chronic obstructive pulmonary disease with (acute) exacerbation: Secondary | ICD-10-CM | POA: Insufficient documentation

## 2016-02-23 LAB — CBC
HCT: 27.2 % — ABNORMAL LOW (ref 36.0–46.0)
Hemoglobin: 8.4 g/dL — ABNORMAL LOW (ref 12.0–15.0)
MCH: 27.5 pg (ref 26.0–34.0)
MCHC: 30.9 g/dL (ref 30.0–36.0)
MCV: 89.2 fL (ref 78.0–100.0)
PLATELETS: 109 10*3/uL — AB (ref 150–400)
RBC: 3.05 MIL/uL — AB (ref 3.87–5.11)
RDW: 22.4 % — ABNORMAL HIGH (ref 11.5–15.5)
WBC: 4.8 10*3/uL (ref 4.0–10.5)

## 2016-02-23 LAB — BASIC METABOLIC PANEL
Anion gap: 8 (ref 5–15)
BUN: 19 mg/dL (ref 6–20)
CALCIUM: 8.2 mg/dL — AB (ref 8.9–10.3)
CO2: 24 mmol/L (ref 22–32)
Chloride: 108 mmol/L (ref 101–111)
Creatinine, Ser: 0.97 mg/dL (ref 0.44–1.00)
GFR calc Af Amer: >60 mL/min (ref >60)
GFR, EST NON AFRICAN AMERICAN: 59 mL/min — AB (ref >60)
Glucose, Bld: 130 mg/dL — ABNORMAL HIGH (ref 65–99)
POTASSIUM: 3.6 mmol/L (ref 3.5–5.1)
SODIUM: 140 mmol/L (ref 135–145)

## 2016-02-23 LAB — TROPONIN I: Troponin I: <0.03 ng/mL (ref <0.03)

## 2016-02-23 MED ORDER — ALUM & MAG HYDROXIDE-SIMETH 200-200-20 MG/5ML PO SUSP
15.0000 mL | Freq: Once | ORAL | Status: AC
Start: 2016-02-23 — End: 2016-02-23
  Administered 2016-02-23: 15 mL via ORAL
  Filled 2016-02-23: qty 30

## 2016-02-23 MED ORDER — ACETAMINOPHEN 325 MG PO TABS
650.0000 mg | ORAL_TABLET | Freq: Once | ORAL | Status: AC
  Administered 2016-02-23: 650 mg via ORAL
  Filled 2016-02-23: qty 2

## 2016-02-23 MED ORDER — FAMOTIDINE 20 MG PO TABS
20.0000 mg | ORAL_TABLET | Freq: Once | ORAL | Status: AC
  Administered 2016-02-23: 20 mg via ORAL
  Filled 2016-02-23: qty 1

## 2016-02-23 NOTE — Discharge Instructions (Signed)
It was our pleasure to provide your ER care today - we hope that you feel better.  Continue protonix.  If gi symptoms, your may try pepcid and maalox as well.  For anemia, and low platelet count, follow up with your heme/onc doctor in the next 1-2 weeks.   For chest discomfort, also follow up with your doctor in the next 1-2 weeks.  Return to ER if worse, trouble breathing, recurrent/persistent chest pain, other concern.

## 2016-02-23 NOTE — ED Provider Notes (Signed)
[facility] DEPT Provider Note   CSN: [id number] Arrival date & time: 02/23/16  1257     History   Chief Complaint Chief Complaint  Patient presents with  . Chest Pain    HPI Lindsay Salazar is a 66 y.o. female.  Patient c/o midline, lower sternal area, chest discomfort for the past day. Occurs at rest. No relation to activity or exertion. Is not pleuritic. Does not radiate. Denies associated nv, diaphoresis or sob. States recent reflux symptoms, burning, belching. Denies abd pain. Has pancreatic ca, last chemo 2 weeks ago. No recent fever or chills. Denies cough or uri c/o. Compliant w normal meds. Denies leg pain or swelling.    The history is provided by the patient.  Chest Pain   Pertinent negatives include no abdominal pain, no back pain, no fever, no headaches, no shortness of breath and no vomiting.    Past Medical History:  Diagnosis Date  . Anginal pain ([place])    thought she was having   heartburn  . Arthritis    bursitis , fibromyalgia  . Arthritis    "right hip" (11/27/2015)  . Asthma   . Atrial fibrillation ([place])   . Chronic bronchitis ([place])   . Chronic right hip pain   . COPD (chronic obstructive pulmonary disease) ([place])    wears home o2 prn  . Daily headache    "last 2-3 months" (11/27/2015)  . Exposure to TB    "mom had it when she was pregnant with me"  . Fibromyalgia   . GERD (gastroesophageal reflux disease)    use to have it but not now  . Heart murmur    76-  55 years old  . Hypertension   . On home oxygen therapy    11/27/2015 "whenever I need it; don't know how many liters"  . Pancreatic cancer ([place])   . Pneumonia    "several times" (11/27/2015)  . Pneumothorax     Patient Active Problem List   Diagnosis Date Noted  . Rectal bleed 02/17/2016  . AKI (acute kidney injury) ([place]) 02/17/2016  . Chronic respiratory failure with hypoxia ([place]) 01/29/2016  . Malnutrition of moderate degree 01/29/2016  . Atrial fibrillation with rapid  ventricular response ([place]) 01/28/2016  . Hypertension 01/28/2016  . Anxiety 01/28/2016  . Iron deficiency anemia 01/24/2016  . B12 deficiency 01/24/2016  . COPD with acute exacerbation ([place]) 01/20/2016  . Pancreatic cancer ([place]) 12/07/2015  . Epigastric pain   . Abdominal pain 11/27/2015  . Hypokalemia 11/27/2015  . Leukocytosis 11/27/2015  . Chronic anticoagulation-Xarelto 09/06/2015  . Gastrointestinal hemorrhage with melena 08/08/2015  . Acute blood loss anemia 08/08/2015  . Chronic obstructive pulmonary disease (COPD) ([place]) 08/08/2015  . Chest pain 08/08/2015  . Hyperglycemia 07/17/2015  . Anemia 07/17/2015  . Atrial fibrillation ([place]) 07/17/2015  . Hypertensive urgency 06/27/2015  . Tobacco abuse 06/27/2015  . Elevated troponin 06/27/2015  . CAP (community acquired pneumonia)   . OSTEOPOROSIS 04/10/2009  . WEIGHT LOSS 04/10/2009    Past Surgical History:  Procedure Laterality Date  . ANTERIOR CERVICAL DECOMP/DISCECTOMY FUSION     "put 4 screws in"  . BACK SURGERY    . CARDIAC CATHETERIZATION  08/2004   Archie Endo 06/18/2010  . CARDIOVERSION N/A 07/18/2015   Procedure: CARDIOVERSION;  Surgeon: Josue Hector, MD;  [facility];  Service: Cardiovascular;  Laterality: N/A;  . ESOPHAGOGASTRODUODENOSCOPY N/A 08/09/2015   Procedure: ESOPHAGOGASTRODUODENOSCOPY (EGD);  Surgeon: Rogene Houston, MD;  Location: [facility];  Service: Endoscopy;  Laterality: N/A;  . EUS N/A 11/29/2015   Procedure: UPPER ENDOSCOPIC ULTRASOUND (EUS) RADIAL;  Surgeon: Milus Banister, MD;  Location: [facility];  Service: Endoscopy;  Laterality: N/A;  . IR GENERIC HISTORICAL  12/18/2015   IR US GUIDE VASC ACCESS RIGHT 12/18/2015 Sandi Mariscal, MD [facility]  . IR GENERIC HISTORICAL  12/18/2015   IR FLUORO GUIDE PORT INSERTION RIGHT 12/18/2015 Sandi Mariscal, MD [facility]  . TONSILLECTOMY    . TUBAL LIGATION      OB History    Gravida Para Term Preterm AB Living   2         2   SAB TAB  Ectopic Multiple Live Births                   Home Medications    Prior to Admission medications   Medication Sig Start Date End Date Taking? Authorizing Provider  COMBIVENT RESPIMAT 20-100 MCG/ACT AERS respimat Inhale 1 puff into the lungs 2 (two) times daily. 12/03/15   Ripudeep Krystal Eaton, MD  diltiazem (CARDIZEM CD) 360 MG 24 hr capsule Take 1 capsule (360 mg total) by mouth daily. 02/06/16   Kathie Dike, MD  docusate sodium (COLACE) 100 MG capsule Take 1 capsule (100 mg total) by mouth 2 (two) times daily. 02/19/16   Thurnell Lose, MD  escitalopram (LEXAPRO) 10 MG tablet Take 1 tablet (10 mg total) by mouth every evening. 12/03/15   Ripudeep Krystal Eaton, MD  Gemcitabine HCl (GEMZAR IV) Inject into the vein. Day 1, day 8, day 15, every 28 days    Historical Provider, MD  HYDROcodone-acetaminophen (NORCO/VICODIN) 5-325 MG tablet Take 1 tablet by mouth every 6 (six) hours as needed for moderate pain. 02/13/16   Baird Cancer, PA-C  ipratropium (ATROVENT) 0.02 % nebulizer solution Take 2.5 mLs (0.5 mg total) by nebulization 3 (three) times daily. 01/23/16   Orvan Falconer, MD  levalbuterol Penne Lash) 1.25 MG/0.5ML nebulizer solution Take 1.25 mg by nebulization every 4 (four) hours as needed for wheezing or shortness of breath. 02/05/16   Kathie Dike, MD  metoprolol tartrate (LOPRESSOR) 25 MG tablet Take 1 tablet (25 mg total) by mouth 2 (two) times daily. 02/05/16   Kathie Dike, MD  nicotine (NICODERM CQ - DOSED IN MG/24 HOURS) 14 mg/24hr patch Place 1 patch (14 mg total) onto the skin daily. 12/03/15   Ripudeep Krystal Eaton, MD  ondansetron (ZOFRAN) 8 MG tablet Take 1 tablet by mouth daily as needed for nausea/vomiting. 12/10/15   Historical Provider, MD  PACLitaxel Protein-Bound Part (ABRAXANE IV) Inject into the vein. Day 1, day 8, day 15, every 28 days    Historical Provider, MD  pantoprazole (PROTONIX) 40 MG tablet Take 1 tablet (40 mg total) by mouth daily. 12/03/15   Ripudeep Krystal Eaton, MD  predniSONE  (DELTASONE) 10 MG tablet Take 40mg  po daily for 3 days then 30mg  daily for 3 days then 20mg  daily for 3 days then 10mg  daily for 3 days 02/05/16   Kathie Dike, MD  prochlorperazine (COMPAZINE) 10 MG tablet Take 1 tablet by mouth daily as needed for nausea/vomiting. 12/10/15   Historical Provider, MD  rivaroxaban (XARELTO) 20 MG TABS tablet Take 1 tablet (20 mg total) by mouth daily with supper. 07/19/15   Reyne Dumas, MD  SYMBICORT 160-4.5 MCG/ACT inhaler Inhale 1 puff into the lungs 2 (two) times daily. 12/03/15   Ripudeep Krystal Eaton, MD    Family History Family  History  Problem Relation Age of Onset  . COPD Mother   . Diabetes Father   . Stroke Father     Social History Social History  Substance Use Topics  . Smoking status: Former Smoker    Packs/day: 0.50    Years: 46.00    Types: Cigarettes    Start date: 10/09/1969    Quit date: 11/29/2015  . Smokeless tobacco: Never Used  . Alcohol use No     Comment: quit 30 years ago     Allergies   Ibuprofen; Penicillins; Tiotropium bromide monohydrate; and Tramadol   Review of Systems Review of Systems  Constitutional: Negative for chills and fever.  HENT: Negative for sore throat.   Eyes: Negative for redness.  Respiratory: Negative for shortness of breath.   Cardiovascular: Positive for chest pain. Negative for leg swelling.  Gastrointestinal: Negative for abdominal pain and vomiting.  Genitourinary: Negative for flank pain.  Musculoskeletal: Negative for back pain and neck pain.  Skin: Negative for rash.  Neurological: Negative for headaches.  Hematological: Does not bruise/bleed easily.  Psychiatric/Behavioral: Negative for confusion.     Physical Exam Updated Vital Signs Pulse (!) 51   Temp 98.2 F (36.8 C) (Oral)   Resp 23   Ht 5\' 4"  (1.626 m)   Wt 56.2 kg   SpO2 99%   BMI 21.28 kg/m   Physical Exam  Constitutional: She appears well-developed and well-nourished. No distress.  HENT:  Mouth/Throat: Oropharynx  is clear and moist.  Eyes: Conjunctivae are normal. No scleral icterus.  Neck: Neck supple. No tracheal deviation present.  Cardiovascular: Normal rate, normal heart sounds and intact distal pulses.  Exam reveals no gallop and no friction rub.   No murmur heard. Pulmonary/Chest: Effort normal and breath sounds normal. No respiratory distress.  Abdominal: Soft. Normal appearance and bowel sounds are normal. She exhibits no distension. There is no tenderness.  Musculoskeletal: She exhibits no edema or tenderness.  Neurological: She is alert.  Skin: Skin is warm and dry. No rash noted.  Psychiatric: She has a normal mood and affect.  Nursing note and vitals reviewed.    ED Treatments / Results  Labs (all labs ordered are listed, but only abnormal results are displayed) Results for orders placed or performed during the hospital encounter of 02/23/16  Troponin I  Result Value Ref Range   Troponin I <0.03 <0.03 ng/mL  CBC  Result Value Ref Range   WBC 4.8 4.0 - 10.5 K/uL   RBC 3.05 (L) 3.87 - 5.11 MIL/uL   Hemoglobin 8.4 (L) 12.0 - 15.0 g/dL   HCT 27.2 (L) 36.0 - 46.0 %   MCV 89.2 78.0 - 100.0 fL   MCH 27.5 26.0 - 34.0 pg   MCHC 30.9 30.0 - 36.0 g/dL   RDW 22.4 (H) 11.5 - 15.5 %   Platelets 109 (L) 150 - 400 K/uL  Basic metabolic panel  Result Value Ref Range   Sodium 140 135 - 145 mmol/L   Potassium 3.6 3.5 - 5.1 mmol/L   Chloride 108 101 - 111 mmol/L   CO2 24 22 - 32 mmol/L   Glucose, Bld 130 (H) 65 - 99 mg/dL   BUN 19 6 - 20 mg/dL   Creatinine, Ser 0.97 0.44 - 1.00 mg/dL   Calcium 8.2 (L) 8.9 - 10.3 mg/dL   GFR calc non Af Amer 59 (L) >60 mL/min   GFR calc Af Amer >60 >60 mL/min   Anion gap 8 5 - 15  Dg Chest 2 View  Result Date: 02/23/2016 CLINICAL DATA:  Chest pain.  Cough. EXAM: CHEST  2 VIEW COMPARISON:  February 11, 2016 FINDINGS: Stable right Port-A-Cath. The heart, hila, mediastinum, lungs, and pleura are otherwise unchanged and unremarkable. IMPRESSION: No active  cardiopulmonary disease. Electronically Signed   By: Dorise Bullion III M.D   On: 02/23/2016 13:56    EKG  EKG Interpretation  Date/Time:  Saturday February 23 2016 13:05:33 EST Ventricular Rate:  71 PR Interval:    QRS Duration: 81 QT Interval:  447 QTC Calculation: 486 R Axis:   56 Text Interpretation:  Atrial fibrillation Non-specific ST-t changes No significant change since last tracing Confirmed by Ashok Cordia  MD, Lennette Bihari ([id number]) on 02/23/2016 1:12:22 PM       Radiology No results found.  Procedures Procedures (including critical care time)  Medications Ordered in ED Medications  acetaminophen (TYLENOL) tablet 650 mg (not administered)  famotidine (PEPCID) tablet 20 mg (not administered)  alum & mag hydroxide-simeth (MAALOX/MYLANTA) 200-200-20 MG/5ML suspension 15 mL (not administered)     Initial Impression / Assessment and Plan / ED Course  I have reviewed the triage vital signs and the nursing notes.  Pertinent labs & imaging results that were available during my care of the patient were reviewed by me and considered in my medical decision making (see chart for details).  Labs. Cxr. Ecg.  Will try tylenol, pepcid and maalox for symptom relief.  Prior cardiac cath without signif cad.   Reviewed nursing notes and prior charts for additional history.   Recheck, watching tv, talking on phone, chest discomfort resolved. No sob.  After symptoms for past day,  Trop normal.   Patient currently appears stable for d/c.   Rec close pcp f/u.     Final Clinical Impressions(s) / ED Diagnoses   Final diagnoses:  None    New Prescriptions New Prescriptions   No medications on file     Lajean Saver, MD 02/23/16 1455

## 2016-02-23 NOTE — ED Triage Notes (Signed)
Chest pain started this morning around 0930, had some sweating as well.

## 2016-02-26 ENCOUNTER — Encounter: Payer: Self-pay | Admitting: Adult Health

## 2016-02-26 ENCOUNTER — Encounter: Payer: Medicare HMO | Admitting: Adult Health

## 2016-02-26 ENCOUNTER — Ambulatory Visit (INDEPENDENT_AMBULATORY_CARE_PROVIDER_SITE_OTHER): Payer: Medicare HMO | Admitting: Orthopaedic Surgery

## 2016-02-26 VITALS — BP 122/68 | HR 94 | Temp 98.1°F | Ht 64.0 in

## 2016-02-26 DIAGNOSIS — M25551 Pain in right hip: Secondary | ICD-10-CM | POA: Diagnosis not present

## 2016-02-26 MED ORDER — HYDROCODONE-ACETAMINOPHEN 5-325 MG PO TABS: 1.0000 | ORAL_TABLET | ORAL | 0 refills | Status: DC | PRN

## 2016-02-26 NOTE — Progress Notes (Signed)
Patient ID:Lindsay Salazar, female DOB:November 05, 1950, 66 y.o. [id number]  Chief Complaint  Patient presents with  . Follow-up    Right Hip Pain    HPI  Lindsay Salazar is a 66 y.o. female who has chronic pain of the hip on the right.  She has no new trauma.  She is being treated for cancer.  She has pain with walking.  She is in a wheelchair today. HPI  There is no height or weight on file to calculate BMI.  ROS  Review of Systems  HENT: Negative for congestion.   Respiratory: Positive for shortness of breath. Negative for cough.   Cardiovascular: Positive for chest pain and leg swelling.  Endocrine: Positive for cold intolerance.  Musculoskeletal: Positive for arthralgias and gait problem.  Allergic/Immunologic: Positive for environmental allergies.    Past Medical History:  Diagnosis Date  . Anginal pain ([place])    thought she was having   heartburn  . Arthritis    bursitis , fibromyalgia  . Arthritis    "right hip" (11/27/2015)  . Asthma   . Atrial fibrillation ([place])   . Chronic bronchitis ([place])   . Chronic right hip pain   . COPD (chronic obstructive pulmonary disease) ([place])    wears home o2 prn  . Daily headache    "last 2-3 months" (11/27/2015)  . Exposure to TB    "mom had it when she was pregnant with me"  . Fibromyalgia   . GERD (gastroesophageal reflux disease)    use to have it but not now  . Heart murmur    48-  15 years old  . Hypertension   . On home oxygen therapy    11/27/2015 "whenever I need it; don't know how many liters"  . Pancreatic cancer ([place])   . Pneumonia    "several times" (11/27/2015)  . Pneumothorax     Past Surgical History:  Procedure Laterality Date  . ANTERIOR CERVICAL DECOMP/DISCECTOMY FUSION     "put 4 screws in"  . BACK SURGERY    . CARDIAC CATHETERIZATION  08/2004   Archie Endo 06/18/2010  . CARDIOVERSION N/A 07/18/2015   Procedure: CARDIOVERSION;  Surgeon: Josue Hector, MD;  [facility];  Service: Cardiovascular;   Laterality: N/A;  . ESOPHAGOGASTRODUODENOSCOPY N/A 08/09/2015   Procedure: ESOPHAGOGASTRODUODENOSCOPY (EGD);  Surgeon: Rogene Houston, MD;  Location[facility];  Service: Endoscopy;  Laterality: N/A;  . EUS N/A 11/29/2015   Procedure: UPPER ENDOSCOPIC ULTRASOUND (EUS) RADIAL;  Surgeon: Milus Banister, MD;  [facility];  Service: Endoscopy;  Laterality: N/A;  . IR GENERIC HISTORICAL  12/18/2015   IR US GUIDE VASC ACCESS RIGHT 12/18/2015 Sandi Mariscal, MD [facility]  . IR GENERIC HISTORICAL  12/18/2015   IR FLUORO GUIDE PORT INSERTION RIGHT 12/18/2015 Sandi Mariscal, MD [facility]  . TONSILLECTOMY    . TUBAL LIGATION      Family History  Problem Relation Age of Onset  . COPD Mother   . Diabetes Father   . Stroke Father     Social History Social History  Substance Use Topics  . Smoking status: Former Smoker    Packs/day: 0.50    Years: 46.00    Types: Cigarettes    Start date: 10/09/1969    Quit date: 11/29/2015  . Smokeless tobacco: Never Used  . Alcohol use No     Comment: quit 30 years ago    Allergies  Allergen Reactions  . Ibuprofen Nausea And Vomiting  .  Penicillins Swelling    Has patient had a PCN reaction causing immediate rash, facial/tongue/throat swelling, SOB or lightheadedness with hypotension: Yes Has patient had a PCN reaction causing severe rash involving mucus membranes or skin necrosis: No Has patient had a PCN reaction that required hospitalization No Has patient had a PCN reaction occurring within the last 10 years: No If all of the above answers are "NO", then may proceed with Cephalosporin use.   . Tiotropium Bromide Monohydrate Itching  . Tramadol Nausea And Vomiting    Current Outpatient Prescriptions  Medication Sig Dispense Refill  . COMBIVENT RESPIMAT 20-100 MCG/ACT AERS respimat Inhale 1 puff into the lungs 2 (two) times daily. 1 Inhaler 5  . diltiazem (CARDIZEM CD) 360 MG 24 hr capsule Take 1 capsule (360 mg total) by mouth  daily. 60 capsule 1  . docusate sodium (COLACE) 100 MG capsule Take 1 capsule (100 mg total) by mouth 2 (two) times daily. 30 capsule 0  . escitalopram (LEXAPRO) 10 MG tablet Take 1 tablet (10 mg total) by mouth every evening. 30 tablet 0  . Gemcitabine HCl (GEMZAR IV) Inject into the vein. Day 1, day 8, day 15, every 28 days    . HYDROcodone-acetaminophen (NORCO/VICODIN) 5-325 MG tablet Take 1 tablet by mouth every 4 (four) hours as needed for moderate pain (Must last 14 days.Do not take and drive a car or use machinery.). 56 tablet 0  . ipratropium (ATROVENT) 0.02 % nebulizer solution Take 2.5 mLs (0.5 mg total) by nebulization 3 (three) times daily. 75 mL 12  . levalbuterol (XOPENEX) 1.25 MG/0.5ML nebulizer solution Take 1.25 mg by nebulization every 4 (four) hours as needed for wheezing or shortness of breath. 1 each 12  . metoprolol tartrate (LOPRESSOR) 25 MG tablet Take 1 tablet (25 mg total) by mouth 2 (two) times daily. 60 tablet 0  . nicotine (NICODERM CQ - DOSED IN MG/24 HOURS) 14 mg/24hr patch Place 1 patch (14 mg total) onto the skin daily. 28 patch 3  . ondansetron (ZOFRAN) 8 MG tablet Take 1 tablet by mouth daily as needed for nausea/vomiting.    Marland Kitchen PACLitaxel Protein-Bound Part (ABRAXANE IV) Inject into the vein. Day 1, day 8, day 15, every 28 days    . pantoprazole (PROTONIX) 40 MG tablet Take 1 tablet (40 mg total) by mouth daily. 60 tablet 3  . predniSONE (DELTASONE) 10 MG tablet Take 40mg  po daily for 3 days then 30mg  daily for 3 days then 20mg  daily for 3 days then 10mg  daily for 3 days (Patient not taking: Reported on 02/23/2016) 30 tablet 0  . prochlorperazine (COMPAZINE) 10 MG tablet Take 1 tablet by mouth daily as needed for nausea/vomiting.    . rivaroxaban (XARELTO) 20 MG TABS tablet Take 1 tablet (20 mg total) by mouth daily with supper. (Patient not taking: Reported on 02/23/2016) 30 tablet 10  . SYMBICORT 160-4.5 MCG/ACT inhaler Inhale 1 puff into the lungs 2 (two) times  daily. 1 Inhaler 12   No current facility-administered medications for this visit.      Physical Exam  Blood pressure 122/68, pulse 94, temperature 98.1 F (36.7 C), height 5\' 4"  (1.626 m).  Constitutional: overall normal hygiene, normal nutrition, well developed, normal grooming, normal body habitus. Assistive device:wheelchair  Musculoskeletal: gait and station Limp difficulty in standing, muscle tone and strength are normal, no tremors or atrophy is present.  .  Neurological: coordination overall normal.  Deep tendon reflex/nerve stretch intact.  Sensation normal.  Cranial nerves  II-XII intact.   Skin:   Normal overall no scars, lesions, ulcers or rashes. No psoriasis.  Psychiatric: Alert and oriented x 3.  Recent memory intact, remote memory unclear.  Normal mood and affect. Well groomed.  Good eye contact.  Cardiovascular: overall no swelling, no varicosities, no edema bilaterally, normal temperatures of the legs and arms, no clubbing, cyanosis and good capillary refill.  Lymphatic: palpation is normal.  Right hip is tender and has limited motion.  NV intact.  Leg lengths equal.  The patient has been educated about the nature of the problem(s) and counseled on treatment options.  The patient appeared to understand what I have discussed and is in agreement with it.  Encounter Diagnosis  Name Primary?  . Right hip pain Yes    PLAN Call if any problems.  Precautions discussed.  Continue current medications.   Return to clinic 3 months   Electronically Signed Sanjuana Kava, MD 1/23/20182:46 PM

## 2016-02-26 NOTE — Progress Notes (Deleted)
Cardiology Office Note   Date:  02/26/2016   ID:  Lindsay Salazar, DOB [birth year], MRN [id number]  PCP:  Lindsay Fairy, PA-C  Cardiologist: Lindsay Spring, NP   No chief complaint on file.     History of Present Illness: Lindsay Salazar is a 66 y.o. female who presents for ongoing assessment and management of paroxysmal atrial fibrillation, status post DCCV June 2017, COPD, history of GI bleed related to heavy NSAID use, history of pancreatic cancer, and hypertension. The patient was recently discharged from a hospital on 02/19/2016 after admission for GI bleed on Xarelto. This felt that the GI bleed was related to hemorrhoids. He was continued on anticoagulation therapy and is here for post hospitalization follow-up.   Past Medical History:  Diagnosis Date  . Anginal pain ([place])    thought she was having   heartburn  . Arthritis    bursitis , fibromyalgia  . Arthritis    "right hip" (11/27/2015)  . Asthma   . Atrial fibrillation ([place])   . Chronic bronchitis ([place])   . Chronic right hip pain   . COPD (chronic obstructive pulmonary disease) ([place])    wears home o2 prn  . Daily headache    "last 2-3 months" (11/27/2015)  . Exposure to TB    "mom had it when she was pregnant with me"  . Fibromyalgia   . GERD (gastroesophageal reflux disease)    use to have it but not now  . Heart murmur    32-  40 years old  . Hypertension   . On home oxygen therapy    11/27/2015 "whenever I need it; don't know how many liters"  . Pancreatic cancer ([place])   . Pneumonia    "several times" (11/27/2015)  . Pneumothorax     Past Surgical History:  Procedure Laterality Date  . ANTERIOR CERVICAL DECOMP/DISCECTOMY FUSION     "put 4 screws in"  . BACK SURGERY    . CARDIAC CATHETERIZATION  08/2004   Archie Endo 06/18/2010  . CARDIOVERSION N/A 07/18/2015   Procedure: CARDIOVERSION;  Surgeon: Lindsay Hector, MD;  [facility];  Service: Cardiovascular;  Laterality: N/A;  .  ESOPHAGOGASTRODUODENOSCOPY N/A 08/09/2015   Procedure: ESOPHAGOGASTRODUODENOSCOPY (EGD);  Surgeon: Lindsay Houston, MD;  Location: [facility];  Service: Endoscopy;  Laterality: N/A;  . EUS N/A 11/29/2015   Procedure: UPPER ENDOSCOPIC ULTRASOUND (EUS) RADIAL;  Surgeon: Lindsay Banister, MD;  Location: [facility];  Service: Endoscopy;  Laterality: N/A;  . IR GENERIC HISTORICAL  12/18/2015   IR US GUIDE VASC ACCESS RIGHT 12/18/2015 Sandi Mariscal, MD [facility]  . IR GENERIC HISTORICAL  12/18/2015   IR FLUORO GUIDE PORT INSERTION RIGHT 12/18/2015 Sandi Mariscal, MD [facility]  . TONSILLECTOMY    . TUBAL LIGATION       Current Outpatient Prescriptions  Medication Sig Dispense Refill  . COMBIVENT RESPIMAT 20-100 MCG/ACT AERS respimat Inhale 1 puff into the lungs 2 (two) times daily. 1 Inhaler 5  . diltiazem (CARDIZEM CD) 360 MG 24 hr capsule Take 1 capsule (360 mg total) by mouth daily. 60 capsule 1  . docusate sodium (COLACE) 100 MG capsule Take 1 capsule (100 mg total) by mouth 2 (two) times daily. 30 capsule 0  . escitalopram (LEXAPRO) 10 MG tablet Take 1 tablet (10 mg total) by mouth every evening. 30 tablet 0  . Gemcitabine HCl (GEMZAR IV) Inject into the vein. Day 1, day 8, day 15, every  28 days    . HYDROcodone-acetaminophen (NORCO/VICODIN) 5-325 MG tablet Take 1 tablet by mouth every 6 (six) hours as needed for moderate pain. 30 tablet 0  . ipratropium (ATROVENT) 0.02 % nebulizer solution Take 2.5 mLs (0.5 mg total) by nebulization 3 (three) times daily. 75 mL 12  . levalbuterol (XOPENEX) 1.25 MG/0.5ML nebulizer solution Take 1.25 mg by nebulization every 4 (four) hours as needed for wheezing or shortness of breath. 1 each 12  . metoprolol tartrate (LOPRESSOR) 25 MG tablet Take 1 tablet (25 mg total) by mouth 2 (two) times daily. 60 tablet 0  . nicotine (NICODERM CQ - DOSED IN MG/24 HOURS) 14 mg/24hr patch Place 1 patch (14 mg total) onto the skin daily. 28 patch 3  . ondansetron (ZOFRAN)  8 MG tablet Take 1 tablet by mouth daily as needed for nausea/vomiting.    Marland Kitchen PACLitaxel Protein-Bound Part (ABRAXANE IV) Inject into the vein. Day 1, day 8, day 15, every 28 days    . pantoprazole (PROTONIX) 40 MG tablet Take 1 tablet (40 mg total) by mouth daily. 60 tablet 3  . predniSONE (DELTASONE) 10 MG tablet Take 40mg  po daily for 3 days then 30mg  daily for 3 days then 20mg  daily for 3 days then 10mg  daily for 3 days (Patient not taking: Reported on 02/23/2016) 30 tablet 0  . prochlorperazine (COMPAZINE) 10 MG tablet Take 1 tablet by mouth daily as needed for nausea/vomiting.    . rivaroxaban (XARELTO) 20 MG TABS tablet Take 1 tablet (20 mg total) by mouth daily with supper. (Patient not taking: Reported on 02/23/2016) 30 tablet 10  . SYMBICORT 160-4.5 MCG/ACT inhaler Inhale 1 puff into the lungs 2 (two) times daily. 1 Inhaler 12   No current facility-administered medications for this visit.     Allergies:   Ibuprofen; Penicillins; Tiotropium bromide monohydrate; and Tramadol    Social History:  The patient  reports that she quit smoking about 2 months ago. Her smoking use included Cigarettes. She started smoking about 46 years ago. She has a 23.00 pack-year smoking history. She has never used smokeless tobacco. She reports that she does not drink alcohol or use drugs.   Family History:  The patient's family history includes COPD in her mother; Diabetes in her father; Stroke in her father.    ROS: All other systems are reviewed and negative. Unless otherwise mentioned in H&P    PHYSICAL EXAM: VS:  There were no vitals taken for this visit. , BMI There is no height or weight on file to calculate BMI. GEN: Well nourished, well developed, in no acute distress HEENT: normal Neck: no JVD, carotid bruits, or masses Cardiac: ***RRR; no murmurs, rubs, or gallops,no edema  Respiratory:  clear to auscultation bilaterally, normal work of breathing GI: soft, nontender, nondistended, + BS MS: no  deformity or atrophy Skin: warm and dry, no rash Neuro:  Strength and sensation are intact Psych: euthymic mood, full affect   EKG:  EKG {ACTION; IS/IS [id number] ordered today. The ekg ordered today demonstrates ***   Recent Labs: 06/27/2015: TSH 2.678 01/28/2016: B Natriuretic Peptide 113.0; Magnesium 2.9 02/13/2016: ALT 27 02/23/2016: BUN 19; Creatinine, Ser 0.97; Hemoglobin 8.4; Platelets 109; Potassium 3.6; Sodium 140    Lipid Panel No results found for: CHOL, TRIG, HDL, CHOLHDL, VLDL, LDLCALC, LDLDIRECT    Wt Readings from Last 3 Encounters:  02/23/16 124 lb (56.2 kg)  02/17/16 124 lb 5.4 oz (56.4 kg)  02/13/16 120 lb 12.8 oz (54.8 kg)  Other studies Reviewed: Additional studies/ records that were reviewed today include: ***. Review of the above records demonstrates: ***   ASSESSMENT AND PLAN:  1.  ***   Current medicines are reviewed at length with the patient today.    Labs/ tests ordered today include: *** No orders of the defined types were placed in this encounter.    Disposition:   FU with *** in {gen number [id number] {TIME; UNITS DAY/WEEK/MONTH:19136}   Signed, Jory Sims, NP  02/26/2016 7:20 AM    [facility] [address] [place], [place] [address] Phone: [phone]; Fax: [phone]

## 2016-02-27 ENCOUNTER — Encounter (HOSPITAL_BASED_OUTPATIENT_CLINIC_OR_DEPARTMENT_OTHER): Payer: Medicare HMO

## 2016-02-27 ENCOUNTER — Encounter (HOSPITAL_COMMUNITY): Payer: Self-pay | Admitting: Oncology

## 2016-02-27 ENCOUNTER — Encounter (HOSPITAL_BASED_OUTPATIENT_CLINIC_OR_DEPARTMENT_OTHER): Payer: Medicare HMO | Admitting: Oncology

## 2016-02-27 VITALS — BP 100/54 | HR 60 | Temp 98.3°F | Resp 20

## 2016-02-27 DIAGNOSIS — C257 Malignant neoplasm of other parts of pancreas: Secondary | ICD-10-CM

## 2016-02-27 DIAGNOSIS — Z5111 Encounter for antineoplastic chemotherapy: Secondary | ICD-10-CM | POA: Diagnosis not present

## 2016-02-27 DIAGNOSIS — E876 Hypokalemia: Secondary | ICD-10-CM

## 2016-02-27 DIAGNOSIS — C251 Malignant neoplasm of body of pancreas: Secondary | ICD-10-CM

## 2016-02-27 LAB — CBC WITH DIFFERENTIAL/PLATELET
Basophils Absolute: 0 10*3/uL (ref 0.0–0.1)
Basophils Relative: 0 %
EOS PCT: 1 %
Eosinophils Absolute: 0.1 10*3/uL (ref 0.0–0.7)
HCT: 29.1 % — ABNORMAL LOW (ref 36.0–46.0)
Hemoglobin: 8.9 g/dL — ABNORMAL LOW (ref 12.0–15.0)
Lymphocytes Relative: 24 %
Lymphs Abs: 1.6 10*3/uL (ref 0.7–4.0)
MCH: 28.6 pg (ref 26.0–34.0)
MCHC: 30.6 g/dL (ref 30.0–36.0)
MCV: 93.6 fL (ref 78.0–100.0)
MONO ABS: 0.8 10*3/uL (ref 0.1–1.0)
MONOS PCT: 12 %
NEUTROS PCT: 63 %
Neutro Abs: 4.2 10*3/uL (ref 1.7–7.7)
PLATELETS: 189 10*3/uL (ref 150–400)
RBC: 3.11 MIL/uL — AB (ref 3.87–5.11)
RDW: 25 % — ABNORMAL HIGH (ref 11.5–15.5)
WBC: 6.7 10*3/uL (ref 4.0–10.5)

## 2016-02-27 LAB — COMPREHENSIVE METABOLIC PANEL
ALBUMIN: 3.3 g/dL — AB (ref 3.5–5.0)
ALT: 31 U/L (ref 14–54)
AST: 18 U/L (ref 15–41)
Alkaline Phosphatase: 81 U/L (ref 38–126)
Anion gap: 9 (ref 5–15)
BUN: 23 mg/dL — AB (ref 6–20)
CHLORIDE: 105 mmol/L (ref 101–111)
CO2: 23 mmol/L (ref 22–32)
CREATININE: 0.76 mg/dL (ref 0.44–1.00)
Calcium: 8.1 mg/dL — ABNORMAL LOW (ref 8.9–10.3)
GFR calc Af Amer: >60 mL/min (ref >60)
GLUCOSE: 104 mg/dL — AB (ref 65–99)
Potassium: 2.7 mmol/L — CL (ref 3.5–5.1)
Sodium: 137 mmol/L (ref 135–145)
Total Bilirubin: 0.4 mg/dL (ref 0.3–1.2)
Total Protein: 6 g/dL — ABNORMAL LOW (ref 6.5–8.1)

## 2016-02-27 MED ORDER — PROCHLORPERAZINE MALEATE 10 MG PO TABS
10.0000 mg | ORAL_TABLET | Freq: Once | ORAL | Status: AC
  Administered 2016-02-27: 10 mg via ORAL
  Filled 2016-02-27: qty 1

## 2016-02-27 MED ORDER — SODIUM CHLORIDE 0.9 % IV SOLN
965.0000 mg/m2 | Freq: Once | INTRAVENOUS | Status: AC
  Administered 2016-02-27: 1596 mg via INTRAVENOUS
  Filled 2016-02-27: qty 15.78

## 2016-02-27 MED ORDER — POTASSIUM CHLORIDE CRYS ER 20 MEQ PO TBCR
60.0000 meq | EXTENDED_RELEASE_TABLET | Freq: Once | ORAL | Status: AC
  Administered 2016-02-27: 60 meq via ORAL
  Filled 2016-02-27: qty 3

## 2016-02-27 MED ORDER — POTASSIUM CHLORIDE 10 MEQ/100ML IV SOLN
10.0000 meq | INTRAVENOUS | Status: AC
  Administered 2016-02-27 (×3): 10 meq via INTRAVENOUS
  Filled 2016-02-27 (×4): qty 100

## 2016-02-27 MED ORDER — SODIUM CHLORIDE 0.9 % IV SOLN
Freq: Once | INTRAVENOUS | Status: AC
  Administered 2016-02-27: 13:00:00 via INTRAVENOUS

## 2016-02-27 MED ORDER — SODIUM CHLORIDE 0.9% FLUSH: 10.0000 mL | INTRAVENOUS | Status: DC | PRN

## 2016-02-27 MED ORDER — PACLITAXEL PROTEIN-BOUND CHEMO INJECTION 100 MG
125.0000 mg/m2 | Freq: Once | INTRAVENOUS | Status: AC
  Administered 2016-02-27: 200 mg via INTRAVENOUS
  Filled 2016-02-27: qty 40

## 2016-02-27 MED ORDER — POTASSIUM CHLORIDE CRYS ER 20 MEQ PO TBCR: 40.0000 meq | EXTENDED_RELEASE_TABLET | Freq: Every day | ORAL | 0 refills | Status: DC

## 2016-02-27 MED ORDER — HEPARIN SOD (PORK) LOCK FLUSH 100 UNIT/ML IV SOLN
500.0000 [IU] | Freq: Once | INTRAVENOUS | Status: AC | PRN
  Administered 2016-02-27: 500 [IU]
  Filled 2016-02-27: qty 5

## 2016-02-27 NOTE — Assessment & Plan Note (Addendum)
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 Ardis Salazar on 11/29/2015 with FNA demonstrating adenocarcinoma.  CA 19-9 elevated at the initiation of treatment at 197.  Started on Gemcitabine/Abraxane beginning on 12/19/2015 in a day 1, 8, 15 every 28 day fashion.  Oncology history updated.  Pre-treatment labs today: CBC diff, CMET.  I personally reviewed and went over laboratory results with the patient.  The results are noted within this dictation.  Anemia is noted.  Hypokalemia is noted as well.  Will give Kdur 60 mEq today in the clinic, PO.  She will get 3 runs of 10 mEq K+ IV (total of 30 mEq).  Will give an Rx for Kdur 40 mEq daily.  Will recheck K+ next week with treatment.  Given her anemia and her inability to tolerate anemia, will plan on transfusing 2 units of PRBCs next week.  Her treatment course has been complicated by hygiene issues, hospitalizations, and ED visits.  She will get treated today for day #8 of cycle #3.  She will return next week for day #15 of cycle #3.    Order placed for MRI abdomen the first week in Feb 2018.    If imaging demonstrates improvement, will contact Dr. Barry Salazar for future surgical planning/recommendation(s).  Lindsay Salazar reports that Dr. Luna Salazar wants [place] to consumer her pain medication.  I have messaged Dr. Luna Salazar for confirmation/clarification.  [name] Controlled Substance Reporting System is reviewed.  She has been getting her pain medication for her right hip from Dr. Luna Salazar for a number of months.  Lindsay Salazar notes that she is involved with a pain contract with Dr. Luna Salazar.  I have received a message back from Dr. Luna Salazar and he is interested in having [place] consume her pain medication needs.  Return for follow-up 2nd week in Feb 2018.

## 2016-02-27 NOTE — Progress Notes (Signed)
CRITICAL VALUE ALERT Critical value received:  Potassium 2.7 Date of notification:  02/27/16 Time of notification: [id number] Critical value read back:  Yes.   Nurse who received alert:  T.Javonne Louissaint,RN MD notified (1st page):  1210 G.Dawson,NP

## 2016-02-27 NOTE — Patient Instructions (Addendum)
[place] at [facility] Discharge Instructions  RECOMMENDATIONS MADE BY THE CONSULTANT AND ANY TEST RESULTS WILL BE SENT TO YOUR REFERRING PHYSICIAN.  You were seen today by Kirby Crigler PA-C.  Treatment today. Day 15 next week. MRI 1st week of February. Take 40 mEq of Potassium daily. Return 2nd week of February for follow up.    Thank you for choosing [place] at [facility] to provide your oncology and hematology care.  To afford each patient quality time with our provider, please arrive at least 15 minutes before your scheduled appointment time.    If you have a lab appointment with the [facility] please come in thru the  Main Entrance and check in at the main information desk  You need to re-schedule your appointment should you arrive 10 or more minutes late.  We strive to give you quality time with our providers, and arriving late affects you and other patients whose appointments are after yours.  Also, if you no show three or more times for appointments you may be dismissed from the clinic at the providers discretion.     Again, thank you for choosing [facility].  Our hope is that these requests will decrease the amount of time that you wait before being seen by our physicians.       _____________________________________________________________  Should you have questions after your visit to [facility], please contact our office at [phone] between the hours of 8:30 a.m. and 4:30 p.m.  Voicemails left after 4:30 p.m. will not be returned until the following business day.  For prescription refill requests, have your pharmacy contact our office.       Resources For Cancer Patients and their Caregivers ? American Cancer Society: Can assist with transportation, wigs, general needs, runs Look Good Feel Better.        ([phone] 714-6093 ? Cancer Care: Provides financial assistance, online support  groups, medication/co-pay assistance.  1-800-813-HOPE 219-498-6436) ? [facility] Assists [place] cancer patients and their families through emotional , educational and financial support.  907-744-9746 ? [place] DSS Where to apply for food stamps, Medicaid and utility assistance. (720) 765-1656 ? [facility]: Transportation to medical appointments. 980-415-5124 ? Social Security Administration: May apply for disability if have a Stage IV cancer. 519-607-5320 (762)044-4819 ? [employer], Disability and Transit Services: Assists with nutrition, care and transit needs. [place] Support Programs: @10RELATIVEDAYS @ > Cancer Support Group  2nd Tuesday of the month 1pm-2pm, Journey Room  > Creative Journey  3rd Tuesday of the month 1130am-1pm, Journey Room  > Look Good Feel Better  1st Wednesday of the month 10am-12 noon, Journey Room (Call [facility] to register [phone])

## 2016-02-27 NOTE — Progress Notes (Signed)
[name], PA-C [address] [place] [address]  Malignant neoplasm of other parts of pancreas ([place]) - Plan: HYDROcodone-acetaminophen (NORCO/VICODIN) 5-325 MG per tablet 1 tablet, MR Abdomen W Wo Contrast, CANCELED: MR Abdomen W Contrast  Hypokalemia - Plan: potassium chloride SA (K-DUR,KLOR-CON) 20 MEQ tablet  CURRENT THERAPY: Gemcitabine/Abraxane day 1, 8, 15 every 28 days beginning on 12/19/2015 in the neoadjuvant setting.  INTERVAL HISTORY: Lindsay Salazar 66 y.o. 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 Ardis Hughs on 11/29/2015 with FNA demonstrating adenocarcinoma.  CA 19-9 elevated at the initiation of treatment at 197.  Started on Gemcitabine/Abraxane beginning on 12/19/2015 in a day 1, 8, 15 every 28 day fashion.    Pancreatic cancer ([place])   11/29/2015 Procedure    EUS by Dr. Ardis Hughs- 4.2cm mass in the body of pancreas causing main pancreatic duct obstruction (and dilation) and focally involving the portosplenic vein confluence. The mass does not involve the SMA, celiac trunk.      11/30/2015 Pathology Results    FINE NEEDLE ASPIRATION, ENDOSCOPIC, PANCREAS BODY (SPECIMEN 1 OF 1 COLLECTED 11/30/15): MALIGNANT CELLS CONSISTENT WITH ADENOCARCINOMA.      12/18/2015 Procedure    Successful placement of right IJ approach port-a-cath with tip at the superior caval atrial junction by IR      12/19/2015 -  Chemotherapy    The patient had PACLitaxel-protein bound (ABRAXANE) chemo infusion 200 mg, 125 mg/m2 = 200 mg, Intravenous,  Once, 1 of 4 cycles  gemcitabine (GEMZAR) 1,634 mg in sodium chloride 0.9 % 250 mL chemo infusion, 1,000 mg/m2 = 1,634 mg, Intravenous,  Once, 1 of 4 cycles  for chemotherapy treatment.        12/19/2015 Tumor Marker    Patient's tumor was tested for the following markers: CA 19-9. Results of  the tumor marker test revealed 197.      12/24/2015 Imaging    CT angio chest- 1. No evidence of pulmonary embolus. 2. Mild scattered bilateral peribronchovascular opacity within the lungs, more nodular at the right middle lobe, likely reflects an atypical infectious process.      01/20/2016 - 01/23/2016 Hospital Admission    Admit date: 01/20/2016 Admission diagnosis: COPD exacerbation Additional comments:       01/28/2016 - 02/07/2016 Hospital Admission    Admit date: 01/28/2016 Admission diagnosis: Atrial fibrillation with rapid ventricular response Additional comments: CHADS vasc score 3, likely secondary to hypoxia, On metoprolol and Cardizem, and on Xarelto for anticoagulation      02/17/2016 - 02/19/2016 Hospital Admission    Admit date: 02/17/2016 Admission diagnosis: .Bright red blood per rectum. Additional comments: Anoscopy performed.  Low volume hematochezia appears to be secondary to hemorrhoidal bleeding. Anoscopy in emergency room did not reveal evidence of gross bleeding. Nevertheless patient needs colonoscopy at some point as she has never undergone this exam in the past. Patient however is not agreeable to undergo colonoscopy during this admission but will consider at a later date.       She is doing well.  She denies any new issues today.  She notes ongoing right hip pain.  She has seen Dr. Luna Glasgow for this who has been managing her pain medication.  She reports that her current pain regimen is managing her pain well.  She admits to poor compliance with her K+ replacement therapy at  home.  She denies any oncology issues today.  Review of Systems  Constitutional: Negative.  Negative for chills, fever and weight loss.  HENT: Negative.   Eyes: Negative.   Respiratory: Negative.  Negative for cough.   Cardiovascular: Negative.  Negative for chest pain.  Gastrointestinal: Negative.  Negative for constipation, diarrhea, nausea and vomiting.  Genitourinary:  Negative.   Musculoskeletal: Negative.   Skin: Negative.   Neurological: Negative.  Negative for weakness.  Endo/Heme/Allergies: Negative.   Psychiatric/Behavioral: Negative.     Past Medical History:  Diagnosis Date  . Anginal pain ([place])    thought she was having   heartburn  . Arthritis    bursitis , fibromyalgia  . Arthritis    "right hip" (11/27/2015)  . Asthma   . Atrial fibrillation ([place])   . Chronic bronchitis ([place])   . Chronic right hip pain   . COPD (chronic obstructive pulmonary disease) ([place])    wears home o2 prn  . Daily headache    "last 2-3 months" (11/27/2015)  . Exposure to TB    "mom had it when she was pregnant with me"  . Fibromyalgia   . GERD (gastroesophageal reflux disease)    use to have it but not now  . Heart murmur    71-  42 years old  . Hypertension   . On home oxygen therapy    11/27/2015 "whenever I need it; don't know how many liters"  . Pancreatic cancer ([place])   . Pneumonia    "several times" (11/27/2015)  . Pneumothorax     Past Surgical History:  Procedure Laterality Date  . ANTERIOR CERVICAL DECOMP/DISCECTOMY FUSION     "put 4 screws in"  . BACK SURGERY    . CARDIAC CATHETERIZATION  08/2004   Archie Endo 06/18/2010  . CARDIOVERSION N/A 07/18/2015   Procedure: CARDIOVERSION;  Surgeon: Josue Hector, MD;  Location[facility];  Service: Cardiovascular;  Laterality: N/A;  . ESOPHAGOGASTRODUODENOSCOPY N/A 08/09/2015   Procedure: ESOPHAGOGASTRODUODENOSCOPY (EGD);  Surgeon: Rogene Houston, MD;  Location: [facility];  Service: Endoscopy;  Laterality: N/A;  . EUS N/A 11/29/2015   Procedure: UPPER ENDOSCOPIC ULTRASOUND (EUS) RADIAL;  Surgeon: Milus Banister, MD;  Location: [facility];  Service: Endoscopy;  Laterality: N/A;  . IR GENERIC HISTORICAL  12/18/2015   IR US GUIDE VASC ACCESS RIGHT 12/18/2015 Sandi Mariscal, MD [facility]  . IR GENERIC HISTORICAL  12/18/2015   IR FLUORO GUIDE PORT INSERTION RIGHT 12/18/2015 Sandi Mariscal, MD  [facility]  . TONSILLECTOMY    . TUBAL LIGATION      Family History  Problem Relation Age of Onset  . COPD Mother   . Diabetes Father   . Stroke Father     Social History   Social History  . Marital status: Divorced    Spouse name: N/A  . Number of children: N/A  . Years of education: N/A   Occupational History  . retired    Social History Main Topics  . Smoking status: Former Smoker    Packs/day: 0.50    Years: 46.00    Types: Cigarettes    Start date: 10/09/1969    Quit date: 11/29/2015  . Smokeless tobacco: Never Used  . Alcohol use No     Comment: quit 30 years ago  . Drug use: No  . Sexual activity: Not Currently    Birth control/ protection: Surgical, Post-menopausal   Other Topics Concern  . None   Social History  Narrative  . None     PHYSICAL EXAMINATION  ECOG PERFORMANCE STATUS: 1 - Symptomatic but completely ambulatory  There were no vitals filed for this visit.  Vitals - 1 value per visit [id number]  SYSTOLIC [id number]  DIASTOLIC 54  Pulse 60  Temperature 98.3  Respirations 20    GENERAL:alert, well nourished, well developed, comfortable, cooperative, smiling and alopecia, in chemo-bed, recently cleaned SKIN: skin color, texture, turgor are normal, no rashes or significant lesions HEAD: Normocephalic, No masses, lesions, tenderness or abnormalities EYES: normal, EOMI, Conjunctiva are pink and non-injected EARS: External ears normal OROPHARYNX:lips, buccal mucosa, and tongue normal and mucous membranes are moist, poor dentition. NECK: supple, trachea midline LYMPH:  no palpable lymphadenopathy BREAST:not examined LUNGS: clear to auscultation , decreased breath sounds HEART: regular rate & rhythm ABDOMEN:abdomen soft and normal bowel sounds BACK: Back symmetric, no curvature. EXTREMITIES:less then 2 second capillary refill, no joint deformities, effusion, or inflammation, no edema, no skin discoloration, no cyanosis  NEURO: alert & oriented x  3 with fluent speech, no focal motor/sensory deficits   LABORATORY DATA: CBC    Component Value Date/Time   WBC 6.7 02/27/2016 1105   RBC 3.11 (L) 02/27/2016 1105   HGB 8.9 (L) 02/27/2016 1105   HCT 29.1 (L) 02/27/2016 1105   PLT 189 02/27/2016 1105   MCV 93.6 02/27/2016 1105   MCH 28.6 02/27/2016 1105   MCHC 30.6 02/27/2016 1105   RDW 25.0 (H) 02/27/2016 1105   LYMPHSABS 1.6 02/27/2016 1105   MONOABS 0.8 02/27/2016 1105   EOSABS 0.1 02/27/2016 1105   BASOSABS 0.0 02/27/2016 1105      Chemistry      Component Value Date/Time   NA 137 02/27/2016 1105   K 2.7 (LL) 02/27/2016 1105   CL 105 02/27/2016 1105   CO2 23 02/27/2016 1105   BUN 23 (H) 02/27/2016 1105   CREATININE 0.76 02/27/2016 1105      Component Value Date/Time   CALCIUM 8.1 (L) 02/27/2016 1105   ALKPHOS 81 02/27/2016 1105   AST 18 02/27/2016 1105   ALT 31 02/27/2016 1105   BILITOT 0.4 02/27/2016 1105        PENDING LABS:   RADIOGRAPHIC STUDIES:  Dg Chest 2 View  Result Date: 02/23/2016 CLINICAL DATA:  Chest pain.  Cough. EXAM: CHEST  2 VIEW COMPARISON:  February 11, 2016 FINDINGS: Stable right Port-A-Cath. The heart, hila, mediastinum, lungs, and pleura are otherwise unchanged and unremarkable. IMPRESSION: No active cardiopulmonary disease. Electronically Signed   By: Dorise Bullion III M.D   On: 02/23/2016 13:56   Dg Chest 2 View  Result Date: 02/11/2016 CLINICAL DATA:  Chest pain, abdominal pain, shortness of breath EXAM: CHEST  2 VIEW COMPARISON:  02/07/2016 FINDINGS: There is a right-sided Port-A-Cath in satisfactory position. The lungs are hyperinflated likely secondary to COPD. There is mild biapical pleural thickening. There is no focal parenchymal opacity. There is no pleural effusion or pneumothorax. The heart and mediastinal contours are unremarkable. The osseous structures are unremarkable. IMPRESSION: No active cardiopulmonary disease. Electronically Signed   By: Kathreen Devoid   On: 02/11/2016  11:37   Dg Chest 2 View  Result Date: 02/07/2016 CLINICAL DATA:  Pancreatic carcinoma. Shortness of breath with exertion EXAM: CHEST  2 VIEW COMPARISON:  January 31, 2016 FINDINGS: Scarring in each lung apex region is stable. There is no edema or consolidation. Heart size and pulmonary vascularity are normal. No adenopathy. There is atherosclerotic calcification in the aorta. Port-A-Cath tip  is at the cavoatrial junction. No pneumothorax. There is postoperative change in the lower cervical spine. There are no blastic or lytic bone lesions. IMPRESSION: Stable scarring in each lung apex. No edema or consolidation. No evident adenopathy. There is aortic atherosclerosis. Electronically Signed   By: Lowella Grip III M.D.   On: 02/07/2016 13:46   Dg Chest Port 1 View  Result Date: 01/31/2016 CLINICAL DATA:  Cough, CHF EXAM: PORTABLE CHEST 1 VIEW COMPARISON:  01/28/2016 FINDINGS: Right Port-A-Cath remains in place, unchanged. Scarring in the apices bilaterally. No other confluent airspace opacities. Heart is normal size. No effusions or acute bony abnormality. IMPRESSION: Biapical scarring.  No active disease. Electronically Signed   By: Rolm Baptise M.D.   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 Ardis Hughs on 11/29/2015 with FNA demonstrating adenocarcinoma.  CA 19-9 elevated at the initiation of treatment at 197.  Started on Gemcitabine/Abraxane beginning on 12/19/2015 in a day 1, 8, 15 every 28 day fashion.  Oncology history updated.  Pre-treatment labs today: CBC diff, CMET.  I personally reviewed and went over laboratory results with the patient.  The results are noted within this dictation.  Anemia is noted.  Hypokalemia is noted as well.  Will give Kdur 60 mEq today in the clinic, PO.  She will get  3 runs of 10 mEq K+ IV (total of 30 mEq).  Will give an Rx for Kdur 40 mEq daily.  Will recheck K+ next week with treatment.  Given her anemia and her inability to tolerate anemia, will plan on transfusing 2 units of PRBCs next week.  Her treatment course has been complicated by hygiene issues, hospitalizations, and ED visits.  She will get treated today for day #8 of cycle #3.  She will return next week for day #15 of cycle #3.    Order placed for MRI abdomen the first week in Feb 2018.    If imaging demonstrates improvement, will contact Dr. Barry Dienes for future surgical planning/recommendation(s).  Siddie reports that Dr. Luna Glasgow wants [place] to consumer her pain medication.  I have messaged Dr. Luna Glasgow for confirmation/clarification.  [place] Controlled Substance Reporting System is reviewed.  She has been getting her pain medication for her right hip from Dr. Luna Glasgow for a number of months.  [place]kema notes that she is involved with a pain contract with Dr. Luna Glasgow.  I have received a message back from Dr. Luna Glasgow and he is interested in having [place] consume her pain medication needs.  Return for follow-up 2nd week in Feb 2018.   ORDERS PLACED FOR THIS ENCOUNTER: Orders Placed This Encounter  Procedures  . MR Abdomen W Wo Contrast    MEDICATIONS PRESCRIBED THIS ENCOUNTER: Meds ordered this encounter  Medications  . potassium chloride SA (K-DUR,KLOR-CON) 20 MEQ tablet    Sig: Take 2 tablets (40 mEq total) by mouth daily.    Dispense:  60 tablet    Refill:  0    Order Specific Question:   Supervising Provider    Answer:   Patrici Ranks [id number]    THERAPY PLAN:  Will plan on completing cycle #3, followed by restaging.  If MRI demonstrates improvement, will discuss case with Dr. Barry Dienes for consideration of resection.  All questions were answered. The patient knows to call the clinic  with any problems, questions or concerns. We can certainly see the patient much sooner if  necessary.  Patient and plan discussed with Dr. Ancil Linsey and she is in agreement with the aforementioned.   This note is electronically signed by: Doy Mince 02/27/2016 5:26 PM

## 2016-02-28 NOTE — Progress Notes (Signed)
Tolerated tx w/o adverse reaction.  Alert, in no distress.  VSS.  Discharged via wheelchair in c/o family.  

## 2016-02-28 NOTE — Patient Instructions (Signed)
[facility] Discharge Instructions for Patients Receiving Chemotherapy   Beginning January 23rd 2017 lab work for the [facility] will be done in the  Main lab at [facility] on 1st floor. If you have a lab appointment with the [facility] please come in thru the  Main Entrance and check in at the main information desk   Today you received the following chemotherapy agents:  Abraxane and Gemzar.  TAKE YOUR POTASSIUM PILLS AS PRESCRIBED!   If you develop nausea and vomiting, or diarrhea that is not controlled by your medication, call the clinic.  The clinic phone number is [phone]. Office hours are Monday-Friday 8:30am-5:00pm.  BELOW ARE SYMPTOMS THAT SHOULD BE REPORTED IMMEDIATELY:  *FEVER GREATER THAN 101.0 F  *CHILLS WITH OR WITHOUT FEVER  NAUSEA AND VOMITING THAT IS NOT CONTROLLED WITH YOUR NAUSEA MEDICATION  *UNUSUAL SHORTNESS OF BREATH  *UNUSUAL BRUISING OR BLEEDING  TENDERNESS IN MOUTH AND THROAT WITH OR WITHOUT PRESENCE OF ULCERS  *URINARY PROBLEMS  *BOWEL PROBLEMS  UNUSUAL RASH Items with * indicate a potential emergency and should be followed up as soon as possible. If you have an emergency after office hours please contact your primary care physician or go to the nearest emergency department.  Please call the clinic during office hours if you have any questions or concerns.   You may also contact the Patient Navigator at [phone] should you have any questions or need assistance in obtaining follow up care.      Resources For Cancer Patients and their Caregivers ? American Cancer Society: Can assist with transportation, wigs, general needs, runs Look Good Feel Better.        ([phone] ? Cancer Care: Provides financial assistance, online support groups, medication/co-pay assistance.  1-800-813-HOPE 8570800713) ? [place] Assists [place] cancer patients and their families through emotional ,  educational and financial support.  4806786032 ? [place] DSS Where to apply for food stamps, Medicaid and utility assistance. 530-675-1560 ? [facility]: Transportation to medical appointments. (918)037-2243 ? Social Security Administration: May apply for disability if have a Stage IV cancer. 209-054-3807 762-601-7024 ? [employer], Disability and Transit Services: Assists with nutrition, care and transit needs. ([phone] [phone]

## 2016-02-29 ENCOUNTER — Emergency Department (HOSPITAL_COMMUNITY): Payer: Medicare HMO

## 2016-02-29 ENCOUNTER — Encounter (HOSPITAL_COMMUNITY): Payer: Self-pay

## 2016-02-29 ENCOUNTER — Other Ambulatory Visit: Payer: Self-pay

## 2016-02-29 ENCOUNTER — Inpatient Hospital Stay (HOSPITAL_COMMUNITY)
Admission: EM | Admit: 2016-02-29 | Discharge: 2016-03-02 | DRG: 191 | Disposition: A | Discharge status: Home / Self Care | Payer: Medicare HMO | Attending: Internal Medicine | Admitting: Internal Medicine

## 2016-02-29 DIAGNOSIS — Z91013 Allergy to seafood: Secondary | ICD-10-CM

## 2016-02-29 DIAGNOSIS — Z79899 Other long term (current) drug therapy: Secondary | ICD-10-CM

## 2016-02-29 DIAGNOSIS — Z885 Allergy status to narcotic agent status: Secondary | ICD-10-CM

## 2016-02-29 DIAGNOSIS — F17210 Nicotine dependence, cigarettes, uncomplicated: Secondary | ICD-10-CM | POA: Diagnosis present

## 2016-02-29 DIAGNOSIS — E440 Moderate protein-calorie malnutrition: Secondary | ICD-10-CM | POA: Diagnosis present

## 2016-02-29 DIAGNOSIS — Z823 Family history of stroke: Secondary | ICD-10-CM

## 2016-02-29 DIAGNOSIS — I482 Chronic atrial fibrillation: Secondary | ICD-10-CM | POA: Diagnosis present

## 2016-02-29 DIAGNOSIS — J9611 Chronic respiratory failure with hypoxia: Secondary | ICD-10-CM | POA: Diagnosis present

## 2016-02-29 DIAGNOSIS — C259 Malignant neoplasm of pancreas, unspecified: Secondary | ICD-10-CM | POA: Diagnosis present

## 2016-02-29 DIAGNOSIS — Z7901 Long term (current) use of anticoagulants: Secondary | ICD-10-CM

## 2016-02-29 DIAGNOSIS — Z79891 Long term (current) use of opiate analgesic: Secondary | ICD-10-CM

## 2016-02-29 DIAGNOSIS — I4891 Unspecified atrial fibrillation: Secondary | ICD-10-CM | POA: Diagnosis present

## 2016-02-29 DIAGNOSIS — I10 Essential (primary) hypertension: Secondary | ICD-10-CM | POA: Diagnosis present

## 2016-02-29 DIAGNOSIS — J441 Chronic obstructive pulmonary disease with (acute) exacerbation: Secondary | ICD-10-CM | POA: Diagnosis not present

## 2016-02-29 DIAGNOSIS — F418 Other specified anxiety disorders: Secondary | ICD-10-CM | POA: Diagnosis present

## 2016-02-29 DIAGNOSIS — Z981 Arthrodesis status: Secondary | ICD-10-CM

## 2016-02-29 DIAGNOSIS — C257 Malignant neoplasm of other parts of pancreas: Secondary | ICD-10-CM | POA: Diagnosis not present

## 2016-02-29 DIAGNOSIS — M797 Fibromyalgia: Secondary | ICD-10-CM | POA: Diagnosis present

## 2016-02-29 DIAGNOSIS — Z825 Family history of asthma and other chronic lower respiratory diseases: Secondary | ICD-10-CM

## 2016-02-29 DIAGNOSIS — D638 Anemia in other chronic diseases classified elsewhere: Secondary | ICD-10-CM | POA: Diagnosis present

## 2016-02-29 DIAGNOSIS — Z886 Allergy status to analgesic agent status: Secondary | ICD-10-CM

## 2016-02-29 DIAGNOSIS — R0602 Shortness of breath: Secondary | ICD-10-CM | POA: Diagnosis not present

## 2016-02-29 DIAGNOSIS — Z8701 Personal history of pneumonia (recurrent): Secondary | ICD-10-CM

## 2016-02-29 DIAGNOSIS — M1611 Unilateral primary osteoarthritis, right hip: Secondary | ICD-10-CM | POA: Diagnosis present

## 2016-02-29 DIAGNOSIS — Z6822 Body mass index (BMI) 22.0-22.9, adult: Secondary | ICD-10-CM

## 2016-02-29 DIAGNOSIS — K219 Gastro-esophageal reflux disease without esophagitis: Secondary | ICD-10-CM | POA: Diagnosis present

## 2016-02-29 DIAGNOSIS — Z833 Family history of diabetes mellitus: Secondary | ICD-10-CM

## 2016-02-29 DIAGNOSIS — R000 Tachycardia, unspecified: Secondary | ICD-10-CM | POA: Diagnosis present

## 2016-02-29 DIAGNOSIS — Z888 Allergy status to other drugs, medicaments and biological substances status: Secondary | ICD-10-CM

## 2016-02-29 DIAGNOSIS — Z880 Allergy status to penicillin: Secondary | ICD-10-CM

## 2016-02-29 DIAGNOSIS — Z201 Contact with and (suspected) exposure to tuberculosis: Secondary | ICD-10-CM | POA: Diagnosis present

## 2016-02-29 DIAGNOSIS — D6481 Anemia due to antineoplastic chemotherapy: Secondary | ICD-10-CM | POA: Diagnosis not present

## 2016-02-29 DIAGNOSIS — Z9981 Dependence on supplemental oxygen: Secondary | ICD-10-CM

## 2016-02-29 LAB — COMPREHENSIVE METABOLIC PANEL
ALT: 41 U/L (ref 14–54)
ANION GAP: 6 (ref 5–15)
AST: 21 U/L (ref 15–41)
Albumin: 3.1 g/dL — ABNORMAL LOW (ref 3.5–5.0)
Alkaline Phosphatase: 78 U/L (ref 38–126)
BILIRUBIN TOTAL: 0.8 mg/dL (ref 0.3–1.2)
BUN: 15 mg/dL (ref 6–20)
CO2: 25 mmol/L (ref 22–32)
Calcium: 8.6 mg/dL — ABNORMAL LOW (ref 8.9–10.3)
Chloride: 108 mmol/L (ref 101–111)
Creatinine, Ser: 0.67 mg/dL (ref 0.44–1.00)
Glucose, Bld: 103 mg/dL — ABNORMAL HIGH (ref 65–99)
POTASSIUM: 4.8 mmol/L (ref 3.5–5.1)
Sodium: 139 mmol/L (ref 135–145)
Total Protein: 5.9 g/dL — ABNORMAL LOW (ref 6.5–8.1)

## 2016-02-29 LAB — GLUCOSE, CAPILLARY
GLUCOSE-CAPILLARY: 176 mg/dL — AB (ref 65–99)
Glucose-Capillary: 126 mg/dL — ABNORMAL HIGH (ref 65–99)

## 2016-02-29 LAB — CBC WITH DIFFERENTIAL/PLATELET
BASOS ABS: 0 10*3/uL (ref 0.0–0.1)
Basophils Relative: 0 %
EOS ABS: 0 10*3/uL (ref 0.0–0.7)
Eosinophils Relative: 0 %
HEMATOCRIT: 26.5 % — AB (ref 36.0–46.0)
HEMOGLOBIN: 8 g/dL — AB (ref 12.0–15.0)
LYMPHS PCT: 11 %
Lymphs Abs: 0.6 10*3/uL — ABNORMAL LOW (ref 0.7–4.0)
MCH: 28.9 pg (ref 26.0–34.0)
MCHC: 30.2 g/dL (ref 30.0–36.0)
MCV: 95.7 fL (ref 78.0–100.0)
MONO ABS: 0.1 10*3/uL (ref 0.1–1.0)
Monocytes Relative: 1 %
NEUTROS PCT: 88 %
Neutro Abs: 4.7 10*3/uL (ref 1.7–7.7)
Platelets: 185 10*3/uL (ref 150–400)
RBC: 2.77 MIL/uL — ABNORMAL LOW (ref 3.87–5.11)
RDW: 24.7 % — AB (ref 11.5–15.5)
WBC: 5.4 10*3/uL (ref 4.0–10.5)

## 2016-02-29 LAB — BRAIN NATRIURETIC PEPTIDE: B NATRIURETIC PEPTIDE 5: 223 pg/mL — AB (ref 0.0–100.0)

## 2016-02-29 LAB — PROCALCITONIN: PROCALCITONIN: 0.14 ng/mL

## 2016-02-29 LAB — I-STAT TROPONIN, ED: TROPONIN I, POC: 0.01 ng/mL (ref 0.00–0.08)

## 2016-02-29 LAB — LIPASE, BLOOD: LIPASE: 12 U/L (ref 11–51)

## 2016-02-29 LAB — INFLUENZA PANEL BY PCR (TYPE A & B)
Influenza A By PCR: NEGATIVE
Influenza B By PCR: NEGATIVE

## 2016-02-29 MED ORDER — HYDROCODONE-ACETAMINOPHEN 5-325 MG PO TABS
1.0000 | ORAL_TABLET | Freq: Once | ORAL | Status: AC
  Administered 2016-02-29: 1 via ORAL
  Filled 2016-02-29: qty 1

## 2016-02-29 MED ORDER — PANTOPRAZOLE SODIUM 40 MG PO TBEC
40.0000 mg | DELAYED_RELEASE_TABLET | Freq: Every day | ORAL | Status: DC
  Administered 2016-03-01 – 2016-03-02 (×2): 40 mg via ORAL
  Filled 2016-02-29 (×2): qty 1

## 2016-02-29 MED ORDER — ENSURE ENLIVE PO LIQD
237.0000 mL | Freq: Two times a day (BID) | ORAL | Status: DC
  Administered 2016-03-01 – 2016-03-02 (×3): 237 mL via ORAL

## 2016-02-29 MED ORDER — ESCITALOPRAM OXALATE 10 MG PO TABS
10.0000 mg | ORAL_TABLET | Freq: Every evening | ORAL | Status: DC
  Administered 2016-02-29 – 2016-03-01 (×2): 10 mg via ORAL
  Filled 2016-02-29 (×2): qty 1

## 2016-02-29 MED ORDER — HYDROCODONE-ACETAMINOPHEN 5-325 MG PO TABS
1.0000 | ORAL_TABLET | ORAL | Status: DC | PRN
  Administered 2016-02-29 – 2016-03-02 (×10): 1 via ORAL
  Filled 2016-02-29 (×11): qty 1

## 2016-02-29 MED ORDER — MOMETASONE FURO-FORMOTEROL FUM 200-5 MCG/ACT IN AERO
INHALATION_SPRAY | RESPIRATORY_TRACT | Status: AC
  Filled 2016-02-29: qty 8.8

## 2016-02-29 MED ORDER — LEVOFLOXACIN IN D5W 750 MG/150ML IV SOLN
750.0000 mg | INTRAVENOUS | Status: DC
  Administered 2016-02-29 – 2016-03-01 (×2): 750 mg via INTRAVENOUS
  Filled 2016-02-29 (×2): qty 150

## 2016-02-29 MED ORDER — IPRATROPIUM-ALBUTEROL 0.5-2.5 (3) MG/3ML IN SOLN
3.0000 mL | Freq: Once | RESPIRATORY_TRACT | Status: AC
  Administered 2016-02-29: 3 mL via RESPIRATORY_TRACT
  Filled 2016-02-29: qty 3

## 2016-02-29 MED ORDER — POLYETHYLENE GLYCOL 3350 17 G PO PACK: 17.0000 g | PACK | Freq: Every day | ORAL | Status: DC | PRN

## 2016-02-29 MED ORDER — RIVAROXABAN 20 MG PO TABS
20.0000 mg | ORAL_TABLET | Freq: Every day | ORAL | Status: DC
  Filled 2016-02-29: qty 1

## 2016-02-29 MED ORDER — IPRATROPIUM-ALBUTEROL 0.5-2.5 (3) MG/3ML IN SOLN: 3.0000 mL | Freq: Two times a day (BID) | RESPIRATORY_TRACT | Status: DC

## 2016-02-29 MED ORDER — METOPROLOL TARTRATE 25 MG PO TABS
25.0000 mg | ORAL_TABLET | Freq: Two times a day (BID) | ORAL | Status: DC
  Administered 2016-02-29 – 2016-03-02 (×4): 25 mg via ORAL
  Filled 2016-02-29 (×4): qty 1

## 2016-02-29 MED ORDER — IPRATROPIUM-ALBUTEROL 20-100 MCG/ACT IN AERS: 1.0000 | INHALATION_SPRAY | Freq: Two times a day (BID) | RESPIRATORY_TRACT | Status: DC

## 2016-02-29 MED ORDER — DILTIAZEM HCL ER COATED BEADS 180 MG PO CP24
360.0000 mg | ORAL_CAPSULE | Freq: Every day | ORAL | Status: DC
  Administered 2016-03-01 – 2016-03-02 (×2): 360 mg via ORAL
  Filled 2016-02-29 (×2): qty 2
  Filled 2016-02-29 (×3): qty 1

## 2016-02-29 MED ORDER — ACETAMINOPHEN 325 MG PO TABS: 650.0000 mg | ORAL_TABLET | Freq: Four times a day (QID) | ORAL | Status: DC | PRN

## 2016-02-29 MED ORDER — NICOTINE 14 MG/24HR TD PT24
14.0000 mg | MEDICATED_PATCH | Freq: Every day | TRANSDERMAL | Status: DC
  Administered 2016-02-29 – 2016-03-02 (×3): 14 mg via TRANSDERMAL
  Filled 2016-02-29 (×3): qty 1

## 2016-02-29 MED ORDER — HYDROCODONE-ACETAMINOPHEN 5-325 MG PO TABS
1.0000 | ORAL_TABLET | Freq: Once | ORAL | Status: AC
Start: 2016-02-29 — End: 2016-02-29
  Administered 2016-02-29: 1 via ORAL

## 2016-02-29 MED ORDER — LEVALBUTEROL HCL 1.25 MG/0.5ML IN NEBU
1.2500 mg | INHALATION_SOLUTION | RESPIRATORY_TRACT | Status: DC
  Administered 2016-02-29 – 2016-03-02 (×11): 1.25 mg via RESPIRATORY_TRACT
  Filled 2016-02-29 (×11): qty 0.5

## 2016-02-29 MED ORDER — IPRATROPIUM-ALBUTEROL 0.5-2.5 (3) MG/3ML IN SOLN
3.0000 mL | Freq: Once | RESPIRATORY_TRACT | Status: AC
  Administered 2016-02-29: 3 mL via RESPIRATORY_TRACT

## 2016-02-29 MED ORDER — MOMETASONE FURO-FORMOTEROL FUM 200-5 MCG/ACT IN AERO
2.0000 | INHALATION_SPRAY | Freq: Two times a day (BID) | RESPIRATORY_TRACT | Status: DC
  Administered 2016-02-29 – 2016-03-02 (×4): 2 via RESPIRATORY_TRACT
  Filled 2016-02-29: qty 8.8

## 2016-02-29 MED ORDER — DOCUSATE SODIUM 100 MG PO CAPS
100.0000 mg | ORAL_CAPSULE | Freq: Two times a day (BID) | ORAL | Status: DC
  Administered 2016-02-29 – 2016-03-02 (×4): 100 mg via ORAL
  Filled 2016-02-29 (×4): qty 1

## 2016-02-29 MED ORDER — ACETAMINOPHEN 650 MG RE SUPP: 650.0000 mg | Freq: Four times a day (QID) | RECTAL | Status: DC | PRN

## 2016-02-29 MED ORDER — PREDNISONE 50 MG PO TABS
60.0000 mg | ORAL_TABLET | Freq: Once | ORAL | Status: AC
  Administered 2016-02-29: 16:00:00 60 mg via ORAL
  Filled 2016-02-29: qty 1

## 2016-02-29 MED ORDER — PROCHLORPERAZINE MALEATE 5 MG PO TABS: 10.0000 mg | ORAL_TABLET | Freq: Every day | ORAL | Status: DC | PRN

## 2016-02-29 NOTE — ED Notes (Signed)
Dr Long at bedside

## 2016-02-29 NOTE — ED Triage Notes (Signed)
Pt c/o sob that started last night.  EMS administered 5mg  albuterol and placed saline lock left arm.  Pt used combivent inhaler prior to ems's arrival.  Pt c/o r hip pain.  Reports chest pain earlier but has subsided.  C/O sore throat and cough.  Denies fever.

## 2016-02-29 NOTE — ED Provider Notes (Signed)
Emergency Department Provider Note   I have reviewed the triage vital signs and the nursing notes.  By signing my name below, I, Judithe Modest, attest that this documentation has been prepared under the direction and in the presence of Nanda Quinton, MD. Electronically Signed: Judithe Modest, ER Scribe. 09/15/2015. 1:23 PM.  HISTORY  Chief Complaint Shortness of Breath  HPI Lindsay Salazar is a 66 y.o. female complaining of an acute exacerbation of chronic SOB. She endorses associated HA, rhinorrhea, swelling in legs, rapid heart rate this morning and productive cough. Last night she slept wearing her oxygen, she woke up in the middle of the night to use the bathroom and it took her 30 mins to return to bed due to severe SOB. She has a PMHx of COPD, Asthma, afib. She is an infrequent smoker but smoked a cigarette yesterday. She was a heavy smoker for many years. She uses a nebulizer regularly. She is allergic to tiotropium bromide. Symptoms made worse with exertion and not relieved with home albuterol. Denies CP. Does note some chronic right sided abdominal pain that is no worse than usual.    Past Medical History:  Diagnosis Date  . Anginal pain ([place])    thought she was having   heartburn  . Arthritis    bursitis , fibromyalgia  . Arthritis    "right hip" (11/27/2015)  . Asthma   . Atrial fibrillation ([place])   . Chronic bronchitis ([place])   . Chronic right hip pain   . COPD (chronic obstructive pulmonary disease) ([place])    wears home o2 prn  . Daily headache    "last 2-3 months" (11/27/2015)  . Exposure to TB    "mom had it when she was pregnant with me"  . Fibromyalgia   . GERD (gastroesophageal reflux disease)    use to have it but not now  . Heart murmur    70-  41 years old  . Hypertension   . On home oxygen therapy    11/27/2015 "whenever I need it; don't know how many liters"  . Pancreatic cancer ([place])   . Pneumonia    "several times" (11/27/2015)  . Pneumothorax      Patient Active Problem List   Diagnosis Date Noted  . COPD exacerbation ([place]) 02/29/2016  . Rectal bleed 02/17/2016  . AKI (acute kidney injury) ([place]) 02/17/2016  . Chronic respiratory failure with hypoxia ([place]) 01/29/2016  . Malnutrition of moderate degree 01/29/2016  . Atrial fibrillation with rapid ventricular response ([place]) 01/28/2016  . Hypertension 01/28/2016  . Anxiety 01/28/2016  . Iron deficiency anemia 01/24/2016  . B12 deficiency 01/24/2016  . COPD with acute exacerbation ([place]) 01/20/2016  . Pancreatic cancer ([place]) 12/07/2015  . Epigastric pain   . Abdominal pain 11/27/2015  . Hypokalemia 11/27/2015  . Leukocytosis 11/27/2015  . Chronic anticoagulation-Xarelto 09/06/2015  . Gastrointestinal hemorrhage with melena 08/08/2015  . Acute blood loss anemia 08/08/2015  . Chronic obstructive pulmonary disease (COPD) ([place]) 08/08/2015  . Chest pain 08/08/2015  . Hyperglycemia 07/17/2015  . Anemia 07/17/2015  . Atrial fibrillation ([place]) 07/17/2015  . Hypertensive urgency 06/27/2015  . Tobacco abuse 06/27/2015  . Elevated troponin 06/27/2015  . CAP (community acquired pneumonia)   . OSTEOPOROSIS 04/10/2009  . WEIGHT LOSS 04/10/2009    Past Surgical History:  Procedure Laterality Date  . ANTERIOR CERVICAL DECOMP/DISCECTOMY FUSION     "put 4 screws in"  . BACK SURGERY    . CARDIAC CATHETERIZATION  08/2004   Archie Endo 06/18/2010  . CARDIOVERSION N/A 07/18/2015   Procedure: CARDIOVERSION;  Surgeon: Josue Hector, MD;  [facility];  Service: Cardiovascular;  Laterality: N/A;  . ESOPHAGOGASTRODUODENOSCOPY N/A 08/09/2015   Procedure: ESOPHAGOGASTRODUODENOSCOPY (EGD);  Surgeon: Rogene Houston, MD;  Location: [facility];  Service: Endoscopy;  Laterality: N/A;  . EUS N/A 11/29/2015   Procedure: UPPER ENDOSCOPIC ULTRASOUND (EUS) RADIAL;  Surgeon: Milus Banister, MD;  Location: [facility];  Service: Endoscopy;  Laterality: N/A;  . IR GENERIC HISTORICAL  12/18/2015    IR US GUIDE VASC ACCESS RIGHT 12/18/2015 Sandi Mariscal, MD [facility]  . IR GENERIC HISTORICAL  12/18/2015   IR FLUORO GUIDE PORT INSERTION RIGHT 12/18/2015 Sandi Mariscal, MD [facility]  . TONSILLECTOMY    . TUBAL LIGATION      Current Outpatient Rx  . Order #: [id number] Class: Print  . Order #: [id number] Class: Normal  . Order #: [id number] Class: Normal  . Order #: [id number] Class: Print  . Order #: [id number] Class: Historical Med  . Order #: [id number] Class: Print  . Order #: [id number] Class: Normal  . Order #: [id number] Class: Normal  . Order #: [id number] Class: Normal  . Order #: [id number] Class: Print  . Order #: [id number] Class: Historical Med  . Order #: [id number] Class: Historical Med  . Order #: [id number] Class: Print  . Order #: [id number] Class: Print  . Order #: [id number] Class: Normal  . Order #: [id number] Class: Historical Med  . Order #: [id number] Class: Normal  . Order #: [id number] Class: Print    Allergies Ibuprofen; Other; Penicillins; Tiotropium bromide monohydrate; and Tramadol  Family History  Problem Relation Age of Onset  . COPD Mother   . Diabetes Father   . Stroke Father     Social History Social History  Substance Use Topics  . Smoking status: Former Smoker    Packs/day: 0.50    Years: 46.00    Types: Cigarettes    Start date: 10/09/1969    Quit date: 11/29/2015  . Smokeless tobacco: Never Used  . Alcohol use No     Comment: quit 30 years ago    Review of Systems Constitutional: No fever/chills Eyes: No visual changes. ENT: No sore throat. Cardiovascular: Denies chest pain. Respiratory: She endorses severe shortness of breath. Gastrointestinal: Endorses mild diffuse right-sided abdominal pain, unchanged from baseline.  No nausea, no vomiting.  No diarrhea.  No constipation. Genitourinary: Negative for dysuria. Musculoskeletal: Negative for back pain. Skin: Negative for rash. Neurological: Positive for headaches. Negative for focal weakness or  numbness. 10-point ROS otherwise negative.  ____________________________________________   PHYSICAL EXAM:  VITAL SIGNS: Temp: 98.4 F RR: 19 SpO2: 98% 2L O@ Pulse 96  Constitutional: Alert and oriented. Mild dyspnea noted.  Eyes: Conjunctivae are normal.  Head: Atraumatic. Nose: No congestion/rhinnorhea. Mouth/Throat: Mucous membranes are dry. Neck: No stridor.   Cardiovascular: Flutter waves on monitor. Good peripheral circulation. Grossly normal heart sounds.   Respiratory: Normal respiratory effort.  No retractions. Lungs with diffusely diminished air entry and expiratory wheezing.  Gastrointestinal: Soft and nontender. No distention.  Musculoskeletal: No lower extremity tenderness nor edema. No gross deformities of extremities. Neurologic:  Normal speech and language. No gross focal neurologic deficits are appreciated.  Skin:  Skin is warm, dry and intact. No rash noted. Psychiatric: Mood and affect are normal. Speech and behavior are normal.  ____________________________________________   LABS (all labs ordered are listed, but only abnormal results are displayed)  Labs Reviewed  COMPREHENSIVE METABOLIC PANEL - Abnormal; Notable for the following:  Result Value   Glucose, Bld 103 (*)    Calcium 8.6 (*)    Total Protein 5.9 (*)    Albumin 3.1 (*)    All other components within normal limits  CBC WITH DIFFERENTIAL/PLATELET - Abnormal; Notable for the following:    RBC 2.77 (*)    Hemoglobin 8.0 (*)    HCT 26.5 (*)    RDW 24.7 (*)    Lymphs Abs 0.6 (*)    All other components within normal limits  BRAIN NATRIURETIC PEPTIDE - Abnormal; Notable for the following:    B Natriuretic Peptide 223.0 (*)    All other components within normal limits  LIPASE, BLOOD  INFLUENZA PANEL BY PCR (TYPE A & B)  I-STAT TROPOININ, ED   ____________________________________________  EKG   EKG Interpretation  Date/Time:  Friday February 29 2016 13:15:46 EST Ventricular Rate:    96 PR Interval:    QRS Duration: 81 QT Interval:  322 QTC Calculation: 424 R Axis:   76 Text Interpretation:  Atrial fibrillation/flutter Abnormal T, consider ischemia, lateral leads Baseline wander in lead(s) V1 No STEMI. Similar to prior tracing.  Confirmed by Annamaria Salah MD, Yoskar Murrillo [phone]) on 02/29/2016 1:33:51 PM       ____________________________________________  RADIOLOGY  Dg Chest 2 View  Result Date: 02/29/2016 CLINICAL DATA:  Shortness of Breath EXAM: CHEST  2 VIEW COMPARISON:  02/23/2016 FINDINGS: Right Port-A-Cath remains in place, unchanged. There is hyperinflation of the lungs compatible with COPD. Biapical scarring. Heart is upper limits normal in size. No effusions or confluent airspace opacities. IMPRESSION: COPD/chronic changes.  Borderline heart size.  No active disease. Electronically Signed   By: Rolm Baptise M.D.   On: 02/29/2016 14:18    ____________________________________________   PROCEDURES  Procedure(s) performed:   Procedures  None ____________________________________________   INITIAL IMPRESSION / ASSESSMENT AND PLAN / ED COURSE  Pertinent labs & imaging results that were available during my care of the patient were reviewed by me and considered in my medical decision making (see chart for details).  Patient resents to the emergency department for evaluation of acute on chronic difficulty breathing, palpitations, URI symptoms. Patient with atrial flutter on EKG with no RVR. Lungs are significant for diffuse diminished air entry and end expiratory wheezing. Suspect COPD exacerbation with likely underlying viral illness. We'll obtain chest x-ray to rule out pneumonia. Plan for DuoNeb and baseline labs. Patient feels that her bilateral lower extremities are somewhat swollen but this is not appreciated on exam.   03:14 PM Flu negative. Chest x-ray with no infiltrate. Patient becomes significant dyspneic with any ambulation. She is comfortable and breathing  well when at rest on 2 L [place]. Plan for admission for COPD exacerbation. Gave steroid and additional DuoNeb along with hydrocodone which the patient takes at home for her chronic right hip pain.   03:27 PM Discussed patient's case with hospitalist, Dr. Reesa Chew.  Recommend admission to med-surg, obs bed.  I will place holding orders per their request. Patient and family (if present) updated with plan. Care transferred to hospitlaist service.  I reviewed all nursing notes, vitals, pertinent old records, EKGs, labs, imaging (as available).  ____________________________________________  FINAL CLINICAL IMPRESSION(S) / ED DIAGNOSES  Final diagnoses:  COPD exacerbation ([place])     MEDICATIONS GIVEN DURING THIS VISIT:  Medications  predniSONE (DELTASONE) tablet 60 mg (not administered)  ipratropium-albuterol (DUONEB) 0.5-2.5 (3) MG/3ML nebulizer solution 3 mL (not administered)  HYDROcodone-acetaminophen (NORCO/VICODIN) 5-325 MG per tablet 1 tablet (not administered)  ipratropium-albuterol (DUONEB) 0.5-2.5 (3) MG/3ML nebulizer solution 3 mL (3 mLs Nebulization Given 02/29/16 1445)     NEW OUTPATIENT MEDICATIONS STARTED DURING THIS VISIT:  None   Note:  This document was prepared using Dragon voice recognition software and may include unintentional dictation errors.  Nanda Quinton, MD Emergency Medicine  I personally performed the services described in this documentation, which was scribed in my presence. The recorded information has been reviewed and is accurate.          Margette Fast, MD 03/01/16 [phone]

## 2016-02-29 NOTE — H&P (Addendum)
History and Physical    Lindsay Salazar [id number] DOB: 1950/06/25 DOA: 02/29/2016  PCP: [name], PA-C  Patient coming from: Home  Chief Complaint: sob  HPI: Lindsay Salazar is a 66 y.o. female with medical history significant of for COPD on Home O2, A fib on Xarelto, Pancreatic adenocarcinoma on chemo, GERD, fibromyalgia, anxiety comes to the hospital with complaints of sob. Patient states she has been sob for the past 3 days and it has progressively worsened to a point where cant even walk across the room. During this time she reports of "feeling cold" at home and maybe subjective fevers. She also brings up whitish/brown mucus but no chest pain, abd pain, LE swelling and other complaints. She used to be a heavy smoker up until last year in May, and now only smokes maybe 3-5 cig per day. She does live in a household where she gets lots of second hand smoking.  She was recently here for rectal bleed but her Hb remained stable so she had returned back home on Xarelto. She is still on taper dose of steroids at home but she doesn't think it has been helping.  She admits of not using her O2 throughout the day.   Today in the ED she was noted to be sob and unable to walk across the room. Her CXR showed COPD changes. CBC showed chronic anemia, at her baseline otherwise unremarkable. On Pe: she did have moderate to severe exp wheezing. She was started on steroids and duonebs with little improvement.     Review of Systems: As per HPI otherwise 10 point review of systems negative.    Past Medical History:  Diagnosis Date  . Anginal pain ([place])    thought she was having   heartburn  . Arthritis    bursitis , fibromyalgia  . Arthritis    "right hip" (11/27/2015)  . Asthma   . Atrial fibrillation ([place])   . Chronic bronchitis ([place])   . Chronic right hip pain   . COPD (chronic obstructive pulmonary disease) ([place])    wears home o2 prn  . Daily headache    "last 2-3 months" (11/27/2015)  .  Exposure to TB    "mom had it when she was pregnant with me"  . Fibromyalgia   . GERD (gastroesophageal reflux disease)    use to have it but not now  . Heart murmur    87-  12 years old  . Hypertension   . On home oxygen therapy    11/27/2015 "whenever I need it; don't know how many liters"  . Pancreatic cancer ([place])   . Pneumonia    "several times" (11/27/2015)  . Pneumothorax     Past Surgical History:  Procedure Laterality Date  . ANTERIOR CERVICAL DECOMP/DISCECTOMY FUSION     "put 4 screws in"  . BACK SURGERY    . CARDIAC CATHETERIZATION  08/2004   Archie Endo 06/18/2010  . CARDIOVERSION N/A 07/18/2015   Procedure: CARDIOVERSION;  Surgeon: Josue Hector, MD;  [facility];  Service: Cardiovascular;  Laterality: N/A;  . ESOPHAGOGASTRODUODENOSCOPY N/A 08/09/2015   Procedure: ESOPHAGOGASTRODUODENOSCOPY (EGD);  Surgeon: Rogene Houston, MD;  [facility];  Service: Endoscopy;  Laterality: N/A;  . EUS N/A 11/29/2015   Procedure: UPPER ENDOSCOPIC ULTRASOUND (EUS) RADIAL;  Surgeon: Milus Banister, MD;  [facility];  Service: Endoscopy;  Laterality: N/A;  . IR GENERIC HISTORICAL  12/18/2015   IR US GUIDE VASC ACCESS  RIGHT 12/18/2015 Sandi Mariscal, MD [facility]  . IR GENERIC HISTORICAL  12/18/2015   IR FLUORO GUIDE PORT INSERTION RIGHT 12/18/2015 Sandi Mariscal, MD [facility]  . TONSILLECTOMY    . TUBAL LIGATION       reports that she quit smoking about 3 months ago. Her smoking use included Cigarettes. She started smoking about 46 years ago. She has a 23.00 pack-year smoking history. She has never used smokeless tobacco. She reports that she does not drink alcohol or use drugs.  Allergies  Allergen Reactions  . Ibuprofen Nausea And Vomiting  . Other     Seafood   . Penicillins Swelling    Has patient had a PCN reaction causing immediate rash, facial/tongue/throat swelling, SOB or lightheadedness with hypotension: Yes Has patient had a PCN reaction  causing severe rash involving mucus membranes or skin necrosis: No Has patient had a PCN reaction that required hospitalization No Has patient had a PCN reaction occurring within the last 10 years: No If all of the above answers are "NO", then may proceed with Cephalosporin use.   . Tiotropium Bromide Monohydrate Itching  . Tramadol Nausea And Vomiting    Family History  Problem Relation Age of Onset  . COPD Mother   . Diabetes Father   . Stroke Father      Prior to Admission medications   Medication Sig Start Date End Date Taking? Authorizing Provider  COMBIVENT RESPIMAT 20-100 MCG/ACT AERS respimat Inhale 1 puff into the lungs 2 (two) times daily. 12/03/15  Yes Ripudeep Krystal Eaton, MD  diltiazem (CARDIZEM CD) 360 MG 24 hr capsule Take 1 capsule (360 mg total) by mouth daily. 02/06/16  Yes Kathie Dike, MD  docusate sodium (COLACE) 100 MG capsule Take 1 capsule (100 mg total) by mouth 2 (two) times daily. 02/19/16  Yes Thurnell Lose, MD  escitalopram (LEXAPRO) 10 MG tablet Take 1 tablet (10 mg total) by mouth every evening. 12/03/15  Yes Ripudeep Krystal Eaton, MD  Gemcitabine HCl (GEMZAR IV) Inject into the vein. Day 1, day 8, day 15, every 28 days   Yes Historical Provider, MD  HYDROcodone-acetaminophen (NORCO/VICODIN) 5-325 MG tablet Take 1 tablet by mouth every 4 (four) hours as needed for moderate pain (Must last 14 days.Do not take and drive a car or use machinery.). 02/26/16  Yes Sanjuana Kava, MD  ipratropium (ATROVENT) 0.02 % nebulizer solution Take 2.5 mLs (0.5 mg total) by nebulization 3 (three) times daily. 01/23/16  Yes Orvan Falconer, MD  levalbuterol Penne Lash) 1.25 MG/0.5ML nebulizer solution Take 1.25 mg by nebulization every 4 (four) hours as needed for wheezing or shortness of breath. 02/05/16  Yes Kathie Dike, MD  metoprolol tartrate (LOPRESSOR) 25 MG tablet Take 1 tablet (25 mg total) by mouth 2 (two) times daily. 02/05/16  Yes Kathie Dike, MD  ondansetron (ZOFRAN) 8 MG tablet Take  1 tablet by mouth daily as needed for nausea/vomiting. 12/10/15  Yes Historical Provider, MD  OXYGEN Inhale 2 L into the lungs daily as needed (for breathing).   Yes Historical Provider, MD  PACLitaxel Protein-Bound Part (ABRAXANE IV) Inject into the vein. Day 1, day 8, day 15, every 28 days   Yes Historical Provider, MD  pantoprazole (PROTONIX) 40 MG tablet Take 1 tablet (40 mg total) by mouth daily. 12/03/15  Yes Ripudeep Krystal Eaton, MD  potassium chloride SA (K-DUR,KLOR-CON) 20 MEQ tablet Take 2 tablets (40 mEq total) by mouth daily. 02/27/16  Yes Baird Cancer, PA-C  prochlorperazine (COMPAZINE)  10 MG tablet Take 1 tablet by mouth daily as needed for nausea/vomiting. 12/10/15  Yes Historical Provider, MD  SYMBICORT 160-4.5 MCG/ACT inhaler Inhale 1 puff into the lungs 2 (two) times daily. 12/03/15  Yes Ripudeep K Rai, MD  nicotine (NICODERM CQ - DOSED IN MG/24 HOURS) 14 mg/24hr patch Place 1 patch (14 mg total) onto the skin daily. Patient not taking: Reported on 02/29/2016 12/03/15   Ripudeep Krystal Eaton, MD  predniSONE (DELTASONE) 10 MG tablet Take 40mg  po daily for 3 days then 30mg  daily for 3 days then 20mg  daily for 3 days then 10mg  daily for 3 days Patient not taking: Reported on 02/29/2016 02/05/16   Kathie Dike, MD  rivaroxaban (XARELTO) 20 MG TABS tablet Take 1 tablet (20 mg total) by mouth daily with supper. Patient not taking: Reported on 02/29/2016 07/19/15   Reyne Dumas, MD    Physical Exam: Vitals:   02/29/16 1430 02/29/16 1530 02/29/16 1556 02/29/16 1558  BP: 120/69 135/78    Pulse: 72  95   Resp: 23  24   Temp:      TempSrc:      SpO2: 100%  96% 91%  Weight:      Height:          Constitutional: NAD, calm, comfortable Vitals:   02/29/16 1430 02/29/16 1530 02/29/16 1556 02/29/16 1558  BP: 120/69 135/78    Pulse: 72  95   Resp: 23  24   Temp:      TempSrc:      SpO2: 100%  96% 91%  Weight:      Height:       Eyes: PERRL, lids and conjunctivae normal; on O2 2L ENMT:  Mucous membranes are moist. Posterior pharynx clear of any exudate or lesions.Normal dentition.  Neck: normal, supple, no masses, no thyromegaly Respiratory: diffuse exp wheezing.  Cardiovascular: Regular rate and rhythm, no murmurs / rubs / gallops. No extremity edema. 2+ pedal pulses. No carotid bruits.  Abdomen: no tenderness, no masses palpated. No hepatosplenomegaly. Bowel sounds positive.  Musculoskeletal: no clubbing / cyanosis. No joint deformity upper and lower extremities. Good ROM, no contractures. Normal muscle tone.  Skin: no rashes, lesions, ulcers. No induration Neurologic: CN 2-12 grossly intact. Sensation intact, DTR normal. Strength 5/5 in all 4.  Psychiatric: Normal judgment and insight. Alert and oriented x 3. Normal mood.    Labs on Admission: I have personally reviewed following labs and imaging studies  CBC:  Recent Labs Lab 02/23/16 1312 02/27/16 1105 02/29/16 1341  WBC 4.8 6.7 5.4  NEUTROABS  --  4.2 4.7  HGB 8.4* 8.9* 8.0*  HCT 27.2* 29.1* 26.5*  MCV 89.2 93.6 95.7  PLT 109* 189 [id number]   Basic Metabolic Panel:  Recent Labs Lab 02/23/16 1312 02/27/16 1105 02/29/16 1341  NA 140 137 139  K 3.6 2.7* 4.8  CL 108 105 108  CO2 24 23 25   GLUCOSE 130* 104* 103*  BUN 19 23* 15  CREATININE 0.97 0.76 0.67  CALCIUM 8.2* 8.1* 8.6*   GFR: Estimated Creatinine Clearance: 58.9 mL/min (by C-G formula based on SCr of 0.67 mg/dL). Liver Function Tests:  Recent Labs Lab 02/27/16 1105 02/29/16 1341  AST 18 21  ALT 31 41  ALKPHOS 81 78  BILITOT 0.4 0.8  PROT 6.0* 5.9*  ALBUMIN 3.3* 3.1*    Recent Labs Lab 02/29/16 1341  LIPASE 12   No results for input(s): AMMONIA in the last 168 hours. Coagulation Profile: No results  for input(s): INR, PROTIME in the last 168 hours. Cardiac Enzymes:  Recent Labs Lab 02/23/16 1312  TROPONINI <0.03   BNP (last 3 results) No results for input(s): PROBNP in the last 8760 hours. HbA1C: No results for input(s):  HGBA1C in the last 72 hours. CBG: No results for input(s): GLUCAP in the last 168 hours. Lipid Profile: No results for input(s): CHOL, HDL, LDLCALC, TRIG, CHOLHDL, LDLDIRECT in the last 72 hours. Thyroid Function Tests: No results for input(s): TSH, T4TOTAL, FREET4, T3FREE, THYROIDAB in the last 72 hours. Anemia Panel: No results for input(s): VITAMINB12, FOLATE, FERRITIN, TIBC, IRON, RETICCTPCT in the last 72 hours. Urine analysis:    Component Value Date/Time   COLORURINE YELLOW 02/07/2016 1130   APPEARANCEUR HAZY (A) 02/07/2016 1130   LABSPEC 1.015 02/07/2016 1130   PHURINE 7.0 02/07/2016 1130   GLUCOSEU NEGATIVE 02/07/2016 1130   HGBUR NEGATIVE 02/07/2016 1130   BILIRUBINUR NEGATIVE 02/07/2016 1130   KETONESUR NEGATIVE 02/07/2016 1130   PROTEINUR NEGATIVE 02/07/2016 1130   NITRITE NEGATIVE 02/07/2016 1130   LEUKOCYTESUR TRACE (A) 02/07/2016 1130   Sepsis Labs: !!!!!!!!!!!!!!!!!!!!!!!!!!!!!!!!!!!!!!!!!!!! @LABRCNTIP (procalcitonin:4,lacticidven:4) )No results found for this or any previous visit (from the past 240 hour(s)).   Radiological Exams on Admission: Dg Chest 2 View  Result Date: 02/29/2016 CLINICAL DATA:  Shortness of Breath EXAM: CHEST  2 VIEW COMPARISON:  02/23/2016 FINDINGS: Right Port-A-Cath remains in place, unchanged. There is hyperinflation of the lungs compatible with COPD. Biapical scarring. Heart is upper limits normal in size. No effusions or confluent airspace opacities. IMPRESSION: COPD/chronic changes.  Borderline heart size.  No active disease. Electronically Signed   By: Rolm Baptise M.D.   On: 02/29/2016 14:18    EKG: Independently reviewed. Rate controlled A fib.   Assessment/Plan Active Problems:   Tobacco abuse   Anemia   Atrial fibrillation ([facility])   Chronic obstructive pulmonary disease (COPD) ([facility])   Chronic anticoagulation-Xarelto   Pancreatic cancer ([facility])   COPD with acute exacerbation ([facility])   Malnutrition of moderate degree   COPD  exacerbation ([place])  1. Acute Moderate to Severe COPD exacerbation with Hypoxia on Room Air, Uses supplemental o2 at home -admit for further care to med-surg  -solumedrol 40mg  IV q6hrs, Nebs q4hrs, mucinex prn  -will add Levaquin for possible any underlying PNA. Check Procalcitonin  -Resp panel -supplemental O2 as needed  -accuchecks ISS while on IV steroids  -counseled to quit smoking  2. Chronic A fib, rate controlled  -cont Bb, CCB and Xarelto  3. Anemia, chronic -likely from chronic disease, plus she is on chemo  -cont to monitor for now   4. Pancreatic Adenocarcinoma -currently on outpatient chemo, her last tx is next week. She can follow it up outpatient -pain control, bowel regimen if needed    5. HTN -cont lopressor and cardizem at this time   6. Anxiety/Depression -cont home medications    DVT prophylaxis: On Xarelto Code Status: Full Family Communication:None needed, patient can comprehend well  Disposition Plan: Admitted for further care- Med/Surg Admission status: Observation   Silvio Sausedo Arsenio Loader MD [facility]   If 7PM-7AM, please contact night-coverage [email or web address] Password [facility]  02/29/2016, 4:17 PM

## 2016-03-01 DIAGNOSIS — C259 Malignant neoplasm of pancreas, unspecified: Secondary | ICD-10-CM | POA: Diagnosis present

## 2016-03-01 DIAGNOSIS — F17210 Nicotine dependence, cigarettes, uncomplicated: Secondary | ICD-10-CM | POA: Diagnosis present

## 2016-03-01 DIAGNOSIS — Z885 Allergy status to narcotic agent status: Secondary | ICD-10-CM | POA: Diagnosis not present

## 2016-03-01 DIAGNOSIS — Z888 Allergy status to other drugs, medicaments and biological substances status: Secondary | ICD-10-CM | POA: Diagnosis not present

## 2016-03-01 DIAGNOSIS — J9611 Chronic respiratory failure with hypoxia: Secondary | ICD-10-CM | POA: Diagnosis present

## 2016-03-01 DIAGNOSIS — R000 Tachycardia, unspecified: Secondary | ICD-10-CM | POA: Diagnosis present

## 2016-03-01 DIAGNOSIS — D6481 Anemia due to antineoplastic chemotherapy: Secondary | ICD-10-CM | POA: Diagnosis not present

## 2016-03-01 DIAGNOSIS — R0602 Shortness of breath: Secondary | ICD-10-CM | POA: Diagnosis present

## 2016-03-01 DIAGNOSIS — Z880 Allergy status to penicillin: Secondary | ICD-10-CM | POA: Diagnosis not present

## 2016-03-01 DIAGNOSIS — J441 Chronic obstructive pulmonary disease with (acute) exacerbation: Secondary | ICD-10-CM | POA: Diagnosis not present

## 2016-03-01 DIAGNOSIS — Z7901 Long term (current) use of anticoagulants: Secondary | ICD-10-CM | POA: Diagnosis not present

## 2016-03-01 DIAGNOSIS — F418 Other specified anxiety disorders: Secondary | ICD-10-CM | POA: Diagnosis present

## 2016-03-01 DIAGNOSIS — I482 Chronic atrial fibrillation: Secondary | ICD-10-CM

## 2016-03-01 DIAGNOSIS — M797 Fibromyalgia: Secondary | ICD-10-CM | POA: Diagnosis present

## 2016-03-01 DIAGNOSIS — D638 Anemia in other chronic diseases classified elsewhere: Secondary | ICD-10-CM | POA: Diagnosis present

## 2016-03-01 DIAGNOSIS — Z886 Allergy status to analgesic agent status: Secondary | ICD-10-CM | POA: Diagnosis not present

## 2016-03-01 DIAGNOSIS — K219 Gastro-esophageal reflux disease without esophagitis: Secondary | ICD-10-CM | POA: Diagnosis present

## 2016-03-01 DIAGNOSIS — J42 Unspecified chronic bronchitis: Secondary | ICD-10-CM | POA: Diagnosis not present

## 2016-03-01 DIAGNOSIS — Z981 Arthrodesis status: Secondary | ICD-10-CM | POA: Diagnosis not present

## 2016-03-01 DIAGNOSIS — Z91013 Allergy to seafood: Secondary | ICD-10-CM | POA: Diagnosis not present

## 2016-03-01 DIAGNOSIS — Z201 Contact with and (suspected) exposure to tuberculosis: Secondary | ICD-10-CM | POA: Diagnosis present

## 2016-03-01 DIAGNOSIS — M1611 Unilateral primary osteoarthritis, right hip: Secondary | ICD-10-CM | POA: Diagnosis present

## 2016-03-01 DIAGNOSIS — C257 Malignant neoplasm of other parts of pancreas: Secondary | ICD-10-CM

## 2016-03-01 DIAGNOSIS — Z6822 Body mass index (BMI) 22.0-22.9, adult: Secondary | ICD-10-CM | POA: Diagnosis not present

## 2016-03-01 DIAGNOSIS — I10 Essential (primary) hypertension: Secondary | ICD-10-CM | POA: Diagnosis present

## 2016-03-01 DIAGNOSIS — Z9981 Dependence on supplemental oxygen: Secondary | ICD-10-CM | POA: Diagnosis not present

## 2016-03-01 DIAGNOSIS — Z8701 Personal history of pneumonia (recurrent): Secondary | ICD-10-CM | POA: Diagnosis not present

## 2016-03-01 DIAGNOSIS — E440 Moderate protein-calorie malnutrition: Secondary | ICD-10-CM | POA: Diagnosis present

## 2016-03-01 LAB — CBC
HCT: 27.4 % — ABNORMAL LOW (ref 36.0–46.0)
HEMOGLOBIN: 8.4 g/dL — AB (ref 12.0–15.0)
MCH: 28.9 pg (ref 26.0–34.0)
MCHC: 30.7 g/dL (ref 30.0–36.0)
MCV: 94.2 fL (ref 78.0–100.0)
Platelets: 208 10*3/uL (ref 150–400)
RBC: 2.91 MIL/uL — AB (ref 3.87–5.11)
RDW: 24.2 % — ABNORMAL HIGH (ref 11.5–15.5)
WBC: 6 10*3/uL (ref 4.0–10.5)

## 2016-03-01 LAB — BASIC METABOLIC PANEL
Anion gap: 8 (ref 5–15)
BUN: 19 mg/dL (ref 6–20)
CHLORIDE: 104 mmol/L (ref 101–111)
CO2: 25 mmol/L (ref 22–32)
CREATININE: 0.81 mg/dL (ref 0.44–1.00)
Calcium: 8.7 mg/dL — ABNORMAL LOW (ref 8.9–10.3)
GFR calc non Af Amer: >60 mL/min (ref >60)
GLUCOSE: 155 mg/dL — AB (ref 65–99)
Potassium: 5 mmol/L (ref 3.5–5.1)
Sodium: 137 mmol/L (ref 135–145)

## 2016-03-01 LAB — GLUCOSE, CAPILLARY
GLUCOSE-CAPILLARY: 150 mg/dL — AB (ref 65–99)
GLUCOSE-CAPILLARY: 111 mg/dL — AB (ref 65–99)
GLUCOSE-CAPILLARY: 120 mg/dL — AB (ref 65–99)

## 2016-03-01 MED ORDER — METHYLPREDNISOLONE SODIUM SUCC 125 MG IJ SOLR
60.0000 mg | Freq: Four times a day (QID) | INTRAMUSCULAR | Status: DC
  Administered 2016-03-01 – 2016-03-02 (×5): 60 mg via INTRAVENOUS
  Filled 2016-03-01 (×5): qty 2

## 2016-03-01 MED ORDER — GUAIFENESIN ER 600 MG PO TB12
1200.0000 mg | ORAL_TABLET | Freq: Two times a day (BID) | ORAL | Status: DC
  Administered 2016-03-01 – 2016-03-02 (×2): 1200 mg via ORAL
  Filled 2016-03-01 (×2): qty 2

## 2016-03-01 NOTE — Progress Notes (Signed)
PROGRESS NOTE    Lindsay Salazar  [id number] DOB: [birth year] DOA: 02/29/2016 PCP: [name], PA-C    Brief Narrative:  66 year old female with a history of COPD, chronic respiratory failure, atrial fibrillation, who has multiple hospitalizations for COPD exacerbation, presents to the hospital with shortness of breath. Patient continues to smoke at home. She presented with shortness of breath and wheezing. It was felt that she has COPD exacerbation and she was admitted for further treatments.   Assessment & Plan:   Active Problems:   Tobacco abuse   Anemia   Atrial fibrillation (HCC)   Chronic obstructive pulmonary disease (COPD) (HCC)   Chronic anticoagulation-Xarelto   Pancreatic cancer (HCC)   COPD with acute exacerbation (HCC)   Malnutrition of moderate degree   COPD exacerbation ([place])   1. COPD exacerbation. Patient continues to wheeze and feel short of breath. Will start on intravenous steroids. Continue bronchodilators and antibiotics.  2. Chronic respiratory failure. Oxygen requirement appears to be near baseline.  3. Tobacco use. Patient counseled on importance of tobacco cessation.  4. Chronic atrial fibrillation. Rate controlled. Continue beta blockers. Anticoagulated with Xarelto  5. History of pancreatic carcinoma. Follow-up with oncology for chemotherapy.  6. Hypertension. Appears stable   DVT prophylaxis: xarelto Code Status: full  Family Communication: no family present  Disposition Plan: discharge home, likely in AM   Consultants:     Procedures:     Antimicrobials:   levaquin 1/26>>   Subjective: Feeling better, still short of breath and wheezing  Objective: Vitals:   03/01/16 0923 03/01/16 1142 03/01/16 1452 03/01/16 1530  BP:   (!) 118/54   Pulse: 96  70   Resp:   19   Temp:   98.2 F (36.8 C)   TempSrc:   Oral   SpO2:  100% 100% 100%  Weight:      Height:        Intake/Output Summary (Last 24 hours) at 03/01/16  1620 Last data filed at 03/01/16 1300  Gross per 24 hour  Intake              480 ml  Output              150 ml  Net              330 ml   Filed Weights   02/29/16 1305 02/29/16 1805 03/01/16 0602  Weight: 56.2 kg (124 lb) 54.9 kg (121 lb) 58.4 kg (128 lb 12 oz)    Examination:  General exam: Appears calm and comfortable  Respiratory system: bilateral wheezing. Respiratory effort normal. Cardiovascular system: S1 & S2 heard, RRR. No JVD, murmurs, rubs, gallops or clicks. No pedal edema. Gastrointestinal system: Abdomen is nondistended, soft and nontender. No organomegaly or masses felt. Normal bowel sounds heard. Central nervous system: Alert and oriented. No focal neurological deficits. Extremities: Symmetric 5 x 5 power. Skin: No rashes, lesions or ulcers Psychiatry: Judgement and insight appear normal. Mood & affect appropriate.     Data Reviewed: I have personally reviewed following labs and imaging studies  CBC:  Recent Labs Lab 02/27/16 1105 02/29/16 1341 03/01/16 0748  WBC 6.7 5.4 6.0  NEUTROABS 4.2 4.7  --   HGB 8.9* 8.0* 8.4*  HCT 29.1* 26.5* 27.4*  MCV 93.6 95.7 94.2  PLT 189 185 [id number]   Basic Metabolic Panel:  Recent Labs Lab 02/27/16 1105 02/29/16 1341 03/01/16 0748  NA 137 139 137  K 2.7* 4.8 5.0  CL  105 108 104  CO2 23 25 25   GLUCOSE 104* 103* 155*  BUN 23* 15 19  CREATININE 0.76 0.67 0.81  CALCIUM 8.1* 8.6* 8.7*   GFR: Estimated Creatinine Clearance: 58.2 mL/min (by C-G formula based on SCr of 0.81 mg/dL). Liver Function Tests:  Recent Labs Lab 02/27/16 1105 02/29/16 1341  AST 18 21  ALT 31 41  ALKPHOS 81 78  BILITOT 0.4 0.8  PROT 6.0* 5.9*  ALBUMIN 3.3* 3.1*    Recent Labs Lab 02/29/16 1341  LIPASE 12   No results for input(s): AMMONIA in the last 168 hours. Coagulation Profile: No results for input(s): INR, PROTIME in the last 168 hours. Cardiac Enzymes: No results for input(s): CKTOTAL, CKMB, CKMBINDEX, TROPONINI in  the last 168 hours. BNP (last 3 results) No results for input(s): PROBNP in the last 8760 hours. HbA1C: No results for input(s): HGBA1C in the last 72 hours. CBG:  Recent Labs Lab 02/29/16 1853 02/29/16 2125 03/01/16 0830  GLUCAP 126* 176* 150*   Lipid Profile: No results for input(s): CHOL, HDL, LDLCALC, TRIG, CHOLHDL, LDLDIRECT in the last 72 hours. Thyroid Function Tests: No results for input(s): TSH, T4TOTAL, FREET4, T3FREE, THYROIDAB in the last 72 hours. Anemia Panel: No results for input(s): VITAMINB12, FOLATE, FERRITIN, TIBC, IRON, RETICCTPCT in the last 72 hours. Sepsis Labs:  Recent Labs Lab 02/29/16 1341  PROCALCITON 0.14    No results found for this or any previous visit (from the past 240 hour(s)).       Radiology Studies: Dg Chest 2 View  Result Date: 02/29/2016 CLINICAL DATA:  Shortness of Breath EXAM: CHEST  2 VIEW COMPARISON:  02/23/2016 FINDINGS: Right Port-A-Cath remains in place, unchanged. There is hyperinflation of the lungs compatible with COPD. Biapical scarring. Heart is upper limits normal in size. No effusions or confluent airspace opacities. IMPRESSION: COPD/chronic changes.  Borderline heart size.  No active disease. Electronically Signed   By: Rolm Baptise M.D.   On: 02/29/2016 14:18        Scheduled Meds: . diltiazem  360 mg Oral Daily  . docusate sodium  100 mg Oral BID  . escitalopram  10 mg Oral QPM  . feeding supplement (ENSURE ENLIVE)  237 mL Oral BID BM  . ipratropium-albuterol  3 mL Nebulization BID  . levalbuterol  1.25 mg Nebulization Q4H  . levofloxacin (LEVAQUIN) IV  750 mg Intravenous Q24H  . methylPREDNISolone (SOLU-MEDROL) injection  60 mg Intravenous Q6H  . metoprolol tartrate  25 mg Oral BID  . mometasone-formoterol  2 puff Inhalation BID  . nicotine  14 mg Transdermal Daily  . pantoprazole  40 mg Oral Daily  . rivaroxaban  20 mg Oral Q supper   Continuous Infusions:   LOS: 0 days    Time spent:  33mins    Carter Kaman, MD [facility] Pager [phone]  If 7PM-7AM, please contact night-coverage [email or web address] Password [email or web address] 03/01/2016, 4:20 PM

## 2016-03-02 DIAGNOSIS — Z7901 Long term (current) use of anticoagulants: Secondary | ICD-10-CM

## 2016-03-02 DIAGNOSIS — Z720 Tobacco use: Secondary | ICD-10-CM

## 2016-03-02 DIAGNOSIS — D6481 Anemia due to antineoplastic chemotherapy: Secondary | ICD-10-CM

## 2016-03-02 DIAGNOSIS — J9611 Chronic respiratory failure with hypoxia: Secondary | ICD-10-CM

## 2016-03-02 DIAGNOSIS — T451X5A Adverse effect of antineoplastic and immunosuppressive drugs, initial encounter: Secondary | ICD-10-CM

## 2016-03-02 LAB — GLUCOSE, CAPILLARY
GLUCOSE-CAPILLARY: 162 mg/dL — AB (ref 65–99)
Glucose-Capillary: 197 mg/dL — ABNORMAL HIGH (ref 65–99)

## 2016-03-02 LAB — PROCALCITONIN: Procalcitonin: 0.1 ng/mL

## 2016-03-02 MED ORDER — METOPROLOL TARTRATE 25 MG PO TABS
25.0000 mg | ORAL_TABLET | Freq: Once | ORAL | Status: AC
  Administered 2016-03-02: 25 mg via ORAL
  Filled 2016-03-02: qty 1

## 2016-03-02 MED ORDER — PREDNISONE 10 MG PO TABS: ORAL_TABLET | ORAL | 0 refills | Status: DC

## 2016-03-02 MED ORDER — LEVOFLOXACIN 750 MG PO TABS: 750.0000 mg | ORAL_TABLET | Freq: Every day | ORAL | 0 refills | Status: DC

## 2016-03-02 MED ORDER — METOPROLOL TARTRATE 50 MG PO TABS
50.0000 mg | ORAL_TABLET | Freq: Two times a day (BID) | ORAL | Status: DC
Start: 2016-03-02 — End: 2016-03-02

## 2016-03-02 MED ORDER — METOPROLOL TARTRATE 50 MG PO TABS: 50.0000 mg | ORAL_TABLET | Freq: Two times a day (BID) | ORAL | 0 refills | Status: AC

## 2016-03-02 MED ORDER — GUAIFENESIN ER 600 MG PO TB12: 600.0000 mg | ORAL_TABLET | Freq: Two times a day (BID) | ORAL | 0 refills | Status: DC

## 2016-03-02 NOTE — Discharge Summary (Addendum)
Physician Discharge Summary  Lindsay Salazar [facility] [id number] DOB: 1950/07/29 DOA: 02/29/2016  PCP: [name], PA-C  Admit date: 02/29/2016 Discharge date: 03/02/2016  Admitted From: home Disposition:  home  Recommendations for Outpatient Follow-up:  1. Follow up with PCP in 1-2 weeks   Home Health: Equipment/Devices  Discharge Condition: stable CODE STATUS:full code Diet recommendation: Heart Healthy  Brief/Interim Summary: 66 year old female with a history of COPD, chronic respiratory failure, atrial fibrillation, who has multiple hospitalizations for COPD exacerbation, presents to the hospital with shortness of breath. Patient continues to smoke at home. She presented with shortness of breath and wheezing. It was felt that she has COPD exacerbation and she was admitted for further treatments.  Patient was admitted to the hospital and start her on telemetry. She was treated with intravenous steroids, antibiotics and through the dilators. Her respiratory status appears to have returned to baseline. She is having some mild wheeze, but this is her baseline. She is able to carry on a conversation without difficulty. She is continued on her home level of supplemental oxygen. She continues to smoke and was strongly advised to quit smoking.  She did have some episodes of tachycardia and metoprolol was increased to 50 mg twice a day with improvement of heart rate. She is also continued on Cardizem 360 mg daily.  Discharge Diagnoses:  Active Problems:   Tobacco abuse   Anemia, chronic disease.   Atrial fibrillation (HCC)   Chronic obstructive pulmonary disease (COPD) (HCC)   Chronic anticoagulation-Xarelto   Pancreatic cancer (HCC)   COPD with acute exacerbation (HCC)   Chronic respiratory failure with hypoxia (HCC)   Malnutrition of moderate degree   COPD exacerbation (HCC)    Discharge Instructions  Discharge Instructions    Diet - low sodium heart healthy    Complete by:  As  directed    Increase activity slowly    Complete by:  As directed      Allergies as of 03/02/2016      Reactions   Ibuprofen Nausea And Vomiting   Other    Seafood   Penicillins Swelling   Has patient had a PCN reaction causing immediate rash, facial/tongue/throat swelling, SOB or lightheadedness with hypotension: Yes Has patient had a PCN reaction causing severe rash involving mucus membranes or skin necrosis: No Has patient had a PCN reaction that required hospitalization No Has patient had a PCN reaction occurring within the last 10 years: No If all of the above answers are "NO", then may proceed with Cephalosporin use.   Tiotropium Bromide Monohydrate Itching   Tramadol Nausea And Vomiting      Medication List    TAKE these medications   ABRAXANE IV Inject into the vein. Day 1, day 8, day 15, every 28 days   COMBIVENT RESPIMAT 20-100 MCG/ACT Aers respimat Generic drug:  Ipratropium-Albuterol Inhale 1 puff into the lungs 2 (two) times daily.   diltiazem 360 MG 24 hr capsule Commonly known as:  CARDIZEM CD Take 1 capsule (360 mg total) by mouth daily.   docusate sodium 100 MG capsule Commonly known as:  COLACE Take 1 capsule (100 mg total) by mouth 2 (two) times daily.   escitalopram 10 MG tablet Commonly known as:  LEXAPRO Take 1 tablet (10 mg total) by mouth every evening.   GEMZAR IV Inject into the vein. Day 1, day 8, day 15, every 28 days   guaiFENesin 600 MG 12 hr tablet Commonly known as:  MUCINEX Take 1 tablet (600  mg total) by mouth 2 (two) times daily.   HYDROcodone-acetaminophen 5-325 MG tablet Commonly known as:  NORCO/VICODIN Take 1 tablet by mouth every 4 (four) hours as needed for moderate pain (Must last 14 days.Do not take and drive a car or use machinery.).   ipratropium 0.02 % nebulizer solution Commonly known as:  ATROVENT Take 2.5 mLs (0.5 mg total) by nebulization 3 (three) times daily.   levalbuterol 1.25 MG/0.5ML nebulizer  solution Commonly known as:  XOPENEX Take 1.25 mg by nebulization every 4 (four) hours as needed for wheezing or shortness of breath.   levofloxacin 750 MG tablet Commonly known as:  LEVAQUIN Take 1 tablet (750 mg total) by mouth daily.   metoprolol 50 MG tablet Commonly known as:  LOPRESSOR Take 1 tablet (50 mg total) by mouth 2 (two) times daily. What changed:  medication strength  how much to take   nicotine 14 mg/24hr patch Commonly known as:  NICODERM CQ - dosed in mg/24 hours Place 1 patch (14 mg total) onto the skin daily.   ondansetron 8 MG tablet Commonly known as:  ZOFRAN Take 1 tablet by mouth daily as needed for nausea/vomiting.   OXYGEN Inhale 2 L into the lungs daily as needed (for breathing).   pantoprazole 40 MG tablet Commonly known as:  PROTONIX Take 1 tablet (40 mg total) by mouth daily.   potassium chloride SA 20 MEQ tablet Commonly known as:  K-DUR,KLOR-CON Take 2 tablets (40 mEq total) by mouth daily.   predniSONE 10 MG tablet Commonly known as:  DELTASONE Take 40mg  po daily for 3 days then 30mg  daily for 3 days then 20mg  daily for 3 days then 10mg  daily for 3 days   prochlorperazine 10 MG tablet Commonly known as:  COMPAZINE Take 1 tablet by mouth daily as needed for nausea/vomiting.   rivaroxaban 20 MG Tabs tablet Commonly known as:  XARELTO Take 1 tablet (20 mg total) by mouth daily with supper.   SYMBICORT 160-4.5 MCG/ACT inhaler Generic drug:  budesonide-formoterol Inhale 1 puff into the lungs 2 (two) times daily.       Allergies  Allergen Reactions  . Ibuprofen Nausea And Vomiting  . Other     Seafood   . Penicillins Swelling    Has patient had a PCN reaction causing immediate rash, facial/tongue/throat swelling, SOB or lightheadedness with hypotension: Yes Has patient had a PCN reaction causing severe rash involving mucus membranes or skin necrosis: No Has patient had a PCN reaction that required hospitalization No Has  patient had a PCN reaction occurring within the last 10 years: No If all of the above answers are "NO", then may proceed with Cephalosporin use.   . Tiotropium Bromide Monohydrate Itching  . Tramadol Nausea And Vomiting    Consultations:     Procedures/Studies: Dg Chest 2 View  Result Date: 02/29/2016 CLINICAL DATA:  Shortness of Breath EXAM: CHEST  2 VIEW COMPARISON:  02/23/2016 FINDINGS: Right Port-A-Cath remains in place, unchanged. There is hyperinflation of the lungs compatible with COPD. Biapical scarring. Heart is upper limits normal in size. No effusions or confluent airspace opacities. IMPRESSION: COPD/chronic changes.  Borderline heart size.  No active disease. Electronically Signed   By: Rolm Baptise M.D.   On: 02/29/2016 14:18   Dg Chest 2 View  Result Date: 02/23/2016 CLINICAL DATA:  Chest pain.  Cough. EXAM: CHEST  2 VIEW COMPARISON:  February 11, 2016 FINDINGS: Stable right Port-A-Cath. The heart, hila, mediastinum, lungs, and pleura are otherwise  unchanged and unremarkable. IMPRESSION: No active cardiopulmonary disease. Electronically Signed   By: Dorise Bullion III M.D   On: 02/23/2016 13:56   Dg Chest 2 View  Result Date: 02/11/2016 CLINICAL DATA:  Chest pain, abdominal pain, shortness of breath EXAM: CHEST  2 VIEW COMPARISON:  02/07/2016 FINDINGS: There is a right-sided Port-A-Cath in satisfactory position. The lungs are hyperinflated likely secondary to COPD. There is mild biapical pleural thickening. There is no focal parenchymal opacity. There is no pleural effusion or pneumothorax. The heart and mediastinal contours are unremarkable. The osseous structures are unremarkable. IMPRESSION: No active cardiopulmonary disease. Electronically Signed   By: Kathreen Devoid   On: 02/11/2016 11:37   Dg Chest 2 View  Result Date: 02/07/2016 CLINICAL DATA:  Pancreatic carcinoma. Shortness of breath with exertion EXAM: CHEST  2 VIEW COMPARISON:  January 31, 2016 FINDINGS: Scarring in  each lung apex region is stable. There is no edema or consolidation. Heart size and pulmonary vascularity are normal. No adenopathy. There is atherosclerotic calcification in the aorta. Port-A-Cath tip is at the cavoatrial junction. No pneumothorax. There is postoperative change in the lower cervical spine. There are no blastic or lytic bone lesions. IMPRESSION: Stable scarring in each lung apex. No edema or consolidation. No evident adenopathy. There is aortic atherosclerosis. Electronically Signed   By: Lowella Grip III M.D.   On: 02/07/2016 13:46       Subjective: Feels breathing is improving  Discharge Exam: Vitals:   03/01/16 2011 03/02/16 0547  BP: (!) 114/55 131/80  Pulse: 90 73  Resp: 18 18  Temp: 98.5 F (36.9 C) 97.8 F (36.6 C)   Vitals:   03/02/16 0812 03/02/16 0821 03/02/16 1150 03/02/16 1509  BP:      Pulse:      Resp:      Temp:      TempSrc:      SpO2: 100% 100% 100% 99%  Weight:      Height:        General: Pt is alert, awake, not in acute distress Cardiovascular: RRR, S1/S2 +, no rubs, no gallops Respiratory: diminished breath sounds with fair air entry bilaterally. Mild wheeze in upper airways with forced expiration Abdominal: Soft, NT, ND, bowel sounds + Extremities: no edema, no cyanosis    The results of significant diagnostics from this hospitalization (including imaging, microbiology, ancillary and laboratory) are listed below for reference.     Microbiology: No results found for this or any previous visit (from the past 240 hour(s)).   Labs: BNP (last 3 results)  Recent Labs  07/17/15 0358 01/28/16 0119 02/29/16 1341  BNP 415.0* 113.0* [id number]*   Basic Metabolic Panel:  Recent Labs Lab 02/27/16 1105 02/29/16 1341 03/01/16 0748  NA 137 139 137  K 2.7* 4.8 5.0  CL 105 108 104  CO2 23 25 25   GLUCOSE 104* 103* 155*  BUN 23* 15 19  CREATININE 0.76 0.67 0.81  CALCIUM 8.1* 8.6* 8.7*   Liver Function Tests:  Recent Labs Lab  02/27/16 1105 02/29/16 1341  AST 18 21  ALT 31 41  ALKPHOS 81 78  BILITOT 0.4 0.8  PROT 6.0* 5.9*  ALBUMIN 3.3* 3.1*    Recent Labs Lab 02/29/16 1341  LIPASE 12   No results for input(s): AMMONIA in the last 168 hours. CBC:  Recent Labs Lab 02/27/16 1105 02/29/16 1341 03/01/16 0748  WBC 6.7 5.4 6.0  NEUTROABS 4.2 4.7  --   HGB 8.9* 8.0* 8.4*  HCT 29.1* 26.5* 27.4*  MCV 93.6 95.7 94.2  PLT 189 185 208   Cardiac Enzymes: No results for input(s): CKTOTAL, CKMB, CKMBINDEX, TROPONINI in the last 168 hours. BNP: Invalid input(s): POCBNP CBG:  Recent Labs Lab 03/01/16 0830 03/01/16 1643 03/01/16 2152 03/02/16 0729 03/02/16 1107  GLUCAP 150* 111* 120* 162* 197*   D-Dimer No results for input(s): DDIMER in the last 72 hours. Hgb A1c No results for input(s): HGBA1C in the last 72 hours. Lipid Profile No results for input(s): CHOL, HDL, LDLCALC, TRIG, CHOLHDL, LDLDIRECT in the last 72 hours. Thyroid function studies No results for input(s): TSH, T4TOTAL, T3FREE, THYROIDAB in the last 72 hours.  Invalid input(s): FREET3 Anemia work up No results for input(s): VITAMINB12, FOLATE, FERRITIN, TIBC, IRON, RETICCTPCT in the last 72 hours. Urinalysis    Component Value Date/Time   COLORURINE YELLOW 02/07/2016 1130   APPEARANCEUR HAZY (A) 02/07/2016 1130   LABSPEC 1.015 02/07/2016 1130   PHURINE 7.0 02/07/2016 1130   GLUCOSEU NEGATIVE 02/07/2016 1130   HGBUR NEGATIVE 02/07/2016 1130   BILIRUBINUR NEGATIVE 02/07/2016 1130   KETONESUR NEGATIVE 02/07/2016 1130   PROTEINUR NEGATIVE 02/07/2016 1130   NITRITE NEGATIVE 02/07/2016 1130   LEUKOCYTESUR TRACE (A) 02/07/2016 1130   Sepsis Labs Invalid input(s): PROCALCITONIN,  WBC,  LACTICIDVEN Microbiology No results found for this or any previous visit (from the past 240 hour(s)).   Time coordinating discharge: Over 30 minutes  SIGNED:   Kathie Dike, MD  [facility] 03/02/2016, 3:49 PM Pager   If  7PM-7AM, please contact night-coverage [email or web address] Password [email or web address]

## 2016-03-02 NOTE — Discharge Instructions (Signed)
Chronic Obstructive Pulmonary Disease Chronic obstructive pulmonary disease (COPD) is a common lung problem. In COPD, the flow of air from the lungs is limited. The way your lungs work will probably never return to normal, but there are things you can do to improve your lungs and make yourself feel better. Your doctor may treat your condition with:  Medicines.  Oxygen.  Lung surgery.  Changes to your diet.  Rehabilitation. This may involve a team of specialists. Follow these instructions at home:  Take all medicines as told by your doctor.  Avoid medicines or cough syrups that dry up your airway (such as antihistamines) and do not allow you to get rid of thick spit. You do not need to avoid them if told differently by your doctor.  If you smoke, stop. Smoking makes the problem worse.  Avoid being around things that make your breathing worse (like smoke, chemicals, and fumes).  Use oxygen therapy and therapy to help improve your lungs (pulmonary rehabilitation) if told by your doctor. If you need home oxygen therapy, ask your doctor if you should buy a tool to measure your oxygen level (oximeter).  Avoid people who have a sickness you can catch (contagious).  Avoid going outside when it is very hot, cold, or humid.  Eat healthy foods. Eat smaller meals more often. Rest before meals.  Stay active, but remember to also rest.  Make sure to get all the shots (vaccines) your doctor recommends. Ask your doctor if you need a pneumonia shot.  Learn and use tips on how to relax.  Learn and use tips on how to control your breathing as told by your doctor. Try: 1. Breathing in (inhaling) through your nose for 1 second. Then, pucker your lips and breath out (exhale) through your lips for 2 seconds. 2. Putting one hand on your belly (abdomen). Breathe in slowly through your nose for 1 second. Your hand on your belly should move out. Pucker your lips and breathe out slowly through your lips.  Your hand on your belly should move in as you breathe out.  Learn and use controlled coughing to clear thick spit from your lungs. The steps are: 1. Lean your head a little forward. 2. Breathe in deeply. 3. Try to hold your breath for 3 seconds. 4. Keep your mouth slightly open while coughing 2 times. 5. Spit any thick spit out into a tissue. 6. Rest and do the steps again 1 or 2 times as needed. Contact a doctor if:  You cough up more thick spit than usual.  There is a change in the color or thickness of the spit.  It is harder to breathe than usual.  Your breathing is faster than usual. Get help right away if:  You have shortness of breath while resting.  You have shortness of breath that stops you from:  Being able to talk.  Doing normal activities.  You chest hurts for longer than 5 minutes.  Your skin color is more blue than usual.  Your pulse oximeter shows that you have low oxygen for longer than 5 minutes. This information is not intended to replace advice given to you by your health care provider. Make sure you discuss any questions you have with your health care provider. Document Released: 07/09/2007 Document Revised: 06/28/2015 Document Reviewed: 09/16/2012 Elsevier Interactive Patient Education  2017 [employer]vier Inc.  

## 2016-03-03 ENCOUNTER — Other Ambulatory Visit (HOSPITAL_COMMUNITY): Payer: Medicare HMO

## 2016-03-03 ENCOUNTER — Other Ambulatory Visit (HOSPITAL_COMMUNITY): Payer: Self-pay | Admitting: *Deleted

## 2016-03-03 DIAGNOSIS — C257 Malignant neoplasm of other parts of pancreas: Secondary | ICD-10-CM

## 2016-03-03 LAB — GLUCOSE, CAPILLARY: Glucose-Capillary: 127 mg/dL — ABNORMAL HIGH (ref 65–99)

## 2016-03-05 ENCOUNTER — Ambulatory Visit (HOSPITAL_COMMUNITY): Payer: Medicare HMO

## 2016-03-05 ENCOUNTER — Encounter (HOSPITAL_BASED_OUTPATIENT_CLINIC_OR_DEPARTMENT_OTHER): Payer: Medicare HMO

## 2016-03-05 VITALS — BP 118/61 | HR 64 | Temp 98.2°F | Resp 18 | Wt 128.0 lb

## 2016-03-05 DIAGNOSIS — Z5111 Encounter for antineoplastic chemotherapy: Secondary | ICD-10-CM

## 2016-03-05 DIAGNOSIS — C251 Malignant neoplasm of body of pancreas: Secondary | ICD-10-CM | POA: Diagnosis present

## 2016-03-05 DIAGNOSIS — D649 Anemia, unspecified: Secondary | ICD-10-CM

## 2016-03-05 DIAGNOSIS — D6481 Anemia due to antineoplastic chemotherapy: Secondary | ICD-10-CM

## 2016-03-05 DIAGNOSIS — T451X5A Adverse effect of antineoplastic and immunosuppressive drugs, initial encounter: Principal | ICD-10-CM

## 2016-03-05 LAB — COMPREHENSIVE METABOLIC PANEL
ALBUMIN: 2.9 g/dL — AB (ref 3.5–5.0)
ALK PHOS: 66 U/L (ref 38–126)
ALT: 42 U/L (ref 14–54)
ANION GAP: 9 (ref 5–15)
AST: 24 U/L (ref 15–41)
BILIRUBIN TOTAL: 0.3 mg/dL (ref 0.3–1.2)
BUN: 24 mg/dL — AB (ref 6–20)
CO2: 25 mmol/L (ref 22–32)
Calcium: 8.3 mg/dL — ABNORMAL LOW (ref 8.9–10.3)
Chloride: 105 mmol/L (ref 101–111)
Creatinine, Ser: 0.8 mg/dL (ref 0.44–1.00)
GFR calc Af Amer: >60 mL/min (ref >60)
GFR calc non Af Amer: >60 mL/min (ref >60)
GLUCOSE: 114 mg/dL — AB (ref 65–99)
POTASSIUM: 3.7 mmol/L (ref 3.5–5.1)
SODIUM: 139 mmol/L (ref 135–145)
Total Protein: 5.4 g/dL — ABNORMAL LOW (ref 6.5–8.1)

## 2016-03-05 LAB — CBC WITH DIFFERENTIAL/PLATELET
BASOS ABS: 0 10*3/uL (ref 0.0–0.1)
Basophils Relative: 0 %
EOS ABS: 0 10*3/uL (ref 0.0–0.7)
Eosinophils Relative: 0 %
HEMATOCRIT: 28.4 % — AB (ref 36.0–46.0)
HEMOGLOBIN: 8.9 g/dL — AB (ref 12.0–15.0)
LYMPHS PCT: 19 %
Lymphs Abs: 1.2 10*3/uL (ref 0.7–4.0)
MCH: 29.3 pg (ref 26.0–34.0)
MCHC: 31.3 g/dL (ref 30.0–36.0)
MCV: 93.4 fL (ref 78.0–100.0)
MONOS PCT: 6 %
Monocytes Absolute: 0.4 10*3/uL (ref 0.1–1.0)
NEUTROS ABS: 4.6 10*3/uL (ref 1.7–7.7)
Neutrophils Relative %: 75 %
Platelets: 192 10*3/uL (ref 150–400)
RBC: 3.04 MIL/uL — ABNORMAL LOW (ref 3.87–5.11)
RDW: 23.6 % — AB (ref 11.5–15.5)
WBC: 6.2 10*3/uL (ref 4.0–10.5)

## 2016-03-05 LAB — PREPARE RBC (CROSSMATCH)

## 2016-03-05 MED ORDER — SODIUM CHLORIDE 0.9 % IV SOLN
Freq: Once | INTRAVENOUS | Status: AC
  Administered 2016-03-05: 10:00:00 via INTRAVENOUS

## 2016-03-05 MED ORDER — HEPARIN SOD (PORK) LOCK FLUSH 100 UNIT/ML IV SOLN
500.0000 [IU] | Freq: Once | INTRAVENOUS | Status: AC | PRN
  Administered 2016-03-05: 500 [IU]
  Filled 2016-03-05 (×2): qty 5

## 2016-03-05 MED ORDER — PACLITAXEL PROTEIN-BOUND CHEMO INJECTION 100 MG
125.0000 mg/m2 | Freq: Once | INTRAVENOUS | Status: AC
  Administered 2016-03-05: 200 mg via INTRAVENOUS
  Filled 2016-03-05: qty 40

## 2016-03-05 MED ORDER — HYDROCODONE-ACETAMINOPHEN 5-325 MG PO TABS
ORAL_TABLET | ORAL | Status: AC
  Filled 2016-03-05: qty 1

## 2016-03-05 MED ORDER — ACETAMINOPHEN 325 MG PO TABS
650.0000 mg | ORAL_TABLET | Freq: Once | ORAL | Status: AC
  Administered 2016-03-05: 650 mg via ORAL
  Filled 2016-03-05: qty 2

## 2016-03-05 MED ORDER — PROCHLORPERAZINE MALEATE 10 MG PO TABS
10.0000 mg | ORAL_TABLET | Freq: Once | ORAL | Status: AC
  Administered 2016-03-05: 10 mg via ORAL
  Filled 2016-03-05: qty 1

## 2016-03-05 MED ORDER — SODIUM CHLORIDE 0.9% FLUSH
10.0000 mL | INTRAVENOUS | Status: DC | PRN
  Administered 2016-03-05: 10 mL
  Filled 2016-03-05: qty 10

## 2016-03-05 MED ORDER — DIPHENHYDRAMINE HCL 25 MG PO CAPS
25.0000 mg | ORAL_CAPSULE | Freq: Once | ORAL | Status: AC
  Administered 2016-03-05: 25 mg via ORAL
  Filled 2016-03-05: qty 1

## 2016-03-05 MED ORDER — HYDROCODONE-ACETAMINOPHEN 5-325 MG PO TABS
1.0000 | ORAL_TABLET | Freq: Once | ORAL | Status: AC
  Administered 2016-03-05: 1 via ORAL

## 2016-03-05 MED ORDER — GEMCITABINE HCL CHEMO INJECTION 1 GM/26.3ML
1000.0000 mg/m2 | Freq: Once | INTRAVENOUS | Status: AC
  Administered 2016-03-05: 1634 mg via INTRAVENOUS
  Filled 2016-03-05: qty 21.04

## 2016-03-05 NOTE — Patient Instructions (Signed)
[facility] Discharge Instructions for Patients Receiving Chemotherapy   Beginning January 23rd 2017 lab work for the Cancer Center will be done in the  Main lab at [facility] on 1st floor. If you have a lab appointment with the Cancer Center please come in thru the  Main Entrance and check in at the main information desk   Today you received the following chemotherapy agents Abraxane and Gemzar as well as 1 unit of blood. Follow-up as scheduled. Call clinic for any questions or concerns  To help prevent nausea and vomiting after your treatment, we encourage you to take your nausea medication   If you develop nausea and vomiting, or diarrhea that is not controlled by your medication, call the clinic.  The clinic phone number is [phone]. Office hours are Monday-Friday 8:30am-5:00pm.  BELOW ARE SYMPTOMS THAT SHOULD BE REPORTED IMMEDIATELY:  *FEVER GREATER THAN 101.0 F  *CHILLS WITH OR WITHOUT FEVER  NAUSEA AND VOMITING THAT IS NOT CONTROLLED WITH YOUR NAUSEA MEDICATION  *UNUSUAL SHORTNESS OF BREATH  *UNUSUAL BRUISING OR BLEEDING  TENDERNESS IN MOUTH AND THROAT WITH OR WITHOUT PRESENCE OF ULCERS  *URINARY PROBLEMS  *BOWEL PROBLEMS  UNUSUAL RASH Items with * indicate a potential emergency and should be followed up as soon as possible. If you have an emergency after office hours please contact your primary care physician or go to the nearest emergency department.  Please call the clinic during office hours if you have any questions or concerns.   You may also contact the Patient Navigator at [phone] should you have any questions or need assistance in obtaining follow up care.      Resources For Cancer Patients and their Caregivers ? American Cancer Society: Can assist with transportation, wigs, general needs, runs Look Good Feel Better.        1-888-227-6333 ? Cancer Care: Provides financial assistance, online support groups,  medication/co-pay assistance.  1-800-813-HOPE (4673) ? [facility] Assists [place] cancer patients and their families through emotional , educational and financial support.  336-427-4357 ? [place] DSS Where to apply for food stamps, Medicaid and utility assistance. 336-342-1394 ? [facility]: Transportation to medical appointments. 336-347-2287 ? Social Security Administration: May apply for disability if have a Stage IV cancer. 336-342-7796 1-800-772-1213 ? [place] [facility]ging, Disability and Transit Services: Assists with nutrition, care and transit needs. 336-349-2343         

## 2016-03-05 NOTE — Progress Notes (Signed)
0930 Pt c/o right hip pain rated a 10 that aches and is sharp at times. Spoke with Kirby Crigler PA-C and orders given for Hydrocodone 5-325 mg given to pt PO 1030 Pt reports pain is better and rated it a 5 which she states it stays at most of the time.Bergholz tolerated chemo tx and blood transfusion well without incident. Labs reviewed prior to administering chemotherapy. VSS upon discharge. Pt discharged via wheelchair in satisfactory condition

## 2016-03-06 DIAGNOSIS — I82409 Acute embolism and thrombosis of unspecified deep veins of unspecified lower extremity: Secondary | ICD-10-CM

## 2016-03-06 HISTORY — DX: Acute embolism and thrombosis of unspecified deep veins of unspecified lower extremity: I82.409

## 2016-03-06 LAB — TYPE AND SCREEN
Blood Product Expiration Date: 201802262359
ISSUE DATE / TIME: 201801311226
UNIT TYPE AND RH: 5100

## 2016-03-11 ENCOUNTER — Telehealth: Payer: Self-pay | Admitting: Orthopaedic Surgery

## 2016-03-11 MED ORDER — HYDROCODONE-ACETAMINOPHEN 5-325 MG PO TABS: 1.0000 | ORAL_TABLET | ORAL | 0 refills | Status: DC | PRN

## 2016-03-11 NOTE — Telephone Encounter (Signed)
Hydrocodone-Acetaminophen  5/325mg  Qty 56 Tablets °

## 2016-03-12 ENCOUNTER — Ambulatory Visit (HOSPITAL_COMMUNITY): Payer: Medicare HMO

## 2016-03-12 ENCOUNTER — Other Ambulatory Visit (HOSPITAL_COMMUNITY): Payer: Self-pay | Admitting: Oncology

## 2016-03-12 ENCOUNTER — Ambulatory Visit (HOSPITAL_COMMUNITY)
Admission: RE | Admit: 2016-03-12 | Discharge: 2016-03-12 | Disposition: A | Discharge status: Home / Self Care | Payer: Medicare HMO | Source: Ambulatory Visit | Attending: Oncology | Admitting: Oncology

## 2016-03-12 DIAGNOSIS — Q859 Phakomatosis, unspecified: Secondary | ICD-10-CM | POA: Diagnosis not present

## 2016-03-12 DIAGNOSIS — K7689 Other specified diseases of liver: Secondary | ICD-10-CM | POA: Insufficient documentation

## 2016-03-12 DIAGNOSIS — K5641 Fecal impaction: Secondary | ICD-10-CM | POA: Diagnosis not present

## 2016-03-12 DIAGNOSIS — C257 Malignant neoplasm of other parts of pancreas: Secondary | ICD-10-CM

## 2016-03-12 MED ORDER — GADOBENATE DIMEGLUMINE 529 MG/ML IV SOLN
11.0000 mL | Freq: Once | INTRAVENOUS | Status: AC | PRN
  Administered 2016-03-12: 11 mL via INTRAVENOUS

## 2016-03-12 MED ORDER — SODIUM CHLORIDE 0.9% FLUSH
INTRAVENOUS | Status: AC
  Filled 2016-03-12: qty 200

## 2016-03-19 ENCOUNTER — Encounter (HOSPITAL_COMMUNITY): Payer: Medicare HMO

## 2016-03-24 ENCOUNTER — Emergency Department (HOSPITAL_COMMUNITY): Payer: Medicare HMO

## 2016-03-24 ENCOUNTER — Other Ambulatory Visit: Payer: Self-pay

## 2016-03-24 ENCOUNTER — Emergency Department (HOSPITAL_COMMUNITY)
Admission: EM | Admit: 2016-03-24 | Discharge: 2016-03-24 | Disposition: A | Discharge status: Home / Self Care | Payer: Medicare HMO | Attending: Emergency Medicine | Admitting: Emergency Medicine

## 2016-03-24 ENCOUNTER — Encounter (HOSPITAL_COMMUNITY): Payer: Self-pay

## 2016-03-24 DIAGNOSIS — Z87891 Personal history of nicotine dependence: Secondary | ICD-10-CM | POA: Diagnosis not present

## 2016-03-24 DIAGNOSIS — R0602 Shortness of breath: Secondary | ICD-10-CM | POA: Insufficient documentation

## 2016-03-24 DIAGNOSIS — Z79899 Other long term (current) drug therapy: Secondary | ICD-10-CM | POA: Insufficient documentation

## 2016-03-24 DIAGNOSIS — R071 Chest pain on breathing: Secondary | ICD-10-CM | POA: Diagnosis not present

## 2016-03-24 DIAGNOSIS — I10 Essential (primary) hypertension: Secondary | ICD-10-CM | POA: Insufficient documentation

## 2016-03-24 DIAGNOSIS — J441 Chronic obstructive pulmonary disease with (acute) exacerbation: Secondary | ICD-10-CM | POA: Diagnosis not present

## 2016-03-24 DIAGNOSIS — J45909 Unspecified asthma, uncomplicated: Secondary | ICD-10-CM | POA: Diagnosis not present

## 2016-03-24 DIAGNOSIS — I824Z1 Acute embolism and thrombosis of unspecified deep veins of right distal lower extremity: Secondary | ICD-10-CM | POA: Diagnosis not present

## 2016-03-24 DIAGNOSIS — M7989 Other specified soft tissue disorders: Secondary | ICD-10-CM | POA: Diagnosis present

## 2016-03-24 LAB — CBC WITH DIFFERENTIAL/PLATELET
Basophils Absolute: 0.1 10*3/uL (ref 0.0–0.1)
Basophils Relative: 1 %
EOS PCT: 0 %
Eosinophils Absolute: 0 10*3/uL (ref 0.0–0.7)
HEMATOCRIT: 36.5 % (ref 36.0–46.0)
HEMOGLOBIN: 11.7 g/dL — AB (ref 12.0–15.0)
LYMPHS ABS: 1.5 10*3/uL (ref 0.7–4.0)
LYMPHS PCT: 12 %
MCH: 30.5 pg (ref 26.0–34.0)
MCHC: 32.1 g/dL (ref 30.0–36.0)
MCV: 95.3 fL (ref 78.0–100.0)
MONO ABS: 1.9 10*3/uL — AB (ref 0.1–1.0)
Monocytes Relative: 15 %
Neutro Abs: 8.9 10*3/uL — ABNORMAL HIGH (ref 1.7–7.7)
Neutrophils Relative %: 72 %
Platelets: 372 10*3/uL (ref 150–400)
RBC: 3.83 MIL/uL — ABNORMAL LOW (ref 3.87–5.11)
RDW: 22 % — ABNORMAL HIGH (ref 11.5–15.5)
WBC: 12.4 10*3/uL — ABNORMAL HIGH (ref 4.0–10.5)

## 2016-03-24 LAB — I-STAT TROPONIN, ED: Troponin i, poc: 0 ng/mL (ref 0.00–0.08)

## 2016-03-24 LAB — BRAIN NATRIURETIC PEPTIDE: B Natriuretic Peptide: 394 pg/mL — ABNORMAL HIGH (ref 0.0–100.0)

## 2016-03-24 LAB — BASIC METABOLIC PANEL
Anion gap: 9 (ref 5–15)
BUN: 13 mg/dL (ref 6–20)
CHLORIDE: 101 mmol/L (ref 101–111)
CO2: 28 mmol/L (ref 22–32)
Calcium: 8.6 mg/dL — ABNORMAL LOW (ref 8.9–10.3)
Creatinine, Ser: 0.96 mg/dL (ref 0.44–1.00)
GFR calc Af Amer: >60 mL/min (ref >60)
GFR calc non Af Amer: 60 mL/min — ABNORMAL LOW (ref >60)
Glucose, Bld: 83 mg/dL (ref 65–99)
Potassium: 3 mmol/L — ABNORMAL LOW (ref 3.5–5.1)
Sodium: 138 mmol/L (ref 135–145)

## 2016-03-24 LAB — I-STAT CG4 LACTIC ACID, ED: Lactic Acid, Venous: 2.33 mmol/L (ref 0.5–1.9)

## 2016-03-24 MED ORDER — HYDROCODONE-ACETAMINOPHEN 5-325 MG PO TABS
1.0000 | ORAL_TABLET | Freq: Once | ORAL | Status: AC
  Administered 2016-03-24: 1 via ORAL
  Filled 2016-03-24: qty 1

## 2016-03-24 MED ORDER — RIVAROXABAN 15 MG PO TABS
ORAL_TABLET | ORAL | Status: AC
  Filled 2016-03-24: qty 1

## 2016-03-24 MED ORDER — IOPAMIDOL (ISOVUE-370) INJECTION 76%
100.0000 mL | Freq: Once | INTRAVENOUS | Status: AC | PRN
  Administered 2016-03-24: 100 mL via INTRAVENOUS

## 2016-03-24 MED ORDER — RIVAROXABAN (XARELTO) VTE STARTER PACK (15 & 20 MG): 15.0000 mg | ORAL_TABLET | Freq: Two times a day (BID) | ORAL | 0 refills | Status: DC

## 2016-03-24 MED ORDER — RIVAROXABAN 15 MG PO TABS
15.0000 mg | ORAL_TABLET | Freq: Once | ORAL | Status: AC
  Administered 2016-03-24: 15 mg via ORAL
  Filled 2016-03-24: qty 1

## 2016-03-24 MED ORDER — ALBUTEROL SULFATE (2.5 MG/3ML) 0.083% IN NEBU: 2.5000 mg | INHALATION_SOLUTION | Freq: Once | RESPIRATORY_TRACT | Status: DC

## 2016-03-24 MED ORDER — LEVOFLOXACIN 500 MG PO TABS: 750.0000 mg | ORAL_TABLET | Freq: Every day | ORAL | 0 refills | Status: DC

## 2016-03-24 MED ORDER — ALBUTEROL SULFATE (2.5 MG/3ML) 0.083% IN NEBU
5.0000 mg | INHALATION_SOLUTION | Freq: Once | RESPIRATORY_TRACT | Status: AC
  Administered 2016-03-24: 5 mg via RESPIRATORY_TRACT
  Filled 2016-03-24: qty 6

## 2016-03-24 NOTE — Discharge Instructions (Signed)
Please see your primary care doctor as scheduled on Wednesday for close follow-up and let them know about your ED visit. You have been restarted on her Xarelto. Please take this medication as prescribed.  The CT scan of your chest shows questionable pneumonia, for which you're given antibiotics. Please also follow up with your doctor about this.  Return without fail for worsening symptoms including fever, difficulty breathing, passing out, severe chest pain, or any other symptoms concerning to you.

## 2016-03-24 NOTE — ED Provider Notes (Signed)
[facility] DEPT Provider Note   CSN: [id number] Arrival date & time: 03/24/16  1717     History   Chief Complaint Chief Complaint  Patient presents with  . Leg Swelling    HPI Lindsay Salazar is a 66 y.o. female.  HPI  66 year old female who presents with right lower extremity swelling progressively worsening over the past 2-3 weeks. She has a history of COPD with chronic respiratory failure and when necessary home oxygen, pancreatic cancer on chemotherapy, and history of paroxysmal atrial fibrillation. She was discontinued on her home Xarelto in the setting of GI bleed one month ago.  States chronic shortness of breath especially with minimal exertion, that is unchanged from baseline. Denies any numbness or weakness in the lower extremities but endorses pain especially with ambulation at the bottom of her feet. Has not had orthopnea or PND that is new. Does report that over the past 2-3 days with deep inspiration has had pinprick chest pain that has been intermittent. No fevers or chills. Reports chronic stable cough. Taking home lasix, without improvement in swelling.  Past Medical History:  Diagnosis Date  . Anginal pain ([place])    thought she was having   heartburn  . Arthritis    bursitis , fibromyalgia  . Arthritis    "right hip" (11/27/2015)  . Asthma   . Atrial fibrillation ([place])   . Chronic bronchitis ([place])   . Chronic right hip pain   . COPD (chronic obstructive pulmonary disease) ([place])    wears home o2 prn  . Daily headache    "last 2-3 months" (11/27/2015)  . Exposure to TB    "mom had it when she was pregnant with me"  . Fibromyalgia   . GERD (gastroesophageal reflux disease)    use to have it but not now  . Heart murmur    12-  16 years old  . Hypertension   . On home oxygen therapy    11/27/2015 "whenever I need it; don't know how many liters"  . Pancreatic cancer ([place])   . Pneumonia    "several times" (11/27/2015)  . Pneumothorax     Patient Active  Problem List   Diagnosis Date Noted  . COPD exacerbation ([place]) 02/29/2016  . Rectal bleed 02/17/2016  . AKI (acute kidney injury) ([place]) 02/17/2016  . Chronic respiratory failure with hypoxia ([place]) 01/29/2016  . Malnutrition of moderate degree 01/29/2016  . Atrial fibrillation with rapid ventricular response ([place]) 01/28/2016  . Hypertension 01/28/2016  . Anxiety 01/28/2016  . Iron deficiency anemia 01/24/2016  . B12 deficiency 01/24/2016  . COPD with acute exacerbation ([place]) 01/20/2016  . Pancreatic cancer ([place]) 12/07/2015  . Epigastric pain   . Abdominal pain 11/27/2015  . Hypokalemia 11/27/2015  . Leukocytosis 11/27/2015  . Chronic anticoagulation-Xarelto 09/06/2015  . Gastrointestinal hemorrhage with melena 08/08/2015  . Acute blood loss anemia 08/08/2015  . Chronic obstructive pulmonary disease (COPD) ([place]) 08/08/2015  . Chest pain 08/08/2015  . Hyperglycemia 07/17/2015  . Anemia 07/17/2015  . Atrial fibrillation ([place]) 07/17/2015  . Hypertensive urgency 06/27/2015  . Tobacco abuse 06/27/2015  . Elevated troponin 06/27/2015  . CAP (community acquired pneumonia)   . OSTEOPOROSIS 04/10/2009  . WEIGHT LOSS 04/10/2009    Past Surgical History:  Procedure Laterality Date  . ANTERIOR CERVICAL DECOMP/DISCECTOMY FUSION     "put 4 screws in"  . BACK SURGERY    . CARDIAC CATHETERIZATION  08/2004   Archie Endo 06/18/2010  . CARDIOVERSION N/A 07/18/2015  Procedure: CARDIOVERSION;  Surgeon: Josue Hector, MD;  Location: [facility]ITE;  Service: Cardiovascular;  Laterality: N/A;  . ESOPHAGOGASTRODUODENOSCOPY N/A 08/09/2015   Procedure: ESOPHAGOGASTRODUODENOSCOPY (EGD);  Surgeon: Rogene [place], MD;  Location: [facility] END[facility] SUITE;  Service: Endoscopy;  Laterality: N/A;  . EUS N/A 11/29/2015   Procedure: UPPER ENDOSCOPIC ULTRASOUND (EUS) RADIAL;  Surgeon: Milus Banister, MD;  Location: [facility] ENDOS[facility];  Service: Endoscopy;  Laterality: N/A;  . IR GENERIC HISTORICAL  12/18/2015   IR US GUIDE VASC  ACCESS RIGHT 12/18/2015 Sandi Mariscal, MD [facility]  . IR GENERIC HISTORICAL  12/18/2015   IR FLUORO GUIDE PORT INSERTION RIGHT 12/18/2015 Sandi Mariscal, MD [facility]  . TONSILLECTOMY    . TUBAL LIGATION      OB History    Gravida Para Term Preterm AB Living   2         2   SAB TAB Ectopic Multiple Live Births                   Home Medications    Prior to Admission medications   Medication Sig Start Date End Date Taking? Authorizing Provider  COMBIVENT RESPIMAT 20-100 MCG/ACT AERS respimat Inhale 1 puff into the lungs 2 (two) times daily. 12/03/15  Yes Ripudeep Krystal Eaton, MD  diltiazem (CARDIZEM CD) 360 MG 24 hr capsule Take 1 capsule (360 mg total) by mouth daily. 02/06/16  Yes Kathie Dike, MD  docusate sodium (COLACE) 100 MG capsule Take 1 capsule (100 mg total) by mouth 2 (two) times daily. 02/19/16  Yes Thurnell Lose, MD  escitalopram (LEXAPRO) 10 MG tablet Take 1 tablet (10 mg total) by mouth every evening. 12/03/15  Yes Ripudeep Krystal Eaton, MD  Gemcitabine HCl (GEMZAR IV) Inject into the vein. Day 1, day 8, day 15, every 28 days   Yes Historical Provider, MD  guaiFENesin (MUCINEX) 600 MG 12 hr tablet Take 1 tablet (600 mg total) by mouth 2 (two) times daily. 03/02/16  Yes Kathie Dike, MD  HYDROcodone-acetaminophen (NORCO/VICODIN) 5-325 MG tablet Take 1 tablet by mouth every 4 (four) hours as needed for moderate pain (Must last 14 days.Do not take and drive a car or use machinery.). 03/11/16  Yes Sanjuana Kava, MD  ipratropium-albuterol (DUONEB) 0.5-2.5 (3) MG/3ML SOLN Inhale 3 mLs into the lungs every 6 (six) hours as needed (for shortness of breath).  03/03/16  Yes Historical Provider, MD  metoprolol tartrate (LOPRESSOR) 50 MG tablet Take 1 tablet (50 mg total) by mouth 2 (two) times daily. 03/02/16  Yes Kathie Dike, MD  ondansetron (ZOFRAN) 8 MG tablet Take 1 tablet by mouth daily as needed for nausea/vomiting. 12/10/15  Yes Historical Provider, MD  OXYGEN Inhale 2 L into the  lungs daily as needed (for breathing).   Yes Historical Provider, MD  PACLitaxel Protein-Bound Part (ABRAXANE IV) Inject into the vein. Day 1, day 8, day 15, every 28 days   Yes Historical Provider, MD  pantoprazole (PROTONIX) 40 MG tablet Take 1 tablet (40 mg total) by mouth daily. 12/03/15  Yes Ripudeep Krystal Eaton, MD  potassium chloride SA (K-DUR,KLOR-CON) 20 MEQ tablet Take 2 tablets (40 mEq total) by mouth daily. 02/27/16  Yes Baird Cancer, PA-C  prochlorperazine (COMPAZINE) 10 MG tablet Take 1 tablet by mouth daily as needed for nausea/vomiting. 12/10/15  Yes Historical Provider, MD  SYMBICORT 160-4.5 MCG/ACT inhaler Inhale 1 puff into the lungs 2 (two) times daily. 12/03/15  Yes Ripudeep Krystal Eaton, MD  ipratropium (ATROVENT) 0.02 % nebulizer solution Take 2.5 mLs (0.5 mg total) by nebulization 3 (three) times daily. Patient not taking: Reported on 03/24/2016 01/23/16   Orvan Falconer, MD  levalbuterol Penne Lash) 1.25 MG/0.5ML nebulizer solution Take 1.25 mg by nebulization every 4 (four) hours as needed for wheezing or shortness of breath. Patient not taking: Reported on 03/24/2016 02/05/16   Kathie Dike, MD  levofloxacin (LEVAQUIN) 500 MG tablet Take 1.5 tablets (750 mg total) by mouth daily. 03/24/16   Forde Dandy, MD  levofloxacin (LEVAQUIN) 750 MG tablet Take 1 tablet (750 mg total) by mouth daily. Patient not taking: Reported on 03/24/2016 03/02/16   Kathie Dike, MD  nicotine (NICODERM CQ - DOSED IN MG/24 HOURS) 14 mg/24hr patch Place 1 patch (14 mg total) onto the skin daily. Patient not taking: Reported on 02/29/2016 12/03/15   Ripudeep Krystal Eaton, MD  predniSONE (DELTASONE) 10 MG tablet Take 40mg  po daily for 3 days then 30mg  daily for 3 days then 20mg  daily for 3 days then 10mg  daily for 3 days Patient not taking: Reported on 03/24/2016 03/02/16   Kathie Dike, MD  rivaroxaban (XARELTO) 20 MG TABS tablet Take 1 tablet (20 mg total) by mouth daily with supper. Patient not taking: Reported on 03/24/2016  07/19/15   Reyne Dumas, MD  Rivaroxaban 15 & 20 MG TBPK Take 15 mg by mouth 2 (two) times daily. Take as directed on package: Start with one 15mg  tablet by mouth twice a day with food. On Day 22, switch to one 20mg  tablet once a day with food. 03/24/16   Forde Dandy, MD    Family History Family History  Problem Relation Age of Onset  . COPD Mother   . Diabetes Father   . Stroke Father     Social History Social History  Substance Use Topics  . Smoking status: Former Smoker    Packs/day: 0.50    Years: 46.00    Types: Cigarettes    Start date: 10/09/1969    Quit date: 11/29/2015  . Smokeless tobacco: Never Used  . Alcohol use No     Comment: quit 30 years ago     Allergies   Ibuprofen; Other; Penicillins; Tiotropium bromide monohydrate; and Tramadol   Review of Systems Review of Systems 10/14 systems reviewed and are negative other than those stated in the HPI  Physical Exam Updated Vital Signs BP 139/63   Pulse 71   Temp 98.2 F (36.8 C) (Oral)   Resp 18   Ht 5\' 4"  (1.626 m)   Wt 171 lb (77.6 kg)   SpO2 97%   BMI 29.35 kg/m   Physical Exam Physical Exam  Nursing note and vitals reviewed. Constitutional: Chronically ill-appearing, non-toxic, and in no acute distress Head: Normocephalic and atraumatic.  Mouth/Throat: Oropharynx is clear and moist.  Neck: Normal range of motion. Neck supple.  Cardiovascular: Normal rate and regular rhythm.   +2 DP pulses bilaterally Pulmonary/Chest: Effort normal. No conversational dyspnea. Expiratory wheezing in all lung fields.  Abdominal: Soft. There is no tenderness. There is no rebound and no guarding.  Musculoskeletal: Normal range of motion.  nonpitting right lower extremity swelling.  Neurological: Alert, no facial droop, fluent speech, moves all extremities symmetrically, sensation to light touch intact throughout Skin: Skin is warm and dry.  Psychiatric: Cooperative   ED Treatments / Results  Labs (all labs  ordered are listed, but only abnormal results are displayed) Labs Reviewed  CBC WITH DIFFERENTIAL/PLATELET - Abnormal; Notable for  the following:       Result Value   WBC 12.4 (*)    RBC 3.83 (*)    Hemoglobin 11.7 (*)    RDW 22.0 (*)    Neutro Abs 8.9 (*)    Monocytes Absolute 1.9 (*)    All other components within normal limits  BASIC METABOLIC PANEL - Abnormal; Notable for the following:    Potassium 3.0 (*)    Calcium 8.6 (*)    GFR calc non Af Amer 60 (*)    All other components within normal limits  BRAIN NATRIURETIC PEPTIDE - Abnormal; Notable for the following:    B Natriuretic Peptide 394.0 (*)    All other components within normal limits  I-STAT CG4 LACTIC ACID, ED - Abnormal; Notable for the following:    Lactic Acid, Venous 2.33 (*)    All other components within normal limits  I-STAT TROPOININ, ED    EKG  EKG Interpretation None       Radiology Ct Angio Chest Pe W And/or Wo Contrast  Result Date: 03/24/2016 CLINICAL DATA:  Right lower extremity DVT. Shortness breath and chest pain. EXAM: CT ANGIOGRAPHY CHEST WITH CONTRAST TECHNIQUE: Multidetector CT imaging of the chest was performed using the standard protocol during bolus administration of intravenous contrast. Multiplanar CT image reconstructions and MIPs were obtained to evaluate the vascular anatomy. CONTRAST:  100 mL Isovue 370 COMPARISON:  Right lower extremity DVT study 03/24/2016. Two-view chest x-ray 02/29/2016. CT of the chest 12/24/2015. FINDINGS: Cardiovascular: The heart size is normal. No significant coronary artery disease present. There is no pericardial effusion. Main pulmonary arteries are of normal size. The study is mildly degraded by patient breathing motion. No focal filling defects are present to suggest pulmonary emboli. Mediastinum/Nodes: Atherosclerotic calcifications are present in the aorta without focal stenosis or aneurysm. The left vertebral artery originates from the aorta. Distal  esophageal dilation small hiatal hernia are present. Lungs/Pleura: Severe centrilobular emphysema is present. There is no focal nodule or mass lesion present. Ill-defined ground-glass attenuation is present in the upper lobes bilaterally, new since the prior exam Upper Abdomen: A 7 cm left hepatic cyst is stable. The spleen is within normal limits. Kidneys and adrenal glands are normal. Musculoskeletal: Bone windows demonstrate exaggerated kyphosis of the thoracic spine. No acute or healing fracture is present. There is no focal lytic or blastic lesion. Cervical spine fusion is again noted. Review of the MIP images confirms the above findings. IMPRESSION: 1. No evidence for pulmonary embolus. 2. Severe centrilobular emphysema. 3. Ill-defined ground-glass attenuation in the upper lobes bilaterally likely representing edema or early infection. Atelectasis could have a similar appearance. 4. Stable hepatic cyst. 5. Atherosclerosis without aneurysm or focal stenosis of the great vessel origins. Electronically Signed   By: San Morelle M.D.   On: 03/24/2016 20:12   US Venous Img Lower Unilateral Right  Result Date: 03/24/2016 CLINICAL DATA:  Right lower extremity edema and pains for 2-3 weeks with shortness of breath EXAM: Right LOWER EXTREMITY VENOUS DOPPLER ULTRASOUND TECHNIQUE: Gray-scale sonography with graded compression, as well as color Doppler and duplex ultrasound were performed to evaluate the lower extremity deep venous systems from the level of the common femoral vein and including the common femoral, femoral, profunda femoral, popliteal and calf veins including the posterior tibial, peroneal and gastrocnemius veins when visible. The superficial great saphenous vein was also interrogated. Spectral Doppler was utilized to evaluate flow at rest and with distal augmentation maneuvers in the common femoral,  femoral and popliteal veins. COMPARISON:  None. FINDINGS: Contralateral Common Femoral Vein:  Respiratory phasicity is normal and symmetric with the symptomatic side. No evidence of thrombus. Normal compressibility. Common Femoral Vein: No evidence of thrombus. Normal compressibility, respiratory phasicity and response to augmentation. Saphenofemoral Junction: No evidence of thrombus. Normal compressibility and flow on color Doppler imaging. Profunda Femoral Vein: No evidence of thrombus. Normal compressibility and flow on color Doppler imaging. Femoral Vein: Nonocclusive thrombus within the mid right femoral vein. Vein is noncompressible at the mid segment. Popliteal Vein: No evidence of thrombus. Normal compressibility, respiratory phasicity and response to augmentation. Calf Veins: Occlusive thrombus present within the peroneal and posterior tibial veins. Veins are noncompressible. Anterior tibial vein appears grossly patent. Superficial Great Saphenous Vein: No evidence of thrombus. Normal compressibility and flow on color Doppler imaging. Venous Reflux:  None. Other Findings:  None. IMPRESSION: Positive for nonocclusive acute DVT involving the mid right femoral vein. Occlusive below the knee thrombus involving the posterior tibial and peroneal veins. Critical Value/emergent results were called by telephone at the time of interpretation on 03/24/2016 at 6:48 pm to Dr. Brantley Stage , who verbally acknowledged these results. Electronically Signed   By: Donavan Foil M.D.   On: 03/24/2016 18:48    Procedures Procedures (including critical care time)  Medications Ordered in ED Medications  Rivaroxaban (XARELTO) tablet 15 mg (not administered)  HYDROcodone-acetaminophen (NORCO/VICODIN) 5-325 MG per tablet 1 tablet (not administered)  albuterol (PROVENTIL) (2.5 MG/3ML) 0.083% nebulizer solution 5 mg (5 mg Nebulization Given 03/24/16 1753)  iopamidol (ISOVUE-370) 76 % injection 100 mL (100 mLs Intravenous Contrast Given 03/24/16 1931)     Initial Impression / Assessment and Plan / ED Course  I have  reviewed the triage vital signs and the nursing notes.  Pertinent labs & imaging results that were available during my care of the patient were reviewed by me and considered in my medical decision making (see chart for details).      66 year old female who has a history of atrial fibrillation no longer on Xarelto and a creatinine cancer on chemotherapy who presents with 2 weeks of right lower extremity swelling. Extremity is neurovascularly intact. No evidence of cellulitis. Concern for DVT given her cancer history and now being off of her anticoagulation. Ultrasound does reveal a nonocclusive DVT in the right femoral vein, and further clots that are occlusive below the knee. I have reviewed her chart from when she says that she was discontinued on her Xarelto. She did have some bleeding, which was felt to be hemorrhoidal by GI during her admission. GI had recommended that she continue her Xarelto. It was also recently continued after her hospitalization for COPD at the end of January. I do not feel that at this time there is any contraindications to her Xarelto, and I've represcribed her this medication.  Her CT PE does not show evidence of an acute PE. They questioned some early infection and she does complain of productive cough with subjective fevers and chills. No fever here on stable vital signs. Minimal leukocytosis of 11. We'll start outpatient antibiotics for her. Cardiac work-up otherwise unremarkable. Her chest pain is atypical for ACS (and initially more c/f PE) and CT shows no other acute pulmonary processes or intrathoracic processes. No chest pain in ED  She has follow-up with her oncologist in 3 days which I think is appropriate for her. Strict return and follow-up instructions reviewed. She expressed understanding of all discharge instructions and felt comfortable with the  plan of care.   Final Clinical Impressions(s) / ED Diagnoses   Final diagnoses:  Acute deep vein thrombosis  (DVT) of distal end of right lower extremity (HCC)    New Prescriptions New Prescriptions   LEVOFLOXACIN (LEVAQUIN) 500 MG TABLET    Take 1.5 tablets (750 mg total) by mouth daily.   RIVAROXABAN 15 & 20 MG TBPK    Take 15 mg by mouth 2 (two) times daily. Take as directed on package: Start with one 15mg  tablet by mouth twice a day with food. On Day 22, switch to one 20mg  tablet once a day with food.     Forde Dandy, MD 03/24/16 2040

## 2016-03-24 NOTE — ED Notes (Signed)
Pt to radiology.

## 2016-03-24 NOTE — ED Notes (Signed)
Pt transported to [place] and CT

## 2016-03-24 NOTE — ED Notes (Signed)
Pt back from Radiology. 

## 2016-03-24 NOTE — ED Triage Notes (Signed)
Pt brought in for evaluation of swelling of right leg. Pt reports that leg is painful. Pt reports pain from knee to lower foot

## 2016-03-25 ENCOUNTER — Other Ambulatory Visit (HOSPITAL_COMMUNITY): Payer: Self-pay | Admitting: Emergency Medicine

## 2016-03-25 ENCOUNTER — Telehealth (HOSPITAL_COMMUNITY): Payer: Self-pay | Admitting: Emergency Medicine

## 2016-03-25 NOTE — Telephone Encounter (Signed)
Pt called to verify appt time.  03/26/2016 at 9:30.   Called Col[place] [place] to get her to fax Dr Marlowe Aschoff note.  Given to tom.  Continue Chemo while we wait on pt to have clearance for surgery.

## 2016-03-26 ENCOUNTER — Ambulatory Visit (HOSPITAL_COMMUNITY): Payer: Medicare HMO

## 2016-03-26 ENCOUNTER — Encounter (HOSPITAL_COMMUNITY): Payer: Medicare HMO | Attending: Oncology

## 2016-03-26 ENCOUNTER — Encounter (HOSPITAL_COMMUNITY): Payer: Self-pay

## 2016-03-26 ENCOUNTER — Other Ambulatory Visit (HOSPITAL_COMMUNITY): Payer: Self-pay | Admitting: Oncology

## 2016-03-26 VITALS — BP 102/89 | HR 64 | Temp 98.2°F | Resp 18 | Wt 120.4 lb

## 2016-03-26 DIAGNOSIS — Z5111 Encounter for antineoplastic chemotherapy: Secondary | ICD-10-CM

## 2016-03-26 DIAGNOSIS — R197 Diarrhea, unspecified: Secondary | ICD-10-CM

## 2016-03-26 DIAGNOSIS — R0602 Shortness of breath: Secondary | ICD-10-CM

## 2016-03-26 DIAGNOSIS — C257 Malignant neoplasm of other parts of pancreas: Secondary | ICD-10-CM | POA: Diagnosis not present

## 2016-03-26 DIAGNOSIS — C251 Malignant neoplasm of body of pancreas: Secondary | ICD-10-CM

## 2016-03-26 LAB — CBC WITH DIFFERENTIAL/PLATELET
BASOS PCT: 0 %
Basophils Absolute: 0 10*3/uL (ref 0.0–0.1)
EOS PCT: 0 %
Eosinophils Absolute: 0 10*3/uL (ref 0.0–0.7)
HEMATOCRIT: 39.5 % (ref 36.0–46.0)
HEMOGLOBIN: 12.3 g/dL (ref 12.0–15.0)
LYMPHS PCT: 14 %
Lymphs Abs: 2.1 10*3/uL (ref 0.7–4.0)
MCH: 29.6 pg (ref 26.0–34.0)
MCHC: 31.1 g/dL (ref 30.0–36.0)
MCV: 95.2 fL (ref 78.0–100.0)
MONO ABS: 1.8 10*3/uL — AB (ref 0.1–1.0)
MONOS PCT: 12 %
NEUTROS PCT: 74 %
Neutro Abs: 10.8 10*3/uL — ABNORMAL HIGH (ref 1.7–7.7)
PLATELETS: 406 10*3/uL — AB (ref 150–400)
RBC: 4.15 MIL/uL (ref 3.87–5.11)
RDW: 21.7 % — ABNORMAL HIGH (ref 11.5–15.5)
WBC: 14.7 10*3/uL — AB (ref 4.0–10.5)

## 2016-03-26 LAB — COMPREHENSIVE METABOLIC PANEL
ALT: 22 U/L (ref 14–54)
ANION GAP: 13 (ref 5–15)
AST: 26 U/L (ref 15–41)
Albumin: 3.1 g/dL — ABNORMAL LOW (ref 3.5–5.0)
Alkaline Phosphatase: 74 U/L (ref 38–126)
BUN: 13 mg/dL (ref 6–20)
CHLORIDE: 93 mmol/L — AB (ref 101–111)
CO2: 29 mmol/L (ref 22–32)
CREATININE: 1.18 mg/dL — AB (ref 0.44–1.00)
Calcium: 7.9 mg/dL — ABNORMAL LOW (ref 8.9–10.3)
GFR, EST AFRICAN AMERICAN: 54 mL/min — AB (ref >60)
GFR, EST NON AFRICAN AMERICAN: 47 mL/min — AB (ref >60)
Glucose, Bld: 174 mg/dL — ABNORMAL HIGH (ref 65–99)
Potassium: 2.4 mmol/L — CL (ref 3.5–5.1)
SODIUM: 135 mmol/L (ref 135–145)
Total Bilirubin: 0.6 mg/dL (ref 0.3–1.2)
Total Protein: 5.9 g/dL — ABNORMAL LOW (ref 6.5–8.1)

## 2016-03-26 MED ORDER — POTASSIUM CHLORIDE 10 MEQ/100ML IV SOLN
10.0000 meq | INTRAVENOUS | Status: AC
  Administered 2016-03-26 (×4): 10 meq via INTRAVENOUS
  Filled 2016-03-26 (×4): qty 100

## 2016-03-26 MED ORDER — POTASSIUM CHLORIDE CRYS ER 20 MEQ PO TBCR
60.0000 meq | EXTENDED_RELEASE_TABLET | Freq: Two times a day (BID) | ORAL | Status: DC
  Administered 2016-03-26: 60 meq via ORAL
  Filled 2016-03-26: qty 3

## 2016-03-26 MED ORDER — IPRATROPIUM-ALBUTEROL 0.5-2.5 (3) MG/3ML IN SOLN
3.0000 mL | Freq: Four times a day (QID) | RESPIRATORY_TRACT | Status: DC
  Administered 2016-03-26 (×2): 3 mL via RESPIRATORY_TRACT
  Filled 2016-03-26: qty 3

## 2016-03-26 MED ORDER — HYDROCODONE-ACETAMINOPHEN 5-325 MG PO TABS
ORAL_TABLET | ORAL | Status: AC
  Filled 2016-03-26: qty 1

## 2016-03-26 MED ORDER — IPRATROPIUM-ALBUTEROL 0.5-2.5 (3) MG/3ML IN SOLN
RESPIRATORY_TRACT | Status: AC
  Filled 2016-03-26: qty 3

## 2016-03-26 MED ORDER — SODIUM CHLORIDE 0.9% FLUSH: 10.0000 mL | INTRAVENOUS | Status: DC | PRN

## 2016-03-26 MED ORDER — PROCHLORPERAZINE MALEATE 10 MG PO TABS
10.0000 mg | ORAL_TABLET | Freq: Once | ORAL | Status: AC
  Administered 2016-03-26: 10 mg via ORAL
  Filled 2016-03-26: qty 1

## 2016-03-26 MED ORDER — SODIUM CHLORIDE 0.9 % IV SOLN
1596.0000 mg | Freq: Once | INTRAVENOUS | Status: AC
  Administered 2016-03-26: 1596 mg via INTRAVENOUS
  Filled 2016-03-26: qty 26.22

## 2016-03-26 MED ORDER — HYDROCODONE-ACETAMINOPHEN 5-325 MG PO TABS
1.0000 | ORAL_TABLET | Freq: Once | ORAL | Status: AC
  Administered 2016-03-26: 1 via ORAL

## 2016-03-26 MED ORDER — HEPARIN SOD (PORK) LOCK FLUSH 100 UNIT/ML IV SOLN
500.0000 [IU] | Freq: Once | INTRAVENOUS | Status: AC | PRN
  Administered 2016-03-26: 500 [IU]
  Filled 2016-03-26 (×2): qty 5

## 2016-03-26 MED ORDER — DIPHENOXYLATE-ATROPINE 2.5-0.025 MG PO TABS
2.0000 | ORAL_TABLET | Freq: Once | ORAL | Status: AC
  Administered 2016-03-26: 2 via ORAL
  Filled 2016-03-26: qty 2

## 2016-03-26 MED ORDER — SODIUM CHLORIDE 0.9 % IV SOLN
Freq: Once | INTRAVENOUS | Status: AC
  Administered 2016-03-26: 11:00:00 via INTRAVENOUS

## 2016-03-26 MED ORDER — PACLITAXEL PROTEIN-BOUND CHEMO INJECTION 100 MG
125.0000 mg/m2 | Freq: Once | INTRAVENOUS | Status: AC
  Administered 2016-03-26: 200 mg via INTRAVENOUS
  Filled 2016-03-26: qty 40

## 2016-03-26 NOTE — Patient Instructions (Signed)
[facility] Discharge Instructions for Patients Receiving Chemotherapy   Beginning January 23rd 2017 lab work for the Cancer Center will be done in the  Main lab at [facility] on 1st floor. If you have a lab appointment with the Cancer Center please come in thru the  Main Entrance and check in at the main information desk   Today you received the following chemotherapy agents   To help prevent nausea and vomiting after your treatment, we encourage you to take your nausea medication     If you develop nausea and vomiting, or diarrhea that is not controlled by your medication, call the clinic.  The clinic phone number is [phone]. Office hours are Monday-Friday 8:30am-5:00pm.  BELOW ARE SYMPTOMS THAT SHOULD BE REPORTED IMMEDIATELY:  *FEVER GREATER THAN 101.0 F  *CHILLS WITH OR WITHOUT FEVER  NAUSEA AND VOMITING THAT IS NOT CONTROLLED WITH YOUR NAUSEA MEDICATION  *UNUSUAL SHORTNESS OF BREATH  *UNUSUAL BRUISING OR BLEEDING  TENDERNESS IN MOUTH AND THROAT WITH OR WITHOUT PRESENCE OF ULCERS  *URINARY PROBLEMS  *BOWEL PROBLEMS  UNUSUAL RASH Items with * indicate a potential emergency and should be followed up as soon as possible. If you have an emergency after office hours please contact your primary care physician or go to the nearest emergency department.  Please call the clinic during office hours if you have any questions or concerns.   You may also contact the Patient Navigator at [phone] should you have any questions or need assistance in obtaining follow up care.      Resources For Cancer Patients and their Caregivers ? American Cancer Society: Can assist with transportation, wigs, general needs, runs Look Good Feel Better.        1-888-227-6333 ? Cancer Care: Provides financial assistance, online support groups, medication/co-pay assistance.  1-800-813-HOPE (4673) ? [facility] Assists [place] cancer  patients and their families through emotional , educational and financial support.  336-427-4357 ? [place] DSS Where to apply for food stamps, Medicaid and utility assistance. 336-342-1394 ? [facility]: Transportation to medical appointments. 336-347-2287 ? Social Security Administration: May apply for disability if have a Stage IV cancer. 336-342-7796 1-800-772-1213 ? [place] Aging, Disability and Transit Services: Assists with nutrition, care and transit needs. 336-349-2343         

## 2016-03-26 NOTE — Progress Notes (Signed)
CRITICAL VALUE ALERT Critical value received:  Potassium - 2.4 Date of notification:  03/26/16 Time of notification: 1011 Critical value read back:  Yes.   Nurse who received alert:  M.Alease Fait, LPN  MD notified (1st page):  T.Kefalas, PA-C

## 2016-03-26 NOTE — Progress Notes (Signed)
Chemotherapy given today per orders. Patient tolerated it well. Patient was given money to fill her Levaquin script with at [facility]. Diarrhea sheet given and explained to patient. Vitals stable and discharged via wheelchair home from clinic. Follow up as scheduled.

## 2016-03-27 LAB — CANCER ANTIGEN 19-9: CA 19-9: 33 U/mL (ref 0–35)

## 2016-03-27 NOTE — Progress Notes (Signed)
Cardiology Office Note   Date:  03/28/2016   ID:  Lindsay Salazar, DOB 08/21/1950, MRN [id number]  PCP:  [name], PA-C  Cardiologist: Cloria Spring, NP   No chief complaint on file.    History of Present Illness: Lindsay Salazar is a 66 y.o. female who presents for ongoing assessment and management of atrial fib, with history of hypertension, COPD, and pancreatic cancer. She was seen on consultation during hospitalization. After discharge she was again seen in the ER for complaints of leg swelling. She was diagnosed with DVT. She was started on Xarelto and treated for respiratory infection with levofloxacin.   She has a day feeling worse. Unable to walk without significant fatigue racing heart rate and dyspnea. We have placed her in a wheelchair here in our office visit will make it easier for her to be seen in our clinic. She complains of worsening breathing status and weakness in her legs, fatigue.  CT scan 03/14/2016 IMPRESSION: 1. No evidence for pulmonary embolus. 2. Severe centrilobular emphysema. 3. Ill-defined ground-glass attenuation in the upper lobes bilaterally likely representing edema or early infection. Atelectasis could have a similar appearance. 4. Stable hepatic cyst. 5. Atherosclerosis without aneurysm or focal stenosis of the great vessel origins.  The patient has stopped smoking, but lives with a boyfriend who smokes heavily. Her boyfriend also uses her nebulizer treatments as he has not been able to be seen by his own primary care physician. The patient also lives was a lot of dogs which can be exacerbating her symptoms. She is supposed to be on home O2, but does not use it regularly. She states that when she gets up to try and walk she becomes very weak and her heart begins to race.   Past Medical History:  Diagnosis Date  . Anginal pain ([place])    thought she was having   heartburn  . Arthritis    bursitis , fibromyalgia  . Arthritis    "right hip"  (11/27/2015)  . Asthma   . Atrial fibrillation ([place])   . Chronic bronchitis ([place])   . Chronic right hip pain   . COPD (chronic obstructive pulmonary disease) ([place])    wears home o2 prn  . Daily headache    "last 2-3 months" (11/27/2015)  . Exposure to TB    "mom had it when she was pregnant with me"  . Fibromyalgia   . GERD (gastroesophageal reflux disease)    use to have it but not now  . Heart murmur    81-  34 years old  . Hypertension   . On home oxygen therapy    11/27/2015 "whenever I need it; don't know how many liters"  . Pancreatic cancer ([place])   . Pneumonia    "several times" (11/27/2015)  . Pneumothorax     Past Surgical History:  Procedure Laterality Date  . ANTERIOR CERVICAL DECOMP/DISCECTOMY FUSION     "put 4 screws in"  . BACK SURGERY    . CARDIAC CATHETERIZATION  08/2004   Archie Endo 06/18/2010  . CARDIOVERSION N/A 07/18/2015   Procedure: CARDIOVERSION;  Surgeon: Josue Hector, MD;  Location: [facility];  Service: Cardiovascular;  Laterality: N/A;  . ESOPHAGOGASTRODUODENOSCOPY N/A 08/09/2015   Procedure: ESOPHAGOGASTRODUODENOSCOPY (EGD);  Surgeon: Rogene Houston, MD;  Location: [facility];  Service: Endoscopy;  Laterality: N/A;  . EUS N/A 11/29/2015   Procedure: UPPER ENDOSCOPIC ULTRASOUND (EUS) RADIAL;  Surgeon: Milus Banister, MD;  Location: [facility];  Service: Endoscopy;  Laterality: N/A;  . IR GENERIC HISTORICAL  12/18/2015   IR US GUIDE VASC ACCESS RIGHT 12/18/2015 Sandi Mariscal, MD [facility]  . IR GENERIC HISTORICAL  12/18/2015   IR FLUORO GUIDE PORT INSERTION RIGHT 12/18/2015 Sandi Mariscal, MD [facility]  . TONSILLECTOMY    . TUBAL LIGATION       Current Outpatient Prescriptions  Medication Sig Dispense Refill  . COMBIVENT RESPIMAT 20-100 MCG/ACT AERS respimat Inhale 1 puff into the lungs 2 (two) times daily. 1 Inhaler 5  . diltiazem (CARDIZEM CD) 360 MG 24 hr capsule Take 1 capsule (360 mg total) by mouth daily. 60 capsule 1  .  escitalopram (LEXAPRO) 10 MG tablet Take 1 tablet (10 mg total) by mouth every evening. 30 tablet 0  . Gemcitabine HCl (GEMZAR IV) Inject into the vein. Day 1, day 8, day 15, every 28 days    . guaiFENesin (MUCINEX) 600 MG 12 hr tablet Take 1 tablet (600 mg total) by mouth 2 (two) times daily. 30 tablet 0  . HYDROcodone-acetaminophen (NORCO/VICODIN) 5-325 MG tablet Take 1 tablet by mouth every 4 (four) hours as needed for moderate pain (Must last 14 days.Do not take and drive a car or use machinery.). 50 tablet 0  . ipratropium (ATROVENT) 0.02 % nebulizer solution Take 2.5 mLs (0.5 mg total) by nebulization 3 (three) times daily. 75 mL 12  . ipratropium-albuterol (DUONEB) 0.5-2.5 (3) MG/3ML SOLN Inhale 3 mLs into the lungs every 6 (six) hours as needed (for shortness of breath).     Marland Kitchen levalbuterol (XOPENEX) 1.25 MG/0.5ML nebulizer solution Take 1.25 mg by nebulization every 4 (four) hours as needed for wheezing or shortness of breath. 1 each 12  . levofloxacin (LEVAQUIN) 500 MG tablet Take 1.5 tablets (750 mg total) by mouth daily. 10 tablet 0  . levofloxacin (LEVAQUIN) 750 MG tablet Take 1 tablet (750 mg total) by mouth daily. 4 tablet 0  . metoprolol tartrate (LOPRESSOR) 50 MG tablet Take 1 tablet (50 mg total) by mouth 2 (two) times daily. 60 tablet 0  . nicotine (NICODERM CQ - DOSED IN MG/24 HOURS) 14 mg/24hr patch Place 1 patch (14 mg total) onto the skin daily. 28 patch 3  . ondansetron (ZOFRAN) 8 MG tablet Take 1 tablet by mouth daily as needed for nausea/vomiting.    . OXYGEN Inhale 2 L into the lungs daily as needed (for breathing).    Marland Kitchen PACLitaxel Protein-Bound Part (ABRAXANE IV) Inject into the vein. Day 1, day 8, day 15, every 28 days    . pantoprazole (PROTONIX) 40 MG tablet Take 1 tablet (40 mg total) by mouth daily. 60 tablet 3  . potassium chloride SA (K-DUR,KLOR-CON) 20 MEQ tablet Take 2 tablets (40 mEq total) by mouth daily. 60 tablet 0  . prochlorperazine (COMPAZINE) 10 MG tablet  Take 1 tablet by mouth daily as needed for nausea/vomiting.    . rivaroxaban (XARELTO) 20 MG TABS tablet Take 1 tablet (20 mg total) by mouth daily with supper. 30 tablet 10  . Rivaroxaban 15 & 20 MG TBPK Take 15 mg by mouth 2 (two) times daily. Take as directed on package: Start with one 15mg  tablet by mouth twice a day with food. On Day 22, switch to one 20mg  tablet once a day with food. 51 each 0  . SYMBICORT 160-4.5 MCG/ACT inhaler Inhale 1 puff into the lungs 2 (two) times daily. 1 Inhaler 12   No current facility-administered medications for this visit.  Allergies:   Ibuprofen; Other; Penicillins; Tiotropium bromide monohydrate; and Tramadol    Social History:  The patient  reports that she quit smoking about 3 months ago. Her smoking use included Cigarettes. She started smoking about 46 years ago. She has a 23.00 pack-year smoking history. She has never used smokeless tobacco. She reports that she does not drink alcohol or use drugs.   Family History:  The patient's family history includes COPD in her mother; Diabetes in her father; Stroke in her father.    ROS: All other systems are reviewed and negative. Unless otherwise mentioned in H&P    PHYSICAL EXAM: VS:  BP 104/62   Pulse (!) 111   Ht 5\' 4"  (1.626 m)   Wt 125 lb 9.6 oz (57 kg)   SpO2 92%   BMI 21.56 kg/m  , BMI Body mass index is 21.56 kg/m.  GEN: Frail. HEENT: Alopecia is noted from chemotherapy. Neck: no JVD, carotid bruits, or masses Cardiac: IRRR tachycardic, ; no murmurs, rubs, or gallops,no edema  Respiratory: Inspiratory and expiratory wheezes, frequent coughing. Bilateral Rales. GI: soft, nontender, nondistended, + BS MS: no deformity or atrophy  Skin: warm and dry, no rash Neuro:  Strength and sensation are intact Psych: euthymic mood, full affect   Recent Labs: 06/27/2015: TSH 2.678 01/28/2016: Magnesium 2.9 03/24/2016: B Natriuretic Peptide 394.0 03/26/2016: ALT 22; BUN 13; Creatinine, Ser 1.18;  Hemoglobin 12.3; Platelets 406; Potassium 2.4; Sodium 135    Lipid Panel No results found for: CHOL, TRIG, HDL, CHOLHDL, VLDL, LDLCALC, LDLDIRECT    Wt Readings from Last 3 Encounters:  03/28/16 125 lb 9.6 oz (57 kg)  03/26/16 120 lb 6.4 oz (54.6 kg)  03/24/16 171 lb (77.6 kg)    Echocardiogram 06/28/2015.  Left ventricle: The cavity size was normal. Wall thickness was   normal. Systolic function was normal. The estimated ejection   fraction was 55%. There is akinesis of the midanteroseptal   myocardium. The study is not technically sufficient to allow   evaluation of LV diastolic function. - Aortic valve: Mildly calcified annulus. Trileaflet. There was   trivial regurgitation. - Mitral valve: Mildly thickened leaflets . There was mild   regurgitation. - Left atrium: The atrium was mildly dilated. - Right ventricle: Systolic function was low normal. - Right atrium: The atrium was mildly dilated. Central venous   pressure (est): 8 mm Hg. - Tricuspid valve: There was moderate regurgitation. - Pulmonary arteries: PA peak pressure: 41 mm Hg (S). - Pericardium, extracardiac: There was no pericardial effusion.   ASSESSMENT AND PLAN:  1. COPD exacerbation: Known history of severe emphysema. Recently seen in the emergency room and was found to have DVT, along with evidence of infection per CT scan. She remains on Xarelto. Was sent home on Levaquin, she has run out of her inhalers as her boyfriend also shares is with her. She does not smoke but lives with a boyfriend who smokes heavily. She has had worsening symptoms of dyspnea weakness and fatigue. She appears to have worsened since initially seen in the emergency room 4 days ago. She states that her status has declined significantly. I have called the ER physician, and left him know that we will be sending her over and she will likely need to be admitted for further treatment.  2. Hypertension: Blood pressure soft today. She is on  diltiazem 360 mg daily. We'll need to decrease this on admission. Last may indicate dehydration. Await evaluation during hospitalization. Medications will need to  be adjusted.  3. Pancreatic cancer: Followed by oncology. She has had chemotherapy. Surgery is planned but due to her current respiratory status is his been postponed.  4. Atrial fibrillation: Heart rate is not well controlled currently. Will not make any medication adjustments at this time. She is not complaining of any bleeding. Remains on Xarelto 20 mg daily.    Current medicines are reviewed at length with the patient today.    Labs/ tests ordered today include:  No orders of the defined types were placed in this encounter.    Disposition:   FU with post hospitalization.  Signed, Jory Sims, NP  03/28/2016 4:24 PM    [facility] [address], [place], [place] [address] Phone: [phone]; Fax: [phone]

## 2016-03-28 ENCOUNTER — Encounter: Payer: Self-pay | Admitting: Adult Health

## 2016-03-28 ENCOUNTER — Encounter (HOSPITAL_COMMUNITY): Payer: Self-pay

## 2016-03-28 ENCOUNTER — Emergency Department (HOSPITAL_COMMUNITY): Payer: Medicare HMO

## 2016-03-28 ENCOUNTER — Inpatient Hospital Stay (HOSPITAL_COMMUNITY)
Admission: EM | Admit: 2016-03-28 | Discharge: 2016-04-04 | DRG: 871 | Disposition: A | Discharge status: Home / Self Care | Payer: Medicare HMO | Attending: Internal Medicine | Admitting: Internal Medicine

## 2016-03-28 ENCOUNTER — Other Ambulatory Visit: Payer: Self-pay

## 2016-03-28 ENCOUNTER — Ambulatory Visit (INDEPENDENT_AMBULATORY_CARE_PROVIDER_SITE_OTHER): Payer: Medicare HMO | Admitting: Adult Health

## 2016-03-28 VITALS — BP 104/62 | HR 111 | Ht 64.0 in | Wt 125.6 lb

## 2016-03-28 DIAGNOSIS — M797 Fibromyalgia: Secondary | ICD-10-CM | POA: Diagnosis not present

## 2016-03-28 DIAGNOSIS — K219 Gastro-esophageal reflux disease without esophagitis: Secondary | ICD-10-CM | POA: Diagnosis present

## 2016-03-28 DIAGNOSIS — N390 Urinary tract infection, site not specified: Secondary | ICD-10-CM | POA: Diagnosis not present

## 2016-03-28 DIAGNOSIS — A419 Sepsis, unspecified organism: Secondary | ICD-10-CM | POA: Diagnosis not present

## 2016-03-28 DIAGNOSIS — E860 Dehydration: Secondary | ICD-10-CM | POA: Diagnosis not present

## 2016-03-28 DIAGNOSIS — Z9221 Personal history of antineoplastic chemotherapy: Secondary | ICD-10-CM

## 2016-03-28 DIAGNOSIS — F329 Major depressive disorder, single episode, unspecified: Secondary | ICD-10-CM | POA: Diagnosis not present

## 2016-03-28 DIAGNOSIS — Z825 Family history of asthma and other chronic lower respiratory diseases: Secondary | ICD-10-CM

## 2016-03-28 DIAGNOSIS — E872 Acidosis: Secondary | ICD-10-CM | POA: Diagnosis not present

## 2016-03-28 DIAGNOSIS — Z981 Arthrodesis status: Secondary | ICD-10-CM | POA: Diagnosis not present

## 2016-03-28 DIAGNOSIS — I482 Chronic atrial fibrillation: Secondary | ICD-10-CM | POA: Diagnosis present

## 2016-03-28 DIAGNOSIS — Z79899 Other long term (current) drug therapy: Secondary | ICD-10-CM

## 2016-03-28 DIAGNOSIS — D638 Anemia in other chronic diseases classified elsewhere: Secondary | ICD-10-CM | POA: Diagnosis present

## 2016-03-28 DIAGNOSIS — J441 Chronic obstructive pulmonary disease with (acute) exacerbation: Secondary | ICD-10-CM | POA: Diagnosis not present

## 2016-03-28 DIAGNOSIS — M810 Age-related osteoporosis without current pathological fracture: Secondary | ICD-10-CM | POA: Diagnosis present

## 2016-03-28 DIAGNOSIS — R0902 Hypoxemia: Secondary | ICD-10-CM

## 2016-03-28 DIAGNOSIS — I4891 Unspecified atrial fibrillation: Secondary | ICD-10-CM | POA: Diagnosis not present

## 2016-03-28 DIAGNOSIS — J181 Lobar pneumonia, unspecified organism: Secondary | ICD-10-CM

## 2016-03-28 DIAGNOSIS — E875 Hyperkalemia: Secondary | ICD-10-CM | POA: Diagnosis not present

## 2016-03-28 DIAGNOSIS — J9621 Acute and chronic respiratory failure with hypoxia: Secondary | ICD-10-CM | POA: Diagnosis not present

## 2016-03-28 DIAGNOSIS — E871 Hypo-osmolality and hyponatremia: Secondary | ICD-10-CM | POA: Diagnosis present

## 2016-03-28 DIAGNOSIS — R0602 Shortness of breath: Secondary | ICD-10-CM

## 2016-03-28 DIAGNOSIS — Z87891 Personal history of nicotine dependence: Secondary | ICD-10-CM

## 2016-03-28 DIAGNOSIS — C259 Malignant neoplasm of pancreas, unspecified: Secondary | ICD-10-CM | POA: Diagnosis present

## 2016-03-28 DIAGNOSIS — Z880 Allergy status to penicillin: Secondary | ICD-10-CM

## 2016-03-28 DIAGNOSIS — Z9981 Dependence on supplemental oxygen: Secondary | ICD-10-CM

## 2016-03-28 DIAGNOSIS — D72829 Elevated white blood cell count, unspecified: Secondary | ICD-10-CM | POA: Diagnosis present

## 2016-03-28 DIAGNOSIS — I10 Essential (primary) hypertension: Secondary | ICD-10-CM | POA: Diagnosis not present

## 2016-03-28 DIAGNOSIS — J440 Chronic obstructive pulmonary disease with acute lower respiratory infection: Secondary | ICD-10-CM | POA: Diagnosis not present

## 2016-03-28 DIAGNOSIS — Z7901 Long term (current) use of anticoagulants: Secondary | ICD-10-CM | POA: Diagnosis not present

## 2016-03-28 DIAGNOSIS — Z888 Allergy status to other drugs, medicaments and biological substances status: Secondary | ICD-10-CM

## 2016-03-28 DIAGNOSIS — J189 Pneumonia, unspecified organism: Secondary | ICD-10-CM | POA: Diagnosis present

## 2016-03-28 DIAGNOSIS — Y95 Nosocomial condition: Secondary | ICD-10-CM | POA: Diagnosis not present

## 2016-03-28 DIAGNOSIS — R001 Bradycardia, unspecified: Secondary | ICD-10-CM | POA: Diagnosis not present

## 2016-03-28 DIAGNOSIS — J410 Simple chronic bronchitis: Secondary | ICD-10-CM | POA: Diagnosis not present

## 2016-03-28 DIAGNOSIS — Z201 Contact with and (suspected) exposure to tuberculosis: Secondary | ICD-10-CM | POA: Diagnosis not present

## 2016-03-28 DIAGNOSIS — C250 Malignant neoplasm of head of pancreas: Secondary | ICD-10-CM

## 2016-03-28 DIAGNOSIS — Z7951 Long term (current) use of inhaled steroids: Secondary | ICD-10-CM | POA: Diagnosis not present

## 2016-03-28 DIAGNOSIS — Z886 Allergy status to analgesic agent status: Secondary | ICD-10-CM

## 2016-03-28 LAB — CBC WITH DIFFERENTIAL/PLATELET
BASOS ABS: 0 10*3/uL (ref 0.0–0.1)
BASOS PCT: 0 %
EOS PCT: 0 %
Eosinophils Absolute: 0 10*3/uL (ref 0.0–0.7)
HCT: 34.5 % — ABNORMAL LOW (ref 36.0–46.0)
Hemoglobin: 11 g/dL — ABNORMAL LOW (ref 12.0–15.0)
Lymphocytes Relative: 6 %
Lymphs Abs: 0.8 10*3/uL (ref 0.7–4.0)
MCH: 30.6 pg (ref 26.0–34.0)
MCHC: 31.9 g/dL (ref 30.0–36.0)
MCV: 96.1 fL (ref 78.0–100.0)
Monocytes Absolute: 0.2 10*3/uL (ref 0.1–1.0)
Monocytes Relative: 2 %
Neutro Abs: 12 10*3/uL — ABNORMAL HIGH (ref 1.7–7.7)
Neutrophils Relative %: 92 %
PLATELETS: 246 10*3/uL (ref 150–400)
RBC: 3.59 MIL/uL — ABNORMAL LOW (ref 3.87–5.11)
RDW: 21 % — AB (ref 11.5–15.5)
WBC: 13 10*3/uL — ABNORMAL HIGH (ref 4.0–10.5)

## 2016-03-28 LAB — URINALYSIS, ROUTINE W REFLEX MICROSCOPIC
Bilirubin Urine: NEGATIVE
GLUCOSE, UA: NEGATIVE mg/dL
KETONES UR: NEGATIVE mg/dL
Nitrite: NEGATIVE
PH: 7 (ref 5.0–8.0)
Protein, ur: 30 mg/dL — AB
Specific Gravity, Urine: 1.019 (ref 1.005–1.030)

## 2016-03-28 LAB — LACTIC ACID, PLASMA
LACTIC ACID, VENOUS: 2.3 mmol/L — AB (ref 0.5–1.9)
LACTIC ACID, VENOUS: 1.8 mmol/L (ref 0.5–1.9)

## 2016-03-28 LAB — BASIC METABOLIC PANEL
ANION GAP: 9 (ref 5–15)
BUN: 16 mg/dL (ref 6–20)
CALCIUM: 8.4 mg/dL — AB (ref 8.9–10.3)
CO2: 25 mmol/L (ref 22–32)
Chloride: 97 mmol/L — ABNORMAL LOW (ref 101–111)
Creatinine, Ser: 1.01 mg/dL — ABNORMAL HIGH (ref 0.44–1.00)
GFR, EST NON AFRICAN AMERICAN: 56 mL/min — AB (ref >60)
Glucose, Bld: 102 mg/dL — ABNORMAL HIGH (ref 65–99)
Potassium: 4.4 mmol/L (ref 3.5–5.1)
SODIUM: 131 mmol/L — AB (ref 135–145)

## 2016-03-28 LAB — TROPONIN I: Troponin I: <0.03 ng/mL (ref <0.03)

## 2016-03-28 LAB — PROCALCITONIN: PROCALCITONIN: 0.39 ng/mL

## 2016-03-28 MED ORDER — SODIUM CHLORIDE 0.9 % IV BOLUS (SEPSIS)
1000.0000 mL | Freq: Once | INTRAVENOUS | Status: AC
  Administered 2016-03-28: 1000 mL via INTRAVENOUS

## 2016-03-28 MED ORDER — ESCITALOPRAM OXALATE 10 MG PO TABS
10.0000 mg | ORAL_TABLET | Freq: Every evening | ORAL | Status: DC
  Administered 2016-03-28 – 2016-04-03 (×6): 10 mg via ORAL
  Filled 2016-03-28 (×6): qty 1

## 2016-03-28 MED ORDER — MOMETASONE FURO-FORMOTEROL FUM 200-5 MCG/ACT IN AERO
2.0000 | INHALATION_SPRAY | Freq: Two times a day (BID) | RESPIRATORY_TRACT | Status: DC
  Administered 2016-03-30 – 2016-03-31 (×4): 2 via RESPIRATORY_TRACT
  Filled 2016-03-28: qty 8.8

## 2016-03-28 MED ORDER — DILTIAZEM HCL ER COATED BEADS 360 MG PO CP24
360.0000 mg | ORAL_CAPSULE | Freq: Every day | ORAL | Status: DC
  Administered 2016-03-29 – 2016-04-04 (×7): 360 mg via ORAL
  Filled 2016-03-28 (×3): qty 1
  Filled 2016-03-28 (×4): qty 2
  Filled 2016-03-28 (×2): qty 1
  Filled 2016-03-28 (×2): qty 2
  Filled 2016-03-28 (×2): qty 1
  Filled 2016-03-28: qty 2

## 2016-03-28 MED ORDER — IPRATROPIUM BROMIDE 0.02 % IN SOLN
RESPIRATORY_TRACT | Status: AC
  Filled 2016-03-28: qty 2.5

## 2016-03-28 MED ORDER — RIVAROXABAN 20 MG PO TABS
20.0000 mg | ORAL_TABLET | Freq: Every day | ORAL | Status: DC
  Administered 2016-03-28 – 2016-04-03 (×6): 20 mg via ORAL
  Filled 2016-03-28 (×6): qty 1

## 2016-03-28 MED ORDER — HYDROCODONE-ACETAMINOPHEN 5-325 MG PO TABS
ORAL_TABLET | ORAL | Status: AC
  Administered 2016-03-28: 1
  Filled 2016-03-28: qty 2

## 2016-03-28 MED ORDER — ACETAMINOPHEN 650 MG RE SUPP: 650.0000 mg | Freq: Four times a day (QID) | RECTAL | Status: DC | PRN

## 2016-03-28 MED ORDER — ORAL CARE MOUTH RINSE
15.0000 mL | Freq: Two times a day (BID) | OROMUCOSAL | Status: DC
  Administered 2016-03-28 – 2016-04-04 (×12): 15 mL via OROMUCOSAL

## 2016-03-28 MED ORDER — PANTOPRAZOLE SODIUM 40 MG PO TBEC
40.0000 mg | DELAYED_RELEASE_TABLET | Freq: Every day | ORAL | Status: DC
  Administered 2016-03-29 – 2016-04-04 (×7): 40 mg via ORAL
  Filled 2016-03-28 (×7): qty 1

## 2016-03-28 MED ORDER — SODIUM CHLORIDE 0.9 % IV SOLN
INTRAVENOUS | Status: DC
  Administered 2016-03-28 – 2016-03-29 (×2): via INTRAVENOUS

## 2016-03-28 MED ORDER — LEVALBUTEROL HCL 1.25 MG/0.5ML IN NEBU
1.2500 mg | INHALATION_SOLUTION | RESPIRATORY_TRACT | Status: DC | PRN
Start: 2016-03-28 — End: 2016-03-29
  Filled 2016-03-28: qty 0.5

## 2016-03-28 MED ORDER — METOPROLOL TARTRATE 50 MG PO TABS
50.0000 mg | ORAL_TABLET | Freq: Two times a day (BID) | ORAL | Status: DC
  Administered 2016-03-28 – 2016-04-04 (×14): 50 mg via ORAL
  Filled 2016-03-28 (×14): qty 1

## 2016-03-28 MED ORDER — DEXTROSE 5 % IV SOLN
1.0000 g | Freq: Once | INTRAVENOUS | Status: AC
  Administered 2016-03-29: 1 g via INTRAVENOUS
  Filled 2016-03-28: qty 1

## 2016-03-28 MED ORDER — GUAIFENESIN ER 600 MG PO TB12
1200.0000 mg | ORAL_TABLET | Freq: Two times a day (BID) | ORAL | Status: DC
  Administered 2016-03-28 – 2016-04-04 (×14): 1200 mg via ORAL
  Filled 2016-03-28 (×14): qty 2

## 2016-03-28 MED ORDER — HYDROCODONE-ACETAMINOPHEN 10-325 MG PO TABS
1.0000 | ORAL_TABLET | Freq: Four times a day (QID) | ORAL | Status: DC | PRN
  Administered 2016-03-28 – 2016-04-04 (×19): 1 via ORAL
  Filled 2016-03-28 (×19): qty 1

## 2016-03-28 MED ORDER — ACETAMINOPHEN 325 MG PO TABS
650.0000 mg | ORAL_TABLET | Freq: Four times a day (QID) | ORAL | Status: DC | PRN
Start: 2016-03-28 — End: 2016-04-04
  Filled 2016-03-28: qty 2

## 2016-03-28 MED ORDER — VANCOMYCIN HCL IN DEXTROSE 1-5 GM/200ML-% IV SOLN
1000.0000 mg | Freq: Once | INTRAVENOUS | Status: AC
  Administered 2016-03-29: 1000 mg via INTRAVENOUS
  Filled 2016-03-28: qty 200

## 2016-03-28 MED ORDER — VANCOMYCIN HCL 500 MG IV SOLR
500.0000 mg | Freq: Two times a day (BID) | INTRAVENOUS | Status: DC
  Administered 2016-03-29 – 2016-04-01 (×7): 500 mg via INTRAVENOUS
  Filled 2016-03-28 (×14): qty 500

## 2016-03-28 MED ORDER — RIVAROXABAN (XARELTO) VTE STARTER PACK (15 & 20 MG): 20.0000 mg | ORAL_TABLET | Freq: Every evening | ORAL | Status: DC

## 2016-03-28 MED ORDER — DEXTROSE 5 % IV SOLN
1.0000 g | Freq: Two times a day (BID) | INTRAVENOUS | Status: DC
  Administered 2016-03-29 – 2016-04-04 (×13): 1 g via INTRAVENOUS
  Filled 2016-03-28 (×15): qty 1

## 2016-03-28 MED ORDER — POTASSIUM CHLORIDE CRYS ER 20 MEQ PO TBCR
40.0000 meq | EXTENDED_RELEASE_TABLET | Freq: Every day | ORAL | Status: DC
  Administered 2016-03-29 – 2016-04-01 (×4): 40 meq via ORAL
  Filled 2016-03-28 (×4): qty 2

## 2016-03-28 NOTE — H&P (Signed)
History and Physical    Lindsay Salazar [id number] DOB: 12-23-1950 DOA: 03/28/2016  PCP: [name], PA-C Patient coming from: Cardiologists office  Chief Complaint: generalized weakness  HPI: Lindsay Salazar is a 66 y.o. female with medical history significant of ongoing chemotherapy for pancreatic cancer, atrophic fibrillation, COPD, fibromyalgia, GERD, hypertension presenting with approximately one-week history of worsening fatigue and generalized weakness. Patient reports intermittent palpitations. Typically with exertion. Subjective fevers described as "feeling warm. " Patient endorses baseline O2 requirement only at night while sleeping. Patient seen in the ED 4 days ago and diagnosed with possible early pneumonia and was started on by mouth Levaquin. Additionally patient was restarted on her Xarelto despite having a negative PE study due to her history of atrial fibrillation and concern for stroke. Patient's last chemotherapy was on 03/26/2016.  Denies chest pain, nausea, vomiting, diarrhea, rash, neck stiffness, headache, confusion, focal neurological deficit.      ED Course: 1L NS bolus given  Review of Systems: As per HPI otherwise 10 point review of systems negative.   Ambulatory Status:restricted recently due to physical deconditioning and O2 requirement  Past Medical History:  Diagnosis Date  . Anginal pain ([place])    thought she was having   heartburn  . Arthritis    bursitis , fibromyalgia  . Arthritis    "right hip" (11/27/2015)  . Asthma   . Atrial fibrillation ([place])   . Chronic bronchitis ([place])   . Chronic right hip pain   . COPD (chronic obstructive pulmonary disease) ([place])    wears home o2 prn  . Daily headache    "last 2-3 months" (11/27/2015)  . Exposure to TB    "mom had it when she was pregnant with me"  . Fibromyalgia   . GERD (gastroesophageal reflux disease)    use to have it but not now  . Heart murmur    22-  89 years old  . Hypertension   . On  home oxygen therapy    11/27/2015 "whenever I need it; don't know how many liters"  . Pancreatic cancer ([place])   . Pneumonia    "several times" (11/27/2015)  . Pneumothorax     Past Surgical History:  Procedure Laterality Date  . ANTERIOR CERVICAL DECOMP/DISCECTOMY FUSION     "put 4 screws in"  . BACK SURGERY    . CARDIAC CATHETERIZATION  08/2004   Archie Endo 06/18/2010  . CARDIOVERSION N/A 07/18/2015   Procedure: CARDIOVERSION;  Surgeon: Josue Hector, MD;  Location: [facility];  Service: Cardiovascular;  Laterality: N/A;  . ESOPHAGOGASTRODUODENOSCOPY N/A 08/09/2015   Procedure: ESOPHAGOGASTRODUODENOSCOPY (EGD);  Surgeon: Rogene Houston, MD;  Location: [facility];  Service: Endoscopy;  Laterality: N/A;  . EUS N/A 11/29/2015   Procedure: UPPER ENDOSCOPIC ULTRASOUND (EUS) RADIAL;  Surgeon: Milus Banister, MD;  Location: [facility];  Service: Endoscopy;  Laterality: N/A;  . IR GENERIC HISTORICAL  12/18/2015   IR US GUIDE VASC ACCESS RIGHT 12/18/2015 Sandi Mariscal, MD [facility]  . IR GENERIC HISTORICAL  12/18/2015   IR FLUORO GUIDE PORT INSERTION RIGHT 12/18/2015 Sandi Mariscal, MD [facility]  . TONSILLECTOMY    . TUBAL LIGATION      Social History   Social History  . Marital status: Divorced    Spouse name: N/A  . Number of children: N/A  . Years of education: N/A   Occupational History  . retired    Social History Main Topics  . Smoking status:  Former Smoker    Packs/day: 0.50    Years: 46.00    Types: Cigarettes    Start date: 10/09/1969    Quit date: 11/29/2015  . Smokeless tobacco: Never Used  . Alcohol use No     Comment: quit 30 years ago  . Drug use: No  . Sexual activity: Not Currently    Birth control/ protection: Surgical, Post-menopausal   Other Topics Concern  . Not on file   Social History Narrative  . No narrative on file    Allergies  Allergen Reactions  . Ibuprofen Nausea And Vomiting  . Other     Seafood   . Penicillins Swelling     Has patient had a PCN reaction causing immediate rash, facial/tongue/throat swelling, SOB or lightheadedness with hypotension: Yes Has patient had a PCN reaction causing severe rash involving mucus membranes or skin necrosis: No Has patient had a PCN reaction that required hospitalization No Has patient had a PCN reaction occurring within the last 10 years: No If all of the above answers are "NO", then may proceed with Cephalosporin use.   . Tiotropium Bromide Monohydrate Itching  . Tramadol Nausea And Vomiting    Family History  Problem Relation Age of Onset  . COPD Mother   . Diabetes Father   . Stroke Father     Prior to Admission medications   Medication Sig Start Date End Date Taking? Authorizing Provider  COMBIVENT RESPIMAT 20-100 MCG/ACT AERS respimat Inhale 1 puff into the lungs 2 (two) times daily. 12/03/15  Yes Ripudeep Krystal Eaton, MD  diltiazem (CARDIZEM CD) 360 MG 24 hr capsule Take 1 capsule (360 mg total) by mouth daily. 02/06/16  Yes Kathie Dike, MD  escitalopram (LEXAPRO) 10 MG tablet Take 1 tablet (10 mg total) by mouth every evening. 12/03/15  Yes Ripudeep Krystal Eaton, MD  Gemcitabine HCl (GEMZAR IV) Inject into the vein. Day 1, day 8, day 15, every 28 days   Yes Historical Provider, MD  guaiFENesin (MUCINEX) 600 MG 12 hr tablet Take 1 tablet (600 mg total) by mouth 2 (two) times daily. 03/02/16  Yes Kathie Dike, MD  HYDROcodone-acetaminophen (NORCO/VICODIN) 5-325 MG tablet Take 1 tablet by mouth every 4 (four) hours as needed for moderate pain (Must last 14 days.Do not take and drive a car or use machinery.). 03/11/16  Yes Sanjuana Kava, MD  ipratropium (ATROVENT) 0.02 % nebulizer solution Take 2.5 mLs (0.5 mg total) by nebulization 3 (three) times daily. 01/23/16  Yes Orvan Falconer, MD  ipratropium-albuterol (DUONEB) 0.5-2.5 (3) MG/3ML SOLN Inhale 3 mLs into the lungs every 6 (six) hours as needed (for shortness of breath).  03/03/16  Yes Historical Provider, MD  levalbuterol  (XOPENEX) 1.25 MG/0.5ML nebulizer solution Take 1.25 mg by nebulization every 4 (four) hours as needed for wheezing or shortness of breath. 02/05/16  Yes Kathie Dike, MD  levofloxacin (LEVAQUIN) 500 MG tablet Take 1.5 tablets (750 mg total) by mouth daily. 03/24/16  Yes Forde Dandy, MD  metoprolol tartrate (LOPRESSOR) 50 MG tablet Take 1 tablet (50 mg total) by mouth 2 (two) times daily. 03/02/16  Yes Kathie Dike, MD  ondansetron (ZOFRAN) 8 MG tablet Take 1 tablet by mouth daily as needed for nausea/vomiting. 12/10/15  Yes Historical Provider, MD  OXYGEN Inhale 2 L into the lungs daily as needed (for breathing).   Yes Historical Provider, MD  PACLitaxel Protein-Bound Part (ABRAXANE IV) Inject into the vein. Day 1, day 8, day 15, every 28 days  Yes Historical Provider, MD  pantoprazole (PROTONIX) 40 MG tablet Take 1 tablet (40 mg total) by mouth daily. 12/03/15  Yes Ripudeep Krystal Eaton, MD  potassium chloride SA (K-DUR,KLOR-CON) 20 MEQ tablet Take 2 tablets (40 mEq total) by mouth daily. 02/27/16  Yes Baird Cancer, PA-C  prochlorperazine (COMPAZINE) 10 MG tablet Take 1 tablet by mouth daily as needed for nausea/vomiting. 12/10/15  Yes Historical Provider, MD  SYMBICORT 160-4.5 MCG/ACT inhaler Inhale 1 puff into the lungs 2 (two) times daily. 12/03/15  Yes Ripudeep Krystal Eaton, MD  levofloxacin (LEVAQUIN) 750 MG tablet Take 1 tablet (750 mg total) by mouth daily. Patient not taking: Reported on 03/28/2016 03/02/16   Kathie Dike, MD  nicotine (NICODERM CQ - DOSED IN MG/24 HOURS) 14 mg/24hr patch Place 1 patch (14 mg total) onto the skin daily. Patient not taking: Reported on 03/28/2016 12/03/15   Ripudeep Krystal Eaton, MD  rivaroxaban (XARELTO) 20 MG TABS tablet Take 1 tablet (20 mg total) by mouth daily with supper. Patient not taking: Reported on 03/28/2016 07/19/15   Reyne Dumas, MD  Rivaroxaban 15 & 20 MG TBPK Take 15 mg by mouth 2 (two) times daily. Take as directed on package: Start with one 15mg  tablet by  mouth twice a day with food. On Day 22, switch to one 20mg  tablet once a day with food. 03/24/16   Forde Dandy, MD    Physical Exam: Vitals:   03/28/16 1730 03/28/16 1745 03/28/16 1800 03/28/16 1830  BP: 114/69  91/61 104/62  Pulse: 69   (!) 56  Resp: 19  26 22   Temp:      TempSrc:      SpO2: 98% 97%  98%  Weight:      Height:         General: Ill-appearing coming resting comfortably in bed. Eyes:  PERRL, EOMI, normal lids, iris ENT: poor dentition, mmm Neck:  no LAD, masses or thyromegaly Cardiovascular:  RRR, no m/r/g. No LE edema.  Respiratory: Course breath sounds throughout. Increased effort.  Abdomen:  soft, ntnd, NABS Skin:  no rash or induration seen on limited exam, diffuse eccymosis Musculoskeletal:  grossly normal tone BUE/BLE, good ROM, no bony abnormality Psychiatric:  grossly normal mood and affect, speech fluent and appropriate, AOx3 Neurologic:  CN 2-12 grossly intact, moves all extremities in coordinated fashion, sensation intact  Labs on Admission: I have personally reviewed following labs and imaging studies  CBC:  Recent Labs Lab 03/24/16 1750 03/26/16 0932 03/28/16 1701  WBC 12.4* 14.7* 13.0*  NEUTROABS 8.9* 10.8* 12.0*  HGB 11.7* 12.3 11.0*  HCT 36.5 39.5 34.5*  MCV 95.3 95.2 96.1  PLT 372 406* 0000000   Basic Metabolic Panel:  Recent Labs Lab 03/24/16 1750 03/26/16 0932 03/28/16 1701  NA 138 135 131*  K 3.0* 2.4* 4.4  CL 101 93* 97*  CO2 28 29 25   GLUCOSE 83 174* 102*  BUN 13 13 16   CREATININE 0.96 1.18* 1.01*  CALCIUM 8.6* 7.9* 8.4*   GFR: Estimated Creatinine Clearance: 46.7 mL/min (by C-G formula based on SCr of 1.01 mg/dL (H)). Liver Function Tests:  Recent Labs Lab 03/26/16 0932  AST 26  ALT 22  ALKPHOS 74  BILITOT 0.6  PROT 5.9*  ALBUMIN 3.1*   No results for input(s): LIPASE, AMYLASE in the last 168 hours. No results for input(s): AMMONIA in the last 168 hours. Coagulation Profile: No results for input(s): INR,  PROTIME in the last 168 hours. Cardiac Enzymes:  Recent Labs Lab 03/28/16 1701  TROPONINI <0.03   BNP (last 3 results) No results for input(s): PROBNP in the last 8760 hours. HbA1C: No results for input(s): HGBA1C in the last 72 hours. CBG: No results for input(s): GLUCAP in the last 168 hours. Lipid Profile: No results for input(s): CHOL, HDL, LDLCALC, TRIG, CHOLHDL, LDLDIRECT in the last 72 hours. Thyroid Function Tests: No results for input(s): TSH, T4TOTAL, FREET4, T3FREE, THYROIDAB in the last 72 hours. Anemia Panel: No results for input(s): VITAMINB12, FOLATE, FERRITIN, TIBC, IRON, RETICCTPCT in the last 72 hours. Urine analysis:    Component Value Date/Time   COLORURINE YELLOW 02/07/2016 1130   APPEARANCEUR HAZY (A) 02/07/2016 1130   LABSPEC 1.015 02/07/2016 1130   PHURINE 7.0 02/07/2016 1130   GLUCOSEU NEGATIVE 02/07/2016 1130   HGBUR NEGATIVE 02/07/2016 1130   BILIRUBINUR NEGATIVE 02/07/2016 1130   KETONESUR NEGATIVE 02/07/2016 1130   PROTEINUR NEGATIVE 02/07/2016 1130   NITRITE NEGATIVE 02/07/2016 1130   LEUKOCYTESUR TRACE (A) 02/07/2016 1130    Creatinine Clearance: Estimated Creatinine Clearance: 46.7 mL/min (by C-G formula based on SCr of 1.01 mg/dL (H)).  Sepsis Labs: @LABRCNTIP (procalcitonin:4,lacticidven:4) )No results found for this or any previous visit (from the past 240 hour(s)).   Radiological Exams on Admission: Dg Chest 2 View  Result Date: 03/28/2016 CLINICAL DATA:  66 y/o  F; weakness, shortness of breath. EXAM: CHEST  2 VIEW COMPARISON:  02/29/2016 chest radiograph FINDINGS: Normal stable cardiac silhouette. Minor stable biapical pleuroparenchymal scarring. No consolidation, effusion, or pneumothorax. Emphysema. Aortic atherosclerosis with calcification. Right port catheter with tip projecting over mid SVC. Anterior cervical fusion hardware noted. No acute osseous abnormality. IMPRESSION: Emphysema. No acute cardiopulmonary process. Aortic  atherosclerosis. Electronically Signed   By: Kristine Garbe M.D.   On: 03/28/2016 17:48    EKG: Independently reviewed. Afib. No acs  Assessment/Plan Active Problems:   Atrial fibrillation (HCC)   Chronic obstructive pulmonary disease (COPD) (HCC)   Chronic anticoagulation-Xarelto   Leukocytosis   Pancreatic cancer (H[facility])   Hypertension   Acute on chronic respiratory failure (HCC)   Hyponatremia   HCAP (healthcare-associated pneumonia)   Lactic acidemia   Acute on chronic respiratory failure: likely secondary to HCAP with underlying COPD (no acute exacerbation).Baseline O2 requirement only at night. Currently requiring 2 L. Patient desaturates considerably during ambulation. Recent CT concerning for early infectious process that patient was started on Levaquin. Due to patient's ongoing chemotherapy patient unlikely to be able to mount proper immune response. - Vanc/Cef (hold levaquin) - pneumonia orderset - BCX, Res vir panel, procalcitonin - neutropenic precautions - Wean O2 as able - repeat CXR in am - contineu symbicort, dulera, xopenex  Pancreatic Cancer: ongoing chemo w/ last treatment on 03/26/16.  - continue outpt therapy - CBC in am (downward trend in all cell lines)  Dehydration: Poor oral intake over the last several days. Positive orthostatic vital signs. 1 L normal saline bolus given in ED. - IVF - Repeat orthostatic vital signs in a.m.  Lactic acidemia: likely from hypoxemia and dehydration - Trend - IVF and treatment as above  Hyponatremia: 131 - mild.  - IVF  GERD: - contineu ppi  AFib: currently rate controlled - contineu Xarelto, Dilt, [place]  Depression: - contineu lexapro  DVT prophylaxis: Xarelto  Code Status: full  Family Communication: none  Disposition Plan: pending improvoemetn and return of cultures  Consults called: none  Admission status: obs    MERRELL, DAVID J MD [facility]  If 7PM-7AM, please  contact  night-coverage [email or web address] Password [facility]  03/28/2016, 7:40 PM

## 2016-03-28 NOTE — Progress Notes (Signed)
Name: Lindsay Salazar    DOB: 09-10-1950  Age: 66 y.o.  MR#: [id number]       PCP:  [name], PA-C      Insurance: Payor: [employer] MEDICARE / Plan: [place] [employer] / Product Type: *No Product type* /   CC:   No chief complaint on file.   VS Vitals:   03/28/16 1549  Weight: 125 lb 9.6 oz (57 kg)  Height: 5\' 4"  (1.626 m)    Weights Current Weight  03/28/16 125 lb 9.6 oz (57 kg)  03/26/16 120 lb 6.4 oz (54.6 kg)  03/24/16 171 lb (77.6 kg)    Blood Pressure  BP Readings from Last 3 Encounters:  03/26/16 102/89  03/24/16 134/85  03/05/16 118/61     Admit date:  (Not on file) Last encounter with RMR:  02/26/2016   Allergy Ibuprofen; Other; Penicillins; Tiotropium bromide monohydrate; and Tramadol  Current Outpatient Prescriptions  Medication Sig Dispense Refill  . COMBIVENT RESPIMAT 20-100 MCG/ACT AERS respimat Inhale 1 puff into the lungs 2 (two) times daily. 1 Inhaler 5  . diltiazem (CARDIZEM CD) 360 MG 24 hr capsule Take 1 capsule (360 mg total) by mouth daily. 60 capsule 1  . escitalopram (LEXAPRO) 10 MG tablet Take 1 tablet (10 mg total) by mouth every evening. 30 tablet 0  . Gemcitabine HCl (GEMZAR IV) Inject into the vein. Day 1, day 8, day 15, every 28 days    . guaiFENesin (MUCINEX) 600 MG 12 hr tablet Take 1 tablet (600 mg total) by mouth 2 (two) times daily. 30 tablet 0  . HYDROcodone-acetaminophen (NORCO/VICODIN) 5-325 MG tablet Take 1 tablet by mouth every 4 (four) hours as needed for moderate pain (Must last 14 days.Do not take and drive a car or use machinery.). 50 tablet 0  . ipratropium (ATROVENT) 0.02 % nebulizer solution Take 2.5 mLs (0.5 mg total) by nebulization 3 (three) times daily. 75 mL 12  . ipratropium-albuterol (DUONEB) 0.5-2.5 (3) MG/3ML SOLN Inhale 3 mLs into the lungs every 6 (six) hours as needed (for shortness of breath).     Marland Kitchen levalbuterol (XOPENEX) 1.25 MG/0.5ML nebulizer solution Take 1.25 mg by nebulization every 4 (four) hours as needed  for wheezing or shortness of breath. 1 each 12  . levofloxacin (LEVAQUIN) 500 MG tablet Take 1.5 tablets (750 mg total) by mouth daily. 10 tablet 0  . levofloxacin (LEVAQUIN) 750 MG tablet Take 1 tablet (750 mg total) by mouth daily. 4 tablet 0  . metoprolol tartrate (LOPRESSOR) 50 MG tablet Take 1 tablet (50 mg total) by mouth 2 (two) times daily. 60 tablet 0  . nicotine (NICODERM CQ - DOSED IN MG/24 HOURS) 14 mg/24hr patch Place 1 patch (14 mg total) onto the skin daily. 28 patch 3  . ondansetron (ZOFRAN) 8 MG tablet Take 1 tablet by mouth daily as needed for nausea/vomiting.    . OXYGEN Inhale 2 L into the lungs daily as needed (for breathing).    Marland Kitchen PACLitaxel Protein-Bound Part (ABRAXANE IV) Inject into the vein. Day 1, day 8, day 15, every 28 days    . pantoprazole (PROTONIX) 40 MG tablet Take 1 tablet (40 mg total) by mouth daily. 60 tablet 3  . potassium chloride SA (K-DUR,KLOR-CON) 20 MEQ tablet Take 2 tablets (40 mEq total) by mouth daily. 60 tablet 0  . prochlorperazine (COMPAZINE) 10 MG tablet Take 1 tablet by mouth daily as needed for nausea/vomiting.    . rivaroxaban (XARELTO) 20 MG TABS  tablet Take 1 tablet (20 mg total) by mouth daily with supper. 30 tablet 10  . Rivaroxaban 15 & 20 MG TBPK Take 15 mg by mouth 2 (two) times daily. Take as directed on package: Start with one 15mg  tablet by mouth twice a day with food. On Day 22, switch to one 20mg  tablet once a day with food. 51 each 0  . SYMBICORT 160-4.5 MCG/ACT inhaler Inhale 1 puff into the lungs 2 (two) times daily. 1 Inhaler 12   No current facility-administered medications for this visit.     Discontinued Meds:    Medications Discontinued During This Encounter  Medication Reason  . predniSONE (DELTASONE) 10 MG tablet Error  . docusate sodium (COLACE) 100 MG capsule Error    Patient Active Problem List   Diagnosis Date Noted  . COPD exacerbation ([place]) 02/29/2016  . Rectal bleed 02/17/2016  . AKI (acute kidney injury)  ([place]) 02/17/2016  . Chronic respiratory failure with hypoxia ([place]) 01/29/2016  . Malnutrition of moderate degree 01/29/2016  . Atrial fibrillation with rapid ventricular response ([place]) 01/28/2016  . Hypertension 01/28/2016  . Anxiety 01/28/2016  . Iron deficiency anemia 01/24/2016  . B12 deficiency 01/24/2016  . COPD with acute exacerbation ([place]) 01/20/2016  . Pancreatic cancer ([place]) 12/07/2015  . Epigastric pain   . Abdominal pain 11/27/2015  . Hypokalemia 11/27/2015  . Leukocytosis 11/27/2015  . Chronic anticoagulation-Xarelto 09/06/2015  . Gastrointestinal hemorrhage with melena 08/08/2015  . Acute blood loss anemia 08/08/2015  . Chronic obstructive pulmonary disease (COPD) ([place]) 08/08/2015  . Chest pain 08/08/2015  . Hyperglycemia 07/17/2015  . Anemia 07/17/2015  . Atrial fibrillation ([place]) 07/17/2015  . Hypertensive urgency 06/27/2015  . Tobacco abuse 06/27/2015  . Elevated troponin 06/27/2015  . CAP (community acquired pneumonia)   . OSTEOPOROSIS 04/10/2009  . WEIGHT LOSS 04/10/2009    LABS    Component Value Date/Time   NA 135 03/26/2016 0932   NA 138 03/24/2016 1750   NA 139 03/05/2016 0900   K 2.4 (LL) 03/26/2016 0932   K 3.0 (L) 03/24/2016 1750   K 3.7 03/05/2016 0900   CL 93 (L) 03/26/2016 0932   CL 101 03/24/2016 1750   CL 105 03/05/2016 0900   CO2 29 03/26/2016 0932   CO2 28 03/24/2016 1750   CO2 25 03/05/2016 0900   GLUCOSE 174 (H) 03/26/2016 0932   GLUCOSE 83 03/24/2016 1750   GLUCOSE 114 (H) 03/05/2016 0900   BUN 13 03/26/2016 0932   BUN 13 03/24/2016 1750   BUN 24 (H) 03/05/2016 0900   CREATININE 1.18 (H) 03/26/2016 0932   CREATININE 0.96 03/24/2016 1750   CREATININE 0.80 03/05/2016 0900   CALCIUM 7.9 (L) 03/26/2016 0932   CALCIUM 8.6 (L) 03/24/2016 1750   CALCIUM 8.3 (L) 03/05/2016 0900   GFRNONAA 47 (L) 03/26/2016 0932   GFRNONAA 60 (L) 03/24/2016 1750   GFRNONAA >60 03/05/2016 0900   GFRAA 54 (L) 03/26/2016 0932   GFRAA >60 03/24/2016  1750   GFRAA >60 03/05/2016 0900   CMP     Component Value Date/Time   NA 135 03/26/2016 0932   K 2.4 (LL) 03/26/2016 0932   CL 93 (L) 03/26/2016 0932   CO2 29 03/26/2016 0932   GLUCOSE 174 (H) 03/26/2016 0932   BUN 13 03/26/2016 0932   CREATININE 1.18 (H) 03/26/2016 0932   CALCIUM 7.9 (L) 03/26/2016 0932   PROT 5.9 (L) 03/26/2016 0932   ALBUMIN 3.1 (L) 03/26/2016 0932   AST 26 03/26/2016  0932   ALT 22 03/26/2016 0932   ALKPHOS 74 03/26/2016 0932   BILITOT 0.6 03/26/2016 0932   GFRNONAA 47 (L) 03/26/2016 0932   GFRAA 54 (L) 03/26/2016 0932       Component Value Date/Time   WBC 14.7 (H) 03/26/2016 0932   WBC 12.4 (H) 03/24/2016 1750   WBC 6.2 03/05/2016 0900   HGB 12.3 03/26/2016 0932   HGB 11.7 (L) 03/24/2016 1750   HGB 8.9 (L) 03/05/2016 0900   HCT 39.5 03/26/2016 0932   HCT 36.5 03/24/2016 1750   HCT 28.4 (L) 03/05/2016 0900   MCV 95.2 03/26/2016 0932   MCV 95.3 03/24/2016 1750   MCV 93.4 03/05/2016 0900    Lipid Panel  No results found for: CHOL, TRIG, HDL, CHOLHDL, VLDL, LDLCALC, LDLDIRECT  ABG No results found for: PHART, PCO2ART, PO2ART, HCO3, TCO2, ACIDBASEDEF, O2SAT   Lab Results  Component Value Date   TSH 2.678 06/27/2015   BNP (last 3 results)  Recent Labs  01/28/16 0119 02/29/16 1341 03/24/16 1750  BNP 113.0* 223.0* 394.0*    ProBNP (last 3 results) No results for input(s): PROBNP in the last 8760 hours.  Cardiac Panel (last 3 results) No results for input(s): CKTOTAL, CKMB, TROPONINI, RELINDX in the last 72 hours.  Iron/TIBC/Ferritin/ %Sat    Component Value Date/Time   IRON 207 (H) 02/18/2016 0952   TIBC 346 02/18/2016 0952   FERRITIN 874 (H) 02/18/2016 0952   IRONPCTSAT 60 (H) 02/18/2016 0952     EKG Orders placed or performed during the hospital encounter of 03/24/16  . ED EKG  . ED EKG  . EKG     Prior Assessment and Plan Problem List as of 03/28/2016 Reviewed: 03/26/2016  9:39 AM by Wynelle Cleveland Beckie Salts, RN      Cardiovascular and Mediastinum   Hypertensive urgency   Atrial fibrillation [facility])   Atrial fibrillation with rapid ventricular response ([place])   Hypertension     Respiratory   Chronic obstructive pulmonary disease (COPD) [facility])   Last Assessment & Plan 09/06/2015 Office Visit Written 09/06/2015  3:20 PM by Erlene Quan, PA-C    Still smoking occasionally.       CAP (community acquired pneumonia)   COPD with acute exacerbation ([place])   Chronic respiratory failure with hypoxia ([place])   COPD exacerbation [facility])     Digestive   Gastrointestinal hemorrhage with melena   Last Assessment & Plan 09/06/2015 Office Visit Written 09/06/2015  3:18 PM by Erlene Quan, PA-C    Just discharged 08/12/15, back on Xarelto. Appears to be stable.      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 Ardis Hughs on 11/29/2015 with FNA demonstrating adenocarcinoma.  CA 19-9 elevated at the initiation of treatment at 197.  Started on Gemcitabine/Abraxane beginning on 12/19/2015 in a day 1, 8, 15 every 28 day fashion.  Oncology history updated.  Pre-treatment labs today: CBC diff, CMET.  I personally reviewed and went over laboratory results with the patient.  The results are noted within this dictation.  Anemia is noted.  Hypokalemia is noted as well.  Will give Kdur 60 mEq today in the clinic, PO.  She will get 3 runs of 10 mEq K+ IV (total of 30 mEq).  Will give an Rx for Kdur 40 mEq daily.  Will recheck K+ next week with treatment.  Given her anemia and her inability to tolerate anemia, will plan on transfusing 2 units of PRBCs next week.  Her treatment course has been complicated by hygiene issues, hospitalizations, and ED visits.  She will get treated today for day #8 of cycle #3.  She will  return next week for day #15 of cycle #3.    Order placed for MRI abdomen the first week in Feb 2018.    If imaging demonstrates improvement, will contact Dr. Barry Dienes for future surgical planning/recommendation(s).  [place] reports that Dr. Luna Glasgow wants [place] to consumer her pain medication.  I have messaged Dr. Luna Glasgow for confirmation/clarification.  [place] Controlled Substance Reporting System is reviewed.  She has been getting her pain medication for her right hip from Dr. Luna Glasgow for a number of months.  [place] notes that she is involved with a pain contract with Dr. Luna Glasgow.  I have received a message back from Dr. Luna Glasgow and he is interested in having [place] consume her pain medication needs.  Return for follow-up 2nd week in Feb 2018.      Rectal bleed     Musculoskeletal and Integument   OSTEOPOROSIS     Genitourinary   AKI (acute kidney injury) ([place])     Other   WEIGHT LOSS   Tobacco abuse   Elevated troponin   Hyperglycemia   Anemia   Acute blood loss anemia   Chest pain   Chronic anticoagulation-Xarelto   Last Assessment & Plan 09/06/2015 Office Visit Written 09/06/2015  3:21 PM by Erlene Quan, PA-C    Resumed after recent GI bleed. CHADs VASc=3      Abdominal pain   Hypokalemia   Leukocytosis   Epigastric pain   Iron deficiency anemia   B12 deficiency   Anxiety   Malnutrition of moderate degree       Imaging: Dg Chest 2 View  Result Date: 02/29/2016 CLINICAL DATA:  Shortness of Breath EXAM: CHEST  2 VIEW COMPARISON:  02/23/2016 FINDINGS: Right Port-A-Cath remains in place, unchanged. There is hyperinflation of the lungs compatible with COPD. Biapical scarring. Heart is upper limits normal in size. No effusions or confluent airspace opacities. IMPRESSION: COPD/chronic changes.  Borderline heart size.  No active disease. Electronically Signed   By: Rolm Baptise M.D.   On: 02/29/2016 14:18   Ct Angio Chest Pe W And/or Wo Contrast  Result Date: 03/24/2016 CLINICAL  DATA:  Right lower extremity DVT. Shortness breath and chest pain. EXAM: CT ANGIOGRAPHY CHEST WITH CONTRAST TECHNIQUE: Multidetector CT imaging of the chest was performed using the standard protocol during bolus administration of intravenous contrast. Multiplanar CT image reconstructions and MIPs were obtained to evaluate the vascular anatomy. CONTRAST:  100 mL Isovue 370 COMPARISON:  Right lower extremity DVT study 03/24/2016. Two-view chest x-ray 02/29/2016. CT of the chest 12/24/2015. FINDINGS: Cardiovascular: The heart size is normal. No significant coronary artery disease present. There is no pericardial effusion. Main pulmonary arteries are of normal size. The study is mildly degraded by patient breathing motion. No focal filling defects are present to suggest pulmonary emboli. Mediastinum/Nodes: Atherosclerotic calcifications are present in the aorta without focal stenosis or aneurysm. The left vertebral artery originates from the aorta. Distal esophageal dilation small hiatal hernia are present. Lungs/Pleura: Severe centrilobular emphysema is present. There is no focal nodule or mass lesion present. Ill-defined ground-glass attenuation is present in the upper lobes  bilaterally, new since the prior exam Upper Abdomen: A 7 cm left hepatic cyst is stable. The spleen is within normal limits. Kidneys and adrenal glands are normal. Musculoskeletal: Bone windows demonstrate exaggerated kyphosis of the thoracic spine. No acute or healing fracture is present. There is no focal lytic or blastic lesion. Cervical spine fusion is again noted. Review of the MIP images confirms the above findings. IMPRESSION: 1. No evidence for pulmonary embolus. 2. Severe centrilobular emphysema. 3. Ill-defined ground-glass attenuation in the upper lobes bilaterally likely representing edema or early infection. Atelectasis could have a similar appearance. 4. Stable hepatic cyst. 5. Atherosclerosis without aneurysm or focal stenosis of the  great vessel origins. Electronically Signed   By: San Morelle M.D.   On: 03/24/2016 20:12   Mr Abdomen W Wo Contrast  Result Date: 03/12/2016 CLINICAL DATA:  Pancreatic cancer. EXAM: MRI ABDOMEN WITHOUT AND WITH CONTRAST TECHNIQUE: Multiplanar multisequence MR imaging of the abdomen was performed both before and after the administration of intravenous contrast. CONTRAST:  84mL MULTIHANCE GADOBENATE DIMEGLUMINE 529 MG/ML IV SOLN COMPARISON:  Multiple exams, including 11/28/2015 FINDINGS: Despite efforts by the technologist and patient, motion artifact is present on today's exam and could not be eliminated. This reduces exam sensitivity and specificity. Lower chest: Unremarkable Hepatobiliary: Innumerable tiny T2 hyperintensities throughout the hepatic parenchyma, probably biliary hamartoma is, similar to prior. Larger 6.8 by 5.8 cm left hepatic lobe cyst. Pancreas: Hypoenhancing infiltrative approximately 3.4 by 1.7 cm region of the pancreatic body and tail, subjectively this appears slightly smaller than on the prior exam and it also measures smaller. This is associated with atrophy of the distal pancreatic tail and dilatation of the associated dorsal pancreatic duct in the distal tail. The masslike portion of the pancreas is closely associated with the splenic vein although no splenic vein thrombosis is currently identified. The splenic artery is also thought to extend along the margin of the mass. Spleen:  Unremarkable Adrenals/Urinary Tract: Small septation in a Bosniak category 2 cyst of the right kidney lower pole. Adrenal glands normal. Stomach/Bowel: Prominent stool throughout the colon favors constipation. Vascular/Lymphatic: Splenic vein skirts the posterior margin of the pancreatic mass. There is some atherosclerotic luminal irregularity in the abdominal aorta without aneurysm. Other:  No supplemental non-categorized findings. Musculoskeletal: Lumbar spondylosis and degenerative disc disease.  IMPRESSION: 1. Reduced size of the pancreatic body/tail mass. On axial images the mass measures 3.4 by 1.7 cm, formerly 4.1 by 2.4 cm. No visible metastatic lesions currently. 2.  Prominent stool throughout the colon favors constipation. 3. 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 There is a larger left hepatic lobe cyst. Electronically Signed   By: Van Clines M.D.   On: 03/12/2016 12:32   Mr 3d Recon At Scanner  Result Date: 03/12/2016 CLINICAL DATA:  Pancreatic cancer. EXAM: MRI ABDOMEN WITHOUT AND WITH CONTRAST TECHNIQUE: Multiplanar multisequence MR imaging of the abdomen was performed both before and after the administration of intravenous contrast. CONTRAST:  49mL MULTIHANCE GADOBENATE DIMEGLUMINE 529 MG/ML IV SOLN COMPARISON:  Multiple exams, including 11/28/2015 FINDINGS: Despite efforts by the technologist and patient, motion artifact is present on today's exam and could not be eliminated. This reduces exam sensitivity and specificity. Lower chest: Unremarkable Hepatobiliary: Innumerable tiny T2 hyperintensities throughout the hepatic parenchyma, probably biliary hamartoma is, similar to prior. Larger 6.8 by 5.8 cm left hepatic lobe cyst. Pancreas: Hypoenhancing infiltrative approximately 3.4 by 1.7 cm region of the pancreatic body and tail, subjectively this appears slightly smaller than on  the prior exam and it also measures smaller. This is associated with atrophy of the distal pancreatic tail and dilatation of the associated dorsal pancreatic duct in the distal tail. The masslike portion of the pancreas is closely associated with the splenic vein although no splenic vein thrombosis is currently identified. The splenic artery is also thought to extend along the margin of the mass. Spleen:  Unremarkable Adrenals/Urinary Tract: Small septation in a Bosniak category 2 cyst of the right kidney lower pole. Adrenal glands normal. Stomach/Bowel: Prominent stool throughout the colon favors  constipation. Vascular/Lymphatic: Splenic vein skirts the posterior margin of the pancreatic mass. There is some atherosclerotic luminal irregularity in the abdominal aorta without aneurysm. Other:  No supplemental non-categorized findings. Musculoskeletal: Lumbar spondylosis and degenerative disc disease. IMPRESSION: 1. Reduced size of the pancreatic body/tail mass. On axial images the mass measures 3.4 by 1.7 cm, formerly 4.1 by 2.4 cm. No visible metastatic lesions currently. 2.  Prominent stool throughout the colon favors constipation. 3. 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 There is a larger left hepatic lobe cyst. Electronically Signed   By: Van Clines M.D.   On: 03/12/2016 12:32   US Venous Img Lower Unilateral Right  Result Date: 03/24/2016 CLINICAL DATA:  Right lower extremity edema and pains for 2-3 weeks with shortness of breath EXAM: Right LOWER EXTREMITY VENOUS DOPPLER ULTRASOUND TECHNIQUE: Gray-scale sonography with graded compression, as well as color Doppler and duplex ultrasound were performed to evaluate the lower extremity deep venous systems from the level of the common femoral vein and including the common femoral, femoral, profunda femoral, popliteal and calf veins including the posterior tibial, peroneal and gastrocnemius veins when visible. The superficial great saphenous vein was also interrogated. Spectral Doppler was utilized to evaluate flow at rest and with distal augmentation maneuvers in the common femoral, femoral and popliteal veins. COMPARISON:  None. FINDINGS: Contralateral Common Femoral Vein: Respiratory phasicity is normal and symmetric with the symptomatic side. No evidence of thrombus. Normal compressibility. Common Femoral Vein: No evidence of thrombus. Normal compressibility, respiratory phasicity and response to augmentation. Saphenofemoral Junction: No evidence of thrombus. Normal compressibility and flow on color Doppler imaging. Profunda Femoral  Vein: No evidence of thrombus. Normal compressibility and flow on color Doppler imaging. Femoral Vein: Nonocclusive thrombus within the mid right femoral vein. Vein is noncompressible at the mid segment. Popliteal Vein: No evidence of thrombus. Normal compressibility, respiratory phasicity and response to augmentation. Calf Veins: Occlusive thrombus present within the peroneal and posterior tibial veins. Veins are noncompressible. Anterior tibial vein appears grossly patent. Superficial Great Saphenous Vein: No evidence of thrombus. Normal compressibility and flow on color Doppler imaging. Venous Reflux:  None. Other Findings:  None. IMPRESSION: Positive for nonocclusive acute DVT involving the mid right femoral vein. Occlusive below the knee thrombus involving the posterior tibial and peroneal veins. Critical Value/emergent results were called by telephone at the time of interpretation on 03/24/2016 at 6:48 pm to Dr. Brantley Stage , who verbally acknowledged these results. Electronically Signed   By: Donavan Foil M.D.   On: 03/24/2016 18:48

## 2016-03-28 NOTE — ED Provider Notes (Signed)
[facility] DEPT Provider Note   CSN: [id number] Arrival date & time: 03/28/16  1621     History   Chief Complaint Chief Complaint  Patient presents with  . Shortness of Breath  . Fatigue    HPI Lindsay Salazar is a 66 y.o. female.  HPI  Pt was seen at [place]. Per pt, c/o gradual onset and worsening of persistent generalized weakness/fatigue for the past 1 week. Has been associated with SOB, cough, "racing HR," and subjective home fevers (described as "feeling warm"). Pt states her symptoms worsen when she ambulates at home. Pt has not been wearing her home O2 consistently. Pt was evaluated in the ED 4 days ago, dx DVT, and rx Xarelto. Pt was also dx with possible pneumonia via CT scan and rx levaquin. LD chemo 03/26/16. Pt was evaluated by her Cards MD today, then sent to the ED for further evaluation of her symptoms. Endorses compliance with xarelto. Denies CP, no abd pain, no N/V/D, no rash, no focal motor weakness.    Past Medical History:  Diagnosis Date  . Anginal pain ([place])    thought she was having   heartburn  . Arthritis    bursitis , fibromyalgia  . Arthritis    "right hip" (11/27/2015)  . Asthma   . Atrial fibrillation ([place])   . Chronic bronchitis ([place])   . Chronic right hip pain   . COPD (chronic obstructive pulmonary disease) ([place])    wears home o2 prn  . Daily headache    "last 2-3 months" (11/27/2015)  . Exposure to TB    "mom had it when she was pregnant with me"  . Fibromyalgia   . GERD (gastroesophageal reflux disease)    use to have it but not now  . Heart murmur    49-  26 years old  . Hypertension   . On home oxygen therapy    11/27/2015 "whenever I need it; don't know how many liters"  . Pancreatic cancer ([place])   . Pneumonia    "several times" (11/27/2015)  . Pneumothorax     Patient Active Problem List   Diagnosis Date Noted  . COPD exacerbation ([place]) 02/29/2016  . Rectal bleed 02/17/2016  . AKI (acute kidney injury) ([place]) 02/17/2016  .  Chronic respiratory failure with hypoxia ([place]) 01/29/2016  . Malnutrition of moderate degree 01/29/2016  . Atrial fibrillation with rapid ventricular response ([place]) 01/28/2016  . Hypertension 01/28/2016  . Anxiety 01/28/2016  . Iron deficiency anemia 01/24/2016  . B12 deficiency 01/24/2016  . COPD with acute exacerbation ([place]) 01/20/2016  . Pancreatic cancer ([place]) 12/07/2015  . Epigastric pain   . Abdominal pain 11/27/2015  . Hypokalemia 11/27/2015  . Leukocytosis 11/27/2015  . Chronic anticoagulation-Xarelto 09/06/2015  . Gastrointestinal hemorrhage with melena 08/08/2015  . Acute blood loss anemia 08/08/2015  . Chronic obstructive pulmonary disease (COPD) ([place]) 08/08/2015  . Chest pain 08/08/2015  . Hyperglycemia 07/17/2015  . Anemia 07/17/2015  . Atrial fibrillation ([place]) 07/17/2015  . Hypertensive urgency 06/27/2015  . Tobacco abuse 06/27/2015  . Elevated troponin 06/27/2015  . CAP (community acquired pneumonia)   . OSTEOPOROSIS 04/10/2009  . WEIGHT LOSS 04/10/2009    Past Surgical History:  Procedure Laterality Date  . ANTERIOR CERVICAL DECOMP/DISCECTOMY FUSION     "put 4 screws in"  . BACK SURGERY    . CARDIAC CATHETERIZATION  08/2004   Archie Endo 06/18/2010  . CARDIOVERSION N/A 07/18/2015   Procedure: CARDIOVERSION;  Surgeon: Josue Hector, MD;  Location: [facility];  Service: Cardiovascular;  Laterality: N/A;  . ESOPHAGOGASTRODUODENOSCOPY N/A 08/09/2015   Procedure: ESOPHAGOGASTRODUODENOSCOPY (EGD);  Surgeon: Rogene Houston, MD;  Location: [facility] END[facility] SUITE;  Service: Endoscopy;  Laterality: N/A;  . EUS N/A 11/29/2015   Procedure: UPPER ENDOSCOPIC ULTRASOUND (EUS) RADIAL;  Surgeon: Milus Banister, MD;  Location: [facility] ENDOSCOPY;  Service: Endoscopy;  Laterality: N/A;  . IR GENERIC HISTORICAL  12/18/2015   IR US GUIDE VASC ACCESS RIGHT 12/18/2015 Sandi Mariscal, MD [facility]  . IR GENERIC HISTORICAL  12/18/2015   IR FLUORO GUIDE PORT INSERTION RIGHT 12/18/2015 Sandi Mariscal,  MD [facility]  . TONSILLECTOMY    . TUBAL LIGATION      OB History    Gravida Para Term Preterm AB Living   2         2   SAB TAB Ectopic Multiple Live Births                   Home Medications    Prior to Admission medications   Medication Sig Start Date End Date Taking? Authorizing Provider  COMBIVENT RESPIMAT 20-100 MCG/ACT AERS respimat Inhale 1 puff into the lungs 2 (two) times daily. 12/03/15   Ripudeep Krystal Eaton, MD  diltiazem (CARDIZEM CD) 360 MG 24 hr capsule Take 1 capsule (360 mg total) by mouth daily. 02/06/16   Kathie Dike, MD  escitalopram (LEXAPRO) 10 MG tablet Take 1 tablet (10 mg total) by mouth every evening. 12/03/15   Ripudeep Krystal Eaton, MD  Gemcitabine HCl (GEMZAR IV) Inject into the vein. Day 1, day 8, day 15, every 28 days    Historical Provider, MD  guaiFENesin (MUCINEX) 600 MG 12 hr tablet Take 1 tablet (600 mg total) by mouth 2 (two) times daily. 03/02/16   Kathie Dike, MD  HYDROcodone-acetaminophen (NORCO/VICODIN) 5-325 MG tablet Take 1 tablet by mouth every 4 (four) hours as needed for moderate pain (Must last 14 days.Do not take and drive a car or use machinery.). 03/11/16   Sanjuana Kava, MD  ipratropium (ATROVENT) 0.02 % nebulizer solution Take 2.5 mLs (0.5 mg total) by nebulization 3 (three) times daily. 01/23/16   Orvan Falconer, MD  ipratropium-albuterol (DUONEB) 0.5-2.5 (3) MG/3ML SOLN Inhale 3 mLs into the lungs every 6 (six) hours as needed (for shortness of breath).  03/03/16   Historical Provider, MD  levalbuterol (XOPENEX) 1.25 MG/0.5ML nebulizer solution Take 1.25 mg by nebulization every 4 (four) hours as needed for wheezing or shortness of breath. 02/05/16   Kathie Dike, MD  levofloxacin (LEVAQUIN) 500 MG tablet Take 1.5 tablets (750 mg total) by mouth daily. 03/24/16   Forde Dandy, MD  levofloxacin (LEVAQUIN) 750 MG tablet Take 1 tablet (750 mg total) by mouth daily. 03/02/16   Kathie Dike, MD  metoprolol tartrate (LOPRESSOR) 50 MG tablet Take 1  tablet (50 mg total) by mouth 2 (two) times daily. 03/02/16   Kathie Dike, MD  nicotine (NICODERM CQ - DOSED IN MG/24 HOURS) 14 mg/24hr patch Place 1 patch (14 mg total) onto the skin daily. 12/03/15   Ripudeep Krystal Eaton, MD  ondansetron (ZOFRAN) 8 MG tablet Take 1 tablet by mouth daily as needed for nausea/vomiting. 12/10/15   Historical Provider, MD  OXYGEN Inhale 2 L into the lungs daily as needed (for breathing).    Historical Provider, MD  PACLitaxel Protein-Bound Part (ABRAXANE IV) Inject into the vein. Day 1, day 8, day 15, every 28 days    Historical Provider,  MD  pantoprazole (PROTONIX) 40 MG tablet Take 1 tablet (40 mg total) by mouth daily. 12/03/15   Ripudeep Krystal Eaton, MD  potassium chloride SA (K-DUR,KLOR-CON) 20 MEQ tablet Take 2 tablets (40 mEq total) by mouth daily. 02/27/16   Baird Cancer, PA-C  prochlorperazine (COMPAZINE) 10 MG tablet Take 1 tablet by mouth daily as needed for nausea/vomiting. 12/10/15   Historical Provider, MD  rivaroxaban (XARELTO) 20 MG TABS tablet Take 1 tablet (20 mg total) by mouth daily with supper. 07/19/15   Reyne Dumas, MD  Rivaroxaban 15 & 20 MG TBPK Take 15 mg by mouth 2 (two) times daily. Take as directed on package: Start with one 15mg  tablet by mouth twice a day with food. On Day 22, switch to one 20mg  tablet once a day with food. 03/24/16   Forde Dandy, MD  SYMBICORT 160-4.5 MCG/ACT inhaler Inhale 1 puff into the lungs 2 (two) times daily. 12/03/15   Ripudeep Krystal Eaton, MD    Family History Family History  Problem Relation Age of Onset  . COPD Mother   . Diabetes Father   . Stroke Father     Social History Social History  Substance Use Topics  . Smoking status: Former Smoker    Packs/day: 0.50    Years: 46.00    Types: Cigarettes    Start date: 10/09/1969    Quit date: 11/29/2015  . Smokeless tobacco: Never Used  . Alcohol use No     Comment: quit 30 years ago     Allergies   Ibuprofen; Other; Penicillins; Tiotropium bromide monohydrate;  and Tramadol   Review of Systems Review of Systems ROS: Statement: All systems negative except as marked or noted in the HPI; Constitutional: +generalized weakness/fatigue, subject fever, ie: "feeling hot." ; ; Eyes: Negative for eye pain, redness and discharge. ; ; ENMT: Negative for ear pain, hoarseness, nasal congestion, sinus pressure and sore throat. ; ; Cardiovascular: +"racing HR." Negative for chest pain, diaphoresis, and peripheral edema. ; ; Respiratory: +cough. Negative for wheezing and stridor. ; ; Gastrointestinal: Negative for nausea, vomiting, diarrhea, abdominal pain, blood in stool, hematemesis, jaundice and rectal bleeding. . ; ; Genitourinary: Negative for dysuria, flank pain and hematuria. ; ; Musculoskeletal: Negative for back pain and neck pain. Negative for swelling and trauma.; ; Skin: Negative for pruritus, rash, abrasions, blisters, bruising and skin lesion.; ; Neuro: Negative for headache, lightheadedness and neck stiffness. Negative for altered level of consciousness, altered mental status, extremity weakness, paresthesias, involuntary movement, seizure and syncope.     Physical Exam Updated Vital Signs BP (!) 127/102   Pulse 81   Temp 98.9 F (37.2 C) (Oral)   Resp 24   Ht 5\' 4"  (1.626 m)   Wt 125 lb (56.7 kg)   SpO2 100%   BMI 21.46 kg/m   17:05 Orthostatic Vital Signs JM  Orthostatic Lying   BP- Lying: 115/64  Pulse- Lying: 84      Orthostatic Sitting  BP- Sitting: 110/80  Pulse- Sitting: 81      Orthostatic Standing at 0 minutes  BP- Standing at 0 minutes: 97/59  Pulse- Standing at 0 minutes: 93     Physical Exam 1650: Physical examination:  Nursing notes reviewed; Vital signs and O2 SAT reviewed;  Constitutional: Well developed, Well nourished, Well hydrated, In no acute distress; Head:  Normocephalic, atraumatic; Eyes: EOMI, PERRL, No scleral icterus; ENMT: Mouth and pharynx normal, Mucous membranes moist; Neck: Supple, Full range of motion,  No  lymphadenopathy; Cardiovascular: Irregular rate and rhythm, No gallop; Respiratory: Breath sounds clear & equal bilaterally, No wheezes.  Speaking full sentences with ease, Normal respiratory effort/excursion; Chest: Nontender, Movement normal; Abdomen: Soft, Nontender, Nondistended, Normal bowel sounds; Genitourinary: No CVA tenderness; Extremities: Pulses normal, No tenderness, No edema, No calf edema or asymmetry.; Neuro: AA&Ox3, Major CN grossly intact.  Speech clear. No gross focal motor or sensory deficits in extremities.; Skin: Color normal, Warm, Dry.   ED Treatments / Results  Labs (all labs ordered are listed, but only abnormal results are displayed)   EKG  EKG Interpretation  Date/Time:  Friday March 28 2016 16:39:41 EST Ventricular Rate:  89 PR Interval:    QRS Duration: 108 QT Interval:  422 QTC Calculation: 493 R Axis:   -76 Text Interpretation:  Atrial fibrillation Left axis deviation LVH with secondary repolarization abnormality Inferior infarct, age indeterminate Anterior infarct, old Baseline wander When compared with ECG of 03/24/2016 QT has shortened Confirmed by [facility]  MD, Nunzio Cory [phone]) on 03/28/2016 4:55:06 PM       Radiology   Procedures Procedures (including critical care time)  Medications Ordered in ED Medications - No data to display   Initial Impression / Assessment and Plan / ED Course  I have reviewed the triage vital signs and the nursing notes.  Pertinent labs & imaging results that were available during my care of the patient were reviewed by me and considered in my medical decision making (see chart for details).  MDM Reviewed: previous chart, nursing note and vitals Reviewed previous: labs, ECG and CT scan Interpretation: labs, ECG and x-ray   Results for orders placed or performed during the hospital encounter of [id number]  Basic metabolic panel  Result Value Ref Range   Sodium 131 (L) 135 - 145 mmol/L   Potassium 4.4 3.5 -  5.1 mmol/L   Chloride 97 (L) 101 - 111 mmol/L   CO2 25 22 - 32 mmol/L   Glucose, Bld 102 (H) 65 - 99 mg/dL   BUN 16 6 - 20 mg/dL   Creatinine, Ser 1.01 (H) 0.44 - 1.00 mg/dL   Calcium 8.4 (L) 8.9 - 10.3 mg/dL   GFR calc non Af Amer 56 (L) >60 mL/min   GFR calc Af Amer >60 >60 mL/min   Anion gap 9 5 - 15  Troponin I  Result Value Ref Range   Troponin I <0.03 <0.03 ng/mL  Lactic acid, plasma  Result Value Ref Range   Lactic Acid, Venous 2.3 (HH) 0.5 - 1.9 mmol/L  Lactic acid, plasma  Result Value Ref Range   Lactic Acid, Venous 1.8 0.5 - 1.9 mmol/L  CBC with Differential  Result Value Ref Range   WBC 13.0 (H) 4.0 - 10.5 K/uL   RBC 3.59 (L) 3.87 - 5.11 MIL/uL   Hemoglobin 11.0 (L) 12.0 - 15.0 g/dL   HCT 34.5 (L) 36.0 - 46.0 %   MCV 96.1 78.0 - 100.0 fL   MCH 30.6 26.0 - 34.0 pg   MCHC 31.9 30.0 - 36.0 g/dL   RDW 21.0 (H) 11.5 - 15.5 %   Platelets 246 150 - 400 K/uL   Neutrophils Relative % 92 %   Neutro Abs 12.0 (H) 1.7 - 7.7 K/uL   Lymphocytes Relative 6 %   Lymphs Abs 0.8 0.7 - 4.0 K/uL   Monocytes Relative 2 %   Monocytes Absolute 0.2 0.1 - 1.0 K/uL   Eosinophils Relative 0 %   Eosinophils Absolute 0.0 0.0 - 0.7  K/uL   Basophils Relative 0 %   Basophils Absolute 0.0 0.0 - 0.1 K/uL  Urinalysis, Routine w reflex microscopic  Result Value Ref Range   Color, Urine AMBER (A) YELLOW   APPearance HAZY (A) CLEAR   Specific Gravity, Urine 1.019 1.005 - 1.030   pH 7.0 5.0 - 8.0   Glucose, UA NEGATIVE NEGATIVE mg/dL   Hgb urine dipstick SMALL (A) NEGATIVE   Bilirubin Urine NEGATIVE NEGATIVE   Ketones, ur NEGATIVE NEGATIVE mg/dL   Protein, ur 30 (A) NEGATIVE mg/dL   Nitrite NEGATIVE NEGATIVE   Leukocytes, UA LARGE (A) NEGATIVE   RBC / HPF 0-5 0 - 5 RBC/hpf   WBC, UA 6-30 0 - 5 WBC/hpf   Bacteria, UA RARE (A) NONE SEEN   Mucous PRESENT    Dg Chest 2 View Result Date: 03/28/2016 CLINICAL DATA:  66 y/o  F; weakness, shortness of breath. EXAM: CHEST  2 VIEW COMPARISON:   02/29/2016 chest radiograph FINDINGS: Normal stable cardiac silhouette. Minor stable biapical pleuroparenchymal scarring. No consolidation, effusion, or pneumothorax. Emphysema. Aortic atherosclerosis with calcification. Right port catheter with tip projecting over mid SVC. Anterior cervical fusion hardware noted. No acute osseous abnormality. IMPRESSION: Emphysema. No acute cardiopulmonary process. Aortic atherosclerosis. Electronically Signed   By: Kristine Garbe M.D.   On: 03/28/2016 17:48    Ct Angio Chest Pe W And/or Wo Contrast Result Date: 03/24/2016 CLINICAL DATA:  Right lower extremity DVT. Shortness breath and chest pain. EXAM: CT ANGIOGRAPHY CHEST WITH CONTRAST TECHNIQUE: Multidetector CT imaging of the chest was performed using the standard protocol during bolus administration of intravenous contrast. Multiplanar CT image reconstructions and MIPs were obtained to evaluate the vascular anatomy. CONTRAST:  100 mL Isovue 370 COMPARISON:  Right lower extremity DVT study 03/24/2016. Two-view chest x-ray 02/29/2016. CT of the chest 12/24/2015. FINDINGS: Cardiovascular: The heart size is normal. No significant coronary artery disease present. There is no pericardial effusion. Main pulmonary arteries are of normal size. The study is mildly degraded by patient breathing motion. No focal filling defects are present to suggest pulmonary emboli. Mediastinum/Nodes: Atherosclerotic calcifications are present in the aorta without focal stenosis or aneurysm. The left vertebral artery originates from the aorta. Distal esophageal dilation small hiatal hernia are present. Lungs/Pleura: Severe centrilobular emphysema is present. There is no focal nodule or mass lesion present. Ill-defined ground-glass attenuation is present in the upper lobes bilaterally, new since the prior exam Upper Abdomen: A 7 cm left hepatic cyst is stable. The spleen is within normal limits. Kidneys and adrenal glands are normal.  Musculoskeletal: Bone windows demonstrate exaggerated kyphosis of the thoracic spine. No acute or healing fracture is present. There is no focal lytic or blastic lesion. Cervical spine fusion is again noted. Review of the MIP images confirms the above findings. IMPRESSION: 1. No evidence for pulmonary embolus. 2. Severe centrilobular emphysema. 3. Ill-defined ground-glass attenuation in the upper lobes bilaterally likely representing edema or early infection. Atelectasis could have a similar appearance. 4. Stable hepatic cyst. 5. Atherosclerosis without aneurysm or focal stenosis of the great vessel origins. Electronically Signed   By: San Morelle M.D.   On: 03/24/2016 20:12    1850:   Pt orthostatic on VS; IVF bolus given. Pt ambulated (states she does not wear O2 at home continuously):  Pt's O2 Sats dropped to 81% R/A, HR decreased to 26-40 and pt c/o increasing SOB. Pt escorted back to stretcher with HR improving to 109 and Sats to 97% R/A. T/C to  [facility] Dr. Marily Memos, case discussed, including:  HPI, pertinent PM/SHx, VS/PE, dx testing, ED course and treatment:  Agreeable to observation admit.   1930:  2nd lactic acid improved after IVF. No clear UTI on Udip; UC pending.   Final Clinical Impressions(s) / ED Diagnoses   Final diagnoses:  None    New Prescriptions New Prescriptions   No medications on file     Francine Graven, DO 03/30/16 1530

## 2016-03-28 NOTE — ED Triage Notes (Signed)
Reports of shortness of breath x1 week and chest pain.  Reports of productive cough and fever.

## 2016-03-28 NOTE — Progress Notes (Signed)
Pharmacy Antibiotic Note  Lindsay Salazar is a 66 y.o. female admitted on 03/28/2016 with pneumonia.  Pharmacy has been consulted for Vancomycin and Cefepime dosing.  Plan: Vancomycin 1000mg  x 1 then 500mg  IV q12hrs Cefepime 1gm IV q12hrs Monitor labs, progress, c/s  Height: 5\' 4"  (162.6 cm) Weight: 125 lb (56.7 kg) IBW/kg (Calculated) : 54.7  Temp (24hrs), Avg:98.9 F (37.2 C), Min:98.9 F (37.2 C), Max:98.9 F (37.2 C)   Recent Labs Lab 03/24/16 1750 03/24/16 1812 03/26/16 0932 03/28/16 1701 03/28/16 1840  WBC 12.4*  --  14.7* 13.0*  --   CREATININE 0.96  --  1.18* 1.01*  --   LATICACIDVEN  --  2.33*  --  2.3* 1.8    Estimated Creatinine Clearance: 46.7 mL/min (by C-G formula based on SCr of 1.01 mg/dL (H)).    Allergies  Allergen Reactions  . Ibuprofen Nausea And Vomiting  . Other     Seafood   . Penicillins Swelling    Has patient had a PCN reaction causing immediate rash, facial/tongue/throat swelling, SOB or lightheadedness with hypotension: Yes Has patient had a PCN reaction causing severe rash involving mucus membranes or skin necrosis: No Has patient had a PCN reaction that required hospitalization No Has patient had a PCN reaction occurring within the last 10 years: No If all of the above answers are "NO", then may proceed with Cephalosporin use.   . Tiotropium Bromide Monohydrate Itching  . Tramadol Nausea And Vomiting    Antimicrobials this admission: Vancomycin 2/23 >>  Cefepime 2/23 >>   Dose adjustments this admission:  Microbiology results:  BCx: pending  UCx: pending   Sputum: pending   MRSA PCR: pending  Thank you for allowing pharmacy to be a part of this patient's care.  [name] A 03/28/2016 8:05 PM

## 2016-03-28 NOTE — ED Notes (Signed)
Dr Thurnell Garbe aware of lactic acid

## 2016-03-28 NOTE — ED Notes (Signed)
Pt ambulated on room air.. Pt reports that she does not ambulate with oxygen on. O2 sats decrease to 81 % and HR fluctuated from 26-45. Once back into room and pt sitting HR up to 107 and sat 97%. Pt very SOB with ambulating

## 2016-03-29 DIAGNOSIS — C257 Malignant neoplasm of other parts of pancreas: Secondary | ICD-10-CM | POA: Diagnosis not present

## 2016-03-29 DIAGNOSIS — I10 Essential (primary) hypertension: Secondary | ICD-10-CM | POA: Diagnosis not present

## 2016-03-29 DIAGNOSIS — J189 Pneumonia, unspecified organism: Secondary | ICD-10-CM | POA: Diagnosis not present

## 2016-03-29 DIAGNOSIS — Z7901 Long term (current) use of anticoagulants: Secondary | ICD-10-CM | POA: Diagnosis not present

## 2016-03-29 DIAGNOSIS — D638 Anemia in other chronic diseases classified elsewhere: Secondary | ICD-10-CM | POA: Diagnosis present

## 2016-03-29 DIAGNOSIS — D72829 Elevated white blood cell count, unspecified: Secondary | ICD-10-CM | POA: Diagnosis not present

## 2016-03-29 DIAGNOSIS — J441 Chronic obstructive pulmonary disease with (acute) exacerbation: Secondary | ICD-10-CM | POA: Diagnosis not present

## 2016-03-29 DIAGNOSIS — I4891 Unspecified atrial fibrillation: Secondary | ICD-10-CM | POA: Diagnosis not present

## 2016-03-29 DIAGNOSIS — J9621 Acute and chronic respiratory failure with hypoxia: Secondary | ICD-10-CM | POA: Diagnosis not present

## 2016-03-29 LAB — RESPIRATORY PANEL BY PCR
Adenovirus: NOT DETECTED
BORDETELLA PERTUSSIS-RVPCR: NOT DETECTED
CHLAMYDOPHILA PNEUMONIAE-RVPPCR: NOT DETECTED
Coronavirus 229E: NOT DETECTED
Coronavirus HKU1: NOT DETECTED
Coronavirus NL63: NOT DETECTED
Coronavirus OC43: NOT DETECTED
INFLUENZA A-RVPPCR: NOT DETECTED
Influenza B: NOT DETECTED
Metapneumovirus: NOT DETECTED
Mycoplasma pneumoniae: NOT DETECTED
PARAINFLUENZA VIRUS 3-RVPPCR: NOT DETECTED
PARAINFLUENZA VIRUS 4-RVPPCR: NOT DETECTED
Parainfluenza Virus 1: NOT DETECTED
Parainfluenza Virus 2: NOT DETECTED
RESPIRATORY SYNCYTIAL VIRUS-RVPPCR: NOT DETECTED
RHINOVIRUS / ENTEROVIRUS - RVPPCR: NOT DETECTED

## 2016-03-29 LAB — INFLUENZA PANEL BY PCR (TYPE A & B)
Influenza A By PCR: NEGATIVE
Influenza B By PCR: NEGATIVE

## 2016-03-29 LAB — STREP PNEUMONIAE URINARY ANTIGEN: Strep Pneumo Urinary Antigen: NEGATIVE

## 2016-03-29 MED ORDER — SODIUM CHLORIDE 0.9% FLUSH
10.0000 mL | INTRAVENOUS | Status: DC | PRN
  Administered 2016-03-30: 20 mL
  Filled 2016-03-29: qty 40

## 2016-03-29 MED ORDER — IPRATROPIUM-ALBUTEROL 0.5-2.5 (3) MG/3ML IN SOLN
3.0000 mL | Freq: Four times a day (QID) | RESPIRATORY_TRACT | Status: DC | PRN
  Administered 2016-03-29: 3 mL via RESPIRATORY_TRACT
  Filled 2016-03-29: qty 3

## 2016-03-29 MED ORDER — IPRATROPIUM-ALBUTEROL 0.5-2.5 (3) MG/3ML IN SOLN
3.0000 mL | Freq: Three times a day (TID) | RESPIRATORY_TRACT | Status: DC
  Administered 2016-03-29 – 2016-04-04 (×18): 3 mL via RESPIRATORY_TRACT
  Filled 2016-03-29 (×19): qty 3

## 2016-03-29 MED ORDER — IPRATROPIUM-ALBUTEROL 0.5-2.5 (3) MG/3ML IN SOLN
3.0000 mL | RESPIRATORY_TRACT | Status: DC | PRN
  Administered 2016-03-29 – 2016-04-01 (×4): 3 mL via RESPIRATORY_TRACT
  Filled 2016-03-29 (×4): qty 3

## 2016-03-29 MED ORDER — IPRATROPIUM-ALBUTEROL 0.5-2.5 (3) MG/3ML IN SOLN
RESPIRATORY_TRACT | Status: AC
  Administered 2016-03-29: 3 mL
  Filled 2016-03-29: qty 3

## 2016-03-29 MED ORDER — CALCIUM CARBONATE ANTACID 500 MG PO CHEW
1.0000 | CHEWABLE_TABLET | Freq: Three times a day (TID) | ORAL | Status: DC | PRN
  Administered 2016-03-31: 200 mg via ORAL
  Filled 2016-03-29 (×2): qty 1

## 2016-03-29 MED ORDER — SODIUM CHLORIDE 0.9% FLUSH
10.0000 mL | Freq: Two times a day (BID) | INTRAVENOUS | Status: DC
  Administered 2016-03-29 – 2016-04-04 (×9): 10 mL

## 2016-03-29 MED ORDER — IPRATROPIUM-ALBUTEROL 0.5-2.5 (3) MG/3ML IN SOLN: 3.0000 mL | RESPIRATORY_TRACT | Status: DC | PRN

## 2016-03-29 NOTE — Progress Notes (Signed)
Noted patients respiratory panel came back negative, droplet precautions discontinued.

## 2016-03-29 NOTE — Progress Notes (Signed)
Patient stable overnight. Complaints of pain addressed. Patient reported no respiratory distress. Patient in bed resting.

## 2016-03-29 NOTE — Progress Notes (Signed)
Per pt., does not use Dulera, has in purse, doesn't help at all.

## 2016-03-29 NOTE — Progress Notes (Signed)
Patient stable during 7 a to 7 pm shift, does continue to c/o of feeling weak and short of breath intermittently.  Patient on 2 liters, maintaining oxygen saturation in the 90's.  Coughing up minimal phlegm.  C/o chronic pain in right hip, receiving Hydrocodone as per order.

## 2016-03-29 NOTE — Progress Notes (Signed)
Patient ID: Lindsay Salazar, female   DOB: Sep 01, 1950, 66 y.o.   MRN: [id number]  PROGRESS NOTE    Lindsay Salazar  [id number]:[id number] DOB: October 14, 1950 DOA: 03/28/2016  PCP: Lindsay Salazar   Brief Narrative:  66 y.o. female with medical history significant for ongoing chemotherapy for pancreatic cancer (last chemotherapy was 03/26/2016), atrial fibrillation on xarelto, COPD on oxygen at nighttime, fibromyalgia, hypertension. Patient seen in the ED 4 days prior to the admission and was diagnosed with possible early pneumonia and was started on by mouth Levaquin. Pt presented to [facility] with one-week history of worsening fatigue, generalized weakness and subjective fevers. Patient also reported intermittent palpitations. In ED, pt was hemodynamically stable. Oxygen saturation was 90% on [place] oxygen support. Blood work showed WBC count 13, hemoglobin 11, sodium 131, Cr 1.01, lactic acid 2.3 and then 1.8. CXR showed emphysema but no acute cardio pulmonary process.   Assessment & Plan:  Principal Problem:   Acute on chronic respiratory failure with hypoxia (HCC) /Acute COPD exacerbation  - Likely from acute COPD exacerbation and HCAP - Continue current BD: duoneb nebulizer TID scheduled and every 4 hours PRN shortness of breath or wheezing - Continue oxygen support via [place] to keep O2 saturation above 90%  Active Problems:   Sepsis / HCAP (healthcare-associated pneumonia) / Leukocytosis - Sepsis criteria met on admission with tachypnea, tachycardia, hypoxia, leukocytosis, lactic acidosis, mild procalcitonin elevation - Recently seen in ED and treated for early pneumonia with Levaquin - Started on broad spectrum Abx: cefepime and vanco for possible HCAP considering she is immunocompromised - Resp virus panel is negative - Blood cx negative so far - Strep pneumonia is negative - Sputum cx pending     Atrial fibrillation (HCC) / Chronic anticoagulation-Xarelto - CHADS vasc score 4 - On AC with xarelto - Rate  controlled with Cardizem and metoproll     Pancreatic cancer ([place]) - Oncology follow up outpt    Anemia of chronic disease - Due to history of malignancy and sequela of chemotherapy - Continue to monitor daily CBC    Essential hypertension - Continue Cardizem 360 mg daily and metoprolol 50 mg po BID   DVT prophylaxis: On Xarelto  Code Status: full code  Family Communication: no family at bedside   Disposition Plan: home once resp status stable    Consultants:   PT  Procedures:   None   Antimicrobials:   Vanco and cefepime 03/28/2016 -->   Subjective: Still feels short of breath this am.   Objective: Vitals:   03/29/16 1013 03/29/16 1043 03/29/16 1300 03/29/16 1536  BP:   120/68   Pulse: 74  (!) 55   Resp:   16   Temp:   98 F (36.7 C)   TempSrc:   Oral   SpO2:  97% 97% 97%  Weight:      Height:        Intake/Output Summary (Last 24 hours) at 03/29/16 1547 Last data filed at 03/29/16 1300  Gross per 24 hour  Intake              880 ml  Output              600 ml  Net              280 ml   Filed Weights   03/28/16 1636 03/28/16 2208 03/29/16 0500  Weight: 56.7 kg (125 lb) 58.6 kg (129 lb 1.6 oz) 57 kg (125 lb 9.6  oz)    Examination:  General exam: Appears calm and comfortable  Respiratory system: Rhonchorous throughout, on [place] Cardiovascular system: S1 & S2 heard, Rate controlled  Gastrointestinal system: Abdomen is nondistended, soft and nontender. No organomegaly or masses felt. Normal bowel sounds heard. Central nervous system: Alert and oriented. No focal neurological deficits. Extremities: Symmetric 5 x 5 power. Ecchymoses upper bilateral extremities  Skin: No rashes, lesions or ulcers Psychiatry: Judgement and insight appear normal. Mood & affect appropriate.   Data Reviewed: I have personally reviewed following labs and imaging studies  CBC:  Recent Labs Lab 03/24/16 1750 03/26/16 0932 03/28/16 1701  WBC 12.4* 14.7* 13.0*  NEUTROABS  8.9* 10.8* 12.0*  HGB 11.7* 12.3 11.0*  HCT 36.5 39.5 34.5*  MCV 95.3 95.2 96.1  PLT 372 406* 641   Basic Metabolic Panel:  Recent Labs Lab 03/24/16 1750 03/26/16 0932 03/28/16 1701  NA 138 135 131*  K 3.0* 2.4* 4.4  CL 101 93* 97*  CO2 '28 29 25  ' GLUCOSE 83 174* 102*  BUN '13 13 16  ' CREATININE 0.96 1.18* 1.01*  CALCIUM 8.6* 7.9* 8.4*   GFR: Estimated Creatinine Clearance: 46.7 mL/min (by C-G formula based on SCr of 1.01 mg/dL (H)). Liver Function Tests:  Recent Labs Lab 03/26/16 0932  AST 26  ALT 22  ALKPHOS 74  BILITOT 0.6  PROT 5.9*  ALBUMIN 3.1*   No results for input(s): LIPASE, AMYLASE in the last 168 hours. No results for input(s): AMMONIA in the last 168 hours. Coagulation Profile: No results for input(s): INR, PROTIME in the last 168 hours. Cardiac Enzymes:  Recent Labs Lab 03/28/16 1701  TROPONINI <0.03   BNP (last 3 results) No results for input(s): PROBNP in the last 8760 hours. HbA1C: No results for input(s): HGBA1C in the last 72 hours. CBG: No results for input(s): GLUCAP in the last 168 hours. Lipid Profile: No results for input(s): CHOL, HDL, LDLCALC, TRIG, CHOLHDL, LDLDIRECT in the last 72 hours. Thyroid Function Tests: No results for input(s): TSH, T4TOTAL, FREET4, T3FREE, THYROIDAB in the last 72 hours. Anemia Panel: No results for input(s): VITAMINB12, FOLATE, FERRITIN, TIBC, IRON, RETICCTPCT in the last 72 hours. Urine analysis:    Component Value Date/Time   COLORURINE AMBER (A) 03/28/2016 1841   APPEARANCEUR HAZY (A) 03/28/2016 1841   LABSPEC 1.019 03/28/2016 1841   PHURINE 7.0 03/28/2016 1841   GLUCOSEU NEGATIVE 03/28/2016 1841   HGBUR SMALL (A) 03/28/2016 1841   BILIRUBINUR NEGATIVE 03/28/2016 [place] NEGATIVE 03/28/2016 1841   PROTEINUR 30 (A) 03/28/2016 1841   NITRITE NEGATIVE 03/28/2016 1841   LEUKOCYTESUR LARGE (A) 03/28/2016 1841   Sepsis Labs: '@LABRCNTIP' (procalcitonin:4,lacticidven:4)   Recent Results  (from the past 240 hour(s))  Respiratory Panel by PCR     Status: None   Collection Time: 03/28/16  7:38 PM  Result Value Ref Range Status   Adenovirus NOT DETECTED NOT DETECTED Final   Coronavirus 229E NOT DETECTED NOT DETECTED Final   Coronavirus HKU1 NOT DETECTED NOT DETECTED Final   Coronavirus NL63 NOT DETECTED NOT DETECTED Final   Coronavirus OC43 NOT DETECTED NOT DETECTED Final   Metapneumovirus NOT DETECTED NOT DETECTED Final   Rhinovirus / Enterovirus NOT DETECTED NOT DETECTED Final   Influenza A NOT DETECTED NOT DETECTED Final   Influenza B NOT DETECTED NOT DETECTED Final   Parainfluenza Virus 1 NOT DETECTED NOT DETECTED Final   Parainfluenza Virus 2 NOT DETECTED NOT DETECTED Final   Parainfluenza Virus 3 NOT DETECTED  NOT DETECTED Final   Parainfluenza Virus 4 NOT DETECTED NOT DETECTED Final   Respiratory Syncytial Virus NOT DETECTED NOT DETECTED Final   Bordetella pertussis NOT DETECTED NOT DETECTED Final   Chlamydophila pneumoniae NOT DETECTED NOT DETECTED Final   Mycoplasma pneumoniae NOT DETECTED NOT DETECTED Final    Comment: Performed at [facility], [place] [address], [place], [place] [address]  Culture, blood (routine x 2) Call MD if unable to obtain prior to antibiotics being given     Status: None (Preliminary result)   Collection Time: 03/28/16  8:17 PM  Result Value Ref Range Status   Specimen Description BLOOD  Final   Special Requests NONE  Final   Culture NO GROWTH < 24 HOURS  Final   Report Status PENDING  Incomplete  Culture, blood (routine x 2) Call MD if unable to obtain prior to antibiotics being given     Status: None (Preliminary result)   Collection Time: 03/28/16  8:25 PM  Result Value Ref Range Status   Specimen Description BLOOD  Final   Special Requests NONE  Final   Culture NO GROWTH < 24 HOURS  Final   Report Status PENDING  Incomplete      Radiology Studies: Dg Chest 2 View Result Date: 03/28/2016 Emphysema. No acute cardiopulmonary  process. Aortic atherosclerosis.    Scheduled Meds: . ceFEPime   1 g Intravenous Q12H  . diltiazem  360 mg Oral Daily  . escitalopram  10 mg Oral QPM  . guaiFENesin  1,200 mg Oral BID  . ipratropium-albutero  3 mL Nebulization TID  . metoprolol  50 mg Oral   . mometasone-formo  2 puff InhalationBID BID  . pantoprazole  40 mg Oral Daily  . potassium chloride  40 mEq Oral Daily  . rivaroxaban  20 mg Oral Q supper  . vancomycin  500 mg Intravenous Q12H   Continuous Infusions: . sodium chloride 75 mL/hr at 03/28/16 2334     LOS: 0 days    Time spent: 25 minutes  Greater than 50% of the time spent on counseling and coordinating the care.   Leisa Lenz, MD [facility] Pager [phone]  If 7PM-7AM, please contact night-coverage [email or web address] Password [facility] 03/29/2016, 3:47 PM

## 2016-03-30 DIAGNOSIS — J441 Chronic obstructive pulmonary disease with (acute) exacerbation: Secondary | ICD-10-CM | POA: Diagnosis present

## 2016-03-30 DIAGNOSIS — R001 Bradycardia, unspecified: Secondary | ICD-10-CM | POA: Diagnosis present

## 2016-03-30 DIAGNOSIS — I4891 Unspecified atrial fibrillation: Secondary | ICD-10-CM | POA: Diagnosis not present

## 2016-03-30 DIAGNOSIS — K219 Gastro-esophageal reflux disease without esophagitis: Secondary | ICD-10-CM | POA: Diagnosis present

## 2016-03-30 DIAGNOSIS — Z981 Arthrodesis status: Secondary | ICD-10-CM | POA: Diagnosis not present

## 2016-03-30 DIAGNOSIS — E872 Acidosis: Secondary | ICD-10-CM | POA: Diagnosis present

## 2016-03-30 DIAGNOSIS — C259 Malignant neoplasm of pancreas, unspecified: Secondary | ICD-10-CM | POA: Diagnosis present

## 2016-03-30 DIAGNOSIS — M797 Fibromyalgia: Secondary | ICD-10-CM | POA: Diagnosis present

## 2016-03-30 DIAGNOSIS — F329 Major depressive disorder, single episode, unspecified: Secondary | ICD-10-CM | POA: Diagnosis present

## 2016-03-30 DIAGNOSIS — J9621 Acute and chronic respiratory failure with hypoxia: Secondary | ICD-10-CM | POA: Diagnosis not present

## 2016-03-30 DIAGNOSIS — E860 Dehydration: Secondary | ICD-10-CM | POA: Diagnosis present

## 2016-03-30 DIAGNOSIS — R0902 Hypoxemia: Secondary | ICD-10-CM | POA: Diagnosis present

## 2016-03-30 DIAGNOSIS — M810 Age-related osteoporosis without current pathological fracture: Secondary | ICD-10-CM | POA: Diagnosis present

## 2016-03-30 DIAGNOSIS — J440 Chronic obstructive pulmonary disease with acute lower respiratory infection: Secondary | ICD-10-CM | POA: Diagnosis present

## 2016-03-30 DIAGNOSIS — D638 Anemia in other chronic diseases classified elsewhere: Secondary | ICD-10-CM | POA: Diagnosis present

## 2016-03-30 DIAGNOSIS — J189 Pneumonia, unspecified organism: Secondary | ICD-10-CM | POA: Diagnosis present

## 2016-03-30 DIAGNOSIS — E871 Hypo-osmolality and hyponatremia: Secondary | ICD-10-CM | POA: Diagnosis present

## 2016-03-30 DIAGNOSIS — Z7901 Long term (current) use of anticoagulants: Secondary | ICD-10-CM | POA: Diagnosis not present

## 2016-03-30 DIAGNOSIS — E875 Hyperkalemia: Secondary | ICD-10-CM | POA: Diagnosis present

## 2016-03-30 DIAGNOSIS — Z9981 Dependence on supplemental oxygen: Secondary | ICD-10-CM | POA: Diagnosis not present

## 2016-03-30 DIAGNOSIS — Z201 Contact with and (suspected) exposure to tuberculosis: Secondary | ICD-10-CM | POA: Diagnosis present

## 2016-03-30 DIAGNOSIS — A419 Sepsis, unspecified organism: Secondary | ICD-10-CM | POA: Diagnosis present

## 2016-03-30 DIAGNOSIS — N390 Urinary tract infection, site not specified: Secondary | ICD-10-CM | POA: Diagnosis present

## 2016-03-30 DIAGNOSIS — I482 Chronic atrial fibrillation: Secondary | ICD-10-CM | POA: Diagnosis present

## 2016-03-30 DIAGNOSIS — Y95 Nosocomial condition: Secondary | ICD-10-CM | POA: Diagnosis present

## 2016-03-30 DIAGNOSIS — Z825 Family history of asthma and other chronic lower respiratory diseases: Secondary | ICD-10-CM | POA: Diagnosis not present

## 2016-03-30 DIAGNOSIS — Z7951 Long term (current) use of inhaled steroids: Secondary | ICD-10-CM | POA: Diagnosis not present

## 2016-03-30 DIAGNOSIS — I10 Essential (primary) hypertension: Secondary | ICD-10-CM | POA: Diagnosis present

## 2016-03-30 LAB — CBC
HEMATOCRIT: 29.4 % — AB (ref 36.0–46.0)
Hemoglobin: 9.2 g/dL — ABNORMAL LOW (ref 12.0–15.0)
MCH: 30.1 pg (ref 26.0–34.0)
MCHC: 31.3 g/dL (ref 30.0–36.0)
MCV: 96.1 fL (ref 78.0–100.0)
PLATELETS: 182 10*3/uL (ref 150–400)
RBC: 3.06 MIL/uL — ABNORMAL LOW (ref 3.87–5.11)
RDW: 21 % — AB (ref 11.5–15.5)
WBC: 10.7 10*3/uL — AB (ref 4.0–10.5)

## 2016-03-30 LAB — BASIC METABOLIC PANEL
ANION GAP: 8 (ref 5–15)
BUN: 15 mg/dL (ref 6–20)
CALCIUM: 8.3 mg/dL — AB (ref 8.9–10.3)
CO2: 22 mmol/L (ref 22–32)
Chloride: 104 mmol/L (ref 101–111)
Creatinine, Ser: 1.07 mg/dL — ABNORMAL HIGH (ref 0.44–1.00)
GFR, EST NON AFRICAN AMERICAN: 52 mL/min — AB (ref >60)
Glucose, Bld: 115 mg/dL — ABNORMAL HIGH (ref 65–99)
Potassium: 4.9 mmol/L (ref 3.5–5.1)
SODIUM: 134 mmol/L — AB (ref 135–145)

## 2016-03-30 LAB — HIV ANTIBODY (ROUTINE TESTING W REFLEX): HIV SCREEN 4TH GENERATION: NONREACTIVE

## 2016-03-30 LAB — URINE CULTURE: Culture: <10000 — AB

## 2016-03-30 NOTE — Progress Notes (Signed)
VSS overnight. Patient still reports weakness when getting up to [facility]. Patient on 2L of O2. Sats remained in the 90's. Patient's complaints of pain addressed. Patient in bed resting.

## 2016-03-30 NOTE — Progress Notes (Signed)
Patient ID: Lindsay Salazar, female   DOB: 31-Aug-1950, 66 y.o.   MRN: [id number]  PROGRESS NOTE   [name]  FVC:[id number] DOB: 10-Dec-1950 DOA: 03/28/2016  PCP: Vesta Mixer  Brief Narrative:  66 y.o. female with medical history significant for ongoing chemotherapy for pancreatic cancer (last chemotherapy was 03/26/2016), atrial fibrillation on xarelto, COPD on oxygen at nighttime, fibromyalgia, hypertension. Patient seen in the ED 4 days prior to the admission and was diagnosed with possible early pneumonia and was started on by mouth Levaquin. Pt presented to [facility] with one-week history of worsening fatigue, generalized weakness and subjective fevers. Patient also reported intermittent palpitations.  In ED, pt was hemodynamically stable. Oxygen saturation was 90% on [place] oxygen support. Blood work showed WBC count 13, hemoglobin 11, sodium 131, Cr 1.01, lactic acid 2.3 and then 1.8. CXR showed emphysema but no acute cardio pulmonary process.   Assessment & Plan:  Principal Problem:   Acute on chronic respiratory failure with hypoxia (HCC) /Acute COPD exacerbation  - Likely from acute COPD exacerbation and HCAP - Continue current BD: duoneb nebulizer TID scheduled and every 4 hours PRN shortness of breath or wheezing - Continue oxygen support via [place] to keep O2 saturation above 90% - still not at her baseline so needs continued hospitalization   Active Problems:   Sepsis / HCAP (healthcare-associated pneumonia) / Leukocytosis - Sepsis criteria met on admission with tachypnea, tachycardia, hypoxia, leukocytosis, lactic acidosis, mild procalcitonin elevation - Recently seen in ED and treated for early pneumonia with Levaquin - Started on broad spectrum Abx: cefepime and vanco for possible HCAP considering she is immunocompromised - Resp virus panel is negative - Blood cx negative so far - Strep pneumonia is negative - Sputum cx pending  - if no fevers overnight and WBC better, consider  changing to PO ABX in AM    Atrial fibrillation (HCC) / Chronic anticoagulation-Xarelto - CHADS vasc score 4 - On AC with xarelto - Rate controlled with Cardizem and metoprolol, HR in 80's this AM    Pancreatic cancer ([place]) - Oncology follow up outpt    Anemia of chronic disease - Due to history of malignancy and sequela of chemotherapy - drop in Hg noted overnight from 11 --> 9.2 this AM, but no clear evidence of bleeding  - Continue to monitor daily CBC    Essential hypertension - Continue Cardizem 360 mg daily and metoprolol 50 mg po BID  DVT prophylaxis: On Xarelto  Code Status: full code  Family Communication: no family at bedside   Disposition Plan: home once resp status stable, possibly in 1-2 days   Consultants:   PT  Procedures:   None   Antimicrobials:   Vanco and cefepime 03/28/2016 -->  Subjective: Still feels short of breath this am.   Objective: Vitals:   03/30/16 0111 03/30/16 0642 03/30/16 0821 03/30/16 1100  BP:  115/82  121/74  Pulse:  80  82  Resp:  18  (!) 22  Temp:  98 F (36.7 C)  98.1 F (36.7 C)  TempSrc:  Oral  Oral  SpO2: 99% 92% 92% 90%  Weight:  58.2 kg (128 lb 4.9 oz)    Height:        Intake/Output Summary (Last 24 hours) at 03/30/16 1453 Last data filed at 03/30/16 0956  Gross per 24 hour  Intake             1485 ml  Output  300 ml  Net             1185 ml   Filed Weights   03/28/16 2208 03/29/16 0500 03/30/16 9021  Weight: 58.6 kg (129 lb 1.6 oz) 57 kg (125 lb 9.6 oz) 58.2 kg (128 lb 4.9 oz)    Examination:  General exam: Appears calm and comfortable  Respiratory system: Rhonchorous throughout, on [place], mild exp wheezing  Cardiovascular system: S1 & S2 heard, Rate controlled  Gastrointestinal system: Abdomen is nondistended, soft and nontender. No organomegaly or masses felt. Normal bowel sounds heard. Central nervous system: Alert and oriented. No focal neurological deficits. Extremities: Symmetric 5 x 5  power. Ecchymoses upper bilateral extremities   Data Reviewed: I have personally reviewed following labs and imaging studies  CBC:  Recent Labs Lab 03/24/16 1750 03/26/16 0932 03/28/16 1701 03/30/16 0459  WBC 12.4* 14.7* 13.0* 10.7*  NEUTROABS 8.9* 10.8* 12.0*  --   HGB 11.7* 12.3 11.0* 9.2*  HCT 36.5 39.5 34.5* 29.4*  MCV 95.3 95.2 96.1 96.1  PLT 372 406* 246 115   Basic Metabolic Panel:  Recent Labs Lab 03/24/16 1750 03/26/16 0932 03/28/16 1701 03/30/16 0459  NA 138 135 131* 134*  K 3.0* 2.4* 4.4 4.9  CL 101 93* 97* 104  CO2 _0 GLUCOSE 83 174* 102* 115*  BUN _1 CREATININE 0.96 1.18* 1.01* 1.07*  CALCIUM 8.6* 7.9* 8.4* 8.3*   Liver Function Tests:  Recent Labs Lab 03/26/16 0932  AST 26  ALT 22  ALKPHOS 74  BILITOT 0.6  PROT 5.9*  ALBUMIN 3.1*   Cardiac Enzymes:  Recent Labs Lab 03/28/16 1701  TROPONINI <0.03   Urine analysis:    Component Value Date/Time   COLORURINE AMBER (A) 03/28/2016 1841   APPEARANCEUR HAZY (A) 03/28/2016 1841   LABSPEC 1.019 03/28/2016 1841   PHURINE 7.0 03/28/2016 1841   GLUCOSEU NEGATIVE 03/28/2016 1841   HGBUR SMALL (A) 03/28/2016 1841   BILIRUBINUR NEGATIVE 03/28/2016 1841   KETONESUR NEGATIVE 03/28/2016 1841   PROTEINUR 30 (A) 03/28/2016 1841   NITRITE NEGATIVE 03/28/2016 1841   LEUKOCYTESUR LARGE (A) 03/28/2016 1841   Recent Results (from the past 240 hour(s))  Urine culture     Status: Abnormal   Collection Time: 03/28/16  6:42 PM  Result Value Ref Range Status   Specimen Description URINE, CLEAN CATCH  Final   Special Requests NONE  Final   Culture (A)  Final    <10,000 COLONIES/mL INSIGNIFICANT GROWTH Performed at [facility], [address] [place], [place] [address]    Report Status 03/30/2016 FINAL  Final  Respiratory Panel by PCR     Status: None   Collection Time: 03/28/16  7:38 PM  Result Value Ref Range Status   Adenovirus NOT DETECTED NOT DETECTED Final    Coronavirus 229E NOT DETECTED NOT DETECTED Final   Coronavirus HKU1 NOT DETECTED NOT DETECTED Final   Coronavirus NL63 NOT DETECTED NOT DETECTED Final   Coronavirus OC43 NOT DETECTED NOT DETECTED Final   Metapneumovirus NOT DETECTED NOT DETECTED Final   Rhinovirus / Enterovirus NOT DETECTED NOT DETECTED Final   Influenza A NOT DETECTED NOT DETECTED Final   Influenza B NOT DETECTED NOT DETECTED Final   Parainfluenza Virus 1 NOT DETECTED NOT DETECTED Final   Parainfluenza Virus 2 NOT DETECTED NOT DETECTED Final   Parainfluenza Virus 3 NOT DETECTED NOT DETECTED Final   Parainfluenza Virus 4 NOT DETECTED NOT DETECTED  Final   Respiratory Syncytial Virus NOT DETECTED NOT DETECTED Final   Bordetella pertussis NOT DETECTED NOT DETECTED Final   Chlamydophila pneumoniae NOT DETECTED NOT DETECTED Final   Mycoplasma pneumoniae NOT DETECTED NOT DETECTED Final    Comment: Performed at [facility], [place] [address] [place], [place] [address]  Culture, blood (routine x 2) Call MD if unable to obtain prior to antibiotics being given     Status: None (Preliminary result)   Collection Time: 03/28/16  8:17 PM  Result Value Ref Range Status   Specimen Description BLOOD  Final   Special Requests NONE  Final   Culture NO GROWTH < 24 HOURS  Final   Report Status PENDING  Incomplete  Culture, blood (routine x 2) Call MD if unable to obtain prior to antibiotics being given     Status: None (Preliminary result)   Collection Time: 03/28/16  8:25 PM  Result Value Ref Range Status   Specimen Description BLOOD  Final   Special Requests NONE  Final   Culture NO GROWTH < 24 HOURS  Final   Report Status PENDING  Incomplete     Radiology Studies: Dg Chest 2 View Result Date: 03/28/2016 Emphysema. No acute cardiopulmonary process. Aortic atherosclerosis.  Scheduled Meds: . ceFEPime   1 g Intravenous Q12H  . diltiazem  360 mg Oral Daily  . escitalopram  10 mg Oral QPM  . guaiFENesin  1,200 mg Oral BID  .  ipratropium-albutero  3 mL Nebulization TID  . metoprolol  50 mg Oral   . mometasone-formo  2 puff InhalationBID BID  . pantoprazole  40 mg Oral Daily  . potassium chloride  40 mEq Oral Daily  . rivaroxaban  20 mg Oral Q supper  . vancomycin  500 mg Intravenous Q12H   Continuous Infusions: . sodium chloride 50 mL/hr at 03/29/16 1726     LOS: 0 days   Time spent: 25 minutes  Greater than 50% of the time spent on counseling and coordinating the care.  Faye Ramsay, MD [facility] Pager [phone]  If 7PM-7AM, please contact night-coverage [email or web address] Password [facility] 03/30/2016, 2:53 PM

## 2016-03-30 NOTE — Progress Notes (Signed)
Orders written to discontinue cardiac monitoring at 2.25.18 @ 1459.  Cardiac monitoring removed and CCMD notified.

## 2016-03-30 NOTE — Evaluation (Signed)
Physical Therapy Evaluation Patient Details Name: Lindsay Salazar MRN: [id number] DOB: 19-Jan-1951 Today's Date: 03/30/2016   History of Present Illness  66 y.o.femalewith medical history significant forongoing chemotherapy for pancreatic cancer (last chemotherapy was 03/26/2016), atrialfibrillation on xarelto, COPD, fibromyalgia, hypertension; presented to [facility] with one-week history of worsening fatigue, generalized weakness and subjective fever found to haveAcute on chronic respiratory failure with hypoxia ([place]) /Acute COPD exacerbation and Sepsis / HCAP (healthcare-associated pneumonia) / Leukocytosis  Clinical Impression  Patient demonstrates deficits in functional mobility as indicated below. Will need continued skilled TP toaddress deficits and maximize function. Will see as idnicated and progress as tolerated.  OF NOTE: patient with resting saturations of 79% on 2 liters upon entering the room, increased O2 to 4 liters, patient improved to 88%, assisted to [facility] then to sink to wash hands, desaturation again to mid 70s despite increased O2. Returned to bed, with extended rest period and saturations improved to 91% on 4 liters. Nsg made aware.    Follow Up Recommendations Home health PT;Supervision/Assistance - 24 hour    Equipment Recommendations  None recommended by PT    Recommendations for Other Services       Precautions / Restrictions Precautions Precautions: Fall Precaution Comments: watch O2 saturations Restrictions Weight Bearing Restrictions: No      Mobility  Bed Mobility Overal bed mobility: Needs Assistance Bed Mobility: Supine to Sit;Sit to Supine     Supine to sit: Min assist Sit to supine: Min assist   General bed mobility comments: Min assist to elevate trunk to EOb, min assist to raise LEs back to bed  Transfers Overall transfer level: Needs assistance Equipment used: 1 person hand held assist Transfers: Sit to/from [facility] Sit to  Stand: Min guard Stand pivot transfers: Min assist       General transfer comment: min assist for stability during transfer to [facility]  Ambulation/Gait Ambulation/Gait assistance: Min assist Ambulation Distance (Feet): 24 Feet Assistive device: Rolling walker (2 wheeled) Gait Pattern/deviations: Step-through pattern;Shuffle;Drifts right/left;Trunk flexed;Narrow base of support Gait velocity: decreased   General Gait Details: reliance on RW for support, limited by extreme fatigue and desaturations on 2 liters, increased to 4 liters   Stairs            Wheelchair Mobility    Modified Rankin (Stroke Patients Only)       Balance Overall balance assessment: Needs assistance   Sitting balance-Leahy Scale: Fair       Standing balance-Leahy Scale: Poor Standing balance comment: reliance on UE support at this time, reports BLE weakness                             Pertinent Vitals/Pain Pain Assessment: 0-10 Pain Score: 8  Pain Location: back and bilateral LEs Pain Descriptors / Indicators: Aching;Discomfort;Grimacing;Guarding Pain Intervention(s): Limited activity within patient's tolerance;Monitored during session;Repositioned    Home Living Family/patient expects to be discharged to:: Private residence Living Arrangements: Spouse/significant other Available Help at Discharge: Family Type of Home: House Home Access: Stairs to enter Entrance Stairs-Rails: None Technical brewer of Steps: 4 Home Layout: One level Home Equipment: Environmental consultant - 2 wheels      Prior Function Level of Independence: Independent with assistive device(s)         Comments: uses RW to get around     Hand Dominance   Dominant Hand: Right    Extremity/Trunk Assessment   Upper Extremity Assessment Upper Extremity Assessment: Generalized  weakness    Lower Extremity Assessment Lower Extremity Assessment: Generalized weakness       Communication   Communication: No  difficulties  Cognition Arousal/Alertness: Awake/alert Behavior During Therapy: Flat affect Overall Cognitive Status: Within Functional Limits for tasks assessed                      General Comments      Exercises     Assessment/Plan    PT Assessment Patient needs continued PT services  PT Problem List Decreased strength;Decreased activity tolerance;Decreased balance;Decreased mobility;Cardiopulmonary status limiting activity;Pain       PT Treatment Interventions DME instruction;Gait training;Stair training;Functional mobility training;Therapeutic activities;Therapeutic exercise;Balance training;Patient/family education    PT Goals (Current goals can be found in the Care Plan section)  Acute Rehab PT Goals Patient Stated Goal: to get better PT Goal Formulation: With patient Time For Goal Achievement: 04/13/16 Potential to Achieve Goals: Fair    Frequency Min 3X/week   Barriers to discharge        Co-evaluation               End of Session Equipment Utilized During Treatment: Gait belt;Oxygen Activity Tolerance: Patient limited by fatigue;Treatment limited secondary to medical complications (Comment) (desaturations) Patient left: in bed;with call bell/phone within reach;with bed alarm set Nurse Communication: Mobility status;Other (comment) (decreased O2 saturations) PT Visit Diagnosis: Unsteadiness on feet (R26.81);Muscle weakness (generalized) (M62.81)    Functional Assessment Tool Used: Clinical judgement Functional Limitation: Mobility: Walking and moving around Mobility: Walking and Moving Around Current Status [name]:[id number]): At least 20 percent but less than 40 percent impaired, limited or restricted Mobility: Walking and Moving Around Goal Status [phone]): At least 1 percent but less than 20 percent impaired, limited or restricted    Time: 1201-1218 PT Time Calculation (min) (ACUTE ONLY): 17 min   Charges:   PT Evaluation $PT Eval Moderate  Complexity: 1 Procedure     PT G Codes:   PT G-Codes **NOT FOR INPATIENT CLASS** Functional Assessment Tool Used: Clinical judgement Functional Limitation: Mobility: Walking and moving around Mobility: Walking and Moving Around Current Status Belisa:[id number]): At least 20 percent but less than 40 percent impaired, limited or restricted Mobility: Walking and Moving Around Goal Status [phone]): At least 1 percent but less than 20 percent impaired, limited or restricted     Duncan Dull 03/30/2016, 3:01 PM Alben Deeds, [place] DPT  [phone]

## 2016-03-31 ENCOUNTER — Other Ambulatory Visit (HOSPITAL_COMMUNITY): Payer: Self-pay

## 2016-03-31 DIAGNOSIS — E876 Hypokalemia: Secondary | ICD-10-CM

## 2016-03-31 LAB — CBC
HCT: 28.7 % — ABNORMAL LOW (ref 36.0–46.0)
Hemoglobin: 8.8 g/dL — ABNORMAL LOW (ref 12.0–15.0)
MCH: 29.5 pg (ref 26.0–34.0)
MCHC: 30.7 g/dL (ref 30.0–36.0)
MCV: 96.3 fL (ref 78.0–100.0)
Platelets: 165 10*3/uL (ref 150–400)
RBC: 2.98 MIL/uL — ABNORMAL LOW (ref 3.87–5.11)
RDW: 20.7 % — ABNORMAL HIGH (ref 11.5–15.5)
WBC: 10.8 10*3/uL — ABNORMAL HIGH (ref 4.0–10.5)

## 2016-03-31 LAB — BASIC METABOLIC PANEL
Anion gap: 6 (ref 5–15)
BUN: 14 mg/dL (ref 6–20)
CO2: 23 mmol/L (ref 22–32)
Calcium: 8.3 mg/dL — ABNORMAL LOW (ref 8.9–10.3)
Chloride: 103 mmol/L (ref 101–111)
Creatinine, Ser: 0.83 mg/dL (ref 0.44–1.00)
GFR calc Af Amer: >60 mL/min (ref >60)
GFR calc non Af Amer: >60 mL/min (ref >60)
Glucose, Bld: 120 mg/dL — ABNORMAL HIGH (ref 65–99)
Potassium: 4.7 mmol/L (ref 3.5–5.1)
Sodium: 132 mmol/L — ABNORMAL LOW (ref 135–145)

## 2016-03-31 LAB — LEGIONELLA PNEUMOPHILA SEROGP 1 UR AG: L. pneumophila Serogp 1 Ur Ag: NEGATIVE

## 2016-03-31 LAB — VANCOMYCIN, TROUGH: VANCOMYCIN TR: 15 ug/mL (ref 15–20)

## 2016-03-31 MED ORDER — POTASSIUM CHLORIDE CRYS ER 20 MEQ PO TBCR: 40.0000 meq | EXTENDED_RELEASE_TABLET | Freq: Two times a day (BID) | ORAL | 0 refills | Status: DC

## 2016-03-31 MED ORDER — PREDNISONE 20 MG PO TABS
40.0000 mg | ORAL_TABLET | Freq: Every day | ORAL | Status: AC
  Administered 2016-03-31 – 2016-04-04 (×5): 40 mg via ORAL
  Filled 2016-03-31 (×5): qty 2

## 2016-03-31 MED ORDER — BUDESONIDE 0.25 MG/2ML IN SUSP
0.2500 mg | Freq: Two times a day (BID) | RESPIRATORY_TRACT | Status: DC
  Administered 2016-03-31 – 2016-04-04 (×8): 0.25 mg via RESPIRATORY_TRACT
  Filled 2016-03-31 (×9): qty 2

## 2016-03-31 NOTE — Telephone Encounter (Signed)
Received refill request from patients pharmacy for potassium. Reviewed with PA, chart checked and refilled.

## 2016-03-31 NOTE — Progress Notes (Signed)
Patient ID: Lindsay Salazar, female   DOB: 11-02-1950, 66 y.o.   MRN: [id number]  PROGRESS NOTE   RHEANNA SERGENT  ERD:[id number] DOB: 02-09-1950 DOA: 03/28/2016  PCP: Vesta Mixer  Brief Narrative:  66 y.o. female with medical history significant for ongoing chemotherapy for pancreatic cancer (last chemotherapy was 03/26/2016), atrial fibrillation on xarelto, COPD on oxygen at nighttime, fibromyalgia, hypertension. Patient seen in the ED 4 days prior to the admission and was diagnosed with possible early pneumonia and was started on by mouth Levaquin. Pt presented to [facility] with one-week history of worsening fatigue, generalized weakness and subjective fevers. Patient also reported intermittent palpitations.  In ED, pt was hemodynamically stable. Oxygen saturation was 90% on [place] oxygen support. Blood work showed WBC count 13, hemoglobin 11, sodium 131, Cr 1.01, lactic acid 2.3 and then 1.8. CXR showed emphysema but no acute cardio pulmonary process.   Assessment & Plan:  Principal Problem:   Acute on chronic respiratory failure with hypoxia (HCC) /Acute COPD exacerbation  - Likely from acute COPD exacerbation and HCAP - Continue current BD: duoneb nebulizer TID scheduled and every 4 hours PRN shortness of breath or wheezing. Will add pulmicort and Prednisone '40mg'$  po for 5 days as she is still wheezing.  - Continue oxygen support via [place] to keep O2 saturation above 90% - still not at her baseline so needs continued hospitalization   Active Problems:   Sepsis / HCAP (healthcare-associated pneumonia) / Leukocytosis - Sepsis criteria met on admission with tachypnea, tachycardia, hypoxia, leukocytosis, lactic acidosis, mild procalcitonin elevation - Recently seen in ED and treated for early pneumonia with Levaquin - Started on broad spectrum Abx: cefepime and vanco for possible HCAP considering she is immunocompromised. If she does better tomorrow, will discontinue her vanc and change her Abx to Po.  -  Resp virus panel is negative - Blood cx negative so far - Strep pneumonia is negative - Sputum cx pending  - if no fevers overnight and WBC better, consider changing to PO ABX in AM    Atrial fibrillation (HCC) / Chronic anticoagulation-Xarelto - CHADS vasc score 4 - On AC with xarelto - Rate controlled with Cardizem and metoprolol, HR in 80's this AM    Pancreatic cancer ([place]) - Oncology follow up outpt    Anemia of chronic disease - Due to history of malignancy and sequela of chemotherapy - drop in Hg noted overnight from 11 --> 9.2 this AM, but no clear evidence of bleeding  - Continue to monitor daily CBC    Essential hypertension - Continue Cardizem 360 mg daily and metoprolol 50 mg po BID  DVT prophylaxis: On Xarelto  Code Status: full code  Family Communication: no family at bedside   Disposition Plan: home once resp status stable, possibly in 1-2 days   Consultants:   PT  Procedures:   None   Antimicrobials:   Vanco and cefepime 03/28/2016 -->  Subjective: States she feels sob with movement and walking around. She is having audible wheezing at this time.   Objective: Vitals:   03/30/16 1508 03/30/16 2026 03/31/16 0427 03/31/16 0923  BP:  114/65 128/81   Pulse:  85 82   Resp:  18 18   Temp:  98.7 F (37.1 C) 97.9 F (36.6 C)   TempSrc:  Oral Oral   SpO2: 93% 97% 92% 98%  Weight:   59.3 kg (130 lb 11.2 oz)   Height:        Intake/Output  Summary (Last 24 hours) at 03/31/16 1254 Last data filed at 03/31/16 3838  Gross per 24 hour  Intake             1715 ml  Output             1425 ml  Net              290 ml   Filed Weights   03/29/16 0500 03/30/16 0642 03/31/16 0427  Weight: 57 kg (125 lb 9.6 oz) 58.2 kg (128 lb 4.9 oz) 59.3 kg (130 lb 11.2 oz)    Examination:  General exam: Appears calm and comfortable  Respiratory system: Rhonchorous throughout, on [place], mild exp wheezing  Cardiovascular system: S1 & S2 heard, Rate controlled    Gastrointestinal system: Abdomen is nondistended, soft and nontender. No organomegaly or masses felt. Normal bowel sounds heard. Central nervous system: Alert and oriented. No focal neurological deficits. Extremities: Symmetric 5 x 5 power. Ecchymoses upper bilateral extremities   Data Reviewed: I have personally reviewed following labs and imaging studies  CBC:  Recent Labs Lab 03/24/16 1750 03/26/16 0932 03/28/16 1701 03/30/16 0459 03/31/16 0547  WBC 12.4* 14.7* 13.0* 10.7* 10.8*  NEUTROABS 8.9* 10.8* 12.0*  --   --   HGB 11.7* 12.3 11.0* 9.2* 8.8*  HCT 36.5 39.5 34.5* 29.4* 28.7*  MCV 95.3 95.2 96.1 96.1 96.3  PLT 372 406* 246 182 184   Basic Metabolic Panel:  Recent Labs Lab 03/24/16 1750 03/26/16 0932 03/28/16 1701 03/30/16 0459 03/31/16 0547  NA 138 135 131* 134* 132*  K 3.0* 2.4* 4.4 4.9 4.7  CL 101 93* 97* 104 103  CO2 _0 GLUCOSE 83 174* 102* 115* 120*  BUN _1 CREATININE 0.96 1.18* 1.01* 1.07* 0.83  CALCIUM 8.6* 7.9* 8.4* 8.3* 8.3*   Liver Function Tests:  Recent Labs Lab 03/26/16 0932  AST 26  ALT 22  ALKPHOS 74  BILITOT 0.6  PROT 5.9*  ALBUMIN 3.1*   Cardiac Enzymes:  Recent Labs Lab 03/28/16 1701  TROPONINI <0.03   Urine analysis:    Component Value Date/Time   COLORURINE AMBER (A) 03/28/2016 1841   APPEARANCEUR HAZY (A) 03/28/2016 1841   LABSPEC 1.019 03/28/2016 1841   PHURINE 7.0 03/28/2016 1841   GLUCOSEU NEGATIVE 03/28/2016 1841   HGBUR SMALL (A) 03/28/2016 1841   BILIRUBINUR NEGATIVE 03/28/2016 1841   KETONESUR NEGATIVE 03/28/2016 1841   PROTEINUR 30 (A) 03/28/2016 1841   NITRITE NEGATIVE 03/28/2016 1841   LEUKOCYTESUR LARGE (A) 03/28/2016 1841   Recent Results (from the past 240 hour(s))  Urine culture     Status: Abnormal   Collection Time: 03/28/16  6:42 PM  Result Value Ref Range Status   Specimen Description URINE, CLEAN CATCH  Final   Special Requests NONE  Final   Culture (A)  Final     <10,000 COLONIES/mL INSIGNIFICANT GROWTH Performed at [facility], [address], [place], [place] [address]    Report Status 03/30/2016 FINAL  Final  Respiratory Panel by PCR     Status: None   Collection Time: 03/28/16  7:38 PM  Result Value Ref Range Status   Adenovirus NOT DETECTED NOT DETECTED Final   Coronavirus 229E NOT DETECTED NOT DETECTED Final   Coronavirus HKU1 NOT DETECTED NOT DETECTED Final   Coronavirus NL63 NOT DETECTED NOT DETECTED Final   Coronavirus OC43 NOT DETECTED NOT DETECTED Final   Metapneumovirus NOT DETECTED  NOT DETECTED Final   Rhinovirus / Enterovirus NOT DETECTED NOT DETECTED Final   Influenza A NOT DETECTED NOT DETECTED Final   Influenza B NOT DETECTED NOT DETECTED Final   Parainfluenza Virus 1 NOT DETECTED NOT DETECTED Final   Parainfluenza Virus 2 NOT DETECTED NOT DETECTED Final   Parainfluenza Virus 3 NOT DETECTED NOT DETECTED Final   Parainfluenza Virus 4 NOT DETECTED NOT DETECTED Final   Respiratory Syncytial Virus NOT DETECTED NOT DETECTED Final   Bordetella pertussis NOT DETECTED NOT DETECTED Final   Chlamydophila pneumoniae NOT DETECTED NOT DETECTED Final   Mycoplasma pneumoniae NOT DETECTED NOT DETECTED Final    Comment: Performed at [facility], [place] [address], [place], [place] [address]  Culture, blood (routine x 2) Call MD if unable to obtain prior to antibiotics being given     Status: None (Preliminary result)   Collection Time: 03/28/16  8:17 PM  Result Value Ref Range Status   Specimen Description BLOOD  Final   Special Requests NONE  Final   Culture NO GROWTH 3 DAYS  Final   Report Status PENDING  Incomplete  Culture, blood (routine x 2) Call MD if unable to obtain prior to antibiotics being given     Status: None (Preliminary result)   Collection Time: 03/28/16  8:25 PM  Result Value Ref Range Status   Specimen Description BLOOD  Final   Special Requests NONE  Final   Culture NO GROWTH 3 DAYS  Final   Report Status  PENDING  Incomplete     Radiology Studies: Dg Chest 2 View Result Date: 03/28/2016 Emphysema. No acute cardiopulmonary process. Aortic atherosclerosis.  Scheduled Meds: . ceFEPime   1 g Intravenous Q12H  . diltiazem  360 mg Oral Daily  . escitalopram  10 mg Oral QPM  . guaiFENesin  1,200 mg Oral BID  . ipratropium-albutero  3 mL Nebulization TID  . metoprolol  50 mg Oral   . mometasone-formo  2 puff InhalationBID BID  . pantoprazole  40 mg Oral Daily  . potassium chloride  40 mEq Oral Daily  . rivaroxaban  20 mg Oral Q supper  . vancomycin  500 mg Intravenous Q12H   Continuous Infusions:    LOS: 1 day   Time spent: 35 minutes  Greater than 50% of the time spent on counseling and coordinating the care.  Dejuan Elman Arsenio Loader, MD [facility] Pager [phone]  If 7PM-7AM, please contact night-coverage [email or web address] Password [facility] 03/31/2016, 12:54 PM

## 2016-03-31 NOTE — Progress Notes (Signed)
Pharmacy Antibiotic Note  Lindsay Salazar is a 66 y.o. female admitted on 03/28/2016 with pneumonia.  Pharmacy has been consulted for Vancomycin and Cefepime dosing.  Vancomycin trough is therapeutic at 15 mcg/ml on vancomycin 500mg  q12h. Patient is afebrile, WBC is trending down and renal function is stable.  Plan: Continue vancomycin 500mg  IV q12hrs Cefepime 1gm IV q12hrs Monitor culture data, renal function and clinical course VT at SS prn  Height: 5\' 4"  (162.6 cm) Weight: 130 lb 11.2 oz (59.3 kg) (Scale A) IBW/kg (Calculated) : 54.7  Temp (24hrs), Avg:98.2 F (36.8 C), Min:97.9 F (36.6 C), Max:98.7 F (37.1 C)   Recent Labs Lab 03/24/16 1750 03/24/16 1812 03/26/16 0932 03/28/16 1701 03/28/16 1840 03/30/16 0459 03/31/16 0547  WBC 12.4*  --  14.7* 13.0*  --  10.7* 10.8*  CREATININE 0.96  --  1.18* 1.01*  --  1.07* 0.83  LATICACIDVEN  --  2.33*  --  2.3* 1.8  --   --   VANCOTROUGH  --   --   --   --   --   --  15    Estimated Creatinine Clearance: 56.8 mL/min (by C-G formula based on SCr of 0.83 mg/dL).    Allergies  Allergen Reactions  . Ibuprofen Nausea And Vomiting  . Other     Seafood   . Penicillins Swelling    Has patient had a PCN reaction causing immediate rash, facial/tongue/throat swelling, SOB or lightheadedness with hypotension: Yes Has patient had a PCN reaction causing severe rash involving mucus membranes or skin necrosis: No Has patient had a PCN reaction that required hospitalization No Has patient had a PCN reaction occurring within the last 10 years: No If all of the above answers are "NO", then may proceed with Cephalosporin use.   . Tiotropium Bromide Monohydrate Itching  . Tramadol Nausea And Vomiting    Antimicrobials this admission: Vancomycin 2/23 >>  Cefepime 2/23 >>   Dose adjustments this admission: 2/26 VT: 15 on vanc 500mg  q12h  Microbiology results: 2/23 [id number] 2/23 UCx: insig growth  2/23 Resp PCR: neg   Andrey Cota. Diona Foley,  PharmD, BCPS Clinical Pharmacist [phone] 03/31/2016 8:21 AM

## 2016-04-01 ENCOUNTER — Ambulatory Visit (HOSPITAL_COMMUNITY): Payer: Medicare HMO | Attending: General Surgery | Admitting: Physical Therapy

## 2016-04-01 ENCOUNTER — Inpatient Hospital Stay (HOSPITAL_COMMUNITY): Payer: Medicare HMO

## 2016-04-01 LAB — BLOOD GAS, ARTERIAL
Acid-base deficit: 1.6 mmol/L (ref 0.0–2.0)
Bicarbonate: 21.8 mmol/L (ref 20.0–28.0)
Drawn by: 441371
O2 Content: 2.5 L/min
O2 Saturation: 93.3 %
PCO2 ART: 31.5 mmHg — AB (ref 32.0–48.0)
PH ART: 7.454 — AB (ref 7.350–7.450)
Patient temperature: 98.2
pO2, Arterial: 66.9 mmHg — ABNORMAL LOW (ref 83.0–108.0)

## 2016-04-01 LAB — GLUCOSE, CAPILLARY: GLUCOSE-CAPILLARY: 116 mg/dL — AB (ref 65–99)

## 2016-04-01 MED ORDER — MUSCLE RUB 10-15 % EX CREA
TOPICAL_CREAM | CUTANEOUS | Status: DC | PRN
  Administered 2016-04-01: 03:00:00 via TOPICAL
  Filled 2016-04-01: qty 85

## 2016-04-01 MED ORDER — ZOLPIDEM TARTRATE 5 MG PO TABS
5.0000 mg | ORAL_TABLET | Freq: Once | ORAL | Status: AC
  Administered 2016-04-01: 5 mg via ORAL
  Filled 2016-04-01: qty 1

## 2016-04-01 NOTE — Progress Notes (Signed)
Called RRN to check pt since she has been lethargic today and family says this is not her normal.  Pt has not had any intake, and when bladder scanned she had 250cc.  After calling RRN, pt wanted to get up to the [facility], and she urinated. Pt says she is cold.  Vitals documented.

## 2016-04-01 NOTE — Progress Notes (Signed)
Paged MD in regards to pt lethargy.

## 2016-04-01 NOTE — Progress Notes (Signed)
Pt. Requesting something for back pain and sleep. On call for [facility] made aware via text page. RN awaiting orders. Lycia Sachdeva, Katherine Roan

## 2016-04-01 NOTE — Significant Event (Signed)
Rapid Response Event Note  Overview:  Called as second set of eyes for patient with increased lethagry - concern from family Time Called: 1728 Arrival Time: 1745 Event Type: Other (Comment) (lethargy)  Initial Focused Assessment:  On arrival patient now up to [facility] - pale - responds to voice - states her knees hurt - she has long expiratory phase with wheezes noted - she is speaking in full sentence but with some SOB - voided dark yellow urine.  Back to bed -stands but weak.  Follows commands - looks tired.  Bil BS present - O2 at 3 liters nasal cannula - oriented to person and place - NAD but some purse lip breathing at times - patient states she does this - says her breathing feels heavy - a little more so than normal - warm and dry - she has some mild muscle jerks when asleep.  RN reports she did have Ambien this AM at 0300 -- patient states she takes Ativan at home but not noted in chart.   CBG 116.  Abd soft.  Rectal temp done - 98.2.     Interventions:  Repositoned in bed - chart reviewed.  Dr. Reesa Chew notified of concerns.  ABG and PCXR ordered.  ABG non-revealing.  Awaiting PCXR.  Patient arouses easily- talking on phone with significant other - speech clear.  Handoff to Janett Billow - to call as needed.    Plan of Care (if not transferred): Continue to monitor for changes and call as needed.  Nighttime RRT RN to follow up.    Event Summary:  Name of Physician Notified: Dr. Reesa Chew at 1755    at    Outcome: Stayed in room and stabalized  Event End Time: 1830  Quin Hoop

## 2016-04-01 NOTE — Progress Notes (Addendum)
Patient ID: Lindsay Salazar, female   DOB: September 29, 1950, 66 y.o.   MRN: [id number]  PROGRESS NOTE   Lindsay Salazar  RNH:[id number] DOB: [birth year] DOA: 03/28/2016  PCP: Vesta Mixer  Brief Narrative:  66 y.o. female with medical history significant for ongoing chemotherapy for pancreatic cancer (last chemotherapy was 03/26/2016), atrial fibrillation on xarelto, COPD on oxygen at nighttime, fibromyalgia, hypertension. Patient seen in the ED 4 days prior to the admission and was diagnosed with possible early pneumonia and was started on by mouth Levaquin. Pt presented to [facility] with one-week history of worsening fatigue, generalized weakness and subjective fevers. Patient also reported intermittent palpitations.  In ED, pt was hemodynamically stable. Oxygen saturation was 90% on [place] oxygen support. Blood work showed WBC count 13, hemoglobin 11, sodium 131, Cr 1.01, lactic acid 2.3 and then 1.8. CXR showed emphysema but no acute cardio pulmonary process.   Assessment & Plan:     Acute on chronic respiratory failure with hypoxia (HCC) /Acute COPD exacerbation  - Likely from acute COPD exacerbation and HCAP - Continue current BD: duoneb nebulizer TID scheduled and every 4 hours PRN shortness of breath or wheezing. added pulmicort and Prednisone 11m po for 5 days as she is still wheezing.  - Continue oxygen support via [place] to keep O2 saturation above 90% - still not at her baseline so needs continued hospitalization  -very slow to improve, CTA chest done recently on 03/24/16 shows she does have severe centrilobular emphysema but no PE.  HCAP (healthcare-associated pneumonia); mild UTI - Sepsis criteria met on admission with tachypnea, tachycardia, hypoxia, leukocytosis, lactic acidosis, mild procalcitonin elevation - Recently seen in ED and treated for early pneumonia with Levaquin - Started on broad spectrum Abx: cefepime and vanco for possible HCAP considering she is immunocompromised. If she does better  tomorrow, willchange her Abx to Po.  - Resp virus panel is negative - Blood cx negative so far - Strep pneumonia and legionella is negative - Sputum cx pending     Atrial fibrillation (HCC) / Chronic anticoagulation-Xarelto - CHADS vasc score 4 - On AC with xarelto - Rate controlled with Cardizem and metoprolol, HR in 80's this AM    [place] follow up outpt    Anemia of chronic disease - Due to history of malignancy and sequela of chemotherapy - Continue to monitor daily CBC    Essential hypertension - Continue Cardizem 360 mg daily and metoprolol 50 mg po BID  DVT prophylaxis: On Xarelto  Code Status: full code  Family Communication: no family at bedside   Disposition Plan: She is very slow to improve, will cont inpatient stay at his time. She will be a high risk for readmission otherwise.   Consultants:   PT  Procedures:   None   Antimicrobials:   cefepime 03/28/2016 -->  Subjective: She is still sob with movement and even walking across the room.   Objective: Vitals:   03/31/16 2128 04/01/16 0244 04/01/16 0544 04/01/16 0734  BP: 104/78  (!) 127/47   Pulse: (!) 101  95   Resp: 20  (!) 22   Temp: 98 F (36.7 C)  98.2 F (36.8 C)   TempSrc: Oral  Oral   SpO2: 98% 95% 97% 96%  Weight:   57.2 kg (126 lb 1.6 oz)   Height:        Intake/Output Summary (Last 24 hours) at 04/01/16 1119 Last data filed at 04/01/16 0900  Gross per  24 hour  Intake             1512 ml  Output              450 ml  Net             1062 ml   Filed Weights   03/30/16 0642 03/31/16 0427 04/01/16 0544  Weight: 58.2 kg (128 lb 4.9 oz) 59.3 kg (130 lb 11.2 oz) 57.2 kg (126 lb 1.6 oz)    Examination:  General exam: Appears calm and comfortable  Respiratory system: diffuse Rhonchorous throughout, on [place], mild to moderate exp wheezing  Cardiovascular system: S1 & S2 heard, Rate controlled  Gastrointestinal system: Abdomen is nondistended, soft and  nontender. No organomegaly or masses felt. Normal bowel sounds heard. Central nervous system: Alert and oriented. No focal neurological deficits. Extremities: Symmetric 5 x 5 power. Ecchymoses upper bilateral extremities   Data Reviewed: I have personally reviewed following labs and imaging studies  CBC:  Recent Labs Lab 03/26/16 0932 03/28/16 1701 03/30/16 0459 03/31/16 0547  WBC 14.7* 13.0* 10.7* 10.8*  NEUTROABS 10.8* 12.0*  --   --   HGB 12.3 11.0* 9.2* 8.8*  HCT 39.5 34.5* 29.4* 28.7*  MCV 95.2 96.1 96.1 96.3  PLT 406* 246 182 119   Basic Metabolic Panel:  Recent Labs Lab 03/26/16 0932 03/28/16 1701 03/30/16 0459 03/31/16 0547  NA 135 131* 134* 132*  K 2.4* 4.4 4.9 4.7  CL 93* 97* 104 103  CO2 '29 25 22 23  ' GLUCOSE 174* 102* 115* 120*  BUN '13 16 15 14  ' CREATININE 1.18* 1.01* 1.07* 0.83  CALCIUM 7.9* 8.4* 8.3* 8.3*   Liver Function Tests:  Recent Labs Lab 03/26/16 0932  AST 26  ALT 22  ALKPHOS 74  BILITOT 0.6  PROT 5.9*  ALBUMIN 3.1*   Cardiac Enzymes:  Recent Labs Lab 03/28/16 1701  TROPONINI <0.03   Urine analysis:    Component Value Date/Time   COLORURINE AMBER (A) 03/28/2016 1841   APPEARANCEUR HAZY (A) 03/28/2016 1841   LABSPEC 1.019 03/28/2016 1841   PHURINE 7.0 03/28/2016 1841   GLUCOSEU NEGATIVE 03/28/2016 1841   HGBUR SMALL (A) 03/28/2016 1841   BILIRUBINUR NEGATIVE 03/28/2016 [place] NEGATIVE 03/28/2016 1841   PROTEINUR 30 (A) 03/28/2016 1841   NITRITE NEGATIVE 03/28/2016 1841   LEUKOCYTESUR LARGE (A) 03/28/2016 1841   Recent Results (from the past 240 hour(s))  Urine culture     Status: Abnormal   Collection Time: 03/28/16  6:42 PM  Result Value Ref Range Status   Specimen Description URINE, CLEAN CATCH  Final   Special Requests NONE  Final   Culture (A)  Final    <10,000 COLONIES/mL INSIGNIFICANT GROWTH Performed at [facility], [address] [place], [place] [address]    Report Status 03/30/2016 FINAL   Final  Respiratory Panel by PCR     Status: None   Collection Time: 03/28/16  7:38 PM  Result Value Ref Range Status   Adenovirus NOT DETECTED NOT DETECTED Final   Coronavirus 229E NOT DETECTED NOT DETECTED Final   Coronavirus HKU1 NOT DETECTED NOT DETECTED Final   Coronavirus NL63 NOT DETECTED NOT DETECTED Final   Coronavirus OC43 NOT DETECTED NOT DETECTED Final   Metapneumovirus NOT DETECTED NOT DETECTED Final   Rhinovirus / Enterovirus NOT DETECTED NOT DETECTED Final   Influenza A NOT DETECTED NOT DETECTED Final   Influenza B NOT DETECTED NOT DETECTED Final  Parainfluenza Virus 1 NOT DETECTED NOT DETECTED Final   Parainfluenza Virus 2 NOT DETECTED NOT DETECTED Final   Parainfluenza Virus 3 NOT DETECTED NOT DETECTED Final   Parainfluenza Virus 4 NOT DETECTED NOT DETECTED Final   Respiratory Syncytial Virus NOT DETECTED NOT DETECTED Final   Bordetella pertussis NOT DETECTED NOT DETECTED Final   Chlamydophila pneumoniae NOT DETECTED NOT DETECTED Final   Mycoplasma pneumoniae NOT DETECTED NOT DETECTED Final    Comment: Performed at [facility], [place] [address], [place], [place] [address]  Culture, blood (routine x 2) Call MD if unable to obtain prior to antibiotics being given     Status: None (Preliminary result)   Collection Time: 03/28/16  8:17 PM  Result Value Ref Range Status   Specimen Description BLOOD  Final   Special Requests NONE  Final   Culture NO GROWTH 4 DAYS  Final   Report Status PENDING  Incomplete  Culture, blood (routine x 2) Call MD if unable to obtain prior to antibiotics being given     Status: None (Preliminary result)   Collection Time: 03/28/16  8:25 PM  Result Value Ref Range Status   Specimen Description BLOOD  Final   Special Requests NONE  Final   Culture NO GROWTH 4 DAYS  Final   Report Status PENDING  Incomplete     Radiology Studies: Dg Chest 2 View Result Date: 03/28/2016 Emphysema. No acute cardiopulmonary process. Aortic  atherosclerosis.  Scheduled Meds: . ceFEPime   1 g Intravenous Q12H  . diltiazem  360 mg Oral Daily  . escitalopram  10 mg Oral QPM  . guaiFENesin  1,200 mg Oral BID  . ipratropium-albutero  3 mL Nebulization TID  . metoprolol  50 mg Oral   . mometasone-formo  2 puff InhalationBID BID  . pantoprazole  40 mg Oral Daily  . potassium chloride  40 mEq Oral Daily  . rivaroxaban  20 mg Oral Q supper  . vancomycin  500 mg Intravenous Q12H   Continuous Infusions:    LOS: 2 days   Time spent: 35 minutes  Greater than 50% of the time spent on counseling and coordinating the care.  Ankit Arsenio Loader, MD [facility]   If 7PM-7AM, please contact night-coverage [email or web address] Password [email or web address] 04/01/2016, 11:19 AM

## 2016-04-01 NOTE — Progress Notes (Addendum)
PT Cancellation Note  Patient Details Name: Lindsay Salazar MRN: [id number] DOB: July 29, 1950   Cancelled Treatment:    Reason Eval/Treat Not Completed: Fatigue/lethargy limiting ability to participate.  Attempted to see patient x3 today - test, sleeping, and too fatigued and declined PT today. Noted patient to be SOB lying in bed.  Will return at later time for PT session.   Despina Pole 04/01/2016, 8:17 PM Carita Pian. Sanjuana Kava, [place] Pager [phone]

## 2016-04-01 NOTE — Progress Notes (Signed)
Received verbal orders for chest xray and ABG.

## 2016-04-02 ENCOUNTER — Ambulatory Visit (HOSPITAL_COMMUNITY): Payer: Medicare HMO

## 2016-04-02 LAB — CBC
HCT: 26 % — ABNORMAL LOW (ref 36.0–46.0)
Hemoglobin: 8.3 g/dL — ABNORMAL LOW (ref 12.0–15.0)
MCH: 29.6 pg (ref 26.0–34.0)
MCHC: 31.9 g/dL (ref 30.0–36.0)
MCV: 92.9 fL (ref 78.0–100.0)
Platelets: 151 10*3/uL (ref 150–400)
RBC: 2.8 MIL/uL — ABNORMAL LOW (ref 3.87–5.11)
RDW: 20.3 % — AB (ref 11.5–15.5)
WBC: 5.7 10*3/uL (ref 4.0–10.5)

## 2016-04-02 LAB — BASIC METABOLIC PANEL
ANION GAP: 5 (ref 5–15)
Anion gap: 8 (ref 5–15)
BUN: 15 mg/dL (ref 6–20)
BUN: 15 mg/dL (ref 6–20)
CHLORIDE: 101 mmol/L (ref 101–111)
CO2: 24 mmol/L (ref 22–32)
CO2: 28 mmol/L (ref 22–32)
CREATININE: 0.77 mg/dL (ref 0.44–1.00)
Calcium: 8.7 mg/dL — ABNORMAL LOW (ref 8.9–10.3)
Calcium: 8.4 mg/dL — ABNORMAL LOW (ref 8.9–10.3)
Chloride: 102 mmol/L (ref 101–111)
Creatinine, Ser: 1.01 mg/dL — ABNORMAL HIGH (ref 0.44–1.00)
GFR calc Af Amer: >60 mL/min (ref >60)
GFR calc non Af Amer: >60 mL/min (ref >60)
GFR, EST NON AFRICAN AMERICAN: 56 mL/min — AB (ref >60)
GLUCOSE: 158 mg/dL — AB (ref 65–99)
Glucose, Bld: 83 mg/dL (ref 65–99)
POTASSIUM: 3.9 mmol/L (ref 3.5–5.1)
Potassium: 6.2 mmol/L — ABNORMAL HIGH (ref 3.5–5.1)
SODIUM: 135 mmol/L (ref 135–145)
Sodium: 133 mmol/L — ABNORMAL LOW (ref 135–145)

## 2016-04-02 MED ORDER — DEXTROSE 50 % IV SOLN
1.0000 | Freq: Once | INTRAVENOUS | Status: AC
  Administered 2016-04-02: 50 mL via INTRAVENOUS
  Filled 2016-04-02: qty 50

## 2016-04-02 MED ORDER — INSULIN ASPART 100 UNIT/ML IV SOLN
10.0000 [IU] | Freq: Once | INTRAVENOUS | Status: AC
  Administered 2016-04-02: 10 [IU] via INTRAVENOUS

## 2016-04-02 MED ORDER — FUROSEMIDE 10 MG/ML IJ SOLN
40.0000 mg | Freq: Once | INTRAMUSCULAR | Status: AC
  Administered 2016-04-02: 40 mg via INTRAVENOUS
  Filled 2016-04-02: qty 4

## 2016-04-02 MED ORDER — SODIUM POLYSTYRENE SULFONATE 15 GM/60ML PO SUSP
15.0000 g | Freq: Once | ORAL | Status: AC
  Administered 2016-04-02: 15 g via ORAL
  Filled 2016-04-02: qty 60

## 2016-04-02 NOTE — Progress Notes (Signed)
Patient ID: Lindsay Salazar, female   DOB: Feb 11, 1950, 66 y.o.   MRN: [id number]  PROGRESS NOTE   Lindsay Salazar  OZD:[id number] DOB: 1950/03/23 DOA: 03/28/2016  PCP: Vesta Mixer  Brief Narrative:  66 y.o. female with medical history significant for ongoing chemotherapy for pancreatic cancer (last chemotherapy was 03/26/2016), atrial fibrillation on xarelto, COPD on oxygen at nighttime, fibromyalgia, hypertension. Patient seen in the ED 4 days prior to the admission and was diagnosed with possible early pneumonia and was started on by mouth Levaquin. Pt presented to [facility] with one-week history of worsening fatigue, generalized weakness and subjective fevers. Patient also reported intermittent palpitations.  In ED, pt was hemodynamically stable. Oxygen saturation was 90% on [place] oxygen support. Blood work showed WBC count 13, hemoglobin 11, sodium 131, Cr 1.01, lactic acid 2.3 and then 1.8. CXR showed emphysema but no acute cardio pulmonary process.   Assessment & Plan:     Acute on chronic respiratory failure with hypoxia (HCC) /Acute COPD exacerbation- slightly improvement noted today again  - Likely from acute COPD exacerbation and HCAP - Continue current BD: duoneb nebulizer TID scheduled and every 4 hours PRN shortness of breath or wheezing. added pulmicort and Prednisone 42m po for 5 days as she is still wheezing.  - Continue oxygen support via [place] to keep O2 saturation above 90% - still not at her baseline so needs continued hospitalization  -very slow to improve, CTA chest done recently on 03/24/16 shows she does have severe centrilobular emphysema but no Pe. -Due to concerns of aspiration she was placed on aspiration precautions and speech and swallow was consulted. It was determined she is a mild aspiration risk but okay to have regular diet and thin liquids for now.  Hyperkalemia -Lasix IV ordered once along with Kayexalate, IV insulin and 1 amp of D50. She is already on DuoNeb's.  -Recheck BMP at 4 PM today. -Discontinue her oral potassium supplements for now.  HCAP (healthcare-associated pneumonia); mild UTI - Sepsis criteria met on admission with tachypnea, tachycardia, hypoxia, leukocytosis, lactic acidosis, mild procalcitonin elevation - Recently seen in ED and treated for early pneumonia with Levaquin - Started on broad spectrum Abx: cefepime and vanco for possible HCAP considering she is immunocompromised. If she does better tomorrow, willchange her Abx to Po.  - Resp virus panel is negative - Blood cx negative so far - Strep pneumonia and legionella is negative - Sputum cx pending     Atrial fibrillation (HCC) / Chronic anticoagulation-Xarelto - CHADS vasc score 4 - On AC with xarelto - Rate controlled with Cardizem and metoprolol, HR in 80's this AM    Pancreatic cancer ([place] - Oncology follow up outpt    Anemia of chronic disease - Due to history of malignancy and sequela of chemotherapy - Continue to monitor daily CBC    Essential hypertension - Continue Cardizem 360 mg daily and metoprolol 50 mg po BID  DVT prophylaxis: On Xarelto  Code Status: full code  Family Communication: no family at bedside today but spoke with her son and significant other yesterday.   Disposition Plan: She is very slow to improve, will cont inpatient stay at his time. She will be a high risk for readmission otherwise.   Consultants:   PT  Procedures:   None   Antimicrobials:   cefepime 03/28/2016 -->  Subjective: Yesterday afternoon patient seemed more lethargic and continued to progress in the evening therefore rapid response was called. At that time ABG  was done which did not show any acute changes and the chest x-ray was done which showed possible concerns of aspiration pneumonia. At the time she was placed on aspiration precaution and speech and swallow was ordered. Her mentation quickly improved and remained stable. This morning she seems much more awake  and does not have any breathing difficulty. She does state her breathing seems to be low better than what it was yesterday morning. She still reports of slight shortness of breath with walking around but again has improved.   Objective: Vitals:   04/01/16 2109 04/02/16 0406 04/02/16 0745 04/02/16 1217  BP: 133/73 132/79 138/82 123/70  Pulse: (!) 107 73 97 92  Resp: '18 20  18  ' Temp: 97.7 F (36.5 C) 97.8 F (36.6 C) 97.3 F (36.3 C) 98 F (36.7 C)  TempSrc: Oral Oral Oral Oral  SpO2: 97% 96% 99% 98%  Weight:  59.3 kg (130 lb 11.2 oz)    Height:        Intake/Output Summary (Last 24 hours) at 04/02/16 1306 Last data filed at 04/02/16 1259  Gross per 24 hour  Intake             1060 ml  Output             2400 ml  Net            -1340 ml   Filed Weights   03/31/16 0427 04/01/16 0544 04/02/16 0406  Weight: 59.3 kg (130 lb 11.2 oz) 57.2 kg (126 lb 1.6 oz) 59.3 kg (130 lb 11.2 oz)    Examination:  General exam: Appears calm and comfortable  Respiratory system: diffuse Rhonchorous throughout, on [place], mild exp wheezing  Cardiovascular system: S1 & S2 heard, Rate controlled  Gastrointestinal system: Abdomen is nondistended, soft and nontender. No organomegaly or masses felt. Normal bowel sounds heard. Central nervous system: Alert and oriented. No focal neurological deficits. Extremities: Symmetric 5 x 5 power. Ecchymoses upper bilateral extremities   Data Reviewed: I have personally reviewed following labs and imaging studies  CBC:  Recent Labs Lab 03/28/16 1701 03/30/16 0459 03/31/16 0547 04/02/16 0346  WBC 13.0* 10.7* 10.8* 5.7  NEUTROABS 12.0*  --   --   --   HGB 11.0* 9.2* 8.8* 8.3*  HCT 34.5* 29.4* 28.7* 26.0*  MCV 96.1 96.1 96.3 92.9  PLT 246 182 165 329   Basic Metabolic Panel:  Recent Labs Lab 03/28/16 1701 03/30/16 0459 03/31/16 0547 04/02/16 0346  NA 131* 134* 132* 133*  K 4.4 4.9 4.7 6.2*  CL 97* 104 103 101  CO2 '25 22 23 24  ' GLUCOSE 102* 115*  120* 83  BUN '16 15 14 15  ' CREATININE 1.01* 1.07* 0.83 0.77  CALCIUM 8.4* 8.3* 8.3* 8.7*   Liver Function Tests: No results for input(s): AST, ALT, ALKPHOS, BILITOT, PROT, ALBUMIN in the last 168 hours. Cardiac Enzymes:  Recent Labs Lab 03/28/16 1701  TROPONINI <0.03   Urine analysis:    Component Value Date/Time   COLORURINE AMBER (A) 03/28/2016 1841   APPEARANCEUR HAZY (A) 03/28/2016 1841   LABSPEC 1.019 03/28/2016 1841   PHURINE 7.0 03/28/2016 1841   GLUCOSEU NEGATIVE 03/28/2016 1841   HGBUR SMALL (A) 03/28/2016 1841   BILIRUBINUR NEGATIVE 03/28/2016 1841   KETONESUR NEGATIVE 03/28/2016 1841   PROTEINUR 30 (A) 03/28/2016 1841   NITRITE NEGATIVE 03/28/2016 1841   LEUKOCYTESUR LARGE (A) 03/28/2016 1841   Recent Results (from the past 240 hour(s))  Urine culture  Status: Abnormal   Collection Time: 03/28/16  6:42 PM  Result Value Ref Range Status   Specimen Description URINE, CLEAN CATCH  Final   Special Requests NONE  Final   Culture (A)  Final    <10,000 COLONIES/mL INSIGNIFICANT GROWTH Performed at [facility], [place] [address] [place], [place] [address]    Report Status 03/30/2016 FINAL  Final  Respiratory Panel by PCR     Status: None   Collection Time: 03/28/16  7:38 PM  Result Value Ref Range Status   Adenovirus NOT DETECTED NOT DETECTED Final   Coronavirus 229E NOT DETECTED NOT DETECTED Final   Coronavirus HKU1 NOT DETECTED NOT DETECTED Final   Coronavirus NL63 NOT DETECTED NOT DETECTED Final   Coronavirus OC43 NOT DETECTED NOT DETECTED Final   Metapneumovirus NOT DETECTED NOT DETECTED Final   Rhinovirus / Enterovirus NOT DETECTED NOT DETECTED Final   Influenza A NOT DETECTED NOT DETECTED Final   Influenza B NOT DETECTED NOT DETECTED Final   Parainfluenza Virus 1 NOT DETECTED NOT DETECTED Final   Parainfluenza Virus 2 NOT DETECTED NOT DETECTED Final   Parainfluenza Virus 3 NOT DETECTED NOT DETECTED Final   Parainfluenza Virus 4 NOT DETECTED NOT  DETECTED Final   Respiratory Syncytial Virus NOT DETECTED NOT DETECTED Final   Bordetella pertussis NOT DETECTED NOT DETECTED Final   Chlamydophila pneumoniae NOT DETECTED NOT DETECTED Final   Mycoplasma pneumoniae NOT DETECTED NOT DETECTED Final    Comment: Performed at [facility], [place] [address], [place],  [address]  Culture, blood (routine x 2) Call MD if unable to obtain prior to antibiotics being given     Status: None (Preliminary result)   Collection Time: 03/28/16  8:17 PM  Result Value Ref Range Status   Specimen Description BLOOD  Final   Special Requests NONE  Final   Culture NO GROWTH 4 DAYS  Final   Report Status PENDING  Incomplete  Culture, blood (routine x 2) Call MD if unable to obtain prior to antibiotics being given     Status: None (Preliminary result)   Collection Time: 03/28/16  8:25 PM  Result Value Ref Range Status   Specimen Description BLOOD  Final   Special Requests NONE  Final   Culture NO GROWTH 4 DAYS  Final   Report Status PENDING  Incomplete     Radiology Studies: Dg Chest 2 View Result Date: 03/28/2016 Emphysema. No acute cardiopulmonary process. Aortic atherosclerosis.  Scheduled Meds: . ceFEPime   1 g Intravenous Q12H  . diltiazem  360 mg Oral Daily  . escitalopram  10 mg Oral QPM  . guaiFENesin  1,200 mg Oral BID  . ipratropium-albutero  3 mL Nebulization TID  . metoprolol  50 mg Oral   . mometasone-formo  2 puff InhalationBID BID  . pantoprazole  40 mg Oral Daily  . potassium chloride  40 mEq Oral Daily  . rivaroxaban  20 mg Oral Q supper  . vancomycin  500 mg Intravenous Q12H   Continuous Infusions:    LOS: 3 days   Time spent: 35 minutes  Greater than 50% of the time spent on counseling and coordinating the care.  Ankit Arsenio Loader, MD [facility]   If 7PM-7AM, please contact night-coverage [email or web address] Password [email or web address] 04/02/2016, 1:06 PM

## 2016-04-02 NOTE — Evaluation (Signed)
Clinical/Bedside Swallow Evaluation Patient Details  Name: Lindsay Salazar MRN: [id number] Date of Birth: 1950/04/12  Today's Date: 04/02/2016 Time: SLP Start Time (ACUTE ONLY): 1133 SLP Stop Time (ACUTE ONLY): 1145 SLP Time Calculation (min) (ACUTE ONLY): 12 min  Past Medical History:  Past Medical History:  Diagnosis Date  . Anginal pain ([place])    thought she was having   heartburn  . Arthritis    bursitis , fibromyalgia  . Arthritis    "right hip" (11/27/2015)  . Asthma   . Atrial fibrillation ([place])   . Chronic bronchitis ([place])   . Chronic right hip pain   . COPD (chronic obstructive pulmonary disease) ([place])    wears home o2 prn  . Daily headache    "last 2-3 months" (11/27/2015)  . Exposure to TB    "mom had it when she was pregnant with me"  . Fibromyalgia   . GERD (gastroesophageal reflux disease)    use to have it but not now  . Heart murmur    25-  55 years old  . Hypertension   . On home oxygen therapy    11/27/2015 "whenever I need it; don't know how many liters"  . Pancreatic cancer ([place])   . Pneumonia    "several times" (11/27/2015)  . Pneumothorax    Past Surgical History:  Past Surgical History:  Procedure Laterality Date  . ANTERIOR CERVICAL DECOMP/DISCECTOMY FUSION     "put 4 screws in"  . BACK SURGERY    . CARDIAC CATHETERIZATION  08/2004   Archie Salazar 06/18/2010  . CARDIOVERSION N/A 07/18/2015   Procedure: CARDIOVERSION;  Surgeon: Lindsay Hector, MD;  Location: [facility];  Service: Cardiovascular;  Laterality: N/A;  . ESOPHAGOGASTRODUODENOSCOPY N/A 08/09/2015   Procedure: ESOPHAGOGASTRODUODENOSCOPY (EGD);  Surgeon: Lindsay Houston, MD;  Location: [facility];  Service: Endoscopy;  Laterality: N/A;  . EUS N/A 11/29/2015   Procedure: UPPER ENDOSCOPIC ULTRASOUND (EUS) RADIAL;  Surgeon: Lindsay Banister, MD;  Location: [facility];  Service: Endoscopy;  Laterality: N/A;  . IR GENERIC HISTORICAL  12/18/2015   IR US GUIDE VASC ACCESS RIGHT 12/18/2015 Lindsay Mariscal, MD [facility]-INTER[facility]  . IR GENERIC HISTORICAL  12/18/2015   IR FLUORO GUIDE PORT INSERTION RIGHT 12/18/2015 Lindsay Mariscal, MD [facility]  . TONSILLECTOMY    . TUBAL LIGATION     HPI:  66 y.o.femalewith medical history significant for ongoing chemotherapy for pancreatic cancer (last chemotherapy was 03/26/2016), atrial fibrillation on xarelto, COPD on oxygen at nighttime, fibromyalgia, hypertension. Patient seen in the ED 4 days prior to the admission and was diagnosed with possible early pneumonia and was started on by mouth Levaquin. Pt presented to [facility] with one-week history of worsening fatigue, generalized weakness and subjective fevers. Patient also reported intermittent palpitations. In ED, pt was hemodynamically stable. Oxygen saturation was 90% on [place] oxygen support. Blood work showed WBC count 13, hemoglobin 11, sodium 131, Cr 1.01, lactic acid 2.3 and then 1.8. CXR showed emphysema but no acute cardio pulmonary process. Chest xray revealed development of left lung opacities, confluent in the lower lobe with possible left pleural effusion. Patchy left suprahilar opacity.   Assessment / Plan / Recommendation Clinical Impression  Pt presents with mild aspiration risk as she consumed regular diet with thin liquids via straw without overt s/s of aspiration. Education provided to pt and nursing and general aspiration precautions. ST to sign off at this time.  SLP Visit Diagnosis: Dysphagia, oropharyngeal phase (R13.12)  Aspiration Risk  Mild aspiration risk    Diet Recommendation Regular;Thin liquid   Liquid Administration via: Straw;Cup Medication Administration: Whole meds with liquid Supervision: Patient able to self feed Postural Changes: Seated upright at 90 degrees    Other  Recommendations Oral Care Recommendations: Oral care BID   Follow up Recommendations None               Swallow Study   General Date of Onset: 03/28/16 HPI: 66 y.o.femalewith medical history  significant for ongoing chemotherapy for pancreatic cancer (last chemotherapy was 03/26/2016), atrial fibrillation on xarelto, COPD on oxygen at nighttime, fibromyalgia, hypertension. Patient seen in the ED 4 days prior to the admission and was diagnosed with possible early pneumonia and was started on by mouth Levaquin. Pt presented to [facility] with one-week history of worsening fatigue, generalized weakness and subjective fevers. Patient also reported intermittent palpitations. In ED, pt was hemodynamically stable. Oxygen saturation was 90% on [place] oxygen support. Blood work showed WBC count 13, hemoglobin 11, sodium 131, Cr 1.01, lactic acid 2.3 and then 1.8. CXR showed emphysema but no acute cardio pulmonary process. Chest xray revealed development of left lung opacities, confluent in the lower lobe with possible left pleural effusion. Patchy left suprahilar opacity. Type of Study: Bedside Swallow Evaluation Previous Swallow Assessment: None in chart Diet Prior to this Study: Regular;Thin liquids Temperature Spikes Noted: No Respiratory Status: Nasal cannula (3 liters) History of Recent Intubation: No Behavior/Cognition: Alert;Cooperative;Pleasant mood Oral Cavity Assessment: Within Functional Limits Oral Care Completed by SLP: No Oral Cavity - Dentition: Poor condition Vision: Functional for self-feeding Self-Feeding Abilities: Able to feed self Patient Positioning: Upright in bed Baseline Vocal Quality: Normal Volitional Cough: Strong Volitional Swallow: Able to elicit    Oral/Motor/Sensory Function Overall Oral Motor/Sensory Function: Within functional limits   Ice Chips Ice chips: Within functional limits Presentation: Spoon;Self Fed   Thin Liquid Thin Liquid: Within functional limits Presentation: Straw;Self Fed    Nectar Thick Nectar Thick Liquid: Not tested   Honey Thick Honey Thick Liquid: Not tested   Puree Puree: Within functional limits Presentation: Self Fed;Spoon   Solid   GO    Solid: Within functional limits Presentation: Self Fed       Lindsay Salazar B. Lindsay Nail M.S., CCC-SLP Speech-Language Pathologist  Hadas Jessop 04/02/2016,11:45 AM

## 2016-04-02 NOTE — Progress Notes (Signed)
Physical Therapy Treatment Patient Details Name: Lindsay Salazar MRN: [id number] DOB: [birth year] Today's Date: 04/02/2016    History of Present Illness 66 y.o.femalewith medical history significant forongoing chemotherapy for pancreatic cancer (last chemotherapy was 03/26/2016), atrialfibrillation on xarelto, COPD, fibromyalgia, hypertension; presented to [facility] with one-week history of worsening fatigue, generalized weakness and subjective fever found to haveAcute on chronic respiratory failure with hypoxia ([place]) /Acute COPD exacerbation and Sepsis / HCAP (healthcare-associated pneumonia) / Leukocytosis    PT Comments    Pt reports feeling better today. She tolerated in room ambulation and seated LE there ex. 2 L O2 in place throughout Rx. Pt declined sitting up in recliner.   Follow Up Recommendations  Home health PT;Supervision/Assistance - 24 hour     Equipment Recommendations  None recommended by PT    Recommendations for Other Services       Precautions / Restrictions Precautions Precautions: Fall;Other (comment) Precaution Comments: watch O2 saturations Restrictions Weight Bearing Restrictions: No    Mobility  Bed Mobility         Supine to sit: Min assist Sit to supine: Supervision   General bed mobility comments: assist to elevate trunk  Transfers   Equipment used: Ambulation equipment used   Sit to Stand: Min guard Stand pivot transfers: Min assist       General transfer comment: assist to stabilize balance during pivot transfer to/from [facility]  Ambulation/Gait Ambulation/Gait assistance: Min guard Ambulation Distance (Feet): 30 Feet Assistive device: Rolling walker (2 wheeled) Gait Pattern/deviations: Step-through pattern;Trunk flexed;Decreased stride length Gait velocity: decreased Gait velocity interpretation: Below normal speed for age/gender General Gait Details: seated rest break after 15 feet. SpO2 92% before ambulation, 88% during and quick  recovery to 96% after   Stairs            Wheelchair Mobility    Modified Rankin (Stroke Patients Only)       Balance     Sitting balance-Leahy Scale: Good       Standing balance-Leahy Scale: Poor Standing balance comment: reliance on UE support at this time, reports BLE weakness                    Cognition Arousal/Alertness: Awake/alert Behavior During Therapy: Flat affect Overall Cognitive Status: Within Functional Limits for tasks assessed                      Exercises General Exercises - Lower Extremity Ankle Circles/Pumps: AROM;Both;20 reps;Seated Long Arc Quad: AROM;Right;Left;10 reps;Seated    General Comments        Pertinent Vitals/Pain Pain Assessment: 0-10 Pain Score: 9  Pain Location: low back and hips Pain Descriptors / Indicators: Aching Pain Intervention(s): Monitored during session    Home Living                      Prior Function            PT Goals (current goals can now be found in the care plan section) Acute Rehab PT Goals Patient Stated Goal: to get better PT Goal Formulation: With patient Time For Goal Achievement: 04/13/16 Potential to Achieve Goals: Fair Progress towards PT goals: Progressing toward goals    Frequency    Min 3X/week      PT Plan Current plan remains appropriate    Co-evaluation             End of Session Equipment Utilized During Treatment: Gait belt;Oxygen Activity Tolerance: Patient  tolerated treatment well Patient left: in bed;with call bell/phone within reach Nurse Communication: Mobility status PT Visit Diagnosis: Unsteadiness on feet (R26.81)     Time: [id number] PT Time Calculation (min) (ACUTE ONLY): 26 min  Charges:  $Gait Training: 8-22 mins $Therapeutic Exercise: 8-22 mins                    G Codes:       Lorriane Shire 04/02/2016, 9:24 AM

## 2016-04-03 ENCOUNTER — Encounter (HOSPITAL_COMMUNITY): Payer: Self-pay | Admitting: Student

## 2016-04-03 DIAGNOSIS — J9621 Acute and chronic respiratory failure with hypoxia: Secondary | ICD-10-CM

## 2016-04-03 DIAGNOSIS — I4891 Unspecified atrial fibrillation: Secondary | ICD-10-CM

## 2016-04-03 DIAGNOSIS — J441 Chronic obstructive pulmonary disease with (acute) exacerbation: Secondary | ICD-10-CM

## 2016-04-03 DIAGNOSIS — Z7901 Long term (current) use of anticoagulants: Secondary | ICD-10-CM

## 2016-04-03 DIAGNOSIS — D638 Anemia in other chronic diseases classified elsewhere: Secondary | ICD-10-CM

## 2016-04-03 DIAGNOSIS — I10 Essential (primary) hypertension: Secondary | ICD-10-CM

## 2016-04-03 DIAGNOSIS — J189 Pneumonia, unspecified organism: Secondary | ICD-10-CM

## 2016-04-03 LAB — CULTURE, BLOOD (ROUTINE X 2)
Culture: NO GROWTH
Culture: NO GROWTH

## 2016-04-03 NOTE — Progress Notes (Signed)
[facility]                                                                              Patient Demographics  Lindsay Salazar, is a 66 y.o. female, DOB - 07-17-1950, [id number]  Admit date - 03/28/2016   Admitting Physician Waldemar Dickens, MD  Outpatient Primary MD for the patient is [name], PA-C  Outpatient specialists:   LOS - 4  days    Chief Complaint  Patient presents with  . Shortness of Breath  . Fatigue       Brief summary   66 y.o.femalewith medical history significant for ongoing chemotherapy for pancreatic cancer (last chemotherapy was 03/26/2016), atrial fibrillation on xarelto, COPD on oxygen at nighttime, fibromyalgia, hypertension. Patient seen in the ED 4 days prior to the admission and was diagnosed with possible early pneumonia and was started on by mouth Levaquin. Pt presented to [facility] with one-week history of worsening fatigue, generalized weakness and subjective fevers. Patient also reported intermittent palpitations. In ED, pt was hemodynamically stable. Oxygen saturation was 90% on [place] oxygen support. Blood work showed WBC count 13, hemoglobin 11, sodium 131, Cr 1.01, lactic acid 2.3 and then 1.8. CXR showed emphysema but no acute cardio pulmonary process.    Assessment & Plan   Acute on chronic respiratory failure with hypoxia (HCC) /Acute COPD exacerbation - Improving, Likely from acute COPD exacerbation and HCAP - Continue current BD: duoneb nebulizer TID scheduled and every 4 hours PRN, pulmicort, prednisone  - Continue oxygen support via [place] to keep O2 saturation above 90% - CTA chest on 03/24/16 shows she does have severe centrilobular emphysema but no Pe. -Due to concerns of aspiration she was placed on aspiration precautions and speech and swallow was consulted. It was determined she is a mild aspiration risk but okay to have regular diet and thin liquids for now.  Hyperkalemia -Improved after Lasix, Kayexalate, IV insulin  and 1 amp of D50. She is already on DuoNeb's.  HCAP (healthcare-associated pneumonia); mild UTI - Sepsis criteria met on admission with tachypnea, tachycardia, hypoxia, leukocytosis, lactic acidosis, mild procalcitonin elevation - Recently seen in ED and treated for early pneumonia with Levaquin - Started on broad spectrum Abx: cefepime and vanco for possible HCAP considering she is immunocompromised. - Resp virus panel is negative, blood cultures negative so far. Urine Strep antigen, legionella ag is negative - Sputum cx pending     Atrial fibrillation (HCC) / Chronic anticoagulation-Xarelto - CHADS vasc score 4 - On AC with xarelto - Rate controlled with Cardizem and metoprolol    Pancreatic cancer ([place]) - Oncology follow up outpt    Anemia of chronic disease - Due to history of malignancy and sequela of chemotherapy    Essential hypertension - Continue Cardizem 360 mg daily and metoprolol 50 mg po BID  Code Status full  DVT Prophylaxis:  xarelto Family Communication: Discussed in detail with the patient, all imaging results, lab results explained to the patient  Disposition Plan: Still wheezing today, possible DC in am  Time Spent in minutes  25 minutes  Procedures:  CT angiogram chest  Consultants:  None  Antimicrobials:  Cefepime  Medications  Scheduled Meds: . budesonide (PULMICORT) nebulizer solution  0.25 mg Nebulization BID  . ceFEPime (MAXIPIME) IV  1 g Intravenous Q12H  . diltiazem  360 mg Oral Daily  . escitalopram  10 mg Oral QPM  . guaiFENesin  1,200 mg Oral BID  . ipratropium-albuterol  3 mL Nebulization TID  . mouth rinse  15 mL Mouth Rinse BID  . metoprolol  50 mg Oral BID  . pantoprazole  40 mg Oral Daily  . predniSONE  40 mg Oral Q breakfast  . rivaroxaban  20 mg Oral Q supper  . sodium chloride flush  10-40 mL Intracatheter Q12H   Continuous Infusions: PRN Meds:.acetaminophen **OR** acetaminophen, calcium carbonate,  HYDROcodone-acetaminophen, ipratropium-albuterol, MUSCLE RUB, sodium chloride flush   Antibiotics   Anti-infectives    Start     Dose/Rate Route Frequency Ordered Stop   03/29/16 1000  ceFEPIme (MAXIPIME) 1 g in dextrose 5 % 50 mL IVPB     1 g 100 mL/hr over 30 Minutes Intravenous Every 12 hours 03/28/16 2004     03/29/16 0600  vancomycin (VANCOCIN) 500 mg in sodium chloride 0.9 % 100 mL IVPB  Status:  Discontinued     500 mg 100 mL/hr over 60 Minutes Intravenous Every 12 hours 03/28/16 2005 04/01/16 1220   03/28/16 2100  vancomycin (VANCOCIN) IVPB 1000 mg/200 mL premix     1,000 mg 200 mL/hr over 60 Minutes Intravenous  Once 03/28/16 2002 03/29/16 0145   03/28/16 2015  ceFEPIme (MAXIPIME) 1 g in dextrose 5 % 50 mL IVPB     1 g 100 mL/hr over 30 Minutes Intravenous  Once 03/28/16 2002 03/29/16 0115        Subjective:   Lindsay Salazar was seen and examined today.  Feels a lot better today, still wheezing, no fevers or chills. Patient denies dizziness, chest pain, abdominal pain, N/V/D/C, new weakness, numbess, tingling. No acute events overnight.    Objective:   Vitals:   04/03/16 0610 04/03/16 0848 04/03/16 0850 04/03/16 0900  BP: 133/64   (!) 125/57  Pulse: 95   95  Resp: 17     Temp: 97.9 F (36.6 C)   97.8 F (36.6 C)  TempSrc: Oral   Oral  SpO2: 99% 99% 100%   Weight: 58.1 kg (128 lb 1.6 oz)     Height:        Intake/Output Summary (Last 24 hours) at 04/03/16 1129 Last data filed at 04/03/16 0610  Gross per 24 hour  Intake              840 ml  Output              900 ml  Net              -60 ml     Wt Readings from Last 3 Encounters:  04/03/16 58.1 kg (128 lb 1.6 oz)  03/28/16 57 kg (125 lb 9.6 oz)  03/26/16 54.6 kg (120 lb 6.4 oz)     Exam  General: Alert and oriented x 3, NAD  HEENT:  .   Neck: Supple, no JVD, no masses  Cardiovascular: S1 S2 auscultated, no rubs, murmurs or gallops. Regular rate and rhythm.  Respiratory: Bilateral expiratory  wheezing  Gastrointestinal: Soft, nontender, nondistended, + bowel sounds  Ext: no cyanosis clubbing or edema  Neuro: AAOx3, Cr N's II- XII. Strength 5/5 upper and lower extremities bilaterally  Skin: No rashes  Psych: Normal affect and demeanor, alert and oriented x3    Data Reviewed:  I have personally reviewed following labs and imaging studies  Micro Results Recent Results (from the past 240 hour(s))  Urine culture     Status: Abnormal   Collection Time: 03/28/16  6:42 PM  Result Value Ref Range Status   Specimen Description URINE, CLEAN CATCH  Final   Special Requests NONE  Final   Culture (A)  Final    <10,000 COLONIES/mL INSIGNIFICANT GROWTH Performed at [facility], [address], [place], [place] [address]    Report Status 03/30/2016 FINAL  Final  Respiratory Panel by PCR     Status: None   Collection Time: 03/28/16  7:38 PM  Result Value Ref Range Status   Adenovirus NOT DETECTED NOT DETECTED Final   Coronavirus 229E NOT DETECTED NOT DETECTED Final   Coronavirus HKU1 NOT DETECTED NOT DETECTED Final   Coronavirus NL63 NOT DETECTED NOT DETECTED Final   Coronavirus OC43 NOT DETECTED NOT DETECTED Final   Metapneumovirus NOT DETECTED NOT DETECTED Final   Rhinovirus / Enterovirus NOT DETECTED NOT DETECTED Final   Influenza A NOT DETECTED NOT DETECTED Final   Influenza B NOT DETECTED NOT DETECTED Final   Parainfluenza Virus 1 NOT DETECTED NOT DETECTED Final   Parainfluenza Virus 2 NOT DETECTED NOT DETECTED Final   Parainfluenza Virus 3 NOT DETECTED NOT DETECTED Final   Parainfluenza Virus 4 NOT DETECTED NOT DETECTED Final   Respiratory Syncytial Virus NOT DETECTED NOT DETECTED Final   Bordetella pertussis NOT DETECTED NOT DETECTED Final   Chlamydophila pneumoniae NOT DETECTED NOT DETECTED Final   Mycoplasma pneumoniae NOT DETECTED NOT DETECTED Final    Comment: Performed at [facility], [place] [address], [place], [place] [address]  Culture, blood  (routine x 2) Call MD if unable to obtain prior to antibiotics being given     Status: None   Collection Time: 03/28/16  8:17 PM  Result Value Ref Range Status   Specimen Description BLOOD  Final   Special Requests NONE  Final   Culture NO GROWTH 6 DAYS  Final   Report Status 04/03/2016 FINAL  Final  Culture, blood (routine x 2) Call MD if unable to obtain prior to antibiotics being given     Status: None   Collection Time: 03/28/16  8:25 PM  Result Value Ref Range Status   Specimen Description BLOOD  Final   Special Requests NONE  Final   Culture NO GROWTH 6 DAYS  Final   Report Status 04/03/2016 FINAL  Final    Radiology Reports Dg Chest 2 View  Result Date: 03/28/2016 CLINICAL DATA:  66 y/o  F; weakness, shortness of breath. EXAM: CHEST  2 VIEW COMPARISON:  02/29/2016 chest radiograph FINDINGS: Normal stable cardiac silhouette. Minor stable biapical pleuroparenchymal scarring. No consolidation, effusion, or pneumothorax. Emphysema. Aortic atherosclerosis with calcification. Right port catheter with tip projecting over mid SVC. Anterior cervical fusion hardware noted. No acute osseous abnormality. IMPRESSION: Emphysema. No acute cardiopulmonary process. Aortic atherosclerosis. Electronically Signed   By: Kristine Garbe M.D.   On: 03/28/2016 17:48   Ct Angio Chest Pe W And/or Wo Contrast  Result Date: 03/24/2016 CLINICAL DATA:  Right lower extremity DVT. Shortness breath and chest pain. EXAM: CT ANGIOGRAPHY CHEST WITH CONTRAST TECHNIQUE: Multidetector CT imaging of the chest was performed using the standard protocol during bolus administration of intravenous contrast. Multiplanar CT image reconstructions and MIPs were obtained to evaluate the  vascular anatomy. CONTRAST:  100 mL Isovue 370 COMPARISON:  Right lower extremity DVT study 03/24/2016. Two-view chest x-ray 02/29/2016. CT of the chest 12/24/2015. FINDINGS: Cardiovascular: The heart size is normal. No significant coronary  artery disease present. There is no pericardial effusion. Main pulmonary arteries are of normal size. The study is mildly degraded by patient breathing motion. No focal filling defects are present to suggest pulmonary emboli. Mediastinum/Nodes: Atherosclerotic calcifications are present in the aorta without focal stenosis or aneurysm. The left vertebral artery originates from the aorta. Distal esophageal dilation small hiatal hernia are present. Lungs/Pleura: Severe centrilobular emphysema is present. There is no focal nodule or mass lesion present. Ill-defined ground-glass attenuation is present in the upper lobes bilaterally, new since the prior exam Upper Abdomen: A 7 cm left hepatic cyst is stable. The spleen is within normal limits. Kidneys and adrenal glands are normal. Musculoskeletal: Bone windows demonstrate exaggerated kyphosis of the thoracic spine. No acute or healing fracture is present. There is no focal lytic or blastic lesion. Cervical spine fusion is again noted. Review of the MIP images confirms the above findings. IMPRESSION: 1. No evidence for pulmonary embolus. 2. Severe centrilobular emphysema. 3. Ill-defined ground-glass attenuation in the upper lobes bilaterally likely representing edema or early infection. Atelectasis could have a similar appearance. 4. Stable hepatic cyst. 5. Atherosclerosis without aneurysm or focal stenosis of the great vessel origins. Electronically Signed   By: Marin Roberts M.D.   On: 03/24/2016 20:12   Mr Abdomen W Wo Contrast  Result Date: 03/12/2016 CLINICAL DATA:  Pancreatic cancer. EXAM: MRI ABDOMEN WITHOUT AND WITH CONTRAST TECHNIQUE: Multiplanar multisequence MR imaging of the abdomen was performed both before and after the administration of intravenous contrast. CONTRAST:  42mL MULTIHANCE GADOBENATE DIMEGLUMINE 529 MG/ML IV SOLN COMPARISON:  Multiple exams, including 11/28/2015 FINDINGS: Despite efforts by the technologist and patient, motion artifact  is present on today's exam and could not be eliminated. This reduces exam sensitivity and specificity. Lower chest: Unremarkable Hepatobiliary: Innumerable tiny T2 hyperintensities throughout the hepatic parenchyma, probably biliary hamartoma is, similar to prior. Larger 6.8 by 5.8 cm left hepatic lobe cyst. Pancreas: Hypoenhancing infiltrative approximately 3.4 by 1.7 cm region of the pancreatic body and tail, subjectively this appears slightly smaller than on the prior exam and it also measures smaller. This is associated with atrophy of the distal pancreatic tail and dilatation of the associated dorsal pancreatic duct in the distal tail. The masslike portion of the pancreas is closely associated with the splenic vein although no splenic vein thrombosis is currently identified. The splenic artery is also thought to extend along the margin of the mass. Spleen:  Unremarkable Adrenals/Urinary Tract: Small septation in a Bosniak category 2 cyst of the right kidney lower pole. Adrenal glands normal. Stomach/Bowel: Prominent stool throughout the colon favors constipation. Vascular/Lymphatic: Splenic vein skirts the posterior margin of the pancreatic mass. There is some atherosclerotic luminal irregularity in the abdominal aorta without aneurysm. Other:  No supplemental non-categorized findings. Musculoskeletal: Lumbar spondylosis and degenerative disc disease. IMPRESSION: 1. Reduced size of the pancreatic body/tail mass. On axial images the mass measures 3.4 by 1.7 cm, formerly 4.1 by 2.4 cm. No visible metastatic lesions currently. 2.  Prominent stool throughout the colon favors constipation. 3. 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 There is a larger left hepatic lobe cyst. Electronically Signed   By: Gaylyn Rong M.D.   On: 03/12/2016 12:32   Mr 3d Recon At Scanner  Result Date: 03/12/2016 CLINICAL  DATA:  Pancreatic cancer. EXAM: MRI ABDOMEN WITHOUT AND WITH CONTRAST TECHNIQUE: Multiplanar  multisequence MR imaging of the abdomen was performed both before and after the administration of intravenous contrast. CONTRAST:  110m MULTIHANCE GADOBENATE DIMEGLUMINE 529 MG/ML IV SOLN COMPARISON:  Multiple exams, including 11/28/2015 FINDINGS: Despite efforts by the technologist and patient, motion artifact is present on today's exam and could not be eliminated. This reduces exam sensitivity and specificity. Lower chest: Unremarkable Hepatobiliary: Innumerable tiny T2 hyperintensities throughout the hepatic parenchyma, probably biliary hamartoma is, similar to prior. Larger 6.8 by 5.8 cm left hepatic lobe cyst. Pancreas: Hypoenhancing infiltrative approximately 3.4 by 1.7 cm region of the pancreatic body and tail, subjectively this appears slightly smaller than on the prior exam and it also measures smaller. This is associated with atrophy of the distal pancreatic tail and dilatation of the associated dorsal pancreatic duct in the distal tail. The masslike portion of the pancreas is closely associated with the splenic vein although no splenic vein thrombosis is currently identified. The splenic artery is also thought to extend along the margin of the mass. Spleen:  Unremarkable Adrenals/Urinary Tract: Small septation in a Bosniak category 2 cyst of the right kidney lower pole. Adrenal glands normal. Stomach/Bowel: Prominent stool throughout the colon favors constipation. Vascular/Lymphatic: Splenic vein skirts the posterior margin of the pancreatic mass. There is some atherosclerotic luminal irregularity in the abdominal aorta without aneurysm. Other:  No supplemental non-categorized findings. Musculoskeletal: Lumbar spondylosis and degenerative disc disease. IMPRESSION: 1. Reduced size of the pancreatic body/tail mass. On axial images the mass measures 3.4 by 1.7 cm, formerly 4.1 by 2.4 cm. No visible metastatic lesions currently. 2.  Prominent stool throughout the colon favors constipation. 3. Scattered small  biliary hamartoma is in the liver. There is a larger left hepatic lobe cyst. Electronically Signed   By: WVan ClinesM.D.   On: 03/12/2016 12:32   [place]Venous Img Lower Unilateral Right  Result Date: 03/24/2016 CLINICAL DATA:  Right lower extremity edema and pains for 2-3 weeks with shortness of breath EXAM: Right LOWER EXTREMITY VENOUS DOPPLER ULTRASOUND TECHNIQUE: Gray-scale sonography with graded compression, as well as color Doppler and duplex ultrasound were performed to evaluate the lower extremity deep venous systems from the level of the common femoral vein and including the common femoral, femoral, profunda femoral, popliteal and calf veins including the posterior tibial, peroneal and gastrocnemius veins when visible. The superficial great saphenous vein was also interrogated. Spectral Doppler was utilized to evaluate flow at rest and with distal augmentation maneuvers in the common femoral, femoral and popliteal veins. COMPARISON:  None. FINDINGS: Contralateral Common Femoral Vein: Respiratory phasicity is normal and symmetric with the symptomatic side. No evidence of thrombus. Normal compressibility. Common Femoral Vein: No evidence of thrombus. Normal compressibility, respiratory phasicity and response to augmentation. Saphenofemoral Junction: No evidence of thrombus. Normal compressibility and flow on color Doppler imaging. Profunda Femoral Vein: No evidence of thrombus. Normal compressibility and flow on color Doppler imaging. Femoral Vein: Nonocclusive thrombus within the mid right femoral vein. Vein is noncompressible at the mid segment. Popliteal Vein: No evidence of thrombus. Normal compressibility, respiratory phasicity and response to augmentation. Calf Veins: Occlusive thrombus present within the peroneal and posterior tibial veins. Veins are noncompressible. Anterior tibial vein appears grossly patent. Superficial Great Saphenous Vein: No evidence of thrombus. Normal compressibility  and flow on color Doppler imaging. Venous Reflux:  None. Other Findings:  None. IMPRESSION: Positive for nonocclusive acute DVT involving the mid right femoral vein.  Occlusive below the knee thrombus involving the posterior tibial and peroneal veins. Critical Value/emergent results were called by telephone at the time of interpretation on 03/24/2016 at 6:48 pm to Dr. Brantley Stage , who verbally acknowledged these results. Electronically Signed   By: Donavan Foil M.D.   On: 03/24/2016 18:48   Dg Chest Port 1 View  Result Date: 04/01/2016 CLINICAL DATA:  Shortness of breath. EXAM: PORTABLE CHEST 1 VIEW COMPARISON:  Radiograph 03/28/2016, chest CT 03/24/2016 FINDINGS: Accessed right chest port with tip at the atrial caval junction. New from prior exam confluent left basilar opacity and possible pleural effusion. Additionally there is new patchy opacity in the left suprahilar lung. Advanced emphysematous change again seen. The heart size is unchanged. There is aortic atherosclerosis. No pneumothorax. IMPRESSION: Development of left lung opacities, confluent in the lower lobe with possible left pleural effusion. Patchy left suprahilar opacity. Findings may be pneumonia or aspiration. Emphysema.  Aortic atherosclerosis. Electronically Signed   By: Jeb Levering M.D.   On: 04/01/2016 19:46    Lab Data:  CBC:  Recent Labs Lab 03/28/16 1701 03/30/16 0459 03/31/16 0547 04/02/16 0346  WBC 13.0* 10.7* 10.8* 5.7  NEUTROABS 12.0*  --   --   --   HGB 11.0* 9.2* 8.8* 8.3*  HCT 34.5* 29.4* 28.7* 26.0*  MCV 96.1 96.1 96.3 92.9  PLT 246 182 165 941   Basic Metabolic Panel:  Recent Labs Lab 03/28/16 1701 03/30/16 0459 03/31/16 0547 04/02/16 0346 04/02/16 1645  NA 131* 134* 132* 133* 135  K 4.4 4.9 4.7 6.2* 3.9  CL 97* 104 103 101 102  CO2 '25 22 23 24 28  '$ GLUCOSE 102* 115* 120* 83 158*  BUN '16 15 14 15 15  '$ CREATININE 1.01* 1.07* 0.83 0.77 1.01*  CALCIUM 8.4* 8.3* 8.3* 8.7* 8.4*   GFR: Estimated  Creatinine Clearance: 46.7 mL/min (by C-G formula based on SCr of 1.01 mg/dL (H)). Liver Function Tests: No results for input(s): AST, ALT, ALKPHOS, BILITOT, PROT, ALBUMIN in the last 168 hours. No results for input(s): LIPASE, AMYLASE in the last 168 hours. No results for input(s): AMMONIA in the last 168 hours. Coagulation Profile: No results for input(s): INR, PROTIME in the last 168 hours. Cardiac Enzymes:  Recent Labs Lab 03/28/16 1701  TROPONINI <0.03   BNP (last 3 results) No results for input(s): PROBNP in the last 8760 hours. HbA1C: No results for input(s): HGBA1C in the last 72 hours. CBG:  Recent Labs Lab 04/01/16 1737  GLUCAP 116*   Lipid Profile: No results for input(s): CHOL, HDL, LDLCALC, TRIG, CHOLHDL, LDLDIRECT in the last 72 hours. Thyroid Function Tests: No results for input(s): TSH, T4TOTAL, FREET4, T3FREE, THYROIDAB in the last 72 hours. Anemia Panel: No results for input(s): VITAMINB12, FOLATE, FERRITIN, TIBC, IRON, RETICCTPCT in the last 72 hours. Urine analysis:    Component Value Date/Time   COLORURINE AMBER (A) 03/28/2016 1841   APPEARANCEUR HAZY (A) 03/28/2016 1841   LABSPEC 1.019 03/28/2016 1841   PHURINE 7.0 03/28/2016 1841   GLUCOSEU NEGATIVE 03/28/2016 1841   HGBUR SMALL (A) 03/28/2016 1841   BILIRUBINUR NEGATIVE 03/28/2016 [place] 03/28/2016 1841   PROTEINUR 30 (A) 03/28/2016 1841   NITRITE NEGATIVE 03/28/2016 1841   LEUKOCYTESUR LARGE (A) 03/28/2016 1841     Kida Digiulio M.D. [facility] 04/03/2016, 11:29 AM  Pager: [phone] Between 7am to 7pm - call Pager - [phone]  After 7pm go to [email or web address] - password [email or web address]  Call night coverage  person covering after 7pm

## 2016-04-03 NOTE — Care Management Note (Signed)
Case Management Note  Patient Details  Name: Lindsay Salazar MRN: [id number] Date of Birth: Jun 05, 1950  Subjective/Objective:   Admitted with CHF                 Action/Plan: Patient lives at home with her boyfriend; currently undergoing chemo/ radiation treatments; has private insurance with [facility] Medicare with prescription drug coverage; patient would benefit from [facility] services but refused, stated " I have 6 dogs at home and I don't want them to bite someone." CM talked to patient about putting them in a room until her therapy is completed, pt stated " they done tore up the doors and a lot of other things in the house." DME- she has home oxygen and rolling walker at home. CM will continue to follow for DCP  Expected Discharge Date: 04/03/2016             Expected Discharge Plan:  [place]   Discharge planning Services  CM Consult    Choice offered to:  Patient    [facility] Arranged:  Patient Refused     Status of Service:  In process, will continue to follow  Sherrilyn Rist [phone] 04/03/2016, 10:23 AM

## 2016-04-03 NOTE — Care Management Important Message (Signed)
Important Message  Patient Details  Name: Lindsay Salazar MRN: [id number] Date of Birth: 24-Jun-1950   Medicare Important Message Given:  Yes    Ules Marsala 04/03/2016, 11:16 AM

## 2016-04-04 LAB — BASIC METABOLIC PANEL
ANION GAP: 7 (ref 5–15)
BUN: 18 mg/dL (ref 6–20)
CALCIUM: 8.4 mg/dL — AB (ref 8.9–10.3)
CO2: 28 mmol/L (ref 22–32)
Chloride: 103 mmol/L (ref 101–111)
Creatinine, Ser: 0.9 mg/dL (ref 0.44–1.00)
Glucose, Bld: 113 mg/dL — ABNORMAL HIGH (ref 65–99)
POTASSIUM: 3.8 mmol/L (ref 3.5–5.1)
Sodium: 138 mmol/L (ref 135–145)

## 2016-04-04 LAB — MAGNESIUM: MAGNESIUM: 1.6 mg/dL — AB (ref 1.7–2.4)

## 2016-04-04 MED ORDER — RIVAROXABAN 20 MG PO TABS: 20.0000 mg | ORAL_TABLET | Freq: Every day | ORAL | 3 refills | Status: DC

## 2016-04-04 MED ORDER — CEFUROXIME AXETIL 500 MG PO TABS: 500.0000 mg | ORAL_TABLET | Freq: Two times a day (BID) | ORAL | 0 refills | Status: DC

## 2016-04-04 MED ORDER — PREDNISONE 10 MG PO TABS: ORAL_TABLET | ORAL | 0 refills | Status: DC

## 2016-04-04 MED ORDER — GUAIFENESIN ER 600 MG PO TB12: 1200.0000 mg | ORAL_TABLET | Freq: Two times a day (BID) | ORAL | 0 refills | Status: DC

## 2016-04-04 MED ORDER — HEPARIN SOD (PORK) LOCK FLUSH 100 UNIT/ML IV SOLN
500.0000 [IU] | INTRAVENOUS | Status: AC | PRN
  Administered 2016-04-04: 500 [IU]

## 2016-04-04 NOTE — Progress Notes (Signed)
Pt has orders to be discharged. Discharge instructions given and pt has no additional questions at this time. Medication regimen reviewed and pt educated. Pt verbalized understanding and has no additional questions. Telemetry box removed. Port a cath flushed per protocol. Pt stable and waiting for transportation.   Maurene Capes RN

## 2016-04-04 NOTE — Progress Notes (Addendum)
Physical Therapy Treatment Patient Details Name: Lindsay Salazar MRN: [id number] DOB: 02/02/1951 Today's Date: 04/04/2016    History of Present Illness 66 y.o.femalewith medical history significant forongoing chemotherapy for pancreatic cancer (last chemotherapy was 03/26/2016), atrialfibrillation on xarelto, COPD, fibromyalgia, hypertension; presented to [facility] with one-week history of worsening fatigue, generalized weakness and subjective fever found to haveAcute on chronic respiratory failure with hypoxia ([place]) /Acute COPD exacerbation and Sepsis / HCAP (healthcare-associated pneumonia) / Leukocytosis    PT Comments    Pt making gradual progress with mobility during PT sessions. Pt able to ambulate 50 ft with rw and min guard assist. Pt with mild instability but no gross loss of balance. Discussed need for assistance at home for ambulation, pt verbalizes agreement. SpO2 remaininging 90s during ambulation using 2L. Patient denies any questions or concerns regarding returning home. PT to continue to follow to maximize mobility and safety.     Follow Up Recommendations  Home health PT;Supervision/Assistance - 24 hour     Equipment Recommendations  None recommended by PT    Recommendations for Other Services       Precautions / Restrictions Precautions Precautions: Fall;Other (comment) Precaution Comments: watch O2 saturations Restrictions Weight Bearing Restrictions: No    Mobility  Bed Mobility Overal bed mobility: Needs Assistance Bed Mobility: Supine to Sit;Sit to Supine     Supine to sit: Supervision Sit to supine: Supervision   General bed mobility comments: supervision for safety  Transfers Overall transfer level: Needs assistance Equipment used: Rolling walker (2 wheeled) Transfers: Sit to/from Stand Sit to Stand: Min guard         General transfer comment: mild instability with initial stand but no loss of balance.   Ambulation/Gait Ambulation/Gait assistance:  Min guard Ambulation Distance (Feet): 50 Feet Assistive device: Rolling walker (2 wheeled) Gait Pattern/deviations: Step-through pattern;Trunk flexed;Narrow base of support Gait velocity: decreased   General Gait Details: Pt taking small strides with slow pattern. Unsteady pattern but no gross loss of balance. Discussed recommendation of having boyfriend with her for mobility, pt verbalizes agreement   Stairs Stairs:  (Pt refusing to attempt stairs X2. )          Wheelchair Mobility    Modified Rankin (Stroke Patients Only)       Balance Overall balance assessment: Needs assistance Sitting-balance support: No upper extremity supported Sitting balance-Leahy Scale: Good     Standing balance support: Bilateral upper extremity supported Standing balance-Leahy Scale: Poor Standing balance comment: using rw for support                    Cognition Arousal/Alertness: Awake/alert Behavior During Therapy: Flat affect Overall Cognitive Status: Within Functional Limits for tasks assessed                      Exercises      General Comments General comments (skin integrity, edema, etc.): SpO2 96% prior to ambulation and 94% upon return using 2L O2.  Pt taking extended time between sitting and starting ambulation.       Pertinent Vitals/Pain Pain Assessment: Faces Faces Pain Scale: Hurts little more Pain Location: Rt hip Pain Descriptors / Indicators: Aching Pain Intervention(s): Limited activity within patient's tolerance;Monitored during session;Patient requesting pain meds-RN notified    Home Living                      Prior Function  PT Goals (current goals can now be found in the care plan section) Acute Rehab PT Goals Patient Stated Goal: get home PT Goal Formulation: With patient Time For Goal Achievement: 04/13/16 Potential to Achieve Goals: Fair Progress towards PT goals: Progressing toward goals    Frequency     Min 3X/week      PT Plan Current plan remains appropriate    Co-evaluation             End of Session Equipment Utilized During Treatment: Gait belt;Oxygen Activity Tolerance: Patient tolerated treatment well;Patient limited by fatigue Patient left: in bed;with call bell/phone within reach (Pt refusing to sit up in chair after session) Nurse Communication: Mobility status PT Visit Diagnosis: Unsteadiness on feet (R26.81)     Time: 1030-1059 PT Time Calculation (min) (ACUTE ONLY): 29 min  Charges:  $Gait Training: 8-22 mins $Therapeutic Activity: 8-22 mins                    G Codes:       Cassell Clement, PT, CSCS Pager [phone] Office [phone]  04/04/2016, 11:19 AM

## 2016-04-04 NOTE — Discharge Summary (Signed)
Physician Discharge Summary   Patient ID: Lindsay Salazar MRN: [id number] DOB/AGE: 68/03/1950 68 y.o.  Admit date: 03/28/2016 Discharge date: 04/04/2016  Primary Care Physician:  BAUCOM, JENNY B, PA-C  Discharge Diagnoses:    . Atrial fibrillation (HCC) . Pancreatic cancer (HCC) . HCAP (healthcare-associated pneumonia) . Acute on chronic respiratory failure with hypoxia (HCC) . COPD with acute exacerbation (HCC) . Leukocytosis . Anemia of chronic disease . Essential hypertension . Hypoxemia   Consults:none   Recommendations for Outpatient Follow-up:  1. Please repeat CBC/BMET at next visit   DIET: Heart healthy diet    Allergies:   Allergies  Allergen Reactions  . Ibuprofen Nausea And Vomiting  . Other     Seafood   . Penicillins Swelling    Has patient had a PCN reaction causing immediate rash, facial/tongue/throat swelling, SOB or lightheadedness with hypotension: Yes Has patient had a PCN reaction causing severe rash involving mucus membranes or skin necrosis: No Has patient had a PCN reaction that required hospitalization No Has patient had a PCN reaction occurring within the last 10 years: No If all of the above answers are "NO", then may proceed with Cephalosporin use.   . Tiotropium Bromide Monohydrate Itching  . Tramadol Nausea And Vomiting     DISCHARGE MEDICATIONS: Current Discharge Medication List    START taking these medications   Details  cefUROXime (CEFTIN) 500 MG tablet Take 1 tablet (500 mg total) by mouth 2 (two) times daily with a meal. X 4 days Qty: 8 tablet, Refills: 0    predniSONE (DELTASONE) 10 MG tablet Prednisone dosing: Take  Prednisone 40mg (4 tabs) x 3 days, then taper to 30mg (3 tabs) x 3 days, then 20mg (2 tabs) x 3days, then 10mg (1 tab) x 3days, then OFF. Qty: 30 tablet, Refills: 0      CONTINUE these medications which have CHANGED   Details  guaiFENesin (MUCINEX) 600 MG 12 hr tablet Take 2 tablets (1,200 mg total) by mouth  2 (two) times daily. Qty: 60 tablet, Refills: 0    rivaroxaban (XARELTO) 20 MG TABS tablet Take 1 tablet (20 mg total) by mouth daily with supper. Qty: 30 tablet, Refills: 3      CONTINUE these medications which have NOT CHANGED   Details  COMBIVENT RESPIMAT 20-100 MCG/ACT AERS respimat Inhale 1 puff into the lungs 2 (two) times daily. Qty: 1 Inhaler, Refills: 5    diltiazem (CARDIZEM CD) 360 MG 24 hr capsule Take 1 capsule (360 mg total) by mouth daily. Qty: 60 capsule, Refills: 1    escitalopram (LEXAPRO) 10 MG tablet Take 1 tablet (10 mg total) by mouth every evening. Qty: 30 tablet, Refills: 0    Gemcitabine HCl (GEMZAR IV) Inject into the vein. Day 1, day 8, day 15, every 28 days    HYDROcodone-acetaminophen (NORCO) 10-325 MG tablet Take 1 tablet by mouth every 6 (six) hours as needed.    ipratropium (ATROVENT) 0.02 % nebulizer solution Take 2.5 mLs (0.5 mg total) by nebulization 3 (three) times daily. Qty: 75 mL, Refills: 12    ipratropium-albuterol (DUONEB) 0.5-2.5 (3) MG/3ML SOLN Inhale 3 mLs into the lungs every 6 (six) hours as needed (for shortness of breath).     levalbuterol (XOPENEX) 1.25 MG/0.5ML nebulizer solution Take 1.25 mg by nebulization every 4 (four) hours as needed for wheezing or shortness of breath. Qty: 1 each, Refills: 12    metoprolol tartrate (LOPRESSOR) 50 MG tablet Take 1 tablet (50 mg total) by mouth   2 (two) times daily. Qty: 60 tablet, Refills: 0    ondansetron (ZOFRAN) 8 MG tablet Take 1 tablet by mouth daily as needed for nausea/vomiting.    OXYGEN Inhale 2 L into the lungs daily as needed (for breathing).    PACLitaxel Protein-Bound Part (ABRAXANE IV) Inject into the vein. Day 1, day 8, day 15, every 28 days    pantoprazole (PROTONIX) 40 MG tablet Take 1 tablet (40 mg total) by mouth daily. Qty: 60 tablet, Refills: 3    prochlorperazine (COMPAZINE) 10 MG tablet Take 1 tablet by mouth daily as needed for nausea/vomiting.    SYMBICORT  160-4.5 MCG/ACT inhaler Inhale 1 puff into the lungs 2 (two) times daily. Qty: 1 Inhaler, Refills: 12    potassium chloride SA (K-DUR,KLOR-CON) 20 MEQ tablet Take 2 tablets (40 mEq total) by mouth 2 (two) times daily. Qty: 120 tablet, Refills: 0   Associated Diagnoses: Hypokalemia      STOP taking these medications     levofloxacin (LEVAQUIN) 500 MG tablet      Rivaroxaban 15 & 20 MG TBPK      HYDROcodone-acetaminophen (NORCO/VICODIN) 5-325 MG tablet      levofloxacin (LEVAQUIN) 750 MG tablet      nicotine (NICODERM CQ - DOSED IN MG/24 HOURS) 14 mg/24hr patch          Brief H and P: For complete details please refer to admission H and P, but in brief 66 y.o.femalewith medical history significant for ongoing chemotherapy for pancreatic cancer (last chemotherapy was 03/26/2016), atrial fibrillation on xarelto, COPD on oxygen at nighttime, fibromyalgia, hypertension. Patient seen in the ED 4 days prior to the admission and was diagnosed with possible early pneumonia and was started on by mouth Levaquin. Pt presented to [facility] with one-week history of worsening fatigue, generalized weakness and subjective fevers. Patient also reported intermittent palpitations. In ED, pt was hemodynamically stable. Oxygen saturation was 90% on [place] oxygen support. Blood work showed WBC count 13, hemoglobin 11, sodium 131, Cr 1.01, lactic acid 2.3 and then 1.8. CXR showed emphysema but no acute cardio pulmonary process.    Hospital Course:  Acute on chronic respiratory failure with hypoxia (HCC) /Acute COPD exacerbation - Improving, Likely from acute COPD exacerbation and HCAP - Continue current BD: duoneb nebulizer TID scheduled and every 4 hours PRN - Patient was placed on prednisone taper and Ceftin at the time of discharge. - Continue oxygen support via [place] to keep O2 saturation above 90% - CTA chest on 03/24/16 shows she does have severe centrilobular emphysema but no Pe. -Due to concerns of aspiration  she was placed on aspiration precautions and speech and swallow was consulted. It was determined she is a mild aspiration risk but okay to have regular diet and thin liquids for now.  Hyperkalemia -Improved after Lasix, Kayexalate, IV insulin and 1 amp of D50. She is already on DuoNeb's.  HCAP (healthcare-associated pneumonia); mild UTI - Sepsis criteria met on admission with tachypnea, tachycardia, hypoxia, leukocytosis, lactic acidosis, mild procalcitonin elevation - Recently seen in ED and treated for early pneumonia with Levaquin - Started on broad spectrum Abx: cefepime and vanco for possible HCAP considering she is immunocompromised. - Resp virus panel is negative, blood cultures negative so far. Urine Strep antigen, legionella ag is negative - Discharged on ceftin for 4 more days to complete the full course.  Atrial fibrillation (HCC) / Chronic anticoagulation-Xarelto - CHADS vasc score 4 - On AC with xarelto - Rate controlled with Cardizem   and metoprolol  Pancreatic cancer (HCC) - Oncology follow up outpt  Anemia of chronic disease - Due to history of malignancy and sequela of chemotherapy  Essential hypertension - Continue Cardizem 360 mg daily and metoprolol 50 mg po BID   Day of Discharge BP 136/68 (BP Location: Left Arm)   Pulse 70   Temp 98.2 F (36.8 C) (Oral)   Resp 18   Ht 5' 4" (1.626 m)   Wt 60.3 kg (132 lb 14.4 oz) Comment: scale a  SpO2 97%   BMI 22.81 kg/m   Physical Exam: General: Alert and awake oriented x3 not in any acute distress. HEENT: anicteric sclera, pupils reactive to light and accommodation CVS: S1-S2 clear no murmur rubs or gallops Chest: clear to auscultation bilaterally, no wheezing rales or rhonchi Abdomen: soft nontender, nondistended, normal bowel sounds Extremities: no cyanosis, clubbing or edema noted bilaterally Neuro: Cranial nerves II-XII intact, no focal neurological deficits   The results of significant  diagnostics from this hospitalization (including imaging, microbiology, ancillary and laboratory) are listed below for reference.    LAB RESULTS: Basic Metabolic Panel:  Recent Labs Lab 04/02/16 1645 04/04/16 0408  NA 135 138  K 3.9 3.8  CL 102 103  CO2 28 28  GLUCOSE 158* 113*  BUN 15 18  CREATININE 1.01* 0.90  CALCIUM 8.4* 8.4*  MG  --  1.6*   Liver Function Tests: No results for input(s): AST, ALT, ALKPHOS, BILITOT, PROT, ALBUMIN in the last 168 hours. No results for input(s): LIPASE, AMYLASE in the last 168 hours. No results for input(s): AMMONIA in the last 168 hours. CBC:  Recent Labs Lab 03/28/16 1701  03/31/16 0547 04/02/16 0346  WBC 13.0*  < > 10.8* 5.7  NEUTROABS 12.0*  --   --   --   HGB 11.0*  < > 8.8* 8.3*  HCT 34.5*  < > 28.7* 26.0*  MCV 96.1  < > 96.3 92.9  PLT 246  < > 165 151  < > = values in this interval not displayed. Cardiac Enzymes:  Recent Labs Lab 03/28/16 1701  TROPONINI <0.03   BNP: Invalid input(s): POCBNP CBG:  Recent Labs Lab 04/01/16 1737  GLUCAP 116*    Significant Diagnostic Studies:  Dg Chest 2 View  Result Date: 03/28/2016 CLINICAL DATA:  65 y/o  F; weakness, shortness of breath. EXAM: CHEST  2 VIEW COMPARISON:  02/29/2016 chest radiograph FINDINGS: Normal stable cardiac silhouette. Minor stable biapical pleuroparenchymal scarring. No consolidation, effusion, or pneumothorax. Emphysema. Aortic atherosclerosis with calcification. Right port catheter with tip projecting over mid SVC. Anterior cervical fusion hardware noted. No acute osseous abnormality. IMPRESSION: Emphysema. No acute cardiopulmonary process. Aortic atherosclerosis. Electronically Signed   By: Lance  Furusawa-Stratton M.D.   On: 03/28/2016 17:48    2D ECHO:   Disposition and Follow-up: Discharge Instructions    Diet - low sodium heart healthy    Complete by:  As directed    Discharge instructions    Complete by:  As directed    Please continue  duonebs breathing treatments three times a day scheduled and every 6hours as needed for wheezing or shortness of breath   Increase activity slowly    Complete by:  As directed        DISPOSITION: Home   DISCHARGE FOLLOW-UP Follow-up Information    BAUCOM, JENNY B, PA-C On 04/23/2016.   Specialty:  Physician Assistant Why:  At 11:15am for hospital follow up  Contact information: [address]   [place] [address] [phone]            Time spent on Discharge: 25 minutes  Signed:   Devrin Monforte M.D. [facility] 04/04/2016, 12:35 PM Pager: [phone]

## 2016-04-07 ENCOUNTER — Other Ambulatory Visit (HOSPITAL_COMMUNITY): Payer: Self-pay | Admitting: Oncology

## 2016-04-07 ENCOUNTER — Telehealth (HOSPITAL_COMMUNITY): Payer: Self-pay | Admitting: *Deleted

## 2016-04-07 MED ORDER — HYDROCODONE-ACETAMINOPHEN 10-325 MG PO TABS: 1.0000 | ORAL_TABLET | Freq: Four times a day (QID) | ORAL | 0 refills | Status: DC | PRN

## 2016-04-07 NOTE — Telephone Encounter (Signed)
Patient called stating she needs a refill on her pain medication. Also she needs to reschedule her appt this Wednesday. Reviewed with PA-C who refilled her medication and okayed moving her Wednesday appt by a day either way. Notified patient that prescription was ready for pickup and Amy will call to reschedule appt. She verbalized understanding.

## 2016-04-08 ENCOUNTER — Ambulatory Visit (HOSPITAL_COMMUNITY): Payer: Medicare HMO

## 2016-04-09 ENCOUNTER — Ambulatory Visit (HOSPITAL_COMMUNITY): Payer: Medicare HMO

## 2016-04-15 NOTE — Progress Notes (Signed)
Lindsay Fairy, PA-C [address] [place] [address]  Malignant neoplasm of other parts of pancreas ([place]) - Plan: HYDROcodone-acetaminophen (NORCO) 10-325 MG tablet, HYDROcodone-acetaminophen (NORCO/VICODIN) 5-325 MG per tablet 2 tablet  COPD with acute exacerbation ([place]) - Plan: DISCONTINUED: ipratropium-albuterol (DUONEB) 0.5-2.5 (3) MG/3ML nebulizer solution 3 mL  Leg edema, right - Plan: US Venous Img Lower Unilateral Right  Hypokalemia - Plan: potassium chloride 20 MEQ/15ML (10%) SOLN, DISCONTINUED: potassium chloride 20 MEQ/15ML (10%) solution 80 mEq  CURRENT THERAPY: Gemcitabine/Abraxane day 1, 8, 15 every 28 days beginning on 12/19/2015 in the neoadjuvant setting.  INTERVAL HISTORY: Lindsay Salazar 66 y.o. 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 Ardis Hughs on 11/29/2015 with FNA demonstrating adenocarcinoma. CA 19-9 elevated at the initiation of treatment at 197. Started on Gemcitabine/Abraxane beginning on 12/19/2015 in a day 1, 8, 15 every 28 day fashion.    Pancreatic cancer ([place])   11/29/2015 Procedure    EUS by Dr. Ardis Hughs- 4.2cm mass in the body of pancreas causing main pancreatic duct obstruction (and dilation) and focally involving the portosplenic vein confluence. The mass does not involve the SMA, celiac trunk.      11/30/2015 Pathology Results    FINE NEEDLE ASPIRATION, ENDOSCOPIC, PANCREAS BODY (SPECIMEN 1 OF 1 COLLECTED 11/30/15): MALIGNANT CELLS CONSISTENT WITH ADENOCARCINOMA.      12/18/2015 Procedure    Successful placement of right IJ approach port-a-cath with tip at the superior caval atrial junction by IR      12/19/2015 -  Chemotherapy    The patient had PACLitaxel-protein bound (ABRAXANE) chemo infusion 200 mg, 125 mg/m2 = 200 mg, Intravenous,  Once, 1 of 4 cycles  gemcitabine (GEMZAR) 1,634 mg  in sodium chloride 0.9 % 250 mL chemo infusion, 1,000 mg/m2 = 1,634 mg, Intravenous,  Once, 1 of 4 cycles  for chemotherapy treatment.        12/19/2015 Tumor Marker    Patient's tumor was tested for the following markers: CA 19-9. Results of the tumor marker test revealed 197.      12/24/2015 Imaging    CT angio chest- 1. No evidence of pulmonary embolus. 2. Mild scattered bilateral peribronchovascular opacity within the lungs, more nodular at the right middle lobe, likely reflects an atypical infectious process.      01/20/2016 - 01/23/2016 Hospital Admission    Admit date: 01/20/2016 Admission diagnosis: COPD exacerbation Additional comments:       01/28/2016 - 02/07/2016 Hospital Admission    Admit date: 01/28/2016 Admission diagnosis: Atrial fibrillation with rapid ventricular response Additional comments: CHADS vasc score 3, likely secondary to hypoxia, On metoprolol and Cardizem, and on Xarelto for anticoagulation      02/17/2016 - 02/19/2016 Hospital Admission    Admit date: 02/17/2016 Admission diagnosis: .Bright red blood per rectum. Additional comments: Anoscopy performed.  Low volume hematochezia appears to be secondary to hemorrhoidal bleeding. Anoscopy in emergency room did not reveal evidence of gross bleeding. Nevertheless patient needs colonoscopy at some point as she has never undergone this exam in the past. Patient however is not agreeable to undergo colonoscopy during this admission but will consider at a later date.      03/19/2016 Miscellaneous    Patient seen by Dr. Barry Dienes for consideration of distal pancreatectomy.  She is arranged for patient to undergo clearance from a pulmonology and  cardiology standpoint.  Once cleared, she will plan on taking patient to surgery.  In the interim, we will continue with neoadjuvant chemotherapy.      03/28/2016 - 04/04/2016 Hospital Admission    Admit date: 03/28/2016 Admission diagnosis: Acute on chronic respiratory failure,  COPD exacerbation        She is tolerating chemotherapy well.  Unfortunately, due to hospitalizations, she was last treated February 21 with day 1 of cycle 3.  She denies any recurrent nausea or vomiting.  She reports one episode of consuming needs resulting in one episode of vomiting with immediate resolution.  She denies any nausea preceding this event.  She reports that her dog ate her pain medication.  She just got filled on March 5 with #60 tablets.  She says there is about 10 tablets left.  She notes that her dog is okay.  She is advised that I will not refill her pain medication sooner than monthly.  She is educated on her responsibility of protecting her pain medication.  She has 2 skin tears on her right lower leg.  Right leg is also edematous.  She notes that it is from her dog.  She has bilateral dorsal aspect of hand bruising as well.  She reports one episode of falling without loss of consciousness.  She notes that her legs "gave out."  She is typically unkempt but today, nursing reports that she is more unkempt than usual.  She has bathed prior to admittance to the clinic.  Nurse tech bathed her and documented skin tears and ecchymoses.  Also noted that her right sock was tight.  Adult Protective Services will be called again today.  Review of Systems  Constitutional: Negative.  Negative for chills, fever and weight loss.  HENT: Negative.   Eyes: Negative.   Respiratory: Negative.  Negative for cough.   Cardiovascular: Negative.  Negative for chest pain.  Gastrointestinal: Positive for vomiting. Negative for blood in stool, constipation, diarrhea, melena and nausea.  Genitourinary: Negative.   Musculoskeletal: Negative.   Skin:       Multiple ecchymoses and two skin tears.  Neurological: Negative.  Negative for weakness.  Endo/Heme/Allergies: Negative.   Psychiatric/Behavioral: Negative.     Past Medical History:  Diagnosis Date  . Anginal pain ([place])    thought she  was having   heartburn  . Arthritis    bursitis , fibromyalgia  . Arthritis    "right hip" (11/27/2015)  . Asthma   . Atrial fibrillation ([place])   . Chronic bronchitis ([place])   . Chronic right hip pain   . COPD (chronic obstructive pulmonary disease) ([place])    wears home o2 prn  . Daily headache    "last 2-3 months" (11/27/2015)  . Exposure to TB    "mom had it when she was pregnant with me"  . Fibromyalgia   . GERD (gastroesophageal reflux disease)    use to have it but not now  . Heart murmur    45-  45 years old  . Hypertension   . On home oxygen therapy    11/27/2015 "whenever I need it; don't know how many liters"  . Pancreatic cancer ([place])   . Pneumonia    "several times" (11/27/2015)  . Pneumothorax     Past Surgical History:  Procedure Laterality Date  . ANTERIOR CERVICAL DECOMP/DISCECTOMY FUSION     "put 4 screws in"  . BACK SURGERY    . CARDIAC CATHETERIZATION  08/2004   Archie Endo  06/18/2010  . CARDIOVERSION N/A 07/18/2015   Procedure: CARDIOVERSION;  Surgeon: Josue Hector, MD;  Location: [facility];  Service: Cardiovascular;  Laterality: N/A;  . ESOPHAGOGASTRODUODENOSCOPY N/A 08/09/2015   Procedure: ESOPHAGOGASTRODUODENOSCOPY (EGD);  Surgeon: Rogene Houston, MD;  Location: [facility];  Service: Endoscopy;  Laterality: N/A;  . EUS N/A 11/29/2015   Procedure: UPPER ENDOSCOPIC ULTRASOUND (EUS) RADIAL;  Surgeon: Milus Banister, MD;  Location: [facility] END[facility];  Service: Endoscopy;  Laterality: N/A;  . IR GENERIC HISTORICAL  12/18/2015   IR US GUIDE VASC ACCESS RIGHT 12/18/2015 Sandi Mariscal, MD [facility]  . IR GENERIC HISTORICAL  12/18/2015   IR FLUORO GUIDE PORT INSERTION RIGHT 12/18/2015 Sandi Mariscal, MD [facility]  . TONSILLECTOMY    . TUBAL LIGATION      Family History  Problem Relation Age of Onset  . COPD Mother   . Diabetes Father   . Stroke Father     Social History   Social History  . Marital status: Divorced    Spouse name: N/A  . Number of  children: N/A  . Years of education: N/A   Occupational History  . retired    Social History Main Topics  . Smoking status: Former Smoker    Packs/day: 0.50    Years: 46.00    Types: Cigarettes    Start date: 10/09/1969    Quit date: 11/29/2015  . Smokeless tobacco: Never Used  . Alcohol use No     Comment: quit 30 years ago  . Drug use: No  . Sexual activity: Not Currently    Birth control/ protection: Surgical, Post-menopausal   Other Topics Concern  . None   Social History Narrative  . None     PHYSICAL EXAMINATION  ECOG PERFORMANCE STATUS: 1 - Symptomatic but completely ambulatory  Vitals:   04/16/16 0930  BP: 132/78  Pulse: 99  Resp: (!) 22  Temp: 98 F (36.7 C)    GENERAL:alert, no distress, well nourished, well developed, comfortable, cooperative, smiling and unaccompanied, in chemo-bed, in gown with clothes bagged, recently bathed in clinic. SKIN: skin color, texture, turgor are normal, positive for: ecchymoses on dorsal aspects of hands and right leg skin tears, one on anterior leg measuring 1.5 cm and right posterior right leg skin tear measuring 4 x 2 cm. HEAD: Normocephalic, No masses, lesions, tenderness or abnormalities EYES: normal, EOMI, Conjunctiva are pink and non-injected EARS: External ears normal OROPHARYNX:lips, buccal mucosa, and tongue normal and mucous membranes are moist  NECK: supple, trachea midline LYMPH:  no palpable lymphadenopathy BREAST:not examined LUNGS: decreased breath sounds bilaterally. HEART: regular rate & rhythm, no murmurs, no gallops, S1 normal and S2 normal ABDOMEN:abdomen soft and normal bowel sounds BACK: Back symmetric, no curvature., No CVA tenderness EXTREMITIES:less then 2 second capillary refill, no joint deformities, effusion, or inflammation, no skin discoloration, no cyanosis, positive findings:  edema right leg edema with skin tears as documented in skin exam.  NEURO: alert & oriented x 3 with fluent speech,  no focal motor/sensory deficits, gait normal   LABORATORY DATA: CBC    Component Value Date/Time   WBC 18.6 (H) 04/16/2016 1011   RBC 3.65 (L) 04/16/2016 1011   HGB 11.5 (L) 04/16/2016 1011   HCT 36.0 04/16/2016 1011   PLT 207 04/16/2016 1011   MCV 98.6 04/16/2016 1011   MCH 31.5 04/16/2016 1011   MCHC 31.9 04/16/2016 1011   RDW 20.3 (H) 04/16/2016 1011   LYMPHSABS 1.9 04/16/2016 1011  MONOABS 1.5 (H) 04/16/2016 1011   EOSABS 0.0 04/16/2016 1011   BASOSABS 0.0 04/16/2016 1011      Chemistry      Component Value Date/Time   NA 138 04/16/2016 1011   K 2.9 (L) 04/16/2016 1011   CL 100 (L) 04/16/2016 1011   CO2 30 04/16/2016 1011   BUN 24 (H) 04/16/2016 1011   CREATININE 0.81 04/16/2016 1011      Component Value Date/Time   CALCIUM 7.8 (L) 04/16/2016 1011   ALKPHOS 91 04/16/2016 1011   AST 33 04/16/2016 1011   ALT 45 04/16/2016 1011   BILITOT 0.4 04/16/2016 1011        PENDING LABS:   RADIOGRAPHIC STUDIES:  Dg Chest 2 View  Result Date: 03/28/2016 CLINICAL DATA:  66 y/o  F; weakness, shortness of breath. EXAM: CHEST  2 VIEW COMPARISON:  02/29/2016 chest radiograph FINDINGS: Normal stable cardiac silhouette. Minor stable biapical pleuroparenchymal scarring. No consolidation, effusion, or pneumothorax. Emphysema. Aortic atherosclerosis with calcification. Right port catheter with tip projecting over mid SVC. Anterior cervical fusion hardware noted. No acute osseous abnormality. IMPRESSION: Emphysema. No acute cardiopulmonary process. Aortic atherosclerosis. Electronically Signed   By: Kristine Garbe M.D.   On: 03/28/2016 17:48   Ct Angio Chest Pe W And/or Wo Contrast  Result Date: 03/24/2016 CLINICAL DATA:  Right lower extremity DVT. Shortness breath and chest pain. EXAM: CT ANGIOGRAPHY CHEST WITH CONTRAST TECHNIQUE: Multidetector CT imaging of the chest was performed using the standard protocol during bolus administration of intravenous contrast.  Multiplanar CT image reconstructions and MIPs were obtained to evaluate the vascular anatomy. CONTRAST:  100 mL Isovue 370 COMPARISON:  Right lower extremity DVT study 03/24/2016. Two-view chest x-ray 02/29/2016. CT of the chest 12/24/2015. FINDINGS: Cardiovascular: The heart size is normal. No significant coronary artery disease present. There is no pericardial effusion. Main pulmonary arteries are of normal size. The study is mildly degraded by patient breathing motion. No focal filling defects are present to suggest pulmonary emboli. Mediastinum/Nodes: Atherosclerotic calcifications are present in the aorta without focal stenosis or aneurysm. The left vertebral artery originates from the aorta. Distal esophageal dilation small hiatal hernia are present. Lungs/Pleura: Severe centrilobular emphysema is present. There is no focal nodule or mass lesion present. Ill-defined ground-glass attenuation is present in the upper lobes bilaterally, new since the prior exam Upper Abdomen: A 7 cm left hepatic cyst is stable. The spleen is within normal limits. Kidneys and adrenal glands are normal. Musculoskeletal: Bone windows demonstrate exaggerated kyphosis of the thoracic spine. No acute or healing fracture is present. There is no focal lytic or blastic lesion. Cervical spine fusion is again noted. Review of the MIP images confirms the above findings. IMPRESSION: 1. No evidence for pulmonary embolus. 2. Severe centrilobular emphysema. 3. Ill-defined ground-glass attenuation in the upper lobes bilaterally likely representing edema or early infection. Atelectasis could have a similar appearance. 4. Stable hepatic cyst. 5. Atherosclerosis without aneurysm or focal stenosis of the great vessel origins. Electronically Signed   By: San Morelle M.D.   On: 03/24/2016 20:12   US Venous Img Lower Unilateral Right  Result Date: 04/16/2016 CLINICAL DATA:  Right lower extremity edema. EXAM: RIGHT LOWER EXTREMITY VENOUS  DOPPLER ULTRASOUND TECHNIQUE: Gray-scale sonography with graded compression, as well as color Doppler and duplex ultrasound were performed to evaluate the lower extremity deep venous systems from the level of the common femoral vein and including the common femoral, femoral, profunda femoral, popliteal and calf veins including the  posterior tibial, peroneal and gastrocnemius veins when visible. The superficial great saphenous vein was also interrogated. Spectral Doppler was utilized to evaluate flow at rest and with distal augmentation maneuvers in the common femoral, femoral and popliteal veins. COMPARISON:  None. FINDINGS: Contralateral Common Femoral Vein: Respiratory phasicity is normal and symmetric with the symptomatic side. No evidence of thrombus. Normal compressibility. Common Femoral Vein: No evidence of thrombus. Normal compressibility, respiratory phasicity and response to augmentation. Saphenofemoral Junction: No evidence of thrombus. Normal compressibility and flow on color Doppler imaging. Profunda Femoral Vein: No evidence of thrombus. Normal compressibility and flow on color Doppler imaging. Femoral Vein: No evidence of thrombus. Normal compressibility, respiratory phasicity and response to augmentation. Popliteal Vein: No evidence of thrombus. Normal compressibility, respiratory phasicity and response to augmentation. Calf Veins: Limited evaluation secondary to soft tissue edema. No evidence of thrombus. Superficial Great Saphenous Vein: No evidence of thrombus. Normal compressibility and flow on color Doppler imaging. Venous Reflux:  None. Other Findings:  None. IMPRESSION: No evidence of deep venous thrombosis of the right lower extremity. Electronically Signed   By: Kathreen Devoid   On: 04/16/2016 12:37   US Venous Img Lower Unilateral Right  Result Date: 03/24/2016 CLINICAL DATA:  Right lower extremity edema and pains for 2-3 weeks with shortness of breath EXAM: Right LOWER EXTREMITY VENOUS  DOPPLER ULTRASOUND TECHNIQUE: Gray-scale sonography with graded compression, as well as color Doppler and duplex ultrasound were performed to evaluate the lower extremity deep venous systems from the level of the common femoral vein and including the common femoral, femoral, profunda femoral, popliteal and calf veins including the posterior tibial, peroneal and gastrocnemius veins when visible. The superficial great saphenous vein was also interrogated. Spectral Doppler was utilized to evaluate flow at rest and with distal augmentation maneuvers in the common femoral, femoral and popliteal veins. COMPARISON:  None. FINDINGS: Contralateral Common Femoral Vein: Respiratory phasicity is normal and symmetric with the symptomatic side. No evidence of thrombus. Normal compressibility. Common Femoral Vein: No evidence of thrombus. Normal compressibility, respiratory phasicity and response to augmentation. Saphenofemoral Junction: No evidence of thrombus. Normal compressibility and flow on color Doppler imaging. Profunda Femoral Vein: No evidence of thrombus. Normal compressibility and flow on color Doppler imaging. Femoral Vein: Nonocclusive thrombus within the mid right femoral vein. Vein is noncompressible at the mid segment. Popliteal Vein: No evidence of thrombus. Normal compressibility, respiratory phasicity and response to augmentation. Calf Veins: Occlusive thrombus present within the peroneal and posterior tibial veins. Veins are noncompressible. Anterior tibial vein appears grossly patent. Superficial Great Saphenous Vein: No evidence of thrombus. Normal compressibility and flow on color Doppler imaging. Venous Reflux:  None. Other Findings:  None. IMPRESSION: Positive for nonocclusive acute DVT involving the mid right femoral vein. Occlusive below the knee thrombus involving the posterior tibial and peroneal veins. Critical Value/emergent results were called by telephone at the time of interpretation on 03/24/2016  at 6:48 pm to Dr. Brantley Stage , who verbally acknowledged these results. Electronically Signed   By: Donavan Foil M.D.   On: 03/24/2016 18:48   Dg Chest Port 1 View  Result Date: 04/01/2016 CLINICAL DATA:  Shortness of breath. EXAM: PORTABLE CHEST 1 VIEW COMPARISON:  Radiograph 03/28/2016, chest CT 03/24/2016 FINDINGS: Accessed right chest port with tip at the atrial caval junction. New from prior exam confluent left basilar opacity and possible pleural effusion. Additionally there is new patchy opacity in the left suprahilar lung. Advanced emphysematous change again seen. The heart size is unchanged.  There is aortic atherosclerosis. No pneumothorax. IMPRESSION: Development of left lung opacities, confluent in the lower lobe with possible left pleural effusion. Patchy left suprahilar opacity. Findings may be pneumonia or aspiration. Emphysema.  Aortic atherosclerosis. Electronically Signed   By: Jeb Levering M.D.   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 Ardis Hughs on 11/29/2015 with FNA demonstrating adenocarcinoma.  CA 19-9 elevated at the initiation of treatment at 197.  Started on Gemcitabine/Abraxane beginning on 12/19/2015 in a day 1, 8, 15 every 28 day fashion.  Restaging imaging demonstrated a response to neoadjuvant therapy and biochemically, her CA 19-9 decreased.  She was re-evaluated for distal pancreatectomy by Dr. Barry Dienes on 03/19/2016.  Dr. Barry Dienes has arranged for pulmonology and cardiology clearance and anticipates seeing patient back following this clearance.  In the interim, neoadjuvant chemotherapy will continue.  Oncology history updated.  Pre-treatment labs today: CBC diff, CMET, CA 19-9.  I personally reviewed and went over laboratory results  with the patient.  The results are noted within this dictation.    Hypokalemia noted today.  Patient admits to noncompliance with her home [place].  She reports difficulty with large pills.  We will give 80 mEq of potassium solution today.  If tolerated, I will give her prescription for potassium solution moving forward.  Patient prefers potassium solution after taking it today in the clinic.  Prescription is written for 60 mEq of liquid potassium daily.  Pre-treatment labs on day 8, and 15: CBC diff, CMET.  B12 injection today and monthly.  Chart is reviewed and Dr. Marlowe Aschoff note is reviewed in detail.  Copy of note is above in this dictation.  Will refer patient back to cardiology for cardiac clearance for aforementioned surgical plan.  Previous cardiology visit was complicated by acute on chronic respiratory failure secondary to COPD exacerbation.  It is noted that the patient is to follow back up with cardiology following recent hospitalizations.  She has a few skin tears and lacerations on her right lower leg secondary to her dog.  She has a 0.5 cm lesion on #3 toe on right foot, skin tear measuring 1.5 cm on right anterior lower leg, and 4 x 2 cm skin tear on right calf posteriorly.  I will see if we can get her some free bandages and bacitracin.  She is to keep these areas clean and dry.  She has right lower extremity edema.  Orders placed for a stat ultrasound of right lower extremity to evaluate for DVT.  Ultrasound of right lower extremity is NEGATIVE for DVT.  She reports fall at home with right hand/wrist skin tear that is healing nicely.  Patient reports that she has approximately 10 hydrocodone is left of her pain prescription.  She notes that her dog ate the majority of her pills.  [place] Controlled Substance Reporting System is reviewed in detail and she received #60 of hydrocodone on 04/07/2016.  Unfortunately, she is not able to get a refill until beginning of April 2018.  I  will provide her with a pain medication prescription with the fill date of 05/05/2016.  She received 2 hydrocodone pills today in the clinic.  Orders placed for DuoNeb treatment.  This order signed and held.  Return in 4 weeks for follow-up with ongoing treatment in the interim.   ORDERS PLACED FOR THIS ENCOUNTER: Orders Placed This Encounter  Procedures  . US Venous Img Lower Unilateral Right    MEDICATIONS PRESCRIBED THIS ENCOUNTER: Meds ordered this encounter  Medications  . HYDROcodone-acetaminophen (NORCO) 10-325 MG tablet    Sig: Take 1 tablet by mouth every 6 (six) hours as needed.    Dispense:  60 tablet    Refill:  0    To be filled 05/05/2016.  1 month supply    Order Specific Question:   Supervising Provider    Answer:   Brunetta Genera [[id number]]  . HYDROcodone-acetaminophen (NORCO/VICODIN) 5-325 MG per tablet 2 tablet  . potassium chloride 20 MEQ/15ML (10%) SOLN    Sig: Take 45 mLs (60 mEq total) by mouth daily.    Dispense:  450 mL    Refill:  3    Order Specific Question:   Supervising Provider    Answer:   Brunetta Genera [[id number]]    THERAPY PLAN:  Continue with neoadjuvant chemotherapy until patient ascertains pulmonology and cardiology clearance for distal pancreatectomy.   All questions were answered. The patient knows to call the clinic with any problems, questions or concerns. We can certainly see the patient much sooner if necessary.  Patient and plan discussed with Dr. Twana First and she is in agreement with the aforementioned.   This note is electronically signed by: Doy Mince 04/16/2016 2:35 PM

## 2016-04-15 NOTE — Assessment & Plan Note (Addendum)
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 Ardis Hughs on 11/29/2015 with FNA demonstrating adenocarcinoma.  CA 19-9 elevated at the initiation of treatment at 197.  Started on Gemcitabine/Abraxane beginning on 12/19/2015 in a day 1, 8, 15 every 28 day fashion.  Restaging imaging demonstrated a response to neoadjuvant therapy and biochemically, her CA 19-9 decreased.  She was re-evaluated for distal pancreatectomy by Dr. Barry Dienes on 03/19/2016.  Dr. Barry Dienes has arranged for pulmonology and cardiology clearance and anticipates seeing patient back following this clearance.  In the interim, neoadjuvant chemotherapy will continue.  Oncology history updated.  Pre-treatment labs today: CBC diff, CMET, CA 19-9.  I personally reviewed and went over laboratory results with the patient.  The results are noted within this dictation.    Hypokalemia noted today.  Patient admits to noncompliance with her home [place].  She reports difficulty with large pills.  We will give 80 mEq of potassium solution today.  If tolerated, I will give her prescription for potassium solution moving forward.  Patient prefers potassium solution after taking it today in the clinic.  Prescription is written for 60 mEq of liquid potassium daily.  Pre-treatment labs on day 8, and 15: CBC diff, CMET.  B12 injection today and monthly.  Chart is reviewed and Dr. Marlowe Aschoff note is reviewed in detail.  Copy of note is above in this dictation.  Will refer patient back to cardiology for cardiac clearance for aforementioned surgical plan.  Previous cardiology visit was complicated by acute on chronic respiratory failure secondary to COPD exacerbation.  It is noted that the patient is to follow back up with cardiology following recent hospitalizations.  She has a few skin tears and lacerations on her right lower leg secondary to her dog.  She has a  0.5 cm lesion on #3 toe on right foot, skin tear measuring 1.5 cm on right anterior lower leg, and 4 x 2 cm skin tear on right calf posteriorly.  I will see if we can get her some free bandages and bacitracin.  She is to keep these areas clean and dry.  She has right lower extremity edema.  Orders placed for a stat ultrasound of right lower extremity to evaluate for DVT.  Ultrasound of right lower extremity is NEGATIVE for DVT.  She reports fall at home with right hand/wrist skin tear that is healing nicely.  Patient reports that she has approximately 10 hydrocodone is left of her pain prescription.  She notes that her dog ate the majority of her pills.  [place] Controlled Substance Reporting System is reviewed in detail and she received #60 of hydrocodone on 04/07/2016.  Unfortunately, she is not able to get a refill until beginning of April 2018.  I will provide her with a pain medication prescription with the fill date of 05/05/2016.  She received 2 hydrocodone pills today in the clinic.  Orders placed for DuoNeb treatment.  This order signed and held.  Return in 4 weeks for follow-up with ongoing treatment in the interim.

## 2016-04-16 ENCOUNTER — Encounter (HOSPITAL_COMMUNITY): Payer: Medicare HMO | Attending: Hematology & Oncology | Admitting: Oncology

## 2016-04-16 ENCOUNTER — Encounter (HOSPITAL_BASED_OUTPATIENT_CLINIC_OR_DEPARTMENT_OTHER): Payer: Medicare HMO

## 2016-04-16 ENCOUNTER — Ambulatory Visit (HOSPITAL_COMMUNITY)
Admission: RE | Admit: 2016-04-16 | Discharge: 2016-04-16 | Disposition: A | Discharge status: Home / Self Care | Payer: Medicare HMO | Source: Ambulatory Visit | Attending: Oncology | Admitting: Oncology

## 2016-04-16 ENCOUNTER — Encounter (HOSPITAL_COMMUNITY): Payer: Self-pay | Admitting: Oncology

## 2016-04-16 VITALS — BP 132/78 | HR 99 | Temp 98.0°F | Resp 22 | Ht 64.0 in | Wt 124.6 lb

## 2016-04-16 VITALS — BP 139/63 | HR 80 | Temp 98.2°F | Resp 20

## 2016-04-16 DIAGNOSIS — Z5111 Encounter for antineoplastic chemotherapy: Secondary | ICD-10-CM | POA: Diagnosis not present

## 2016-04-16 DIAGNOSIS — C251 Malignant neoplasm of body of pancreas: Secondary | ICD-10-CM | POA: Diagnosis present

## 2016-04-16 DIAGNOSIS — E876 Hypokalemia: Secondary | ICD-10-CM

## 2016-04-16 DIAGNOSIS — E538 Deficiency of other specified B group vitamins: Secondary | ICD-10-CM

## 2016-04-16 DIAGNOSIS — C257 Malignant neoplasm of other parts of pancreas: Secondary | ICD-10-CM

## 2016-04-16 DIAGNOSIS — R600 Localized edema: Secondary | ICD-10-CM | POA: Insufficient documentation

## 2016-04-16 DIAGNOSIS — J441 Chronic obstructive pulmonary disease with (acute) exacerbation: Secondary | ICD-10-CM

## 2016-04-16 LAB — CBC WITH DIFFERENTIAL/PLATELET
BASOS PCT: 0 %
Basophils Absolute: 0 10*3/uL (ref 0.0–0.1)
EOS ABS: 0 10*3/uL (ref 0.0–0.7)
Eosinophils Relative: 0 %
HCT: 36 % (ref 36.0–46.0)
HEMOGLOBIN: 11.5 g/dL — AB (ref 12.0–15.0)
LYMPHS PCT: 10 %
Lymphs Abs: 1.9 10*3/uL (ref 0.7–4.0)
MCH: 31.5 pg (ref 26.0–34.0)
MCHC: 31.9 g/dL (ref 30.0–36.0)
MCV: 98.6 fL (ref 78.0–100.0)
Monocytes Absolute: 1.5 10*3/uL — ABNORMAL HIGH (ref 0.1–1.0)
Monocytes Relative: 8 %
NEUTROS PCT: 82 %
Neutro Abs: 15.2 10*3/uL — ABNORMAL HIGH (ref 1.7–7.7)
PLATELETS: 207 10*3/uL (ref 150–400)
RBC: 3.65 MIL/uL — AB (ref 3.87–5.11)
RDW: 20.3 % — ABNORMAL HIGH (ref 11.5–15.5)
WBC: 18.6 10*3/uL — AB (ref 4.0–10.5)

## 2016-04-16 LAB — COMPREHENSIVE METABOLIC PANEL
ALBUMIN: 2.9 g/dL — AB (ref 3.5–5.0)
ALK PHOS: 91 U/L (ref 38–126)
ALT: 45 U/L (ref 14–54)
AST: 33 U/L (ref 15–41)
Anion gap: 8 (ref 5–15)
BUN: 24 mg/dL — AB (ref 6–20)
CALCIUM: 7.8 mg/dL — AB (ref 8.9–10.3)
CHLORIDE: 100 mmol/L — AB (ref 101–111)
CO2: 30 mmol/L (ref 22–32)
CREATININE: 0.81 mg/dL (ref 0.44–1.00)
GFR calc non Af Amer: >60 mL/min (ref >60)
GLUCOSE: 108 mg/dL — AB (ref 65–99)
Potassium: 2.9 mmol/L — ABNORMAL LOW (ref 3.5–5.1)
SODIUM: 138 mmol/L (ref 135–145)
Total Bilirubin: 0.4 mg/dL (ref 0.3–1.2)
Total Protein: 5.9 g/dL — ABNORMAL LOW (ref 6.5–8.1)

## 2016-04-16 MED ORDER — HEPARIN SOD (PORK) LOCK FLUSH 100 UNIT/ML IV SOLN
INTRAVENOUS | Status: AC
  Filled 2016-04-16: qty 5

## 2016-04-16 MED ORDER — POTASSIUM CHLORIDE 20 MEQ/15ML (10%) PO SOLN
80.0000 meq | Freq: Once | ORAL | Status: AC
  Administered 2016-04-16: 80 meq via ORAL
  Filled 2016-04-16: qty 60

## 2016-04-16 MED ORDER — PROCHLORPERAZINE MALEATE 10 MG PO TABS
10.0000 mg | ORAL_TABLET | Freq: Once | ORAL | Status: AC
  Administered 2016-04-16: 10 mg via ORAL
  Filled 2016-04-16: qty 1

## 2016-04-16 MED ORDER — HYDROCODONE-ACETAMINOPHEN 5-325 MG PO TABS
2.0000 | ORAL_TABLET | Freq: Once | ORAL | Status: AC
  Administered 2016-04-16: 2 via ORAL
  Filled 2016-04-16: qty 2

## 2016-04-16 MED ORDER — PACLITAXEL PROTEIN-BOUND CHEMO INJECTION 100 MG
125.0000 mg/m2 | Freq: Once | INTRAVENOUS | Status: AC
  Administered 2016-04-16: 200 mg via INTRAVENOUS
  Filled 2016-04-16: qty 40

## 2016-04-16 MED ORDER — HYDROCODONE-ACETAMINOPHEN 10-325 MG PO TABS: 1.0000 | ORAL_TABLET | Freq: Four times a day (QID) | ORAL | 0 refills | Status: DC | PRN

## 2016-04-16 MED ORDER — POTASSIUM CHLORIDE 20 MEQ/15ML (10%) PO SOLN: 60.0000 meq | Freq: Every day | ORAL | 3 refills | Status: DC

## 2016-04-16 MED ORDER — HEPARIN SOD (PORK) LOCK FLUSH 100 UNIT/ML IV SOLN
500.0000 [IU] | Freq: Once | INTRAVENOUS | Status: AC | PRN
  Administered 2016-04-16: 500 [IU]

## 2016-04-16 MED ORDER — SODIUM CHLORIDE 0.9 % IV SOLN
1596.0000 mg | Freq: Once | INTRAVENOUS | Status: AC
  Administered 2016-04-16: 1596 mg via INTRAVENOUS
  Filled 2016-04-16: qty 15.78

## 2016-04-16 MED ORDER — CYANOCOBALAMIN 1000 MCG/ML IJ SOLN
1000.0000 ug | Freq: Once | INTRAMUSCULAR | Status: AC
  Administered 2016-04-16: 1000 ug via INTRAMUSCULAR
  Filled 2016-04-16: qty 1

## 2016-04-16 MED ORDER — SODIUM CHLORIDE 0.9% FLUSH
10.0000 mL | INTRAVENOUS | Status: DC | PRN
  Administered 2016-04-16: 10 mL
  Filled 2016-04-16: qty 10

## 2016-04-16 MED ORDER — IPRATROPIUM-ALBUTEROL 0.5-2.5 (3) MG/3ML IN SOLN
3.0000 mL | Freq: Four times a day (QID) | RESPIRATORY_TRACT | Status: DC
  Administered 2016-04-16: 3 mL via RESPIRATORY_TRACT
  Filled 2016-04-16: qty 3

## 2016-04-16 MED ORDER — SODIUM CHLORIDE 0.9 % IV SOLN
Freq: Once | INTRAVENOUS | Status: AC
  Administered 2016-04-16: 12:00:00 via INTRAVENOUS

## 2016-04-16 NOTE — Progress Notes (Signed)
APS called today regarding the condition Mata was in upon arrival to the [facility].

## 2016-04-16 NOTE — Progress Notes (Signed)
[place] tolerated chemo tx and Vit B12 injection well without complaints or incident. Labs and right leg U/S reviewed prior to administering chemotherapy. Pt given PO liquid Potassium for low Potassium level and tolerated well.VSS upon discharge. Pt discharged via wheelchair in satisfactory condition

## 2016-04-16 NOTE — Progress Notes (Signed)
Pt seen in clinic today for scheduled visit and treatment. Pt states that she has a lot of swelling in her right foot and leg and her sock is soaking wet. Pt states that she noticed the drainage at 5:00 this morning. Pt states that her right leg has hurt since she was last seen here in the clinic.   Pt had multiple skin tears and areas on her right leg and wrist.  Pt had one small, healing skin tear on her right wrist/hand area.  Pt had a .5cm x.5cm sore on her right 3rd toe. Pt had a 1.5cmx1.5cm ulcer on her right shin. Pt also had a skin tear measuring 4cm x 2cm on her right calf.   Kirby Crigler PA-C aware of skin issues and swelling.

## 2016-04-16 NOTE — Patient Instructions (Signed)
[place] at [facility] Discharge Instructions  RECOMMENDATIONS MADE BY THE CONSULTANT AND ANY TEST RESULTS WILL BE SENT TO YOUR REFERRING PHYSICIAN.  You were seen today by Kirby Crigler PA-C. Rx for pain med given to filled on 05/05/16. B12 injection today and monthly. Return in 1 month for follow up with original treatment.   Thank you for choosing [place] at [facility] to provide your oncology and hematology care.  To afford each patient quality time with our provider, please arrive at least 15 minutes before your scheduled appointment time.    If you have a lab appointment with the [facility] please come in thru the  Main Entrance and check in at the main information desk  You need to re-schedule your appointment should you arrive 10 or more minutes late.  We strive to give you quality time with our providers, and arriving late affects you and other patients whose appointments are after yours.  Also, if you no show three or more times for appointments you may be dismissed from the clinic at the providers discretion.     Again, thank you for choosing [facility].  Our hope is that these requests will decrease the amount of time that you wait before being seen by our physicians.       _____________________________________________________________  Should you have questions after your visit to [facility], please contact our office at [phone] between the hours of 8:30 a.m. and 4:30 p.m.  Voicemails left after 4:30 p.m. will not be returned until the following business day.  For prescription refill requests, have your pharmacy contact our office.       Resources For Cancer Patients and their Caregivers ? American Cancer Society: Can assist with transportation, wigs, general needs, runs Look Good Feel Better.        812-802-2076 ? Cancer Care: Provides financial assistance, online support groups,  medication/co-pay assistance.  1-800-813-HOPE (530) 034-2098) ? [place] Assists [place] cancer patients and their families through emotional , educational and financial support.  705-609-1545 ? [place] DSS Where to apply for food stamps, Medicaid and utility assistance. 563 220 1033 ? RCATS: Transportation to medical appointments. 716-178-3732 ? Social Security Administration: May apply for disability if have a Stage IV cancer. 445-371-1120 [phone] ? [employer], Disability and Transit Services: Assists with nutrition, care and transit needs. [place] Support Programs: @10RELATIVEDAYS @ > Cancer Support Group  2nd Tuesday of the month 1pm-2pm, Journey Room  > Creative Journey  3rd Tuesday of the month 1130am-1pm, Journey Room  > Look Good Feel Better  1st Wednesday of the month 10am-12 noon, Journey Room (Call [place] to register [phone])

## 2016-04-16 NOTE — Patient Instructions (Signed)
[facility] Discharge Instructions for Patients Receiving Chemotherapy   Beginning January 23rd 2017 lab work for the [facility] will be done in the  Main lab at [facility] on 1st floor. If you have a lab appointment with the [facility] please come in thru the  Main Entrance and check in at the main information desk   Today you received the following chemotherapy agents Abraxane and Gemzar as well as Vit B12 injection. Follow-up as scheduled. Call clinic for any questions or concerns  To help prevent nausea and vomiting after your treatment, we encourage you to take your nausea medication   If you develop nausea and vomiting, or diarrhea that is not controlled by your medication, call the clinic.  The clinic phone number is [phone]. Office hours are Monday-Friday 8:30am-5:00pm.  BELOW ARE SYMPTOMS THAT SHOULD BE REPORTED IMMEDIATELY:  *FEVER GREATER THAN 101.0 F  *CHILLS WITH OR WITHOUT FEVER  NAUSEA AND VOMITING THAT IS NOT CONTROLLED WITH YOUR NAUSEA MEDICATION  *UNUSUAL SHORTNESS OF BREATH  *UNUSUAL BRUISING OR BLEEDING  TENDERNESS IN MOUTH AND THROAT WITH OR WITHOUT PRESENCE OF ULCERS  *URINARY PROBLEMS  *BOWEL PROBLEMS  UNUSUAL RASH Items with * indicate a potential emergency and should be followed up as soon as possible. If you have an emergency after office hours please contact your primary care physician or go to the nearest emergency department.  Please call the clinic during office hours if you have any questions or concerns.   You may also contact the Patient Navigator at [phone] should you have any questions or need assistance in obtaining follow up care.      Resources For Cancer Patients and their Caregivers ? American Cancer Society: Can assist with transportation, wigs, general needs, runs Look Good Feel Better.        559-375-6254 ? Cancer Care: Provides financial assistance, online support groups,  medication/co-pay assistance.  1-800-813-HOPE 754-010-4270) ? [place] Assists [place] cancer patients and their families through emotional , educational and financial support.  934-498-1873 ? [place] DSS Where to apply for food stamps, Medicaid and utility assistance. 218-460-5722 ? RCATS: Transportation to medical appointments. 9785305733 ? Social Security Administration: May apply for disability if have a Stage IV cancer. 615-107-2599 470-422-5349 ? [employer], Disability and Transit Services: Assists with nutrition, care and transit needs. [phone]

## 2016-04-17 LAB — CANCER ANTIGEN 19-9: CA 19 9: 63 U/mL — AB (ref 0–35)

## 2016-04-24 ENCOUNTER — Encounter (HOSPITAL_BASED_OUTPATIENT_CLINIC_OR_DEPARTMENT_OTHER): Payer: Medicare HMO | Admitting: Oncology

## 2016-04-24 ENCOUNTER — Encounter (HOSPITAL_COMMUNITY): Payer: Medicare HMO | Attending: Hematology

## 2016-04-24 ENCOUNTER — Encounter (HOSPITAL_COMMUNITY): Payer: Self-pay

## 2016-04-24 VITALS — BP 142/75 | HR 62 | Temp 98.0°F | Resp 18

## 2016-04-24 DIAGNOSIS — R0602 Shortness of breath: Secondary | ICD-10-CM | POA: Diagnosis not present

## 2016-04-24 DIAGNOSIS — D6959 Other secondary thrombocytopenia: Secondary | ICD-10-CM

## 2016-04-24 DIAGNOSIS — C257 Malignant neoplasm of other parts of pancreas: Secondary | ICD-10-CM

## 2016-04-24 DIAGNOSIS — L03115 Cellulitis of right lower limb: Secondary | ICD-10-CM

## 2016-04-24 DIAGNOSIS — C251 Malignant neoplasm of body of pancreas: Secondary | ICD-10-CM | POA: Insufficient documentation

## 2016-04-24 DIAGNOSIS — J441 Chronic obstructive pulmonary disease with (acute) exacerbation: Secondary | ICD-10-CM

## 2016-04-24 DIAGNOSIS — R600 Localized edema: Secondary | ICD-10-CM

## 2016-04-24 LAB — CBC WITH DIFFERENTIAL/PLATELET
Basophils Absolute: 0 10*3/uL (ref 0.0–0.1)
Basophils Relative: 0 %
Eosinophils Absolute: 0.1 10*3/uL (ref 0.0–0.7)
Eosinophils Relative: 1 %
HCT: 30.5 % — ABNORMAL LOW (ref 36.0–46.0)
HEMOGLOBIN: 9.7 g/dL — AB (ref 12.0–15.0)
LYMPHS ABS: 1.2 10*3/uL (ref 0.7–4.0)
Lymphocytes Relative: 15 %
MCH: 31.2 pg (ref 26.0–34.0)
MCHC: 31.8 g/dL (ref 30.0–36.0)
MCV: 98.1 fL (ref 78.0–100.0)
MONO ABS: 0.6 10*3/uL (ref 0.1–1.0)
MONOS PCT: 8 %
Neutro Abs: 6 10*3/uL (ref 1.7–7.7)
Neutrophils Relative %: 76 %
Platelets: 82 10*3/uL — ABNORMAL LOW (ref 150–400)
RBC: 3.11 MIL/uL — ABNORMAL LOW (ref 3.87–5.11)
RDW: 18.8 % — AB (ref 11.5–15.5)
WBC: 7.9 10*3/uL (ref 4.0–10.5)

## 2016-04-24 LAB — COMPREHENSIVE METABOLIC PANEL
ALBUMIN: 2.8 g/dL — AB (ref 3.5–5.0)
ALK PHOS: 102 U/L (ref 38–126)
ALT: 59 U/L — ABNORMAL HIGH (ref 14–54)
ANION GAP: 12 (ref 5–15)
AST: 47 U/L — ABNORMAL HIGH (ref 15–41)
BILIRUBIN TOTAL: 0.5 mg/dL (ref 0.3–1.2)
BUN: 24 mg/dL — ABNORMAL HIGH (ref 6–20)
CALCIUM: 8.4 mg/dL — AB (ref 8.9–10.3)
CO2: 22 mmol/L (ref 22–32)
Chloride: 100 mmol/L — ABNORMAL LOW (ref 101–111)
Creatinine, Ser: 1.27 mg/dL — ABNORMAL HIGH (ref 0.44–1.00)
GFR, EST AFRICAN AMERICAN: 49 mL/min — AB (ref >60)
GFR, EST NON AFRICAN AMERICAN: 43 mL/min — AB (ref >60)
Glucose, Bld: 123 mg/dL — ABNORMAL HIGH (ref 65–99)
POTASSIUM: 3.6 mmol/L (ref 3.5–5.1)
Sodium: 134 mmol/L — ABNORMAL LOW (ref 135–145)
TOTAL PROTEIN: 6.5 g/dL (ref 6.5–8.1)

## 2016-04-24 MED ORDER — SODIUM CHLORIDE 0.9 % IV SOLN
INTRAVENOUS | Status: DC
  Administered 2016-04-24: 11:00:00 via INTRAVENOUS

## 2016-04-24 MED ORDER — SODIUM CHLORIDE 0.9% FLUSH
10.0000 mL | INTRAVENOUS | Status: DC | PRN
  Administered 2016-04-24: 10 mL via INTRAVENOUS
  Filled 2016-04-24: qty 10

## 2016-04-24 MED ORDER — HEPARIN SOD (PORK) LOCK FLUSH 100 UNIT/ML IV SOLN
INTRAVENOUS | Status: AC
  Filled 2016-04-24: qty 5

## 2016-04-24 MED ORDER — SODIUM CHLORIDE 0.9 % IV SOLN: INTRAVENOUS | Status: DC

## 2016-04-24 MED ORDER — HYDROCODONE-ACETAMINOPHEN 5-325 MG PO TABS: 1.0000 | ORAL_TABLET | ORAL | Status: DC

## 2016-04-24 MED ORDER — HYDROCODONE-ACETAMINOPHEN 5-325 MG PO TABS
1.0000 | ORAL_TABLET | Freq: Once | ORAL | Status: AC
  Administered 2016-04-24: 1 via ORAL

## 2016-04-24 MED ORDER — IPRATROPIUM-ALBUTEROL 0.5-2.5 (3) MG/3ML IN SOLN
3.0000 mL | Freq: Four times a day (QID) | RESPIRATORY_TRACT | Status: DC
  Administered 2016-04-24: 3 mL via RESPIRATORY_TRACT
  Filled 2016-04-24: qty 3

## 2016-04-24 MED ORDER — HEPARIN SOD (PORK) LOCK FLUSH 100 UNIT/ML IV SOLN
500.0000 [IU] | Freq: Once | INTRAVENOUS | Status: AC
  Administered 2016-04-24: 500 [IU] via INTRAVENOUS

## 2016-04-24 MED ORDER — CLINDAMYCIN HCL 300 MG PO CAPS
300.0000 mg | ORAL_CAPSULE | Freq: Once | ORAL | Status: DC
  Filled 2016-04-24: qty 1

## 2016-04-24 MED ORDER — CLINDAMYCIN HCL 300 MG PO CAPS
300.0000 mg | ORAL_CAPSULE | Freq: Three times a day (TID) | ORAL | 0 refills | Status: DC
Start: 2016-04-24 — End: 2016-04-26

## 2016-04-24 MED ORDER — HYDROCODONE-ACETAMINOPHEN 5-325 MG PO TABS
ORAL_TABLET | ORAL | Status: AC
  Filled 2016-04-24: qty 1

## 2016-04-24 MED ORDER — CLINDAMYCIN HCL 150 MG PO CAPS
300.0000 mg | ORAL_CAPSULE | Freq: Once | ORAL | Status: AC
  Administered 2016-04-24: 300 mg via ORAL
  Filled 2016-04-24: qty 2

## 2016-04-24 NOTE — Addendum Note (Signed)
Addended by: Baird [facility] on: 04/24/2016 06:04 PM   Modules accepted: Level of Service

## 2016-04-24 NOTE — Patient Instructions (Signed)
[place] at [facility] Discharge Instructions  RECOMMENDATIONS MADE BY THE CONSULTANT AND ANY TEST RESULTS WILL BE SENT TO YOUR REFERRING PHYSICIAN.  Received IV fluids only today. Chemo held due to low platelets. Follow -up as scheduled. Call clinic for any questions or concerns  Thank you for choosing [place] at [facility] to provide your oncology and hematology care.  To afford each patient quality time with our provider, please arrive at least 15 minutes before your scheduled appointment time.    If you have a lab appointment with the [facility] [facility] please come in thru the  Main Entrance and check in at the main information desk  You need to re-schedule your appointment should you arrive 10 or more minutes late.  We strive to give you quality time with our providers, and arriving late affects you and other patients whose appointments are after yours.  Also, if you no show three or more times for appointments you may be dismissed from the clinic at the providers discretion.     Again, thank you for choosing [facility].  Our hope is that these requests will decrease the amount of time that you wait before being seen by our physicians.       _____________________________________________________________  Should you have questions after your visit to [facility], please contact our office at [phone] between the hours of 8:30 a.m. and 4:30 p.m.  Voicemails left after 4:30 p.m. will not be returned until the following business day.  For prescription refill requests, have your pharmacy contact our office.       Resources For Cancer Patients and their Caregivers ? American Cancer Society: Can assist with transportation, wigs, general needs, runs Look Good Feel Better.        ([phone] 159-9165 ? Cancer Care: Provides financial assistance, online support groups, medication/co-pay assistance.  1-800-813-HOPE  740-046-5498) ? [place] Assists [place] cancer patients and their families through emotional , educational and financial support.  6690827140 ? [place] DSS Where to apply for food stamps, Medicaid and utility assistance. (334)755-9754 ? RCATS: Transportation to medical appointments. 678-021-5891 ? Social Security Administration: May apply for disability if have a Stage IV cancer. 404-699-7706 [phone] ? [employer], Disability and Transit Services: Assists with nutrition, care and transit needs. [place] Support Programs: @10RELATIVEDAYS @ > Cancer Support Group  2nd Tuesday of the month 1pm-2pm, Journey Room  > Creative Journey  3rd Tuesday of the month 1130am-1pm, Journey Room  > Look Good Feel Better  1st Wednesday of the month 10am-12 noon, Journey Room (Call [place] to register [phone])

## 2016-04-24 NOTE — Progress Notes (Signed)
Lindsay Salazar presented to clinic for chemo tx with right lower leg and foot with increased edema, redness and weeping clear fluid. Kirby Crigler PA-C assessed pt and started her on PO antibiotic while in clinic and given script for home use. Pt also given Hydrocodone 5-325 PO 2 different times which were effective for pain relief. Labs reviewed with Kirby Crigler PA and chemo held per PA due to low platelets.Pt given IV fluids as ordered by PA and tolerated well without complaints or incident.VSS upon discharge. Pt discharged via wheelchair in stable condition accompanied by her boyfriend

## 2016-04-24 NOTE — Progress Notes (Signed)
Patient is seen as a work in today.  She scheduled for chemotherapy today, and nursing reports right lower extremity erythema with swelling.  I just saw the patient 1-2 weeks ago with right lower extremity swelling.  Doppler studies on the right leg were negative for DVT.  Please see previous documentation for multiple skin tears and open wounds.  Today is obvious that she has cellulitis.  Appears to be nonpurulent.  She reports pain.  She denies any fevers or chills.  She also notes shortness of breath which is a chronic issue for her.  I think she would benefit from a DuoNeb treatment.  In addition to her right leg issues, she does have left pedal edema which is new.  Vitals - 1 value per visit 5/52/1747  SYSTOLIC 159  DIASTOLIC 75  Pulse 62  Temperature 98  Respirations 18   Gen: NAD Extremities: Left pedal edema, 2+ pitting.  Right lower leg erythema with multiple skin tears, warm to palpation, tender to palpation.  Assessment: 1. Right leg cellulitis, nonpurulent 2. Thrombocytopenia, chemotherapy-induced.  Plan: 1. HOLD chemotherapy for thrombocytopenia- chemotherapy induced. 2. Duoneb in clinic today 3. Hydrocodone x 2 today in clinic. 4. Clindamycin 300 mg PO in clinic 5. Rx for Clindamycin 300 mg TID x 7 days printed. 6. Return next week for chemotherapy and nursing assessment of cellulitis. 7. Patient is to report to ED for progressive erythema, increased pain, etc.  Patient and plan discussed with Dr. Sullivan Lone and he is in agreement with the aforementioned.   Robynn Pane, PA-C 04/24/2016 5:52 PM

## 2016-04-26 ENCOUNTER — Other Ambulatory Visit: Payer: Self-pay

## 2016-04-26 ENCOUNTER — Inpatient Hospital Stay (HOSPITAL_COMMUNITY)
Admission: EM | Admit: 2016-04-26 | Discharge: 2016-05-01 | DRG: 603 | Disposition: A | Discharge status: Home / Self Care | Payer: Medicare HMO | Attending: Internal Medicine | Admitting: Internal Medicine

## 2016-04-26 ENCOUNTER — Encounter (HOSPITAL_COMMUNITY): Payer: Self-pay | Admitting: *Deleted

## 2016-04-26 ENCOUNTER — Emergency Department (HOSPITAL_COMMUNITY): Payer: Medicare HMO

## 2016-04-26 DIAGNOSIS — L0390 Cellulitis, unspecified: Secondary | ICD-10-CM | POA: Diagnosis present

## 2016-04-26 DIAGNOSIS — J9611 Chronic respiratory failure with hypoxia: Secondary | ICD-10-CM | POA: Diagnosis present

## 2016-04-26 DIAGNOSIS — J439 Emphysema, unspecified: Secondary | ICD-10-CM | POA: Diagnosis present

## 2016-04-26 DIAGNOSIS — I10 Essential (primary) hypertension: Secondary | ICD-10-CM | POA: Diagnosis present

## 2016-04-26 DIAGNOSIS — Z8507 Personal history of malignant neoplasm of pancreas: Secondary | ICD-10-CM

## 2016-04-26 DIAGNOSIS — C257 Malignant neoplasm of other parts of pancreas: Secondary | ICD-10-CM

## 2016-04-26 DIAGNOSIS — M25551 Pain in right hip: Secondary | ICD-10-CM | POA: Diagnosis present

## 2016-04-26 DIAGNOSIS — I482 Chronic atrial fibrillation: Secondary | ICD-10-CM | POA: Diagnosis present

## 2016-04-26 DIAGNOSIS — R000 Tachycardia, unspecified: Secondary | ICD-10-CM | POA: Diagnosis present

## 2016-04-26 DIAGNOSIS — A419 Sepsis, unspecified organism: Secondary | ICD-10-CM

## 2016-04-26 DIAGNOSIS — M79604 Pain in right leg: Secondary | ICD-10-CM

## 2016-04-26 DIAGNOSIS — I4891 Unspecified atrial fibrillation: Secondary | ICD-10-CM | POA: Diagnosis not present

## 2016-04-26 DIAGNOSIS — C254 Malignant neoplasm of endocrine pancreas: Secondary | ICD-10-CM | POA: Diagnosis not present

## 2016-04-26 DIAGNOSIS — C251 Malignant neoplasm of body of pancreas: Secondary | ICD-10-CM | POA: Diagnosis not present

## 2016-04-26 DIAGNOSIS — Z9981 Dependence on supplemental oxygen: Secondary | ICD-10-CM | POA: Diagnosis not present

## 2016-04-26 DIAGNOSIS — C259 Malignant neoplasm of pancreas, unspecified: Secondary | ICD-10-CM | POA: Diagnosis present

## 2016-04-26 DIAGNOSIS — K219 Gastro-esophageal reflux disease without esophagitis: Secondary | ICD-10-CM | POA: Diagnosis present

## 2016-04-26 DIAGNOSIS — M797 Fibromyalgia: Secondary | ICD-10-CM | POA: Diagnosis present

## 2016-04-26 DIAGNOSIS — D638 Anemia in other chronic diseases classified elsewhere: Secondary | ICD-10-CM | POA: Diagnosis present

## 2016-04-26 DIAGNOSIS — Z9221 Personal history of antineoplastic chemotherapy: Secondary | ICD-10-CM | POA: Diagnosis not present

## 2016-04-26 DIAGNOSIS — G8929 Other chronic pain: Secondary | ICD-10-CM | POA: Diagnosis present

## 2016-04-26 DIAGNOSIS — L03115 Cellulitis of right lower limb: Secondary | ICD-10-CM | POA: Diagnosis present

## 2016-04-26 DIAGNOSIS — Z981 Arthrodesis status: Secondary | ICD-10-CM

## 2016-04-26 DIAGNOSIS — Z825 Family history of asthma and other chronic lower respiratory diseases: Secondary | ICD-10-CM | POA: Diagnosis not present

## 2016-04-26 DIAGNOSIS — Z201 Contact with and (suspected) exposure to tuberculosis: Secondary | ICD-10-CM | POA: Diagnosis present

## 2016-04-26 DIAGNOSIS — Z87891 Personal history of nicotine dependence: Secondary | ICD-10-CM | POA: Diagnosis not present

## 2016-04-26 DIAGNOSIS — M79605 Pain in left leg: Secondary | ICD-10-CM

## 2016-04-26 DIAGNOSIS — J449 Chronic obstructive pulmonary disease, unspecified: Secondary | ICD-10-CM | POA: Diagnosis present

## 2016-04-26 LAB — CBC WITH DIFFERENTIAL/PLATELET
Basophils Absolute: 0 10*3/uL (ref 0.0–0.1)
Basophils Relative: 0 %
EOS PCT: 2 %
Eosinophils Absolute: 0.1 10*3/uL (ref 0.0–0.7)
HCT: 35.5 % — ABNORMAL LOW (ref 36.0–46.0)
Hemoglobin: 11.2 g/dL — ABNORMAL LOW (ref 12.0–15.0)
LYMPHS ABS: 1.6 10*3/uL (ref 0.7–4.0)
LYMPHS PCT: 31 %
MCH: 30.6 pg (ref 26.0–34.0)
MCHC: 31.5 g/dL (ref 30.0–36.0)
MCV: 97 fL (ref 78.0–100.0)
Monocytes Absolute: 0.6 10*3/uL (ref 0.1–1.0)
Monocytes Relative: 11 %
Neutro Abs: 2.9 10*3/uL (ref 1.7–7.7)
Neutrophils Relative %: 56 %
PLATELETS: 136 10*3/uL — AB (ref 150–400)
RBC: 3.66 MIL/uL — ABNORMAL LOW (ref 3.87–5.11)
RDW: 18.6 % — AB (ref 11.5–15.5)
WBC: 5.3 10*3/uL (ref 4.0–10.5)

## 2016-04-26 LAB — COMPREHENSIVE METABOLIC PANEL
ALT: 48 U/L (ref 14–54)
AST: 38 U/L (ref 15–41)
Albumin: 2.9 g/dL — ABNORMAL LOW (ref 3.5–5.0)
Alkaline Phosphatase: 106 U/L (ref 38–126)
Anion gap: 11 (ref 5–15)
BILIRUBIN TOTAL: 0.7 mg/dL (ref 0.3–1.2)
BUN: 19 mg/dL (ref 6–20)
CO2: 24 mmol/L (ref 22–32)
Calcium: 8.5 mg/dL — ABNORMAL LOW (ref 8.9–10.3)
Chloride: 102 mmol/L (ref 101–111)
Creatinine, Ser: 1.1 mg/dL — ABNORMAL HIGH (ref 0.44–1.00)
GFR calc Af Amer: 59 mL/min — ABNORMAL LOW (ref >60)
GFR calc non Af Amer: 51 mL/min — ABNORMAL LOW (ref >60)
Glucose, Bld: 98 mg/dL (ref 65–99)
POTASSIUM: 3.2 mmol/L — AB (ref 3.5–5.1)
Sodium: 137 mmol/L (ref 135–145)
Total Protein: 6.7 g/dL (ref 6.5–8.1)

## 2016-04-26 LAB — URINALYSIS, ROUTINE W REFLEX MICROSCOPIC
Bilirubin Urine: NEGATIVE
Glucose, UA: NEGATIVE mg/dL
HGB URINE DIPSTICK: NEGATIVE
Ketones, ur: NEGATIVE mg/dL
Nitrite: NEGATIVE
PROTEIN: NEGATIVE mg/dL
Specific Gravity, Urine: 1.017 (ref 1.005–1.030)
pH: 5 (ref 5.0–8.0)

## 2016-04-26 LAB — LACTIC ACID, PLASMA: Lactic Acid, Venous: 1.5 mmol/L (ref 0.5–1.9)

## 2016-04-26 LAB — I-STAT CG4 LACTIC ACID, ED: LACTIC ACID, VENOUS: 2.94 mmol/L — AB (ref 0.5–1.9)

## 2016-04-26 MED ORDER — DEXTROSE 5 % IV SOLN: 1.0000 g | Freq: Three times a day (TID) | INTRAVENOUS | Status: DC

## 2016-04-26 MED ORDER — VANCOMYCIN HCL IN DEXTROSE 1-5 GM/200ML-% IV SOLN
1000.0000 mg | Freq: Once | INTRAVENOUS | Status: AC
  Administered 2016-04-26: 1000 mg via INTRAVENOUS
  Filled 2016-04-26: qty 200

## 2016-04-26 MED ORDER — POTASSIUM CHLORIDE 20 MEQ/15ML (10%) PO SOLN
60.0000 meq | Freq: Every day | ORAL | Status: DC
  Administered 2016-04-27 – 2016-05-01 (×5): 60 meq via ORAL
  Filled 2016-04-26 (×6): qty 60

## 2016-04-26 MED ORDER — METOPROLOL TARTRATE 50 MG PO TABS
50.0000 mg | ORAL_TABLET | Freq: Two times a day (BID) | ORAL | Status: DC
  Administered 2016-04-26 – 2016-05-01 (×10): 50 mg via ORAL
  Filled 2016-04-26 (×10): qty 1

## 2016-04-26 MED ORDER — MOMETASONE FURO-FORMOTEROL FUM 200-5 MCG/ACT IN AERO
2.0000 | INHALATION_SPRAY | Freq: Two times a day (BID) | RESPIRATORY_TRACT | Status: DC
  Administered 2016-04-26 – 2016-05-01 (×9): 2 via RESPIRATORY_TRACT
  Filled 2016-04-26: qty 8.8

## 2016-04-26 MED ORDER — SODIUM CHLORIDE 0.9 % IV SOLN
INTRAVENOUS | Status: AC
  Filled 2016-04-26: qty 1

## 2016-04-26 MED ORDER — METRONIDAZOLE IN NACL 5-0.79 MG/ML-% IV SOLN
500.0000 mg | Freq: Once | INTRAVENOUS | Status: AC
  Administered 2016-04-26: 500 mg via INTRAVENOUS
  Filled 2016-04-26: qty 100

## 2016-04-26 MED ORDER — ONDANSETRON HCL 4 MG/2ML IJ SOLN
4.0000 mg | Freq: Four times a day (QID) | INTRAMUSCULAR | Status: DC | PRN
  Administered 2016-04-28 – 2016-05-01 (×3): 4 mg via INTRAVENOUS
  Filled 2016-04-26 (×3): qty 2

## 2016-04-26 MED ORDER — ONDANSETRON HCL 4 MG PO TABS
4.0000 mg | ORAL_TABLET | Freq: Four times a day (QID) | ORAL | Status: DC | PRN
  Administered 2016-04-27: 4 mg via ORAL
  Filled 2016-04-26: qty 1

## 2016-04-26 MED ORDER — VANCOMYCIN HCL IN DEXTROSE 1-5 GM/200ML-% IV SOLN: 1000.0000 mg | Freq: Once | INTRAVENOUS | Status: DC

## 2016-04-26 MED ORDER — SODIUM CHLORIDE 0.9 % IV SOLN
1.0000 g | Freq: Once | INTRAVENOUS | Status: AC
  Administered 2016-04-26: 1 g via INTRAVENOUS
  Filled 2016-04-26: qty 1

## 2016-04-26 MED ORDER — ALBUTEROL SULFATE (2.5 MG/3ML) 0.083% IN NEBU
INHALATION_SOLUTION | RESPIRATORY_TRACT | Status: AC
  Administered 2016-04-26: 2.5 mg
  Filled 2016-04-26: qty 3

## 2016-04-26 MED ORDER — ACETAMINOPHEN 650 MG RE SUPP: 650.0000 mg | Freq: Four times a day (QID) | RECTAL | Status: DC | PRN

## 2016-04-26 MED ORDER — RIVAROXABAN 20 MG PO TABS
20.0000 mg | ORAL_TABLET | Freq: Every day | ORAL | Status: DC
  Filled 2016-04-26: qty 1

## 2016-04-26 MED ORDER — IPRATROPIUM BROMIDE 0.02 % IN SOLN
RESPIRATORY_TRACT | Status: AC
  Administered 2016-04-26: 0.5 mg
  Filled 2016-04-26: qty 2.5

## 2016-04-26 MED ORDER — SODIUM CHLORIDE 0.9 % IV SOLN
1.0000 g | Freq: Three times a day (TID) | INTRAVENOUS | Status: DC
  Administered 2016-04-26 – 2016-04-30 (×12): 1 g via INTRAVENOUS
  Filled 2016-04-26 (×14): qty 1

## 2016-04-26 MED ORDER — DILTIAZEM HCL ER COATED BEADS 180 MG PO CP24
360.0000 mg | ORAL_CAPSULE | Freq: Every day | ORAL | Status: DC
  Administered 2016-04-27 – 2016-05-01 (×5): 360 mg via ORAL
  Filled 2016-04-26 (×6): qty 2

## 2016-04-26 MED ORDER — DEXTROSE 5 % IV SOLN
2.0000 g | Freq: Once | INTRAVENOUS | Status: DC
  Filled 2016-04-26: qty 2

## 2016-04-26 MED ORDER — IPRATROPIUM-ALBUTEROL 0.5-2.5 (3) MG/3ML IN SOLN
3.0000 mL | Freq: Four times a day (QID) | RESPIRATORY_TRACT | Status: DC
  Administered 2016-04-26 – 2016-05-01 (×19): 3 mL via RESPIRATORY_TRACT
  Filled 2016-04-26 (×19): qty 3

## 2016-04-26 MED ORDER — SODIUM CHLORIDE 0.9 % IV BOLUS (SEPSIS)
500.0000 mL | Freq: Once | INTRAVENOUS | Status: AC
  Administered 2016-04-26: 500 mL via INTRAVENOUS

## 2016-04-26 MED ORDER — PANTOPRAZOLE SODIUM 40 MG PO TBEC
40.0000 mg | DELAYED_RELEASE_TABLET | Freq: Every day | ORAL | Status: DC
  Administered 2016-04-27 – 2016-05-01 (×5): 40 mg via ORAL
  Filled 2016-04-26 (×6): qty 1

## 2016-04-26 MED ORDER — SODIUM CHLORIDE 0.9 % IV BOLUS (SEPSIS)
1000.0000 mL | Freq: Once | INTRAVENOUS | Status: AC
  Administered 2016-04-26: 500 mL via INTRAVENOUS

## 2016-04-26 MED ORDER — HYDROCODONE-ACETAMINOPHEN 5-325 MG PO TABS
2.0000 | ORAL_TABLET | Freq: Once | ORAL | Status: AC
  Administered 2016-04-26: 2 via ORAL
  Filled 2016-04-26: qty 2

## 2016-04-26 MED ORDER — MOMETASONE FURO-FORMOTEROL FUM 200-5 MCG/ACT IN AERO
INHALATION_SPRAY | RESPIRATORY_TRACT | Status: AC
  Filled 2016-04-26: qty 8.8

## 2016-04-26 MED ORDER — VANCOMYCIN HCL IN DEXTROSE 1-5 GM/200ML-% IV SOLN: 1000.0000 mg | INTRAVENOUS | Status: DC

## 2016-04-26 MED ORDER — SODIUM CHLORIDE 0.9% FLUSH
3.0000 mL | INTRAVENOUS | Status: DC | PRN
  Administered 2016-04-27 – 2016-04-30 (×2): 3 mL via INTRAVENOUS
  Filled 2016-04-26 (×2): qty 3

## 2016-04-26 MED ORDER — FUROSEMIDE 40 MG PO TABS
40.0000 mg | ORAL_TABLET | Freq: Every day | ORAL | Status: DC
  Filled 2016-04-26 (×2): qty 1

## 2016-04-26 MED ORDER — SODIUM CHLORIDE 0.9 % IV SOLN: 250.0000 mL | INTRAVENOUS | Status: DC | PRN

## 2016-04-26 MED ORDER — SODIUM CHLORIDE 0.9% FLUSH
3.0000 mL | Freq: Two times a day (BID) | INTRAVENOUS | Status: DC
  Administered 2016-04-26 – 2016-04-30 (×5): 3 mL via INTRAVENOUS

## 2016-04-26 MED ORDER — ACETAMINOPHEN 325 MG PO TABS: 650.0000 mg | ORAL_TABLET | Freq: Four times a day (QID) | ORAL | Status: DC | PRN

## 2016-04-26 MED ORDER — GUAIFENESIN ER 600 MG PO TB12
1200.0000 mg | ORAL_TABLET | Freq: Two times a day (BID) | ORAL | Status: DC
  Administered 2016-04-26 – 2016-05-01 (×10): 1200 mg via ORAL
  Filled 2016-04-26 (×10): qty 2

## 2016-04-26 MED ORDER — METRONIDAZOLE IN NACL 5-0.79 MG/ML-% IV SOLN: 500.0000 mg | Freq: Three times a day (TID) | INTRAVENOUS | Status: DC

## 2016-04-26 MED ORDER — ORAL CARE MOUTH RINSE
15.0000 mL | Freq: Two times a day (BID) | OROMUCOSAL | Status: DC
  Administered 2016-04-26 – 2016-05-01 (×10): 15 mL via OROMUCOSAL

## 2016-04-26 MED ORDER — HYDROCODONE-ACETAMINOPHEN 10-325 MG PO TABS
1.0000 | ORAL_TABLET | Freq: Four times a day (QID) | ORAL | Status: DC | PRN
  Administered 2016-04-26 – 2016-05-01 (×19): 1 via ORAL
  Filled 2016-04-26 (×20): qty 1

## 2016-04-26 NOTE — ED Notes (Signed)
Floor unable to take report.

## 2016-04-26 NOTE — Progress Notes (Signed)
Pharmacy Antibiotic Note  Lindsay Salazar is a 66 y.o. female admitted on 04/26/2016 with sepsis. / cellulitis  Pharmacy has been consulted for VANCOMYCIN, AZTREONAM, FLAGYL dosing.  Plan:  Vancomycin 1000mg  IV q24h Check trough at steady state Flagyl 500mg  IV q8h Aztreonam 1gm IV q8h Monitor labs, renal fxn, progress and c/s Deescalate ABX when improved / appropriate.    Height: 5\' 7"  (170.2 cm) Weight: 128 lb (58.1 kg) IBW/kg (Calculated) : 61.6  Temp (24hrs), Avg:99.1 F (37.3 C), Min:99.1 F (37.3 C), Max:99.1 F (37.3 C)   Recent Labs Lab 04/24/16 0958 04/26/16 1234 04/26/16 1305  WBC 7.9 5.3  --   CREATININE 1.27* 1.10*  --   LATICACIDVEN  --   --  2.94*    Estimated Creatinine Clearance: 45.5 mL/min (A) (by C-G formula based on SCr of 1.1 mg/dL (H)).    Allergies  Allergen Reactions  . Ibuprofen Nausea And Vomiting  . Other     Seafood   . Penicillins Swelling    Has patient had a PCN reaction causing immediate rash, facial/tongue/throat swelling, SOB or lightheadedness with hypotension: Yes Has patient had a PCN reaction causing severe rash involving mucus membranes or skin necrosis: No Has patient had a PCN reaction that required hospitalization No Has patient had a PCN reaction occurring within the last 10 years: No If all of the above answers are "NO", then may proceed with Cephalosporin use.   . Tiotropium Bromide Monohydrate Itching  . Tramadol Nausea And Vomiting   Antimicrobials this admission: Vanc 3/24 >>  Flagyl 3/24 >>  Aztreonam 3/24 >>  Dose adjustments this admission:  Microbiology results:  BCx: pending  UCx: pending   Sputum:    MRSA PCR:   Thank you for allowing pharmacy to be a part of this patient's care.  [name] A 04/26/2016 1:59 PM

## 2016-04-26 NOTE — ED Provider Notes (Signed)
Emergency Department Provider Note   I have reviewed the triage vital signs and the nursing notes.   HISTORY  Chief Complaint Cellulitis   HPI Lindsay Salazar is a 66 y.o. female with PMH of pancreatic cancer currently on chemotherapy, A-fib, COPD on 2L home O2, HTN, GERD, and Fibromyalgia presents to the emergency department for evaluation of persistent/worsening right lower extremity pain, swelling, redness. Patient endorses 2-3 weeks of symptoms. The rash started on the foot and has slowly progressed up the leg. She saw her primary care physician who started her on outpatient clindamycin. She completed a course of this yesterday but symptoms did not improve. She reports subjective fever and chills over the last several days. The leg has severe pain with palpation or movement. No radiation of pain. No alleviating factors.    Past Medical History:  Diagnosis Date  . Anginal pain ([place])    thought she was having   heartburn  . Arthritis    bursitis , fibromyalgia  . Arthritis    "right hip" (11/27/2015)  . Asthma   . Atrial fibrillation ([place])   . Chronic bronchitis ([place])   . Chronic right hip pain   . COPD (chronic obstructive pulmonary disease) ([place])    wears home o2 prn  . Daily headache    "last 2-3 months" (11/27/2015)  . Exposure to TB    "mom had it when she was pregnant with me"  . Fibromyalgia   . GERD (gastroesophageal reflux disease)    use to have it but not now  . Heart murmur    32-  67 years old  . Hypertension   . On home oxygen therapy    11/27/2015 "whenever I need it; don't know how many liters"  . Pancreatic cancer ([place])   . Pneumonia    "several times" (11/27/2015)  . Pneumothorax     Patient Active Problem List   Diagnosis Date Noted  . Cellulitis 04/26/2016  . COPD (chronic obstructive pulmonary disease) ([place]) 04/26/2016  . Chronic respiratory failure with hypoxia ([place]) 04/26/2016  . Hypoxemia 03/30/2016  . Acute on chronic respiratory failure  with hypoxia ([place]) 03/29/2016  . Leukocytosis 03/29/2016  . Anemia of chronic disease 03/29/2016  . Essential hypertension 03/29/2016  . HCAP (healthcare-associated pneumonia) 03/28/2016  . B12 deficiency 01/24/2016  . COPD with acute exacerbation ([place]) 01/20/2016  . Pancreatic cancer ([place]) 12/07/2015  . Chronic anticoagulation-Xarelto 09/06/2015  . Gastrointestinal hemorrhage with melena 08/08/2015  . Atrial fibrillation ([place]) 07/17/2015    Past Surgical History:  Procedure Laterality Date  . ANTERIOR CERVICAL DECOMP/DISCECTOMY FUSION     "put 4 screws in"  . BACK SURGERY    . CARDIAC CATHETERIZATION  08/2004   Archie Endo 06/18/2010  . CARDIOVERSION N/A 07/18/2015   Procedure: CARDIOVERSION;  Surgeon: Josue Hector, MD;  [facility];  Service: Cardiovascular;  Laterality: N/A;  . ESOPHAGOGASTRODUODENOSCOPY N/A 08/09/2015   Procedure: ESOPHAGOGASTRODUODENOSCOPY (EGD);  Surgeon: Rogene Houston, MD;  Location: [facility];  Service: Endoscopy;  Laterality: N/A;  . EUS N/A 11/29/2015   Procedure: UPPER ENDOSCOPIC ULTRASOUND (EUS) RADIAL;  Surgeon: Milus Banister, MD;  Location: [facility];  Service: Endoscopy;  Laterality: N/A;  . IR GENERIC HISTORICAL  12/18/2015   IR US GUIDE VASC ACCESS RIGHT 12/18/2015 Sandi Mariscal, MD [facility]  . IR GENERIC HISTORICAL  12/18/2015   IR FLUORO GUIDE PORT INSERTION RIGHT 12/18/2015 Sandi Mariscal, MD [facility]  . TONSILLECTOMY    .  TUBAL LIGATION        Allergies Ibuprofen; Other; Penicillins; Tiotropium bromide monohydrate; and Tramadol  Family History  Problem Relation Age of Onset  . COPD Mother   . Diabetes Father   . Stroke Father     Social History Social History  Substance Use Topics  . Smoking status: Former Smoker    Packs/day: 0.50    Years: 46.00    Types: Cigarettes    Start date: 10/09/1969    Quit date: 11/29/2015  . Smokeless tobacco: Never Used  . Alcohol use No     Comment: quit 30 years ago     Review of Systems  10-point ROS otherwise negative.  ____________________________________________   PHYSICAL EXAM:  VITAL SIGNS: ED Triage Vitals  Enc Vitals Group     BP 04/26/16 1135 97/82     Pulse Rate 04/26/16 1139 73     Resp 04/26/16 1135 (!) 22     Temp 04/26/16 1142 99.1 F (37.3 C)     Temp Source 04/26/16 1142 Rectal     SpO2 04/26/16 1139 100 %     Weight 04/26/16 1128 128 lb (58.1 kg)     Height 04/26/16 1128 5\' 4"  (1.626 m)     Pain Score 04/26/16 1128 10   Constitutional: Alert and oriented. Well appearing and in no acute distress. Eyes: Conjunctivae are normal.  Head: Atraumatic. Nose: No congestion/rhinnorhea. Mouth/Throat: Mucous membranes are dry.  Oropharynx non-erythematous. Neck: No stridor.   Cardiovascular: Normal rate, regular rhythm. Good peripheral circulation. Grossly normal heart sounds.   Respiratory: Normal respiratory effort.  No retractions. Lungs CTAB. Gastrointestinal: Soft and nontender. No distention.  Musculoskeletal: No lower extremity tenderness nor edema. No gross deformities of extremities. Neurologic:  Normal speech and language. No gross focal neurologic deficits are appreciated.  Skin:  Skin is warm, dry and intact. Swelling and diffuse erythema extending from the right foot to the mid right calf. Normal pulses and sensation. Area with warm and tender to touch.  Psychiatric: Mood and affect are normal. Speech and behavior are normal.  ____________________________________________   LABS (all labs ordered are listed, but only abnormal results are displayed)  Labs Reviewed  COMPREHENSIVE METABOLIC PANEL - Abnormal; Notable for the following:       Result Value   Potassium 3.2 (*)    Creatinine, Ser 1.10 (*)    Calcium 8.5 (*)    Albumin 2.9 (*)    GFR calc non Af Amer 51 (*)    GFR calc Af Amer 59 (*)    All other components within normal limits  CBC WITH DIFFERENTIAL/PLATELET - Abnormal; Notable for the following:     RBC 3.66 (*)    Hemoglobin 11.2 (*)    HCT 35.5 (*)    RDW 18.6 (*)    Platelets 136 (*)    All other components within normal limits  I-STAT CG4 LACTIC ACID, ED - Abnormal; Notable for the following:    Lactic Acid, Venous 2.94 (*)    All other components within normal limits  CULTURE, BLOOD (ROUTINE X 2)  CULTURE, BLOOD (ROUTINE X 2)  LACTIC ACID, PLASMA  URINALYSIS, ROUTINE W REFLEX MICROSCOPIC  CBC  BASIC METABOLIC PANEL  URINALYSIS, ROUTINE W REFLEX MICROSCOPIC   ____________________________________________  RADIOLOGY  Dg Foot 2 Views Right  Result Date: 04/26/2016 CLINICAL DATA:  66 year old female with a history of redness swelling and pain no injury. EXAM: RIGHT FOOT - 2 VIEW COMPARISON:  None. FINDINGS: Pronounced  swelling of the right foot and ankle, most evident on the lateral view. No radiopaque foreign body. No displaced fracture. Mild degenerative changes of the forefoot, midfoot, hindfoot. IMPRESSION: Nonspecific soft tissue swelling of the right foot and ankle. No acute fracture identified. Electronically Signed   By: Corrie Mckusick D.O.   On: 04/26/2016 12:49    ____________________________________________   PROCEDURES  Procedure(s) performed:   Procedures  CRITICAL CARE Performed by: Margette Fast Total critical care time: 30 minutes Critical care time was exclusive of separately billable procedures and treating other patients. Critical care was necessary to treat or prevent imminent or life-threatening deterioration. Critical care was time spent personally by me on the following activities: development of treatment plan with patient and/or surrogate as well as nursing, discussions with consultants, evaluation of patient's response to treatment, examination of patient, obtaining history from patient or surrogate, ordering and performing treatments and interventions, ordering and review of laboratory studies, ordering and review of radiographic studies,  pulse oximetry and re-evaluation of patient's condition.  Nanda Quinton, MD Emergency Medicine  ____________________________________________   INITIAL IMPRESSION / ASSESSMENT AND PLAN / ED COURSE  Pertinent labs & imaging results that were available during my care of the patient were reviewed by me and considered in my medical decision making (see chart for details).  She presents to the emergency department for evaluation of right lower extremity erythema, swelling, pain consistent with cellulitis. Patient does have a slightly dusky area on the top of her foot which may represent fluid and possible developing ulceration. Will obtain x-ray of the foot to evaluate for possible osteomyelitis. The erythema has been slowly progressing over 2-3 weeks I have lower suspicion for fasciitis. The patient is overall well-appearing with tachycardia on arrival but normal blood pressure and no other specific surgical criteria. Very low suspicion for underlying DVT as the etiology of symptoms. Plan to start antibiotics and obtain blood cultures along with lactic acid and other sepsis labs. Patient with no other obvious site of infection.   Patient with lactate of almost 3. Activated sepsis bundle previously. IVF given but will not require 30 ml/kg with lactate < 4 and no hypotension. Added additional abx coverage.   Discussed patient's case with hospitalist. Patient and family (if present) updated with plan. Care transferred to hospitalist service.  I reviewed all nursing notes, vitals, pertinent old records, EKGs, labs, imaging (as available).  ____________________________________________  FINAL CLINICAL IMPRESSION(S) / ED DIAGNOSES  Final diagnoses:  Cellulitis of right lower extremity  Sepsis, due to unspecified organism [facility])     MEDICATIONS GIVEN DURING THIS VISIT:  Medications  ipratropium-albuterol (DUONEB) 0.5-2.5 (3) MG/3ML nebulizer solution 3 mL (3 mLs Nebulization Not Given 04/26/16 1635)   HYDROcodone-acetaminophen (NORCO) 10-325 MG per tablet 1 tablet (1 tablet Oral Given 04/26/16 1851)  guaiFENesin (MUCINEX) 12 hr tablet 1,200 mg (not administered)  potassium chloride 20 MEQ/15ML (10%) solution 60 mEq (60 mEq Oral Not Given 04/26/16 1745)  metoprolol (LOPRESSOR) tablet 50 mg (not administered)  diltiazem (CARDIZEM CD) 24 hr capsule 360 mg (360 mg Oral Not Given 04/26/16 1800)  pantoprazole (PROTONIX) EC tablet 40 mg (40 mg Oral Not Given 04/26/16 1745)  mometasone-formoterol (DULERA) 200-5 MCG/ACT inhaler 2 puff (not administered)  rivaroxaban (XARELTO) tablet 20 mg (20 mg Oral Not Given 04/26/16 1745)  meropenem (MERREM) 1 g in sodium chloride 0.9 % 100 mL IVPB (1 g Intravenous Given 04/26/16 1902)  acetaminophen (TYLENOL) tablet 650 mg (not administered)    Or  acetaminophen (TYLENOL) suppository 650 mg (not administered)  ondansetron (ZOFRAN) tablet 4 mg (not administered)    Or  ondansetron (ZOFRAN) injection 4 mg (not administered)  sodium chloride flush (NS) 0.9 % injection 3 mL (not administered)  sodium chloride flush (NS) 0.9 % injection 3 mL (not administered)  0.9 %  sodium chloride infusion (not administered)  furosemide (LASIX) tablet 40 mg (40 mg Oral Not Given 04/26/16 1745)  meropenem (MERREM) 1 g in sodium chloride 0.9 % 100 mL IVPB (1 g Intravenous Given 04/26/16 1859)  MEDLINE mouth rinse (not administered)  sodium chloride 0.9 % bolus 500 mL (0 mLs Intravenous Stopped 04/26/16 1400)  HYDROcodone-acetaminophen (NORCO/VICODIN) 5-325 MG per tablet 2 tablet (2 tablets Oral Given 04/26/16 1301)  vancomycin (VANCOCIN) IVPB 1000 mg/200 mL premix (0 mg Intravenous Stopped 04/26/16 1419)  sodium chloride 0.9 % bolus 1,000 mL (0 mLs Intravenous Stopped 04/26/16 1526)  metroNIDAZOLE (FLAGYL) IVPB 500 mg (0 mg Intravenous Stopped 04/26/16 1526)  ipratropium (ATROVENT) 0.02 % nebulizer solution (0.5 mg  Given 04/26/16 1636)  albuterol (PROVENTIL) (2.5 MG/3ML) 0.083% nebulizer  solution (2.5 mg  Given 04/26/16 1636)     NEW OUTPATIENT MEDICATIONS STARTED DURING THIS VISIT:  None   Note:  This document was prepared using Dragon voice recognition software and may include unintentional dictation errors.  Nanda Quinton, MD Emergency Medicine   Margette Fast, MD 04/26/16 [id number]

## 2016-04-26 NOTE — H&P (Signed)
History and Physical    Lindsay Salazar BJY:[id number] DOB: [birth year] DOA: 04/26/2016  PCP: [name], PA-C  Patient coming from: Home  I have personally briefly reviewed patient's old medical records in [place]  Chief Complaint: Pain in right foot  HPI: Lindsay Salazar is a 66 y.o. female with medical history significant of chronic respiratory failure on oxygen, COPD, atrial fibrillation on anticoagulation, presents to the hospital with worsening swelling in her right lower extremity. Patient reports over the past 3 weeks she's had swelling, redness and pain in her right lower tremor. She had gone her primary care physician 2 days ago and received a prescription for clindamycin. She reports that initially her cellulitis appeared to be improving, but this morning it had particularly gotten worse. She has worsening erythema, pain. She has noticed some drainage from multiple areas in her leg/foot. She does not felt that she had any fevers. No other new complaints.  ED Course: In the emergency room, she was noted to be mildly tachycardic atrial flutter which  improved after receiving some IV fluids. X-ray of her foot did not show any evidence of osteomyelitis. Labs were relatively unremarkable. Lactic acid was normal on first check, repeat appeared to be elevated. She does not appear septic or toxic. Due to her significant cellulitis, she was referred for admission.  Review of Systems: As per HPI otherwise 10 point review of systems negative.    Past Medical History:  Diagnosis Date  . Anginal pain ([place])    thought she was having   heartburn  . Arthritis    bursitis , fibromyalgia  . Arthritis    "right hip" (11/27/2015)  . Asthma   . Atrial fibrillation ([place])   . Chronic bronchitis ([place])   . Chronic right hip pain   . COPD (chronic obstructive pulmonary disease) ([place])    wears home o2 prn  . Daily headache    "last 2-3 months" (11/27/2015)  . Exposure to TB    "mom had it  when she was pregnant with me"  . Fibromyalgia   . GERD (gastroesophageal reflux disease)    use to have it but not now  . Heart murmur    72-  89 years old  . Hypertension   . On home oxygen therapy    11/27/2015 "whenever I need it; don't know how many liters"  . Pancreatic cancer ([place])   . Pneumonia    "several times" (11/27/2015)  . Pneumothorax     Past Surgical History:  Procedure Laterality Date  . ANTERIOR CERVICAL DECOMP/DISCECTOMY FUSION     "put 4 screws in"  . BACK SURGERY    . CARDIAC CATHETERIZATION  08/2004   Archie Endo 06/18/2010  . CARDIOVERSION N/A 07/18/2015   Procedure: CARDIOVERSION;  Surgeon: Josue Hector, MD;  [facility];  Service: Cardiovascular;  Laterality: N/A;  . ESOPHAGOGASTRODUODENOSCOPY N/A 08/09/2015   Procedure: ESOPHAGOGASTRODUODENOSCOPY (EGD);  Surgeon: Rogene Houston, MD;  [facility];  Service: Endoscopy;  Laterality: N/A;  . EUS N/A 11/29/2015   Procedure: UPPER ENDOSCOPIC ULTRASOUND (EUS) RADIAL;  Surgeon: Milus Banister, MD;  [facility];  Service: Endoscopy;  Laterality: N/A;  . IR GENERIC HISTORICAL  12/18/2015   IR US GUIDE VASC ACCESS RIGHT 12/18/2015 Sandi Mariscal, MD [facility]  . IR GENERIC HISTORICAL  12/18/2015   IR FLUORO GUIDE PORT INSERTION RIGHT 12/18/2015 Sandi Mariscal, MD [facility]  . TONSILLECTOMY    .  TUBAL LIGATION       reports that she quit smoking about 4 months ago. Her smoking use included Cigarettes. She started smoking about 46 years ago. She has a 23.00 pack-year smoking history. She has never used smokeless tobacco. She reports that she does not drink alcohol or use drugs.  Allergies  Allergen Reactions  . Ibuprofen Nausea And Vomiting  . Other     Seafood   . Penicillins Swelling    Has patient had a PCN reaction causing immediate rash, facial/tongue/throat swelling, SOB or lightheadedness with hypotension: Yes Has patient had a PCN reaction causing severe rash involving  mucus membranes or skin necrosis: No Has patient had a PCN reaction that required hospitalization No Has patient had a PCN reaction occurring within the last 10 years: No If all of the above answers are "NO", then may proceed with Cephalosporin use.   . Tiotropium Bromide Monohydrate Itching  . Tramadol Nausea And Vomiting    Family History  Problem Relation Age of Onset  . COPD Mother   . Diabetes Father   . Stroke Father      Prior to Admission medications   Medication Sig Start Date End Date Taking? Authorizing Provider  COMBIVENT RESPIMAT 20-100 MCG/ACT AERS respimat Inhale 1 puff into the lungs 2 (two) times daily. 12/03/15  Yes Ripudeep Krystal Eaton, MD  diltiazem (CARDIZEM CD) 360 MG 24 hr capsule Take 1 capsule (360 mg total) by mouth daily. 02/06/16  Yes Kathie Dike, MD  Gemcitabine HCl (GEMZAR IV) Inject into the vein. Day 1, day 8, day 15, every 28 days   Yes Historical Provider, MD  guaiFENesin (MUCINEX) 600 MG 12 hr tablet Take 2 tablets (1,200 mg total) by mouth 2 (two) times daily. 04/04/16  Yes Ripudeep Krystal Eaton, MD  HYDROcodone-acetaminophen (NORCO) 10-325 MG tablet Take 1 tablet by mouth every 6 (six) hours as needed. 04/16/16  Yes Thomas S Kefalas, PA-C  ipratropium-albuterol (DUONEB) 0.5-2.5 (3) MG/3ML SOLN Inhale 3 mLs into the lungs every 6 (six) hours as needed (for shortness of breath).  03/03/16  Yes Historical Provider, MD  metoprolol tartrate (LOPRESSOR) 50 MG tablet Take 1 tablet (50 mg total) by mouth 2 (two) times daily. 03/02/16  Yes Kathie Dike, MD  ondansetron (ZOFRAN) 8 MG tablet Take 1 tablet by mouth daily as needed for nausea/vomiting. 12/10/15  Yes Historical Provider, MD  OXYGEN Inhale 2 L into the lungs daily as needed (for breathing).   Yes Historical Provider, MD  PACLitaxel Protein-Bound Part (ABRAXANE IV) Inject into the vein. Day 1, day 8, day 15, every 28 days   Yes Historical Provider, MD  pantoprazole (PROTONIX) 40 MG tablet Take 1 tablet (40 mg total)  by mouth daily. 12/03/15  Yes Ripudeep K Rai, MD  potassium chloride 20 MEQ/15ML (10%) SOLN Take 45 mLs (60 mEq total) by mouth daily. 04/16/16  Yes Baird Cancer, PA-C  prochlorperazine (COMPAZINE) 10 MG tablet Take 1 tablet by mouth daily as needed for nausea/vomiting. 12/10/15  Yes Historical Provider, MD  SYMBICORT 160-4.5 MCG/ACT inhaler Inhale 1 puff into the lungs 2 (two) times daily. 12/03/15  Yes Ripudeep Krystal Eaton, MD  rivaroxaban (XARELTO) 20 MG TABS tablet Take 1 tablet (20 mg total) by mouth daily with supper. Patient not taking: Reported on 04/16/2016 04/04/16   Ripudeep Krystal Eaton, MD    Physical Exam: Vitals:   04/26/16 1430 04/26/16 1500 04/26/16 1545 04/26/16 1636  BP: 119/61 (!) 110/49 (!) 106/54   Pulse: Marland Kitchen)  58 61 66   Resp: 19 18 20    Temp:   98 F (36.7 C)   TempSrc:   Oral   SpO2: 97% 93% 90% 92%  Weight:      Height:        Constitutional: NAD, calm, comfortable Vitals:   04/26/16 1430 04/26/16 1500 04/26/16 1545 04/26/16 1636  BP: 119/61 (!) 110/49 (!) 106/54   Pulse: (!) 58 61 66   Resp: 19 18 20    Temp:   98 F (36.7 C)   TempSrc:   Oral   SpO2: 97% 93% 90% 92%  Weight:      Height:       Eyes: PERRL, lids and conjunctivae normal ENMT: Mucous membranes are moist. Posterior pharynx clear of any exudate or lesions.Normal dentition.  Neck: normal, supple, no masses, no thyromegaly Respiratory: Diminished breath sounds with mild wheeze bilaterally. Normal respiratory effort. No accessory muscle use.  Cardiovascular: Irregular, no murmurs / rubs / gallops. No carotid bruits.  Abdomen: no tenderness, no masses palpated. No hepatosplenomegaly. Bowel sounds positive.  Musculoskeletal: no clubbing / cyanosis. No joint deformity upper and lower extremities. Good ROM, no contractures. Normal muscle tone.  Skin: Erythema over her right lower extremity. Skin is tender to touch. Warm to touch. Left large cavity has 1+ edema, right lower, he has 2+ edema. Capillary refill  is less than 2 seconds. Pulses are difficult to palpate due to swelling. Neurologic: CN 2-12 grossly intact. Sensation intact, DTR normal. Strength 5/5 in all 4.  Psychiatric: Normal judgment and insight. Alert and oriented x 3. Normal mood.   Labs on Admission: I have personally reviewed following labs and imaging studies  CBC:  Recent Labs Lab 04/24/16 0958 04/26/16 1234  WBC 7.9 5.3  NEUTROABS 6.0 2.9  HGB 9.7* 11.2*  HCT 30.5* 35.5*  MCV 98.1 97.0  PLT 82* 700*   Basic Metabolic Panel:  Recent Labs Lab 04/24/16 0958 04/26/16 1234  NA 134* 137  K 3.6 3.2*  CL 100* 102  CO2 22 24  GLUCOSE 123* 98  BUN 24* 19  CREATININE 1.27* 1.10*  CALCIUM 8.4* 8.5*   GFR: Estimated Creatinine Clearance: 45.5 mL/min (A) (by C-G formula based on SCr of 1.1 mg/dL (H)). Liver Function Tests:  Recent Labs Lab 04/24/16 0958 04/26/16 1234  AST 47* 38  ALT 59* 48  ALKPHOS 102 106  BILITOT 0.5 0.7  PROT 6.5 6.7  ALBUMIN 2.8* 2.9*   No results for input(s): LIPASE, AMYLASE in the last 168 hours. No results for input(s): AMMONIA in the last 168 hours. Coagulation Profile: No results for input(s): INR, PROTIME in the last 168 hours. Cardiac Enzymes: No results for input(s): CKTOTAL, CKMB, CKMBINDEX, TROPONINI in the last 168 hours. BNP (last 3 results) No results for input(s): PROBNP in the last 8760 hours. HbA1C: No results for input(s): HGBA1C in the last 72 hours. CBG: No results for input(s): GLUCAP in the last 168 hours. Lipid Profile: No results for input(s): CHOL, HDL, LDLCALC, TRIG, CHOLHDL, LDLDIRECT in the last 72 hours. Thyroid Function Tests: No results for input(s): TSH, T4TOTAL, FREET4, T3FREE, THYROIDAB in the last 72 hours. Anemia Panel: No results for input(s): VITAMINB12, FOLATE, FERRITIN, TIBC, IRON, RETICCTPCT in the last 72 hours. Urine analysis:    Component Value Date/Time   COLORURINE AMBER (A) 03/28/2016 1841   APPEARANCEUR HAZY (A) 03/28/2016  1841   LABSPEC 1.019 03/28/2016 1841   PHURINE 7.0 03/28/2016 1841   GLUCOSEU NEGATIVE 03/28/2016  [place] (A) 03/28/2016 1841   BILIRUBINUR NEGATIVE 03/28/2016 [place] 03/28/2016 1841   PROTEINUR 30 (A) 03/28/2016 1841   NITRITE NEGATIVE 03/28/2016 1841   LEUKOCYTESUR LARGE (A) 03/28/2016 1841    Radiological Exams on Admission: Dg Foot 2 Views Right  Result Date: 04/26/2016 CLINICAL DATA:  66 year old female with a history of redness swelling and pain no injury. EXAM: RIGHT FOOT - 2 VIEW COMPARISON:  None. FINDINGS: Pronounced swelling of the right foot and ankle, most evident on the lateral view. No radiopaque foreign body. No displaced fracture. Mild degenerative changes of the forefoot, midfoot, hindfoot. IMPRESSION: Nonspecific soft tissue swelling of the right foot and ankle. No acute fracture identified. Electronically Signed   By: Corrie Mckusick D.O.   On: 04/26/2016 12:49    EKG: Independently reviewed. Atrial fibrillation without acute changes  Assessment/Plan Active Problems:   Atrial fibrillation (HCC)   Pancreatic cancer (HCC)   Essential hypertension   Cellulitis   COPD (chronic obstructive pulmonary disease) (HCC)   Chronic respiratory failure with hypoxia (HCC)    1. Cellulitis of right foot. Start the patient on intravenous antibiotics, vancomycin and meropenem per cellulitis order set. Keep right foot elevated. She recently had venous Dopplers done which did not show any evidence of DVT.  2. Chronic respiratory failure with COPD . respiratory status. Currently appears to be at baseline. Continue bronchodilators and pulmonary hygiene.  3. Atrial fibrillation continue on metoprolol and diltiazem for rate control. Continue anticoagulation with Xarelto .  4. Hypertension. Continue antihypertensive medications. Blood pressures are currently stable.  5. Pancreatic cancer. Follow-up with oncology for further management.  DVT prophylaxis:  Xarelto Code Status: full code Family Communication: no family present Disposition Plan: discharge home once improved Consults called:  Admission status: inpatient, Dollene [place] MD [facility] Pager [phone]  If 7PM-7AM, please contact night-coverage [email or web address] Password [facility]  04/26/2016, 5:36 PM

## 2016-04-26 NOTE — Progress Notes (Signed)
Pharmacy Antibiotic Note  Lindsay Salazar is a 66 y.o. female admitted on 04/26/2016 with sepsis. / cellulitis  Pharmacy has been consulted for VANCOMYCIN and MEROPENEM.  AZTREONAM AND FLAGYL d/c'd.  Plan:  Vancomycin 1000mg  IV q24h Check trough at steady state Meropenem 1gm IV q8h Monitor labs, renal fxn, progress and c/s Deescalate ABX when improved / appropriate.    Height: 5\' 7"  (170.2 cm) Weight: 128 lb (58.1 kg) IBW/kg (Calculated) : 61.6  Temp (24hrs), Avg:98.6 F (37 C), Min:98 F (36.7 C), Max:99.1 F (37.3 C)   Recent Labs Lab 04/24/16 0958 04/26/16 1234 04/26/16 1243 04/26/16 1305  WBC 7.9 5.3  --   --   CREATININE 1.27* 1.10*  --   --   LATICACIDVEN  --   --  1.5 2.94*    Estimated Creatinine Clearance: 45.5 mL/min (A) (by C-G formula based on SCr of 1.1 mg/dL (H)).    Allergies  Allergen Reactions  . Ibuprofen Nausea And Vomiting  . Other     Seafood   . Penicillins Swelling    Has patient had a PCN reaction causing immediate rash, facial/tongue/throat swelling, SOB or lightheadedness with hypotension: Yes Has patient had a PCN reaction causing severe rash involving mucus membranes or skin necrosis: No Has patient had a PCN reaction that required hospitalization No Has patient had a PCN reaction occurring within the last 10 years: No If all of the above answers are "NO", then may proceed with Cephalosporin use.   . Tiotropium Bromide Monohydrate Itching  . Tramadol Nausea And Vomiting   Antimicrobials this admission: Vanc 3/24 >>  Flagyl 3/24 >> 3/24 Aztreonam 3/24 >>3/24 Meropenem 3/24 >>  Dose adjustments this admission:  Microbiology results:  BCx: pending  UCx: pending   Sputum:    MRSA PCR:   Thank you for allowing pharmacy to be a part of this patient's care.  [name] A 04/26/2016 5:53 PM

## 2016-04-26 NOTE — ED Notes (Addendum)
Floor unable to take report at this time. Soup given

## 2016-04-26 NOTE — ED Triage Notes (Signed)
Pt with redness and swelling to right foot for a week.

## 2016-04-27 ENCOUNTER — Encounter: Payer: Self-pay | Admitting: Physician Assistant

## 2016-04-27 LAB — CBC
HCT: 29.2 % — ABNORMAL LOW (ref 36.0–46.0)
Hemoglobin: 9.3 g/dL — ABNORMAL LOW (ref 12.0–15.0)
MCH: 30.9 pg (ref 26.0–34.0)
MCHC: 31.8 g/dL (ref 30.0–36.0)
MCV: 97 fL (ref 78.0–100.0)
Platelets: 153 10*3/uL (ref 150–400)
RBC: 3.01 MIL/uL — ABNORMAL LOW (ref 3.87–5.11)
RDW: 19 % — ABNORMAL HIGH (ref 11.5–15.5)
WBC: 5.2 10*3/uL (ref 4.0–10.5)

## 2016-04-27 LAB — BASIC METABOLIC PANEL
Anion gap: 7 (ref 5–15)
BUN: 20 mg/dL (ref 6–20)
CHLORIDE: 107 mmol/L (ref 101–111)
CO2: 23 mmol/L (ref 22–32)
CREATININE: 1.29 mg/dL — AB (ref 0.44–1.00)
Calcium: 7.8 mg/dL — ABNORMAL LOW (ref 8.9–10.3)
GFR calc non Af Amer: 42 mL/min — ABNORMAL LOW (ref >60)
GFR, EST AFRICAN AMERICAN: 49 mL/min — AB (ref >60)
Glucose, Bld: 116 mg/dL — ABNORMAL HIGH (ref 65–99)
Potassium: 3.9 mmol/L (ref 3.5–5.1)
Sodium: 137 mmol/L (ref 135–145)

## 2016-04-27 MED ORDER — SODIUM CHLORIDE 0.9 % IV SOLN
INTRAVENOUS | Status: AC
Start: 2016-04-27 — End: 2016-04-27
  Filled 2016-04-27: qty 1

## 2016-04-27 MED ORDER — RIVAROXABAN 15 MG PO TABS
15.0000 mg | ORAL_TABLET | Freq: Every day | ORAL | Status: DC
  Administered 2016-04-27 – 2016-05-01 (×5): 15 mg via ORAL
  Filled 2016-04-27 (×5): qty 1

## 2016-04-27 NOTE — Progress Notes (Signed)
PROGRESS NOTE    Lindsay Salazar  WLN:[id number] DOB: [birth year] DOA: 04/26/2016 PCP: [name], PA-C    Brief Narrative:  66 y/o female with history of oxygen dependent COPD, a fib, presents with right leg cellulitis. She was admitted for IV antibiotic therapy   Assessment & Plan:   Active Problems:   Atrial fibrillation (HCC)   Pancreatic cancer (HCC)   Essential hypertension   Cellulitis   COPD (chronic obstructive pulmonary disease) (HCC)   Chronic respiratory failure with hypoxia ([place])   1. Cellulitis of right foot. currently on intravenous antibiotics, vancomycin and meropenem per cellulitis order set. Keep right foot elevated. Erythema appears to be mildly improving today. She recently had venous Dopplers done which did not show any evidence of DVT.  2. Chronic respiratory failure with COPD. Currently appears to be at baseline. Continue bronchodilators and pulmonary hygiene.  3. Atrial fibrillation. continue on metoprolol and diltiazem for rate control. Continue anticoagulation with Xarelto .  4. Hypertension. Continue antihypertensive medications. Blood pressures are currently stable.  5. Pancreatic cancer. Follow-up with oncology for further management.   DVT prophylaxis: xarelto Code Status: full code Family Communication: no family present Disposition Plan: discharge home once improved.   Consultants:     Procedures:     Antimicrobials:   Vancomycin 3/24>>  Meropenem 3/24>>   Subjective: Right foot is less painful and less swollen  Objective: Vitals:   04/26/16 2102 04/27/16 0117 04/27/16 0510 04/27/16 0750  BP: 102/71  (!) 97/49   Pulse: 67  77   Resp: 18  18   Temp: 98.1 F (36.7 C)  98.3 F (36.8 C)   TempSrc: Oral  Oral   SpO2: 98% 95% 99% 94%  Weight:      Height:        Intake/Output Summary (Last 24 hours) at 04/27/16 1225 Last data filed at 04/27/16 0900  Gross per 24 hour  Intake             3250 ml  Output               500 ml  Net             2750 ml   Filed Weights   04/26/16 1128 04/26/16 1142  Weight: 58.1 kg (128 lb) 58.1 kg (128 lb)    Examination:  General exam: Appears calm and comfortable  Respiratory system: diminished breath sounds bilaterally. Respiratory effort normal. Cardiovascular system: S1 & S2 heard, irregular. No JVD, murmurs, rubs, gallops or clicks. No pedal edema. Gastrointestinal system: Abdomen is nondistended, soft and nontender. No organomegaly or masses felt. Normal bowel sounds heard. Central nervous system: Alert and oriented. No focal neurological deficits. Extremities: Symmetric 5 x 5 power. Skin: erythema over right lower extremity. Edema and warmth improving. Pulses palpable in RLE Psychiatry: Judgement and insight appear normal. Mood & affect appropriate.     Data Reviewed: I have personally reviewed following labs and imaging studies  CBC:  Recent Labs Lab 04/24/16 0958 04/26/16 1234 04/27/16 0654  WBC 7.9 5.3 5.2  NEUTROABS 6.0 2.9  --   HGB 9.7* 11.2* 9.3*  HCT 30.5* 35.5* 29.2*  MCV 98.1 97.0 97.0  PLT 82* 136* 740   Basic Metabolic Panel:  Recent Labs Lab 04/24/16 0958 04/26/16 1234 04/27/16 0654  NA 134* 137 137  K 3.6 3.2* 3.9  CL 100* 102 107  CO2 22 24 23   GLUCOSE 123* 98 116*  BUN 24* 19 20  CREATININE 1.27* 1.10* 1.29*  CALCIUM 8.4* 8.5* 7.8*   GFR: Estimated Creatinine Clearance: 38.8 mL/min (A) (by C-G formula based on SCr of 1.29 mg/dL (H)). Liver Function Tests:  Recent Labs Lab 04/24/16 0958 04/26/16 1234  AST 47* 38  ALT 59* 48  ALKPHOS 102 106  BILITOT 0.5 0.7  PROT 6.5 6.7  ALBUMIN 2.8* 2.9*   No results for input(s): LIPASE, AMYLASE in the last 168 hours. No results for input(s): AMMONIA in the last 168 hours. Coagulation Profile: No results for input(s): INR, PROTIME in the last 168 hours. Cardiac Enzymes: No results for input(s): CKTOTAL, CKMB, CKMBINDEX, TROPONINI in the last 168 hours. BNP (last 3  results) No results for input(s): PROBNP in the last 8760 hours. HbA1C: No results for input(s): HGBA1C in the last 72 hours. CBG: No results for input(s): GLUCAP in the last 168 hours. Lipid Profile: No results for input(s): CHOL, HDL, LDLCALC, TRIG, CHOLHDL, LDLDIRECT in the last 72 hours. Thyroid Function Tests: No results for input(s): TSH, T4TOTAL, FREET4, T3FREE, THYROIDAB in the last 72 hours. Anemia Panel: No results for input(s): VITAMINB12, FOLATE, FERRITIN, TIBC, IRON, RETICCTPCT in the last 72 hours. Sepsis Labs:  Recent Labs Lab 04/26/16 1243 04/26/16 1305  LATICACIDVEN 1.5 2.94*    Recent Results (from the past 240 hour(s))  Blood Culture (routine x 2)     Status: None (Preliminary result)   Collection Time: 04/26/16 12:36 PM  Result Value Ref Range Status   Specimen Description LEFT ANTECUBITAL  Final   Special Requests BOTTLES DRAWN AEROBIC AND ANAEROBIC 6CC EACH  Final   Culture NO GROWTH < 24 HOURS  Final   Report Status PENDING  Incomplete  Blood Culture (routine x 2)     Status: None (Preliminary result)   Collection Time: 04/26/16 12:43 PM  Result Value Ref Range Status   Specimen Description RIGHT ANTECUBITAL  Final   Special Requests BOTTLES DRAWN AEROBIC AND ANAEROBIC 6CC EACH  Final   Culture NO GROWTH < 24 HOURS  Final   Report Status PENDING  Incomplete         Radiology Studies: Dg Foot 2 Views Right  Result Date: 04/26/2016 CLINICAL DATA:  66 year old female with a history of redness swelling and pain no injury. EXAM: RIGHT FOOT - 2 VIEW COMPARISON:  None. FINDINGS: Pronounced swelling of the right foot and ankle, most evident on the lateral view. No radiopaque foreign body. No displaced fracture. Mild degenerative changes of the forefoot, midfoot, hindfoot. IMPRESSION: Nonspecific soft tissue swelling of the right foot and ankle. No acute fracture identified. Electronically Signed   By: Corrie Mckusick D.O.   On: 04/26/2016 12:49         Scheduled Meds: . diltiazem  360 mg Oral Daily  . guaiFENesin  1,200 mg Oral BID  . ipratropium-albuterol  3 mL Nebulization Q6H  . mouth rinse  15 mL Mouth Rinse BID  . meropenem (MERREM) IV  1 g Intravenous Q8H  . metoprolol  50 mg Oral BID  . mometasone-formoterol  2 puff Inhalation BID  . pantoprazole  40 mg Oral Daily  . potassium chloride  60 mEq Oral Daily  . rivaroxaban  15 mg Oral Q supper  . sodium chloride flush  3 mL Intravenous Q12H   Continuous Infusions:   LOS: 1 day    Time spent: 24mins    Desiree Daise, MD [facility] Pager [phone]  If 7PM-7AM, please contact night-coverage [email or web address] Password [facility] 04/27/2016, 12:25 PM

## 2016-04-28 LAB — CBC
HEMATOCRIT: 30.4 % — AB (ref 36.0–46.0)
HEMOGLOBIN: 9.6 g/dL — AB (ref 12.0–15.0)
MCH: 30.9 pg (ref 26.0–34.0)
MCHC: 31.6 g/dL (ref 30.0–36.0)
MCV: 97.7 fL (ref 78.0–100.0)
Platelets: 179 10*3/uL (ref 150–400)
RBC: 3.11 MIL/uL — ABNORMAL LOW (ref 3.87–5.11)
RDW: 19.1 % — AB (ref 11.5–15.5)
WBC: 5.2 10*3/uL (ref 4.0–10.5)

## 2016-04-28 LAB — BASIC METABOLIC PANEL
ANION GAP: 5 (ref 5–15)
BUN: 17 mg/dL (ref 6–20)
CHLORIDE: 106 mmol/L (ref 101–111)
CO2: 25 mmol/L (ref 22–32)
Calcium: 8 mg/dL — ABNORMAL LOW (ref 8.9–10.3)
Creatinine, Ser: 1.08 mg/dL — ABNORMAL HIGH (ref 0.44–1.00)
GFR calc Af Amer: >60 mL/min (ref >60)
GFR calc non Af Amer: 52 mL/min — ABNORMAL LOW (ref >60)
GLUCOSE: 109 mg/dL — AB (ref 65–99)
POTASSIUM: 3.9 mmol/L (ref 3.5–5.1)
Sodium: 136 mmol/L (ref 135–145)

## 2016-04-28 MED ORDER — ALBUTEROL SULFATE (2.5 MG/3ML) 0.083% IN NEBU
INHALATION_SOLUTION | RESPIRATORY_TRACT | Status: AC
  Administered 2016-04-28: 2.5 mg
  Filled 2016-04-28: qty 3

## 2016-04-28 MED ORDER — ALBUTEROL SULFATE (2.5 MG/3ML) 0.083% IN NEBU
2.5000 mg | INHALATION_SOLUTION | RESPIRATORY_TRACT | Status: DC | PRN
  Administered 2016-04-28 – 2016-05-01 (×9): 2.5 mg via RESPIRATORY_TRACT
  Filled 2016-04-28 (×9): qty 3

## 2016-04-28 NOTE — Progress Notes (Addendum)
PROGRESS NOTE    Lindsay Salazar  HWE:[id number] DOB: 1950-02-14 DOA: 04/26/2016 PCP: [name], PA-C    Brief Narrative:  66 y/o female with history of oxygen dependent COPD, a fib, presents with right leg cellulitis. She was admitted for IV antibiotic therapy   Assessment & Plan:   Active Problems:   Atrial fibrillation (HCC)   Pancreatic cancer (HCC)   Essential hypertension   Cellulitis   COPD (chronic obstructive pulmonary disease) (HCC)   Chronic respiratory failure with hypoxia ([place])   1. Cellulitis of right foot. currently on intravenous antibiotics, vancomycin and meropenem per cellulitis order set. Keep right foot elevated. Erythema appears to be mildly improving today. She recently had venous Dopplers done which did not show any evidence of DVT. Anticipate changing to oral antibiotics tomorrow  2. Chronic respiratory failure with COPD. Currently appears to be at baseline. Continue bronchodilators and pulmonary hygiene.  3. Chronic Atrial fibrillation. continue on metoprolol and diltiazem for rate control. Continue anticoagulation with Xarelto .  4. Hypertension. Continue antihypertensive medications. Blood pressures are currently stable.  5. Pancreatic cancer. Follow-up with oncology for further management.   DVT prophylaxis: xarelto Code Status: full code Family Communication: no family present Disposition Plan: discharge home once improved.   Consultants:     Procedures:     Antimicrobials:   Vancomycin 3/24>>  Meropenem 3/24>>   Subjective: Had some nausea this morning, no vomiting. Breathing is at baseline. Right foot feels better.  Objective: Vitals:   04/28/16 0050 04/28/16 0602 04/28/16 0608 04/28/16 1323  BP:  (!) 125/52    Pulse: 64 77    Resp: 20 18    Temp:  98.2 F (36.8 C)    TempSrc:  Oral    SpO2: 94% 98% 98% 97%  Weight:      Height:        Intake/Output Summary (Last 24 hours) at 04/28/16 1750 Last data filed at  04/27/16 2246  Gross per 24 hour  Intake                3 ml  Output                0 ml  Net                3 ml   Filed Weights   04/26/16 1128 04/26/16 1142  Weight: 58.1 kg (128 lb) 58.1 kg (128 lb)    Examination:  General exam: Appears calm and comfortable  Respiratory system: diminished breath sounds bilaterally. Respiratory effort normal. Cardiovascular system: S1 & S2 heard, irregular. No JVD, murmurs, rubs, gallops or clicks. No pedal edema. Gastrointestinal system: Abdomen is nondistended, soft and nontender. No organomegaly or masses felt. Normal bowel sounds heard. Central nervous system: Alert and oriented. No focal neurological deficits. Extremities: Symmetric 5 x 5 power. Skin: erythema over right lower extremity, improving. Edema and warmth improving. Pulses palpable in RLE Psychiatry: Judgement and insight appear normal. Mood & affect appropriate.     Data Reviewed: I have personally reviewed following labs and imaging studies  CBC:  Recent Labs Lab 04/24/16 0958 04/26/16 1234 04/27/16 0654 04/28/16 0548  WBC 7.9 5.3 5.2 5.2  NEUTROABS 6.0 2.9  --   --   HGB 9.7* 11.2* 9.3* 9.6*  HCT 30.5* 35.5* 29.2* 30.4*  MCV 98.1 97.0 97.0 97.7  PLT 82* 136* 153 893   Basic Metabolic Panel:  Recent Labs Lab 04/24/16 0958 04/26/16 1234 04/27/16 0654 04/28/16  0548  NA 134* 137 137 136  K 3.6 3.2* 3.9 3.9  CL 100* 102 107 106  CO2 22 24 23 25   GLUCOSE 123* 98 116* 109*  BUN 24* 19 20 17   CREATININE 1.27* 1.10* 1.29* 1.08*  CALCIUM 8.4* 8.5* 7.8* 8.0*   GFR: Estimated Creatinine Clearance: 46.4 mL/min (A) (by C-G formula based on SCr of 1.08 mg/dL (H)). Liver Function Tests:  Recent Labs Lab 04/24/16 0958 04/26/16 1234  AST 47* 38  ALT 59* 48  ALKPHOS 102 106  BILITOT 0.5 0.7  PROT 6.5 6.7  ALBUMIN 2.8* 2.9*   No results for input(s): LIPASE, AMYLASE in the last 168 hours. No results for input(s): AMMONIA in the last 168 hours. Coagulation  Profile: No results for input(s): INR, PROTIME in the last 168 hours. Cardiac Enzymes: No results for input(s): CKTOTAL, CKMB, CKMBINDEX, TROPONINI in the last 168 hours. BNP (last 3 results) No results for input(s): PROBNP in the last 8760 hours. HbA1C: No results for input(s): HGBA1C in the last 72 hours. CBG: No results for input(s): GLUCAP in the last 168 hours. Lipid Profile: No results for input(s): CHOL, HDL, LDLCALC, TRIG, CHOLHDL, LDLDIRECT in the last 72 hours. Thyroid Function Tests: No results for input(s): TSH, T4TOTAL, FREET4, T3FREE, THYROIDAB in the last 72 hours. Anemia Panel: No results for input(s): VITAMINB12, FOLATE, FERRITIN, TIBC, IRON, RETICCTPCT in the last 72 hours. Sepsis Labs:  Recent Labs Lab 04/26/16 1243 04/26/16 1305  LATICACIDVEN 1.5 2.94*    Recent Results (from the past 240 hour(s))  Blood Culture (routine x 2)     Status: None (Preliminary result)   Collection Time: 04/26/16 12:36 PM  Result Value Ref Range Status   Specimen Description LEFT ANTECUBITAL  Final   Special Requests BOTTLES DRAWN AEROBIC AND ANAEROBIC 6CC EACH  Final   Culture NO GROWTH 2 DAYS  Final   Report Status PENDING  Incomplete  Blood Culture (routine x 2)     Status: None (Preliminary result)   Collection Time: 04/26/16 12:43 PM  Result Value Ref Range Status   Specimen Description RIGHT ANTECUBITAL  Final   Special Requests BOTTLES DRAWN AEROBIC AND ANAEROBIC 6CC EACH  Final   Culture NO GROWTH 2 DAYS  Final   Report Status PENDING  Incomplete         Radiology Studies: No results found.      Scheduled Meds: . diltiazem  360 mg Oral Daily  . guaiFENesin  1,200 mg Oral BID  . ipratropium-albuterol  3 mL Nebulization Q6H  . mouth rinse  15 mL Mouth Rinse BID  . meropenem (MERREM) IV  1 g Intravenous Q8H  . metoprolol  50 mg Oral BID  . mometasone-formoterol  2 puff Inhalation BID  . pantoprazole  40 mg Oral Daily  . potassium chloride  60 mEq Oral  Daily  . rivaroxaban  15 mg Oral Q supper  . sodium chloride flush  3 mL Intravenous Q12H   Continuous Infusions:   LOS: 2 days    Time spent: 61mins    MEMON,JEHANZEB, MD [facility] Pager [phone]  If 7PM-7AM, please contact night-coverage [email or web address] Password [email or web address] 04/28/2016, 5:50 PM

## 2016-04-29 ENCOUNTER — Ambulatory Visit: Payer: Medicare HMO | Admitting: Physician Assistant

## 2016-04-29 MED ORDER — FUROSEMIDE 10 MG/ML IJ SOLN
40.0000 mg | Freq: Once | INTRAMUSCULAR | Status: AC
  Administered 2016-04-29: 40 mg via INTRAVENOUS
  Filled 2016-04-29: qty 4

## 2016-04-29 NOTE — Progress Notes (Signed)
PROGRESS NOTE    Lindsay Salazar  NWG:[id number] DOB: 1950/02/21 DOA: 04/26/2016 PCP: [name], PA-C    Brief Narrative:  66 y/o female with history of oxygen dependent COPD, a fib, presents with right leg cellulitis. She was admitted for IV antibiotic therapy   Assessment & Plan:   Active Problems:   Atrial fibrillation (HCC)   Pancreatic cancer (HCC)   Essential hypertension   Cellulitis   COPD (chronic obstructive pulmonary disease) (HCC)   Chronic respiratory failure with hypoxia ([place])   1. Cellulitis of right foot. currently on intravenous antibiotics, vancomycin and meropenem per cellulitis order set. Keep right foot elevated. Erythema appears to be mildly improving, although swelling and warmth mildly worse today. She recently had venous Dopplers done which did not show any evidence of DVT. Will give one dose of lasix today, since she has bilateral lower extremity edema.  Anticipate changing to oral antibiotics tomorrow  2. Chronic respiratory failure with COPD. Currently appears to be at baseline. Continue bronchodilators and pulmonary hygiene.  3. Chronic Atrial fibrillation. continue on metoprolol and diltiazem for rate control. Continue anticoagulation with Xarelto .  4. Hypertension. Continue antihypertensive medications. Blood pressures are currently stable.  5. Pancreatic cancer. Follow-up with oncology for further management.   DVT prophylaxis: xarelto Code Status: full code Family Communication: no family present Disposition Plan: discharge home once improved.   Consultants:     Procedures:     Antimicrobials:   Vancomycin 3/24>>  Meropenem 3/24>>   Subjective: Has increasing pain in right leg. Breathing is at baseline  Objective: Vitals:   04/29/16 0744 04/29/16 1127 04/29/16 1338 04/29/16 1515  BP:    110/71  Pulse:    (!) 122  Resp:      Temp:    97.9 F (36.6 C)  TempSrc:    Oral  SpO2: 98% 98% 96% 96%  Weight:        Height:        Intake/Output Summary (Last 24 hours) at 04/29/16 1548 Last data filed at 04/29/16 1204  Gross per 24 hour  Intake              500 ml  Output              700 ml  Net             -200 ml   Filed Weights   04/26/16 1128 04/26/16 1142  Weight: 58.1 kg (128 lb) 58.1 kg (128 lb)    Examination:  General exam: Appears calm and comfortable  Respiratory system: diminished breath sounds bilaterally. Respiratory effort normal. Cardiovascular system: S1 & S2 heard, irregular. No JVD, murmurs, rubs, gallops or clicks. No pedal edema. Gastrointestinal system: Abdomen is nondistended, soft and nontender. No organomegaly or masses felt. Normal bowel sounds heard. Central nervous system: Alert and oriented. No focal neurological deficits. Extremities: Symmetric 5 x 5 power. Skin: erythema over right lower extremity, improving. Edema and warmth slightly worse today. Pulses palpable in RLE Psychiatry: Judgement and insight appear normal. Mood & affect appropriate.     Data Reviewed: I have personally reviewed following labs and imaging studies  CBC:  Recent Labs Lab 04/24/16 0958 04/26/16 1234 04/27/16 0654 04/28/16 0548  WBC 7.9 5.3 5.2 5.2  NEUTROABS 6.0 2.9  --   --   HGB 9.7* 11.2* 9.3* 9.6*  HCT 30.5* 35.5* 29.2* 30.4*  MCV 98.1 97.0 97.0 97.7  PLT 82* 136* 153 578   Basic Metabolic  Panel:  Recent Labs Lab 04/24/16 0958 04/26/16 1234 04/27/16 0654 04/28/16 0548  NA 134* 137 137 136  K 3.6 3.2* 3.9 3.9  CL 100* 102 107 106  CO2 22 24 23 25   GLUCOSE 123* 98 116* 109*  BUN 24* 19 20 17   CREATININE 1.27* 1.10* 1.29* 1.08*  CALCIUM 8.4* 8.5* 7.8* 8.0*   GFR: Estimated Creatinine Clearance: 46.4 mL/min (A) (by C-G formula based on SCr of 1.08 mg/dL (H)). Liver Function Tests:  Recent Labs Lab 04/24/16 0958 04/26/16 1234  AST 47* 38  ALT 59* 48  ALKPHOS 102 106  BILITOT 0.5 0.7  PROT 6.5 6.7  ALBUMIN 2.8* 2.9*   No results for input(s):  LIPASE, AMYLASE in the last 168 hours. No results for input(s): AMMONIA in the last 168 hours. Coagulation Profile: No results for input(s): INR, PROTIME in the last 168 hours. Cardiac Enzymes: No results for input(s): CKTOTAL, CKMB, CKMBINDEX, TROPONINI in the last 168 hours. BNP (last 3 results) No results for input(s): PROBNP in the last 8760 hours. HbA1C: No results for input(s): HGBA1C in the last 72 hours. CBG: No results for input(s): GLUCAP in the last 168 hours. Lipid Profile: No results for input(s): CHOL, HDL, LDLCALC, TRIG, CHOLHDL, LDLDIRECT in the last 72 hours. Thyroid Function Tests: No results for input(s): TSH, T4TOTAL, FREET4, T3FREE, THYROIDAB in the last 72 hours. Anemia Panel: No results for input(s): VITAMINB12, FOLATE, FERRITIN, TIBC, IRON, RETICCTPCT in the last 72 hours. Sepsis Labs:  Recent Labs Lab 04/26/16 1243 04/26/16 1305  LATICACIDVEN 1.5 2.94*    Recent Results (from the past 240 hour(s))  Blood Culture (routine x 2)     Status: None (Preliminary result)   Collection Time: 04/26/16 12:36 PM  Result Value Ref Range Status   Specimen Description LEFT ANTECUBITAL  Final   Special Requests BOTTLES DRAWN AEROBIC AND ANAEROBIC 6CC EACH  Final   Culture NO GROWTH 3 DAYS  Final   Report Status PENDING  Incomplete  Blood Culture (routine x 2)     Status: None (Preliminary result)   Collection Time: 04/26/16 12:43 PM  Result Value Ref Range Status   Specimen Description RIGHT ANTECUBITAL  Final   Special Requests BOTTLES DRAWN AEROBIC AND ANAEROBIC [place]  Final   Culture NO GROWTH 3 DAYS  Final   Report Status PENDING  Incomplete         Radiology Studies: No results found.      Scheduled Meds: . diltiazem  360 mg Oral Daily  . furosemide  40 mg Intravenous Once  . guaiFENesin  1,200 mg Oral BID  . ipratropium-albuterol  3 mL Nebulization Q6H  . mouth rinse  15 mL Mouth Rinse BID  . meropenem (MERREM) IV  1 g Intravenous Q8H  .  metoprolol  50 mg Oral BID  . mometasone-formoterol  2 puff Inhalation BID  . pantoprazole  40 mg Oral Daily  . potassium chloride  60 mEq Oral Daily  . rivaroxaban  15 mg Oral Q supper  . sodium chloride flush  3 mL Intravenous Q12H   Continuous Infusions:   LOS: 3 days    Time spent: 51mins    MEMON,JEHANZEB, MD [facility] Pager [phone]  If 7PM-7AM, please contact night-coverage [email or web address] Password [facility] 04/29/2016, 3:48 PM

## 2016-04-29 NOTE — Care Management Note (Signed)
Case Management Note  Patient Details  Name: Lindsay Salazar MRN: [id number] Date of Birth: 12/06/1950  Subjective/Objective:  Adm with cellulitis. From home, ind with ADL's. Has supplemental oxygen and RW PTA. Patient still declines any home health service. CM spoke with patient about [facility] care program. Patient declines any service that involves someone coming to her house. She states she "is a very private person" and that she has a pit bull In the house.                Action/Plan: Anticipate DC home with self care.    Expected Discharge Date:     04/30/2016          Expected Discharge Plan:  Home/Self Care  In-House Referral:     Discharge planning Services  CM Consult  Post Acute Care Choice:    Choice offered to:  NA  DME Arranged:    DME Agency:     HH Arranged:    HH Agency:     Status of Service:  Completed, signed off  If discussed at [employer] of Stay Meetings, dates discussed:    Additional Comments:  Djibril Glogowski, Chauncey [place], RN 04/29/2016, 3:02 PM

## 2016-04-30 ENCOUNTER — Inpatient Hospital Stay (HOSPITAL_COMMUNITY): Payer: Medicare HMO

## 2016-04-30 DIAGNOSIS — I4891 Unspecified atrial fibrillation: Secondary | ICD-10-CM

## 2016-04-30 DIAGNOSIS — C251 Malignant neoplasm of body of pancreas: Secondary | ICD-10-CM

## 2016-04-30 LAB — CBC
HCT: 32.1 % — ABNORMAL LOW (ref 36.0–46.0)
HEMOGLOBIN: 10 g/dL — AB (ref 12.0–15.0)
MCH: 31.3 pg (ref 26.0–34.0)
MCHC: 31.2 g/dL (ref 30.0–36.0)
MCV: 100.3 fL — AB (ref 78.0–100.0)
PLATELETS: 266 10*3/uL (ref 150–400)
RBC: 3.2 MIL/uL — AB (ref 3.87–5.11)
RDW: 19.3 % — ABNORMAL HIGH (ref 11.5–15.5)
WBC: 7.1 10*3/uL (ref 4.0–10.5)

## 2016-04-30 LAB — BASIC METABOLIC PANEL
Anion gap: 6 (ref 5–15)
BUN: 12 mg/dL (ref 6–20)
CHLORIDE: 103 mmol/L (ref 101–111)
CO2: 28 mmol/L (ref 22–32)
Calcium: 8.3 mg/dL — ABNORMAL LOW (ref 8.9–10.3)
Creatinine, Ser: 1.05 mg/dL — ABNORMAL HIGH (ref 0.44–1.00)
GFR calc non Af Amer: 54 mL/min — ABNORMAL LOW (ref >60)
Glucose, Bld: 98 mg/dL (ref 65–99)
Potassium: 4.7 mmol/L (ref 3.5–5.1)
Sodium: 137 mmol/L (ref 135–145)

## 2016-04-30 MED ORDER — GABAPENTIN 100 MG PO CAPS
100.0000 mg | ORAL_CAPSULE | Freq: Three times a day (TID) | ORAL | Status: DC
  Administered 2016-04-30 – 2016-05-01 (×5): 100 mg via ORAL
  Filled 2016-04-30 (×4): qty 1

## 2016-04-30 MED ORDER — DOXYCYCLINE HYCLATE 100 MG PO TABS
100.0000 mg | ORAL_TABLET | Freq: Two times a day (BID) | ORAL | Status: DC
  Administered 2016-04-30 – 2016-05-01 (×3): 100 mg via ORAL
  Filled 2016-04-30 (×2): qty 1

## 2016-04-30 MED ORDER — CEFUROXIME AXETIL 250 MG PO TABS
500.0000 mg | ORAL_TABLET | Freq: Two times a day (BID) | ORAL | Status: DC
  Administered 2016-04-30 – 2016-05-01 (×3): 500 mg via ORAL
  Filled 2016-04-30 (×3): qty 2

## 2016-04-30 NOTE — Evaluation (Signed)
Physical Therapy Evaluation Patient Details Name: Lindsay Salazar MRN: [id number] DOB: [birth year] Today's Date: 04/30/2016   History of Present Illness  Lindsay Salazar is a 66 y.o. female with medical history significant of chronic respiratory failure on oxygen, COPD, atrial fibrillation on anticoagulation, presents to the hospital with worsening swelling in her right lower extremity. Patient reports over the past 3 weeks she's had swelling, redness and pain in her right lower tremor. She had gone her primary care physician 2 days ago and received a prescription for clindamycin. She reports that initially her cellulitis appeared to be improving, but this morning it had particularly gotten worse. She has worsening erythema, pain. She has noticed some drainage from multiple areas in her leg/foot. She does not felt that she had any fevers. No other new complaints.Lindsay Salazar is a 66 y.o. female with medical history significant of chronic respiratory failure on oxygen, COPD, atrial fibrillation on anticoagulation, presents to the hospital with worsening swelling in her right lower extremity. Patient reports over the past 3 weeks she's had swelling, redness and pain in her right lower tremor. She had gone her primary care physician 2 days ago and received a prescription for clindamycin. She reports that initially her cellulitis appeared to be improving, but this morning it had particularly gotten worse. She has worsening erythema, pain. She has noticed some drainage from multiple areas in her leg/foot. She does not felt that she had any fevers. No other new complaints.  Clinical Impression  Pt received lying in bed and was agreeable to PT evaluation. Pt from home with her boyfriend and his brother where she has 24/7 help available. She has a RW at home but is not currently using it. Presently, pt is independent with bed mobility, modified independent with transfers with RW, and modified independent with ambulation in  the room with RW, only requiring assistance for IV pole and O2 navigation. Pt did require 1 vc to maintain feet within BOS of RW but otherwise she was steady with it. Prior to ambulation to the bathroom, sonography entered the room and asked to perform BP of pt's arms and legs. Pt wished to do that prior to using the restroom with PT. Once sonography was finished (approximately 20 mins later), PT assisted pt to the restroom where she had a BM. Pt ambulating slowly due to R leg pain throughout session. Pt was left sitting up in chair with respiratory therapy. Will continue to follow acutely to help promote endurance. Otherwise, pt does not need any follow-up PT as pt stated she felt like she was moving at her baseline level of function. PT educated pt to use her RW at home upon d/c to help with RLE pain and to regain her endurance; pt verbalized understanding.    Follow Up Recommendations No PT follow up    Equipment Recommendations  None recommended by PT    Recommendations for Other Services       Precautions / Restrictions Precautions Precautions: Fall Precaution Comments: due to IV pole and O2       Mobility  Bed Mobility Overal bed mobility: Independent                Transfers Overall transfer level: Modified independent Equipment used: Rolling walker (2 wheeled)                Ambulation/Gait Ambulation/Gait assistance: Modified independent (Device/Increase time);Supervision Ambulation Distance (Feet): 8 Feet Assistive device: Rolling walker (2 wheeled) Gait Pattern/deviations: Step-to pattern;Decreased  step length - right;Decreased step length - left;Decreased stance time - right;Antalgic     General Gait Details: required 1 vc to maintain feet within BOS of RW when amb to bathroom, but otherwise, pt mod I for ambulation - she required assistance for the IV pole and O2 cord negotiation only  Stairs            Wheelchair Mobility    Modified Rankin  (Stroke Patients Only)       Balance Overall balance assessment: Modified Independent (RW for standing)                                           Pertinent Vitals/Pain Pain Assessment: 0-10 Pain Score: 8  Pain Location: back of R leg Pain Descriptors / Indicators: Aching;Sharp Pain Intervention(s): Limited activity within patient's tolerance    Home Living Family/patient expects to be discharged to:: Private residence Living Arrangements: Spouse/significant other Available Help at Discharge: Available 24 hours/day;Family Type of Home: House Home Access: Stairs to enter Entrance Stairs-Rails: None Entrance Stairs-Number of Steps: 3 Home Layout: One level Home Equipment: Clinical cytogeneticist - 2 wheels      Prior Function           Comments: She states she got a RW after she started having problems but she was not using it. She is independent with dressing and bathing prior to admission.     Hand Dominance   Dominant Hand: Right    Extremity/Trunk Assessment   Upper Extremity Assessment Upper Extremity Assessment: Overall WFL for tasks assessed    Lower Extremity Assessment Lower Extremity Assessment: Generalized weakness       Communication   Communication: No difficulties  Cognition Arousal/Alertness: Awake/alert Behavior During Therapy: WFL for tasks assessed/performed Overall Cognitive Status: Within Functional Limits for tasks assessed                                        General Comments      Exercises     Assessment/Plan    PT Assessment Patient needs continued PT services  PT Problem List Decreased strength;Decreased activity tolerance;Decreased mobility;Pain       PT Treatment Interventions Gait training;DME instruction;Functional mobility training;Therapeutic exercise    PT Goals (Current goals can be found in the Care Plan section)  Acute Rehab PT Goals Patient Stated Goal: to go home PT Goal  Formulation: With patient Time For Goal Achievement: 05/03/16 Potential to Achieve Goals: Good    Frequency Min 3X/week   Barriers to discharge        Co-evaluation               End of Session Equipment Utilized During Treatment: Gait belt;Oxygen (2L [place]) Activity Tolerance: Patient tolerated treatment well Patient left: in chair;Other (comment) (with respiratory therapy) Nurse Communication: Mobility status PT Visit Diagnosis: Muscle weakness (generalized) (M62.81);Difficulty in walking, not elsewhere classified (R26.2)    Time: 0217-0305 PT Time Calculation (min) (ACUTE ONLY): 48 min   Charges:   PT Evaluation $PT Eval Low Complexity: 1 Procedure PT Treatments $Gait Training: 8-22 mins   PT G Codes:   PT G-Codes **NOT FOR INPATIENT CLASS** Functional Assessment Tool Used: AM-PAC 6 Clicks Basic Mobility;Clinical judgement Functional Limitation: Mobility: Walking and moving around Mobility: Walking and Moving Around  Current Status [phone]): At least 20 percent but less than 40 percent impaired, limited or restricted Mobility: Walking and Moving Around Goal Status [phone]): At least 1 percent but less than 20 percent impaired, limited or restricted    Geraldine Solar PT, DPT

## 2016-04-30 NOTE — Discharge Summary (Signed)
Physician Discharge Summary  Lindsay Salazar [facility] ZOX:[id number] DOB: May 06, 1950 DOA: 04/26/2016  PCP: [name], PA-C  Admit date: 04/26/2016 Discharge date: 05/01/16  Admitted From: Home Disposition:  Home   Recommendations for Outpatient Follow-up:  1. Follow up with PCP in 1-2 weeks 2. Please obtain BMP/CBC in one week   Home Health: No Equipment/Devices: none  Discharge Condition: Stable CODE STATUS: FULL Diet recommendation: Heart Healthy    Brief/Interim Summary: 66 year old female with a history of chronic respiratory failure on 2 L, COPD, chronic atrial fibrillation, and pancreatic cancer presented with 2-3 week history of worsening right lower extremity pain, edema, and erythema. The patient was started on vancomycin and meropenem with improvement of her erythema and pain. The patient was given furosemide 40 mg IV with improvement of her lower extremity edema. Unfortunately, the patient continues to smoke. Blood cultures have remained negative. The patient does have history of her dog frequently scratching her right lower extremity resulting in skin tears. Dog also licks her leg.  Discharge Diagnoses:  Cellulitis of right lower extremity -Discontinue meropenem-->home with which will cover pasteurella x 2 more days -overall edema and erythema have improved -Continue vancomycin-->home with doxy x 2 more days to complete 7 days of therapy -04/16/2016 RLE duplex neg for DVT -04/30/16 ABI--Left = 0.93; right 1.04 -While the patient certainly has a component of infectious process, I feel that the patient's erythema and pain may be multifactorial including venous stasis, petechiae,  or paraneoplastic syndrome  -check ABI--as above has pointed against PVD -started gabapentin  Chronic respiratory failure with hypoxia -Stable on 2 L -Continue bronchodilators  COPD -Continue Dulera -Stable on 2 L  Chronic atrial fibrillation -Heart rate controlled -Continue diltiazem  CD -Continue rivaroxaban--dose decreased to 15 mg due to CrCl and interaction with diltiazem  Hypertension -Controlled -Continue diltiazem and metoprolol tartrate  Pancreatic adenocarcinoma -S/P EUS by Dr. Ardis Hughs on 11/29/2015 with FNA demonstrating adenocarcinoma -re-evaluated for distal pancreatectomy by Dr. Barry Dienes on 03/19/2016. Dr. Barry Dienes has arranged for pulmonology and cardiology clearance and anticipates seeing patient back following this clearance.  Deconditioning -PT eval--no PT follow up   Discharge Instructions  Discharge Instructions    Diet - low sodium heart healthy    Complete by:  As directed    Increase activity slowly    Complete by:  As directed      Allergies as of 05/01/2016      Reactions   Ibuprofen Nausea And Vomiting   Other    Seafood   Penicillins Swelling   Has patient had a PCN reaction causing immediate rash, facial/tongue/throat swelling, SOB or lightheadedness with hypotension: Yes Has patient had a PCN reaction causing severe rash involving mucus membranes or skin necrosis: No Has patient had a PCN reaction that required hospitalization No Has patient had a PCN reaction occurring within the last 10 years: No If all of the above answers are "NO", then may proceed with Cephalosporin use.   Tiotropium Bromide Monohydrate Itching   Tramadol Nausea And Vomiting      Medication List    TAKE these medications   ABRAXANE IV Inject into the vein. Day 1, day 8, day 15, every 28 days   COMBIVENT RESPIMAT 20-100 MCG/ACT Aers respimat Generic drug:  Ipratropium-Albuterol Inhale 1 puff into the lungs 2 (two) times daily.   ipratropium-albuterol 0.5-2.5 (3) MG/3ML Soln Commonly known as:  DUONEB Inhale 3 mLs into the lungs every 6 (six) hours as needed (for shortness of breath).  diltiazem 360 MG 24 hr capsule Commonly known as:  CARDIZEM CD Take 1 capsule (360 mg total) by mouth daily.   doxycycline 100 MG tablet Commonly known as:   VIBRA-TABS Take 1 tablet (100 mg total) by mouth every 12 (twelve) hours.   gabapentin 100 MG capsule Commonly known as:  NEURONTIN Take 1 capsule (100 mg total) by mouth 3 (three) times daily.   GEMZAR IV Inject into the vein. Day 1, day 8, day 15, every 28 days   guaiFENesin 600 MG 12 hr tablet Commonly known as:  MUCINEX Take 2 tablets (1,200 mg total) by mouth 2 (two) times daily.   HYDROcodone-acetaminophen 10-325 MG tablet Commonly known as:  NORCO Take 1 tablet by mouth every 6 (six) hours as needed.   metoprolol 50 MG tablet Commonly known as:  LOPRESSOR Take 1 tablet (50 mg total) by mouth 2 (two) times daily.   ondansetron 8 MG tablet Commonly known as:  ZOFRAN Take 1 tablet by mouth daily as needed for nausea/vomiting.   OXYGEN Inhale 2 L into the lungs daily as needed (for breathing).   pantoprazole 40 MG tablet Commonly known as:  PROTONIX Take 1 tablet (40 mg total) by mouth daily.   potassium chloride 20 MEQ/15ML (10%) Soln Take 45 mLs (60 mEq total) by mouth daily.   prochlorperazine 10 MG tablet Commonly known as:  COMPAZINE Take 1 tablet by mouth daily as needed for nausea/vomiting.   Rivaroxaban 15 MG Tabs tablet Commonly known as:  XARELTO Take 1 tablet (15 mg total) by mouth daily with supper. What changed:  medication strength  how much to take   SYMBICORT 160-4.5 MCG/ACT inhaler Generic drug:  budesonide-formoterol Inhale 1 puff into the lungs 2 (two) times daily.            Durable Medical Equipment        Start     Ordered   05/01/16 0753  For home use only DME 3 n 1  Once     05/01/16 0752      Allergies  Allergen Reactions  . Ibuprofen Nausea And Vomiting  . Other     Seafood   . Penicillins Swelling    Has patient had a PCN reaction causing immediate rash, facial/tongue/throat swelling, SOB or lightheadedness with hypotension: Yes Has patient had a PCN reaction causing severe rash involving mucus membranes or skin  necrosis: No Has patient had a PCN reaction that required hospitalization No Has patient had a PCN reaction occurring within the last 10 years: No If all of the above answers are "NO", then may proceed with Cephalosporin use.   . Tiotropium Bromide Monohydrate Itching  . Tramadol Nausea And Vomiting    Consultations:  none   Procedures/Studies: US Venous Img Lower Unilateral Right  Result Date: 04/16/2016 CLINICAL DATA:  Right lower extremity edema. EXAM: RIGHT LOWER EXTREMITY VENOUS DOPPLER ULTRASOUND TECHNIQUE: Gray-scale sonography with graded compression, as well as color Doppler and duplex ultrasound were performed to evaluate the lower extremity deep venous systems from the level of the common femoral vein and including the common femoral, femoral, profunda femoral, popliteal and calf veins including the posterior tibial, peroneal and gastrocnemius veins when visible. The superficial great saphenous vein was also interrogated. Spectral Doppler was utilized to evaluate flow at rest and with distal augmentation maneuvers in the common femoral, femoral and popliteal veins. COMPARISON:  None. FINDINGS: Contralateral Common Femoral Vein: Respiratory phasicity is normal and symmetric with the symptomatic side. No  evidence of thrombus. Normal compressibility. Common Femoral Vein: No evidence of thrombus. Normal compressibility, respiratory phasicity and response to augmentation. Saphenofemoral Junction: No evidence of thrombus. Normal compressibility and flow on color Doppler imaging. Profunda Femoral Vein: No evidence of thrombus. Normal compressibility and flow on color Doppler imaging. Femoral Vein: No evidence of thrombus. Normal compressibility, respiratory phasicity and response to augmentation. Popliteal Vein: No evidence of thrombus. Normal compressibility, respiratory phasicity and response to augmentation. Calf Veins: Limited evaluation secondary to soft tissue edema. No evidence of  thrombus. Superficial Great Saphenous Vein: No evidence of thrombus. Normal compressibility and flow on color Doppler imaging. Venous Reflux:  None. Other Findings:  None. IMPRESSION: No evidence of deep venous thrombosis of the right lower extremity. Electronically Signed   By: Kathreen Devoid   On: 04/16/2016 12:37   US Arterial Seg Single  Result Date: 04/30/2016 CLINICAL DATA:  Right foot rest pain for 3 weeks with erythema EXAM: NONINVASIVE PHYSIOLOGIC VASCULAR STUDY OF BILATERAL LOWER EXTREMITIES TECHNIQUE: Evaluation of both lower extremities were performed at rest, including calculation of ankle-brachial indices with single level Doppler, pressure and pulse volume recording. COMPARISON:  None. FINDINGS: Right ABI:  1.04 Left ABI:  0.93 Right Lower Extremity: The right posterior tibial and dorsalis pedis waveforms are triphasic. Left Lower Extremity: The posterior tibial Doppler waveform is triphasic. The dorsalis pedis is monophasic. IMPRESSION: There is no significant arterial occlusive disease to result in arterial insufficiency. Triphasic waveforms are noted at the right ankle. The left posterior tibial is triphasic while the dorsalis pedis is somewhat diseased. Electronically Signed   By: Marybelle Killings M.D.   On: 04/30/2016 16:55   Dg Chest Port 1 View  Result Date: 04/01/2016 CLINICAL DATA:  Shortness of breath. EXAM: PORTABLE CHEST 1 VIEW COMPARISON:  Radiograph 03/28/2016, chest CT 03/24/2016 FINDINGS: Accessed right chest port with tip at the atrial caval junction. New from prior exam confluent left basilar opacity and possible pleural effusion. Additionally there is new patchy opacity in the left suprahilar lung. Advanced emphysematous change again seen. The heart size is unchanged. There is aortic atherosclerosis. No pneumothorax. IMPRESSION: Development of left lung opacities, confluent in the lower lobe with possible left pleural effusion. Patchy left suprahilar opacity. Findings may be  pneumonia or aspiration. Emphysema.  Aortic atherosclerosis. Electronically Signed   By: Jeb Levering M.D.   On: 04/01/2016 19:46   Dg Foot 2 Views Right  Result Date: 04/26/2016 CLINICAL DATA:  66 year old female with a history of redness swelling and pain no injury. EXAM: RIGHT FOOT - 2 VIEW COMPARISON:  None. FINDINGS: Pronounced swelling of the right foot and ankle, most evident on the lateral view. No radiopaque foreign body. No displaced fracture. Mild degenerative changes of the forefoot, midfoot, hindfoot. IMPRESSION: Nonspecific soft tissue swelling of the right foot and ankle. No acute fracture identified. Electronically Signed   By: Corrie Mckusick D.O.   On: 04/26/2016 12:49        Discharge Exam: Vitals:   04/30/16 2200 05/01/16 0543  BP: 110/61 119/60  Pulse: 76 80  Resp: 18 20  Temp: 98.2 F (36.8 C) 98.4 F (36.9 C)   Vitals:   04/30/16 2236 05/01/16 0543 05/01/16 0827 05/01/16 0830  BP:  119/60    Pulse:  80    Resp:  20    Temp:  98.4 F (36.9 C)    TempSrc:  Oral    SpO2: 97% 98% 91% 96%  Weight:      Height:  General: Pt is alert, awake, not in acute distress Cardiovascular: RRR, S1/S2 +, no rubs, no gallops Respiratory: CTA bilaterally, no wheezing, no rhonchi Abdominal: Soft, NT, ND, bowel sounds + Extremities: no edema, no cyanosis   The results of significant diagnostics from this hospitalization (including imaging, microbiology, ancillary and laboratory) are listed below for reference.    Significant Diagnostic Studies: US Venous Img Lower Unilateral Right  Result Date: 04/16/2016 CLINICAL DATA:  Right lower extremity edema. EXAM: RIGHT LOWER EXTREMITY VENOUS DOPPLER ULTRASOUND TECHNIQUE: Gray-scale sonography with graded compression, as well as color Doppler and duplex ultrasound were performed to evaluate the lower extremity deep venous systems from the level of the common femoral vein and including the common femoral, femoral, profunda  femoral, popliteal and calf veins including the posterior tibial, peroneal and gastrocnemius veins when visible. The superficial great saphenous vein was also interrogated. Spectral Doppler was utilized to evaluate flow at rest and with distal augmentation maneuvers in the common femoral, femoral and popliteal veins. COMPARISON:  None. FINDINGS: Contralateral Common Femoral Vein: Respiratory phasicity is normal and symmetric with the symptomatic side. No evidence of thrombus. Normal compressibility. Common Femoral Vein: No evidence of thrombus. Normal compressibility, respiratory phasicity and response to augmentation. Saphenofemoral Junction: No evidence of thrombus. Normal compressibility and flow on color Doppler imaging. Profunda Femoral Vein: No evidence of thrombus. Normal compressibility and flow on color Doppler imaging. Femoral Vein: No evidence of thrombus. Normal compressibility, respiratory phasicity and response to augmentation. Popliteal Vein: No evidence of thrombus. Normal compressibility, respiratory phasicity and response to augmentation. Calf Veins: Limited evaluation secondary to soft tissue edema. No evidence of thrombus. Superficial Great Saphenous Vein: No evidence of thrombus. Normal compressibility and flow on color Doppler imaging. Venous Reflux:  None. Other Findings:  None. IMPRESSION: No evidence of deep venous thrombosis of the right lower extremity. Electronically Signed   By: Kathreen Devoid   On: 04/16/2016 12:37   US Arterial Seg Single  Result Date: 04/30/2016 CLINICAL DATA:  Right foot rest pain for 3 weeks with erythema EXAM: NONINVASIVE PHYSIOLOGIC VASCULAR STUDY OF BILATERAL LOWER EXTREMITIES TECHNIQUE: Evaluation of both lower extremities were performed at rest, including calculation of ankle-brachial indices with single level Doppler, pressure and pulse volume recording. COMPARISON:  None. FINDINGS: Right ABI:  1.04 Left ABI:  0.93 Right Lower Extremity: The right posterior  tibial and dorsalis pedis waveforms are triphasic. Left Lower Extremity: The posterior tibial Doppler waveform is triphasic. The dorsalis pedis is monophasic. IMPRESSION: There is no significant arterial occlusive disease to result in arterial insufficiency. Triphasic waveforms are noted at the right ankle. The left posterior tibial is triphasic while the dorsalis pedis is somewhat diseased. Electronically Signed   By: Marybelle Killings M.D.   On: 04/30/2016 16:55   Dg Chest Port 1 View  Result Date: 04/01/2016 CLINICAL DATA:  Shortness of breath. EXAM: PORTABLE CHEST 1 VIEW COMPARISON:  Radiograph 03/28/2016, chest CT 03/24/2016 FINDINGS: Accessed right chest port with tip at the atrial caval junction. New from prior exam confluent left basilar opacity and possible pleural effusion. Additionally there is new patchy opacity in the left suprahilar lung. Advanced emphysematous change again seen. The heart size is unchanged. There is aortic atherosclerosis. No pneumothorax. IMPRESSION: Development of left lung opacities, confluent in the lower lobe with possible left pleural effusion. Patchy left suprahilar opacity. Findings may be pneumonia or aspiration. Emphysema.  Aortic atherosclerosis. Electronically Signed   By: Jeb Levering M.D.   On: 04/01/2016 19:46  Dg Foot 2 Views Right  Result Date: 04/26/2016 CLINICAL DATA:  66 year old female with a history of redness swelling and pain no injury. EXAM: RIGHT FOOT - 2 VIEW COMPARISON:  None. FINDINGS: Pronounced swelling of the right foot and ankle, most evident on the lateral view. No radiopaque foreign body. No displaced fracture. Mild degenerative changes of the forefoot, midfoot, hindfoot. IMPRESSION: Nonspecific soft tissue swelling of the right foot and ankle. No acute fracture identified. Electronically Signed   By: Corrie Mckusick D.O.   On: 04/26/2016 12:49     Microbiology: Recent Results (from the past 240 hour(s))  Blood Culture (routine x 2)      Status: None   Collection Time: 04/26/16 12:36 PM  Result Value Ref Range Status   Specimen Description LEFT ANTECUBITAL  Final   Special Requests BOTTLES DRAWN AEROBIC AND ANAEROBIC [facility] EACH  Final   Culture NO GROWTH 5 DAYS  Final   Report Status 05/01/2016 FINAL  Final  Blood Culture (routine x 2)     Status: None   Collection Time: 04/26/16 12:43 PM  Result Value Ref Range Status   Specimen Description RIGHT ANTECUBITAL  Final   Special Requests BOTTLES DRAWN AEROBIC AND ANAEROBIC [facility] EACH  Final   Culture NO GROWTH 5 DAYS  Final   Report Status 05/01/2016 FINAL  Final     Labs: Basic Metabolic Panel:  Recent Labs Lab 04/26/16 1234 04/27/16 0654 04/28/16 0548 04/30/16 0449  NA 137 137 136 137  K 3.2* 3.9 3.9 4.7  CL 102 107 106 103  CO2 24 23 25 28   GLUCOSE 98 116* 109* 98  BUN 19 20 17 12   CREATININE 1.10* 1.29* 1.08* 1.05*  CALCIUM 8.5* 7.8* 8.0* 8.3*   Liver Function Tests:  Recent Labs Lab 04/26/16 1234  AST 38  ALT 48  ALKPHOS 106  BILITOT 0.7  PROT 6.7  ALBUMIN 2.9*   No results for input(s): LIPASE, AMYLASE in the last 168 hours. No results for input(s): AMMONIA in the last 168 hours. CBC:  Recent Labs Lab 04/26/16 1234 04/27/16 0654 04/28/16 0548 04/30/16 0449  WBC 5.3 5.2 5.2 7.1  NEUTROABS 2.9  --   --   --   HGB 11.2* 9.3* 9.6* 10.0*  HCT 35.5* 29.2* 30.4* 32.1*  MCV 97.0 97.0 97.7 100.3*  PLT 136* 153 179 266   Cardiac Enzymes: No results for input(s): CKTOTAL, CKMB, CKMBINDEX, TROPONINI in the last 168 hours. BNP: Invalid input(s): POCBNP CBG: No results for input(s): GLUCAP in the last 168 hours.  Time coordinating discharge:  Greater than 30 minutes  Signed:  Ronnel Zuercher, DO [facility] Pager: [phone] 05/01/2016, 11:07 AM

## 2016-04-30 NOTE — Progress Notes (Signed)
PROGRESS NOTE  Lindsay Salazar JKD:[id number] DOB: 12/17/1950 DOA: 04/26/2016 PCP: [name], PA-C  Brief History:  66 year old female with a history of chronic respiratory failure on 2 L, COPD, chronic atrial fibrillation, and pancreatic cancer presented with 2-3 week history of worsening right lower extremity pain, edema, and erythema. The patient was started on vancomycin and meropenem with improvement of her erythema and pain. The patient was given furosemide 40 mg IV with improvement of her lower extremity edema. Unfortunately, the patient continues to smoke. Blood cultures have remained negative.  Assessment/Plan: Cellulitis of right lower extremity -Discontinue meropenem -Continue vancomycin -04/16/2016 RLE duplex neg for DVT -While the patient certainly has a component of infectious process, I feel that the patient's chronic erythema and pain may be multifactorial including venous stasis, and possibly underlying peripheral vascular disease or paraneoplastic syndrome  -check ABI -start gabapentin  Chronic respiratory failure with hypoxia -Stable on 2 L -Continue bronchodilators  COPD -Continue the lateral -Stable on 2 L  Chronic atrial fibrillation -Heart rate controlled -Continue diltiazem CD -Continue rivaroxaban  Hypertension -Controlled -Continue diltiazem and metoprolol tartrate  Pancreatic adenocarcinoma -S/P EUS by Dr. Ardis Hughs on 11/29/2015 with FNA demonstrating adenocarcinoma -re-evaluated for distal pancreatectomy by Dr. Barry Dienes on 03/19/2016.  Dr. Barry Dienes has arranged for pulmonology and cardiology clearance and anticipates seeing patient back following this clearance.  Deconditioning -PT eval  Disposition Plan:   Home 3/29 if stable Family Communication:  No Family at bedside--Total time spent 35 minutes.  Greater than 50% spent face to face counseling and coordinating care.   Consultants:  none  Code Status:  FULL   DVT Prophylaxis:   Xarelto   Procedures: As Listed in Progress Note Above  Antibiotics: None    Subjective: Patient states the pain in the right lower extremity is 50% better as well as the edema. She says the erythema is also improved. Denies any fevers, chills, chest pain, nausea, vomiting, diarrhea, abdominal pain. No dysuria or hematuria. No rashes.  Objective: Vitals:   04/30/16 0252 04/30/16 0645 04/30/16 0830 04/30/16 0834  BP:  (!) 116/54    Pulse:  (!) 108    Resp:  20    Temp:  97.6 F (36.4 C)    TempSrc:  Oral    SpO2: 97% 100% 98% 100%  Weight:      Height:        Intake/Output Summary (Last 24 hours) at 04/30/16 1134 Last data filed at 04/30/16 0900  Gross per 24 hour  Intake              720 ml  Output             2100 ml  Net            -1380 ml   Weight change:  Exam:   General:  Pt is alert, follows commands appropriately, not in acute distress  HEENT: No icterus, No thrush, No neck mass, [place]/AT  Cardiovascular: RRR, S1/S2, no rubs, no gallops  Respiratory: CTA bilaterally, no wheezing, no crackles, no rhonchi  Abdomen: Soft/+BS, non tender, non distended, no guarding Extremities: No edema, Erythema of the right lower extremity from the toes to the infrapatellar area with scattered petechiae. There are non-blanchable portions of erythema. Scattered scabs on the dorsum of the right lower extremity without purulent drainage. Dorsalis pedis pulse palpable  Data Reviewed: I have personally reviewed following labs and imaging studies Basic Metabolic  Panel:  Recent Labs Lab 04/24/16 0958 04/26/16 1234 04/27/16 0654 04/28/16 0548 04/30/16 0449  NA 134* 137 137 136 137  K 3.6 3.2* 3.9 3.9 4.7  CL 100* 102 107 106 103  CO2 22 24 23 25 28   GLUCOSE 123* 98 116* 109* 98  BUN 24* 19 20 17 12   CREATININE 1.27* 1.10* 1.29* 1.08* 1.05*  CALCIUM 8.4* 8.5* 7.8* 8.0* 8.3*   Liver Function Tests:  Recent Labs Lab 04/24/16 0958 04/26/16 1234  AST 47* 38  ALT 59*  48  ALKPHOS 102 106  BILITOT 0.5 0.7  PROT 6.5 6.7  ALBUMIN 2.8* 2.9*   No results for input(s): LIPASE, AMYLASE in the last 168 hours. No results for input(s): AMMONIA in the last 168 hours. Coagulation Profile: No results for input(s): INR, PROTIME in the last 168 hours. CBC:  Recent Labs Lab 04/24/16 0958 04/26/16 1234 04/27/16 0654 04/28/16 0548 04/30/16 0449  WBC 7.9 5.3 5.2 5.2 7.1  NEUTROABS 6.0 2.9  --   --   --   HGB 9.7* 11.2* 9.3* 9.6* 10.0*  HCT 30.5* 35.5* 29.2* 30.4* 32.1*  MCV 98.1 97.0 97.0 97.7 100.3*  PLT 82* 136* 153 179 266   Cardiac Enzymes: No results for input(s): CKTOTAL, CKMB, CKMBINDEX, TROPONINI in the last 168 hours. BNP: Invalid input(s): POCBNP CBG: No results for input(s): GLUCAP in the last 168 hours. HbA1C: No results for input(s): HGBA1C in the last 72 hours. Urine analysis:    Component Value Date/Time   COLORURINE YELLOW 04/26/2016 1800   APPEARANCEUR HAZY (A) 04/26/2016 1800   LABSPEC 1.017 04/26/2016 1800   PHURINE 5.0 04/26/2016 1800   GLUCOSEU NEGATIVE 04/26/2016 1800   HGBUR NEGATIVE 04/26/2016 1800   BILIRUBINUR NEGATIVE 04/26/2016 1800   KETONESUR NEGATIVE 04/26/2016 1800   PROTEINUR NEGATIVE 04/26/2016 1800   NITRITE NEGATIVE 04/26/2016 1800   LEUKOCYTESUR SMALL (A) 04/26/2016 1800   Sepsis Labs: @LABRCNTIP (procalcitonin:4,lacticidven:4) ) Recent Results (from the past 240 hour(s))  Blood Culture (routine x 2)     Status: None (Preliminary result)   Collection Time: 04/26/16 12:36 PM  Result Value Ref Range Status   Specimen Description LEFT ANTECUBITAL  Final   Special Requests BOTTLES DRAWN AEROBIC AND ANAEROBIC 6CC EACH  Final   Culture NO GROWTH 4 DAYS  Final   Report Status PENDING  Incomplete  Blood Culture (routine x 2)     Status: None (Preliminary result)   Collection Time: 04/26/16 12:43 PM  Result Value Ref Range Status   Specimen Description RIGHT ANTECUBITAL  Final   Special Requests BOTTLES DRAWN  AEROBIC AND ANAEROBIC 6CC EACH  Final   Culture NO GROWTH 4 DAYS  Final   Report Status PENDING  Incomplete     Scheduled Meds: . diltiazem  360 mg Oral Daily  . gabapentin  100 mg Oral TID  . guaiFENesin  1,200 mg Oral BID  . ipratropium-albuterol  3 mL Nebulization Q6H  . mouth rinse  15 mL Mouth Rinse BID  . meropenem (MERREM) IV  1 g Intravenous Q8H  . metoprolol  50 mg Oral BID  . mometasone-formoterol  2 puff Inhalation BID  . pantoprazole  40 mg Oral Daily  . potassium chloride  60 mEq Oral Daily  . rivaroxaban  15 mg Oral Q supper  . sodium chloride flush  3 mL Intravenous Q12H   Continuous Infusions:  Procedures/Studies: US Venous Img Lower Unilateral Right  Result Date: 04/16/2016 CLINICAL DATA:  Right lower extremity  edema. EXAM: RIGHT LOWER EXTREMITY VENOUS DOPPLER ULTRASOUND TECHNIQUE: Gray-scale sonography with graded compression, as well as color Doppler and duplex ultrasound were performed to evaluate the lower extremity deep venous systems from the level of the common femoral vein and including the common femoral, femoral, profunda femoral, popliteal and calf veins including the posterior tibial, peroneal and gastrocnemius veins when visible. The superficial great saphenous vein was also interrogated. Spectral Doppler was utilized to evaluate flow at rest and with distal augmentation maneuvers in the common femoral, femoral and popliteal veins. COMPARISON:  None. FINDINGS: Contralateral Common Femoral Vein: Respiratory phasicity is normal and symmetric with the symptomatic side. No evidence of thrombus. Normal compressibility. Common Femoral Vein: No evidence of thrombus. Normal compressibility, respiratory phasicity and response to augmentation. Saphenofemoral Junction: No evidence of thrombus. Normal compressibility and flow on color Doppler imaging. Profunda Femoral Vein: No evidence of thrombus. Normal compressibility and flow on color Doppler imaging. Femoral Vein: No  evidence of thrombus. Normal compressibility, respiratory phasicity and response to augmentation. Popliteal Vein: No evidence of thrombus. Normal compressibility, respiratory phasicity and response to augmentation. Calf Veins: Limited evaluation secondary to soft tissue edema. No evidence of thrombus. Superficial Great Saphenous Vein: No evidence of thrombus. Normal compressibility and flow on color Doppler imaging. Venous Reflux:  None. Other Findings:  None. IMPRESSION: No evidence of deep venous thrombosis of the right lower extremity. Electronically Signed   By: Kathreen Devoid   On: 04/16/2016 12:37   Dg Chest Port 1 View  Result Date: 04/01/2016 CLINICAL DATA:  Shortness of breath. EXAM: PORTABLE CHEST 1 VIEW COMPARISON:  Radiograph 03/28/2016, chest CT 03/24/2016 FINDINGS: Accessed right chest port with tip at the atrial caval junction. New from prior exam confluent left basilar opacity and possible pleural effusion. Additionally there is new patchy opacity in the left suprahilar lung. Advanced emphysematous change again seen. The heart size is unchanged. There is aortic atherosclerosis. No pneumothorax. IMPRESSION: Development of left lung opacities, confluent in the lower lobe with possible left pleural effusion. Patchy left suprahilar opacity. Findings may be pneumonia or aspiration. Emphysema.  Aortic atherosclerosis. Electronically Signed   By: Jeb Levering M.D.   On: 04/01/2016 19:46   Dg Foot 2 Views Right  Result Date: 04/26/2016 CLINICAL DATA:  66 year old female with a history of redness swelling and pain no injury. EXAM: RIGHT FOOT - 2 VIEW COMPARISON:  None. FINDINGS: Pronounced swelling of the right foot and ankle, most evident on the lateral view. No radiopaque foreign body. No displaced fracture. Mild degenerative changes of the forefoot, midfoot, hindfoot. IMPRESSION: Nonspecific soft tissue swelling of the right foot and ankle. No acute fracture identified. Electronically Signed    By: Corrie Mckusick D.O.   On: 04/26/2016 12:49    Hendrik Donath, DO  [facility] Pager [phone]  If 7PM-7AM, please contact night-coverage [email or web address] Password [facility] 04/30/2016, 11:34 AM   LOS: 4 days

## 2016-05-01 ENCOUNTER — Ambulatory Visit (HOSPITAL_COMMUNITY): Payer: Medicare HMO

## 2016-05-01 DIAGNOSIS — C254 Malignant neoplasm of endocrine pancreas: Secondary | ICD-10-CM

## 2016-05-01 LAB — CULTURE, BLOOD (ROUTINE X 2)
CULTURE: NO GROWTH
CULTURE: NO GROWTH

## 2016-05-01 MED ORDER — RIVAROXABAN 15 MG PO TABS: 15.0000 mg | ORAL_TABLET | Freq: Every day | ORAL | 0 refills | Status: DC

## 2016-05-01 MED ORDER — DOXYCYCLINE HYCLATE 100 MG PO TABS: 100.0000 mg | ORAL_TABLET | Freq: Two times a day (BID) | ORAL | 0 refills | Status: DC

## 2016-05-01 MED ORDER — GABAPENTIN 100 MG PO CAPS: 100.0000 mg | ORAL_CAPSULE | Freq: Three times a day (TID) | ORAL | 0 refills | Status: DC

## 2016-05-01 NOTE — Progress Notes (Signed)
Notified CM earlir today for tank to take home per patient request.  Then I called [facility]AT again for patient/family request for electrotonic small tank to cary around.  MD stated that she would have to notify her primary care MD.  Pt/husband verbalized understanding.Patient discharged with instructions, prescription, and care notes.  Verbalized understanding via teach back.  IV was removed and the site was WNL. Patient voiced no further complaints or concerns at the time of discharge.  Appointments scheduled per instructions.  Patient left the floor via w/c family  And staff in stable condition.

## 2016-05-01 NOTE — Care Management (Signed)
Patient requesting [facility] for home. Lindsay Salazar of [facility] will be notified and will obtain orders from chart.

## 2016-05-02 ENCOUNTER — Encounter (HOSPITAL_COMMUNITY): Payer: Self-pay | Admitting: Emergency Medicine

## 2016-05-02 ENCOUNTER — Emergency Department (HOSPITAL_COMMUNITY)
Admission: EM | Admit: 2016-05-02 | Discharge: 2016-05-03 | Disposition: A | Discharge status: Home / Self Care | Payer: Medicare HMO | Attending: Emergency Medicine | Admitting: Emergency Medicine

## 2016-05-02 DIAGNOSIS — R600 Localized edema: Secondary | ICD-10-CM

## 2016-05-02 DIAGNOSIS — Z8507 Personal history of malignant neoplasm of pancreas: Secondary | ICD-10-CM | POA: Insufficient documentation

## 2016-05-02 DIAGNOSIS — Z79899 Other long term (current) drug therapy: Secondary | ICD-10-CM | POA: Diagnosis not present

## 2016-05-02 DIAGNOSIS — M7989 Other specified soft tissue disorders: Secondary | ICD-10-CM | POA: Diagnosis present

## 2016-05-02 DIAGNOSIS — J449 Chronic obstructive pulmonary disease, unspecified: Secondary | ICD-10-CM | POA: Diagnosis not present

## 2016-05-02 DIAGNOSIS — Z87891 Personal history of nicotine dependence: Secondary | ICD-10-CM | POA: Insufficient documentation

## 2016-05-02 DIAGNOSIS — J45909 Unspecified asthma, uncomplicated: Secondary | ICD-10-CM | POA: Insufficient documentation

## 2016-05-02 DIAGNOSIS — L03115 Cellulitis of right lower limb: Secondary | ICD-10-CM | POA: Diagnosis not present

## 2016-05-02 DIAGNOSIS — I10 Essential (primary) hypertension: Secondary | ICD-10-CM | POA: Diagnosis not present

## 2016-05-02 LAB — CBC WITH DIFFERENTIAL/PLATELET
BASOS ABS: 0.1 10*3/uL (ref 0.0–0.1)
Basophils Relative: 1 %
EOS PCT: 1 %
Eosinophils Absolute: 0.1 10*3/uL (ref 0.0–0.7)
HEMATOCRIT: 33.2 % — AB (ref 36.0–46.0)
Hemoglobin: 10.3 g/dL — ABNORMAL LOW (ref 12.0–15.0)
LYMPHS ABS: 2.1 10*3/uL (ref 0.7–4.0)
Lymphocytes Relative: 24 %
MCH: 31 pg (ref 26.0–34.0)
MCHC: 31 g/dL (ref 30.0–36.0)
MCV: 100 fL (ref 78.0–100.0)
MONO ABS: 1.3 10*3/uL — AB (ref 0.1–1.0)
Monocytes Relative: 14 %
NEUTROS ABS: 5.5 10*3/uL (ref 1.7–7.7)
Neutrophils Relative %: 60 %
PLATELETS: 367 10*3/uL (ref 150–400)
RBC: 3.32 MIL/uL — AB (ref 3.87–5.11)
RDW: 18.9 % — AB (ref 11.5–15.5)
WBC: 9.1 10*3/uL (ref 4.0–10.5)

## 2016-05-02 LAB — BASIC METABOLIC PANEL
ANION GAP: 6 (ref 5–15)
BUN: 15 mg/dL (ref 6–20)
CO2: 27 mmol/L (ref 22–32)
Calcium: 8.4 mg/dL — ABNORMAL LOW (ref 8.9–10.3)
Chloride: 104 mmol/L (ref 101–111)
Creatinine, Ser: 0.8 mg/dL (ref 0.44–1.00)
GFR calc Af Amer: >60 mL/min (ref >60)
GFR calc non Af Amer: >60 mL/min (ref >60)
Glucose, Bld: 95 mg/dL (ref 65–99)
POTASSIUM: 4.3 mmol/L (ref 3.5–5.1)
Sodium: 137 mmol/L (ref 135–145)

## 2016-05-02 MED ORDER — HEPARIN SOD (PORK) LOCK FLUSH 100 UNIT/ML IV SOLN
INTRAVENOUS | Status: AC
  Administered 2016-05-03: 500 [IU] via INTRAVENOUS
  Filled 2016-05-02: qty 5

## 2016-05-02 NOTE — ED Triage Notes (Signed)
Patient from home via EMS. Patient states she was discharged from hospital yesterday ans was told to come back if right foot starts swelling.

## 2016-05-02 NOTE — ED Provider Notes (Signed)
[facility] DEPT Provider Note   CSN: [id number] Arrival date & time: 05/02/16  1722     History   Chief Complaint Chief Complaint  Patient presents with  . Leg Swelling  . Wound Check    HPI Lindsay Salazar is a 66 y.o. female.  Patient has a history of pancreatic cancer. Patient's followed by hematology oncology. Patient just was discharged from the hospital on March 29. She was admitted for right leg swelling foot swelling and redness with concern for cellulitis. Patient was treated with vancomycin and doxycycline. Discharge home on them orally. Has 2 more days course of antibiotics. Patient's concerned that the right foot has gotten more swollen and she had a wound open up on top of and she is concerned that it's gotten infection is getting worse. No other acute complaints.      Past Medical History:  Diagnosis Date  . Anemia of chronic disease   . Arthritis    bursitis , fibromyalgia  . Arthritis    "right hip" (11/27/2015)  . Asthma   . Chronic bronchitis ([place])   . Chronic respiratory failure ([place])   . Chronic right hip pain   . COPD (chronic obstructive pulmonary disease) ([place])    wears home o2 prn  . Daily headache    "last 2-3 months" (11/27/2015)  . DVT (deep venous thrombosis) ([place]) 03/2016  . Exposure to TB    "mom had it when she was pregnant with me"  . Fibromyalgia   . GERD (gastroesophageal reflux disease)    use to have it but not now  . Heart murmur    14-  20 years old  . Hyperkalemia   . Hypertension   . On home oxygen therapy    11/27/2015 "whenever I need it; don't know how many liters"  . PAF (paroxysmal atrial fibrillation) ([place])   . Pancreatic cancer ([place])   . Paroxysmal atrial flutter ([place])   . Pneumonia    "several times" (11/27/2015)  . Pneumothorax     Patient Active Problem List   Diagnosis Date Noted  . Cellulitis 04/26/2016  . COPD (chronic obstructive pulmonary disease) ([place]) 04/26/2016  . Chronic respiratory failure with  hypoxia ([place]) 04/26/2016  . Hypoxemia 03/30/2016  . Acute on chronic respiratory failure with hypoxia ([place]) 03/29/2016  . Leukocytosis 03/29/2016  . Anemia of chronic disease 03/29/2016  . Essential hypertension 03/29/2016  . HCAP (healthcare-associated pneumonia) 03/28/2016  . B12 deficiency 01/24/2016  . COPD with acute exacerbation ([place]) 01/20/2016  . Pancreatic cancer ([place]) 12/07/2015  . Chronic anticoagulation-Xarelto 09/06/2015  . Gastrointestinal hemorrhage with melena 08/08/2015  . Atrial fibrillation ([place]) 07/17/2015    Past Surgical History:  Procedure Laterality Date  . ANTERIOR CERVICAL DECOMP/DISCECTOMY FUSION     "put 4 screws in"  . BACK SURGERY    . CARDIAC CATHETERIZATION  08/2004   Archie Endo 06/18/2010  . CARDIOVERSION N/A 07/18/2015   Procedure: CARDIOVERSION;  Surgeon: Josue Hector, MD;  Location: [facility] ENDO SUITE;  Service: Cardiovascular;  Laterality: N/A;  . ESOPHAGOGASTRODUODENOSCOPY N/A 08/09/2015   Procedure: ESOPHAGOGASTRODUODENOSCOPY (EGD);  Surgeon: Rogene Houston, MD;  Location: [facility] ENDO SUITE;  Service: Endoscopy;  Laterality: N/A;  . EUS N/A 11/29/2015   Procedure: UPPER ENDOSCOPIC ULTRASOUND (EUS) RADIAL;  Surgeon: Milus Banister, MD;  Location: [facility] ENDOSCOPY;  Service: Endoscopy;  Laterality: N/A;  . IR GENERIC HISTORICAL  12/18/2015   IR US GUIDE VASC ACCESS RIGHT 12/18/2015 Sandi Mariscal, MD [facility]-INTER[facility] RAD  .  IR GENERIC HISTORICAL  12/18/2015   IR FLUORO GUIDE PORT INSERTION RIGHT 12/18/2015 Sandi Mariscal, MD [facility]-INTER[facility] RAD  . TONSILLECTOMY    . TUBAL LIGATION      OB History    Gravida Para Term Preterm AB Living   2         2   SAB TAB Ectopic Multiple Live Births                   Home Medications    Prior to Admission medications   Medication Sig Start Date End Date Taking? Authorizing Provider  COMBIVENT RESPIMAT 20-100 MCG/ACT AERS respimat Inhale 1 puff into the lungs 2 (two) times daily. 12/03/15  Yes Ripudeep Krystal Eaton, MD  diltiazem  (CARDIZEM CD) 360 MG 24 hr capsule Take 1 capsule (360 mg total) by mouth daily. 02/06/16  Yes Kathie Dike, MD  doxycycline (VIBRA-TABS) 100 MG tablet Take 1 tablet (100 mg total) by mouth every 12 (twelve) hours. 05/01/16  Yes Orson Eva, MD  gabapentin (NEURONTIN) 100 MG capsule Take 1 capsule (100 mg total) by mouth 3 (three) times daily. 05/01/16  Yes Orson Eva, MD  Gemcitabine HCl (GEMZAR IV) Inject into the vein. Day 1, day 8, day 15, every 28 days   Yes Historical Provider, MD  guaiFENesin (MUCINEX) 600 MG 12 hr tablet Take 2 tablets (1,200 mg total) by mouth 2 (two) times daily. 04/04/16  Yes Ripudeep Krystal Eaton, MD  HYDROcodone-acetaminophen (NORCO) 10-325 MG tablet Take 1 tablet by mouth every 6 (six) hours as needed. 04/16/16  Yes Thomas S Kefalas, PA-C  ipratropium-albuterol (DUONEB) 0.5-2.5 (3) MG/3ML SOLN Inhale 3 mLs into the lungs every 6 (six) hours as needed (for shortness of breath).  03/03/16  Yes Historical Provider, MD  metoprolol tartrate (LOPRESSOR) 50 MG tablet Take 1 tablet (50 mg total) by mouth 2 (two) times daily. 03/02/16  Yes Kathie Dike, MD  ondansetron (ZOFRAN) 8 MG tablet Take 1 tablet by mouth daily as needed for nausea/vomiting. 12/10/15  Yes Historical Provider, MD  OXYGEN Inhale 2 L into the lungs daily as needed (for breathing).   Yes Historical Provider, MD  PACLitaxel Protein-Bound Part (ABRAXANE IV) Inject into the vein. Day 1, day 8, day 15, every 28 days   Yes Historical Provider, MD  pantoprazole (PROTONIX) 40 MG tablet Take 1 tablet (40 mg total) by mouth daily. 12/03/15  Yes Ripudeep K Rai, MD  potassium chloride 20 MEQ/15ML (10%) SOLN Take 45 mLs (60 mEq total) by mouth daily. 04/16/16  Yes Baird Cancer, PA-C  prochlorperazine (COMPAZINE) 10 MG tablet Take 1 tablet by mouth daily as needed for nausea/vomiting. 12/10/15  Yes Historical Provider, MD  Rivaroxaban (XARELTO) 15 MG TABS tablet Take 1 tablet (15 mg total) by mouth daily with supper. 05/01/16  Yes Orson Eva, MD  clindamycin (CLEOCIN) 300 MG capsule Take 300 mg by mouth 3 (three) times daily. 7 day course starting on 04/24/2016-COMPLETED COURSE 04/24/16   Historical Provider, MD  SYMBICORT 160-4.5 MCG/ACT inhaler Inhale 1 puff into the lungs 2 (two) times daily. Patient not taking: Reported on 05/02/2016 12/03/15   Ripudeep Krystal Eaton, MD    Family History Family History  Problem Relation Age of Onset  . COPD Mother   . Diabetes Father   . Stroke Father     Social History Social History  Substance Use Topics  . Smoking status: Former Smoker    Packs/day: 0.50    Years: 46.00  Types: Cigarettes    Start date: 10/09/1969    Quit date: 11/29/2015  . Smokeless tobacco: Never Used  . Alcohol use No     Comment: quit 30 years ago     Allergies   Ibuprofen; Other; Penicillins; Tiotropium bromide monohydrate; and Tramadol   Review of Systems Review of Systems  Constitutional: Negative for fever.  HENT: Negative for congestion.   Eyes: Negative for visual disturbance.  Respiratory: Negative for shortness of breath.   Cardiovascular: Positive for leg swelling. Negative for chest pain.  Gastrointestinal: Negative for abdominal pain.  Genitourinary: Negative for dysuria.  Musculoskeletal: Negative for back pain.  Skin: Positive for wound. Negative for rash.  Neurological: Negative for headaches.  Hematological: Does not bruise/bleed easily.  Psychiatric/Behavioral: Negative for confusion.     Physical Exam Updated Vital Signs BP 119/78   Pulse 64   Temp 98.2 F (36.8 C) (Oral)   Resp 19   Ht 5\' 4"  (1.626 m)   Wt 58.1 kg   SpO2 100%   BMI 21.97 kg/m   Physical Exam  Constitutional: She appears well-developed and well-nourished. No distress.  HENT:  Head: Normocephalic and atraumatic.  Mouth/Throat: Oropharynx is clear and moist.  Eyes: Conjunctivae and EOM are normal. Pupils are equal, round, and reactive to light.  Neck: Normal range of motion. Neck supple.    Cardiovascular: Normal rate, regular rhythm and normal heart sounds.   Pulmonary/Chest: Effort normal and breath sounds normal.  Port-A-Cath problem right upper chest area.  Abdominal: Soft. Bowel sounds are normal. There is no tenderness.  Musculoskeletal: She exhibits edema.  Bilateral leg swelling right greater than left. Increased redness to right leg. At ankle on top of foot. Skin tear measuring about 5 mm with serosanguineous fluid discharge. No purulence.  Neurological: She is alert.  Skin: Skin is warm. There is erythema.  Nursing note and vitals reviewed.    ED Treatments / Results  Labs (all labs ordered are listed, but only abnormal results are displayed) Labs Reviewed  CBC WITH DIFFERENTIAL/PLATELET - Abnormal; Notable for the following:       Result Value   RBC 3.32 (*)    Hemoglobin 10.3 (*)    HCT 33.2 (*)    RDW 18.9 (*)    Monocytes Absolute 1.3 (*)    All other components within normal limits  BASIC METABOLIC PANEL - Abnormal; Notable for the following:    Calcium 8.4 (*)    All other components within normal limits   Results for orders placed or performed during the hospital encounter of 05/02/16  CBC with Differential  Result Value Ref Range   WBC 9.1 4.0 - 10.5 K/uL   RBC 3.32 (L) 3.87 - 5.11 MIL/uL   Hemoglobin 10.3 (L) 12.0 - 15.0 g/dL   HCT 33.2 (L) 36.0 - 46.0 %   MCV 100.0 78.0 - 100.0 fL   MCH 31.0 26.0 - 34.0 pg   MCHC 31.0 30.0 - 36.0 g/dL   RDW 18.9 (H) 11.5 - 15.5 %   Platelets 367 150 - 400 K/uL   Neutrophils Relative % 60 %   Neutro Abs 5.5 1.7 - 7.7 K/uL   Lymphocytes Relative 24 %   Lymphs Abs 2.1 0.7 - 4.0 K/uL   Monocytes Relative 14 %   Monocytes Absolute 1.3 (H) 0.1 - 1.0 K/uL   Eosinophils Relative 1 %   Eosinophils Absolute 0.1 0.0 - 0.7 K/uL   Basophils Relative 1 %   Basophils Absolute  0.1 0.0 - 0.1 K/uL  Basic metabolic panel  Result Value Ref Range   Sodium 137 135 - 145 mmol/L   Potassium 4.3 3.5 - 5.1 mmol/L    Chloride 104 101 - 111 mmol/L   CO2 27 22 - 32 mmol/L   Glucose, Bld 95 65 - 99 mg/dL   BUN 15 6 - 20 mg/dL   Creatinine, Ser 0.80 0.44 - 1.00 mg/dL   Calcium 8.4 (L) 8.9 - 10.3 mg/dL   GFR calc non Af Amer >60 >60 mL/min   GFR calc Af Amer >60 >60 mL/min   Anion gap 6 5 - 15     EKG  EKG Interpretation None       Radiology No results found.  Procedures Procedures (including critical care time)  Medications Ordered in ED Medications - No data to display   Initial Impression / Assessment and Plan / ED Course  I have reviewed the triage vital signs and the nursing notes.  Pertinent labs & imaging results that were available during my care of the patient were reviewed by me and considered in my medical decision making (see chart for details).   patient is discharged yesterday from the hospital. Patient returns today with some increased swelling to her right foot. There was concerns during the hospitalization whether she had a cellulitis or whether we just erythema secondary to edema. Patient also has a history of pancreatic cancer followed by hematology oncology. During the admission patient was treated with vancomycin and doxycycline. She has 2 more days of these medications orally. She was to finish a 7 day course which within tomorrow. She also had Doppler studies of the right leg without a DVT and had arterial studies without any significant flow abnormalities. Patient had an open wound occur on the top of the right foot and has some serosanguineous fluid drainage there. Suggestive of edema fluid. No evidence of significant cellulitis at this time.  We'll have patient go home finish antibiotics follow-up with her doctors return for any new or worse symptoms.    Final Clinical Impressions(s) / ED Diagnoses   Final diagnoses:  Leg edema  Cellulitis of right lower extremity    New Prescriptions New Prescriptions   No medications on file     Fredia Sorrow,  MD 05/02/16 2327

## 2016-05-02 NOTE — Discharge Instructions (Signed)
Continue her antibiotics until completed course to be completed tomorrow. Return for any new or worse symptoms. Follow-up with hematology oncology is scheduled for this week.

## 2016-05-03 DIAGNOSIS — L03115 Cellulitis of right lower limb: Secondary | ICD-10-CM | POA: Diagnosis not present

## 2016-05-03 MED ORDER — HEPARIN SOD (PORK) LOCK FLUSH 100 UNIT/ML IV SOLN
500.0000 [IU] | Freq: Once | INTRAVENOUS | Status: AC
  Administered 2016-05-03: 500 [IU] via INTRAVENOUS

## 2016-05-07 ENCOUNTER — Ambulatory Visit (HOSPITAL_COMMUNITY): Payer: Medicare HMO

## 2016-05-07 ENCOUNTER — Telehealth (HOSPITAL_COMMUNITY): Payer: Self-pay

## 2016-05-07 NOTE — Telephone Encounter (Signed)
See telephone encounter note.

## 2016-05-08 ENCOUNTER — Other Ambulatory Visit: Payer: Self-pay

## 2016-05-08 ENCOUNTER — Inpatient Hospital Stay (HOSPITAL_COMMUNITY)
Admission: EM | Admit: 2016-05-08 | Discharge: 2016-05-12 | DRG: 190 | Disposition: A | Discharge status: Home / Self Care | Payer: Medicare HMO | Attending: Internal Medicine | Admitting: Internal Medicine

## 2016-05-08 ENCOUNTER — Encounter (HOSPITAL_COMMUNITY): Payer: Self-pay | Admitting: Cardiology

## 2016-05-08 ENCOUNTER — Emergency Department (HOSPITAL_COMMUNITY): Payer: Medicare HMO

## 2016-05-08 DIAGNOSIS — Z888 Allergy status to other drugs, medicaments and biological substances status: Secondary | ICD-10-CM

## 2016-05-08 DIAGNOSIS — J90 Pleural effusion, not elsewhere classified: Secondary | ICD-10-CM | POA: Diagnosis present

## 2016-05-08 DIAGNOSIS — Z201 Contact with and (suspected) exposure to tuberculosis: Secondary | ICD-10-CM | POA: Diagnosis present

## 2016-05-08 DIAGNOSIS — E876 Hypokalemia: Secondary | ICD-10-CM | POA: Diagnosis present

## 2016-05-08 DIAGNOSIS — C259 Malignant neoplasm of pancreas, unspecified: Secondary | ICD-10-CM | POA: Diagnosis present

## 2016-05-08 DIAGNOSIS — Z79899 Other long term (current) drug therapy: Secondary | ICD-10-CM

## 2016-05-08 DIAGNOSIS — Z9981 Dependence on supplemental oxygen: Secondary | ICD-10-CM

## 2016-05-08 DIAGNOSIS — Z880 Allergy status to penicillin: Secondary | ICD-10-CM

## 2016-05-08 DIAGNOSIS — Z886 Allergy status to analgesic agent status: Secondary | ICD-10-CM

## 2016-05-08 DIAGNOSIS — J441 Chronic obstructive pulmonary disease with (acute) exacerbation: Secondary | ICD-10-CM | POA: Diagnosis not present

## 2016-05-08 DIAGNOSIS — R0602 Shortness of breath: Secondary | ICD-10-CM | POA: Diagnosis not present

## 2016-05-08 DIAGNOSIS — I482 Chronic atrial fibrillation: Secondary | ICD-10-CM | POA: Diagnosis present

## 2016-05-08 DIAGNOSIS — M1611 Unilateral primary osteoarthritis, right hip: Secondary | ICD-10-CM | POA: Diagnosis present

## 2016-05-08 DIAGNOSIS — Z8701 Personal history of pneumonia (recurrent): Secondary | ICD-10-CM

## 2016-05-08 DIAGNOSIS — Z87891 Personal history of nicotine dependence: Secondary | ICD-10-CM

## 2016-05-08 DIAGNOSIS — Z823 Family history of stroke: Secondary | ICD-10-CM

## 2016-05-08 DIAGNOSIS — Z86718 Personal history of other venous thrombosis and embolism: Secondary | ICD-10-CM

## 2016-05-08 DIAGNOSIS — Z91013 Allergy to seafood: Secondary | ICD-10-CM

## 2016-05-08 DIAGNOSIS — Z825 Family history of asthma and other chronic lower respiratory diseases: Secondary | ICD-10-CM

## 2016-05-08 DIAGNOSIS — Z7901 Long term (current) use of anticoagulants: Secondary | ICD-10-CM

## 2016-05-08 DIAGNOSIS — Z885 Allergy status to narcotic agent status: Secondary | ICD-10-CM

## 2016-05-08 DIAGNOSIS — J9621 Acute and chronic respiratory failure with hypoxia: Secondary | ICD-10-CM | POA: Diagnosis present

## 2016-05-08 DIAGNOSIS — D638 Anemia in other chronic diseases classified elsewhere: Secondary | ICD-10-CM | POA: Diagnosis present

## 2016-05-08 DIAGNOSIS — K219 Gastro-esophageal reflux disease without esophagitis: Secondary | ICD-10-CM | POA: Diagnosis present

## 2016-05-08 DIAGNOSIS — I10 Essential (primary) hypertension: Secondary | ICD-10-CM | POA: Diagnosis present

## 2016-05-08 DIAGNOSIS — Z833 Family history of diabetes mellitus: Secondary | ICD-10-CM

## 2016-05-08 DIAGNOSIS — Z79891 Long term (current) use of opiate analgesic: Secondary | ICD-10-CM

## 2016-05-08 DIAGNOSIS — I4892 Unspecified atrial flutter: Secondary | ICD-10-CM | POA: Diagnosis present

## 2016-05-08 DIAGNOSIS — R011 Cardiac murmur, unspecified: Secondary | ICD-10-CM | POA: Diagnosis present

## 2016-05-08 DIAGNOSIS — M797 Fibromyalgia: Secondary | ICD-10-CM | POA: Diagnosis present

## 2016-05-08 LAB — DIFFERENTIAL
BASOS ABS: 0 10*3/uL (ref 0.0–0.1)
BASOS PCT: 0 %
Eosinophils Absolute: 0 10*3/uL (ref 0.0–0.7)
Eosinophils Relative: 0 %
LYMPHS ABS: 1.9 10*3/uL (ref 0.7–4.0)
Lymphocytes Relative: 21 %
MONOS PCT: 15 %
Monocytes Absolute: 1.4 10*3/uL — ABNORMAL HIGH (ref 0.1–1.0)
NEUTROS ABS: 6.1 10*3/uL (ref 1.7–7.7)
NEUTROS PCT: 64 %

## 2016-05-08 LAB — COMPREHENSIVE METABOLIC PANEL
ALBUMIN: 3 g/dL — AB (ref 3.5–5.0)
ALT: 17 U/L (ref 14–54)
AST: 21 U/L (ref 15–41)
Alkaline Phosphatase: 91 U/L (ref 38–126)
Anion gap: 8 (ref 5–15)
BUN: 10 mg/dL (ref 6–20)
CHLORIDE: 101 mmol/L (ref 101–111)
CO2: 31 mmol/L (ref 22–32)
CREATININE: 0.76 mg/dL (ref 0.44–1.00)
Calcium: 8.8 mg/dL — ABNORMAL LOW (ref 8.9–10.3)
GFR calc Af Amer: >60 mL/min (ref >60)
GLUCOSE: 104 mg/dL — AB (ref 65–99)
POTASSIUM: 3.2 mmol/L — AB (ref 3.5–5.1)
Sodium: 140 mmol/L (ref 135–145)
Total Bilirubin: 0.4 mg/dL (ref 0.3–1.2)
Total Protein: 6.3 g/dL — ABNORMAL LOW (ref 6.5–8.1)

## 2016-05-08 LAB — I-STAT TROPONIN, ED: Troponin i, poc: 0 ng/mL (ref 0.00–0.08)

## 2016-05-08 LAB — CBC
HCT: 33.6 % — ABNORMAL LOW (ref 36.0–46.0)
HEMOGLOBIN: 10.4 g/dL — AB (ref 12.0–15.0)
MCH: 30.9 pg (ref 26.0–34.0)
MCHC: 31 g/dL (ref 30.0–36.0)
MCV: 99.7 fL (ref 78.0–100.0)
Platelets: 365 10*3/uL (ref 150–400)
RBC: 3.37 MIL/uL — ABNORMAL LOW (ref 3.87–5.11)
RDW: 17.4 % — ABNORMAL HIGH (ref 11.5–15.5)
WBC: 9.5 10*3/uL (ref 4.0–10.5)

## 2016-05-08 LAB — BRAIN NATRIURETIC PEPTIDE: B NATRIURETIC PEPTIDE 5: 711 pg/mL — AB (ref 0.0–100.0)

## 2016-05-08 MED ORDER — IPRATROPIUM-ALBUTEROL 0.5-2.5 (3) MG/3ML IN SOLN: 3.0000 mL | RESPIRATORY_TRACT | Status: AC

## 2016-05-08 MED ORDER — HYDROCODONE-ACETAMINOPHEN 5-325 MG PO TABS
2.0000 | ORAL_TABLET | Freq: Once | ORAL | Status: AC
  Administered 2016-05-08: 2 via ORAL
  Filled 2016-05-08: qty 2

## 2016-05-08 MED ORDER — FUROSEMIDE 10 MG/ML IJ SOLN
40.0000 mg | Freq: Once | INTRAMUSCULAR | Status: AC
  Administered 2016-05-08: 40 mg via INTRAVENOUS
  Filled 2016-05-08: qty 4

## 2016-05-08 MED ORDER — IPRATROPIUM-ALBUTEROL 0.5-2.5 (3) MG/3ML IN SOLN
3.0000 mL | RESPIRATORY_TRACT | Status: AC
  Administered 2016-05-08 (×2): 3 mL via RESPIRATORY_TRACT
  Filled 2016-05-08 (×2): qty 3

## 2016-05-08 MED ORDER — IPRATROPIUM-ALBUTEROL 0.5-2.5 (3) MG/3ML IN SOLN: 3.0000 mL | Freq: Once | RESPIRATORY_TRACT | Status: DC

## 2016-05-08 MED ORDER — PREDNISONE 10 MG PO TABS
60.0000 mg | ORAL_TABLET | Freq: Once | ORAL | Status: AC
  Administered 2016-05-08: 60 mg via ORAL
  Filled 2016-05-08: qty 1

## 2016-05-08 NOTE — ED Triage Notes (Addendum)
c/o chest pain today with activity.  Also c/o sob today.  Pt wheezing.   EMS gave 2.5 mg albuteral.  Seen here Friday for  cellulitis.  Pt on antibiodics.  VSS   CBG 189.

## 2016-05-08 NOTE — ED Notes (Signed)
Pt ambulated from room 11 to BR near room, stated she felt she was going to pass out, O2 @ 90% on nasal canula 2L

## 2016-05-08 NOTE — ED Provider Notes (Signed)
[facility] DEPT Provider Note   CSN: [id number] Arrival date & time: 05/08/16  1622     History   Chief Complaint Chief Complaint  Patient presents with  . Chest Pain    HPI Lindsay Salazar is a 66 y.o. female.  HPI 66 year old female who presents with shortness of breath. She has a history of pancreatic cancer, chronic respiratory failure on 2L home oxygen, COPD, PAF on Xarelto. Woke up this morning with shortness of breath and dull chest pain across the front of her chest, that resolved after breathing treatment. Later on in the day developed recurrent shortness of breath and difficulty breathing, worse with walking and activity,  and EMS was called. She received an albuterol treatment by EMS, and states that her breathing has improved. States she was recently in the hospital for treatment for cellulitis, and is finished her antibiotics. She still has swelling in the right leg, but is also new to notice now both legs are swollen. No fevers or chills, has been having cough with increased sputum. Currently no chest pain. No orthopnea or PND. Past Medical History:  Diagnosis Date  . Anemia of chronic disease   . Arthritis    bursitis , fibromyalgia  . Arthritis    "right hip" (11/27/2015)  . Asthma   . Chronic bronchitis ([place])   . Chronic respiratory failure ([place])   . Chronic right hip pain   . COPD (chronic obstructive pulmonary disease) ([place])    wears home o2 prn  . Daily headache    "last 2-3 months" (11/27/2015)  . DVT (deep venous thrombosis) ([place]) 03/2016  . Exposure to TB    "mom had it when she was pregnant with me"  . Fibromyalgia   . GERD (gastroesophageal reflux disease)    use to have it but not now  . Heart murmur    2-  82 years old  . Hyperkalemia   . Hypertension   . On home oxygen therapy    11/27/2015 "whenever I need it; don't know how many liters"  . PAF (paroxysmal atrial fibrillation) ([place])   . Pancreatic cancer ([place])   . Paroxysmal atrial flutter  ([place])   . Pneumonia    "several times" (11/27/2015)  . Pneumothorax     Patient Active Problem List   Diagnosis Date Noted  . COPD exacerbation ([place]) 05/09/2016  . Cellulitis 04/26/2016  . COPD (chronic obstructive pulmonary disease) ([place]) 04/26/2016  . Chronic respiratory failure with hypoxia ([place]) 04/26/2016  . Hypoxemia 03/30/2016  . Acute on chronic respiratory failure with hypoxia ([place]) 03/29/2016  . Leukocytosis 03/29/2016  . Anemia of chronic disease 03/29/2016  . Essential hypertension 03/29/2016  . HCAP (healthcare-associated pneumonia) 03/28/2016  . B12 deficiency 01/24/2016  . COPD with acute exacerbation ([place]) 01/20/2016  . Pancreatic cancer ([place]) 12/07/2015  . Chronic anticoagulation-Xarelto 09/06/2015  . Gastrointestinal hemorrhage with melena 08/08/2015  . Atrial fibrillation ([place]) 07/17/2015    Past Surgical History:  Procedure Laterality Date  . ANTERIOR CERVICAL DECOMP/DISCECTOMY FUSION     "put 4 screws in"  . BACK SURGERY    . CARDIAC CATHETERIZATION  08/2004   Archie Endo 06/18/2010  . CARDIOVERSION N/A 07/18/2015   Procedure: CARDIOVERSION;  Surgeon: Josue Hector, MD;  Location: [facility];  Service: Cardiovascular;  Laterality: N/A;  . ESOPHAGOGASTRODUODENOSCOPY N/A 08/09/2015   Procedure: ESOPHAGOGASTRODUODENOSCOPY (EGD);  Surgeon: Rogene Houston, MD;  Location: [facility] SUITE;  Service: Endoscopy;  Laterality: N/A;  . EUS N/A 11/29/2015  Procedure: UPPER ENDOSCOPIC ULTRASOUND (EUS) RADIAL;  Surgeon: Milus Banister, MD;  Location: [facility];  Service: Endoscopy;  Laterality: N/A;  . IR GENERIC HISTORICAL  12/18/2015   IR US GUIDE VASC ACCESS RIGHT 12/18/2015 Sandi Mariscal, MD [facility]  . IR GENERIC HISTORICAL  12/18/2015   IR FLUORO GUIDE PORT INSERTION RIGHT 12/18/2015 Sandi Mariscal, MD [facility]  . TONSILLECTOMY    . TUBAL LIGATION      OB History    Gravida Para Term Preterm AB Living   2         2   SAB TAB Ectopic Multiple Live Births                    Home Medications    Prior to Admission medications   Medication Sig Start Date End Date Taking? Authorizing Provider  COMBIVENT RESPIMAT 20-100 MCG/ACT AERS respimat Inhale 1 puff into the lungs 2 (two) times daily. 12/03/15  Yes Ripudeep Krystal Eaton, MD  diltiazem (CARDIZEM CD) 360 MG 24 hr capsule Take 1 capsule (360 mg total) by mouth daily. 02/06/16  Yes Kathie Dike, MD  gabapentin (NEURONTIN) 100 MG capsule Take 1 capsule (100 mg total) by mouth 3 (three) times daily. 05/01/16  Yes Orson Eva, MD  Gemcitabine HCl (GEMZAR IV) Inject into the vein. Day 1, day 8, day 15, every 28 days   Yes Historical Provider, MD  guaiFENesin (MUCINEX) 600 MG 12 hr tablet Take 2 tablets (1,200 mg total) by mouth 2 (two) times daily. 04/04/16  Yes Ripudeep Krystal Eaton, MD  HYDROcodone-acetaminophen (NORCO) 10-325 MG tablet Take 1 tablet by mouth every 6 (six) hours as needed. 04/16/16  Yes Thomas S Kefalas, PA-C  ipratropium-albuterol (DUONEB) 0.5-2.5 (3) MG/3ML SOLN Inhale 3 mLs into the lungs every 6 (six) hours as needed (for shortness of breath).  03/03/16  Yes Historical Provider, MD  metoprolol tartrate (LOPRESSOR) 50 MG tablet Take 1 tablet (50 mg total) by mouth 2 (two) times daily. 03/02/16  Yes Kathie Dike, MD  ondansetron (ZOFRAN) 8 MG tablet Take 1 tablet by mouth daily as needed for nausea/vomiting. 12/10/15  Yes Historical Provider, MD  OXYGEN Inhale 2 L into the lungs daily as needed (for breathing).   Yes Historical Provider, MD  PACLitaxel Protein-Bound Part (ABRAXANE IV) Inject into the vein. Day 1, day 8, day 15, every 28 days   Yes Historical Provider, MD  pantoprazole (PROTONIX) 40 MG tablet Take 1 tablet (40 mg total) by mouth daily. 12/03/15  Yes Ripudeep K Rai, MD  potassium chloride 20 MEQ/15ML (10%) SOLN Take 45 mLs (60 mEq total) by mouth daily. 04/16/16  Yes Baird Cancer, PA-C  prochlorperazine (COMPAZINE) 10 MG tablet Take 1 tablet by mouth daily as needed for  nausea/vomiting. 12/10/15  Yes Historical Provider, MD  Rivaroxaban (XARELTO) 15 MG TABS tablet Take 1 tablet (15 mg total) by mouth daily with supper. 05/01/16  Yes Orson Eva, MD  clindamycin (CLEOCIN) 300 MG capsule Take 300 mg by mouth 3 (three) times daily. 7 day course starting on 04/24/2016-COMPLETED COURSE 04/24/16   Historical Provider, MD  doxycycline (VIBRA-TABS) 100 MG tablet Take 1 tablet (100 mg total) by mouth every 12 (twelve) hours. Patient not taking: Reported on 05/08/2016 05/01/16   Orson Eva, MD  SYMBICORT 160-4.5 MCG/ACT inhaler Inhale 1 puff into the lungs 2 (two) times daily. Patient not taking: Reported on 05/02/2016 12/03/15   Ripudeep Krystal Eaton, MD    Family History Family  History  Problem Relation Age of Onset  . COPD Mother   . Diabetes Father   . Stroke Father     Social History Social History  Substance Use Topics  . Smoking status: Former Smoker    Packs/day: 0.50    Years: 46.00    Types: Cigarettes    Start date: 10/09/1969    Quit date: 11/29/2015  . Smokeless tobacco: Never Used  . Alcohol use No     Comment: quit 30 years ago     Allergies   Ibuprofen; Other; Penicillins; Tiotropium bromide monohydrate; and Tramadol   Review of Systems Review of Systems  Constitutional: Negative for fever.  HENT: Positive for congestion.   Respiratory: Positive for cough and shortness of breath.   Cardiovascular: Positive for leg swelling.  Gastrointestinal: Negative for blood in stool, diarrhea and vomiting.  Genitourinary: Negative for difficulty urinating.  Allergic/Immunologic: Positive for immunocompromised state.  Neurological: Negative for syncope.  Hematological: Bruises/bleeds easily.  Psychiatric/Behavioral: Negative for confusion.  All other systems reviewed and are negative.    Physical Exam Updated Vital Signs BP 127/83 (BP Location: Right Arm)   Pulse 71   Temp 97.9 F (36.6 C)   Resp 19   Ht 5\' 4"  (1.626 m)   Wt 125 lb (56.7 kg)   SpO2  100%   BMI 21.46 kg/m   Physical Exam Physical Exam  Nursing note and vitals reviewed. Constitutional: chronically ill appearing, non-toxic, and in no acute distress Head: Normocephalic and atraumatic.  Mouth/Throat: Oropharynx is clear and moist.  Neck: Normal range of motion. Neck supple.  Cardiovascular: Normal rate and regular rhythm. +1 pitting bilateral LE edema  Pulmonary/Chest: Effort normal and breath sounds with diffuse expiratory wheezing.  Abdominal: Soft. There is no tenderness. There is no rebound and no guarding.  Musculoskeletal: No deformities  Neurological: Alert, no facial droop, fluent speech, moves all extremities symmetrically Skin: Skin is warm and dry.  Psychiatric: Cooperative   ED Treatments / Results  Labs (all labs ordered are listed, but only abnormal results are displayed) Labs Reviewed  CBC - Abnormal; Notable for the following:       Result Value   RBC 3.37 (*)    Hemoglobin 10.4 (*)    HCT 33.6 (*)    RDW 17.4 (*)    All other components within normal limits  DIFFERENTIAL - Abnormal; Notable for the following:    Monocytes Absolute 1.4 (*)    All other components within normal limits  COMPREHENSIVE METABOLIC PANEL - Abnormal; Notable for the following:    Potassium 3.2 (*)    Glucose, Bld 104 (*)    Calcium 8.8 (*)    Total Protein 6.3 (*)    Albumin 3.0 (*)    All other components within normal limits  BRAIN NATRIURETIC PEPTIDE - Abnormal; Notable for the following:    B Natriuretic Peptide 711.0 (*)    All other components within normal limits  TROPONIN I  TROPONIN I  TROPONIN I  CBC  CREATININE, SERUM  CBC  COMPREHENSIVE METABOLIC PANEL  I-STAT TROPOININ, ED    EKG  EKG Interpretation  Date/Time:  Thursday May 08 2016 16:29:15 EDT Ventricular Rate:  86 PR Interval:    QRS Duration: 115 QT Interval:  442 QTC Calculation: 497 R Axis:   -6 Text Interpretation:  Atrial fibrillation Nonspecific intraventricular  conduction delay Anterior infarct, old Nonspecific ST depression Borderline ST elevation, lateral leads known atrial fibrillation, in flutter today with ST  depressions inferolaterally, seen on prior EKG Confirmed by Jaselle Pryer MD, Taryne Kiger [phone]) on 05/08/2016 4:35:12 PM       Radiology Dg Chest 2 View  Result Date: 05/08/2016 CLINICAL DATA:  Chest pain with shortness of breath EXAM: CHEST  2 VIEW COMPARISON:  04/01/2016 FINDINGS: Right-sided central venous port with tip projecting over the cavoatrial junction. Small bilateral pleural effusions, left greater than right. No focal consolidation. Stable cardiomediastinal silhouette with atherosclerosis. No pneumothorax. Cervical spine hardware. IMPRESSION: Small bilateral pleural effusions, left greater than right. Streaky atelectasis or infiltrate at the left lung base. Electronically Signed   By: Donavan Foil M.D.   On: 05/08/2016 17:31   Ct Chest Wo Contrast  Result Date: 05/08/2016 CLINICAL DATA:  66 year old female who presents with shortness of breath. She has a history of pancreatic cancer, chronic respiratory failure on 2L home oxygen, COPD, PAF on Xarelto. Woke up this morning with shortness of breath and dull chest pain across the front of her chest, hx of asthma EXAM: CT CHEST WITHOUT CONTRAST TECHNIQUE: Multidetector CT imaging of the chest was performed following the standard protocol without IV contrast. COMPARISON:  Current chest radiograph.  Chest CT, 03/24/2016. FINDINGS: Cardiovascular: Heart top-normal in size. No significant coronary artery calcifications. Great vessels normal caliber. Atherosclerotic calcifications are noted along the thoracic aorta and at the origin of the aortic arch branch vessels. Mediastinum/Nodes: No neck base or axillary masses or adenopathy. No mediastinal or hilar masses or pathologically enlarged lymph nodes. Trachea is widely patent. Esophagus is unremarkable. Lungs/Pleura: Small to moderate-sized left pleural effusion.  No right pleural effusion. Moderate emphysema. There is apical pleuroparenchymal scarring. Reticular opacities with mild intervening ground-glass opacities noted in the anterior upper lobes, likely scarring. There is focal opacity in the posterior inferior left upper lobe lingula, most likely atelectasis. Linear opacity noted at the left lung base is also likely atelectasis. There is bronchial wall thickening bilaterally most evident in the lower lobes, left greater than right. No lung masses or suspicious nodules. No evidence of pneumonia. Mild interstitial thickening is noted in the lower lungs, chronic. No evidence of pulmonary edema. Upper Abdomen: No acute findings. Large left liver lobe cyst measuring 6.7 cm, without change. Musculoskeletal: No fracture or acute finding. No osteoblastic or osteolytic lesions. Right anterior chest wall Port-A-Cath has its catheter tip in the right atrium. IMPRESSION: 1. No evidence of pneumonia. 2. Small to moderate left pleural effusion. There is bronchial wall thickening most prominent in the left lower lobe. A component of acute bronchitis should be considered. 3. Emphysema and areas of lung scarring with mild interstitial thickening, similar to the prior chest CT. Electronically Signed   By: Lajean Manes M.D.   On: 05/08/2016 18:59    Procedures Procedures (including critical care time)  Medications Ordered in ED Medications  ipratropium-albuterol (DUONEB) 0.5-2.5 (3) MG/3ML nebulizer solution 3 mL (not administered)  potassium chloride 10 mEq in 100 mL IVPB (not administered)  gabapentin (NEURONTIN) capsule 100 mg (not administered)  Rivaroxaban (XARELTO) tablet 15 mg (not administered)  HYDROcodone-acetaminophen (NORCO) 10-325 MG per tablet 1 tablet (not administered)  guaiFENesin (MUCINEX) 12 hr tablet 1,200 mg (not administered)  ipratropium-albuterol (DUONEB) 0.5-2.5 (3) MG/3ML nebulizer solution 3 mL (not administered)  metoprolol (LOPRESSOR) tablet 50  mg (not administered)  diltiazem (CARDIZEM CD) 24 hr capsule 360 mg (not administered)  pantoprazole (PROTONIX) EC tablet 40 mg (not administered)  sodium chloride flush (NS) 0.9 % injection 3 mL (not administered)  sodium chloride flush (NS)  0.9 % injection 3 mL (not administered)  0.9 %  sodium chloride infusion (not administered)  ondansetron (ZOFRAN) tablet 4 mg (not administered)    Or  ondansetron (ZOFRAN) injection 4 mg (not administered)  ipratropium-albuterol (DUONEB) 0.5-2.5 (3) MG/3ML nebulizer solution 2.5 mg (not administered)  methylPREDNISolone sodium succinate (SOLU-MEDROL) 125 mg/2 mL injection 60 mg (not administered)  predniSONE (DELTASONE) tablet 60 mg (60 mg Oral Given 05/08/16 1649)  HYDROcodone-acetaminophen (NORCO/VICODIN) 5-325 MG per tablet 2 tablet (2 tablets Oral Given 05/08/16 1649)  furosemide (LASIX) injection 40 mg (40 mg Intravenous Given 05/08/16 1815)  ipratropium-albuterol (DUONEB) 0.5-2.5 (3) MG/3ML nebulizer solution 3 mL (3 mLs Nebulization Given 05/08/16 2037)  HYDROcodone-acetaminophen (NORCO/VICODIN) 5-325 MG per tablet 2 tablet (2 tablets Oral Given 05/08/16 2339)     Initial Impression / Assessment and Plan / ED Course  I have reviewed the triage vital signs and the nursing notes.  Pertinent labs & imaging results that were available during my care of the patient were reviewed by me and considered in my medical decision making (see chart for details).     Presents with dyspnea an chest pain. Chest pain resolved after breathing treatment this morning, but dyspnea persistent. With some lower extremity edema, but on chart review prior ECHO with normal EF and no CHF history. Does takes lasix though. No edema on imaging. BnP elevated. Given lasix and improved swelling in legs in ED.   CXR visualized with ? Pneumonia for atelectasis. CT performed and shows bronchitis but no other findings. Unlikely PE as compliant on Xarelto. Given breathing treatments and  steroids in the ED. With ambulation a few steps is winded with desat to 90%.   Cardiac evaluation unremarkable.   Will plan on admit for copd exacerbation  Final Clinical Impressions(s) / ED Diagnoses   Final diagnoses:  Acute exacerbation of chronic obstructive pulmonary disease (COPD) [facility])    New Prescriptions Current Discharge Medication List       Forde Dandy, MD 05/09/16 0222

## 2016-05-09 ENCOUNTER — Encounter (HOSPITAL_COMMUNITY): Payer: Self-pay | Admitting: *Deleted

## 2016-05-09 DIAGNOSIS — J441 Chronic obstructive pulmonary disease with (acute) exacerbation: Secondary | ICD-10-CM | POA: Diagnosis present

## 2016-05-09 DIAGNOSIS — C254 Malignant neoplasm of endocrine pancreas: Secondary | ICD-10-CM | POA: Diagnosis not present

## 2016-05-09 DIAGNOSIS — Z201 Contact with and (suspected) exposure to tuberculosis: Secondary | ICD-10-CM | POA: Diagnosis present

## 2016-05-09 DIAGNOSIS — R0602 Shortness of breath: Secondary | ICD-10-CM | POA: Diagnosis present

## 2016-05-09 DIAGNOSIS — Z86718 Personal history of other venous thrombosis and embolism: Secondary | ICD-10-CM | POA: Diagnosis not present

## 2016-05-09 DIAGNOSIS — J9621 Acute and chronic respiratory failure with hypoxia: Secondary | ICD-10-CM | POA: Diagnosis present

## 2016-05-09 DIAGNOSIS — Z880 Allergy status to penicillin: Secondary | ICD-10-CM | POA: Diagnosis not present

## 2016-05-09 DIAGNOSIS — R011 Cardiac murmur, unspecified: Secondary | ICD-10-CM | POA: Diagnosis present

## 2016-05-09 DIAGNOSIS — Z9981 Dependence on supplemental oxygen: Secondary | ICD-10-CM | POA: Diagnosis not present

## 2016-05-09 DIAGNOSIS — M1611 Unilateral primary osteoarthritis, right hip: Secondary | ICD-10-CM | POA: Diagnosis present

## 2016-05-09 DIAGNOSIS — I10 Essential (primary) hypertension: Secondary | ICD-10-CM | POA: Diagnosis not present

## 2016-05-09 DIAGNOSIS — M797 Fibromyalgia: Secondary | ICD-10-CM | POA: Diagnosis present

## 2016-05-09 DIAGNOSIS — I482 Chronic atrial fibrillation: Secondary | ICD-10-CM | POA: Diagnosis present

## 2016-05-09 DIAGNOSIS — Z87891 Personal history of nicotine dependence: Secondary | ICD-10-CM | POA: Diagnosis not present

## 2016-05-09 DIAGNOSIS — J90 Pleural effusion, not elsewhere classified: Secondary | ICD-10-CM | POA: Diagnosis present

## 2016-05-09 DIAGNOSIS — I4892 Unspecified atrial flutter: Secondary | ICD-10-CM | POA: Diagnosis present

## 2016-05-09 DIAGNOSIS — Z7901 Long term (current) use of anticoagulants: Secondary | ICD-10-CM

## 2016-05-09 DIAGNOSIS — Z885 Allergy status to narcotic agent status: Secondary | ICD-10-CM | POA: Diagnosis not present

## 2016-05-09 DIAGNOSIS — C259 Malignant neoplasm of pancreas, unspecified: Secondary | ICD-10-CM | POA: Diagnosis present

## 2016-05-09 DIAGNOSIS — Z91013 Allergy to seafood: Secondary | ICD-10-CM | POA: Diagnosis not present

## 2016-05-09 DIAGNOSIS — E876 Hypokalemia: Secondary | ICD-10-CM | POA: Diagnosis present

## 2016-05-09 DIAGNOSIS — Z886 Allergy status to analgesic agent status: Secondary | ICD-10-CM | POA: Diagnosis not present

## 2016-05-09 DIAGNOSIS — Z888 Allergy status to other drugs, medicaments and biological substances status: Secondary | ICD-10-CM | POA: Diagnosis not present

## 2016-05-09 DIAGNOSIS — Z8701 Personal history of pneumonia (recurrent): Secondary | ICD-10-CM | POA: Diagnosis not present

## 2016-05-09 DIAGNOSIS — Z825 Family history of asthma and other chronic lower respiratory diseases: Secondary | ICD-10-CM | POA: Diagnosis not present

## 2016-05-09 DIAGNOSIS — D638 Anemia in other chronic diseases classified elsewhere: Secondary | ICD-10-CM | POA: Diagnosis present

## 2016-05-09 DIAGNOSIS — K219 Gastro-esophageal reflux disease without esophagitis: Secondary | ICD-10-CM | POA: Diagnosis present

## 2016-05-09 LAB — COMPREHENSIVE METABOLIC PANEL
ALT: 18 U/L (ref 14–54)
ANION GAP: 11 (ref 5–15)
AST: 22 U/L (ref 15–41)
Albumin: 3 g/dL — ABNORMAL LOW (ref 3.5–5.0)
Alkaline Phosphatase: 92 U/L (ref 38–126)
BILIRUBIN TOTAL: 0.6 mg/dL (ref 0.3–1.2)
BUN: 12 mg/dL (ref 6–20)
CHLORIDE: 99 mmol/L — AB (ref 101–111)
CO2: 28 mmol/L (ref 22–32)
Calcium: 8.7 mg/dL — ABNORMAL LOW (ref 8.9–10.3)
Creatinine, Ser: 1.03 mg/dL — ABNORMAL HIGH (ref 0.44–1.00)
GFR, EST NON AFRICAN AMERICAN: 55 mL/min — AB (ref >60)
Glucose, Bld: 166 mg/dL — ABNORMAL HIGH (ref 65–99)
POTASSIUM: 3.4 mmol/L — AB (ref 3.5–5.1)
Sodium: 138 mmol/L (ref 135–145)
TOTAL PROTEIN: 6.5 g/dL (ref 6.5–8.1)

## 2016-05-09 LAB — CBC
HEMATOCRIT: 34.6 % — AB (ref 36.0–46.0)
Hemoglobin: 10.9 g/dL — ABNORMAL LOW (ref 12.0–15.0)
MCH: 31.1 pg (ref 26.0–34.0)
MCHC: 31.5 g/dL (ref 30.0–36.0)
MCV: 98.6 fL (ref 78.0–100.0)
PLATELETS: 359 10*3/uL (ref 150–400)
RBC: 3.51 MIL/uL — ABNORMAL LOW (ref 3.87–5.11)
RDW: 17.1 % — AB (ref 11.5–15.5)
WBC: 5.8 10*3/uL (ref 4.0–10.5)

## 2016-05-09 LAB — TROPONIN I
Troponin I: <0.03 ng/mL (ref <0.03)
Troponin I: <0.03 ng/mL (ref <0.03)

## 2016-05-09 MED ORDER — PANTOPRAZOLE SODIUM 40 MG PO TBEC
40.0000 mg | DELAYED_RELEASE_TABLET | Freq: Every day | ORAL | Status: DC
  Administered 2016-05-09 – 2016-05-12 (×4): 40 mg via ORAL
  Filled 2016-05-09 (×4): qty 1

## 2016-05-09 MED ORDER — ORAL CARE MOUTH RINSE
15.0000 mL | Freq: Two times a day (BID) | OROMUCOSAL | Status: DC
  Administered 2016-05-09 – 2016-05-11 (×4): 15 mL via OROMUCOSAL

## 2016-05-09 MED ORDER — SODIUM CHLORIDE 0.9% FLUSH: 3.0000 mL | INTRAVENOUS | Status: DC | PRN

## 2016-05-09 MED ORDER — IPRATROPIUM-ALBUTEROL 0.5-2.5 (3) MG/3ML IN SOLN
3.0000 mL | Freq: Four times a day (QID) | RESPIRATORY_TRACT | Status: DC | PRN
  Administered 2016-05-10: 3 mL via RESPIRATORY_TRACT
  Filled 2016-05-09: qty 3

## 2016-05-09 MED ORDER — IPRATROPIUM-ALBUTEROL 0.5-2.5 (3) MG/3ML IN SOLN
2.5000 mg | Freq: Four times a day (QID) | RESPIRATORY_TRACT | Status: DC
  Administered 2016-05-09 (×2): 3 mg via RESPIRATORY_TRACT
  Administered 2016-05-09 – 2016-05-10 (×4): 2.5 mg via RESPIRATORY_TRACT
  Filled 2016-05-09 (×7): qty 3

## 2016-05-09 MED ORDER — POTASSIUM CHLORIDE 10 MEQ/100ML IV SOLN
10.0000 meq | INTRAVENOUS | Status: AC
  Administered 2016-05-09 (×3): 10 meq via INTRAVENOUS
  Filled 2016-05-09: qty 100

## 2016-05-09 MED ORDER — RIVAROXABAN 15 MG PO TABS
15.0000 mg | ORAL_TABLET | Freq: Every day | ORAL | Status: DC
  Administered 2016-05-09 – 2016-05-11 (×3): 15 mg via ORAL
  Filled 2016-05-09 (×4): qty 1

## 2016-05-09 MED ORDER — METOPROLOL TARTRATE 50 MG PO TABS
50.0000 mg | ORAL_TABLET | Freq: Two times a day (BID) | ORAL | Status: DC
  Administered 2016-05-09 – 2016-05-12 (×7): 50 mg via ORAL
  Filled 2016-05-09 (×7): qty 1

## 2016-05-09 MED ORDER — SODIUM CHLORIDE 0.9 % IV SOLN: 250.0000 mL | INTRAVENOUS | Status: DC | PRN

## 2016-05-09 MED ORDER — DILTIAZEM HCL ER COATED BEADS 180 MG PO CP24
360.0000 mg | ORAL_CAPSULE | Freq: Every day | ORAL | Status: DC
  Administered 2016-05-09 – 2016-05-12 (×4): 360 mg via ORAL
  Filled 2016-05-09 (×4): qty 2

## 2016-05-09 MED ORDER — METHYLPREDNISOLONE SODIUM SUCC 125 MG IJ SOLR
60.0000 mg | Freq: Four times a day (QID) | INTRAMUSCULAR | Status: DC
  Administered 2016-05-09 – 2016-05-12 (×15): 60 mg via INTRAVENOUS
  Filled 2016-05-09 (×15): qty 2

## 2016-05-09 MED ORDER — GUAIFENESIN ER 600 MG PO TB12
1200.0000 mg | ORAL_TABLET | Freq: Two times a day (BID) | ORAL | Status: DC
  Administered 2016-05-09 – 2016-05-12 (×7): 1200 mg via ORAL
  Filled 2016-05-09 (×7): qty 2

## 2016-05-09 MED ORDER — SODIUM CHLORIDE 0.9% FLUSH
3.0000 mL | Freq: Two times a day (BID) | INTRAVENOUS | Status: DC
  Administered 2016-05-09 – 2016-05-12 (×7): 3 mL via INTRAVENOUS

## 2016-05-09 MED ORDER — GABAPENTIN 100 MG PO CAPS
100.0000 mg | ORAL_CAPSULE | Freq: Three times a day (TID) | ORAL | Status: DC
  Administered 2016-05-09 – 2016-05-12 (×6): 100 mg via ORAL
  Filled 2016-05-09 (×10): qty 1

## 2016-05-09 MED ORDER — IPRATROPIUM BROMIDE 0.02 % IN SOLN: 0.5000 mg | Freq: Four times a day (QID) | RESPIRATORY_TRACT | Status: DC

## 2016-05-09 MED ORDER — ONDANSETRON HCL 4 MG PO TABS
4.0000 mg | ORAL_TABLET | Freq: Four times a day (QID) | ORAL | Status: DC | PRN
  Administered 2016-05-12: 4 mg via ORAL
  Filled 2016-05-09: qty 1

## 2016-05-09 MED ORDER — POTASSIUM CHLORIDE 10 MEQ/100ML IV SOLN
INTRAVENOUS | Status: AC
  Administered 2016-05-09: 10 meq
  Filled 2016-05-09: qty 100

## 2016-05-09 MED ORDER — ONDANSETRON HCL 4 MG/2ML IJ SOLN: 4.0000 mg | Freq: Four times a day (QID) | INTRAMUSCULAR | Status: DC | PRN

## 2016-05-09 MED ORDER — HYDROCODONE-ACETAMINOPHEN 10-325 MG PO TABS
1.0000 | ORAL_TABLET | Freq: Four times a day (QID) | ORAL | Status: DC | PRN
  Administered 2016-05-09 – 2016-05-12 (×14): 1 via ORAL
  Filled 2016-05-09 (×15): qty 1

## 2016-05-09 NOTE — Progress Notes (Signed)
Patient seen and examined. Database reviewed. No family members at bedside. Admitted earlier today for SOB and mild CP. Significant wheezing remains on exam and is being treated for a COPD exacerbation. Will continue to follow.  Domingo Mend, MD [facility] Pager: [phone]

## 2016-05-09 NOTE — Care Management Obs Status (Signed)
[place] NOTIFICATION   Patient Details  Name: Lindsay Salazar MRN: [id number] Date of Birth: [birth year]   Medicare Observation Status Notification Given:  Yes    Sherald Barge, RN 05/09/2016, 2:19 PM

## 2016-05-09 NOTE — H&P (Signed)
TRH H&P    Patient Demographics:    Lindsay Salazar, is a 66 y.o. female  MRN: [id number]  DOB - 12-06-1950  Admit Date - 05/08/2016  Referring MD/NP/PA: Dr. Oleta Mouse  Outpatient Primary MD for the patient is [name], PA-C  Patient coming from: Home  Chief Complaint  Patient presents with  . Chest Pain      HPI:    Lindsay Salazar  is a 66 y.o. female, With history of chronic respiratory failure on 2 L oxygen, COPD, chronic atrial fibrillation, pancreatic cancer who was recently discharged from the hospital on 04/30/16, today came to the hospital with worsening shortness of breath and coughing up light green colored phlegm. Also complains of chest discomfort. She denies nausea vomiting or diarrhea. No fever or chills. No dysuria. Patient was recently treated for right lower extremity cellulitis and has completed the antibiotics. Also complains of swelling in the lower extremities.  In the ED patient was given 1 dose of Lasix 40 mg. Also started on DuoNeb nebulizers, prednisone. Chest x-ray showed no acute abnormality, CT chest showed no evidence of pneumonia. Small to moderate left pleural effusion.    Review of systems:    In addition to the HPI above,  No Fever-chills, No Headache, No changes with Vision or hearing, No problems swallowing food or Liquids, No Abdominal pain, No Nausea or Vomiting, bowel movements are regular, No Blood in stool or Urine, No dysuria, No new skin rashes or bruises, No new joints pains-aches,  No new weakness, tingling, numbness in any extremity, No recent weight gain or loss, No polyuria, polydypsia or polyphagia, No significant Mental Stressors.  A full 10 point Review of Systems was done, except as stated above, all other Review of Systems were negative.   With Past History of the following :    Past Medical History:  Diagnosis Date  . Anemia of chronic disease   .  Arthritis    bursitis , fibromyalgia  . Arthritis    "right hip" (11/27/2015)  . Asthma   . Chronic bronchitis ([place])   . Chronic respiratory failure ([place])   . Chronic right hip pain   . COPD (chronic obstructive pulmonary disease) ([place])    wears home o2 prn  . Daily headache    "last 2-3 months" (11/27/2015)  . DVT (deep venous thrombosis) ([place]) 03/2016  . Exposure to TB    "mom had it when she was pregnant with me"  . Fibromyalgia   . GERD (gastroesophageal reflux disease)    use to have it but not now  . Heart murmur    55-  58 years old  . Hyperkalemia   . Hypertension   . On home oxygen therapy    11/27/2015 "whenever I need it; don't know how many liters"  . PAF (paroxysmal atrial fibrillation) ([place])   . Pancreatic cancer ([place])   . Paroxysmal atrial flutter ([place])   . Pneumonia    "several times" (11/27/2015)  . Pneumothorax       Past Surgical History:  Procedure Laterality Date  . ANTERIOR CERVICAL DECOMP/DISCECTOMY FUSION     "put 4 screws in"  . BACK SURGERY    . CARDIAC CATHETERIZATION  08/2004   Archie Endo 06/18/2010  . CARDIOVERSION N/A 07/18/2015   Procedure: CARDIOVERSION;  Surgeon: Josue Hector, MD;  Location: [facility];  Service: Cardiovascular;  Laterality: N/A;  . ESOPHAGOGASTRODUODENOSCOPY N/A 08/09/2015   Procedure: ESOPHAGOGASTRODUODENOSCOPY (EGD);  Surgeon: Rogene Houston, MD;  Location: [facility];  Service: Endoscopy;  Laterality: N/A;  . EUS N/A 11/29/2015   Procedure: UPPER ENDOSCOPIC ULTRASOUND (EUS) RADIAL;  Surgeon: Milus Banister, MD;  Location: [facility];  Service: Endoscopy;  Laterality: N/A;  . IR GENERIC HISTORICAL  12/18/2015   IR US GUIDE VASC ACCESS RIGHT 12/18/2015 Sandi Mariscal, MD [facility]  . IR GENERIC HISTORICAL  12/18/2015   IR FLUORO GUIDE PORT INSERTION RIGHT 12/18/2015 Sandi Mariscal, MD [facility]  . TONSILLECTOMY    . TUBAL LIGATION        Social History:      Social History  Substance Use Topics  .  Smoking status: Former Smoker    Packs/day: 0.50    Years: 46.00    Types: Cigarettes    Start date: 10/09/1969    Quit date: 11/29/2015  . Smokeless tobacco: Never Used  . Alcohol use No     Comment: quit 30 years ago       Family History :     Family History  Problem Relation Age of Onset  . COPD Mother   . Diabetes Father   . Stroke Father       Home Medications:   Prior to Admission medications   Medication Sig Start Date End Date Taking? Authorizing Provider  COMBIVENT RESPIMAT 20-100 MCG/ACT AERS respimat Inhale 1 puff into the lungs 2 (two) times daily. 12/03/15  Yes Ripudeep Krystal Eaton, MD  diltiazem (CARDIZEM CD) 360 MG 24 hr capsule Take 1 capsule (360 mg total) by mouth daily. 02/06/16  Yes Kathie Dike, MD  gabapentin (NEURONTIN) 100 MG capsule Take 1 capsule (100 mg total) by mouth 3 (three) times daily. 05/01/16  Yes Orson Eva, MD  Gemcitabine HCl (GEMZAR IV) Inject into the vein. Day 1, day 8, day 15, every 28 days   Yes Historical Provider, MD  guaiFENesin (MUCINEX) 600 MG 12 hr tablet Take 2 tablets (1,200 mg total) by mouth 2 (two) times daily. 04/04/16  Yes Ripudeep Krystal Eaton, MD  HYDROcodone-acetaminophen (NORCO) 10-325 MG tablet Take 1 tablet by mouth every 6 (six) hours as needed. 04/16/16  Yes Thomas S Kefalas, PA-C  ipratropium-albuterol (DUONEB) 0.5-2.5 (3) MG/3ML SOLN Inhale 3 mLs into the lungs every 6 (six) hours as needed (for shortness of breath).  03/03/16  Yes Historical Provider, MD  metoprolol tartrate (LOPRESSOR) 50 MG tablet Take 1 tablet (50 mg total) by mouth 2 (two) times daily. 03/02/16  Yes Kathie Dike, MD  ondansetron (ZOFRAN) 8 MG tablet Take 1 tablet by mouth daily as needed for nausea/vomiting. 12/10/15  Yes Historical Provider, MD  OXYGEN Inhale 2 L into the lungs daily as needed (for breathing).   Yes Historical Provider, MD  PACLitaxel Protein-Bound Part (ABRAXANE IV) Inject into the vein. Day 1, day 8, day 15, every 28 days   Yes Historical  Provider, MD  pantoprazole (PROTONIX) 40 MG tablet Take 1 tablet (40 mg total) by mouth daily. 12/03/15  Yes Ripudeep K Rai, MD  potassium chloride 20 MEQ/15ML (10%) SOLN Take 45  mLs (60 mEq total) by mouth daily. 04/16/16  Yes Baird Cancer, PA-C  prochlorperazine (COMPAZINE) 10 MG tablet Take 1 tablet by mouth daily as needed for nausea/vomiting. 12/10/15  Yes Historical Provider, MD  Rivaroxaban (XARELTO) 15 MG TABS tablet Take 1 tablet (15 mg total) by mouth daily with supper. 05/01/16  Yes Orson Eva, MD  clindamycin (CLEOCIN) 300 MG capsule Take 300 mg by mouth 3 (three) times daily. 7 day course starting on 04/24/2016-COMPLETED COURSE 04/24/16   Historical Provider, MD  doxycycline (VIBRA-TABS) 100 MG tablet Take 1 tablet (100 mg total) by mouth every 12 (twelve) hours. Patient not taking: Reported on 05/08/2016 05/01/16   Orson Eva, MD  SYMBICORT 160-4.5 MCG/ACT inhaler Inhale 1 puff into the lungs 2 (two) times daily. Patient not taking: Reported on 05/02/2016 12/03/15   Ripudeep Krystal Eaton, MD     Allergies:     Allergies  Allergen Reactions  . Ibuprofen Nausea And Vomiting  . Other     Seafood   . Penicillins Swelling    Has patient had a PCN reaction causing immediate rash, facial/tongue/throat swelling, SOB or lightheadedness with hypotension: Yes Has patient had a PCN reaction causing severe rash involving mucus membranes or skin necrosis: No Has patient had a PCN reaction that required hospitalization No Has patient had a PCN reaction occurring within the last 10 years: No If all of the above answers are "NO", then may proceed with Cephalosporin use.   . Tiotropium Bromide Monohydrate Itching  . Tramadol Nausea And Vomiting     Physical Exam:   Vitals  Blood pressure 127/83, pulse 71, temperature 97.9 F (36.6 C), resp. rate 19, height 5\' 4"  (1.626 m), weight 56.7 kg (125 lb), SpO2 100 %.  1.  General: Appears in no acute distress  2. Psychiatric:  Intact judgement and   insight, awake alert, oriented x 3.  3. Neurologic: No focal neurological deficits, all cranial nerves intact.Strength 5/5 all 4 extremities, sensation intact all 4 extremities, plantars down going.  4. Eyes :  anicteric sclerae, moist conjunctivae with no lid lag. PERRLA.  5. ENMT:  Oropharynx clear with moist mucous membranes and good dentition  6. Neck:  supple, no cervical lymphadenopathy appriciated, No thyromegaly  7. Respiratory : Normal respiratory effort, bilateral wheezing auscultated.  8. Cardiovascular : RRR, no gallops, rubs or murmurs, bilateral trace leg edema, right more than left  9. Gastrointestinal:  Positive bowel sounds, abdomen soft, non-tender to palpation,no hepatosplenomegaly, no rigidity or guarding       10. Skin:  Dried skin lesions noted on right lower extremity.  11.Musculoskeletal:  Good muscle tone,  joints appear normal , no effusions,  normal range of motion    Data Review:    CBC  Recent Labs Lab 05/02/16 2054 05/08/16 1640  WBC 9.1 9.5  HGB 10.3* 10.4*  HCT 33.2* 33.6*  PLT 367 365  MCV 100.0 99.7  MCH 31.0 30.9  MCHC 31.0 31.0  RDW 18.9* 17.4*  LYMPHSABS 2.1 1.9  MONOABS 1.3* 1.4*  EOSABS 0.1 0.0  BASOSABS 0.1 0.0   ------------------------------------------------------------------------------------------------------------------  Chemistries   Recent Labs Lab 05/02/16 2054 05/08/16 1645  NA 137 140  K 4.3 3.2*  CL 104 101  CO2 27 31  GLUCOSE 95 104*  BUN 15 10  CREATININE 0.80 0.76  CALCIUM 8.4* 8.8*  AST  --  21  ALT  --  17  ALKPHOS  --  91  BILITOT  --  0.4   ------------------------------------------------------------------------------------------------------------------  ------------------------------------------------------------------------------------------------------------------  GFR: Estimated Creatinine Clearance: 58.9 mL/min (by C-G formula based on SCr of 0.76 mg/dL). Liver Function  Tests:  Recent Labs Lab 05/08/16 1645  AST 21  ALT 17  ALKPHOS 91  BILITOT 0.4  PROT 6.3*  ALBUMIN 3.0*    --------------------------------------------------------------------------------------------------------------- Urine analysis:    Component Value Date/Time   COLORURINE YELLOW 04/26/2016 1800   APPEARANCEUR HAZY (A) 04/26/2016 1800   LABSPEC 1.017 04/26/2016 1800   PHURINE 5.0 04/26/2016 1800   GLUCOSEU NEGATIVE 04/26/2016 1800   HGBUR NEGATIVE 04/26/2016 1800   BILIRUBINUR NEGATIVE 04/26/2016 1800   KETONESUR NEGATIVE 04/26/2016 1800   PROTEINUR NEGATIVE 04/26/2016 1800   NITRITE NEGATIVE 04/26/2016 1800   LEUKOCYTESUR SMALL (A) 04/26/2016 1800      Imaging Results:    Dg Chest 2 View  Result Date: 05/08/2016 CLINICAL DATA:  Chest pain with shortness of breath EXAM: CHEST  2 VIEW COMPARISON:  04/01/2016 FINDINGS: Right-sided central venous port with tip projecting over the cavoatrial junction. Small bilateral pleural effusions, left greater than right. No focal consolidation. Stable cardiomediastinal silhouette with atherosclerosis. No pneumothorax. Cervical spine hardware. IMPRESSION: Small bilateral pleural effusions, left greater than right. Streaky atelectasis or infiltrate at the left lung base. Electronically Signed   By: Donavan Foil M.D.   On: 05/08/2016 17:31   Ct Chest Wo Contrast  Result Date: 05/08/2016 CLINICAL DATA:  66 year old female who presents with shortness of breath. She has a history of pancreatic cancer, chronic respiratory failure on 2L home oxygen, COPD, PAF on Xarelto. Woke up this morning with shortness of breath and dull chest pain across the front of her chest, hx of asthma EXAM: CT CHEST WITHOUT CONTRAST TECHNIQUE: Multidetector CT imaging of the chest was performed following the standard protocol without IV contrast. COMPARISON:  Current chest radiograph.  Chest CT, 03/24/2016. FINDINGS:  IMPRESSION: 1. No evidence of pneumonia. 2. Small to  moderate left pleural effusion. There is bronchial wall thickening most prominent in the left lower lobe. A component of acute bronchitis should be considered. 3. Emphysema and areas of lung scarring with mild interstitial thickening, similar to the prior chest CT. Electronically Signed   By: Lajean Manes M.D.   On: 05/08/2016 18:59    My personal review of EKG: Rhythm - atrial fibrillation   Assessment & Plan:    Active Problems:   Chronic anticoagulation-Xarelto   Pancreatic cancer ([place])   Essential hypertension   COPD exacerbation ([place])   1. COPD exacerbation- place under observation, start Solu-Medrol 60 mg IV every 6 hours, DuoNeb nebulizers every 6 hours, Mucinex 1 tablet by mouth twice a day. 2. Chest pain-patient also complains of chest pain, cardiac enzymes is negative in the ED, will obtain serial cardiac enzymes. 3. Chronic atrial fibrillation-heart rate is controlled, continue Cardizem, Xarelto. 4. Hypertension-blood pressure is controlled, continue Cardizem and metoprolol 5. Pancreatic adenocarcinoma-status post EUS by Dr. Ardis Hughs on 11/29/2015 with FNA showing adenocarcinoma. She was evaluated for distal pancreatectomy by Dr. Leory Plowman on 03/19/2016. Patient to follow-up Dr. Leory Plowman, she has arranged forr pulmonology and cardiology clearance as outpatient. 6. Hypokalemia- potassium is 3.2, will replace potassium with 10 mg IV KCl 3. Follow BMP in a.m.   DVT Prophylaxis-   Xarelto  AM Labs Ordered, also please review Full Orders  Family Communication: Admission, patients condition and plan of care including tests being ordered have been discussed with the patient  who indicate understanding and agree with the plan and Code Status.  Code Status:  Full code  Admission status: Observation    Time spent in minutes : 60 minutes   LAMA,GAGAN S M.D on 05/09/2016 at 12:09 AM  Between 7am to 7pm - Pager - [phone]. After 7pm go to [email or web address] - password [facility]  [facility] - Office  [phone]

## 2016-05-09 NOTE — Progress Notes (Signed)
Pt has only voided approximately 100 cc's of urine during this twelve hour shift. Bladder scanned and only shows 50 cc's. Dr. Jerilee Hoh paged and made aware.

## 2016-05-10 MED ORDER — IPRATROPIUM-ALBUTEROL 0.5-2.5 (3) MG/3ML IN SOLN
3.0000 mL | RESPIRATORY_TRACT | Status: DC | PRN
  Administered 2016-05-11: 3 mL via RESPIRATORY_TRACT
  Filled 2016-05-10: qty 3

## 2016-05-10 NOTE — Progress Notes (Signed)
PROGRESS NOTE    Lindsay Salazar  UXY:[id number] DOB: April 10, 1950 DOA: 05/08/2016 PCP: [name], PA-C     Brief Narrative:  66 y/o woman admitted from home on 4/6 due to SOB wheezing. Found to have an acute COPS exacerbation and admission requested.   Assessment & Plan:   Active Problems:   Chronic anticoagulation-Xarelto   Pancreatic cancer ([place])   Essential hypertension   COPD exacerbation ([facility])   COPD with acute exacerbation/Acute on chronic hypoxemic respiratory failure -Lung exam is improved today with diminished wheezing. -Continue steroids, nebs.  Adenocarcinoma of Pancreas -Has follow up with surgery soon for resection.  Chronic A Fib -rate controlled on cardizem. -Continue Xarelto   DVT prophylaxis: Xarelto Code Status: full code Family Communication: patient only Disposition Plan: home once medically stable, anticipate 24-48 hours  Consultants:   None  Procedures:   None  Antimicrobials:  Anti-infectives    None       Subjective: Still feels SOB especially with ambulation.  Objective: Vitals:   05/10/16 0508 05/10/16 0541 05/10/16 0829 05/10/16 1353  BP: (!) 144/78     Pulse: (!) 54     Resp: 18     Temp: 97.9 F (36.6 C)     TempSrc: Oral     SpO2: 98% 98% 97% 99%  Weight:      Height:        Intake/Output Summary (Last 24 hours) at 05/10/16 1619 Last data filed at 05/10/16 1300  Gross per 24 hour  Intake              600 ml  Output              750 ml  Net             -150 ml   Filed Weights   05/08/16 1625 05/09/16 0614  Weight: 56.7 kg (125 lb) 59.7 kg (131 lb 11.2 oz)    Examination:   General exam: Alert, awake, oriented x 3 Respiratory system: Decreased wheezing, coarse bilateral breath sounds Cardiovascular system:RRR. No murmurs, rubs, gallops. Gastrointestinal system: Abdomen is nondistended, soft and nontender. No organomegaly or masses felt. Normal bowel sounds heard. Central nervous system: Alert and  oriented. No focal neurological deficits. Extremities: No C/C/E, +pedal pulses Skin: No rashes, lesions or ulcers Psychiatry: Judgement and insight appear normal. Mood & affect appropriate.     Data Reviewed: I have personally reviewed following labs and imaging studies  CBC:  Recent Labs Lab 05/08/16 1640 05/09/16 0221  WBC 9.5 5.8  NEUTROABS 6.1  --   HGB 10.4* 10.9*  HCT 33.6* 34.6*  MCV 99.7 98.6  PLT 365 166   Basic Metabolic Panel:  Recent Labs Lab 05/08/16 1645 05/09/16 0221  NA 140 138  K 3.2* 3.4*  CL 101 99*  CO2 31 28  GLUCOSE 104* 166*  BUN 10 12  CREATININE 0.76 1.03*  CALCIUM 8.8* 8.7*   GFR: Estimated Creatinine Clearance: 45.8 mL/min (A) (by C-G formula based on SCr of 1.03 mg/dL (H)). Liver Function Tests:  Recent Labs Lab 05/08/16 1645 05/09/16 0221  AST 21 22  ALT 17 18  ALKPHOS 91 92  BILITOT 0.4 0.6  PROT 6.3* 6.5  ALBUMIN 3.0* 3.0*   No results for input(s): LIPASE, AMYLASE in the last 168 hours. No results for input(s): AMMONIA in the last 168 hours. Coagulation Profile: No results for input(s): INR, PROTIME in the last 168 hours. Cardiac Enzymes:  Recent Labs Lab  05/09/16 0221 05/09/16 0802 05/09/16 1448  TROPONINI <0.03 <0.03 <0.03   BNP (last 3 results) No results for input(s): PROBNP in the last 8760 hours. HbA1C: No results for input(s): HGBA1C in the last 72 hours. CBG: No results for input(s): GLUCAP in the last 168 hours. Lipid Profile: No results for input(s): CHOL, HDL, LDLCALC, TRIG, CHOLHDL, LDLDIRECT in the last 72 hours. Thyroid Function Tests: No results for input(s): TSH, T4TOTAL, FREET4, T3FREE, THYROIDAB in the last 72 hours. Anemia Panel: No results for input(s): VITAMINB12, FOLATE, FERRITIN, TIBC, IRON, RETICCTPCT in the last 72 hours. Urine analysis:    Component Value Date/Time   COLORURINE YELLOW 04/26/2016 1800   APPEARANCEUR HAZY (A) 04/26/2016 1800   LABSPEC 1.017 04/26/2016 1800    PHURINE 5.0 04/26/2016 1800   GLUCOSEU NEGATIVE 04/26/2016 1800   HGBUR NEGATIVE 04/26/2016 1800   BILIRUBINUR NEGATIVE 04/26/2016 1800   KETONESUR NEGATIVE 04/26/2016 1800   PROTEINUR NEGATIVE 04/26/2016 1800   NITRITE NEGATIVE 04/26/2016 1800   LEUKOCYTESUR SMALL (A) 04/26/2016 1800   Sepsis Labs: @LABRCNTIP (procalcitonin:4,lacticidven:4)  )No results found for this or any previous visit (from the past 240 hour(s)).       Radiology Studies: Dg Chest 2 View  Result Date: 05/08/2016 CLINICAL DATA:  Chest pain with shortness of breath EXAM: CHEST  2 VIEW COMPARISON:  04/01/2016 FINDINGS: Right-sided central venous port with tip projecting over the cavoatrial junction. Small bilateral pleural effusions, left greater than right. No focal consolidation. Stable cardiomediastinal silhouette with atherosclerosis. No pneumothorax. Cervical spine hardware. IMPRESSION: Small bilateral pleural effusions, left greater than right. Streaky atelectasis or infiltrate at the left lung base. Electronically Signed   By: Donavan Foil M.D.   On: 05/08/2016 17:31   Ct Chest Wo Contrast  Result Date: 05/08/2016 CLINICAL DATA:  65 year old female who presents with shortness of breath. She has a history of pancreatic cancer, chronic respiratory failure on 2L home oxygen, COPD, PAF on Xarelto. Woke up this morning with shortness of breath and dull chest pain across the front of her chest, hx of asthma EXAM: CT CHEST WITHOUT CONTRAST TECHNIQUE: Multidetector CT imaging of the chest was performed following the standard protocol without IV contrast. COMPARISON:  Current chest radiograph.  Chest CT, 03/24/2016. FINDINGS: Cardiovascular: Heart top-normal in size. No significant coronary artery calcifications. Great vessels normal caliber. Atherosclerotic calcifications are noted along the thoracic aorta and at the origin of the aortic arch branch vessels. Mediastinum/Nodes: No neck base or axillary masses or adenopathy. No  mediastinal or hilar masses or pathologically enlarged lymph nodes. Trachea is widely patent. Esophagus is unremarkable. Lungs/Pleura: Small to moderate-sized left pleural effusion. No right pleural effusion. Moderate emphysema. There is apical pleuroparenchymal scarring. Reticular opacities with mild intervening ground-glass opacities noted in the anterior upper lobes, likely scarring. There is focal opacity in the posterior inferior left upper lobe lingula, most likely atelectasis. Linear opacity noted at the left lung base is also likely atelectasis. There is bronchial wall thickening bilaterally most evident in the lower lobes, left greater than right. No lung masses or suspicious nodules. No evidence of pneumonia. Mild interstitial thickening is noted in the lower lungs, chronic. No evidence of pulmonary edema. Upper Abdomen: No acute findings. Large left liver lobe cyst measuring 6.7 cm, without change. Musculoskeletal: No fracture or acute finding. No osteoblastic or osteolytic lesions. Right anterior chest wall Port-A-Cath has its catheter tip in the right atrium. IMPRESSION: 1. No evidence of pneumonia. 2. Small to moderate left pleural effusion. There is  bronchial wall thickening most prominent in the left lower lobe. A component of acute bronchitis should be considered. 3. Emphysema and areas of lung scarring with mild interstitial thickening, similar to the prior chest CT. Electronically Signed   By: Lajean Manes M.D.   On: 05/08/2016 18:59        Scheduled Meds: . diltiazem  360 mg Oral Daily  . gabapentin  100 mg Oral TID  . guaiFENesin  1,200 mg Oral BID  . ipratropium-albuterol  2.5 mg Nebulization Q6H  . mouth rinse  15 mL Mouth Rinse BID  . methylPREDNISolone (SOLU-MEDROL) injection  60 mg Intravenous Q6H  . metoprolol  50 mg Oral BID  . pantoprazole  40 mg Oral Daily  . Rivaroxaban  15 mg Oral Q supper  . sodium chloride flush  3 mL Intravenous Q12H   Continuous Infusions:    LOS: 1 day    Time spent: 25 minutes. Greater than 50% of this time was spent in direct contact with the patient coordinating care.     Lelon Frohlich, MD [facility] Pager [phone]  If 7PM-7AM, please contact night-coverage [email or web address] Password [email or web address] 05/10/2016, 4:19 PM

## 2016-05-11 MED ORDER — IPRATROPIUM-ALBUTEROL 0.5-2.5 (3) MG/3ML IN SOLN
3.0000 mL | Freq: Four times a day (QID) | RESPIRATORY_TRACT | Status: DC
  Administered 2016-05-11 – 2016-05-12 (×5): 3 mL via RESPIRATORY_TRACT
  Filled 2016-05-11 (×4): qty 3

## 2016-05-11 NOTE — Progress Notes (Signed)
PROGRESS NOTE    Lindsay Salazar  XNA:[id number] DOB: May 11, 1950 DOA: 05/08/2016 PCP: [name], PA-C     Brief Narrative:  66 y/o woman admitted from home on 4/6 due to SOB wheezing. Found to have an acute COPS exacerbation and admission requested.   Assessment & Plan:   Active Problems:   Chronic anticoagulation-Xarelto   Pancreatic cancer ([place])   Essential hypertension   COPD exacerbation (HCC)   COPD with acute exacerbation/Acute on chronic hypoxemic respiratory failure -Lung exam continues to improve with diminished wheezing. -Continue steroids, nebs. -She still does not feel at her baseline, with increased SOB with ambulation; however oxygen requirements have not increased from her baseline.  Adenocarcinoma of Pancreas -Has follow up with surgery soon for resection.  Chronic A Fib -rate controlled on cardizem. -Continue Xarelto   DVT prophylaxis: Xarelto Code Status: full code Family Communication: patient only Disposition Plan: home once medically stable, anticipate 24-48 hours  Consultants:   None  Procedures:   None  Antimicrobials:  Anti-infectives    None       Subjective: Still feels SOB especially with ambulation.  Objective: Vitals:   05/11/16 0401 05/11/16 0420 05/11/16 0731 05/11/16 1310  BP: 135/78   136/70  Pulse: 75   80  Resp: 18   18  Temp: 98.7 F (37.1 C)   97.4 F (36.3 C)  TempSrc: Oral   Oral  SpO2: 98% 97% 98% 98%  Weight:      Height:        Intake/Output Summary (Last 24 hours) at 05/11/16 1632 Last data filed at 05/11/16 1154  Gross per 24 hour  Intake              840 ml  Output              240 ml  Net              600 ml   Filed Weights   05/08/16 1625 05/09/16 0614  Weight: 56.7 kg (125 lb) 59.7 kg (131 lb 11.2 oz)    Examination:   General exam: Alert, awake, oriented x 3 Respiratory system: Decreased wheezing, coarse bilateral breath sounds Cardiovascular system:RRR. No murmurs, rubs,  gallops. Gastrointestinal system: Abdomen is nondistended, soft and nontender. No organomegaly or masses felt. Normal bowel sounds heard. Central nervous system: Alert and oriented. No focal neurological deficits. Extremities: No C/C/E, +pedal pulses Skin: No rashes, lesions or ulcers Psychiatry: Judgement and insight appear normal. Mood & affect appropriate.     Data Reviewed: I have personally reviewed following labs and imaging studies  CBC:  Recent Labs Lab 05/08/16 1640 05/09/16 0221  WBC 9.5 5.8  NEUTROABS 6.1  --   HGB 10.4* 10.9*  HCT 33.6* 34.6*  MCV 99.7 98.6  PLT 365 542   Basic Metabolic Panel:  Recent Labs Lab 05/08/16 1645 05/09/16 0221  NA 140 138  K 3.2* 3.4*  CL 101 99*  CO2 31 28  GLUCOSE 104* 166*  BUN 10 12  CREATININE 0.76 1.03*  CALCIUM 8.8* 8.7*   GFR: Estimated Creatinine Clearance: 45.8 mL/min (A) (by C-G formula based on SCr of 1.03 mg/dL (H)). Liver Function Tests:  Recent Labs Lab 05/08/16 1645 05/09/16 0221  AST 21 22  ALT 17 18  ALKPHOS 91 92  BILITOT 0.4 0.6  PROT 6.3* 6.5  ALBUMIN 3.0* 3.0*   No results for input(s): LIPASE, AMYLASE in the last 168 hours. No results for input(s): AMMONIA  in the last 168 hours. Coagulation Profile: No results for input(s): INR, PROTIME in the last 168 hours. Cardiac Enzymes:  Recent Labs Lab 05/09/16 0221 05/09/16 0802 05/09/16 1448  TROPONINI <0.03 <0.03 <0.03   BNP (last 3 results) No results for input(s): PROBNP in the last 8760 hours. HbA1C: No results for input(s): HGBA1C in the last 72 hours. CBG: No results for input(s): GLUCAP in the last 168 hours. Lipid Profile: No results for input(s): CHOL, HDL, LDLCALC, TRIG, CHOLHDL, LDLDIRECT in the last 72 hours. Thyroid Function Tests: No results for input(s): TSH, T4TOTAL, FREET4, T3FREE, THYROIDAB in the last 72 hours. Anemia Panel: No results for input(s): VITAMINB12, FOLATE, FERRITIN, TIBC, IRON, RETICCTPCT in the last 72  hours. Urine analysis:    Component Value Date/Time   COLORURINE YELLOW 04/26/2016 1800   APPEARANCEUR HAZY (A) 04/26/2016 1800   LABSPEC 1.017 04/26/2016 1800   PHURINE 5.0 04/26/2016 1800   GLUCOSEU NEGATIVE 04/26/2016 1800   HGBUR NEGATIVE 04/26/2016 1800   BILIRUBINUR NEGATIVE 04/26/2016 1800   KETONESUR NEGATIVE 04/26/2016 1800   PROTEINUR NEGATIVE 04/26/2016 1800   NITRITE NEGATIVE 04/26/2016 1800   LEUKOCYTESUR SMALL (A) 04/26/2016 1800   Sepsis Labs: @LABRCNTIP (procalcitonin:4,lacticidven:4)  )No results found for this or any previous visit (from the past 240 hour(s)).       Radiology Studies: No results found.      Scheduled Meds: . diltiazem  360 mg Oral Daily  . gabapentin  100 mg Oral TID  . guaiFENesin  1,200 mg Oral BID  . ipratropium-albuterol  3 mL Nebulization Q6H  . mouth rinse  15 mL Mouth Rinse BID  . methylPREDNISolone (SOLU-MEDROL) injection  60 mg Intravenous Q6H  . metoprolol  50 mg Oral BID  . pantoprazole  40 mg Oral Daily  . Rivaroxaban  15 mg Oral Q supper  . sodium chloride flush  3 mL Intravenous Q12H   Continuous Infusions:   LOS: 2 days    Time spent: 25 minutes. Greater than 50% of this time was spent in direct contact with the patient coordinating care.     Lelon Frohlich, MD [facility] Pager [phone]  If 7PM-7AM, please contact night-coverage [email or web address] Password [email or web address] 05/11/2016, 4:32 PM

## 2016-05-12 MED ORDER — HYDROCODONE-ACETAMINOPHEN 10-325 MG PO TABS
1.0000 | ORAL_TABLET | ORAL | Status: AC
  Administered 2016-05-12: 1 via ORAL

## 2016-05-12 MED ORDER — PREDNISONE 10 MG PO TABS: 10.0000 mg | ORAL_TABLET | Freq: Every day | ORAL | 0 refills | Status: DC

## 2016-05-12 NOTE — Discharge Summary (Signed)
Physician Discharge Summary  Cecil Bixby [facility] ZOX:[id number] DOB: 03-06-1950 DOA: 05/08/2016  PCP: [name], PA-C  Admit date: 05/08/2016 Discharge date: 05/12/2016  Time spent: 45 minutes  Recommendations for Outpatient Follow-up:  -Will be discharged home today. -Advised to follow up with PCP in 2 weeks.   Discharge Diagnoses:  Active Problems:   Chronic anticoagulation-Xarelto   Pancreatic cancer [facility])   Essential hypertension   COPD exacerbation ([place])   Discharge Condition: Stable and improved  Filed Weights   05/08/16 1625 05/09/16 0614  Weight: 56.7 kg (125 lb) 59.7 kg (131 lb 11.2 oz)    History of present illness:  As per Dr. Darrick Meigs on 4/6: Lindsay Salazar  is a 66 y.o. female, With history of chronic respiratory failure on 2 L oxygen, COPD, chronic atrial fibrillation, pancreatic cancer who was recently discharged from the hospital on 04/30/16, today came to the hospital with worsening shortness of breath and coughing up light green colored phlegm. Also complains of chest discomfort. She denies nausea vomiting or diarrhea. No fever or chills. No dysuria. Patient was recently treated for right lower extremity cellulitis and has completed the antibiotics. Also complains of swelling in the lower extremities.  In the ED patient was given 1 dose of Lasix 40 mg. Also started on DuoNeb nebulizers, prednisone. Chest x-ray showed no acute abnormality, CT chest showed no evidence of pneumonia. Small to moderate left pleural effusion.  Hospital Course:   COPD with acute exacerbation/Acute on chronic hypoxemic respiratory failure -Lung exam continues to improve with resolved wheezing. -DC home on prednisone taper.  Adenocarcinoma of Pancreas -Has follow up with surgery soon for resection.  Chronic A Fib -rate controlled on cardizem. -Continue Xarelto  Procedures:  None   Consultations:  None  Discharge Instructions  Discharge Instructions    Diet - low sodium heart  healthy    Complete by:  As directed    Increase activity slowly    Complete by:  As directed      Allergies as of 05/12/2016      Reactions   Ibuprofen Nausea And Vomiting   Other    Seafood   Penicillins Swelling   Has patient had a PCN reaction causing immediate rash, facial/tongue/throat swelling, SOB or lightheadedness with hypotension: Yes Has patient had a PCN reaction causing severe rash involving mucus membranes or skin necrosis: No Has patient had a PCN reaction that required hospitalization No Has patient had a PCN reaction occurring within the last 10 years: No If all of the above answers are "NO", then may proceed with Cephalosporin use.   Tiotropium Bromide Monohydrate Itching   Tramadol Nausea And Vomiting      Medication List    STOP taking these medications   clindamycin 300 MG capsule Commonly known as:  CLEOCIN   doxycycline 100 MG tablet Commonly known as:  VIBRA-TABS   GEMZAR IV     TAKE these medications   ABRAXANE IV Inject into the vein. Day 1, day 8, day 15, every 28 days   COMBIVENT RESPIMAT 20-100 MCG/ACT Aers respimat Generic drug:  Ipratropium-Albuterol Inhale 1 puff into the lungs 2 (two) times daily.   ipratropium-albuterol 0.5-2.5 (3) MG/3ML Soln Commonly known as:  DUONEB Inhale 3 mLs into the lungs every 6 (six) hours as needed (for shortness of breath).   diltiazem 360 MG 24 hr capsule Commonly known as:  CARDIZEM CD Take 1 capsule (360 mg total) by mouth daily.   gabapentin 100 MG  capsule Commonly known as:  NEURONTIN Take 1 capsule (100 mg total) by mouth 3 (three) times daily.   guaiFENesin 600 MG 12 hr tablet Commonly known as:  MUCINEX Take 2 tablets (1,200 mg total) by mouth 2 (two) times daily.   HYDROcodone-acetaminophen 10-325 MG tablet Commonly known as:  NORCO Take 1 tablet by mouth every 6 (six) hours as needed.   metoprolol 50 MG tablet Commonly known as:  LOPRESSOR Take 1 tablet (50 mg total) by mouth 2  (two) times daily.   ondansetron 8 MG tablet Commonly known as:  ZOFRAN Take 1 tablet by mouth daily as needed for nausea/vomiting.   OXYGEN Inhale 2 L into the lungs daily as needed (for breathing).   pantoprazole 40 MG tablet Commonly known as:  PROTONIX Take 1 tablet (40 mg total) by mouth daily.   potassium chloride 20 MEQ/15ML (10%) Soln Take 45 mLs (60 mEq total) by mouth daily.   predniSONE 10 MG tablet Commonly known as:  DELTASONE Take 1 tablet (10 mg total) by mouth daily with breakfast. Take 6 tablets today and then decrease by 1 tablet daily until none are left.   prochlorperazine 10 MG tablet Commonly known as:  COMPAZINE Take 1 tablet by mouth daily as needed for nausea/vomiting.   Rivaroxaban 15 MG Tabs tablet Commonly known as:  XARELTO Take 1 tablet (15 mg total) by mouth daily with supper.   SYMBICORT 160-4.5 MCG/ACT inhaler Generic drug:  budesonide-formoterol Inhale 1 puff into the lungs 2 (two) times daily.      Allergies  Allergen Reactions  . Ibuprofen Nausea And Vomiting  . Other     Seafood   . Penicillins Swelling    Has patient had a PCN reaction causing immediate rash, facial/tongue/throat swelling, SOB or lightheadedness with hypotension: Yes Has patient had a PCN reaction causing severe rash involving mucus membranes or skin necrosis: No Has patient had a PCN reaction that required hospitalization No Has patient had a PCN reaction occurring within the last 10 years: No If all of the above answers are "NO", then may proceed with Cephalosporin use.   . Tiotropium Bromide Monohydrate Itching  . Tramadol Nausea And Vomiting   Follow-up Information    BAUCOM, JENNY B, PA-C Follow up on 05/22/2016.   Specialty:  Physician Assistant Why:  4:45pm Contact information: [address] [place] [address] [phone]            The results of significant diagnostics from this hospitalization (including imaging, microbiology,  ancillary and laboratory) are listed below for reference.    Significant Diagnostic Studies: Dg Chest 2 View  Result Date: 05/08/2016 CLINICAL DATA:  Chest pain with shortness of breath EXAM: CHEST  2 VIEW COMPARISON:  04/01/2016 FINDINGS: Right-sided central venous port with tip projecting over the cavoatrial junction. Small bilateral pleural effusions, left greater than right. No focal consolidation. Stable cardiomediastinal silhouette with atherosclerosis. No pneumothorax. Cervical spine hardware. IMPRESSION: Small bilateral pleural effusions, left greater than right. Streaky atelectasis or infiltrate at the left lung base. Electronically Signed   By: Donavan Foil M.D.   On: 05/08/2016 17:31   Ct Chest Wo Contrast  Result Date: 05/08/2016 CLINICAL DATA:  66 year old female who presents with shortness of breath. She has a history of pancreatic cancer, chronic respiratory failure on 2L home oxygen, COPD, PAF on Xarelto. Woke up this morning with shortness of breath and dull chest pain across the front of her chest, hx of asthma EXAM: CT  CHEST WITHOUT CONTRAST TECHNIQUE: Multidetector CT imaging of the chest was performed following the standard protocol without IV contrast. COMPARISON:  Current chest radiograph.  Chest CT, 03/24/2016. FINDINGS: Cardiovascular: Heart top-normal in size. No significant coronary artery calcifications. Great vessels normal caliber. Atherosclerotic calcifications are noted along the thoracic aorta and at the origin of the aortic arch branch vessels. Mediastinum/Nodes: No neck base or axillary masses or adenopathy. No mediastinal or hilar masses or pathologically enlarged lymph nodes. Trachea is widely patent. Esophagus is unremarkable. Lungs/Pleura: Small to moderate-sized left pleural effusion. No right pleural effusion. Moderate emphysema. There is apical pleuroparenchymal scarring. Reticular opacities with mild intervening ground-glass opacities noted in the anterior upper  lobes, likely scarring. There is focal opacity in the posterior inferior left upper lobe lingula, most likely atelectasis. Linear opacity noted at the left lung base is also likely atelectasis. There is bronchial wall thickening bilaterally most evident in the lower lobes, left greater than right. No lung masses or suspicious nodules. No evidence of pneumonia. Mild interstitial thickening is noted in the lower lungs, chronic. No evidence of pulmonary edema. Upper Abdomen: No acute findings. Large left liver lobe cyst measuring 6.7 cm, without change. Musculoskeletal: No fracture or acute finding. No osteoblastic or osteolytic lesions. Right anterior chest wall Port-A-Cath has its catheter tip in the right atrium. IMPRESSION: 1. No evidence of pneumonia. 2. Small to moderate left pleural effusion. There is bronchial wall thickening most prominent in the left lower lobe. A component of acute bronchitis should be considered. 3. Emphysema and areas of lung scarring with mild interstitial thickening, similar to the prior chest CT. Electronically Signed   By: Lajean Manes M.D.   On: 05/08/2016 18:59   US Venous Img Lower Unilateral Right  Result Date: 04/16/2016 CLINICAL DATA:  Right lower extremity edema. EXAM: RIGHT LOWER EXTREMITY VENOUS DOPPLER ULTRASOUND TECHNIQUE: Gray-scale sonography with graded compression, as well as color Doppler and duplex ultrasound were performed to evaluate the lower extremity deep venous systems from the level of the common femoral vein and including the common femoral, femoral, profunda femoral, popliteal and calf veins including the posterior tibial, peroneal and gastrocnemius veins when visible. The superficial great saphenous vein was also interrogated. Spectral Doppler was utilized to evaluate flow at rest and with distal augmentation maneuvers in the common femoral, femoral and popliteal veins. COMPARISON:  None. FINDINGS: Contralateral Common Femoral Vein: Respiratory phasicity  is normal and symmetric with the symptomatic side. No evidence of thrombus. Normal compressibility. Common Femoral Vein: No evidence of thrombus. Normal compressibility, respiratory phasicity and response to augmentation. Saphenofemoral Junction: No evidence of thrombus. Normal compressibility and flow on color Doppler imaging. Profunda Femoral Vein: No evidence of thrombus. Normal compressibility and flow on color Doppler imaging. Femoral Vein: No evidence of thrombus. Normal compressibility, respiratory phasicity and response to augmentation. Popliteal Vein: No evidence of thrombus. Normal compressibility, respiratory phasicity and response to augmentation. Calf Veins: Limited evaluation secondary to soft tissue edema. No evidence of thrombus. Superficial Great Saphenous Vein: No evidence of thrombus. Normal compressibility and flow on color Doppler imaging. Venous Reflux:  None. Other Findings:  None. IMPRESSION: No evidence of deep venous thrombosis of the right lower extremity. Electronically Signed   By: Kathreen Devoid   On: 04/16/2016 12:37   US Arterial Seg Single  Result Date: 04/30/2016 CLINICAL DATA:  Right foot rest pain for 3 weeks with erythema EXAM: NONINVASIVE PHYSIOLOGIC VASCULAR STUDY OF BILATERAL LOWER EXTREMITIES TECHNIQUE: Evaluation of both lower extremities were performed  at rest, including calculation of ankle-brachial indices with single level Doppler, pressure and pulse volume recording. COMPARISON:  None. FINDINGS: Right ABI:  1.04 Left ABI:  0.93 Right Lower Extremity: The right posterior tibial and dorsalis pedis waveforms are triphasic. Left Lower Extremity: The posterior tibial Doppler waveform is triphasic. The dorsalis pedis is monophasic. IMPRESSION: There is no significant arterial occlusive disease to result in arterial insufficiency. Triphasic waveforms are noted at the right ankle. The left posterior tibial is triphasic while the dorsalis pedis is somewhat diseased.  Electronically Signed   By: Marybelle Killings M.D.   On: 04/30/2016 16:55   Dg Foot 2 Views Right  Result Date: 04/26/2016 CLINICAL DATA:  66 year old female with a history of redness swelling and pain no injury. EXAM: RIGHT FOOT - 2 VIEW COMPARISON:  None. FINDINGS: Pronounced swelling of the right foot and ankle, most evident on the lateral view. No radiopaque foreign body. No displaced fracture. Mild degenerative changes of the forefoot, midfoot, hindfoot. IMPRESSION: Nonspecific soft tissue swelling of the right foot and ankle. No acute fracture identified. Electronically Signed   By: Corrie Mckusick D.O.   On: 04/26/2016 12:49    Microbiology: No results found for this or any previous visit (from the past 240 hour(s)).   Labs: Basic Metabolic Panel:  Recent Labs Lab 05/08/16 1645 05/09/16 0221  NA 140 138  K 3.2* 3.4*  CL 101 99*  CO2 31 28  GLUCOSE 104* 166*  BUN 10 12  CREATININE 0.76 1.03*  CALCIUM 8.8* 8.7*   Liver Function Tests:  Recent Labs Lab 05/08/16 1645 05/09/16 0221  AST 21 22  ALT 17 18  ALKPHOS 91 92  BILITOT 0.4 0.6  PROT 6.3* 6.5  ALBUMIN 3.0* 3.0*   No results for input(s): LIPASE, AMYLASE in the last 168 hours. No results for input(s): AMMONIA in the last 168 hours. CBC:  Recent Labs Lab 05/08/16 1640 05/09/16 0221  WBC 9.5 5.8  NEUTROABS 6.1  --   HGB 10.4* 10.9*  HCT 33.6* 34.6*  MCV 99.7 98.6  PLT 365 359   Cardiac Enzymes:  Recent Labs Lab 05/09/16 0221 05/09/16 0802 05/09/16 1448  TROPONINI <0.03 <0.03 <0.03   BNP: BNP (last 3 results)  Recent Labs  02/29/16 1341 03/24/16 1750 05/08/16 1645  BNP 223.0* 394.0* 711.0*    ProBNP (last 3 results) No results for input(s): PROBNP in the last 8760 hours.  CBG: No results for input(s): GLUCAP in the last 168 hours.     SignedLelon Frohlich  [facility] Pager: [phone] 05/12/2016, 6:07 PM

## 2016-05-12 NOTE — Progress Notes (Signed)
Discharge instructions and prescription given, verbalized understanding, out in stable condition via w/c with staff. 

## 2016-05-12 NOTE — Care Management Important Message (Signed)
Important Message  Patient Details  Name: Lindsay Salazar MRN: [id number] Date of Birth: 1950-03-30   Medicare Important Message Given:  Yes    Sherald Barge, RN 05/12/2016, 11:10 AM

## 2016-05-12 NOTE — Care Management Note (Signed)
Case Management Note  Patient Details  Name: Lindsay Salazar MRN: [id number] Date of Birth: 1950-12-12  Subjective/Objective:                  Pt admitted with COPD exacerbation. Pt is from home, lives with spouse. She has supplemental oxygen, RW, and neb pta. She has PCP, transportation to appointments and insurance with drug coverage. Pt has no DME needs. Pt with 7ED visits and 8 admissions in past 6 months. Pt refuses HH/community services.   Action/Plan: Pt discharging home today with self care.   Expected Discharge Date:  05/12/16               Expected Discharge Plan:  Home/Self Care  In-House Referral:  NA  Discharge planning Services  CM Consult  Post Acute Care Choice:  NA Choice offered to:  NA   Status of Service:  Completed, signed off  Sherald Barge, RN 05/12/2016, 11:10 AM

## 2016-05-14 ENCOUNTER — Ambulatory Visit (HOSPITAL_COMMUNITY): Payer: Medicare HMO

## 2016-05-15 ENCOUNTER — Encounter (HOSPITAL_BASED_OUTPATIENT_CLINIC_OR_DEPARTMENT_OTHER): Payer: Medicare HMO

## 2016-05-15 ENCOUNTER — Encounter (HOSPITAL_COMMUNITY): Payer: Medicare HMO | Attending: Hematology & Oncology | Admitting: Adult Health

## 2016-05-15 ENCOUNTER — Encounter (HOSPITAL_COMMUNITY): Payer: Self-pay | Admitting: Adult Health

## 2016-05-15 VITALS — BP 133/76 | HR 80 | Temp 98.2°F | Resp 20

## 2016-05-15 VITALS — BP 115/91 | HR 61 | Temp 98.0°F | Resp 20

## 2016-05-15 DIAGNOSIS — E538 Deficiency of other specified B group vitamins: Secondary | ICD-10-CM

## 2016-05-15 DIAGNOSIS — Z86718 Personal history of other venous thrombosis and embolism: Secondary | ICD-10-CM | POA: Diagnosis not present

## 2016-05-15 DIAGNOSIS — R0602 Shortness of breath: Secondary | ICD-10-CM

## 2016-05-15 DIAGNOSIS — C251 Malignant neoplasm of body of pancreas: Secondary | ICD-10-CM

## 2016-05-15 DIAGNOSIS — Z5111 Encounter for antineoplastic chemotherapy: Secondary | ICD-10-CM | POA: Diagnosis not present

## 2016-05-15 DIAGNOSIS — C257 Malignant neoplasm of other parts of pancreas: Secondary | ICD-10-CM

## 2016-05-15 DIAGNOSIS — Z7901 Long term (current) use of anticoagulants: Secondary | ICD-10-CM | POA: Diagnosis not present

## 2016-05-15 LAB — CBC WITH DIFFERENTIAL/PLATELET
BASOS ABS: 0 10*3/uL (ref 0.0–0.1)
Basophils Relative: 0 %
EOS ABS: 0 10*3/uL (ref 0.0–0.7)
EOS PCT: 0 %
HCT: 36.3 % (ref 36.0–46.0)
Hemoglobin: 11.4 g/dL — ABNORMAL LOW (ref 12.0–15.0)
Lymphocytes Relative: 3 %
Lymphs Abs: 0.6 10*3/uL — ABNORMAL LOW (ref 0.7–4.0)
MCH: 30.9 pg (ref 26.0–34.0)
MCHC: 31.4 g/dL (ref 30.0–36.0)
MCV: 98.4 fL (ref 78.0–100.0)
Monocytes Absolute: 1.7 10*3/uL — ABNORMAL HIGH (ref 0.1–1.0)
Monocytes Relative: 10 %
NEUTROS ABS: 14.6 10*3/uL — AB (ref 1.7–7.7)
NEUTROS PCT: 87 %
PLATELETS: 271 10*3/uL (ref 150–400)
RBC: 3.69 MIL/uL — AB (ref 3.87–5.11)
RDW: 16.8 % — ABNORMAL HIGH (ref 11.5–15.5)
WBC: 16.8 10*3/uL — ABNORMAL HIGH (ref 4.0–10.5)

## 2016-05-15 LAB — COMPREHENSIVE METABOLIC PANEL
ALBUMIN: 3.3 g/dL — AB (ref 3.5–5.0)
ALK PHOS: 79 U/L (ref 38–126)
ALT: 64 U/L — AB (ref 14–54)
AST: 37 U/L (ref 15–41)
Anion gap: 11 (ref 5–15)
BUN: 33 mg/dL — AB (ref 6–20)
CHLORIDE: 97 mmol/L — AB (ref 101–111)
CO2: 29 mmol/L (ref 22–32)
CREATININE: 1.08 mg/dL — AB (ref 0.44–1.00)
Calcium: 8.5 mg/dL — ABNORMAL LOW (ref 8.9–10.3)
GFR calc Af Amer: >60 mL/min (ref >60)
GFR calc non Af Amer: 52 mL/min — ABNORMAL LOW (ref >60)
Glucose, Bld: 144 mg/dL — ABNORMAL HIGH (ref 65–99)
Potassium: 3.5 mmol/L (ref 3.5–5.1)
SODIUM: 137 mmol/L (ref 135–145)
Total Bilirubin: 0.5 mg/dL (ref 0.3–1.2)
Total Protein: 6.1 g/dL — ABNORMAL LOW (ref 6.5–8.1)

## 2016-05-15 MED ORDER — CYANOCOBALAMIN 1000 MCG/ML IJ SOLN
1000.0000 ug | Freq: Once | INTRAMUSCULAR | Status: AC
  Administered 2016-05-15: 1000 ug via INTRAMUSCULAR
  Filled 2016-05-15: qty 1

## 2016-05-15 MED ORDER — IPRATROPIUM-ALBUTEROL 0.5-2.5 (3) MG/3ML IN SOLN
3.0000 mL | Freq: Four times a day (QID) | RESPIRATORY_TRACT | Status: DC
  Administered 2016-05-15: 3 mL via RESPIRATORY_TRACT
  Filled 2016-05-15: qty 3

## 2016-05-15 MED ORDER — SODIUM CHLORIDE 0.9 % IV SOLN
1596.0000 mg | Freq: Once | INTRAVENOUS | Status: AC
  Administered 2016-05-15: 1596 mg via INTRAVENOUS
  Filled 2016-05-15: qty 42

## 2016-05-15 MED ORDER — HYDROCODONE-ACETAMINOPHEN 5-325 MG PO TABS
2.0000 | ORAL_TABLET | Freq: Once | ORAL | Status: AC
  Administered 2016-05-15: 2 via ORAL
  Filled 2016-05-15: qty 2

## 2016-05-15 MED ORDER — SODIUM CHLORIDE 0.9% FLUSH
10.0000 mL | INTRAVENOUS | Status: DC | PRN
  Administered 2016-05-15: 10 mL
  Filled 2016-05-15: qty 10

## 2016-05-15 MED ORDER — PROCHLORPERAZINE MALEATE 10 MG PO TABS
10.0000 mg | ORAL_TABLET | Freq: Once | ORAL | Status: AC
  Administered 2016-05-15: 10 mg via ORAL
  Filled 2016-05-15: qty 1

## 2016-05-15 MED ORDER — PACLITAXEL PROTEIN-BOUND CHEMO INJECTION 100 MG
125.0000 mg/m2 | Freq: Once | INTRAVENOUS | Status: AC
  Administered 2016-05-15: 200 mg via INTRAVENOUS
  Filled 2016-05-15: qty 40

## 2016-05-15 MED ORDER — HEPARIN SOD (PORK) LOCK FLUSH 100 UNIT/ML IV SOLN
500.0000 [IU] | Freq: Once | INTRAVENOUS | Status: AC | PRN
  Administered 2016-05-15: 500 [IU]
  Filled 2016-05-15: qty 5

## 2016-05-15 MED ORDER — SODIUM CHLORIDE 0.9 % IV SOLN
Freq: Once | INTRAVENOUS | Status: AC
  Administered 2016-05-15: 11:00:00 via INTRAVENOUS

## 2016-05-15 NOTE — Progress Notes (Signed)
Lindsay Salazar, [place] [address]   CLINIC:  Medical Oncology/Hematology  PCP:  Lindsay Salazar [address] [place] [address] [phone]   REASON FOR VISIT:  Follow-up for Stage IB (T2N0M0) adenocarcinoma of pancreas   CURRENT THERAPY: Gemzar/Abraxane on days 1, 8, & 15 every 28 days beginning on 12/19/15.    BRIEF ONCOLOGIC HISTORY:    Pancreatic cancer ([place])   11/29/2015 Procedure    EUS by Dr. Ardis Hughs- 4.2cm mass in the body of pancreas causing main pancreatic duct obstruction (and dilation) and focally involving the portosplenic vein confluence. The mass does not involve the SMA, celiac trunk.      11/30/2015 Pathology Results    FINE NEEDLE ASPIRATION, ENDOSCOPIC, PANCREAS BODY (SPECIMEN 1 OF 1 COLLECTED 11/30/15): MALIGNANT CELLS CONSISTENT WITH ADENOCARCINOMA.      12/18/2015 Procedure    Successful placement of right IJ approach port-a-cath with tip at the superior caval atrial junction by IR      12/19/2015 -  Chemotherapy    The patient had PACLitaxel-protein bound (ABRAXANE) chemo infusion 200 mg, 125 mg/m2 = 200 mg, Intravenous,  Once, 1 of 4 cycles  gemcitabine (GEMZAR) 1,634 mg in sodium chloride 0.9 % 250 mL chemo infusion, 1,000 mg/m2 = 1,634 mg, Intravenous,  Once, 1 of 4 cycles  for chemotherapy treatment.        12/19/2015 Tumor Marker    Patient's tumor was tested for the following markers: CA 19-9. Results of the tumor marker test revealed 197.      12/24/2015 Imaging    CT angio chest- 1. No evidence of pulmonary embolus. 2. Mild scattered bilateral peribronchovascular opacity within the lungs, more nodular at the right middle lobe, likely reflects an atypical infectious process.      01/20/2016 - 01/23/2016 Hospital Admission    Admit date: 01/20/2016 Admission diagnosis: COPD exacerbation Additional comments:       01/28/2016 - 02/07/2016 Hospital Admission    Admit date:  01/28/2016 Admission diagnosis: Atrial fibrillation with rapid ventricular response Additional comments: CHADS vasc score 3, likely secondary to hypoxia, On metoprolol and Cardizem, and on Xarelto for anticoagulation      02/17/2016 - 02/19/2016 Hospital Admission    Admit date: 02/17/2016 Admission diagnosis: .Bright red blood per rectum. Additional comments: Anoscopy performed.  Low volume hematochezia appears to be secondary to hemorrhoidal bleeding. Anoscopy in emergency room did not reveal evidence of gross bleeding. Nevertheless patient needs colonoscopy at some point as she has never undergone this exam in the past. Patient however is not agreeable to undergo colonoscopy during this admission but will consider at a later date.      03/19/2016 Miscellaneous    Patient seen by Dr. Barry Dienes for consideration of distal pancreatectomy.  She is arranged for patient to undergo clearance from a pulmonology and cardiology standpoint.  Once cleared, she will plan on taking patient to surgery.  In the interim, we will continue with neoadjuvant chemotherapy.      03/28/2016 - 04/04/2016 Hospital Admission    Admit date: 03/28/2016 Admission diagnosis: Acute on chronic respiratory failure, COPD exacerbation       04/26/2016 - 05/01/2016 Hospital Admission    Admit date: 04/26/16 Admission diagnosis: Right LE cellulitis       05/08/2016 - 05/12/2016 Hospital Admission    Admit date: 05/08/16 Admission diagnosis: COPD exacerbation        05/15/2016 Treatment Plan Change    Remainder of  cycle #5 (days 8 & 15 of cycle) discontinued given multiple hospitalizations.  Proceed with cycle #6 Gemzar/Abraxane today.         INTERVAL HISTORY:  [name] 66 y.o. female returns for follow-up for pancreatic cancer. Last cycle of chemotherapy was cycle #5 Gemzar/Abraxane, day 1 on 04/16/16. She was unable to complete day 8 or day 15 of that cycle of chemotherapy.   She was recently hospitalized for COPD  exacerbation from 05/08/16-05/12/16. She was discharged home on prednisone taper.   [name] tells me that she has had a hard past few weeks being in and out of the hospital. Her biggest concerns today are right foot/leg pain; "it feels like little knives and scissors stabbing me."  The toes on her right foot are bruised and blue; her right leg is draining at times d/t edema and recent cellulitis/healing wounds.  She is requesting a refill of her pain medications. She generally requires the Norco about 2-3 times/day; last dose was around midnight last night per her report.  Bowels are moving well; generally has 1-2 BMs/day.   Denies fever/chills.  Her appetite is 100%. She feels tired and her energy levels are 25% of her baseline.   She feels short of breath, particularly with exertion.  When she is here at the cancer center, she wears O2 for comfort at times which is helpful.  She is also requesting a "breathing treatment" that she has required for the past several cycles of chemotherapy.   During our conversation, she made the comment, "FBI out to get me, and you're going to think I'm crazy but I'm not crazy." When asked why she thought the FBI was out to get her, she was unable to articulate a response.  States that she has run out of her "lunatic pill" and cannot remember the name of the medication; later she recalled that she takes Lexapro. (We do not have this medication on her med list). She tells me it was prescribed at the mental health department. She is also requesting a refill of Valium; again we have no record of her receiving Valium and she states, "Well, I haven't taken it in a really long time. I just can't take Xanax because it makes me too sleepy."     REVIEW OF SYSTEMS:  Review of Systems - Oncology, per HPI.    PAST MEDICAL/SURGICAL HISTORY:  Past Medical History:  Diagnosis Date  . Anemia of chronic disease   . Arthritis    bursitis , fibromyalgia  . Arthritis    "right hip"  (11/27/2015)  . Asthma   . Chronic bronchitis ([place])   . Chronic respiratory failure ([place])   . Chronic right hip pain   . COPD (chronic obstructive pulmonary disease) ([place])    wears home o2 prn  . Daily headache    "last 2-3 months" (11/27/2015)  . DVT (deep venous thrombosis) ([place]) 03/2016  . Exposure to TB    "mom had it when she was pregnant with me"  . Fibromyalgia   . GERD (gastroesophageal reflux disease)    use to have it but not now  . Heart murmur    77-  28 years old  . Hyperkalemia   . Hypertension   . On home oxygen therapy    11/27/2015 "whenever I need it; don't know how many liters"  . PAF (paroxysmal atrial fibrillation) ([place])   . Pancreatic cancer ([place])   . Paroxysmal atrial flutter ([place])   . Pneumonia    "  several times" (11/27/2015)  . Pneumothorax    Past Surgical History:  Procedure Laterality Date  . ANTERIOR CERVICAL DECOMP/DISCECTOMY FUSION     "put 4 screws in"  . BACK SURGERY    . CARDIAC CATHETERIZATION  08/2004   Archie Endo 06/18/2010  . CARDIOVERSION N/A 07/18/2015   Procedure: CARDIOVERSION;  Surgeon: Josue Hector, MD;  Location[facility];  Service: Cardiovascular;  Laterality: N/A;  . ESOPHAGOGASTRODUODENOSCOPY N/A 08/09/2015   Procedure: ESOPHAGOGASTRODUODENOSCOPY (EGD);  Surgeon: Rogene Houston, MD;  Location: [facility];  Service: Endoscopy;  Laterality: N/A;  . EUS N/A 11/29/2015   Procedure: UPPER ENDOSCOPIC ULTRASOUND (EUS) RADIAL;  Surgeon: Milus Banister, MD;  Location: [facility];  Service: Endoscopy;  Laterality: N/A;  . IR GENERIC HISTORICAL  12/18/2015   IR US GUIDE VASC ACCESS RIGHT 12/18/2015 Sandi Mariscal, MD [facility]  . IR GENERIC HISTORICAL  12/18/2015   IR FLUORO GUIDE PORT INSERTION RIGHT 12/18/2015 Sandi Mariscal, MD [facility]  . TONSILLECTOMY    . TUBAL LIGATION       SOCIAL HISTORY:  Social History   Social History  . Marital status: Divorced    Spouse name: N/A  . Number of children: N/A  . Years of  education: N/A   Occupational History  . retired    Social History Main Topics  . Smoking status: Former Smoker    Packs/day: 0.50    Years: 46.00    Types: Cigarettes    Start date: 10/09/1969    Quit date: 11/29/2015  . Smokeless tobacco: Never Used  . Alcohol use No     Comment: quit 30 years ago  . Drug use: No  . Sexual activity: Not Currently    Birth control/ protection: Surgical, Post-menopausal   Other Topics Concern  . Not on file   Social History Narrative  . No narrative on file    FAMILY HISTORY:  Family History  Problem Relation Age of Onset  . COPD Mother   . Diabetes Father   . Stroke Father     CURRENT MEDICATIONS:  Outpatient Encounter Prescriptions as of 05/15/2016  Medication Sig  . COMBIVENT RESPIMAT 20-100 MCG/ACT AERS respimat Inhale 1 puff into the lungs 2 (two) times daily.  Marland Kitchen diltiazem (CARDIZEM CD) 360 MG 24 hr capsule Take 1 capsule (360 mg total) by mouth daily.  Marland Kitchen doxycycline (VIBRA-TABS) 100 MG tablet   . gabapentin (NEURONTIN) 100 MG capsule Take 1 capsule (100 mg total) by mouth 3 (three) times daily.  Marland Kitchen guaiFENesin (MUCINEX) 600 MG 12 hr tablet Take 2 tablets (1,200 mg total) by mouth 2 (two) times daily.  Marland Kitchen HYDROcodone-acetaminophen (NORCO) 10-325 MG tablet Take 1 tablet by mouth every 6 (six) hours as needed.  Marland Kitchen ipratropium-albuterol (DUONEB) 0.5-2.5 (3) MG/3ML SOLN Inhale 3 mLs into the lungs every 6 (six) hours as needed (for shortness of breath).   . metoprolol tartrate (LOPRESSOR) 25 MG tablet   . metoprolol tartrate (LOPRESSOR) 50 MG tablet Take 1 tablet (50 mg total) by mouth 2 (two) times daily.  . ondansetron (ZOFRAN) 8 MG tablet Take 1 tablet by mouth daily as needed for nausea/vomiting.  . OXYGEN Inhale 2 L into the lungs daily as needed (for breathing).  Marland Kitchen PACLitaxel Protein-Bound Part (ABRAXANE IV) Inject into the vein. Day 1, day 8, day 15, every 28 days  . pantoprazole (PROTONIX) 40 MG tablet Take 1 tablet (40 mg total)  by mouth daily.  . potassium chloride 20 MEQ/15ML (  10%) SOLN Take 45 mLs (60 mEq total) by mouth daily.  . predniSONE (DELTASONE) 10 MG tablet Take 1 tablet (10 mg total) by mouth daily with breakfast. Take 6 tablets today and then decrease by 1 tablet daily until none are left.  . prochlorperazine (COMPAZINE) 10 MG tablet Take 1 tablet by mouth daily as needed for nausea/vomiting.  . Rivaroxaban (XARELTO) 15 MG TABS tablet Take 1 tablet (15 mg total) by mouth daily with supper.  . SYMBICORT 160-4.5 MCG/ACT inhaler Inhale 1 puff into the lungs 2 (two) times daily.   No facility-administered encounter medications on file as of 05/15/2016.     ALLERGIES:  Allergies  Allergen Reactions  . Ibuprofen Nausea And Vomiting  . Other     Seafood   . Penicillins Swelling    Has patient had a PCN reaction causing immediate rash, facial/tongue/throat swelling, SOB or lightheadedness with hypotension: Yes Has patient had a PCN reaction causing severe rash involving mucus membranes or skin necrosis: No Has patient had a PCN reaction that required hospitalization No Has patient had a PCN reaction occurring within the last 10 years: No If all of the above answers are "NO", then may proceed with Cephalosporin use.   . Tiotropium Bromide Monohydrate Itching  . Tramadol Nausea And Vomiting     PHYSICAL EXAM:  ECOG Performance status: 1-2 - Symptomatic, but largely independent.   Vitals:   05/15/16 1320  BP: (!) 115/91  Pulse: 61  Resp: 20  Temp: 98 F (36.7 C)   There were no vitals filed for this visit.  Physical Exam  Constitutional: She is oriented to person, place, and time. No distress.  Chronically-ill appearing female in no distress.   HENT:  Head: Normocephalic.  Poor dentition  Eyes: Conjunctivae are normal. No scleral icterus.  Neck: Normal range of motion. Neck supple.  Cardiovascular: Normal rate and regular rhythm.   Pulmonary/Chest: Effort normal.  Course rhonchi noted  throughout lung fields; expiratory wheezes noted as well.   Abdominal: Soft. Bowel sounds are normal. There is no tenderness.  Musculoskeletal: Normal range of motion. She exhibits edema (RLE leg/ankle/foot edema; several skin tears noted to bilateral legs in various stages of healing. Top of right foot with healing ulceration/skin tear with dry crusting. ).  Circular bruises to proximal and medial joint areas on right foot; both feet cool to touch; capillary refill present to nailbeds.   Lymphadenopathy:    She has no cervical adenopathy.  Neurological: She is alert and oriented to person, place, and time.  Able to tell me her full name, day of the week, month, and year; able to tell me that we are at cancer center in [place] and the President of the [place].  -Paranoia noted; made comment "FBI out to get me, and you're going to think I'm crazy but I'm not crazy."   Skin: Skin is warm and dry. There is pallor.  Diffuse, generalized bruising to extremities noted   Psychiatric: Mood and affect normal.  Nursing note and vitals reviewed.    LABORATORY DATA:  I have reviewed the labs as listed.  CBC    Component Value Date/Time   WBC 16.8 (H) 05/15/2016 0919   RBC 3.69 (L) 05/15/2016 0919   HGB 11.4 (L) 05/15/2016 0919   HCT 36.3 05/15/2016 0919   PLT 271 05/15/2016 0919   MCV 98.4 05/15/2016 0919   MCH 30.9 05/15/2016 0919   MCHC 31.4 05/15/2016 0919   RDW 16.8 (H) 05/15/2016 0919  LYMPHSABS 0.6 (L) 05/15/2016 0919   MONOABS 1.7 (H) 05/15/2016 0919   EOSABS 0.0 05/15/2016 0919   BASOSABS 0.0 05/15/2016 0919   CMP Latest Ref Rng & Units 05/15/2016 05/09/2016 05/08/2016  Glucose 65 - 99 mg/dL 144(H) 166(H) 104(H)  BUN 6 - 20 mg/dL 33(H) 12 10  Creatinine 0.44 - 1.00 mg/dL 1.08(H) 1.03(H) 0.76  Sodium 135 - 145 mmol/L 137 138 140  Potassium 3.5 - 5.1 mmol/L 3.5 3.4(L) 3.2(L)  Chloride 101 - 111 mmol/L 97(L) 99(L) 101  CO2 22 - 32 mmol/L 29 28 31   Calcium 8.9 - 10.3 mg/dL 8.5(L) 8.7(L)  8.8(L)  Total Protein 6.5 - 8.1 g/dL 6.1(L) 6.5 6.3(L)  Total Bilirubin 0.3 - 1.2 mg/dL 0.5 0.6 0.4  Alkaline Phos 38 - 126 U/L 79 92 91  AST 15 - 41 U/L 37 22 21  ALT 14 - 54 U/L 64(H) 18 17     PENDING LABS:  CA 19-9 pending.    DIAGNOSTIC IMAGING:  MRI abd: 03/12/16      PATHOLOGY:  Pancreas FNA: 11/29/15      ASSESSMENT & PLAN:   Stage IB (T2N0M0) adenocarcinoma of pancreas: -CA 19-9 elevated at time of diagnosis; there has been steady decline in tumor markers since initiating chemotherapy, with the exception of CA 19-9 on 04/16/16 which was 63 (up from 33 on 03/26/16).  CA 19-9 for today pending. We will continue to monitor.  -Last MRI abdomen 03/12/16 showed reduction in size of pancreatic mass, without evidence of metastatic disease.  -Current neoadjuvant chemotherapy with Gemzar/Abraxane.  She only received day 1 of cycle #5 because days 8 & 15 were deferred given frequent hospitalizations/infections.  -Labs reviewed today and are adequate for cycle #6 chemotherapy.  -The goal is to get the patient to surgery with Dr. Stark Klein as soon as she is medically cleared.  Referral back to cardiology and pulmonology (she is already an established patient with [facility] office and Dr. Luan Pulling' office).  We will continue chemotherapy in the interim.  -Return as scheduled for chemotherapy.  -Follow-up visit in 1 month prior to next cycle of chemotherapy. She will likely need restaging MRI after next cycle chemotherapy, or sooner if clinically indicated.   ? Mental status changes:  -While patient is alert & oriented x 4 during our visit, there are concerns for unprovoked paranoia. Previously her mental illness was being managed by the local mental health department.  She states that she has been on Lexapro ("my lunatic pill") in the past and has run out.  We do not have record of her being on Lexapro and she does not know the dosage of medication.  She also requested a refill of  her Valium, "but I hadn't taken any in years."  We also do not have this medication on her med list.  I am reluctant to prescribe any psychotropic drugs since she is unsure of her current medications/concerns for adverse effects.  Recommended she follow-up with her mental health team for refills, as directed.   -We will continue to monitor her mental status. Certainly hypoxia can play a role in mental status changes and she does have significant shortness of breath with exertion.  O2 sats 100% today.    Shortness of breath:  -Given duoneb during chemotherapy treatment today, which was helpful. She generally requires a breathing treatment with every chemotherapy treatment.   (R) leg/foot skin tear and bruising:  -Bruising noted to proximal and medial phalanges. There is edema noted  to RLE, likely due to known DVT and cellulitis.  Patient also with well-known poor hygiene and did not change her socks for several days; bruising appears to be sock creases from dirty/stiffened socks. On exam, both feet are mildly cool to touch; there is perfusion to nail beds bilaterally.  Assessing pedal pulses in RLE is difficult given edema, but there appears to be adequate perfusion. -Stressed the importance of regular hygiene to help improve/heal these wounds. Her living situation is challenging because she states her "landlord hasn't fixed the showerhead, but I go shower at his daughter's house."  Encouraged her to change her socks/clothes often and keep the wounds as clean as possible.     RLE DVT and cellulitis:  -Continue Xarelto.   Pain:  -Patient requesting refill of Norco today. [place] Controlled Substance Registry reviewed; recent refill of Norco noted on 05/05/16 and should be 2-month's supply. Therefore, she is not due for refill until 06/04/16.  -She was given 2 tabs Norco today during her chemo infusion for pain.     Dispo:  -Return as directed for chemotherapy.  -Return to cancer center for follow-up visit  in 1 month.  -Refer to pulmonology and cardiology to obtain surgery clearance exams.    All questions were answered to patient's stated satisfaction. Encouraged patient to call with any new concerns or questions before her next visit to the cancer center and we can certain see her sooner, if needed.    Plan of care discussed with Dr. Talbert Cage, who agrees with the above aforementioned.     Orders placed this encounter:  No orders of the defined types were placed in this encounter.     Mike Craze, NP [place] [phone]

## 2016-05-15 NOTE — Patient Instructions (Addendum)
[place] at [facility] Discharge Instructions  RECOMMENDATIONS MADE BY THE CONSULTANT AND ANY TEST RESULTS WILL BE SENT TO YOUR REFERRING PHYSICIAN.  You were seen today by Mike Craze NP. Chemo today. Being referred to Cardiology and pulmonology for surgery clearance.  Return for Day 8 and Day 15 of Chemo. Follow up on Day 1 of next cycle.   Thank you for choosing [place] at [facility] to provide your oncology and hematology care.  To afford each patient quality time with our provider, please arrive at least 15 minutes before your scheduled appointment time.    If you have a lab appointment with the [facility] please come in thru the  Main Entrance and check in at the main information desk  You need to re-schedule your appointment should you arrive 10 or more minutes late.  We strive to give you quality time with our providers, and arriving late affects you and other patients whose appointments are after yours.  Also, if you no show three or more times for appointments you may be dismissed from the clinic at the providers discretion.     Again, thank you for choosing [facility].  Our hope is that these requests will decrease the amount of time that you wait before being seen by our physicians.       _____________________________________________________________  Should you have questions after your visit to [facility], please contact our office at [phone] between the hours of 8:30 a.m. and 4:30 p.m.  Voicemails left after 4:30 p.m. will not be returned until the following business day.  For prescription refill requests, have your pharmacy contact our office.       Resources For Cancer Patients and their Caregivers ? American Cancer Society: Can assist with transportation, wigs, general needs, runs Look Good Feel Better.        216-526-9453 ? Cancer Care: Provides financial assistance,  online support groups, medication/co-pay assistance.  1-800-813-HOPE 952-023-0383) ? [place] Assists [place] cancer patients and their families through emotional , educational and financial support.  (724)184-4175 ? [place] DSS Where to apply for food stamps, Medicaid and utility assistance. 636-523-3246 ? RC[facility]: Transportation to medical appointments. 503-811-9778 ? Social Security Administration: May apply for disability if have a Stage IV cancer. 903-637-3146 (828)757-8968 ? [employer], Disability and Transit Services: Assists with nutrition, care and transit needs. [place] Support Programs: @10RELATIVEDAYS @ > Cancer Support Group  2nd Tuesday of the month 1pm-2pm, Journey Room  > Creative Journey  3rd Tuesday of the month 1130am-1pm, Journey Room  > Look Good Feel Better  1st Wednesday of the month 10am-12 noon, Journey Room (Call [place] to register [phone])

## 2016-05-15 NOTE — Patient Instructions (Signed)
[facility] Discharge Instructions for Patients Receiving Chemotherapy   Beginning January 23rd 2017 lab work for the [facility] will be done in the  Main lab at [facility] on 1st floor. If you have a lab appointment with the [facility] please come in thru the  Main Entrance and check in at the main information desk   Today you received the following chemotherapy agents: Abraxane and Gemzar.     If you develop nausea and vomiting, or diarrhea that is not controlled by your medication, call the clinic.  The clinic phone number is [phone]. Office hours are Monday-Friday 8:30am-5:00pm.  BELOW ARE SYMPTOMS THAT SHOULD BE REPORTED IMMEDIATELY:  *FEVER GREATER THAN 101.0 F  *CHILLS WITH OR WITHOUT FEVER  NAUSEA AND VOMITING THAT IS NOT CONTROLLED WITH YOUR NAUSEA MEDICATION  *UNUSUAL SHORTNESS OF BREATH  *UNUSUAL BRUISING OR BLEEDING  TENDERNESS IN MOUTH AND THROAT WITH OR WITHOUT PRESENCE OF ULCERS  *URINARY PROBLEMS  *BOWEL PROBLEMS  UNUSUAL RASH Items with * indicate a potential emergency and should be followed up as soon as possible. If you have an emergency after office hours please contact your primary care physician or go to the nearest emergency department.  Please call the clinic during office hours if you have any questions or concerns.   You may also contact the Patient Navigator at [phone] should you have any questions or need assistance in obtaining follow up care.      Resources For Cancer Patients and their Caregivers ? American Cancer Society: Can assist with transportation, wigs, general needs, runs Look Good Feel Better.        386-085-7636 ? Cancer Care: Provides financial assistance, online support groups, medication/co-pay assistance.  1-800-813-HOPE (816)672-2100) ? [place] Assists [place] cancer patients and their families through emotional , educational and financial support.   (586) 213-2993 ? [place] DSS Where to apply for food stamps, Medicaid and utility assistance. 9037129705 ? RCATS: Transportation to medical appointments. 220-735-4834 ? Social Security Administration: May apply for disability if have a Stage IV cancer. 859-577-9207 680 089 3381 ? [employer], Disability and Transit Services: Assists with nutrition, care and transit needs. (509) 876-2168

## 2016-05-15 NOTE — Progress Notes (Signed)
Patient has generalized bruising to BUE from venipunctures from recent hospitalization.  Right foot and ankle swollen with 4+ pitting edema, recently was treated for cellulitis, however she currently has finished her antibiotic regimen.  Left ankle had obvious impression from cuff of sock when removed and 2+ pitting edema noted from ankle down on left leg.  Right foot has multiple blue toes, and the foot itself is red and tender.  NP to see the patient today and to assess her condition as noted above.  Patient admitted to not having removed her sock since Monday and she was strongly encouraged multiple times as well as educated on removing her sock daily and washing her foot.  She verbalized understanding.  Labs printed and reviewed with Dr. Talbert Cage and Elzie Rings, NP; instructed to treat.  Patient tolerated infusion well.  VSS and patient in stable condition, wheeled out by boyfriend upon discharge from clinic.

## 2016-05-16 ENCOUNTER — Encounter (HOSPITAL_COMMUNITY): Payer: Self-pay | Admitting: Adult Health

## 2016-05-16 LAB — CANCER ANTIGEN 19-9: CA 19 9: 90 U/mL — AB (ref 0–35)

## 2016-05-16 NOTE — Progress Notes (Unsigned)
Faxed pt records to Dr Sinda Du for referral.

## 2016-05-21 ENCOUNTER — Ambulatory Visit (HOSPITAL_COMMUNITY): Payer: Medicare HMO

## 2016-05-22 ENCOUNTER — Telehealth (HOSPITAL_COMMUNITY): Payer: Self-pay | Admitting: *Deleted

## 2016-05-22 ENCOUNTER — Ambulatory Visit (HOSPITAL_COMMUNITY): Payer: Medicare HMO

## 2016-05-22 ENCOUNTER — Other Ambulatory Visit (HOSPITAL_COMMUNITY): Payer: Self-pay | Admitting: Adult Health

## 2016-05-22 NOTE — Telephone Encounter (Signed)
Pt has not arrived for scheduled 1045 chemotherapy appt.  Voicemail messages left on both of the numbers re:  Missed appt and instructed pt to call the clinic to reschedule.

## 2016-05-23 ENCOUNTER — Encounter: Payer: Self-pay | Admitting: Adult Health

## 2016-05-23 ENCOUNTER — Ambulatory Visit (INDEPENDENT_AMBULATORY_CARE_PROVIDER_SITE_OTHER): Payer: Medicare HMO | Admitting: Adult Health

## 2016-05-23 VITALS — BP 112/70 | HR 68 | Ht 64.0 in | Wt 120.0 lb

## 2016-05-23 DIAGNOSIS — Z01810 Encounter for preprocedural cardiovascular examination: Secondary | ICD-10-CM | POA: Diagnosis not present

## 2016-05-23 DIAGNOSIS — Z01818 Encounter for other preprocedural examination: Secondary | ICD-10-CM | POA: Diagnosis not present

## 2016-05-23 DIAGNOSIS — I4891 Unspecified atrial fibrillation: Secondary | ICD-10-CM

## 2016-05-23 DIAGNOSIS — I10 Essential (primary) hypertension: Secondary | ICD-10-CM

## 2016-05-23 MED ORDER — DILTIAZEM HCL ER COATED BEADS 240 MG PO CP24: 240.0000 mg | ORAL_CAPSULE | Freq: Every day | ORAL | 3 refills | Status: AC

## 2016-05-23 NOTE — Progress Notes (Signed)
Cardiology Office Note   Date:  05/23/2016   ID:  Lindsay Salazar, DOB 03/12/1950, MRN [id number]  PCP:  [name], PA-C  Cardiologist:  Cloria Spring, NP   Chief Complaint  Patient presents with  . Atrial Fibrillation      History of Present Illness: Lindsay Salazar is a 66 y.o. female who presents for ongoing assessment and management of atrial fibrillation, hypertension, with other history to include DVT, on Xarelto, COPD and pancreatic cancer. When last seen in the office on 03/28/2016 she was having worsening symptoms of dyspnea, had run out of her inhalers. She was sent to the emergency room for further treatment she was provided with inhalers and released.  Unfortunately, the patient was admitted to the hospital on 05/09/2016, with diagnosis of chest pain, productive green colored phlegm, cellulitis of the right lower extremity and worsening dyspnea. She was treated with one dose of IV Lasix and started on inhaler and prednisone. The patient was diagnosed with COPD exacerbation.   She was to follow-up with surgery for adenocarcinoma of the pancreas and is to have resection. She is here for post hospital follow-up and surgical clearance. She was seen by [facility] on 05/15/2016, continued on chemotherapy. She was noted to have some anxiety and statements revealing paranoia per office visit notes.    Past Medical History:  Diagnosis Date  . Anemia of chronic disease   . Arthritis    bursitis , fibromyalgia  . Arthritis    "right hip" (11/27/2015)  . Asthma   . Chronic bronchitis ([place])   . Chronic respiratory failure ([place])   . Chronic right hip pain   . COPD (chronic obstructive pulmonary disease) ([place])    wears home o2 prn  . Daily headache    "last 2-3 months" (11/27/2015)  . DVT (deep venous thrombosis) ([name]) 03/2016  . Exposure to TB    "mom had it when she was pregnant with me"  . Fibromyalgia   . GERD (gastroesophageal reflux disease)    use  to have it but not now  . Heart murmur    74-  54 years old  . Hyperkalemia   . Hypertension   . On home oxygen therapy    11/27/2015 "whenever I need it; don't know how many liters"  . PAF (paroxysmal atrial fibrillation) ([place])   . Pancreatic cancer ([place])   . Paroxysmal atrial flutter ([place])   . Pneumonia    "several times" (11/27/2015)  . Pneumothorax     Past Surgical History:  Procedure Laterality Date  . ANTERIOR CERVICAL DECOMP/DISCECTOMY FUSION     "put 4 screws in"  . BACK SURGERY    . CARDIAC CATHETERIZATION  08/2004   Archie Endo 06/18/2010  . CARDIOVERSION N/A 07/18/2015   Procedure: CARDIOVERSION;  Surgeon: Josue Hector, MD;  Location: [facility];  Service: Cardiovascular;  Laterality: N/A;  . ESOPHAGOGASTRODUODENOSCOPY N/A 08/09/2015   Procedure: ESOPHAGOGASTRODUODENOSCOPY (EGD);  Surgeon: Rogene Houston, MD;  Location: [facility];  Service: Endoscopy;  Laterality: N/A;  . EUS N/A 11/29/2015   Procedure: UPPER ENDOSCOPIC ULTRASOUND (EUS) RADIAL;  Surgeon: Milus Banister, MD;  Location: [facility];  Service: Endoscopy;  Laterality: N/A;  . IR GENERIC HISTORICAL  12/18/2015   IR US GUIDE VASC ACCESS RIGHT 12/18/2015 Sandi Mariscal, MD [facility]  . IR GENERIC HISTORICAL  12/18/2015   IR FLUORO GUIDE PORT INSERTION RIGHT 12/18/2015 Sandi Mariscal, MD [facility]  . TONSILLECTOMY    .  TUBAL LIGATION       Current Outpatient Prescriptions  Medication Sig Dispense Refill  . COMBIVENT RESPIMAT 20-100 MCG/ACT AERS respimat Inhale 1 puff into the lungs 2 (two) times daily. 1 Inhaler 5  . doxycycline (VIBRA-TABS) 100 MG tablet     . gabapentin (NEURONTIN) 100 MG capsule Take 1 capsule (100 mg total) by mouth 3 (three) times daily. 90 capsule 0  . guaiFENesin (MUCINEX) 600 MG 12 hr tablet Take 2 tablets (1,200 mg total) by mouth 2 (two) times daily. 60 tablet 0  . HYDROcodone-acetaminophen (NORCO) 10-325 MG tablet Take 1 tablet by mouth every 6 (six) hours as needed. 60  tablet 0  . ipratropium-albuterol (DUONEB) 0.5-2.5 (3) MG/3ML SOLN Inhale 3 mLs into the lungs every 6 (six) hours as needed (for shortness of breath).     . metoprolol tartrate (LOPRESSOR) 25 MG tablet     . metoprolol tartrate (LOPRESSOR) 50 MG tablet Take 1 tablet (50 mg total) by mouth 2 (two) times daily. 60 tablet 0  . ondansetron (ZOFRAN) 8 MG tablet Take 1 tablet by mouth daily as needed for nausea/vomiting.    . OXYGEN Inhale 2 L into the lungs daily as needed (for breathing).    Marland Kitchen PACLitaxel Protein-Bound Part (ABRAXANE IV) Inject into the vein. Day 1, day 8, day 15, every 28 days    . pantoprazole (PROTONIX) 40 MG tablet Take 1 tablet (40 mg total) by mouth daily. 60 tablet 3  . potassium chloride 20 MEQ/15ML (10%) SOLN Take 45 mLs (60 mEq total) by mouth daily. 450 mL 3  . predniSONE (DELTASONE) 10 MG tablet Take 1 tablet (10 mg total) by mouth daily with breakfast. Take 6 tablets today and then decrease by 1 tablet daily until none are left. 21 tablet 0  . prochlorperazine (COMPAZINE) 10 MG tablet Take 1 tablet by mouth daily as needed for nausea/vomiting.    . Rivaroxaban (XARELTO) 15 MG TABS tablet Take 1 tablet (15 mg total) by mouth daily with supper. 30 tablet 0  . SYMBICORT 160-4.5 MCG/ACT inhaler Inhale 1 puff into the lungs 2 (two) times daily. 1 Inhaler 12  . diltiazem (CARDIZEM CD) 240 MG 24 hr capsule Take 1 capsule (240 mg total) by mouth daily. 90 capsule 3   No current facility-administered medications for this visit.     Allergies:   Ibuprofen; Other; Penicillins; Tiotropium bromide monohydrate; and Tramadol    Social History:  The patient  reports that she quit smoking about 5 months ago. Her smoking use included Cigarettes. She started smoking about 46 years ago. She has a 23.00 pack-year smoking history. She has never used smokeless tobacco. She reports that she does not drink alcohol or use drugs.   Family History:  The patient's family history includes COPD in  her mother; Diabetes in her father; Stroke in her father.    ROS: All other systems are reviewed and negative. Unless otherwise mentioned in H&P    PHYSICAL EXAM: VS:  BP 112/70   Pulse 68   Ht 5\' 4"  (1.626 m)   Wt 120 lb (54.4 kg)   SpO2 94%   BMI 20.60 kg/m  , BMI Body mass index is 20.6 kg/m. GEN: Well nourished, well developed, in no acute distress  HEENT: normal Alopecia  Neck: no JVD, carotid bruits, or masses Cardiac: IRRR; no murmurs, rubs, or gallops,no edema  Respiratory:  Inspiratory crackles.  GI: soft, nontender, nondistended, + BS MS: no deformity or atrophy Generalized deconditioning  Skin: warm and dry, no rash Neuro:  Strength and sensation are intact Psych: euthymic mood, full affect  EKG: Atrial fibrillation, HR 86 bpm, with non-specific T-wave depression lateral leads.   Recent Labs: 06/27/2015: TSH 2.678 04/04/2016: Magnesium 1.6 05/08/2016: B Natriuretic Peptide 711.0 05/15/2016: ALT 64; BUN 33; Creatinine, Ser 1.08; Hemoglobin 11.4; Platelets 271; Potassium 3.5; Sodium 137    Lipid Panel No results found for: CHOL, TRIG, HDL, CHOLHDL, VLDL, LDLCALC, LDLDIRECT    Wt Readings from Last 3 Encounters:  05/23/16 120 lb (54.4 kg)  05/09/16 131 lb 11.2 oz (59.7 kg)  05/02/16 128 lb (58.1 kg)      Other studies Reviewed: CONCLUSIONS:  1.  No significant coronary artery disease.  2.  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  3.  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  4.  Systemic hypertension.  5.  Elevated left ventricular end-diastolic pressure.  Echocardiogram 06/28/2015 Left ventricle: The cavity size was normal. Wall thickness was   normal. Systolic function was normal. The estimated ejection   fraction was 55%. There is akinesis of the midanteroseptal   myocardium. The study is not technically sufficient to allow   evaluation of LV diastolic function. - Aortic valve: Mildly calcified  annulus. Trileaflet. There was   trivial regurgitation. - Mitral valve: Mildly thickened leaflets . There was mild   regurgitation. - Left atrium: The atrium was mildly dilated. - Right ventricle: Systolic function was low normal. - Right atrium: The atrium was mildly dilated. Central venous   pressure (est): 8 mm Hg. - Tricuspid valve: There was moderate regurgitation. - Pulmonary arteries: PA peak pressure: 41 mm Hg (S). - Pericardium, extracardiac: There was no pericardial effusion.  ASSESSMENT AND PLAN:  1. Chronic Atrial fibrillation: She had been out of diltiazem for 2 days. HR and BP are still controlled. Will decrease diltiazem to 240 mg daily. Continue Xarelto 15 mg. If surgery is planned, will need to hold Xarelto for two days prior to surgery and also the day of surgery. Restart ASAP at discretion of surgeon.  Will repeat echo for evaluation of LV function.   2. Pre-Operative Cardiac Clearance:Once echo has been reviewed will send letter to Dr. Genella Mech of [facility]. Last cardiac cath in 08/19/2004, did not reveal any significant CAD. CT of chest on 05/08/2016 did not reveal coronary artery calcifications.  At this time she is of moderate but acceptable risk to undergo surgical resection of pancreas in the setting of cancer. No ischemic testing is planned.   3. COPD: Significant. Followed by Dr. Luan Pulling.   Current medicines are reviewed at length with the patient today.    Labs/ tests ordered today include:   Orders Placed This Encounter  Procedures  . ECHOCARDIOGRAM COMPLETE     Disposition:   FU with post surgery   Signed, Jory Sims, NP  05/23/2016 4:30 PM    [facility] [address] [place], [place] [address] Phone: [phone]; Fax: [phone]

## 2016-05-23 NOTE — Patient Instructions (Signed)
Your physician recommends that you schedule a follow-up appointment in: to be determined    Your physician has requested that you have an echocardiogram. Echocardiography is a painless test that uses sound waves to create images of your heart. It provides your doctor with information about the size and shape of your heart and how well your heart's chambers and valves are working. This procedure takes approximately one hour. There are no restrictions for this procedure.     DECREASE Diltiazem to 240 mg daily     Thank you for choosing [facility] !

## 2016-05-26 ENCOUNTER — Encounter (HOSPITAL_COMMUNITY): Payer: Self-pay | Admitting: *Deleted

## 2016-05-26 ENCOUNTER — Inpatient Hospital Stay (HOSPITAL_COMMUNITY)
Admission: EM | Admit: 2016-05-26 | Discharge: 2016-06-02 | DRG: 191 | Disposition: A | Discharge status: Home / Self Care | Payer: Medicare HMO | Attending: Internal Medicine | Admitting: Internal Medicine

## 2016-05-26 ENCOUNTER — Emergency Department (HOSPITAL_COMMUNITY): Payer: Medicare HMO

## 2016-05-26 ENCOUNTER — Other Ambulatory Visit: Payer: Self-pay

## 2016-05-26 DIAGNOSIS — J440 Chronic obstructive pulmonary disease with acute lower respiratory infection: Secondary | ICD-10-CM | POA: Diagnosis present

## 2016-05-26 DIAGNOSIS — D638 Anemia in other chronic diseases classified elsewhere: Secondary | ICD-10-CM | POA: Diagnosis present

## 2016-05-26 DIAGNOSIS — Z7951 Long term (current) use of inhaled steroids: Secondary | ICD-10-CM

## 2016-05-26 DIAGNOSIS — I4891 Unspecified atrial fibrillation: Secondary | ICD-10-CM | POA: Diagnosis present

## 2016-05-26 DIAGNOSIS — E875 Hyperkalemia: Secondary | ICD-10-CM | POA: Diagnosis not present

## 2016-05-26 DIAGNOSIS — J9611 Chronic respiratory failure with hypoxia: Secondary | ICD-10-CM | POA: Diagnosis present

## 2016-05-26 DIAGNOSIS — Z201 Contact with and (suspected) exposure to tuberculosis: Secondary | ICD-10-CM | POA: Diagnosis present

## 2016-05-26 DIAGNOSIS — Z9981 Dependence on supplemental oxygen: Secondary | ICD-10-CM | POA: Diagnosis not present

## 2016-05-26 DIAGNOSIS — Z8507 Personal history of malignant neoplasm of pancreas: Secondary | ICD-10-CM

## 2016-05-26 DIAGNOSIS — N179 Acute kidney failure, unspecified: Secondary | ICD-10-CM | POA: Diagnosis present

## 2016-05-26 DIAGNOSIS — Z981 Arthrodesis status: Secondary | ICD-10-CM | POA: Diagnosis not present

## 2016-05-26 DIAGNOSIS — Z86718 Personal history of other venous thrombosis and embolism: Secondary | ICD-10-CM

## 2016-05-26 DIAGNOSIS — J441 Chronic obstructive pulmonary disease with (acute) exacerbation: Secondary | ICD-10-CM | POA: Diagnosis present

## 2016-05-26 DIAGNOSIS — Z79899 Other long term (current) drug therapy: Secondary | ICD-10-CM | POA: Diagnosis not present

## 2016-05-26 DIAGNOSIS — J209 Acute bronchitis, unspecified: Secondary | ICD-10-CM | POA: Diagnosis present

## 2016-05-26 DIAGNOSIS — Z886 Allergy status to analgesic agent status: Secondary | ICD-10-CM

## 2016-05-26 DIAGNOSIS — Z825 Family history of asthma and other chronic lower respiratory diseases: Secondary | ICD-10-CM | POA: Diagnosis not present

## 2016-05-26 DIAGNOSIS — E871 Hypo-osmolality and hyponatremia: Secondary | ICD-10-CM | POA: Diagnosis present

## 2016-05-26 DIAGNOSIS — Z9221 Personal history of antineoplastic chemotherapy: Secondary | ICD-10-CM

## 2016-05-26 DIAGNOSIS — L03115 Cellulitis of right lower limb: Secondary | ICD-10-CM | POA: Diagnosis not present

## 2016-05-26 DIAGNOSIS — Z87891 Personal history of nicotine dependence: Secondary | ICD-10-CM

## 2016-05-26 DIAGNOSIS — I10 Essential (primary) hypertension: Secondary | ICD-10-CM | POA: Diagnosis present

## 2016-05-26 DIAGNOSIS — M797 Fibromyalgia: Secondary | ICD-10-CM | POA: Diagnosis present

## 2016-05-26 DIAGNOSIS — C254 Malignant neoplasm of endocrine pancreas: Secondary | ICD-10-CM

## 2016-05-26 DIAGNOSIS — K219 Gastro-esophageal reflux disease without esophagitis: Secondary | ICD-10-CM | POA: Diagnosis present

## 2016-05-26 DIAGNOSIS — Z880 Allergy status to penicillin: Secondary | ICD-10-CM

## 2016-05-26 DIAGNOSIS — L03116 Cellulitis of left lower limb: Secondary | ICD-10-CM | POA: Diagnosis present

## 2016-05-26 DIAGNOSIS — M7989 Other specified soft tissue disorders: Secondary | ICD-10-CM | POA: Diagnosis not present

## 2016-05-26 DIAGNOSIS — C259 Malignant neoplasm of pancreas, unspecified: Secondary | ICD-10-CM | POA: Diagnosis present

## 2016-05-26 DIAGNOSIS — R0600 Dyspnea, unspecified: Secondary | ICD-10-CM | POA: Diagnosis not present

## 2016-05-26 DIAGNOSIS — D649 Anemia, unspecified: Secondary | ICD-10-CM

## 2016-05-26 DIAGNOSIS — I480 Paroxysmal atrial fibrillation: Secondary | ICD-10-CM | POA: Diagnosis present

## 2016-05-26 DIAGNOSIS — Z9114 Patient's other noncompliance with medication regimen: Secondary | ICD-10-CM

## 2016-05-26 DIAGNOSIS — I482 Chronic atrial fibrillation: Secondary | ICD-10-CM

## 2016-05-26 DIAGNOSIS — E876 Hypokalemia: Secondary | ICD-10-CM | POA: Diagnosis present

## 2016-05-26 DIAGNOSIS — L0390 Cellulitis, unspecified: Secondary | ICD-10-CM | POA: Diagnosis present

## 2016-05-26 DIAGNOSIS — Z7901 Long term (current) use of anticoagulants: Secondary | ICD-10-CM

## 2016-05-26 DIAGNOSIS — Z888 Allergy status to other drugs, medicaments and biological substances status: Secondary | ICD-10-CM

## 2016-05-26 LAB — CBC
HEMATOCRIT: 28.1 % — AB (ref 36.0–46.0)
Hemoglobin: 8.9 g/dL — ABNORMAL LOW (ref 12.0–15.0)
MCH: 30.9 pg (ref 26.0–34.0)
MCHC: 31.7 g/dL (ref 30.0–36.0)
MCV: 97.6 fL (ref 78.0–100.0)
PLATELETS: 104 10*3/uL — AB (ref 150–400)
RBC: 2.88 MIL/uL — ABNORMAL LOW (ref 3.87–5.11)
RDW: 16.4 % — AB (ref 11.5–15.5)
WBC: 8.7 10*3/uL (ref 4.0–10.5)

## 2016-05-26 LAB — BASIC METABOLIC PANEL
ANION GAP: 9 (ref 5–15)
BUN: 14 mg/dL (ref 6–20)
CALCIUM: 7.9 mg/dL — AB (ref 8.9–10.3)
CO2: 24 mmol/L (ref 22–32)
CREATININE: 0.95 mg/dL (ref 0.44–1.00)
Chloride: 105 mmol/L (ref 101–111)
GFR calc Af Amer: >60 mL/min (ref >60)
Glucose, Bld: 126 mg/dL — ABNORMAL HIGH (ref 65–99)
Potassium: 2.8 mmol/L — ABNORMAL LOW (ref 3.5–5.1)
Sodium: 138 mmol/L (ref 135–145)

## 2016-05-26 LAB — I-STAT TROPONIN, ED: Troponin i, poc: 0 ng/mL (ref 0.00–0.08)

## 2016-05-26 LAB — CBG MONITORING, ED: GLUCOSE-CAPILLARY: 86 mg/dL (ref 65–99)

## 2016-05-26 MED ORDER — CLINDAMYCIN PHOSPHATE 600 MG/50ML IV SOLN
600.0000 mg | Freq: Three times a day (TID) | INTRAVENOUS | Status: DC
  Administered 2016-05-27 – 2016-06-02 (×19): 600 mg via INTRAVENOUS
  Filled 2016-05-26 (×24): qty 50

## 2016-05-26 MED ORDER — ACETAMINOPHEN 325 MG PO TABS
650.0000 mg | ORAL_TABLET | Freq: Four times a day (QID) | ORAL | Status: DC | PRN
  Administered 2016-05-30: 650 mg via ORAL
  Filled 2016-05-26: qty 2

## 2016-05-26 MED ORDER — MOMETASONE FURO-FORMOTEROL FUM 200-5 MCG/ACT IN AERO
2.0000 | INHALATION_SPRAY | Freq: Two times a day (BID) | RESPIRATORY_TRACT | Status: DC
Start: 2016-05-26 — End: 2016-06-02
  Administered 2016-05-26 – 2016-06-02 (×14): 2 via RESPIRATORY_TRACT
  Filled 2016-05-26: qty 8.8

## 2016-05-26 MED ORDER — MOMETASONE FURO-FORMOTEROL FUM 200-5 MCG/ACT IN AERO
INHALATION_SPRAY | RESPIRATORY_TRACT | Status: AC
  Filled 2016-05-26: qty 8.8

## 2016-05-26 MED ORDER — ALBUTEROL SULFATE (2.5 MG/3ML) 0.083% IN NEBU
5.0000 mg | INHALATION_SOLUTION | Freq: Once | RESPIRATORY_TRACT | Status: AC
  Administered 2016-05-26: 5 mg via RESPIRATORY_TRACT
  Filled 2016-05-26: qty 6

## 2016-05-26 MED ORDER — ONDANSETRON HCL 4 MG PO TABS
4.0000 mg | ORAL_TABLET | Freq: Four times a day (QID) | ORAL | Status: DC | PRN
  Administered 2016-05-30 – 2016-05-31 (×3): 4 mg via ORAL
  Filled 2016-05-26 (×3): qty 1

## 2016-05-26 MED ORDER — IPRATROPIUM-ALBUTEROL 0.5-2.5 (3) MG/3ML IN SOLN
3.0000 mL | Freq: Four times a day (QID) | RESPIRATORY_TRACT | Status: DC
  Administered 2016-05-26 – 2016-06-02 (×25): 3 mL via RESPIRATORY_TRACT
  Filled 2016-05-26 (×28): qty 3

## 2016-05-26 MED ORDER — POTASSIUM CHLORIDE CRYS ER 20 MEQ PO TBCR
40.0000 meq | EXTENDED_RELEASE_TABLET | Freq: Once | ORAL | Status: AC
  Administered 2016-05-26: 40 meq via ORAL
  Filled 2016-05-26: qty 2

## 2016-05-26 MED ORDER — RIVAROXABAN 15 MG PO TABS
15.0000 mg | ORAL_TABLET | Freq: Every day | ORAL | Status: DC
  Administered 2016-05-27 – 2016-06-01 (×6): 15 mg via ORAL
  Filled 2016-05-26 (×6): qty 1

## 2016-05-26 MED ORDER — ALBUTEROL SULFATE (2.5 MG/3ML) 0.083% IN NEBU
5.0000 mg | INHALATION_SOLUTION | RESPIRATORY_TRACT | Status: AC
  Administered 2016-05-26 (×2): 5 mg via RESPIRATORY_TRACT
  Filled 2016-05-26: qty 6

## 2016-05-26 MED ORDER — CLINDAMYCIN PHOSPHATE 600 MG/50ML IV SOLN
INTRAVENOUS | Status: AC
  Filled 2016-05-26: qty 50

## 2016-05-26 MED ORDER — ORAL CARE MOUTH RINSE
15.0000 mL | Freq: Two times a day (BID) | OROMUCOSAL | Status: DC
  Administered 2016-05-26 – 2016-06-02 (×12): 15 mL via OROMUCOSAL

## 2016-05-26 MED ORDER — DILTIAZEM HCL ER COATED BEADS 240 MG PO CP24
240.0000 mg | ORAL_CAPSULE | Freq: Every day | ORAL | Status: DC
  Administered 2016-05-27 – 2016-06-02 (×7): 240 mg via ORAL
  Filled 2016-05-26 (×7): qty 1

## 2016-05-26 MED ORDER — GUAIFENESIN ER 600 MG PO TB12
1200.0000 mg | ORAL_TABLET | Freq: Two times a day (BID) | ORAL | Status: DC
  Administered 2016-05-26 – 2016-06-02 (×14): 1200 mg via ORAL
  Filled 2016-05-26 (×14): qty 2

## 2016-05-26 MED ORDER — ALBUTEROL SULFATE (2.5 MG/3ML) 0.083% IN NEBU
2.5000 mg | INHALATION_SOLUTION | RESPIRATORY_TRACT | Status: DC | PRN
  Administered 2016-05-28 – 2016-06-02 (×12): 2.5 mg via RESPIRATORY_TRACT
  Filled 2016-05-26 (×11): qty 3

## 2016-05-26 MED ORDER — PREDNISONE 50 MG PO TABS
60.0000 mg | ORAL_TABLET | Freq: Once | ORAL | Status: AC
  Administered 2016-05-26: 13:00:00 60 mg via ORAL
  Filled 2016-05-26: qty 1

## 2016-05-26 MED ORDER — ACETAMINOPHEN 650 MG RE SUPP: 650.0000 mg | Freq: Four times a day (QID) | RECTAL | Status: DC | PRN

## 2016-05-26 MED ORDER — ONDANSETRON HCL 4 MG/2ML IJ SOLN
4.0000 mg | Freq: Four times a day (QID) | INTRAMUSCULAR | Status: DC | PRN
  Administered 2016-05-27 – 2016-06-01 (×10): 4 mg via INTRAVENOUS
  Filled 2016-05-26 (×10): qty 2

## 2016-05-26 MED ORDER — POTASSIUM CHLORIDE CRYS ER 20 MEQ PO TBCR
40.0000 meq | EXTENDED_RELEASE_TABLET | ORAL | Status: AC
  Administered 2016-05-26 – 2016-05-27 (×3): 40 meq via ORAL
  Filled 2016-05-26 (×3): qty 2

## 2016-05-26 MED ORDER — METOPROLOL TARTRATE 50 MG PO TABS
50.0000 mg | ORAL_TABLET | Freq: Two times a day (BID) | ORAL | Status: DC
  Administered 2016-05-26 – 2016-06-02 (×14): 50 mg via ORAL
  Filled 2016-05-26 (×15): qty 1

## 2016-05-26 MED ORDER — PANTOPRAZOLE SODIUM 40 MG PO TBEC
40.0000 mg | DELAYED_RELEASE_TABLET | Freq: Every day | ORAL | Status: DC
  Administered 2016-05-27 – 2016-06-02 (×7): 40 mg via ORAL
  Filled 2016-05-26 (×7): qty 1

## 2016-05-26 MED ORDER — SODIUM CHLORIDE 0.9 % IV SOLN
INTRAVENOUS | Status: DC
  Administered 2016-05-26: 14:00:00 via INTRAVENOUS

## 2016-05-26 MED ORDER — ALBUTEROL SULFATE (2.5 MG/3ML) 0.083% IN NEBU
INHALATION_SOLUTION | RESPIRATORY_TRACT | Status: AC
  Administered 2016-05-26: 2.5 mg
  Filled 2016-05-26: qty 3

## 2016-05-26 MED ORDER — HYDROCODONE-ACETAMINOPHEN 10-325 MG PO TABS
1.0000 | ORAL_TABLET | Freq: Four times a day (QID) | ORAL | Status: DC | PRN
  Administered 2016-05-26 – 2016-05-31 (×19): 1 via ORAL
  Filled 2016-05-26 (×19): qty 1

## 2016-05-26 NOTE — ED Notes (Signed)
Lab at bedside

## 2016-05-26 NOTE — ED Triage Notes (Signed)
Pt comes in by EMS from home for shortness of breath and cough starting around 5 days ago. Pt has bilateral leg swelling. NAD noted. Pt has had some chest pain with her sob. None at present.

## 2016-05-26 NOTE — ED Notes (Signed)
Pt requesting something for pain and something to eat.  Notified edp.  EDP offered tylenol but pt refused at this time.  Meal tray ordered.

## 2016-05-26 NOTE — ED Provider Notes (Signed)
[facility] DEPT Provider Note   CSN: [id number] Arrival date & time: 05/26/16  1108  By signing my name below, I, Mayer Masker, attest that this documentation has been prepared under the direction and in the presence of Dorie Rank, MD. Electronically Signed: Mayer Masker, Scribe. 05/26/16. 12:43 PM.  History   Chief Complaint Chief Complaint  Patient presents with  . Shortness of Breath    The history is provided by the patient. No language interpreter was used.   HPI Comments: Lindsay Salazar is a 66 y.o. female who presents to the Emergency Department complaining of difficulty breathing x 2-3 days. She states when she woke up, she had difficulty this morning going to the bathroom as a result of her SOB. She reports associated CP. Describes the pain as sharp and aching that waxes and wanes for several minutes at a time. She has associated "pain around her lungs", pain towards her back on the right side, edema and peeling in her lower extremities since five months, coughing, sneezing, and chills. She notes there is a clear discharge on her legs. She denies fever.   Patient uses a breathing machine and takes oxygen at home. She has a Hx of cancer of her pancreas and is currently on chemotherapy. She is a smoker.  Past Medical History:  Diagnosis Date  . Anemia of chronic disease   . Arthritis    bursitis , fibromyalgia  . Arthritis    "right hip" (11/27/2015)  . Asthma   . Chronic bronchitis ([place])   . Chronic respiratory failure ([place])   . Chronic right hip pain   . COPD (chronic obstructive pulmonary disease) ([place])    wears home o2 prn  . Daily headache    "last 2-3 months" (11/27/2015)  . DVT (deep venous thrombosis) ([place]) 03/2016  . Exposure to TB    "mom had it when she was pregnant with me"  . Fibromyalgia   . GERD (gastroesophageal reflux disease)    use to have it but not now  . Heart murmur    70-  88 years old  . Hyperkalemia   . Hypertension   . On home oxygen  therapy    11/27/2015 "whenever I need it; don't know how many liters"  . PAF (paroxysmal atrial fibrillation) ([place])   . Pancreatic cancer ([place])   . Paroxysmal atrial flutter ([place])   . Pneumonia    "several times" (11/27/2015)  . Pneumothorax     Patient Active Problem List   Diagnosis Date Noted  . COPD exacerbation ([place]) 05/09/2016  . Cellulitis 04/26/2016  . COPD (chronic obstructive pulmonary disease) ([place]) 04/26/2016  . Chronic respiratory failure with hypoxia ([place]) 04/26/2016  . Hypoxemia 03/30/2016  . Acute on chronic respiratory failure with hypoxia ([place]) 03/29/2016  . Leukocytosis 03/29/2016  . Anemia of chronic disease 03/29/2016  . Essential hypertension 03/29/2016  . HCAP (healthcare-associated pneumonia) 03/28/2016  . B12 deficiency 01/24/2016  . COPD with acute exacerbation ([place]) 01/20/2016  . Pancreatic cancer ([place]) 12/07/2015  . Chronic anticoagulation-Xarelto 09/06/2015  . Gastrointestinal hemorrhage with melena 08/08/2015  . Atrial fibrillation ([place]) 07/17/2015    Past Surgical History:  Procedure Laterality Date  . ANTERIOR CERVICAL DECOMP/DISCECTOMY FUSION     "put 4 screws in"  . BACK SURGERY    . CARDIAC CATHETERIZATION  08/2004   Archie Endo 06/18/2010  . CARDIOVERSION N/A 07/18/2015   Procedure: CARDIOVERSION;  Surgeon: Josue Hector, MD;  Location[facility];  Service: Cardiovascular;  Laterality: N/A;  . ESOPHAGOGASTRODUODENOSCOPY N/A 08/09/2015   Procedure: ESOPHAGOGASTRODUODENOSCOPY (EGD);  Surgeon: Rogene Houston, MD;  Location: [facility] ENDO SUITE;  Service: Endoscopy;  Laterality: N/A;  . EUS N/A 11/29/2015   Procedure: UPPER ENDOSCOPIC ULTRASOUND (EUS) RADIAL;  Surgeon: Milus Banister, MD;  Location: [facility] ENDOSCOPY;  Service: Endoscopy;  Laterality: N/A;  . IR GENERIC HISTORICAL  12/18/2015   IR US GUIDE VASC ACCESS RIGHT 12/18/2015 Sandi Mariscal, MD [facility]-INTER[facility] RAD  . IR GENERIC HISTORICAL  12/18/2015   IR FLUORO GUIDE PORT INSERTION RIGHT 12/18/2015  Sandi Mariscal, MD [facility]  . TONSILLECTOMY    . TUBAL LIGATION      OB History    Gravida Para Term Preterm AB Living   2         2   SAB TAB Ectopic Multiple Live Births                   Home Medications    Prior to Admission medications   Medication Sig Start Date End Date Taking? Authorizing Provider  COMBIVENT RESPIMAT 20-100 MCG/ACT AERS respimat Inhale 1 puff into the lungs 2 (two) times daily. 12/03/15  Yes Ripudeep Krystal Eaton, MD  diltiazem (CARDIZEM CD) 240 MG 24 hr capsule Take 1 capsule (240 mg total) by mouth daily. 05/23/16 08/21/16 Yes Lendon Colonel, NP  guaiFENesin (MUCINEX) 600 MG 12 hr tablet Take 2 tablets (1,200 mg total) by mouth 2 (two) times daily. 04/04/16  Yes Ripudeep Krystal Eaton, MD  HYDROcodone-acetaminophen (NORCO) 10-325 MG tablet Take 1 tablet by mouth every 6 (six) hours as needed. 04/16/16  Yes Thomas S Kefalas, PA-C  ipratropium-albuterol (DUONEB) 0.5-2.5 (3) MG/3ML SOLN Inhale 3 mLs into the lungs every 6 (six) hours as needed (for shortness of breath).  03/03/16  Yes Historical Provider, MD  metoprolol tartrate (LOPRESSOR) 50 MG tablet Take 1 tablet (50 mg total) by mouth 2 (two) times daily. 03/02/16  Yes Kathie Dike, MD  ondansetron (ZOFRAN) 8 MG tablet Take 1 tablet by mouth daily as needed for nausea/vomiting. 12/10/15  Yes Historical Provider, MD  OXYGEN Inhale 2 L into the lungs daily as needed (for breathing).   Yes Historical Provider, MD  PACLitaxel Protein-Bound Part (ABRAXANE IV) Inject into the vein. Day 1, day 8, day 15, every 28 days   Yes Historical Provider, MD  pantoprazole (PROTONIX) 40 MG tablet Take 1 tablet (40 mg total) by mouth daily. 12/03/15  Yes Ripudeep Krystal Eaton, MD  prochlorperazine (COMPAZINE) 10 MG tablet Take 1 tablet by mouth daily as needed for nausea/vomiting. 12/10/15  Yes Historical Provider, MD  Rivaroxaban (XARELTO) 15 MG TABS tablet Take 1 tablet (15 mg total) by mouth daily with supper. 05/01/16  Yes Orson Eva, MD  SYMBICORT  160-4.5 MCG/ACT inhaler Inhale 1 puff into the lungs 2 (two) times daily. 12/03/15  Yes Ripudeep Krystal Eaton, MD  gabapentin (NEURONTIN) 100 MG capsule Take 1 capsule (100 mg total) by mouth 3 (three) times daily. Patient not taking: Reported on 05/26/2016 05/01/16   Orson Eva, MD  potassium chloride 20 MEQ/15ML (10%) SOLN Take 45 mLs (60 mEq total) by mouth daily. Patient not taking: Reported on 05/26/2016 04/16/16   Baird Cancer, PA-C  predniSONE (DELTASONE) 10 MG tablet Take 1 tablet (10 mg total) by mouth daily with breakfast. Take 6 tablets today and then decrease by 1 tablet daily until none are left. Patient not taking: Reported on 05/26/2016 05/12/16   Erline Hau,  MD    Family History Family History  Problem Relation Age of Onset  . COPD Mother   . Diabetes Father   . Stroke Father     Social History Social History  Substance Use Topics  . Smoking status: Former Smoker    Packs/day: 0.50    Years: 46.00    Types: Cigarettes    Start date: 10/09/1969    Quit date: 11/29/2015  . Smokeless tobacco: Never Used  . Alcohol use No     Comment: quit 30 years ago     Allergies   Ibuprofen; Other; Penicillins; Tiotropium bromide monohydrate; and Tramadol   Review of Systems Review of Systems  Constitutional: Negative for fever.  HENT: Positive for sneezing.   Respiratory: Positive for cough and shortness of breath.   Cardiovascular: Positive for chest pain and leg swelling.  Musculoskeletal: Positive for back pain.  All other systems reviewed and are negative.    Physical Exam Updated Vital Signs BP 117/70   Pulse 72   Temp 98.2 F (36.8 C) (Oral)   Resp 16   Ht 5\' 4"  (1.626 m)   Wt 54.4 kg   SpO2 95%   BMI 20.60 kg/m   Physical Exam  Constitutional: No distress.  HENT:  Head: Normocephalic and atraumatic.  Right Ear: External ear normal.  Left Ear: External ear normal.  Eyes: Conjunctivae are normal. Right eye exhibits no discharge. Left eye  exhibits no discharge. No scleral icterus.  Neck: Neck supple. No tracheal deviation present.  Cardiovascular: Normal rate, regular rhythm and intact distal pulses.   Pulmonary/Chest: Effort normal. No stridor. No respiratory distress. She has wheezes. She has no rales.  Abdominal: Soft. Bowel sounds are normal. She exhibits no distension. There is no tenderness. There is no rebound and no guarding.  Musculoskeletal: She exhibits tenderness. She exhibits no edema.  Tender to very light touch of the skin left lower extremity, no drainage, no edema, no erythema  Neurological: She is alert. She has normal strength. No sensory deficit. Cranial nerve deficit: no gross deficits. She exhibits normal muscle tone. She displays no seizure activity. Coordination normal.  Skin: Skin is warm and dry. No rash noted. She is not diaphoretic.  Psychiatric: She has a normal mood and affect.  Nursing note and vitals reviewed.    ED Treatments / Results  DIAGNOSTIC STUDIES: Oxygen Saturation is 100% on RA, normal by my interpretation.    COORDINATION OF CARE: 12:23 PM Discussed treatment plan with pt at bedside and pt agreed to plan.    Labs (all labs ordered are listed, but only abnormal results are displayed) Labs Reviewed  BASIC METABOLIC PANEL - Abnormal; Notable for the following:       Result Value   Potassium 2.8 (*)    Glucose, Bld 126 (*)    Calcium 7.9 (*)    All other components within normal limits  CBC - Abnormal; Notable for the following:    RBC 2.88 (*)    Hemoglobin 8.9 (*)    HCT 28.1 (*)    RDW 16.4 (*)    Platelets 104 (*)    All other components within normal limits  CBG MONITORING, ED  I-STAT TROPOININ, ED    EKG  EKG Interpretation  Date/Time:  Monday May 26 2016 11:18:51 EDT Ventricular Rate:  61 PR Interval:    QRS Duration: 88 QT Interval:  406 QTC Calculation: 409 R Axis:   64 Text Interpretation:  Atrial fibrillation Anteroseptal infarct,  old Repol abnrm  suggests ischemia, diffuse leads No significant change since last tracing Confirmed by [facility]  MD-J, Marvalene Barrett [phone]) on 05/26/2016 12:43:58 PM       Radiology Dg Chest 2 View  Result Date: 05/26/2016 CLINICAL DATA:  Cough, congestion. Bilateral lower leg swelling for 5 days. EXAM: CHEST  2 VIEW COMPARISON:  05/08/2016 FINDINGS: There is hyperinflation of the lungs compatible with COPD. Small left pleural effusion again noted, stable. Right Port-A-Cath is unchanged. Heart is normal size. No confluent opacities. IMPRESSION: COPD.  Small left pleural effusion, stable. Electronically Signed   By: Rolm Baptise M.D.   On: 05/26/2016 12:21    Procedures Procedures (including critical care time)  Medications Ordered in ED Medications  0.9 %  sodium chloride infusion ( Intravenous New Bag/Given 05/26/16 1410)  albuterol (PROVENTIL) (2.5 MG/3ML) 0.083% nebulizer solution 5 mg (5 mg Nebulization Given 05/26/16 1138)  predniSONE (DELTASONE) tablet 60 mg (60 mg Oral Given 05/26/16 1238)  albuterol (PROVENTIL) (2.5 MG/3ML) 0.083% nebulizer solution 5 mg (5 mg Nebulization Given 05/26/16 1308)     Initial Impression / Assessment and Plan / ED Course  I have reviewed the triage vital signs and the nursing notes.  Pertinent labs & imaging results that were available during my care of the patient were reviewed by me and considered in my medical decision making (see chart for details).  Clinical Course as of May 26 1428  Mon May 26, 2016  1427 Pt was given breathing treatments in the ED.  Likely contributing to her hypokalemia.  Pt is still feeling short of breath.  Does not feel much better and feels like she needs to hospitalized.  Vitals are stable.  Not hypoxic or in distress.  [JK]    Clinical Course User Index [JK] Dorie Rank, MD    Pt presents to the ED with dyspnea.  CXR without pna.  Labs show worsening anemia.  Pt states she has had nosebleeds but denies blood in her stool.   Pt is still feeling  short of breath and is wheezing after treatments.  Will consult with medical service regarding hospitalization and treatment. Final Clinical Impressions(s) / ED Diagnoses   Final diagnoses:  COPD exacerbation (HCC)  Hypokalemia  Anemia, unspecified type    New Prescriptions New Prescriptions   No medications on file  I personally performed the services described in this documentation, which was scribed in my presence.  The recorded information has been reviewed and is accurate.     Dorie Rank, MD 05/26/16 1430

## 2016-05-26 NOTE — H&P (Signed)
History and Physical    Lindsay HATAWAY EXH:[id number] DOB: 01-Feb-1951 DOA: 05/26/2016  PCP: [name], PA-C Patient coming from: Home  I have personally briefly reviewed patient's old medical records in [place]  Chief Complaint: Shortness of breath  HPI: Lindsay Salazar is a 66 y.o. female with medical history significant of oxygen dependent COPD, atrial fibrillation on anticoagulation, presents to the hospital with complaints of shortness of breath. She reports that yesterday she was in her usual state of health. Overnight she became increasingly short of breath, coughing and wheezing. She is using nebulizer without significant improvement. She denies any fever. She does have a productive cough. No vomiting or diarrhea. She also notes that overnight she has developed redness, swelling and pain in her left lower extremity. She did not notice any discharge from this area.  ED Course: In the emergency room, she was initially felt to have COPD exacerbation due to persistent shortness of breath and wheezing. She received several bronchodilator therapies with improvement. Since she has significant cellulitis and shortness of breath sheet and referred for admission.  Review of Systems: As per HPI otherwise 10 point review of systems negative.    Past Medical History:  Diagnosis Date  . Anemia of chronic disease   . Arthritis    bursitis , fibromyalgia  . Arthritis    "right hip" (11/27/2015)  . Asthma   . Chronic bronchitis ([place])   . Chronic respiratory failure ([place])   . Chronic right hip pain   . COPD (chronic obstructive pulmonary disease) ([place])    wears home o2 prn  . Daily headache    "last 2-3 months" (11/27/2015)  . DVT (deep venous thrombosis) ([place]) 03/2016  . Exposure to TB    "mom had it when she was pregnant with me"  . Fibromyalgia   . GERD (gastroesophageal reflux disease)    use to have it but not now  . Heart murmur    9-  1 years old  . Hyperkalemia   .  Hypertension   . On home oxygen therapy    11/27/2015 "whenever I need it; don't know how many liters"  . PAF (paroxysmal atrial fibrillation) ([place])   . Pancreatic cancer ([place])   . Paroxysmal atrial flutter ([place])   . Pneumonia    "several times" (11/27/2015)  . Pneumothorax     Past Surgical History:  Procedure Laterality Date  . ANTERIOR CERVICAL DECOMP/DISCECTOMY FUSION     "put 4 screws in"  . BACK SURGERY    . CARDIAC CATHETERIZATION  08/2004   Archie Endo 06/18/2010  . CARDIOVERSION N/A 07/18/2015   Procedure: CARDIOVERSION;  Surgeon: Josue Hector, MD;  [facility];  Service: Cardiovascular;  Laterality: N/A;  . ESOPHAGOGASTRODUODENOSCOPY N/A 08/09/2015   Procedure: ESOPHAGOGASTRODUODENOSCOPY (EGD);  Surgeon: Rogene Houston, MD;  Location[facility];  Service: Endoscopy;  Laterality: N/A;  . EUS N/A 11/29/2015   Procedure: UPPER ENDOSCOPIC ULTRASOUND (EUS) RADIAL;  Surgeon: Milus Banister, MD;  [facility];  Service: Endoscopy;  Laterality: N/A;  . IR GENERIC HISTORICAL  12/18/2015   IR US GUIDE VASC ACCESS RIGHT 12/18/2015 Sandi Mariscal, MD [facility]  . IR GENERIC HISTORICAL  12/18/2015   IR FLUORO GUIDE PORT INSERTION RIGHT 12/18/2015 Sandi Mariscal, MD [facility]  . TONSILLECTOMY    . TUBAL LIGATION       reports that she quit smoking about 5 months ago. Her smoking use included Cigarettes. She  started smoking about 46 years ago. She has a 23.00 pack-year smoking history. She has never used smokeless tobacco. She reports that she does not drink alcohol or use drugs.  Allergies  Allergen Reactions  . Ibuprofen Nausea And Vomiting  . Other     Seafood   . Penicillins Swelling    Has patient had a PCN reaction causing immediate rash, facial/tongue/throat swelling, SOB or lightheadedness with hypotension: Yes Has patient had a PCN reaction causing severe rash involving mucus membranes or skin necrosis: No Has patient had a PCN reaction that  required hospitalization No Has patient had a PCN reaction occurring within the last 10 years: No If all of the above answers are "NO", then may proceed with Cephalosporin use.   . Tiotropium Bromide Monohydrate Itching  . Tramadol Nausea And Vomiting    Family History  Problem Relation Age of Onset  . COPD Mother   . Diabetes Father   . Stroke Father      Prior to Admission medications   Medication Sig Start Date End Date Taking? Authorizing Provider  COMBIVENT RESPIMAT 20-100 MCG/ACT AERS respimat Inhale 1 puff into the lungs 2 (two) times daily. 12/03/15  Yes Ripudeep Krystal Eaton, MD  diltiazem (CARDIZEM CD) 240 MG 24 hr capsule Take 1 capsule (240 mg total) by mouth daily. 05/23/16 08/21/16 Yes Lendon Colonel, NP  guaiFENesin (MUCINEX) 600 MG 12 hr tablet Take 2 tablets (1,200 mg total) by mouth 2 (two) times daily. 04/04/16  Yes Ripudeep Krystal Eaton, MD  HYDROcodone-acetaminophen (NORCO) 10-325 MG tablet Take 1 tablet by mouth every 6 (six) hours as needed. 04/16/16  Yes Thomas S Kefalas, PA-C  ipratropium-albuterol (DUONEB) 0.5-2.5 (3) MG/3ML SOLN Inhale 3 mLs into the lungs every 6 (six) hours as needed (for shortness of breath).  03/03/16  Yes Historical Provider, MD  metoprolol tartrate (LOPRESSOR) 50 MG tablet Take 1 tablet (50 mg total) by mouth 2 (two) times daily. 03/02/16  Yes Kathie Dike, MD  ondansetron (ZOFRAN) 8 MG tablet Take 1 tablet by mouth daily as needed for nausea/vomiting. 12/10/15  Yes Historical Provider, MD  OXYGEN Inhale 2 L into the lungs daily as needed (for breathing).   Yes Historical Provider, MD  PACLitaxel Protein-Bound Part (ABRAXANE IV) Inject into the vein. Day 1, day 8, day 15, every 28 days   Yes Historical Provider, MD  pantoprazole (PROTONIX) 40 MG tablet Take 1 tablet (40 mg total) by mouth daily. 12/03/15  Yes Ripudeep Krystal Eaton, MD  prochlorperazine (COMPAZINE) 10 MG tablet Take 1 tablet by mouth daily as needed for nausea/vomiting. 12/10/15  Yes Historical  Provider, MD  Rivaroxaban (XARELTO) 15 MG TABS tablet Take 1 tablet (15 mg total) by mouth daily with supper. 05/01/16  Yes Orson Eva, MD  SYMBICORT 160-4.5 MCG/ACT inhaler Inhale 1 puff into the lungs 2 (two) times daily. 12/03/15  Yes Ripudeep Krystal Eaton, MD  gabapentin (NEURONTIN) 100 MG capsule Take 1 capsule (100 mg total) by mouth 3 (three) times daily. Patient not taking: Reported on 05/26/2016 05/01/16   Orson Eva, MD  potassium chloride 20 MEQ/15ML (10%) SOLN Take 45 mLs (60 mEq total) by mouth daily. Patient not taking: Reported on 05/26/2016 04/16/16   Baird Cancer, PA-C  predniSONE (DELTASONE) 10 MG tablet Take 1 tablet (10 mg total) by mouth daily with breakfast. Take 6 tablets today and then decrease by 1 tablet daily until none are left. Patient not taking: Reported on 05/26/2016 05/12/16   Rayford Halsted  Isaac Bliss, MD    Physical Exam: Vitals:   05/26/16 1300 05/26/16 1308 05/26/16 1400 05/26/16 1634  BP: 113/62  117/70   Pulse: 78  72   Resp: 19  16   Temp:      TempSrc:      SpO2: 97% 99% 95% 96%  Weight:      Height:        Constitutional: NAD, calm, comfortable Vitals:   05/26/16 1300 05/26/16 1308 05/26/16 1400 05/26/16 1634  BP: 113/62  117/70   Pulse: 78  72   Resp: 19  16   Temp:      TempSrc:      SpO2: 97% 99% 95% 96%  Weight:      Height:       Eyes: PERRL, lids and conjunctivae normal ENMT: Mucous membranes are moist. Posterior pharynx clear of any exudate or lesions.Normal dentition.  Neck: normal, supple, no masses, no thyromegaly Respiratory: Scattered rhonchi, no wheezing Normal respiratory effort. No accessory muscle use.  Cardiovascular: Regular rate and rhythm, no murmurs / rubs / gallops. No extremity edema. 2+ pedal pulses. No carotid bruits.  Abdomen: no tenderness, no masses palpated. No hepatosplenomegaly. Bowel sounds positive.  Musculoskeletal: no clubbing / cyanosis. No joint deformity upper and lower extremities. Good ROM, no contractures.  Normal muscle tone.  Skin: Erythema over the dorsal aspect of left foot, mild edema in left foot Neurologic: CN 2-12 grossly intact. Sensation intact, DTR normal. Strength 5/5 in all 4.  Psychiatric: Normal judgment and insight. Alert and oriented x 3. Normal mood.     Labs on Admission: I have personally reviewed following labs and imaging studies  CBC:  Recent Labs Lab 05/26/16 1119  WBC 8.7  HGB 8.9*  HCT 28.1*  MCV 97.6  PLT 998*   Basic Metabolic Panel:  Recent Labs Lab 05/26/16 1119  NA 138  K 2.8*  CL 105  CO2 24  GLUCOSE 126*  BUN 14  CREATININE 0.95  CALCIUM 7.9*   GFR: Estimated Creatinine Clearance: 49.3 mL/min (by C-G formula based on SCr of 0.95 mg/dL). Liver Function Tests: No results for input(s): AST, ALT, ALKPHOS, BILITOT, PROT, ALBUMIN in the last 168 hours. No results for input(s): LIPASE, AMYLASE in the last 168 hours. No results for input(s): AMMONIA in the last 168 hours. Coagulation Profile: No results for input(s): INR, PROTIME in the last 168 hours. Cardiac Enzymes: No results for input(s): CKTOTAL, CKMB, CKMBINDEX, TROPONINI in the last 168 hours. BNP (last 3 results) No results for input(s): PROBNP in the last 8760 hours. HbA1C: No results for input(s): HGBA1C in the last 72 hours. CBG:  Recent Labs Lab 05/26/16 1248  GLUCAP 86   Lipid Profile: No results for input(s): CHOL, HDL, LDLCALC, TRIG, CHOLHDL, LDLDIRECT in the last 72 hours. Thyroid Function Tests: No results for input(s): TSH, T4TOTAL, FREET4, T3FREE, THYROIDAB in the last 72 hours. Anemia Panel: No results for input(s): VITAMINB12, FOLATE, FERRITIN, TIBC, IRON, RETICCTPCT in the last 72 hours. Urine analysis:    Component Value Date/Time   COLORURINE YELLOW 04/26/2016 1800   APPEARANCEUR HAZY (A) 04/26/2016 1800   LABSPEC 1.017 04/26/2016 1800   PHURINE 5.0 04/26/2016 1800   GLUCOSEU NEGATIVE 04/26/2016 1800   HGBUR NEGATIVE 04/26/2016 1800   BILIRUBINUR  NEGATIVE 04/26/2016 1800   KETONESUR NEGATIVE 04/26/2016 1800   PROTEINUR NEGATIVE 04/26/2016 1800   NITRITE NEGATIVE 04/26/2016 1800   LEUKOCYTESUR SMALL (A) 04/26/2016 1800    Radiological Exams on  Admission: Dg Chest 2 View  Result Date: 05/26/2016 CLINICAL DATA:  Cough, congestion. Bilateral lower leg swelling for 5 days. EXAM: CHEST  2 VIEW COMPARISON:  05/08/2016 FINDINGS: There is hyperinflation of the lungs compatible with COPD. Small left pleural effusion again noted, stable. Right Port-A-Cath is unchanged. Heart is normal size. No confluent opacities. IMPRESSION: COPD.  Small left pleural effusion, stable. Electronically Signed   By: Rolm Baptise M.D.   On: 05/26/2016 12:21    EKG: Independently reviewed. Atrial fibrillation  Assessment/Plan Active Problems:   Atrial fibrillation (HCC)   Pancreatic cancer (HCC)   Anemia of chronic disease   Essential hypertension   Cellulitis   Chronic respiratory failure with hypoxia (HCC)   COPD exacerbation (HCC)   Cellulitis of left foot    1. Cellulitis of her left foot. Start patient on Ancef. She does have wounds on her lower extremity and wound care will be consulted.  2. COPD exacerbation. Improved with frequent neb treatments. She does not have any wheezing at this time, we'll hold off on steroids for now. Continue bronchodilators.  3. Chronic respiratory failure with hypoxia. Respiratory status appears to be her baseline. Continue to monitor.  4. Anemia of chronic disease. Patient does not report any bloody stools. She has had epistaxis lately. Continue to monitor hemoglobin.  5. Atrial fibrillation. Rate controlled on Cardizem and metoprolol. Continue anticoagulation with Xarelto.  6. Hypertension. Stable. Continue current regimen  7. Pancreatic cancer. Follow-up with oncology and general surgery  DVT prophylaxis: Xarelto Code Status: full code Family Communication: no family present Disposition Plan: discharge home  once improved Consults called: wound care Admission status: inpatient, telemetry   Atilano Covelli MD [facility] Pager [phone]  If 7PM-7AM, please contact night-coverage [email or web address] Password [facility]  05/26/2016, 6:02 PM

## 2016-05-27 ENCOUNTER — Ambulatory Visit: Payer: Medicare HMO | Admitting: Orthopaedic Surgery

## 2016-05-27 LAB — CBC
HCT: 26.9 % — ABNORMAL LOW (ref 36.0–46.0)
HEMOGLOBIN: 8.2 g/dL — AB (ref 12.0–15.0)
MCH: 30.5 pg (ref 26.0–34.0)
MCHC: 30.5 g/dL (ref 30.0–36.0)
MCV: 100 fL (ref 78.0–100.0)
Platelets: 115 10*3/uL — ABNORMAL LOW (ref 150–400)
RBC: 2.69 MIL/uL — AB (ref 3.87–5.11)
RDW: 15.9 % — ABNORMAL HIGH (ref 11.5–15.5)
WBC: 6.6 10*3/uL (ref 4.0–10.5)

## 2016-05-27 LAB — BASIC METABOLIC PANEL
Anion gap: 9 (ref 5–15)
BUN: 24 mg/dL — AB (ref 6–20)
CHLORIDE: 103 mmol/L (ref 101–111)
CO2: 23 mmol/L (ref 22–32)
CREATININE: 1.62 mg/dL — AB (ref 0.44–1.00)
Calcium: 7.7 mg/dL — ABNORMAL LOW (ref 8.9–10.3)
GFR calc Af Amer: 37 mL/min — ABNORMAL LOW (ref >60)
GFR calc non Af Amer: 32 mL/min — ABNORMAL LOW (ref >60)
GLUCOSE: 182 mg/dL — AB (ref 65–99)
POTASSIUM: 4.8 mmol/L (ref 3.5–5.1)
SODIUM: 135 mmol/L (ref 135–145)

## 2016-05-27 LAB — MAGNESIUM: Magnesium: 1.3 mg/dL — ABNORMAL LOW (ref 1.7–2.4)

## 2016-05-27 NOTE — Progress Notes (Signed)
PROGRESS NOTE    Lindsay Salazar  YQI:[id number] DOB: 1950/02/24 DOA: 05/26/2016 PCP: [name], PA-C      Brief Narrative:  66 year old female with history of oxygen dependent COPD, atrial fibrillation on anticoagulation, presented to the hospital with complaints of shortness of breath and left foot pain. She somehow mild COPD exacerbation as well as cellulitis of the left foot. She is currently on antibiotics and improving. Anticipate that she will be in the hospital another 1-2 days.   Assessment & Plan:   Active Problems:   Atrial fibrillation (HCC)   Pancreatic cancer (HCC)   Anemia of chronic disease   Essential hypertension   Cellulitis   Chronic respiratory failure with hypoxia (HCC)   COPD exacerbation (HCC)   Cellulitis of left foot   1. Cellulitis of her left foot. Patient is on clindamycin due to PCN allergy. She does have wounds on her lower extremity and wound care has been consulted. Cellulitis appears to be improving. Continue current treatments  2. COPD exacerbation. Improved with frequent neb treatments. She does have some mild wheezing which is likely her baseline. She does not feel more short of breath, we'll hold off on steroids for now. Continue bronchodilators.  3. Chronic respiratory failure with hypoxia. Respiratory status appears to be her baseline. Continue to monitor.  4. Anemia of chronic disease. Patient does not report any bloody stools. She has had epistaxis recently. Continue to monitor hemoglobin.  5. Atrial fibrillation. Rate controlled on Cardizem and metoprolol. Continue anticoagulation with Xarelto.  6. Hypertension. Stable. Continue current regimen  7. Pancreatic cancer. Follow-up with oncology and general surgery    DVT prophylaxis: xarelto Code Status: Full code  Family Communication: No family present Disposition Plan: Discharge home once improved   Consultants:     Procedures:     Antimicrobials:    Clindamycin 4/23>>    Subjective: Feels that her breathing is getting better. Left foot is less painful.  Objective: Vitals:   05/27/16 0642 05/27/16 0754 05/27/16 0759 05/27/16 1341  BP: (!) 114/55     Pulse: 88     Resp: 20     Temp: 98.2 F (36.8 C)     TempSrc: Oral     SpO2: 98% 97% 97% 95%  Weight:      Height:        Intake/Output Summary (Last 24 hours) at 05/27/16 1349 Last data filed at 05/27/16 0900  Gross per 24 hour  Intake           709.17 ml  Output                0 ml  Net           709.17 ml   Filed Weights   05/26/16 1114  Weight: 54.4 kg (120 lb)    Examination:  General exam: Appears calm and comfortable  Respiratory system: Bilateral wheezes. Respiratory effort normal. Cardiovascular system: S1 & S2 heard, irregular. No JVD, murmurs, rubs, gallops or clicks. 1+ pedal edema. Gastrointestinal system: Abdomen is nondistended, soft and nontender. No organomegaly or masses felt. Normal bowel sounds heard. Central nervous system: Alert and oriented. No focal neurological deficits. Extremities: Symmetric 5 x 5 power. Skin: Erythema and swelling in left foot is improving Psychiatry: Judgement and insight appear normal. Mood & affect appropriate.     Data Reviewed: I have personally reviewed following labs and imaging studies  CBC:  Recent Labs Lab 05/26/16 1119 05/27/16 0434  WBC 8.7  6.6  HGB 8.9* 8.2*  HCT 28.1* 26.9*  MCV 97.6 100.0  PLT 104* 716*   Basic Metabolic Panel:  Recent Labs Lab 05/26/16 1119 05/27/16 0434  NA 138 135  K 2.8* 4.8  CL 105 103  CO2 24 23  GLUCOSE 126* 182*  BUN 14 24*  CREATININE 0.95 1.62*  CALCIUM 7.9* 7.7*  MG  --  1.3*   GFR: Estimated Creatinine Clearance: 28.9 mL/min (A) (by C-G formula based on SCr of 1.62 mg/dL (H)). Liver Function Tests: No results for input(s): AST, ALT, ALKPHOS, BILITOT, PROT, ALBUMIN in the last 168 hours. No results for input(s): LIPASE, AMYLASE in the last 168  hours. No results for input(s): AMMONIA in the last 168 hours. Coagulation Profile: No results for input(s): INR, PROTIME in the last 168 hours. Cardiac Enzymes: No results for input(s): CKTOTAL, CKMB, CKMBINDEX, TROPONINI in the last 168 hours. BNP (last 3 results) No results for input(s): PROBNP in the last 8760 hours. HbA1C: No results for input(s): HGBA1C in the last 72 hours. CBG:  Recent Labs Lab 05/26/16 1248  GLUCAP 86   Lipid Profile: No results for input(s): CHOL, HDL, LDLCALC, TRIG, CHOLHDL, LDLDIRECT in the last 72 hours. Thyroid Function Tests: No results for input(s): TSH, T4TOTAL, FREET4, T3FREE, THYROIDAB in the last 72 hours. Anemia Panel: No results for input(s): VITAMINB12, FOLATE, FERRITIN, TIBC, IRON, RETICCTPCT in the last 72 hours. Sepsis Labs: No results for input(s): PROCALCITON, LATICACIDVEN in the last 168 hours.  No results found for this or any previous visit (from the past 240 hour(s)).       Radiology Studies: Dg Chest 2 View  Result Date: 05/26/2016 CLINICAL DATA:  Cough, congestion. Bilateral lower leg swelling for 5 days. EXAM: CHEST  2 VIEW COMPARISON:  05/08/2016 FINDINGS: There is hyperinflation of the lungs compatible with COPD. Small left pleural effusion again noted, stable. Right Port-A-Cath is unchanged. Heart is normal size. No confluent opacities. IMPRESSION: COPD.  Small left pleural effusion, stable. Electronically Signed   By: Rolm Baptise M.D.   On: 05/26/2016 12:21        Scheduled Meds: . diltiazem  240 mg Oral Daily  . guaiFENesin  1,200 mg Oral BID  . ipratropium-albuterol  3 mL Inhalation Q6H  . mouth rinse  15 mL Mouth Rinse BID  . metoprolol  50 mg Oral BID  . mometasone-formoterol  2 puff Inhalation BID  . pantoprazole  40 mg Oral Daily  . Rivaroxaban  15 mg Oral Q supper   Continuous Infusions: . clindamycin (CLEOCIN) IV Stopped (05/27/16 1218)     LOS: 1 day    Time spent:  41mins    Benjimin Hadden, MD [facility] Pager [phone]  If 7PM-7AM, please contact night-coverage [email or web address] Password [email or web address] 05/27/2016, 1:49 PM

## 2016-05-27 NOTE — Consult Note (Signed)
[facility] Nurse wound consult note Consultation completed via telehealth cart  Reason for Consult: open blisters and cellulitis bilateral LE Wound type: partial thickness bullous skin injury related to edema. Patient reports non-compliance with diuretic lately. She has been admitted with COPD exacerbation and is known to have pancreatic CA. Three areas noted, 2 on the posterior right calf and 1 on the posterior left calf. They are mostly closed and/or have intact skin. The bedside nurse reports the area on the left calf has small break in the skin.  Wound bed: see above  Drainage (amount, consistency, odor) scant, from the left calf, otherwise the skin is intact Periwound: edema with reported palpable pulses bilaterally. Patient reports no prior history of compression wraps or stocking use. Would need ABI and follow up by primary care if compression needed to manage LE edema long term. Dressing procedure/placement/frequency: Single layer of xeroform as non adherent to the left posterior calf wound, secure with kerlix.  Soft silicone foam to the right posterior calf to protect from further injury.  Discussed POC with patient and bedside nurse.  Re consult if needed, will not follow at this time. Thanks  Shree Espey [employer], RN,CWOCN, CNS [phone])

## 2016-05-27 NOTE — Care Management Note (Signed)
Case Management Note  Patient Details  Name: Lindsay Salazar MRN: [id number] Date of Birth: Nov 15, 1950  Subjective/Objective:                  Pt admitted with copd and cellulitis. She is ind with ADL's. She has home oxygen, RW, BSC and neb machine pta. Pt has refused HH due to her living conditions in the past. She has CSW referral due to not having water at home.   Action/Plan: [place] return home with self care. CSW to see pt. CM will follow for DC planning needs.   Expected Discharge Date:  05/28/16               Expected Discharge Plan:  Home/Self Care  In-House Referral:  Clinical Social Work  Discharge planning Services  CM Consult  Post Acute Care Choice:  NA Choice offered to:  NA  Status of Service:  In process, will continue to follow  Sherald Barge, RN 05/27/2016, 10:09 AM

## 2016-05-28 DIAGNOSIS — L03115 Cellulitis of right lower limb: Secondary | ICD-10-CM

## 2016-05-28 DIAGNOSIS — D649 Anemia, unspecified: Secondary | ICD-10-CM

## 2016-05-28 LAB — BASIC METABOLIC PANEL
ANION GAP: 6 (ref 5–15)
Anion gap: 9 (ref 5–15)
BUN: 31 mg/dL — ABNORMAL HIGH (ref 6–20)
BUN: 30 mg/dL — ABNORMAL HIGH (ref 6–20)
CALCIUM: 8 mg/dL — AB (ref 8.9–10.3)
CHLORIDE: 103 mmol/L (ref 101–111)
CHLORIDE: 100 mmol/L — AB (ref 101–111)
CO2: 24 mmol/L (ref 22–32)
CO2: 24 mmol/L (ref 22–32)
Calcium: 8 mg/dL — ABNORMAL LOW (ref 8.9–10.3)
Creatinine, Ser: 1.64 mg/dL — ABNORMAL HIGH (ref 0.44–1.00)
Creatinine, Ser: 1.43 mg/dL — ABNORMAL HIGH (ref 0.44–1.00)
GFR calc Af Amer: 43 mL/min — ABNORMAL LOW (ref >60)
GFR calc non Af Amer: 31 mL/min — ABNORMAL LOW (ref >60)
GFR calc non Af Amer: 37 mL/min — ABNORMAL LOW (ref >60)
GFR, EST AFRICAN AMERICAN: 36 mL/min — AB (ref >60)
Glucose, Bld: 106 mg/dL — ABNORMAL HIGH (ref 65–99)
Glucose, Bld: 93 mg/dL (ref 65–99)
POTASSIUM: 4.5 mmol/L (ref 3.5–5.1)
Potassium: 5.6 mmol/L — ABNORMAL HIGH (ref 3.5–5.1)
SODIUM: 133 mmol/L — AB (ref 135–145)
SODIUM: 133 mmol/L — AB (ref 135–145)

## 2016-05-28 LAB — CBC
HCT: 27.3 % — ABNORMAL LOW (ref 36.0–46.0)
HEMOGLOBIN: 8.2 g/dL — AB (ref 12.0–15.0)
MCH: 30.3 pg (ref 26.0–34.0)
MCHC: 30 g/dL (ref 30.0–36.0)
MCV: 100.7 fL — AB (ref 78.0–100.0)
Platelets: 187 10*3/uL (ref 150–400)
RBC: 2.71 MIL/uL — AB (ref 3.87–5.11)
RDW: 16.2 % — ABNORMAL HIGH (ref 11.5–15.5)
WBC: 9.7 10*3/uL (ref 4.0–10.5)

## 2016-05-28 MED ORDER — SODIUM POLYSTYRENE SULFONATE 15 GM/60ML PO SUSP
30.0000 g | Freq: Once | ORAL | Status: AC
  Administered 2016-05-28: 30 g via ORAL
  Filled 2016-05-28: qty 120

## 2016-05-28 MED ORDER — POTASSIUM CHLORIDE 10 MEQ/100ML IV SOLN
INTRAVENOUS | Status: AC
  Filled 2016-05-28: qty 400

## 2016-05-28 MED ORDER — SODIUM CHLORIDE 0.9 % IV SOLN
INTRAVENOUS | Status: DC
  Administered 2016-05-28 – 2016-05-29 (×3): via INTRAVENOUS

## 2016-05-28 NOTE — Clinical Social Work Note (Signed)
Clinical Social Work Assessment  Patient Details  Name: Lindsay Salazar MRN: [id number] Date of Birth: [birth year]  Date of referral:  05/28/16               Reason for consult:  Discharge Planning                Permission sought to share information with:    Permission granted to share information::     Name::        Agency::     Relationship::     Contact Information:     Housing/Transportation Living arrangements for the past 2 months:  Single Family Home Source of Information:  Patient Patient Interpreter Needed:  None Criminal Activity/Legal Involvement Pertinent to Current Situation/Hospitalization:  No - Comment as needed Significant Relationships:  Significant Other Lives with:  Significant Other Do you feel safe going back to the place where you live?  Yes Need for family participation in patient care:  Yes (Comment)  Care giving concerns:  None identified.    Social Worker assessment / plan: Patient resides with her boyfriend and his brother. Patient stated that she has electricity and running water. She denied issues with housing, crisis or domestic concerns.  She stated that she feels safe.  LCSW signing off. Patient has no CSW needs.   Employment status:  Retired Forensic scientist:  Medicare PT Recommendations:  Not assessed at this time Information / Referral to community resources:     Patient/Family's Response to care:  No CSW Needs.  Patient/Family's Understanding of and Emotional Response to Diagnosis, Current Treatment, and Prognosis:  Patient understands her diagnosis, treatment and prognosis.   Emotional Assessment Appearance:  Appears stated age Attitude/Demeanor/Rapport:   (Cooperative) Affect (typically observed):  Accepting, Calm Orientation:  Oriented to Place, Oriented to Self, Oriented to  Time, Oriented to Situation Alcohol / Substance use:  Not Applicable Psych involvement (Current and /or in the community):  No (Comment)  Discharge Needs   Concerns to be addressed:  Discharge Planning Concerns Readmission within the last 30 days:  No Current discharge risk:  None Barriers to Discharge:  No Barriers Identified   Ihor Gully, LCSW 05/28/2016, 1:57 PM

## 2016-05-28 NOTE — Progress Notes (Signed)
PROGRESS NOTE    Lindsay Salazar  RSW:[id number] DOB: February 01, 1951 DOA: 05/26/2016 PCP: [name], PA-C   Brief Narrative: Lindsay Salazar is a 66 y.o. female with medical history significant of oxygen dependent COPD, atrial fibrillation on anticoagulation, who presented to the hospital with complaints of shortness of breath. She reports that yesterday she was in her usual state of health. Overnight she became increasingly short of breath, coughing and wheezing. She was using nebulizer without significant improvement. She denies any fever. She does have a productive cough. No vomiting or diarrhea. She also notes that overnight she has developed redness, swelling and pain in her left lower extremity. She did not notice any discharge from this area. Was admitted for Cellulitis and COPD exacerbation. Today she was Hyperkalemic.   Assessment & Plan:   Active Problems:   Atrial fibrillation (HCC)   Pancreatic cancer (HCC)   Anemia of chronic disease   Essential hypertension   Cellulitis   Chronic respiratory failure with hypoxia (HCC)   COPD exacerbation (HCC)   Cellulitis of left foot  1. Cellulitis of her Left Foot.  -C/w Clindamycin IV 600 mg q8h -She does have wounds on her lower extremity  -Wound care consulted and appreciate Recc's. -Per [facility] nurse would need ABI and follow up by primary care if compression needed to manage LE edema long term; -Recommends single layer of xeroform as non adherent to the left posterior calf wound, secure with kerlix.  Soft silicone foam to the right posterior calf to protect from further injury.  2. Acute COPD Exacerbation/Acute Bronchitis.  -Improved with frequent neb treatments.  -She has minimal wheezing at this time, we'll hold off on steroids for now.  -Continue Bronchodilators with DuoNeb q6h and Albuterol Nebs q2hprn -C/w Dulera 2 Puff RT IH BID -C/w Guaifenesin 1200 mg po BID -CT Chest showed no evidence of pneumonia. Small to moderate left  pleural effusion. There is bronchial wall  thickening most prominent in the left lower lobe. A component of acute bronchitis should be considered. There was also Emphysema and areas of lung scarring with mild interstitial thickening, similar to the prior chest CT.  3. Chronic Respiratory Failure with Hypoxia.  -Respiratory status appears to be her baseline.  -Continue Home O2 Requirements -Continue to monitor  4. Anemia of chronic disease.  -Patient does not report any bloody stools.  -BUN/Cr was 8.2/27.3; Suspect Hemoconcentration on Admission -She has had epistaxis lately. -Continue to monitor hemoglobin.  5. Atrial Fibrillation.  -C/w Telemetry -Rate controlled on Cardizem 240 mg po Daily and Metoprolol 50 mg po BID.  -Continue Anticoagulation with Xarelto 15 mg po Daily with Supper (CrCl was low).  6. Hypertension.  -Stable.  -Continue current regimen with Diltiazem and Metoprolol  7. Pancreatic cancer.  -CA 19-9 was elevated at 90 -Undergoing Chemotherapy -Follow-up with Oncology and General Surgery  8. AKI -Patient's BUN/Cr went from 12/1.03 -> 31/1.64 -C/w IVF with NS at 75 mL/hr and BUN/Cr improved to 30/1.43 this AM -Avoid Nephrotoxics and repeat CMP in Am  9. Hyperkalemia -Patient's K+ was 5.6 this AM -Give 1x dose of Kayexelate -Repeat K+ this AM was 4.5 -C/w IVF with NS at 75 mL/hr -Repeat CMP in AM  10. Hyponatremia -Sodium was 133 -C/w IVF with NS at 50mL/hr -Repeat CMP in AM  DVT prophylaxis: Anticoagulated with Xarelto Code Status: FULL CODE Family Communication: No family present at bedside Disposition Plan: Home with Home Health at D/C  Consultants:   None  Procedures:  -None  Antimicrobials: Anti-infectives    Start     Dose/Rate Route Frequency Ordered Stop   05/26/16 1830  clindamycin (CLEOCIN) IVPB 600 mg     600 mg 100 mL/hr over 30 Minutes Intravenous Every 8 hours 05/26/16 1758       Subjective: Seen and examined at  bedside and states leg was hurting and she was still having breathing difficulties. Upon examination when she was breathing normally had very little wheezing and when she was taking deep breaths was forcing a wheeze. States she had been nauseous earlier. No CP.   Objective: Vitals:   05/28/16 0117 05/28/16 0443 05/28/16 0459 05/28/16 0745  BP:  129/75    Pulse:  99    Resp:  20    Temp:  97.6 F (36.4 C)    TempSrc:  Oral    SpO2: 95% 93% (!) 88% 96%  Weight:      Height:        Intake/Output Summary (Last 24 hours) at 05/28/16 0753 Last data filed at 05/27/16 1930  Gross per 24 hour  Intake             1010 ml  Output              400 ml  Net              610 ml   Filed Weights   05/26/16 1114  Weight: 54.4 kg (120 lb)   Examination: Physical Exam:  Constitutional:  NAD and appears calm and comfortable Eyes: Lids and conjunctivae normal, sclerae anicteric  ENMT: External Ears, Nose appear normal.  Neck: Appears normal, supple, no cervical masses, normal ROM, no appreciable thyromegaly Respiratory: Diminished to auscultation bilaterally with mild expiratory wheezing (worse when patient forces breaths), No rales, rhonchi or crackles. Normal respiratory effort and patient is not tachypenic. No accessory muscle use.  Cardiovascular: RRR, no murmurs / rubs / gallops. S1 and S2 auscultated. 1+ Lower extremity edema. 2 Abdomen: Soft, non-tender, non-distended. No masses palpated. No appreciable hepatosplenomegaly. Bowel sounds positive.  GU: Deferred. Musculoskeletal: No clubbing / cyanosis of digits/nails. No joint deformity upper and lower extremities.  Skin: Left small posterior calf ulceration with erythema with edema; Left foot has some erythema and swelling  Neurologic: CN 2-12 grossly intact with no focal deficits. Sensation intact in all 4 Extremities,Romberg sign cerebellar reflexes not assessed.  Psychiatric: Normal judgment and insight. Alert and oriented x 3. Normal  mood and appropriate affect.   Data Reviewed: I have personally reviewed following labs and imaging studies  CBC:  Recent Labs Lab 05/26/16 1119 05/27/16 0434 05/28/16 0435  WBC 8.7 6.6 9.7  HGB 8.9* 8.2* 8.2*  HCT 28.1* 26.9* 27.3*  MCV 97.6 100.0 100.7*  PLT 104* 115* 503   Basic Metabolic Panel:  Recent Labs Lab 05/26/16 1119 05/27/16 0434 05/28/16 0435  NA 138 135 133*  K 2.8* 4.8 5.6*  CL 105 103 103  CO2 24 23 24   GLUCOSE 126* 182* 106*  BUN 14 24* 31*  CREATININE 0.95 1.62* 1.64*  CALCIUM 7.9* 7.7* 8.0*  MG  --  1.3*  --    GFR: Estimated Creatinine Clearance: 28.6 mL/min (A) (by C-G formula based on SCr of 1.64 mg/dL (H)). Liver Function Tests: No results for input(s): AST, ALT, ALKPHOS, BILITOT, PROT, ALBUMIN in the last 168 hours. No results for input(s): LIPASE, AMYLASE in the last 168 hours. No results for input(s): AMMONIA in the last 168  hours. Coagulation Profile: No results for input(s): INR, PROTIME in the last 168 hours. Cardiac Enzymes: No results for input(s): CKTOTAL, CKMB, CKMBINDEX, TROPONINI in the last 168 hours. BNP (last 3 results) No results for input(s): PROBNP in the last 8760 hours. HbA1C: No results for input(s): HGBA1C in the last 72 hours. CBG:  Recent Labs Lab 05/26/16 1248  GLUCAP 86   Lipid Profile: No results for input(s): CHOL, HDL, LDLCALC, TRIG, CHOLHDL, LDLDIRECT in the last 72 hours. Thyroid Function Tests: No results for input(s): TSH, T4TOTAL, FREET4, T3FREE, THYROIDAB in the last 72 hours. Anemia Panel: No results for input(s): VITAMINB12, FOLATE, FERRITIN, TIBC, IRON, RETICCTPCT in the last 72 hours. Sepsis Labs: No results for input(s): PROCALCITON, LATICACIDVEN in the last 168 hours.  No results found for this or any previous visit (from the past 240 hour(s)).   Radiology Studies: Dg Chest 2 View  Result Date: 05/26/2016 CLINICAL DATA:  Cough, congestion. Bilateral lower leg swelling for 5 days.  EXAM: CHEST  2 VIEW COMPARISON:  05/08/2016 FINDINGS: There is hyperinflation of the lungs compatible with COPD. Small left pleural effusion again noted, stable. Right Port-A-Cath is unchanged. Heart is normal size. No confluent opacities. IMPRESSION: COPD.  Small left pleural effusion, stable. Electronically Signed   By: Rolm Baptise M.D.   On: 05/26/2016 12:21   Scheduled Meds: . diltiazem  240 mg Oral Daily  . guaiFENesin  1,200 mg Oral BID  . ipratropium-albuterol  3 mL Inhalation Q6H  . mouth rinse  15 mL Mouth Rinse BID  . metoprolol  50 mg Oral BID  . mometasone-formoterol  2 puff Inhalation BID  . pantoprazole  40 mg Oral Daily  . Rivaroxaban  15 mg Oral Q supper   Continuous Infusions: . clindamycin (CLEOCIN) IV Stopped (05/28/16 0319)    LOS: 2 days   Kerney Elbe, DO [facility] Pager [phone]  If 7PM-7AM, please contact night-coverage [email or web address] Password [email or web address] 05/28/2016, 7:53 AM

## 2016-05-29 ENCOUNTER — Ambulatory Visit (HOSPITAL_COMMUNITY): Payer: Medicare HMO

## 2016-05-29 ENCOUNTER — Other Ambulatory Visit (HOSPITAL_COMMUNITY): Payer: Medicare HMO

## 2016-05-29 LAB — CBC WITH DIFFERENTIAL/PLATELET
BASOS ABS: 0 10*3/uL (ref 0.0–0.1)
Basophils Relative: 1 %
Eosinophils Absolute: 0.1 10*3/uL (ref 0.0–0.7)
Eosinophils Relative: 2 %
HEMATOCRIT: 28.9 % — AB (ref 36.0–46.0)
Hemoglobin: 9.2 g/dL — ABNORMAL LOW (ref 12.0–15.0)
LYMPHS ABS: 1.2 10*3/uL (ref 0.7–4.0)
LYMPHS PCT: 20 %
MCH: 31.2 pg (ref 26.0–34.0)
MCHC: 31.8 g/dL (ref 30.0–36.0)
MCV: 98 fL (ref 78.0–100.0)
MONO ABS: 0.7 10*3/uL (ref 0.1–1.0)
MONOS PCT: 11 %
NEUTROS ABS: 4.3 10*3/uL (ref 1.7–7.7)
Neutrophils Relative %: 66 %
Platelets: 218 10*3/uL (ref 150–400)
RBC: 2.95 MIL/uL — ABNORMAL LOW (ref 3.87–5.11)
RDW: 16.7 % — AB (ref 11.5–15.5)
WBC: 6.3 10*3/uL (ref 4.0–10.5)

## 2016-05-29 LAB — COMPREHENSIVE METABOLIC PANEL
ALT: 24 U/L (ref 14–54)
AST: 14 U/L — AB (ref 15–41)
Albumin: 2.6 g/dL — ABNORMAL LOW (ref 3.5–5.0)
Alkaline Phosphatase: 97 U/L (ref 38–126)
Anion gap: 9 (ref 5–15)
BILIRUBIN TOTAL: 0.3 mg/dL (ref 0.3–1.2)
BUN: 26 mg/dL — AB (ref 6–20)
CO2: 22 mmol/L (ref 22–32)
Calcium: 7.4 mg/dL — ABNORMAL LOW (ref 8.9–10.3)
Chloride: 104 mmol/L (ref 101–111)
Creatinine, Ser: 1.24 mg/dL — ABNORMAL HIGH (ref 0.44–1.00)
GFR calc Af Amer: 51 mL/min — ABNORMAL LOW (ref >60)
GFR, EST NON AFRICAN AMERICAN: 44 mL/min — AB (ref >60)
Glucose, Bld: 117 mg/dL — ABNORMAL HIGH (ref 65–99)
POTASSIUM: 4.3 mmol/L (ref 3.5–5.1)
Sodium: 135 mmol/L (ref 135–145)
Total Protein: 5 g/dL — ABNORMAL LOW (ref 6.5–8.1)

## 2016-05-29 LAB — PHOSPHORUS: Phosphorus: 3.7 mg/dL (ref 2.5–4.6)

## 2016-05-29 LAB — MAGNESIUM: MAGNESIUM: 1.2 mg/dL — AB (ref 1.7–2.4)

## 2016-05-29 MED ORDER — METHYLPREDNISOLONE SODIUM SUCC 125 MG IJ SOLR
125.0000 mg | Freq: Once | INTRAMUSCULAR | Status: AC
  Administered 2016-05-29: 125 mg via INTRAVENOUS
  Filled 2016-05-29: qty 2

## 2016-05-29 MED ORDER — MENTHOL 3 MG MT LOZG
1.0000 | LOZENGE | OROMUCOSAL | Status: DC | PRN
  Administered 2016-05-29 – 2016-06-01 (×4): 3 mg via ORAL
  Filled 2016-05-29 (×4): qty 9

## 2016-05-29 MED ORDER — MAGNESIUM SULFATE 4 GM/100ML IV SOLN
4.0000 g | Freq: Once | INTRAVENOUS | Status: AC
  Administered 2016-05-29: 4 g via INTRAVENOUS
  Filled 2016-05-29: qty 100

## 2016-05-29 MED ORDER — SODIUM CHLORIDE 3 % IN NEBU
4.0000 mL | INHALATION_SOLUTION | Freq: Every day | RESPIRATORY_TRACT | Status: AC
Start: 2016-05-29 — End: 2016-06-01
  Administered 2016-05-29: 4 mL via RESPIRATORY_TRACT
  Filled 2016-05-29 (×2): qty 4

## 2016-05-29 MED ORDER — METHYLPREDNISOLONE SODIUM SUCC 40 MG IJ SOLR
40.0000 mg | Freq: Two times a day (BID) | INTRAMUSCULAR | Status: DC
  Administered 2016-05-29 – 2016-06-01 (×6): 40 mg via INTRAVENOUS
  Filled 2016-05-29 (×7): qty 1

## 2016-05-29 NOTE — Progress Notes (Signed)
PROGRESS NOTE    Lindsay Salazar  VFI:[id number] DOB: [birth year] DOA: 05/26/2016 PCP: [name], PA-C   Brief Narrative: Lindsay Salazar is a 66 y.o. female with medical history significant of oxygen dependent COPD, atrial fibrillation on anticoagulation, who presented to the hospital with complaints of shortness of breath. She reports that yesterday she was in her usual state of health. Overnight she became increasingly short of breath, coughing and wheezing. She was using nebulizer without significant improvement. She denies any fever. She does have a productive cough. No vomiting or diarrhea. She also notes that overnight she has developed redness, swelling and pain in her left lower extremity. She did not notice any discharge from this area. Was admitted for Cellulitis and COPD exacerbation. She was hyperkalemic yesterday but improved after kayexalate and IVF rehydration. Wheezing appeared worse today so patient was placed on IV Methylprednisolone.    Assessment & Plan:   Active Problems:   Atrial fibrillation (HCC)   Pancreatic cancer (HCC)   Anemia of chronic disease   Essential hypertension   Cellulitis   Chronic respiratory failure with hypoxia (HCC)   COPD exacerbation (HCC)   Cellulitis of left foot  1. Cellulitis of her Left Foot.  -C/w Clindamycin IV 600 mg q8h -She does have wounds on her lower extremity  -Wound care consulted and appreciate Recc's. -Per [place] nurse would need ABI and follow up by primary care if compression needed to manage LE edema long term; -Recommends single layer of xeroform as non adherent to the left posterior calf wound, secure with kerlix.  Soft silicone foam to the right posterior calf to protect from further injury. -Still swollen extremities but erythema looked better.   2. Acute COPD Exacerbation/Acute Bronchitis.  -Wheezing was worse today so was given IV Methylprednisolone 125 mg once and then 40 mg of IV Methylprednisolone q12h -Continue  Bronchodilators with DuoNeb q6h and Albuterol Nebs q2hprn -C/w Dulera 2 Puff RT IH BID -C/w Guaifenesin 1200 mg po BID -Added Hypertonic Saline Nebs 4 mL x 3 days -CT Chest showed no evidence of pneumonia. Small to moderate left pleural effusion. There is bronchial wall  thickening most prominent in the left lower lobe. A component of acute bronchitis should be considered. There was also Emphysema and areas of lung scarring with mild interstitial thickening, similar to the prior chest CT. -Also added Cepacol 3 mg Lozenge po prn Sore Throat  3. Chronic Respiratory Failure with Hypoxia.  -Respiratory status appears to be her baseline.  -Continue Home O2 Requirements -Added Steroids for COPD -Continue to monitor  4. Anemia of chronic disease.  -Patient does not report any bloody stools.  -BUN/Cr went from 8.2/27.3 -> 9.2/28.9 -She has had epistaxis lately. -Continue to monitor CBC and repeat in AM  5. Atrial Fibrillation.  -C/w Telemetry -Rate controlled on Cardizem 240 mg po Daily and Metoprolol 50 mg po BID.  -Continue Anticoagulation with Xarelto 15 mg po Daily with Supper (CrCl was low).  6. Hypertension.  -Stable.  -Continue current regimen with Diltiazem and Metoprolol  7. Pancreatic Cancer.  -CA 19-9 was elevated at 90 -Undergoing Chemotherapy -Follow-up with Oncology and General Surgery  8. AKI, Improving -Patient's BUN/Cr went from 31/1.64 -> 30/1.43 -> 26/1.24 -D/C'd IVF with NS at 75 mL/hr  -Avoid Nephrotoxics and repeat CMP in Am  9. Hyperkalemia, improved -Patient's K+ was 4.3 this AM -Continue to Monitor and Repeat CMP in AM  10. Hyponatremia, improved -Sodium was 133 and improved to 135 -  D/C'd IVF with NS at 31mL/hr -Repeat CMP in AM  11. Hypomagnesemia -Mag was 1.2 this AM -Replete with IV Mag Sulfate 2 grams x2 -Repeat Mag Level in AM  DVT prophylaxis: Anticoagulated with Xarelto Code Status: FULL CODE Family Communication: No family present  at bedside Disposition Plan: Home with Home Health vs SNF (If patient is agreeable)   Consultants:   None  Procedures:  -None  Antimicrobials: Anti-infectives    Start     Dose/Rate Route Frequency Ordered Stop   05/26/16 1830  clindamycin (CLEOCIN) IVPB 600 mg     600 mg 100 mL/hr over 30 Minutes Intravenous Every 8 hours 05/26/16 1758       Subjective: Seen and examined at bedside and states she was very weak and felt drained. No Nausea or vomiting but states she thinks her breathing is still bad. No CP or SOB. Admits to mild lightheadedness. No other concerns or complaints at this time.   Objective: Vitals:   05/29/16 0803 05/29/16 1202 05/29/16 1424 05/29/16 1500  BP:    121/73  Pulse:    68  Resp:    18  Temp:    98.1 F (36.7 C)  TempSrc:      SpO2: 100% 99% 96% 93%  Weight:      Height:        Intake/Output Summary (Last 24 hours) at 05/29/16 1739 Last data filed at 05/29/16 1710  Gross per 24 hour  Intake              800 ml  Output             2200 ml  Net            -1400 ml   Filed Weights   05/26/16 1114  Weight: 54.4 kg (120 lb)   Examination: Physical Exam:  Constitutional:  NAD and appears calm and comfortable Eyes: Lids and conjunctivae normal, sclerae anicteric  ENMT: External Ears, Nose appear normal.  Neck: Appears normal, supple, no cervical masses, normal ROM, no appreciable thyromegaly Respiratory: Diminished to auscultation bilaterally with audible expiratory wheezing (worse when patient forces breaths), No rales, rhonchi or crackles. Slightly increased respiratory effort. No accessory muscle use. Patient wearing O2 via [place].   Cardiovascular: RRR, no murmurs / rubs / gallops. S1 and S2 auscultated. 1+ Lower extremity edema.  Abdomen: Soft, non-tender, non-distended. No masses palpated. No appreciable hepatosplenomegaly. Bowel sounds positive.  GU: Deferred. Musculoskeletal: No clubbing / cyanosis of digits/nails. No joint deformity upper  and lower extremities.  Skin: Left small posterior calf ulceration with erythema with edema; Left foot has less erythema but same amout of swelling  Neurologic: CN 2-12 grossly intact with no focal deficits. Sensation intact in all 4 Extremities,Romberg sign cerebellar reflexes not assessed.  Psychiatric: Normal judgment and insight. Alert and oriented x 3. Normal mood and appropriate affect.   Data Reviewed: I have personally reviewed following labs and imaging studies  CBC:  Recent Labs Lab 05/26/16 1119 05/27/16 0434 05/28/16 0435 05/29/16 0513  WBC 8.7 6.6 9.7 6.3  NEUTROABS  --   --   --  4.3  HGB 8.9* 8.2* 8.2* 9.2*  HCT 28.1* 26.9* 27.3* 28.9*  MCV 97.6 100.0 100.7* 98.0  PLT 104* 115* 187 237   Basic Metabolic Panel:  Recent Labs Lab 05/26/16 1119 05/27/16 0434 05/28/16 0435 05/28/16 1423 05/29/16 0513  NA 138 135 133* 133* 135  K 2.8* 4.8 5.6* 4.5 4.3  CL 105 103  103 100* 104  CO2 24 23 24 24 22   GLUCOSE 126* 182* 106* 93 117*  BUN 14 24* 31* 30* 26*  CREATININE 0.95 1.62* 1.64* 1.43* 1.24*  CALCIUM 7.9* 7.7* 8.0* 8.0* 7.4*  MG  --  1.3*  --   --  1.2*  PHOS  --   --   --   --  3.7   GFR: Estimated Creatinine Clearance: 37.8 mL/min (A) (by C-G formula based on SCr of 1.24 mg/dL (H)). Liver Function Tests:  Recent Labs Lab 05/29/16 0513  AST 14*  ALT 24  ALKPHOS 97  BILITOT 0.3  PROT 5.0*  ALBUMIN 2.6*   No results for input(s): LIPASE, AMYLASE in the last 168 hours. No results for input(s): AMMONIA in the last 168 hours. Coagulation Profile: No results for input(s): INR, PROTIME in the last 168 hours. Cardiac Enzymes: No results for input(s): CKTOTAL, CKMB, CKMBINDEX, TROPONINI in the last 168 hours. BNP (last 3 results) No results for input(s): PROBNP in the last 8760 hours. HbA1C: No results for input(s): HGBA1C in the last 72 hours. CBG:  Recent Labs Lab 05/26/16 1248  GLUCAP 86   Lipid Profile: No results for input(s): CHOL, HDL,  LDLCALC, TRIG, CHOLHDL, LDLDIRECT in the last 72 hours. Thyroid Function Tests: No results for input(s): TSH, T4TOTAL, FREET4, T3FREE, THYROIDAB in the last 72 hours. Anemia Panel: No results for input(s): VITAMINB12, FOLATE, FERRITIN, TIBC, IRON, RETICCTPCT in the last 72 hours. Sepsis Labs: No results for input(s): PROCALCITON, LATICACIDVEN in the last 168 hours.  No results found for this or any previous visit (from the past 240 hour(s)).   Radiology Studies: No results found. Scheduled Meds: . diltiazem  240 mg Oral Daily  . guaiFENesin  1,200 mg Oral BID  . ipratropium-albuterol  3 mL Inhalation Q6H  . mouth rinse  15 mL Mouth Rinse BID  . methylPREDNISolone (SOLU-MEDROL) injection  40 mg Intravenous Q12H  . metoprolol  50 mg Oral BID  . mometasone-formoterol  2 puff Inhalation BID  . pantoprazole  40 mg Oral Daily  . Rivaroxaban  15 mg Oral Q supper  . sodium chloride HYPERTONIC  4 mL Nebulization Daily   Continuous Infusions: . clindamycin (CLEOCIN) IV Stopped (05/29/16 1710)    LOS: 3 days   Kerney Elbe, DO [facility] Pager [phone]  If 7PM-7AM, please contact night-coverage [email or web address] Password [facility] 05/29/2016, 5:39 PM

## 2016-05-29 NOTE — Evaluation (Signed)
Physical Therapy Evaluation Patient Details Name: Lindsay Salazar MRN: [id number] DOB: 03/27/1950 Today's Date: 05/29/2016   History of Present Illness  66 y.o. female with medical history significant of oxygen dependent COPD, atrial fibrillation on anticoagulation, presents to the hospital with complaints of shortness of breath. She reports that yesterday she was in her usual state of health. Overnight she became increasingly short of breath, coughing and wheezing. She is using nebulizer without significant improvement. She denies any fever. She does have a productive cough. No vomiting or diarrhea. She also notes that overnight she has developed redness, swelling and pain in her left lower extremity. She did not notice any discharge from this area.  Clinical Impression  Pt received in bed, and was agreeable to PT evaluation.  Pt states that she is independent with ambulating short household distances while holding onto things in the house.  She is independent for dressing, and bathing - but she has to go to her sister's boyfriend's house to shower because theirs does not work.  During today's PT evaluation she was able to ambulate 43ft with no device, but holding to objects in the room, with extremely slow cadence.  She is recommended for SNF vs HHPT, however she states that she has several big dogs that bite, and she is not able to get [facility] because of that.  Pt initially states "NO" to SNF, however she then states she would think on it.   She would benefit from SNF due to multiple (9) admissions in the past 6 months with decrease in functional mobility, noted throughout.     Follow Up Recommendations Home health PT;SNF (Pt states that she cannot have HHPT because she has big dogs that bite. )    Equipment Recommendations  Wheelchair (measurements PT)    Recommendations for Other Services       Precautions / Restrictions Precautions Precautions: Fall Precaution Comments: Pt states that she had 1  fall at the front steps.  Restrictions Weight Bearing Restrictions: No      Mobility  Bed Mobility Overal bed mobility: Modified Independent Bed Mobility: Supine to Sit;Sit to Supine     Supine to sit: Modified independent (Device/Increase time) Sit to supine: Modified independent (Device/Increase time)      Transfers Overall transfer level: Needs assistance Equipment used: None Transfers: Sit to/from Stand Sit to Stand: Min guard         General transfer comment: Pt is unsteady, reaching out for objects to hold onto in the room.   Ambulation/Gait Ambulation/Gait assistance: Min guard Ambulation Distance (Feet): 20 Feet Assistive device: None Gait Pattern/deviations: Step-to pattern   Gait velocity interpretation: <1.8 ft/sec, indicative of risk for recurrent falls General Gait Details: Furniture walking in the room, holding onto objects throughout.  Very slow cadence.    Stairs            Wheelchair Mobility    Modified Rankin (Stroke Patients Only)       Balance Overall balance assessment: History of Falls;Needs assistance Sitting-balance support: Feet supported;Bilateral upper extremity supported Sitting balance-Leahy Scale: Good     Standing balance support: Single extremity supported Standing balance-Leahy Scale: Fair                               Pertinent Vitals/Pain Pain Assessment: 0-10 Pain Score: 8  Pain Location: B feet Pain Descriptors / Indicators: Sharp Pain Intervention(s): Limited activity within patient's tolerance;Monitored during session;Repositioned  Home Living   Living Arrangements: Spouse/significant other (boyfriend) Available Help at Discharge: Available 24 hours/day Type of Home: House Home Access: Stairs to enter   [employer] of Steps: 3 Home Layout: One level Home Equipment: Clinical cytogeneticist - 2 wheels;Bedside commode;Other (comment) (2L of O2)      Prior Function     Gait /  Transfers Assistance Needed: Pt states that she is a furniture walker to get around the house.   ADL's / Homemaking Assistance Needed: Pt is independent with dressing and she takes a shower at her boyfriends sisters house because her shower is not working.          Hand Dominance   Dominant Hand: Right    Extremity/Trunk Assessment   Upper Extremity Assessment Upper Extremity Assessment: Generalized weakness    Lower Extremity Assessment Lower Extremity Assessment: Generalized weakness       Communication   Communication: No difficulties  Cognition Arousal/Alertness: Awake/alert Behavior During Therapy: WFL for tasks assessed/performed Overall Cognitive Status: Within Functional Limits for tasks assessed                                        General Comments      Exercises     Assessment/Plan    PT Assessment Patient needs continued PT services  PT Problem List Decreased strength;Decreased activity tolerance;Decreased balance;Decreased mobility;Decreased safety awareness;Cardiopulmonary status limiting activity       PT Treatment Interventions DME instruction;Gait training;Functional mobility training;Therapeutic activities;Therapeutic exercise;Patient/family education    PT Goals (Current goals can be found in the Care Plan section)  Acute Rehab PT Goals Patient Stated Goal: To get rid of the infection.  PT Goal Formulation: With patient Time For Goal Achievement: 06/12/16 Potential to Achieve Goals: Poor    Frequency Min 3X/week   Barriers to discharge        Co-evaluation               End of Session Equipment Utilized During Treatment: Oxygen;Gait belt Activity Tolerance: Patient limited by fatigue Patient left: in bed;with call bell/phone within reach Nurse Communication: Mobility status (mobility sheet is left up in the room. ) PT Visit Diagnosis: Muscle weakness (generalized) (M62.81);Other abnormalities of gait and  mobility (R26.89)    Time: 1610-9604 PT Time Calculation (min) (ACUTE ONLY): 21 min   Charges:   PT Evaluation $PT Eval Low Complexity: 1 Procedure     PT G Codes:   PT G-Codes **NOT FOR INPATIENT CLASS** Functional Assessment Tool Used: AM-PAC 6 Clicks Basic Mobility Functional Limitation: Mobility: Walking and moving around Mobility: Walking and Moving Around Current Status (V4098): At least 20 percent but less than 40 percent impaired, limited or restricted Mobility: Walking and Moving Around Goal Status [phone]): At least 1 percent but less than 20 percent impaired, limited or restricted    Beth Gustavo Dispenza, PT, DPT X: [phone]

## 2016-05-29 NOTE — Progress Notes (Signed)
PT Cancellation Note  Patient Details Name: LORIE CLECKLEY MRN: [id number] DOB: 1950/02/29   Cancelled Treatment:    Reason Eval/Treat Not Completed: Patient declined, no reason specified (Attempted to perform PT evaluation.  pt declined, stating that she just finished eating, and requested that PT return after 2pm. )  Beth Gemini Bunte, PT, DPT X: [phone]

## 2016-05-30 ENCOUNTER — Inpatient Hospital Stay (HOSPITAL_COMMUNITY): Payer: Medicare HMO

## 2016-05-30 DIAGNOSIS — R0600 Dyspnea, unspecified: Secondary | ICD-10-CM

## 2016-05-30 DIAGNOSIS — M7989 Other specified soft tissue disorders: Secondary | ICD-10-CM

## 2016-05-30 LAB — COMPREHENSIVE METABOLIC PANEL
ALBUMIN: 2.9 g/dL — AB (ref 3.5–5.0)
ALT: 23 U/L (ref 14–54)
ANION GAP: 10 (ref 5–15)
AST: 18 U/L (ref 15–41)
Alkaline Phosphatase: 111 U/L (ref 38–126)
BUN: 25 mg/dL — AB (ref 6–20)
CALCIUM: 8.1 mg/dL — AB (ref 8.9–10.3)
CHLORIDE: 98 mmol/L — AB (ref 101–111)
CO2: 23 mmol/L (ref 22–32)
CREATININE: 1.28 mg/dL — AB (ref 0.44–1.00)
GFR calc Af Amer: 49 mL/min — ABNORMAL LOW (ref >60)
GFR calc non Af Amer: 42 mL/min — ABNORMAL LOW (ref >60)
GLUCOSE: 198 mg/dL — AB (ref 65–99)
POTASSIUM: 4.7 mmol/L (ref 3.5–5.1)
SODIUM: 131 mmol/L — AB (ref 135–145)
Total Bilirubin: 0.4 mg/dL (ref 0.3–1.2)
Total Protein: 5.7 g/dL — ABNORMAL LOW (ref 6.5–8.1)

## 2016-05-30 LAB — ECHOCARDIOGRAM COMPLETE
AOVTI: 21 cm
AV Area mean vel: 2.62 cm2
AV VEL mean LVOT/AV: 1.03
AV vel: 2.01
AVA: 2.01 cm2
AVAREAMEANVIN: 1.67 cm2/m2
AVAREAVTI: 2.32 cm2
AVAREAVTIIND: 1.28 cm2/m2
AVG: 3 mmHg
AVLVOTPG: 5 mmHg
AVPG: 5 mmHg
AVPKVEL: 117 cm/s
Ao pk vel: 0.91 m/s
CHL CUP AV PEAK INDEX: 1.48
CHL CUP AV VALUE AREA INDEX: 1.28
CHL CUP DOP CALC LVOT VTI: 16.6 cm
CHL CUP RV SYS PRESS: 38 mmHg
CHL CUP TV REG PEAK VELOCITY: 296 cm/s
DOP CAL AO MEAN VELOCITY: 66.4 cm/s
EWDT: 162 ms
FS: 37 % (ref 28–44)
Height: 64 in
IV/PV OW: 0.96
LA ID, A-P, ES: 43 mm
LA diam index: 2.74 cm/m2
LAVOLA4C: 61.8 mL
LEFT ATRIUM END SYS DIAM: 43 mm
LV dias vol index: 32 mL/m2
LV dias vol: 50 mL (ref 46–106)
LV sys vol: 16 mL (ref 14–42)
LVOT area: 2.54 cm2
LVOT diameter: 18 mm
LVOT peak vel: 107 cm/s
LVOTSV: 42 mL
LVOTVTI: 0.79 cm
LVSYSVOLIN: 10 mL/m2
MV Dec: 162
MV Peak grad: 5 mmHg
MV pk E vel: 114 m/s
PW: 11.2 mm — AB (ref 0.6–1.1)
Simpson's disk: 69
Stroke v: 34 ml
TAPSE: 16.6 mm
TR max vel: 296 cm/s
VTI: 182 cm
Weight: 1920 oz

## 2016-05-30 LAB — CBC WITH DIFFERENTIAL/PLATELET
BASOS ABS: 0 10*3/uL (ref 0.0–0.1)
BASOS PCT: 0 %
EOS ABS: 0 10*3/uL (ref 0.0–0.7)
EOS PCT: 0 %
HCT: 30.1 % — ABNORMAL LOW (ref 36.0–46.0)
Hemoglobin: 9.6 g/dL — ABNORMAL LOW (ref 12.0–15.0)
Lymphocytes Relative: 10 %
Lymphs Abs: 0.4 10*3/uL — ABNORMAL LOW (ref 0.7–4.0)
MCH: 30.7 pg (ref 26.0–34.0)
MCHC: 31.9 g/dL (ref 30.0–36.0)
MCV: 96.2 fL (ref 78.0–100.0)
MONO ABS: 0.1 10*3/uL (ref 0.1–1.0)
Monocytes Relative: 4 %
NEUTROS ABS: 3.4 10*3/uL (ref 1.7–7.7)
Neutrophils Relative %: 86 %
PLATELETS: 310 10*3/uL (ref 150–400)
RBC: 3.13 MIL/uL — ABNORMAL LOW (ref 3.87–5.11)
RDW: 16.5 % — AB (ref 11.5–15.5)
WBC: 3.9 10*3/uL — ABNORMAL LOW (ref 4.0–10.5)

## 2016-05-30 LAB — MAGNESIUM: Magnesium: 2.3 mg/dL (ref 1.7–2.4)

## 2016-05-30 LAB — PHOSPHORUS: Phosphorus: 3.5 mg/dL (ref 2.5–4.6)

## 2016-05-30 MED ORDER — FUROSEMIDE 10 MG/ML IJ SOLN
40.0000 mg | Freq: Once | INTRAMUSCULAR | Status: AC
  Administered 2016-05-30: 40 mg via INTRAVENOUS
  Filled 2016-05-30: qty 4

## 2016-05-30 NOTE — Progress Notes (Signed)
PROGRESS NOTE    WANNA GULLY  PHX:[id number] DOB: 1950/10/22 DOA: 05/26/2016 PCP: [name], PA-C   Brief Narrative: Lindsay Salazar is a 66 y.o. female with medical history significant of oxygen dependent COPD, atrial fibrillation on anticoagulation, who presented to the hospital with complaints of shortness of breath. She reports that yesterday she was in her usual state of health. Overnight she became increasingly short of breath, coughing and wheezing. She was using nebulizer without significant improvement. She denies any fever. She does have a productive cough. No vomiting or diarrhea. She also notes that overnight she has developed redness, swelling and pain in her left lower extremity. She did not notice any discharge from this area. Was admitted for Cellulitis and COPD exacerbation. She was hyperkalemic yesterday but improved after kayexalate and IVF rehydration. Wheezing appeared worse yesterday so patient was placed on IV Methylprednisolone. Checked ECHO today and gave 1x dose of IV Lasix. Patient states she is feeling stronger and felt she did well ambulating yesterday.     Assessment & Plan:   Active Problems:   Atrial fibrillation (HCC)   Pancreatic cancer (HCC)   Anemia of chronic disease   Essential hypertension   Cellulitis   Chronic respiratory failure with hypoxia (HCC)   COPD exacerbation (HCC)   Cellulitis of left foot  1. Cellulitis of her Left Foot.  -C/w Clindamycin IV 600 mg q8h and transition to po -She does have wounds on her lower extremity  -Wound care consulted and appreciate Recc's. -Per [place] nurse would need ABI and follow up by primary care if compression needed to manage LE edema long term; -Recommends single layer of xeroform as non adherent to the left posterior calf wound, secure with kerlix.  Soft silicone foam to the right posterior calf to protect from further injury. -Still swollen extremities but erythema has significantly resolved -Gave IV Lasix  40 mg x1 today because of lower Extremity swelling  2. Acute COPD Exacerbation/Acute Bronchitis.  -Wheezing was worse yesterday so was given IV Methylprednisolone 125 mg once and then 40 mg of IV Methylprednisolone q12h; Continue as improving -Continue Bronchodilators with DuoNeb q6h and Albuterol Nebs q2hprn -C/w Dulera 2 Puff RT IH BID -C/w Guaifenesin 1200 mg po BID -C/w Hypertonic Saline Nebs 4 mL x 3 days -CT Chest showed no evidence of pneumonia. Small to moderate left pleural effusion. There is bronchial wall  thickening most prominent in the left lower lobe. A component of acute bronchitis should be considered. There was also Emphysema and areas of lung scarring with mild interstitial thickening, similar to the prior chest CT. -Also added Cepacol 3 mg Lozenge po prn Sore Throat  3. Chronic Respiratory Failure with Hypoxia.  -Respiratory status appears to be her baseline.  -Continue Home O2 Requirements -Added Steroids for COPD -Continue to monitor  4. Anemia of chronic disease.  -Patient does not report any bloody stools.  -BUN/Cr went from 8.2/27.3 -> 9.2/28.9 -> 9.6/30.1 -She has had epistaxis lately. -Continue to monitor CBC and repeat in AM  5. Atrial Fibrillation.  -C/w Telemetry -Rate controlled on Cardizem 240 mg po Daily and Metoprolol 50 mg po BID.  -Continue Anticoagulation with Xarelto 15 mg po Daily with Supper (CrCl was low).  6. Hypertension.  -Stable.  -Continue current regimen with Diltiazem and Metoprolol  7. Pancreatic Cancer.  -CA 19-9 was elevated at 90 -Undergoing Chemotherapy -Follow-up with Oncology and General Surgery  8. AKI -Patient's BUN/Cr went from 31/1.64 -> 30/1.43 -> 26/1.24 ->  25/1.28 -D/C'd IVF with NS at 75 mL/hr  -Avoid Nephrotoxics and repeat CMP in Am  9. Hyperkalemia, improved -Patient's K+ was 4.7 this AM -Continue to Monitor and Repeat CMP in AM  10. Hyponatremia, -Sodium was 131 -D/C'd IVF with NS at 66mL/hr;  Gave 1x of IV Lasix -Repeat CMP in AM  11. Hypomagnesemia -Mag was 1.2 yesterday and improved to 2.3 -Replete with IV Mag Sulfate 2 grams x2 yesterday -Repeat Mag Level in AM  12. Lower Extremity Swelling -Concern for ? Heart Failure vs. Poor Nutritional Status from Cancer and low Albumin  -Check BNP -ECHOCardiogram done and showed Mild LVH with LVEF 60-65%, indeterminate diastolic function in the setting of apparent atrial flutter. Mild left atrial enlargement. Calcified mitral annulus with mild mitral regurgitation. Sclerotic aortic valve without stenosis. Moderate tricuspid regurgitation with PASP 38 mmHg. Mild right atrial enlargement. -I/O's showed - 900 mL since admit  DVT prophylaxis: Anticoagulated with Xarelto Code Status: FULL CODE Family Communication: No family present at bedside Disposition Plan: Home with Home Health; Patient refuses SNF  Consultants:   None  Procedures:  ECHOCARDIOGRAM Study Conclusions  - Left ventricle: The cavity size was normal. Wall thickness was   increased in a pattern of mild LVH. Systolic function was normal.   The estimated ejection fraction was in the range of 60% to 65%.   Wall motion was normal; there were no regional wall motion   abnormalities. The study is not technically sufficient to allow   evaluation of LV diastolic function. - Aortic valve: Moderately calcified annulus. Trileaflet; mildly   calcified leaflets. Mean gradient (S): 3 mm Hg. - Mitral valve: Calcified annulus. There was mild regurgitation. - Left atrium: The atrium was mildly dilated. - Right atrium: The atrium was mildly dilated. Central venous   pressure (est): 3 mm Hg. - Atrial septum: No defect or patent foramen ovale was identified. - Tricuspid valve: There was moderate regurgitation. - Pulmonary arteries: PA peak pressure: 38 mm Hg (S). - Pericardium, extracardiac: A prominent pericardial fat pad was   present.  Impressions:  - Mild LVH with LVEF  60-65%, indeterminate diastolic function in   the setting of apparent atrial flutter. Mild left atrial   enlargement. Calcified mitral annulus with mild mitral   regurgitation. Sclerotic aortic valve without stenosis. Moderate   tricuspid regurgitation with PASP 38 mmHg. Mild right atrial   enlargement.  Antimicrobials: Anti-infectives    Start     Dose/Rate Route Frequency Ordered Stop   05/26/16 1830  clindamycin (CLEOCIN) IVPB 600 mg     600 mg 100 mL/hr over 30 Minutes Intravenous Every 8 hours 05/26/16 1758       Subjective: Seen and examined at bedside and she feels stronger. No nausea or vomiting. States breathing is also getting better. No other concerns or complaints.    Objective: Vitals:   05/29/16 2200 05/30/16 0124 05/30/16 0449 05/30/16 0552  BP: (!) 122/57  136/71   Pulse: 84  69   Resp: 18  18   Temp: 97.8 F (36.6 C)  98.2 F (36.8 C)   TempSrc: Oral  Oral   SpO2: 100% 98% 98% 98%  Weight:      Height:        Intake/Output Summary (Last 24 hours) at 05/30/16 0756 Last data filed at 05/30/16 0519  Gross per 24 hour  Intake              730 ml  Output  1700 ml  Net             -970 ml   Filed Weights   05/26/16 1114  Weight: 54.4 kg (120 lb)   Examination: Physical Exam:  Constitutional:  NAD and appears calm and comfortable sitting up in bed Eyes: Lids and conjunctivae normal, sclerae anicteric  ENMT: External Ears, Nose appear normal.  Neck: Appears normal, supple, no cervical masses, normal ROM, no appreciable thyromegaly, no visible JVD Respiratory: Diminished to auscultation bilaterally with some wheezing, No rales, rhonchi or crackles. Slightly increased respiratory effort. No accessory muscle use. Patient wearing O2 via [place].   Cardiovascular: RRR, no murmurs / rubs / gallops. S1 and S2 auscultated. 1+ Lower extremity edema.  Abdomen: Soft, non-tender, non-distended. No masses palpated. No appreciable hepatosplenomegaly. Bowel sounds  positive.  GU: Deferred. Musculoskeletal: No clubbing / cyanosis of digits/nails. No joint deformity upper and lower extremities.  Skin: Left small posterior calf ulceration covered with Mepilex. Foot significantly improved erythema. Still has 1+ Lower extremity swelling.  Neurologic: CN 2-12 grossly intact with no focal deficits. Sensation intact in all 4 Extremities. Romberg sign cerebellar reflexes not assessed.  Psychiatric: Normal judgment and insight. Alert and oriented x 3. Normal and pleasant mood and appropriate affect.   Data Reviewed: I have personally reviewed following labs and imaging studies  CBC:  Recent Labs Lab 05/26/16 1119 05/27/16 0434 05/28/16 0435 05/29/16 0513 05/30/16 0700  WBC 8.7 6.6 9.7 6.3 3.9*  NEUTROABS  --   --   --  4.3 3.4  HGB 8.9* 8.2* 8.2* 9.2* 9.6*  HCT 28.1* 26.9* 27.3* 28.9* 30.1*  MCV 97.6 100.0 100.7* 98.0 96.2  PLT 104* 115* 187 218 419   Basic Metabolic Panel:  Recent Labs Lab 05/27/16 0434 05/28/16 0435 05/28/16 1423 05/29/16 0513 05/30/16 0700  NA 135 133* 133* 135 131*  K 4.8 5.6* 4.5 4.3 4.7  CL 103 103 100* 104 98*  CO2 23 24 24 22 23   GLUCOSE 182* 106* 93 117* 198*  BUN 24* 31* 30* 26* 25*  CREATININE 1.62* 1.64* 1.43* 1.24* 1.28*  CALCIUM 7.7* 8.0* 8.0* 7.4* 8.1*  MG 1.3*  --   --  1.2* 2.3  PHOS  --   --   --  3.7 3.5   GFR: Estimated Creatinine Clearance: 36.1 mL/min (A) (by C-G formula based on SCr of 1.28 mg/dL (H)). Liver Function Tests:  Recent Labs Lab 05/29/16 0513 05/30/16 0700  AST 14* 18  ALT 24 23  ALKPHOS 97 111  BILITOT 0.3 0.4  PROT 5.0* 5.7*  ALBUMIN 2.6* 2.9*   No results for input(s): LIPASE, AMYLASE in the last 168 hours. No results for input(s): AMMONIA in the last 168 hours. Coagulation Profile: No results for input(s): INR, PROTIME in the last 168 hours. Cardiac Enzymes: No results for input(s): CKTOTAL, CKMB, CKMBINDEX, TROPONINI in the last 168 hours. BNP (last 3 results) No  results for input(s): PROBNP in the last 8760 hours. HbA1C: No results for input(s): HGBA1C in the last 72 hours. CBG:  Recent Labs Lab 05/26/16 1248  GLUCAP 86   Lipid Profile: No results for input(s): CHOL, HDL, LDLCALC, TRIG, CHOLHDL, LDLDIRECT in the last 72 hours. Thyroid Function Tests: No results for input(s): TSH, T4TOTAL, FREET4, T3FREE, THYROIDAB in the last 72 hours. Anemia Panel: No results for input(s): VITAMINB12, FOLATE, FERRITIN, TIBC, IRON, RETICCTPCT in the last 72 hours. Sepsis Labs: No results for input(s): PROCALCITON, LATICACIDVEN in the last 168 hours.  No results found for this or any previous visit (from the past 240 hour(s)).   Radiology Studies: No results found. Scheduled Meds: . diltiazem  240 mg Oral Daily  . guaiFENesin  1,200 mg Oral BID  . ipratropium-albuterol  3 mL Inhalation Q6H  . mouth rinse  15 mL Mouth Rinse BID  . methylPREDNISolone (SOLU-MEDROL) injection  40 mg Intravenous Q12H  . metoprolol  50 mg Oral BID  . mometasone-formoterol  2 puff Inhalation BID  . pantoprazole  40 mg Oral Daily  . Rivaroxaban  15 mg Oral Q supper  . sodium chloride HYPERTONIC  4 mL Nebulization Daily   Continuous Infusions: . clindamycin (CLEOCIN) IV Stopped (05/30/16 0300)    LOS: 4 days   Kerney Elbe, DO [facility] Pager [phone]  If 7PM-7AM, please contact night-coverage [email or web address] Password [facility] 05/30/2016, 7:56 AM

## 2016-05-30 NOTE — Progress Notes (Signed)
*  PRELIMINARY RESULTS* Echocardiogram 2D Echocardiogram has been performed.  Samuel Germany 05/30/2016, 2:26 PM

## 2016-05-30 NOTE — Progress Notes (Signed)
CSW following up on potential SNF placement per MD request.  Met with pt at bedside. Pt declines SNF, states she plans to return home where she lives with boyfriend and brother.   LCSW updated RN, MD, CM.   Pt declines any other LCSW intervention or needs.   Sharren Bridge, MSW, LCSW Clinical Social Work 05/30/2016 [phone]

## 2016-05-30 NOTE — Care Management Important Message (Signed)
Important Message  Patient Details  Name: NADEZHDA POLLITT MRN: [id number] Date of Birth: 1951/01/08   Medicare Important Message Given:  Yes    Sherald Barge, RN 05/30/2016, 1:16 PM

## 2016-05-30 NOTE — Care Management Note (Signed)
Case Management Note  Patient Details  Name: Lindsay Salazar MRN: [id number] Date of Birth: 1950/09/17  Expected Discharge Date:  05/28/16               Expected Discharge Plan:  Home/Self Care  In-House Referral:  Clinical Social Work  Discharge planning Services  CM Consult  Post Acute Care Choice:  NA Choice offered to:  NA  Status of Service:  Completed, signed off  Additional Comments: Potential DC home in next 24hrs. Pt still refusing HH at DC. Pt refusing WC and states she is cont to work on her ability to walk longer distances. No needs communicated.   Sherald Barge, RN 05/30/2016, 1:16 PM

## 2016-05-31 LAB — CBC WITH DIFFERENTIAL/PLATELET
Basophils Absolute: 0 10*3/uL (ref 0.0–0.1)
Basophils Relative: 0 %
Eosinophils Absolute: 0 10*3/uL (ref 0.0–0.7)
Eosinophils Relative: 0 %
HEMATOCRIT: 29.6 % — AB (ref 36.0–46.0)
HEMOGLOBIN: 9.4 g/dL — AB (ref 12.0–15.0)
LYMPHS ABS: 0.7 10*3/uL (ref 0.7–4.0)
LYMPHS PCT: 8 %
MCH: 30.9 pg (ref 26.0–34.0)
MCHC: 31.8 g/dL (ref 30.0–36.0)
MCV: 97.4 fL (ref 78.0–100.0)
MONOS PCT: 8 %
Monocytes Absolute: 0.7 10*3/uL (ref 0.1–1.0)
NEUTROS PCT: 84 %
Neutro Abs: 7.4 10*3/uL (ref 1.7–7.7)
Platelets: 387 10*3/uL (ref 150–400)
RBC: 3.04 MIL/uL — ABNORMAL LOW (ref 3.87–5.11)
RDW: 17 % — ABNORMAL HIGH (ref 11.5–15.5)
WBC: 8.7 10*3/uL (ref 4.0–10.5)

## 2016-05-31 LAB — COMPREHENSIVE METABOLIC PANEL
ALK PHOS: 103 U/L (ref 38–126)
ALT: 21 U/L (ref 14–54)
ANION GAP: 7 (ref 5–15)
AST: 19 U/L (ref 15–41)
Albumin: 2.7 g/dL — ABNORMAL LOW (ref 3.5–5.0)
BILIRUBIN TOTAL: 0.3 mg/dL (ref 0.3–1.2)
BUN: 27 mg/dL — ABNORMAL HIGH (ref 6–20)
CALCIUM: 8.1 mg/dL — AB (ref 8.9–10.3)
CO2: 24 mmol/L (ref 22–32)
CREATININE: 1.22 mg/dL — AB (ref 0.44–1.00)
Chloride: 102 mmol/L (ref 101–111)
GFR calc Af Amer: 52 mL/min — ABNORMAL LOW (ref >60)
GFR calc non Af Amer: 44 mL/min — ABNORMAL LOW (ref >60)
GLUCOSE: 187 mg/dL — AB (ref 65–99)
Potassium: 4.4 mmol/L (ref 3.5–5.1)
Sodium: 133 mmol/L — ABNORMAL LOW (ref 135–145)
Total Protein: 5.2 g/dL — ABNORMAL LOW (ref 6.5–8.1)

## 2016-05-31 LAB — MAGNESIUM: Magnesium: 1.8 mg/dL (ref 1.7–2.4)

## 2016-05-31 LAB — PHOSPHORUS: Phosphorus: 3 mg/dL (ref 2.5–4.6)

## 2016-05-31 MED ORDER — HYDROCODONE-ACETAMINOPHEN 10-325 MG PO TABS
1.0000 | ORAL_TABLET | ORAL | Status: DC | PRN
  Administered 2016-05-31 – 2016-06-02 (×12): 1 via ORAL
  Filled 2016-05-31 (×12): qty 1

## 2016-05-31 MED ORDER — FUROSEMIDE 10 MG/ML IJ SOLN
40.0000 mg | Freq: Once | INTRAMUSCULAR | Status: AC
  Administered 2016-05-31: 40 mg via INTRAVENOUS
  Filled 2016-05-31: qty 4

## 2016-05-31 NOTE — Progress Notes (Signed)
PROGRESS NOTE    Lindsay Salazar  KPT:[id number] DOB: May 29, 1950 DOA: 05/26/2016 PCP: [name], PA-C   Brief Narrative: Lindsay Salazar is a 66 y.o. female with medical history significant of oxygen dependent COPD, atrial fibrillation on anticoagulation, who presented to the hospital with complaints of shortness of breath. She reports that yesterday she was in her usual state of health. Overnight she became increasingly short of breath, coughing and wheezing. She was using nebulizer without significant improvement. She denies any fever. She does have a productive cough. No vomiting or diarrhea. She also notes that overnight she has developed redness, swelling and pain in her left lower extremity. She did not notice any discharge from this area. Was admitted for Cellulitis and COPD exacerbation. She was hyperkalemic yesterday but improved after kayexalate and IVF rehydration. Wheezing appeared worse yesterday so patient was placed on IV Methylprednisolone. Checked ECHO yesterday and gave 1x dose of IV Lasix. Patient states she felt bad today and legs were hurting. Gave another 1x dose of IV Lasix this AM and increased frequency of prn Pain meds.   Assessment & Plan:   Active Problems:   Atrial fibrillation (HCC)   Pancreatic cancer (HCC)   Anemia of chronic disease   Essential hypertension   Cellulitis   Chronic respiratory failure with hypoxia (HCC)   COPD exacerbation (HCC)   Cellulitis of left foot  1. Cellulitis of her Left Foot.  -C/w Clindamycin IV 600 mg q8h and transition to po in AM -She does have wounds on her lower extremity  -Wound care consulted and appreciate Recc's. -Per [facility] nurse would need ABI and follow up by primary care if compression needed to manage LE edema long term; -Recommends single layer of xeroform as non adherent to the left posterior calf wound, secure with kerlix.  Soft silicone foam to the right posterior calf to protect from further injury. -Still swollen  extremities but erythema has significantly resolved -Gave another IV Lasix 40 mg x1 today because of lower Extremity swelling -Legs wrapped today.   2. Acute COPD Exacerbation/Acute Bronchitis.  -Given IV Methylprednisolone 125 mg once and then 40 mg of IV Methylprednisolone q12h; Continue as improving and switch to po Prednisone in AM -Continue Bronchodilators with DuoNeb q6h and Albuterol Nebs q2hprn -C/w Dulera 2 Puff RT IH BID -C/w Guaifenesin 1200 mg po BID -C/w Hypertonic Saline Nebs 4 mL x 3 days -CT Chest showed no evidence of pneumonia. Small to moderate left pleural effusion. There is bronchial wall  thickening most prominent in the left lower lobe. A component of acute bronchitis should be considered. There was also Emphysema and areas of lung scarring with mild interstitial thickening, similar to the prior chest CT. -Also added Cepacol 3 mg Lozenge po prn Sore Throat  3. Chronic Respiratory Failure with Hypoxia.  -Respiratory status appears to be her baseline.  -Continue Home O2 Requirements -Added Steroids for COPD -Continue to monitor  4. Anemia of chronic disease.  -Patient does not report any bloody stools.  -BUN/Cr went from 8.2/27.3 -> 9.2/28.9 -> 9.6/30.1 -> 9.4/29.6 -She has had epistaxis lately. -Continue to monitor CBC and repeat in AM  5. Atrial Fibrillation.  -C/w Telemetry -Rate controlled on Cardizem 240 mg po Daily and Metoprolol 50 mg po BID.  -Continue Anticoagulation with Xarelto 15 mg po Daily with Supper (CrCl was low).  6. Hypertension.  -Stable.  -Continue current regimen with Diltiazem and Metoprolol  7. Pancreatic Cancer.  -CA 19-9 was elevated at  90 -Undergoing Chemotherapy -Follow-up with Oncology and General Surgery in the outpatient setting   8. AKI -Patient's BUN/Cr went from 31/1.64 -> 30/1.43 -> 26/1.24 -> 25/1.28 -> 27/1.22 -D/C'd IVF with NS at 75 mL/hr; Gave IV Lasix 40 mg again today -Avoid Nephrotoxics and repeat CMP in  Am  9. Hyperkalemia, improved -Patient's K+ was 4.4 this AM -Continue to Monitor and Repeat CMP in AM  10. Hyponatremia, -Sodium was 133 -D/C'd IVF with NS at 8mL/hr; Gave 1x of IV Lasix again today -Repeat CMP in AM  11. Hypomagnesemia, improved -Mag 1.8 this AM -Replete as necessary  -Repeat Mag Level in AM  12. Lower Extremity Swelling -Concern for ? Heart Failure vs. Poor Nutritional Status from Cancer and low Albumin  -Check BNP as last value on 4/518 was 711 -ECHOCardiogram done and showed Mild LVH with LVEF 60-65%, indeterminate diastolic function in the setting of apparent atrial flutter. Mild left atrial enlargement. Calcified mitral annulus with mild mitral regurgitation. Sclerotic aortic valve without stenosis. Moderate tricuspid regurgitation with PASP 38 mmHg. Mild right atrial enlargement. -Repeat IV Lasix 40 mg x 1  -I/O's showed - 1.8 L since admit  DVT prophylaxis: Anticoagulated with Xarelto Code Status: FULL CODE Family Communication: No family present at bedside Disposition Plan: Home with [place] in AM; Patient refuses SNF  Consultants:   None  Procedures:  ECHOCARDIOGRAM Study Conclusions  - Left ventricle: The cavity size was normal. Wall thickness was   increased in a pattern of mild LVH. Systolic function was normal.   The estimated ejection fraction was in the range of 60% to 65%.   Wall motion was normal; there were no regional wall motion   abnormalities. The study is not technically sufficient to allow   evaluation of LV diastolic function. - Aortic valve: Moderately calcified annulus. Trileaflet; mildly   calcified leaflets. Mean gradient (S): 3 mm Hg. - Mitral valve: Calcified annulus. There was mild regurgitation. - Left atrium: The atrium was mildly dilated. - Right atrium: The atrium was mildly dilated. Central venous   pressure (est): 3 mm Hg. - Atrial septum: No defect or patent foramen ovale was identified. - Tricuspid valve:  There was moderate regurgitation. - Pulmonary arteries: PA peak pressure: 38 mm Hg (S). - Pericardium, extracardiac: A prominent pericardial fat pad was   present.  Impressions:  - Mild LVH with LVEF 60-65%, indeterminate diastolic function in   the setting of apparent atrial flutter. Mild left atrial   enlargement. Calcified mitral annulus with mild mitral   regurgitation. Sclerotic aortic valve without stenosis. Moderate   tricuspid regurgitation with PASP 38 mmHg. Mild right atrial   enlargement.  Antimicrobials: Anti-infectives    Start     Dose/Rate Route Frequency Ordered Stop   05/26/16 1830  clindamycin (CLEOCIN) IVPB 600 mg     600 mg 100 mL/hr over 30 Minutes Intravenous Every 8 hours 05/26/16 1758       Subjective: Seen and awoken from sleep and examined at bedside and stated she didn't feel too good today and was nauseous and had a poor appetite. No CP or SOB. Stated legs hurt today.   Objective: Vitals:   05/31/16 1244 05/31/16 1458 05/31/16 1500 05/31/16 1932  BP:   (!) 116/42   Pulse:   72   Resp:   18   Temp:   98.2 F (36.8 C)   TempSrc:   Oral   SpO2: 99% 97% 97% 98%  Weight:  Height:        Intake/Output Summary (Last 24 hours) at 05/31/16 1952 Last data filed at 05/31/16 1853  Gross per 24 hour  Intake              720 ml  Output             1650 ml  Net             -930 ml   Filed Weights   05/26/16 1114  Weight: 54.4 kg (120 lb)   Examination: Physical Exam:  Constitutional:  NAD and appears calm and comfortable laying in bed Eyes: Lids and conjunctivae normal, sclerae anicteric  ENMT: External Ears, Nose appear normal.  Neck: Appears normal, supple, no cervical masses, normal ROM, no appreciable thyromegaly, no visible JVD Respiratory: Diminished to auscultation bilaterally with some wheezing, No rales, rhonchi or crackles. Slightly increased respiratory effort. No accessory muscle use. Patient wearing O2 via [place].   Cardiovascular:  RRR, no murmurs / rubs / gallops. S1 and S2 auscultated. 1+ Lower extremity edema bilaterally. Left lower leg wrapped  Abdomen: Soft, non-tender, non-distended. No masses palpated. No appreciable hepatosplenomegaly. Bowel sounds positive x4.  GU: Deferred. Musculoskeletal: No clubbing / cyanosis of digits/nails. No joint deformity upper and lower extremities.  Skin: Left lower leg and foot wrapped. Skin was warm and dry. 1+ Lower Extremity bilaterally Neurologic: CN 2-12 grossly intact with no focal deficits. Sensation intact in all 4 Extremities. Romberg sign cerebellar reflexes not assessed.  Psychiatric: Normal judgment and insight. Alert and oriented x 3. Normal and pleasant mood and appropriate affect.   Data Reviewed: I have personally reviewed following labs and imaging studies  CBC:  Recent Labs Lab 05/27/16 0434 05/28/16 0435 05/29/16 0513 05/30/16 0700 05/31/16 0717  WBC 6.6 9.7 6.3 3.9* 8.7  NEUTROABS  --   --  4.3 3.4 7.4  HGB 8.2* 8.2* 9.2* 9.6* 9.4*  HCT 26.9* 27.3* 28.9* 30.1* 29.6*  MCV 100.0 100.7* 98.0 96.2 97.4  PLT 115* 187 218 310 462   Basic Metabolic Panel:  Recent Labs Lab 05/27/16 0434 05/28/16 0435 05/28/16 1423 05/29/16 0513 05/30/16 0700 05/31/16 0717  NA 135 133* 133* 135 131* 133*  K 4.8 5.6* 4.5 4.3 4.7 4.4  CL 103 103 100* 104 98* 102  CO2 23 24 24 22 23 24   GLUCOSE 182* 106* 93 117* 198* 187*  BUN 24* 31* 30* 26* 25* 27*  CREATININE 1.62* 1.64* 1.43* 1.24* 1.28* 1.22*  CALCIUM 7.7* 8.0* 8.0* 7.4* 8.1* 8.1*  MG 1.3*  --   --  1.2* 2.3 1.8  PHOS  --   --   --  3.7 3.5 3.0   GFR: Estimated Creatinine Clearance: 37.9 mL/min (A) (by C-G formula based on SCr of 1.22 mg/dL (H)). Liver Function Tests:  Recent Labs Lab 05/29/16 0513 05/30/16 0700 05/31/16 0717  AST 14* 18 19  ALT 24 23 21   ALKPHOS 97 111 103  BILITOT 0.3 0.4 0.3  PROT 5.0* 5.7* 5.2*  ALBUMIN 2.6* 2.9* 2.7*   No results for input(s): LIPASE, AMYLASE in the last 168  hours. No results for input(s): AMMONIA in the last 168 hours. Coagulation Profile: No results for input(s): INR, PROTIME in the last 168 hours. Cardiac Enzymes: No results for input(s): CKTOTAL, CKMB, CKMBINDEX, TROPONINI in the last 168 hours. BNP (last 3 results) No results for input(s): PROBNP in the last 8760 hours. HbA1C: No results for input(s): HGBA1C in the last 72 hours.  CBG:  Recent Labs Lab 05/26/16 1248  GLUCAP 86   Lipid Profile: No results for input(s): CHOL, HDL, LDLCALC, TRIG, CHOLHDL, LDLDIRECT in the last 72 hours. Thyroid Function Tests: No results for input(s): TSH, T4TOTAL, FREET4, T3FREE, THYROIDAB in the last 72 hours. Anemia Panel: No results for input(s): VITAMINB12, FOLATE, FERRITIN, TIBC, IRON, RETICCTPCT in the last 72 hours. Sepsis Labs: No results for input(s): PROCALCITON, LATICACIDVEN in the last 168 hours.  No results found for this or any previous visit (from the past 240 hour(s)).   Radiology Studies: No results found. Scheduled Meds: . diltiazem  240 mg Oral Daily  . guaiFENesin  1,200 mg Oral BID  . ipratropium-albuterol  3 mL Inhalation Q6H  . mouth rinse  15 mL Mouth Rinse BID  . methylPREDNISolone (SOLU-MEDROL) injection  40 mg Intravenous Q12H  . metoprolol  50 mg Oral BID  . mometasone-formoterol  2 puff Inhalation BID  . pantoprazole  40 mg Oral Daily  . Rivaroxaban  15 mg Oral Q supper  . sodium chloride HYPERTONIC  4 mL Nebulization Daily   Continuous Infusions: . clindamycin (CLEOCIN) IV Stopped (05/31/16 1008)    LOS: 5 days   Kerney Elbe, DO [facility] Pager [phone]  If 7PM-7AM, please contact night-coverage [email or web address] Password [facility] 05/31/2016, 7:52 PM

## 2016-06-01 LAB — CBC WITH DIFFERENTIAL/PLATELET
BASOS PCT: 0 %
Basophils Absolute: 0 10*3/uL (ref 0.0–0.1)
EOS ABS: 0 10*3/uL (ref 0.0–0.7)
EOS PCT: 0 %
HCT: 31.8 % — ABNORMAL LOW (ref 36.0–46.0)
HEMOGLOBIN: 10 g/dL — AB (ref 12.0–15.0)
Lymphocytes Relative: 5 %
Lymphs Abs: 0.5 10*3/uL — ABNORMAL LOW (ref 0.7–4.0)
MCH: 30.9 pg (ref 26.0–34.0)
MCHC: 31.4 g/dL (ref 30.0–36.0)
MCV: 98.1 fL (ref 78.0–100.0)
Monocytes Absolute: 0.7 10*3/uL (ref 0.1–1.0)
Monocytes Relative: 7 %
NEUTROS PCT: 88 %
Neutro Abs: 9.2 10*3/uL — ABNORMAL HIGH (ref 1.7–7.7)
PLATELETS: 430 10*3/uL — AB (ref 150–400)
RBC: 3.24 MIL/uL — AB (ref 3.87–5.11)
RDW: 17.3 % — ABNORMAL HIGH (ref 11.5–15.5)
WBC: 10.4 10*3/uL (ref 4.0–10.5)

## 2016-06-01 LAB — COMPREHENSIVE METABOLIC PANEL
ALBUMIN: 2.9 g/dL — AB (ref 3.5–5.0)
ALK PHOS: 102 U/L (ref 38–126)
ALT: 22 U/L (ref 14–54)
ANION GAP: 9 (ref 5–15)
AST: 19 U/L (ref 15–41)
BUN: 36 mg/dL — AB (ref 6–20)
CALCIUM: 8.1 mg/dL — AB (ref 8.9–10.3)
CO2: 26 mmol/L (ref 22–32)
CREATININE: 1.34 mg/dL — AB (ref 0.44–1.00)
Chloride: 100 mmol/L — ABNORMAL LOW (ref 101–111)
GFR calc non Af Amer: 40 mL/min — ABNORMAL LOW (ref >60)
GFR, EST AFRICAN AMERICAN: 46 mL/min — AB (ref >60)
Glucose, Bld: 181 mg/dL — ABNORMAL HIGH (ref 65–99)
Potassium: 4.3 mmol/L (ref 3.5–5.1)
SODIUM: 135 mmol/L (ref 135–145)
TOTAL PROTEIN: 5.5 g/dL — AB (ref 6.5–8.1)
Total Bilirubin: 0.4 mg/dL (ref 0.3–1.2)

## 2016-06-01 LAB — MAGNESIUM: Magnesium: 1.8 mg/dL (ref 1.7–2.4)

## 2016-06-01 LAB — PHOSPHORUS: Phosphorus: 4 mg/dL (ref 2.5–4.6)

## 2016-06-01 MED ORDER — FUROSEMIDE 10 MG/ML IJ SOLN
40.0000 mg | Freq: Once | INTRAMUSCULAR | Status: AC
  Administered 2016-06-01: 40 mg via INTRAVENOUS
  Filled 2016-06-01: qty 4

## 2016-06-01 MED ORDER — PREDNISONE 10 MG (21) PO TBPK: 10.0000 mg | ORAL_TABLET | Freq: Three times a day (TID) | ORAL | Status: DC

## 2016-06-01 MED ORDER — PREDNISONE 10 MG (21) PO TBPK: 20.0000 mg | ORAL_TABLET | Freq: Every evening | ORAL | Status: DC

## 2016-06-01 MED ORDER — PREDNISONE 10 MG (21) PO TBPK: 20.0000 mg | ORAL_TABLET | Freq: Every morning | ORAL | Status: DC

## 2016-06-01 MED ORDER — PREDNISONE 10 MG (21) PO TBPK: 10.0000 mg | ORAL_TABLET | Freq: Four times a day (QID) | ORAL | Status: DC

## 2016-06-01 MED ORDER — PREDNISONE 10 MG (21) PO TBPK: 10.0000 mg | ORAL_TABLET | ORAL | Status: DC

## 2016-06-01 MED ORDER — PREDNISONE 20 MG PO TABS
40.0000 mg | ORAL_TABLET | Freq: Every day | ORAL | Status: DC
  Administered 2016-06-02: 40 mg via ORAL
  Filled 2016-06-01: qty 2

## 2016-06-01 MED ORDER — PROCHLORPERAZINE MALEATE 5 MG PO TABS
10.0000 mg | ORAL_TABLET | Freq: Every day | ORAL | Status: DC | PRN
Start: 2016-06-01 — End: 2016-06-02

## 2016-06-01 NOTE — Progress Notes (Signed)
PROGRESS NOTE    Lindsay Salazar  JKD:[id number] DOB: 07/21/1950 DOA: 05/26/2016 PCP: [name], PA-C   Brief Narrative: Lindsay Salazar is a 66 y.o. female with medical history significant of oxygen dependent COPD, atrial fibrillation on anticoagulation, who presented to the hospital with complaints of shortness of breath. She reports that yesterday she was in her usual state of health. Overnight she became increasingly short of breath, coughing and wheezing. She was using nebulizer without significant improvement. She denies any fever. She does have a productive cough. No vomiting or diarrhea. She also notes that overnight she has developed redness, swelling and pain in her left lower extremity. She did not notice any discharge from this area. Was admitted for Cellulitis and COPD exacerbation. She was hyperkalemic yesterday but improved after kayexalate and IVF rehydration. Wheezing appeared worse yesterday so patient was placed on IV Methylprednisolone. Checked ECHO yesterday and gave 1x dose of IV Lasix. Patient states she felt bad today and legs were hurting. Gave another 1x dose of IV Lasix this AM. Patient still complaining of Nausea so starter her back on her prn Promethazine.    Assessment & Plan:   Active Problems:   Atrial fibrillation (HCC)   Pancreatic cancer (HCC)   Anemia of chronic disease   Essential hypertension   Cellulitis   Chronic respiratory failure with hypoxia (HCC)   COPD exacerbation (HCC)   Cellulitis of left foot  1. Cellulitis of her Left Foot.  -C/w Clindamycin IV 600 mg q8h (Day 7); will need to transition to po at D/C -She does have wounds on her lower extremity which are improving  -Wound care consulted and appreciate Recc's. -Per [place] nurse would need ABI and follow up by primary care if compression needed to manage LE edema long term; -Recommends single layer of xeroform as non adherent to the left posterior calf wound, secure with kerlix.  Soft silicone  foam to the right posterior calf to protect from further injury. -Still swollen extremities but erythema has significantly resolved -Gave another IV Lasix 40 mg x1 today because of lower Extremity swelling; Improving    2. Acute COPD Exacerbation/Acute Bronchitis.  -Given IV Methylprednisolone 125 mg once and then 40 mg of IV Methylprednisolone q12h as patient was wheezing;  -Change IV Methylprednisolone to po Prednisone as patient's wheezing has significantly improved -Continue Bronchodilators with DuoNeb q6h and Albuterol Nebs q2hprn -C/w Dulera 2 Puff RT IH BID -C/w Guaifenesin 1200 mg po BID -C/w Hypertonic Saline Nebs 4 mL x 3 days -CT Chest showed no evidence of pneumonia. Small to moderate left pleural effusion. There is bronchial wall  thickening most prominent in the left lower lobe. A component of acute bronchitis should be considered. There was also Emphysema and areas of lung scarring with mild interstitial thickening, similar to the prior chest CT. -Also added Cepacol 3 mg Lozenge po prn Sore Throat  3. Chronic Respiratory Failure with Hypoxia.  -Respiratory status appears to be her baseline.  -Continue Home O2 Requirements -Added Steroids for COPD -Continue to monitor  4. Anemia of chronic disease.  -Patient does not report any bloody stools.  -BUN/Cr went from 8.2/27.3 -> 9.2/28.9 -> 9.6/30.1 -> 9.4/29.6 -> 10.0/31.8 -She has had epistaxis lately. -Continue to monitor CBC and repeat in AM  5. Atrial Fibrillation.  -C/w Telemetry -Rate controlled on Cardizem 240 mg po Daily and Metoprolol 50 mg po BID.  -Continue Anticoagulation with Xarelto 15 mg po Daily with Supper (CrCl was low).  6. Hypertension.  -Stable.  -Continue current regimen with Diltiazem and Metoprolol  7. Pancreatic Cancer.  -CA 19-9 was elevated at 90 -Undergoing Chemotherapy -Follow-up with Oncology and General Surgery in the outpatient setting   8. AKI -Patient's BUN/Cr went from  31/1.64 -> 30/1.43 -> 26/1.24 -> 25/1.28 -> 27/1.22 -> 36/1.34 -D/C'd IVF with NS at 75 mL/hr; Gave IV another Lasix 40 mg again today -Avoid Nephrotoxics and repeat CMP in Am  9. Hyperkalemia, improved -Patient's K+ was 4.3 this AM -Continue to Monitor and Repeat CMP in AM  10. Hyponatremia, -Sodium was 135 -D/C'd IVF with NS at 46mL/hr; Gave 1x of IV Lasix again today -Repeat CMP in AM  11. Hypomagnesemia, improved -Mag 1.8 this AM -Replete as necessary  -Repeat Mag Level in AM  12. Lower Extremity Swelling -Concern for ? Heart Failure vs. Poor Nutritional Status from Cancer and low Albumin  -Check BNP as last value on 4/518 was 711 -ECHOCardiogram done and showed Mild LVH with LVEF 60-65%, indeterminate diastolic function in the setting of apparent atrial flutter. Mild left atrial enlargement. Calcified mitral annulus with mild mitral regurgitation. Sclerotic aortic valve without stenosis. Moderate tricuspid regurgitation with PASP 38 mmHg. Mild right atrial enlargement. -Repeat IV Lasix 40 mg x 1  -I/O's showed - 2 L since admit  13. Chronic Nausea -C/w Zofran po/IV 4 mg q6hprn -Restarted Home Prochlorperazine 10 mg po Daily prn for Refractory N/V  DVT prophylaxis: Anticoagulated with Xarelto Code Status: FULL CODE Family Communication: No family present at bedside Disposition Plan: Patient was going to be D/C'd today but remained Nauseous will D/C Home with Home Health in AM; Patient refuses SNF  Consultants:   None  Procedures:  ECHOCARDIOGRAM Study Conclusions  - Left ventricle: The cavity size was normal. Wall thickness was   increased in a pattern of mild LVH. Systolic function was normal.   The estimated ejection fraction was in the range of 60% to 65%.   Wall motion was normal; there were no regional wall motion   abnormalities. The study is not technically sufficient to allow   evaluation of LV diastolic function. - Aortic valve: Moderately calcified  annulus. Trileaflet; mildly   calcified leaflets. Mean gradient (S): 3 mm Hg. - Mitral valve: Calcified annulus. There was mild regurgitation. - Left atrium: The atrium was mildly dilated. - Right atrium: The atrium was mildly dilated. Central venous   pressure (est): 3 mm Hg. - Atrial septum: No defect or patent foramen ovale was identified. - Tricuspid valve: There was moderate regurgitation. - Pulmonary arteries: PA peak pressure: 38 mm Hg (S). - Pericardium, extracardiac: A prominent pericardial fat pad was   present.  Impressions:  - Mild LVH with LVEF 60-65%, indeterminate diastolic function in   the setting of apparent atrial flutter. Mild left atrial   enlargement. Calcified mitral annulus with mild mitral   regurgitation. Sclerotic aortic valve without stenosis. Moderate   tricuspid regurgitation with PASP 38 mmHg. Mild right atrial   enlargement.  Antimicrobials: Anti-infectives    Start     Dose/Rate Route Frequency Ordered Stop   05/26/16 1830  clindamycin (CLEOCIN) IVPB 600 mg     600 mg 100 mL/hr over 30 Minutes Intravenous Every 8 hours 05/26/16 1758       Subjective: Seen and examined and states she was still feeling bad and nauseous and unable to eat much. Had some leg swelling still and felt weak. No CP or SOB. States wheezing has improved.  Objective: Vitals:   06/01/16 0759 06/01/16 1128 06/01/16 1345 06/01/16 1500  BP:    120/77  Pulse:    (!) 59  Resp:    18  Temp:    97.8 F (36.6 C)  TempSrc:    Oral  SpO2: 98% 95% 99% 100%  Weight:      Height:        Intake/Output Summary (Last 24 hours) at 06/01/16 1738 Last data filed at 06/01/16 1501  Gross per 24 hour  Intake              480 ml  Output             1100 ml  Net             -620 ml   Filed Weights   05/26/16 1114  Weight: 54.4 kg (120 lb)   Examination: Physical Exam:  Constitutional:  NAD and appears calm and comfortable sitting in bed Eyes: Lids and conjunctivae normal,  sclerae anicteric  ENMT: External Ears, Nose appear normal.  Neck: Appears normal, supple, no cervical masses, normal ROM, no appreciable thyromegaly, no visible JVD Respiratory: Diminished to auscultation bilaterally no wheezing today, No rales, rhonchi or crackles. Slightly increased respiratory effort. No accessory muscle use. Patient wearing O2 via [place].   Cardiovascular: RRR, no murmurs / rubs / gallops. S1 and S2 auscultated. 1+ Lower extremity edema bilaterally. Left lower leg wrapped but still swollen.   Abdomen: Soft, non-tender, non-distended. No masses palpated. No appreciable hepatosplenomegaly. Bowel sounds positive x4.  GU: Deferred. Musculoskeletal: No clubbing / cyanosis of digits/nails. No joint deformity upper and lower extremities.  Skin: Left lower leg wrapped. Skin was warm and dry. 1+ Lower Extremity bilaterally Neurologic: CN 2-12 grossly intact with no focal deficits. Sensation intact in all 4 Extremities. Romberg sign cerebellar reflexes not assessed.  Psychiatric: Normal judgment and insight. Alert and oriented x 3. Normal and pleasant mood and flat affect.   Data Reviewed: I have personally reviewed following labs and imaging studies  CBC:  Recent Labs Lab 05/28/16 0435 05/29/16 0513 05/30/16 0700 05/31/16 0717 06/01/16 0702  WBC 9.7 6.3 3.9* 8.7 10.4  NEUTROABS  --  4.3 3.4 7.4 9.2*  HGB 8.2* 9.2* 9.6* 9.4* 10.0*  HCT 27.3* 28.9* 30.1* 29.6* 31.8*  MCV 100.7* 98.0 96.2 97.4 98.1  PLT 187 218 310 387 974*   Basic Metabolic Panel:  Recent Labs Lab 05/27/16 0434  05/28/16 1423 05/29/16 0513 05/30/16 0700 05/31/16 0717 06/01/16 0702  NA 135  < > 133* 135 131* 133* 135  K 4.8  < > 4.5 4.3 4.7 4.4 4.3  CL 103  < > 100* 104 98* 102 100*  CO2 23  < > 24 22 23 24 26   GLUCOSE 182*  < > 93 117* 198* 187* 181*  BUN 24*  < > 30* 26* 25* 27* 36*  CREATININE 1.62*  < > 1.43* 1.24* 1.28* 1.22* 1.34*  CALCIUM 7.7*  < > 8.0* 7.4* 8.1* 8.1* 8.1*  MG 1.3*  --    --  1.2* 2.3 1.8 1.8  PHOS  --   --   --  3.7 3.5 3.0 4.0  < > = values in this interval not displayed. GFR: Estimated Creatinine Clearance: 34.5 mL/min (A) (by C-G formula based on SCr of 1.34 mg/dL (H)). Liver Function Tests:  Recent Labs Lab 05/29/16 0513 05/30/16 0700 05/31/16 0717 06/01/16 0702  AST 14* 18 19 19   ALT 24 23  21 22  ALKPHOS 97 111 103 102  BILITOT 0.3 0.4 0.3 0.4  PROT 5.0* 5.7* 5.2* 5.5*  ALBUMIN 2.6* 2.9* 2.7* 2.9*   No results for input(s): LIPASE, AMYLASE in the last 168 hours. No results for input(s): AMMONIA in the last 168 hours. Coagulation Profile: No results for input(s): INR, PROTIME in the last 168 hours. Cardiac Enzymes: No results for input(s): CKTOTAL, CKMB, CKMBINDEX, TROPONINI in the last 168 hours. BNP (last 3 results) No results for input(s): PROBNP in the last 8760 hours. HbA1C: No results for input(s): HGBA1C in the last 72 hours. CBG:  Recent Labs Lab 05/26/16 1248  GLUCAP 86   Lipid Profile: No results for input(s): CHOL, HDL, LDLCALC, TRIG, CHOLHDL, LDLDIRECT in the last 72 hours. Thyroid Function Tests: No results for input(s): TSH, T4TOTAL, FREET4, T3FREE, THYROIDAB in the last 72 hours. Anemia Panel: No results for input(s): VITAMINB12, FOLATE, FERRITIN, TIBC, IRON, RETICCTPCT in the last 72 hours. Sepsis Labs: No results for input(s): PROCALCITON, LATICACIDVEN in the last 168 hours.  No results found for this or any previous visit (from the past 240 hour(s)).   Radiology Studies: No results found. Scheduled Meds: . diltiazem  240 mg Oral Daily  . guaiFENesin  1,200 mg Oral BID  . ipratropium-albuterol  3 mL Inhalation Q6H  . mouth rinse  15 mL Mouth Rinse BID  . methylPREDNISolone (SOLU-MEDROL) injection  40 mg Intravenous Q12H  . metoprolol  50 mg Oral BID  . mometasone-formoterol  2 puff Inhalation BID  . pantoprazole  40 mg Oral Daily  . Rivaroxaban  15 mg Oral Q supper   Continuous Infusions: .  clindamycin (CLEOCIN) IV 600 mg (06/01/16 1109)    LOS: 6 days   Kerney Elbe, DO [facility] Pager [phone]  If 7PM-7AM, please contact night-coverage [email or web address] Password [facility] 06/01/2016, 5:38 PM

## 2016-06-01 NOTE — Progress Notes (Signed)
Pt calling, requesting tx. Scheduled tx given early instead of PRN. Pt tolerating well. RT will continue to monitor.

## 2016-06-02 LAB — CBC WITH DIFFERENTIAL/PLATELET
BASOS ABS: 0 10*3/uL (ref 0.0–0.1)
Basophils Relative: 0 %
EOS PCT: 0 %
Eosinophils Absolute: 0 10*3/uL (ref 0.0–0.7)
HCT: 31.8 % — ABNORMAL LOW (ref 36.0–46.0)
Hemoglobin: 10 g/dL — ABNORMAL LOW (ref 12.0–15.0)
Lymphocytes Relative: 13 %
Lymphs Abs: 1.2 10*3/uL (ref 0.7–4.0)
MCH: 31 pg (ref 26.0–34.0)
MCHC: 31.4 g/dL (ref 30.0–36.0)
MCV: 98.5 fL (ref 78.0–100.0)
MONOS PCT: 9 %
Monocytes Absolute: 0.8 10*3/uL (ref 0.1–1.0)
NEUTROS PCT: 78 %
Neutro Abs: 7.1 10*3/uL (ref 1.7–7.7)
PLATELETS: 373 10*3/uL (ref 150–400)
RBC: 3.23 MIL/uL — AB (ref 3.87–5.11)
RDW: 17.6 % — AB (ref 11.5–15.5)
WBC: 9.1 10*3/uL (ref 4.0–10.5)

## 2016-06-02 LAB — COMPREHENSIVE METABOLIC PANEL
ALT: 22 U/L (ref 14–54)
AST: 19 U/L (ref 15–41)
Albumin: 2.9 g/dL — ABNORMAL LOW (ref 3.5–5.0)
Alkaline Phosphatase: 82 U/L (ref 38–126)
Anion gap: 8 (ref 5–15)
BUN: 41 mg/dL — ABNORMAL HIGH (ref 6–20)
CHLORIDE: 101 mmol/L (ref 101–111)
CO2: 27 mmol/L (ref 22–32)
Calcium: 8.1 mg/dL — ABNORMAL LOW (ref 8.9–10.3)
Creatinine, Ser: 1.32 mg/dL — ABNORMAL HIGH (ref 0.44–1.00)
GFR, EST AFRICAN AMERICAN: 47 mL/min — AB (ref >60)
GFR, EST NON AFRICAN AMERICAN: 40 mL/min — AB (ref >60)
Glucose, Bld: 130 mg/dL — ABNORMAL HIGH (ref 65–99)
POTASSIUM: 4.8 mmol/L (ref 3.5–5.1)
Sodium: 136 mmol/L (ref 135–145)
Total Bilirubin: 0.4 mg/dL (ref 0.3–1.2)
Total Protein: 5.4 g/dL — ABNORMAL LOW (ref 6.5–8.1)

## 2016-06-02 LAB — MAGNESIUM: MAGNESIUM: 1.8 mg/dL (ref 1.7–2.4)

## 2016-06-02 LAB — PHOSPHORUS: PHOSPHORUS: 4.3 mg/dL (ref 2.5–4.6)

## 2016-06-02 MED ORDER — CLINDAMYCIN HCL 150 MG PO CAPS
300.0000 mg | ORAL_CAPSULE | Freq: Three times a day (TID) | ORAL | Status: DC
  Administered 2016-06-02: 300 mg via ORAL
  Filled 2016-06-02 (×2): qty 2

## 2016-06-02 MED ORDER — PREDNISONE 10 MG (21) PO TBPK: ORAL_TABLET | ORAL | 0 refills | Status: DC

## 2016-06-02 MED ORDER — HEPARIN SOD (PORK) LOCK FLUSH 100 UNIT/ML IV SOLN
500.0000 [IU] | INTRAVENOUS | Status: DC
  Administered 2016-06-02: 500 [IU]

## 2016-06-02 MED ORDER — HEPARIN SOD (PORK) LOCK FLUSH 100 UNIT/ML IV SOLN
500.0000 [IU] | INTRAVENOUS | Status: DC | PRN
  Filled 2016-06-02: qty 5

## 2016-06-02 MED ORDER — CLINDAMYCIN HCL 300 MG PO CAPS: 300.0000 mg | ORAL_CAPSULE | Freq: Three times a day (TID) | ORAL | 0 refills | Status: DC

## 2016-06-02 MED ORDER — GUAIFENESIN ER 600 MG PO TB12: 1200.0000 mg | ORAL_TABLET | Freq: Two times a day (BID) | ORAL | Status: DC

## 2016-06-02 MED ORDER — MENTHOL 3 MG MT LOZG: 1.0000 | LOZENGE | OROMUCOSAL | 12 refills | Status: DC | PRN

## 2016-06-02 NOTE — Progress Notes (Signed)
PRN tx given per pt request. RT will continue to monitor.

## 2016-06-02 NOTE — Care Management Important Message (Signed)
Important Message  Patient Details  Name: Lindsay Salazar MRN: [id number] Date of Birth: April 11, 1950   Medicare Important Message Given:  Yes    Sherald Barge, RN 06/02/2016, 12:15 PM

## 2016-06-02 NOTE — Progress Notes (Signed)
Patient discharging home.  Port de-accessed and flushed with heparin per protocol - WNL.  Reviewed DC instructions and medications.  Instructed to follow up with PCP.  Verbalizes understanding.  No questions at this time.  Awaiting arrival of ride to go home -  In NAD.

## 2016-06-02 NOTE — Discharge Summary (Signed)
Physician Discharge Summary  Lindsay Salazar [facility] JQB:[id number] DOB: 1950-03-22 DOA: 05/26/2016  PCP: Lindsay Fairy, PA-C  Admit date: 05/26/2016 Discharge date: 06/02/2016  Admitted From: Home Disposition:  Home (Patient refused Home Health and SNF)  Recommendations for Outpatient Follow-up:  1. Follow up with PCP in 1-2 weeks 2. Follow up with Oncology as an outpatient 3. Follow up with Cardiology as an outpatient 4. Follow up with Wound Care as an outpatient 5. Follow up with General Surgery in the outpatient setting.  6. Please obtain CMP/CBC in one week  Home Health: YES Equipment/Devices: Wheelchair (Patient refused)  Discharge Condition: Stable CODE STATUS: FULL CODE Diet recommendation: Heart Healthy   Brief/Interim Summary: Lindsay Skelton Durhamis a 66 y.o.femalewith medical history significant of oxygen dependent COPD, atrial fibrillation on anticoagulation, who presented to the hospital with complaints of shortness of breath. She reports that yesterday she was in her usual state of health. Overnight she became increasingly short of breath, coughing and wheezing. She was using nebulizer without significant improvement. She denies any fever. She does have a productive cough. No vomiting or diarrhea. She also notes that overnight she has developed redness, swelling and pain in her left lower extremity. She did not notice any discharge from this area. Was admitted for Cellulitis and COPD exacerbation. She was hyperkalemic during the hospitalization but improved after kayexalate and IVF rehydration. Wheezing appeared worse yesterday so patient was placed on IV Methylprednisolone and now changed to po. Checked ECHO and gave IV Lasix daily for 3 days. Patient states she felt bad today and legs were hurting. Complained of Nausea and restarted on her home Promethazine. Patient improved significantly breathing wise and was treated for her cellulitis. Her legs remain swollen but not as bad. PT evaluated the  patient and recommended SNF/Home Health PT but patient declined both. She was deemed medically stable at this time and will be D/C'd Home and will need to follow up with PCP, Oncology, Cardiology, and General Surgery in the outpatient setting.   Discharge Diagnoses:  Active Problems:   Atrial fibrillation (HCC)   Pancreatic cancer (HCC)   Anemia of chronic disease   Essential hypertension   Cellulitis   Chronic respiratory failure with hypoxia (HCC)   COPD exacerbation (HCC)   Cellulitis of left foot  1. Cellulitis of her Left Foot.  -Changed Clindamycin IV 600 mg q8h (Day 8) to po Clindamycin 300 mg q8h -She does have wounds on her lower extremity which are improving  -Wound care consulted and appreciate Recc's. -Per [place] nurse would need ABI and follow up by primary care if compression needed to manage LE edema long term; -Recommends single layer of xeroform as non adherent to the left posterior calf wound, secure with kerlix. Soft silicone foam to the right posterior calf to protect from further injury. -Still swollen extremities but erythema has significantly resolved -Gave another IV Lasix 40 mg x1 yesterday because of lower Extremity swelling; Improving   -Follow up wound Care as an outpatient along with PCP  2. Acute COPD Exacerbation/Acute Bronchitis.  -Given IV Methylprednisolone 125 mg once and then 40 mg of IV Methylprednisolone q12h as patient was wheezing;  -Change IV Methylprednisolone to po Prednisone as patient's wheezing has significantly improved -Continue Home Bronchodilators  -C/w Dulera 2 Puff RT IH BID -C/w Guaifenesin 1200 mg po BID -Was given Hypertonic Saline Nebs 4 mL x 3 days -CT Chest showed no evidence of pneumonia. Small to moderate left pleural effusion. There is bronchial wall  thickening most prominent in the left lower lobe. A component of acute bronchitis should be considered. There was also Emphysema and areas of lung scarring with mild interstitial  thickening, similar to the prior chest CT. -Also added Cepacol 3 mg Lozenge po prn Sore Throat -PT recommending Home Health PT as patient refused SNF but patient refused Home Health -Follow up with PCP as an outpatient   3. Chronic Respiratory Failure with Hypoxia.  -Respiratory status appears to be her baseline.  -Continue Home O2 Requirements -Changed IV Steroids to po for COPD -Continue to monitor  4. Anemia of chronic disease.  -Patient does not report any bloody stools.  -BUN/Cr went from 8.2/27.3 -> 9.2/28.9 -> 9.6/30.1 -> 9.4/29.6 -> 10.0/31.8 - -She has epistaxis but none lately. -Continue to monitor CBC and repeat as an outpatient  5. Atrial Fibrillation.  -C/w Telemetry -Rate controlled on Cardizem 240 mg po Daily and Metoprolol 50 mg po BID.  -Continue Anticoagulation with Xarelto 15 mg po Daily with Supper (CrCl was low). -Follow up with Cardiology (Has an appointment tomorrow at 3:00)  6. Hypertension.  -Stable.  -Continue current regimen with Diltiazem and Metoprolol  7. Pancreatic Cancer.  -CA 19-9 was elevated at 90 -Undergoing Chemotherapy -Follow-up with Oncology and General Surgery in the outpatient setting   8. AKI -Patient's BUN/Cr went from 31/1.64 -> 30/1.43 -> 26/1.24 -> 25/1.28 -> 27/1.22 -> 36/1.34 -> 41/1.32 -D/C'd IVF with NS at 75 mL/hr; Gave IV another Lasix 40 mg again yesterday -Avoid Nephrotoxics and repeat CMP as an outpatient   9. Hyperkalemia, improved -Patient's K+ was 4.8 this AM -Continue to Monitor and Repeat CMP as an outpatient  10. Hyponatremia, -Sodium was 136 -D/C'd IVF with NS at 61mL/hr; Gave 1x of IV Lasix again yesterday -Repeat CMP as an outpatient   11. Hypomagnesemia, improved -Mag 1.8 this AM -Replete as necessary  -Repeat Mag Level as an outpatient   12. Lower Extremity Swelling -Concern for ? Heart Failure vs. Poor Nutritional Status from Cancer and low Albumin  -Check BNP as last value on 4/518 was  711 -ECHOCardiogram done and showed Mild LVH with LVEF 60-65%, indeterminate diastolic function in the setting of apparent atrial flutter. Mild left atrial enlargement. Calcified mitral annulus with mild mitral regurgitation. Sclerotic aortic valve without stenosis. Moderate tricuspid regurgitation with PASP 38 mmHg. Mild right atrial enlargement. -IV Lasix 40 mg x 1 yesterday -I/O's showed - 2 L since admit -Follow up with Cardiology as an outpatient   13. Chronic Nausea -C/w Zofran po/IV 4 mg q6hprn -Restarted Home Prochlorperazine 10 mg po Daily prn for Refractory N/V -Follow up with Oncology as an outpatient  Discharge Instructions  Discharge Instructions    Call MD for:  difficulty breathing, headache or visual disturbances    Complete by:  As directed    Call MD for:  extreme fatigue    Complete by:  As directed    Call MD for:  persistant dizziness or light-headedness    Complete by:  As directed    Call MD for:  persistant nausea and vomiting    Complete by:  As directed    Call MD for:  redness, tenderness, or signs of infection (pain, swelling, redness, odor or green/yellow discharge around incision site)    Complete by:  As directed    Call MD for:  severe uncontrolled pain    Complete by:  As directed    Call MD for:  temperature >100.4    Complete  by:  As directed    Diet - low sodium heart healthy    Complete by:  As directed    Discharge instructions    Complete by:  As directed    Follow up with PCP Neysa Hotter and with Oncology as an outpatient. Take all medications as prescribed. If symptoms change or worsen please return to the ED for evaluation.   Increase activity slowly    Complete by:  As directed      Allergies as of 06/02/2016      Reactions   Ibuprofen Nausea And Vomiting   Other    Seafood   Penicillins Swelling   Has patient had a PCN reaction causing immediate rash, facial/tongue/throat swelling, SOB or lightheadedness with hypotension:  Yes Has patient had a PCN reaction causing severe rash involving mucus membranes or skin necrosis: No Has patient had a PCN reaction that required hospitalization No Has patient had a PCN reaction occurring within the last 10 years: No If all of the above answers are "NO", then may proceed with Cephalosporin use.   Tiotropium Bromide Monohydrate Itching   Tramadol Nausea And Vomiting      Medication List    STOP taking these medications   gabapentin 100 MG capsule Commonly known as:  NEURONTIN   potassium chloride 20 MEQ/15ML (10%) Soln   predniSONE 10 MG tablet Commonly known as:  DELTASONE Replaced by:  predniSONE 10 MG (21) Tbpk tablet     TAKE these medications   ABRAXANE IV Inject into the vein. Day 1, day 8, day 15, every 28 days   clindamycin 300 MG capsule Commonly known as:  CLEOCIN Take 1 capsule (300 mg total) by mouth every 8 (eight) hours.   COMBIVENT RESPIMAT 20-100 MCG/ACT Aers respimat Generic drug:  Ipratropium-Albuterol Inhale 1 puff into the lungs 2 (two) times daily.   ipratropium-albuterol 0.5-2.5 (3) MG/3ML Soln Commonly known as:  DUONEB Inhale 3 mLs into the lungs every 6 (six) hours as needed (for shortness of breath).   diltiazem 240 MG 24 hr capsule Commonly known as:  CARDIZEM CD Take 1 capsule (240 mg total) by mouth daily.   guaiFENesin 600 MG 12 hr tablet Commonly known as:  MUCINEX Take 2 tablets (1,200 mg total) by mouth 2 (two) times daily.   HYDROcodone-acetaminophen 10-325 MG tablet Commonly known as:  NORCO Take 1 tablet by mouth every 6 (six) hours as needed.   menthol-cetylpyridinium 3 MG lozenge Commonly known as:  CEPACOL Take 1 lozenge (3 mg total) by mouth as needed for sore throat.   metoprolol 50 MG tablet Commonly known as:  LOPRESSOR Take 1 tablet (50 mg total) by mouth 2 (two) times daily.   ondansetron 8 MG tablet Commonly known as:  ZOFRAN Take 1 tablet by mouth daily as needed for nausea/vomiting.    OXYGEN Inhale 2 L into the lungs daily as needed (for breathing).   pantoprazole 40 MG tablet Commonly known as:  PROTONIX Take 1 tablet (40 mg total) by mouth daily.   predniSONE 10 MG (21) Tbpk tablet Commonly known as:  STERAPRED UNI-PAK 21 TAB Take 6 Tablets Day 1; 5 Tablets Day 2, 4 Tablets Day 3, 3 Tablets Day 4, 2 Tablets Day 5, 1 Tablet Day 6, and Stop on Day 7 Replaces:  predniSONE 10 MG tablet   prochlorperazine 10 MG tablet Commonly known as:  COMPAZINE Take 1 tablet by mouth daily as needed for nausea/vomiting.   Rivaroxaban 15 MG Tabs tablet Commonly  known as:  XARELTO Take 1 tablet (15 mg total) by mouth daily with supper.   SYMBICORT 160-4.5 MCG/ACT inhaler Generic drug:  budesonide-formoterol Inhale 1 puff into the lungs 2 (two) times daily.       Allergies  Allergen Reactions  . Ibuprofen Nausea And Vomiting  . Other     Seafood   . Penicillins Swelling    Has patient had a PCN reaction causing immediate rash, facial/tongue/throat swelling, SOB or lightheadedness with hypotension: Yes Has patient had a PCN reaction causing severe rash involving mucus membranes or skin necrosis: No Has patient had a PCN reaction that required hospitalization No Has patient had a PCN reaction occurring within the last 10 years: No If all of the above answers are "NO", then may proceed with Cephalosporin use.   . Tiotropium Bromide Monohydrate Itching  . Tramadol Nausea And Vomiting   Consultations:  None  Procedures/Studies: Dg Chest 2 View  Result Date: 05/26/2016 CLINICAL DATA:  Cough, congestion. Bilateral lower leg swelling for 5 days. EXAM: CHEST  2 VIEW COMPARISON:  05/08/2016 FINDINGS: There is hyperinflation of the lungs compatible with COPD. Small left pleural effusion again noted, stable. Right Port-A-Cath is unchanged. Heart is normal size. No confluent opacities. IMPRESSION: COPD.  Small left pleural effusion, stable. Electronically Signed   By: Rolm Baptise M.D.   On: 05/26/2016 12:21   Dg Chest 2 View  Result Date: 05/08/2016 CLINICAL DATA:  Chest pain with shortness of breath EXAM: CHEST  2 VIEW COMPARISON:  04/01/2016 FINDINGS: Right-sided central venous port with tip projecting over the cavoatrial junction. Small bilateral pleural effusions, left greater than right. No focal consolidation. Stable cardiomediastinal silhouette with atherosclerosis. No pneumothorax. Cervical spine hardware. IMPRESSION: Small bilateral pleural effusions, left greater than right. Streaky atelectasis or infiltrate at the left lung base. Electronically Signed   By: Donavan Foil M.D.   On: 05/08/2016 17:31   Ct Chest Wo Contrast  Result Date: 05/08/2016 CLINICAL DATA:  66 year old female who presents with shortness of breath. She has a history of pancreatic cancer, chronic respiratory failure on 2L home oxygen, COPD, PAF on Xarelto. Woke up this morning with shortness of breath and dull chest pain across the front of her chest, hx of asthma EXAM: CT CHEST WITHOUT CONTRAST TECHNIQUE: Multidetector CT imaging of the chest was performed following the standard protocol without IV contrast. COMPARISON:  Current chest radiograph.  Chest CT, 03/24/2016. FINDINGS: Cardiovascular: Heart top-normal in size. No significant coronary artery calcifications. Great vessels normal caliber. Atherosclerotic calcifications are noted along the thoracic aorta and at the origin of the aortic arch branch vessels. Mediastinum/Nodes: No neck base or axillary masses or adenopathy. No mediastinal or hilar masses or pathologically enlarged lymph nodes. Trachea is widely patent. Esophagus is unremarkable. Lungs/Pleura: Small to moderate-sized left pleural effusion. No right pleural effusion. Moderate emphysema. There is apical pleuroparenchymal scarring. Reticular opacities with mild intervening ground-glass opacities noted in the anterior upper lobes, likely scarring. There is focal opacity in the  posterior inferior left upper lobe lingula, most likely atelectasis. Linear opacity noted at the left lung base is also likely atelectasis. There is bronchial wall thickening bilaterally most evident in the lower lobes, left greater than right. No lung masses or suspicious nodules. No evidence of pneumonia. Mild interstitial thickening is noted in the lower lungs, chronic. No evidence of pulmonary edema. Upper Abdomen: No acute findings. Large left liver lobe cyst measuring 6.7 cm, without change. Musculoskeletal: No fracture or acute finding.  No osteoblastic or osteolytic lesions. Right anterior chest wall Port-A-Cath has its catheter tip in the right atrium. IMPRESSION: 1. No evidence of pneumonia. 2. Small to moderate left pleural effusion. There is bronchial wall thickening most prominent in the left lower lobe. A component of acute bronchitis should be considered. 3. Emphysema and areas of lung scarring with mild interstitial thickening, similar to the prior chest CT. Electronically Signed   By: Lajean Manes M.D.   On: 05/08/2016 18:59    ECHOCARDIOGRAM Study Conclusions  - Left ventricle: The cavity size was normal. Wall thickness was increased in a pattern of mild LVH. Systolic function was normal. The estimated ejection fraction was in the range of 60% to 65%. Wall motion was normal; there were no regional wall motion abnormalities. The study is not technically sufficient to allow evaluation of LV diastolic function. - Aortic valve: Moderately calcified annulus. Trileaflet; mildly calcified leaflets. Mean gradient (S): 3 mm Hg. - Mitral valve: Calcified annulus. There was mild regurgitation. - Left atrium: The atrium was mildly dilated. - Right atrium: The atrium was mildly dilated. Central venous pressure (est): 3 mm Hg. - Atrial septum: No defect or patent foramen ovale was identified. - Tricuspid valve: There was moderate regurgitation. - Pulmonary arteries: PA peak  pressure: 38 mm Hg (S). - Pericardium, extracardiac: A prominent pericardial fat pad was present.  Impressions:  - Mild LVH with LVEF 60-65%, indeterminate diastolic function in the setting of apparent atrial flutter. Mild left atrial enlargement. Calcified mitral annulus with mild mitral regurgitation. Sclerotic aortic valve without stenosis. Moderate tricuspid regurgitation with PASP 38 mmHg. Mild right atrial enlargement.  Subjective: Seen and examined at bedside and was doing better. Was sleeping and resting and awoken when I went into the room. Had no other concerns or complaints at this time and was ready to go home. Per patient breathing is better.   Discharge Exam: Vitals:   06/01/16 2128 06/02/16 0544  BP: 121/73 140/88  Pulse: 85 82  Resp: 17 19  Temp: 98.1 F (36.7 C) 98.3 F (36.8 C)   Vitals:   06/02/16 0536 06/02/16 0544 06/02/16 0712 06/02/16 1111  BP:  140/88    Pulse:  82    Resp:  19    Temp:  98.3 F (36.8 C)    TempSrc:  Oral    SpO2: 93% 100% 94% 98%  Weight:      Height:       General: Pt is alert, not in acute distress Cardiovascular: Irregularly Irregular, S1/S2 +, no rubs, no gallops Respiratory: Diminished breath sounds bilaterally with some wheezing, no rhonchi; Patient not tachypenic or using any accessory muscles to breathe.  Abdominal: Soft, NT, ND, bowel sounds + Extremities: 1+ Lower Edema, no cyanosis  The results of significant diagnostics from this hospitalization (including imaging, microbiology, ancillary and laboratory) are listed below for reference.    Microbiology: No results found for this or any previous visit (from the past 240 hour(s)).   Labs: BNP (last 3 results)  Recent Labs  02/29/16 1341 03/24/16 1750 05/08/16 1645  BNP 223.0* 394.0* 030.0*   Basic Metabolic Panel:  Recent Labs Lab 05/29/16 0513 05/30/16 0700 05/31/16 0717 06/01/16 0702 06/02/16 0418  NA 135 131* 133* 135 136  K 4.3 4.7  4.4 4.3 4.8  CL 104 98* 102 100* 101  CO2 22 23 24 26 27   GLUCOSE 117* 198* 187* 181* 130*  BUN 26* 25* 27* 36* 41*  CREATININE 1.24* 1.28* 1.22*  1.34* 1.32*  CALCIUM 7.4* 8.1* 8.1* 8.1* 8.1*  MG 1.2* 2.3 1.8 1.8 1.8  PHOS 3.7 3.5 3.0 4.0 4.3   Liver Function Tests:  Recent Labs Lab 05/29/16 0513 05/30/16 0700 05/31/16 0717 06/01/16 0702 06/02/16 0418  AST 14* 18 19 19 19   ALT 24 23 21 22 22   ALKPHOS 97 111 103 102 82  BILITOT 0.3 0.4 0.3 0.4 0.4  PROT 5.0* 5.7* 5.2* 5.5* 5.4*  ALBUMIN 2.6* 2.9* 2.7* 2.9* 2.9*   No results for input(s): LIPASE, AMYLASE in the last 168 hours. No results for input(s): AMMONIA in the last 168 hours. CBC:  Recent Labs Lab 05/29/16 0513 05/30/16 0700 05/31/16 0717 06/01/16 0702 06/02/16 0418  WBC 6.3 3.9* 8.7 10.4 9.1  NEUTROABS 4.3 3.4 7.4 9.2* 7.1  HGB 9.2* 9.6* 9.4* 10.0* 10.0*  HCT 28.9* 30.1* 29.6* 31.8* 31.8*  MCV 98.0 96.2 97.4 98.1 98.5  PLT 218 310 387 430* 373   Cardiac Enzymes: No results for input(s): CKTOTAL, CKMB, CKMBINDEX, TROPONINI in the last 168 hours. BNP: Invalid input(s): POCBNP CBG:  Recent Labs Lab 05/26/16 1248  GLUCAP 86   D-Dimer No results for input(s): DDIMER in the last 72 hours. Hgb A1c No results for input(s): HGBA1C in the last 72 hours. Lipid Profile No results for input(s): CHOL, HDL, LDLCALC, TRIG, CHOLHDL, LDLDIRECT in the last 72 hours. Thyroid function studies No results for input(s): TSH, T4TOTAL, T3FREE, THYROIDAB in the last 72 hours.  Invalid input(s): FREET3 Anemia work up No results for input(s): VITAMINB12, FOLATE, FERRITIN, TIBC, IRON, RETICCTPCT in the last 72 hours. Urinalysis    Component Value Date/Time   COLORURINE YELLOW 04/26/2016 1800   APPEARANCEUR HAZY (A) 04/26/2016 1800   LABSPEC 1.017 04/26/2016 1800   PHURINE 5.0 04/26/2016 1800   GLUCOSEU NEGATIVE 04/26/2016 1800   HGBUR NEGATIVE 04/26/2016 1800   BILIRUBINUR NEGATIVE 04/26/2016 1800   KETONESUR  NEGATIVE 04/26/2016 1800   PROTEINUR NEGATIVE 04/26/2016 1800   NITRITE NEGATIVE 04/26/2016 1800   LEUKOCYTESUR SMALL (A) 04/26/2016 1800   Sepsis Labs Invalid input(s): PROCALCITONIN,  WBC,  LACTICIDVEN Microbiology No results found for this or any previous visit (from the past 240 hour(s)).  Time coordinating discharge: 35 minutes  SIGNED:  Kerney Elbe, DO [facility] 06/02/2016, 12:19 PM Pager [phone]  If 7PM-7AM, please contact night-coverage [email or web address] Password [email or web address]

## 2016-06-02 NOTE — Care Management Note (Signed)
Case Management Note  Patient Details  Name: Lindsay Salazar MRN: [id number] Date of Birth: 04/10/1950  Expected Discharge Date:  06/02/16               Expected Discharge Plan:  Home/Self Care  In-House Referral:  Clinical Social Work  Discharge planning Services  CM Consult  Post Acute Care Choice:  NA Choice offered to:  NA  Status of Service:  Completed, signed off  Additional Comments: Pt discharging home today. Cont to refuse WC and HH.  Sherald Barge, RN 06/02/2016, 12:16 PM

## 2016-06-03 ENCOUNTER — Inpatient Hospital Stay (HOSPITAL_COMMUNITY)
Admission: EM | Admit: 2016-06-03 | Discharge: 2016-06-06 | DRG: 194 | Disposition: A | Discharge status: Home / Self Care | Payer: Medicare HMO | Attending: Internal Medicine | Admitting: Internal Medicine

## 2016-06-03 ENCOUNTER — Other Ambulatory Visit: Payer: Self-pay

## 2016-06-03 ENCOUNTER — Encounter (HOSPITAL_COMMUNITY): Payer: Self-pay

## 2016-06-03 ENCOUNTER — Emergency Department (HOSPITAL_COMMUNITY): Payer: Medicare HMO

## 2016-06-03 ENCOUNTER — Ambulatory Visit (HOSPITAL_COMMUNITY): Admission: RE | Admit: 2016-06-03 | Payer: Medicare HMO | Source: Ambulatory Visit

## 2016-06-03 DIAGNOSIS — I4891 Unspecified atrial fibrillation: Secondary | ICD-10-CM | POA: Diagnosis present

## 2016-06-03 DIAGNOSIS — E440 Moderate protein-calorie malnutrition: Secondary | ICD-10-CM | POA: Insufficient documentation

## 2016-06-03 DIAGNOSIS — J189 Pneumonia, unspecified organism: Secondary | ICD-10-CM | POA: Diagnosis present

## 2016-06-03 DIAGNOSIS — I10 Essential (primary) hypertension: Secondary | ICD-10-CM | POA: Diagnosis present

## 2016-06-03 DIAGNOSIS — M797 Fibromyalgia: Secondary | ICD-10-CM | POA: Diagnosis present

## 2016-06-03 DIAGNOSIS — Z886 Allergy status to analgesic agent status: Secondary | ICD-10-CM | POA: Diagnosis not present

## 2016-06-03 DIAGNOSIS — Y95 Nosocomial condition: Secondary | ICD-10-CM | POA: Diagnosis present

## 2016-06-03 DIAGNOSIS — Z6820 Body mass index (BMI) 20.0-20.9, adult: Secondary | ICD-10-CM | POA: Diagnosis not present

## 2016-06-03 DIAGNOSIS — Z79891 Long term (current) use of opiate analgesic: Secondary | ICD-10-CM

## 2016-06-03 DIAGNOSIS — J9611 Chronic respiratory failure with hypoxia: Secondary | ICD-10-CM | POA: Diagnosis not present

## 2016-06-03 DIAGNOSIS — Z87891 Personal history of nicotine dependence: Secondary | ICD-10-CM | POA: Diagnosis not present

## 2016-06-03 DIAGNOSIS — J449 Chronic obstructive pulmonary disease, unspecified: Secondary | ICD-10-CM | POA: Diagnosis present

## 2016-06-03 DIAGNOSIS — Z880 Allergy status to penicillin: Secondary | ICD-10-CM | POA: Diagnosis not present

## 2016-06-03 DIAGNOSIS — D638 Anemia in other chronic diseases classified elsewhere: Secondary | ICD-10-CM | POA: Diagnosis present

## 2016-06-03 DIAGNOSIS — Z9981 Dependence on supplemental oxygen: Secondary | ICD-10-CM

## 2016-06-03 DIAGNOSIS — J440 Chronic obstructive pulmonary disease with acute lower respiratory infection: Secondary | ICD-10-CM | POA: Diagnosis present

## 2016-06-03 DIAGNOSIS — J441 Chronic obstructive pulmonary disease with (acute) exacerbation: Secondary | ICD-10-CM | POA: Diagnosis present

## 2016-06-03 DIAGNOSIS — Z981 Arthrodesis status: Secondary | ICD-10-CM

## 2016-06-03 DIAGNOSIS — Z792 Long term (current) use of antibiotics: Secondary | ICD-10-CM

## 2016-06-03 DIAGNOSIS — Z91013 Allergy to seafood: Secondary | ICD-10-CM | POA: Diagnosis not present

## 2016-06-03 DIAGNOSIS — I482 Chronic atrial fibrillation: Secondary | ICD-10-CM | POA: Diagnosis not present

## 2016-06-03 DIAGNOSIS — J181 Lobar pneumonia, unspecified organism: Secondary | ICD-10-CM | POA: Diagnosis not present

## 2016-06-03 DIAGNOSIS — Z7952 Long term (current) use of systemic steroids: Secondary | ICD-10-CM | POA: Diagnosis not present

## 2016-06-03 DIAGNOSIS — Z79899 Other long term (current) drug therapy: Secondary | ICD-10-CM

## 2016-06-03 DIAGNOSIS — M1990 Unspecified osteoarthritis, unspecified site: Secondary | ICD-10-CM | POA: Diagnosis present

## 2016-06-03 DIAGNOSIS — R0600 Dyspnea, unspecified: Secondary | ICD-10-CM

## 2016-06-03 DIAGNOSIS — Z7901 Long term (current) use of anticoagulants: Secondary | ICD-10-CM | POA: Diagnosis not present

## 2016-06-03 DIAGNOSIS — Z888 Allergy status to other drugs, medicaments and biological substances status: Secondary | ICD-10-CM

## 2016-06-03 DIAGNOSIS — C259 Malignant neoplasm of pancreas, unspecified: Secondary | ICD-10-CM | POA: Diagnosis present

## 2016-06-03 DIAGNOSIS — C254 Malignant neoplasm of endocrine pancreas: Secondary | ICD-10-CM | POA: Diagnosis not present

## 2016-06-03 LAB — COMPREHENSIVE METABOLIC PANEL
ALBUMIN: 3.1 g/dL — AB (ref 3.5–5.0)
ALK PHOS: 86 U/L (ref 38–126)
ALT: 23 U/L (ref 14–54)
ANION GAP: 8 (ref 5–15)
AST: 24 U/L (ref 15–41)
BILIRUBIN TOTAL: 0.7 mg/dL (ref 0.3–1.2)
BUN: 36 mg/dL — AB (ref 6–20)
CO2: 28 mmol/L (ref 22–32)
Calcium: 8.4 mg/dL — ABNORMAL LOW (ref 8.9–10.3)
Chloride: 99 mmol/L — ABNORMAL LOW (ref 101–111)
Creatinine, Ser: 1.05 mg/dL — ABNORMAL HIGH (ref 0.44–1.00)
GFR calc Af Amer: >60 mL/min (ref >60)
GFR calc non Af Amer: 53 mL/min — ABNORMAL LOW (ref >60)
GLUCOSE: 126 mg/dL — AB (ref 65–99)
POTASSIUM: 4.7 mmol/L (ref 3.5–5.1)
SODIUM: 135 mmol/L (ref 135–145)
TOTAL PROTEIN: 5.7 g/dL — AB (ref 6.5–8.1)

## 2016-06-03 LAB — URINALYSIS, ROUTINE W REFLEX MICROSCOPIC
Bilirubin Urine: NEGATIVE
GLUCOSE, UA: 50 mg/dL — AB
HGB URINE DIPSTICK: NEGATIVE
Ketones, ur: NEGATIVE mg/dL
LEUKOCYTES UA: NEGATIVE
Nitrite: NEGATIVE
PH: 5 (ref 5.0–8.0)
PROTEIN: NEGATIVE mg/dL
SPECIFIC GRAVITY, URINE: 1.025 (ref 1.005–1.030)

## 2016-06-03 LAB — CBC WITH DIFFERENTIAL/PLATELET
Basophils Absolute: 0.1 10*3/uL (ref 0.0–0.1)
Basophils Relative: 1 %
EOS ABS: 0 10*3/uL (ref 0.0–0.7)
Eosinophils Relative: 0 %
HEMATOCRIT: 33.4 % — AB (ref 36.0–46.0)
HEMOGLOBIN: 10.5 g/dL — AB (ref 12.0–15.0)
LYMPHS ABS: 0.7 10*3/uL (ref 0.7–4.0)
LYMPHS PCT: 6 %
MCH: 31.2 pg (ref 26.0–34.0)
MCHC: 31.4 g/dL (ref 30.0–36.0)
MCV: 99.1 fL (ref 78.0–100.0)
Monocytes Absolute: 1.4 10*3/uL — ABNORMAL HIGH (ref 0.1–1.0)
Monocytes Relative: 11 %
NEUTROS PCT: 82 %
Neutro Abs: 10.2 10*3/uL — ABNORMAL HIGH (ref 1.7–7.7)
Platelets: 350 10*3/uL (ref 150–400)
RBC: 3.37 MIL/uL — AB (ref 3.87–5.11)
RDW: 17.6 % — ABNORMAL HIGH (ref 11.5–15.5)
WBC: 12.4 10*3/uL — AB (ref 4.0–10.5)

## 2016-06-03 LAB — BRAIN NATRIURETIC PEPTIDE: B Natriuretic Peptide: 907 pg/mL — ABNORMAL HIGH (ref 0.0–100.0)

## 2016-06-03 LAB — LACTIC ACID, PLASMA: Lactic Acid, Venous: 1.8 mmol/L (ref 0.5–1.9)

## 2016-06-03 LAB — TROPONIN I: Troponin I: <0.03 ng/mL (ref <0.03)

## 2016-06-03 MED ORDER — PROMETHAZINE HCL 12.5 MG PO TABS
12.5000 mg | ORAL_TABLET | Freq: Once | ORAL | Status: AC
  Administered 2016-06-03: 12.5 mg via ORAL
  Filled 2016-06-03: qty 1

## 2016-06-03 MED ORDER — METOPROLOL TARTRATE 50 MG PO TABS
50.0000 mg | ORAL_TABLET | Freq: Two times a day (BID) | ORAL | Status: DC
  Administered 2016-06-03 – 2016-06-06 (×6): 50 mg via ORAL
  Filled 2016-06-03 (×7): qty 1

## 2016-06-03 MED ORDER — DILTIAZEM HCL ER COATED BEADS 240 MG PO CP24
240.0000 mg | ORAL_CAPSULE | Freq: Every day | ORAL | Status: DC
  Administered 2016-06-04 – 2016-06-06 (×3): 240 mg via ORAL
  Filled 2016-06-03 (×3): qty 1

## 2016-06-03 MED ORDER — ALBUTEROL SULFATE (2.5 MG/3ML) 0.083% IN NEBU
2.5000 mg | INHALATION_SOLUTION | RESPIRATORY_TRACT | Status: DC | PRN
  Administered 2016-06-04 – 2016-06-06 (×3): 2.5 mg via RESPIRATORY_TRACT
  Filled 2016-06-03 (×3): qty 3

## 2016-06-03 MED ORDER — DEXTROSE 5 % IV SOLN
1.0000 g | Freq: Two times a day (BID) | INTRAVENOUS | Status: DC
  Administered 2016-06-03 – 2016-06-06 (×6): 1 g via INTRAVENOUS
  Filled 2016-06-03 (×12): qty 1

## 2016-06-03 MED ORDER — HYDROCODONE-ACETAMINOPHEN 5-325 MG PO TABS
2.0000 | ORAL_TABLET | Freq: Once | ORAL | Status: AC
  Administered 2016-06-03: 2 via ORAL
  Filled 2016-06-03: qty 2

## 2016-06-03 MED ORDER — METHYLPREDNISOLONE SODIUM SUCC 125 MG IJ SOLR
125.0000 mg | Freq: Once | INTRAMUSCULAR | Status: AC
  Administered 2016-06-03: 125 mg via INTRAVENOUS
  Filled 2016-06-03: qty 2

## 2016-06-03 MED ORDER — MENTHOL 3 MG MT LOZG
1.0000 | LOZENGE | OROMUCOSAL | Status: DC | PRN
  Administered 2016-06-03 – 2016-06-05 (×2): 3 mg via ORAL
  Filled 2016-06-03 (×2): qty 9

## 2016-06-03 MED ORDER — GUAIFENESIN ER 600 MG PO TB12
1200.0000 mg | ORAL_TABLET | Freq: Two times a day (BID) | ORAL | Status: DC
  Administered 2016-06-03 – 2016-06-06 (×7): 1200 mg via ORAL
  Filled 2016-06-03 (×7): qty 2

## 2016-06-03 MED ORDER — HYDROCODONE-ACETAMINOPHEN 10-325 MG PO TABS
1.0000 | ORAL_TABLET | Freq: Four times a day (QID) | ORAL | Status: DC | PRN
  Administered 2016-06-03 – 2016-06-06 (×12): 1 via ORAL
  Filled 2016-06-03 (×12): qty 1

## 2016-06-03 MED ORDER — ALBUTEROL SULFATE (2.5 MG/3ML) 0.083% IN NEBU
5.0000 mg | INHALATION_SOLUTION | Freq: Once | RESPIRATORY_TRACT | Status: AC
  Administered 2016-06-03: 5 mg via RESPIRATORY_TRACT
  Filled 2016-06-03: qty 6

## 2016-06-03 MED ORDER — CEFEPIME HCL 1 G IJ SOLR
1.0000 g | Freq: Once | INTRAMUSCULAR | Status: AC
  Administered 2016-06-03: 1 g via INTRAVENOUS
  Filled 2016-06-03: qty 1

## 2016-06-03 MED ORDER — ENSURE ENLIVE PO LIQD
237.0000 mL | Freq: Two times a day (BID) | ORAL | Status: DC
  Administered 2016-06-04: 237 mL via ORAL

## 2016-06-03 MED ORDER — MOMETASONE FURO-FORMOTEROL FUM 200-5 MCG/ACT IN AERO
2.0000 | INHALATION_SPRAY | Freq: Two times a day (BID) | RESPIRATORY_TRACT | Status: DC
  Administered 2016-06-03 – 2016-06-06 (×6): 2 via RESPIRATORY_TRACT
  Filled 2016-06-03: qty 8.8

## 2016-06-03 MED ORDER — PANTOPRAZOLE SODIUM 40 MG PO TBEC
40.0000 mg | DELAYED_RELEASE_TABLET | Freq: Every day | ORAL | Status: DC
  Administered 2016-06-04 – 2016-06-06 (×3): 40 mg via ORAL
  Filled 2016-06-03 (×3): qty 1

## 2016-06-03 MED ORDER — METHYLPREDNISOLONE SODIUM SUCC 125 MG IJ SOLR
60.0000 mg | Freq: Three times a day (TID) | INTRAMUSCULAR | Status: DC
  Administered 2016-06-03 – 2016-06-06 (×10): 60 mg via INTRAVENOUS
  Filled 2016-06-03 (×10): qty 2

## 2016-06-03 MED ORDER — VANCOMYCIN HCL 500 MG IV SOLR
500.0000 mg | Freq: Two times a day (BID) | INTRAVENOUS | Status: DC
  Administered 2016-06-03 – 2016-06-06 (×6): 500 mg via INTRAVENOUS
  Filled 2016-06-03 (×12): qty 500

## 2016-06-03 MED ORDER — RIVAROXABAN 15 MG PO TABS
15.0000 mg | ORAL_TABLET | Freq: Every day | ORAL | Status: DC
  Administered 2016-06-04 – 2016-06-05 (×2): 15 mg via ORAL
  Filled 2016-06-03 (×2): qty 1

## 2016-06-03 MED ORDER — VANCOMYCIN HCL IN DEXTROSE 1-5 GM/200ML-% IV SOLN
1000.0000 mg | Freq: Once | INTRAVENOUS | Status: AC
  Administered 2016-06-03: 1000 mg via INTRAVENOUS
  Filled 2016-06-03: qty 200

## 2016-06-03 NOTE — ED Triage Notes (Signed)
Pt was brought in by EMS for SOB that started this morning. Pt states unsure what time it started. Pt discharged yesterday from hospital. Sats 90 on room air when EMS arrived, then decreased to 82. Currently 95 % on 2.5 L via [place]

## 2016-06-03 NOTE — ED Notes (Signed)
Took BCS to room, Pt unable to void at this time

## 2016-06-03 NOTE — Progress Notes (Signed)
Pharmacy Antibiotic Note  Lindsay Salazar is a 66 y.o. female admitted on 06/03/2016 with pneumonia and cellulitis.  Pharmacy has been consulted for Vancomycin and Cefepime dosing.  Plan:  Vancomycin 1000mg  x 1 then 500mg  IV q12h Check trough at steady state Cefepime 1gm IV q12hrs Monitor labs, renal fxn, progress and c/s Deescalate ABX when improved / appropriate.    Weight: 120 lb (54.4 kg)  Temp (24hrs), Avg:98.3 F (36.8 C), Min:97.9 F (36.6 C), Max:98.8 F (37.1 C)   Recent Labs Lab 05/30/16 0700 05/31/16 0717 06/01/16 0702 06/02/16 0418 06/03/16 0901  WBC 3.9* 8.7 10.4 9.1 12.4*  CREATININE 1.28* 1.22* 1.34* 1.32* 1.05*  LATICACIDVEN  --   --   --   --  1.8    Estimated Creatinine Clearance: 44 mL/min (A) (by C-G formula based on SCr of 1.05 mg/dL (H)).    Allergies  Allergen Reactions  . Ibuprofen Nausea And Vomiting  . Other     Seafood   . Penicillins Swelling    Has patient had a PCN reaction causing immediate rash, facial/tongue/throat swelling, SOB or lightheadedness with hypotension: Yes Has patient had a PCN reaction causing severe rash involving mucus membranes or skin necrosis: No Has patient had a PCN reaction that required hospitalization No Has patient had a PCN reaction occurring within the last 10 years: No If all of the above answers are "NO", then may proceed with Cephalosporin use.   . Tiotropium Bromide Monohydrate Itching  . Tramadol Nausea And Vomiting   Antimicrobials this admission: Vanc 5/1 >>  Cefepime 5/1 >>   Dose adjustments this admission:  Microbiology results:  BCx: pending  UCx: pending   Sputum:    MRSA PCR:   Thank you for allowing pharmacy to be a part of this patient's care.  Lindsay Salazar A 06/03/2016 1:00 PM
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
## 2016-06-03 NOTE — ED Provider Notes (Signed)
[facility] DEPT Provider Note   CSN: [id number] Arrival date & time: 06/03/16  0815     History   Chief Complaint Chief Complaint  Patient presents with  . Shortness of Breath    HPI Lindsay Salazar is a 66 y.o. female.  Patient is a 66 year old female who presents to the emergency department with complaint of increasing shortness of breath.  This dural has a history of chronic obstructive pulmonary disease and emphysema requiring home oxygen and nebulizer treatments. She has chronic respiratory failure with hypoxia. She has anemia, history of pancreatic cancer, and cellulitis of her left lower extremity. She was discharged from the hospital on 06/02/2016. She states that early this morning she noted that she was more short of breath than when she had gotten home. She acknowledges that she had been fussing with her roommate, but even after she calmed down from that she was short of breath with cough and wheezing. She noted increased problem with going to the bathroom but without having to rest in between the bedroom in the bathroom, she also noted that after using the bathroom she had to rest for moment to get up and return to her bedroom area. She denies chest pain. She denies loss of consciousness. She has not measured a temperature, she states that it seems as though is hard for her to get warm, but she is unsure of fever or chills. She states that the swelling and redness of her lower leg is improved, but her cough and breathing did not seem to be any better, and is probably worse. She presents now for assistance with this issue.      Past Medical History:  Diagnosis Date  . Anemia of chronic disease   . Arthritis    bursitis , fibromyalgia  . Arthritis    "right hip" (11/27/2015)  . Asthma   . Chronic bronchitis ([place])   . Chronic respiratory failure ([place])   . Chronic right hip pain   . COPD (chronic obstructive pulmonary disease) ([place])    wears home o2 prn  . Daily headache     "last 2-3 months" (11/27/2015)  . DVT (deep venous thrombosis) ([place]) 03/2016  . Exposure to TB    "mom had it when she was pregnant with me"  . Fibromyalgia   . GERD (gastroesophageal reflux disease)    use to have it but not now  . Heart murmur    [age] years old  . Hyperkalemia   . Hypertension   . On home oxygen therapy    11/27/2015 "whenever I need it; don't know how many liters"  . PAF (paroxysmal atrial fibrillation) ([place])   . Pancreatic cancer ([place])   . Paroxysmal atrial flutter ([place])   . Pneumonia    "several times" (11/27/2015)  . Pneumothorax     Patient Active Problem List   Diagnosis Date Noted  . Cellulitis of left foot 05/26/2016  . COPD exacerbation ([place]) 05/09/2016  . Cellulitis 04/26/2016  . COPD (chronic obstructive pulmonary disease) ([place]) 04/26/2016  . Chronic respiratory failure with hypoxia ([place]) 04/26/2016  . Hypoxemia 03/30/2016  . Acute on chronic respiratory failure with hypoxia ([place]) 03/29/2016  . Leukocytosis 03/29/2016  . Anemia of chronic disease 03/29/2016  . Essential hypertension 03/29/2016  . HCAP (healthcare-associated pneumonia) 03/28/2016  . B12 deficiency 01/24/2016  . COPD with acute exacerbation ([place]) 01/20/2016  . Pancreatic cancer ([place]) 12/07/2015  . Chronic anticoagulation-Xarelto 09/06/2015  . Gastrointestinal hemorrhage with melena 08/08/2015  .  Atrial fibrillation ([place]) 07/17/2015    Past Surgical History:  Procedure Laterality Date  . ANTERIOR CERVICAL DECOMP/DISCECTOMY FUSION     "put 4 screws in"  . BACK SURGERY    . CARDIAC CATHETERIZATION  08/2004   Archie Endo 06/18/2010  . CARDIOVERSION N/A 07/18/2015   Procedure: CARDIOVERSION;  Surgeon: Josue Hector, MD;  Location: [facility];  Service: Cardiovascular;  Laterality: N/A;  . ESOPHAGOGASTRODUODENOSCOPY N/A 08/09/2015   Procedure: ESOPHAGOGASTRODUODENOSCOPY (EGD);  Surgeon: Rogene Houston, MD;  Location: [facility] ENDO SUITE;  Service: Endoscopy;  Laterality: N/A;  .  EUS N/A 11/29/2015   Procedure: UPPER ENDOSCOPIC ULTRASOUND (EUS) RADIAL;  Surgeon: Milus Banister, MD;  Location: [facility] ENDOSCOPY;  Service: Endoscopy;  Laterality: N/A;  . IR GENERIC HISTORICAL  12/18/2015   IR US GUIDE VASC ACCESS RIGHT 12/18/2015 Sandi Mariscal, MD [facility]  . IR GENERIC HISTORICAL  12/18/2015   IR FLUORO GUIDE PORT INSERTION RIGHT 12/18/2015 Sandi Mariscal, MD [facility]  . TONSILLECTOMY    . TUBAL LIGATION      OB History    Gravida Para Term Preterm AB Living   2         2   SAB TAB Ectopic Multiple Live Births                   Home Medications    Prior to Admission medications   Medication Sig Start Date End Date Taking? Authorizing Provider  clindamycin (CLEOCIN) 300 MG capsule Take 1 capsule (300 mg total) by mouth every 8 (eight) hours. 06/02/16 06/05/16  [place], DO  COMBIVENT RESPIMAT 20-100 MCG/ACT AERS respimat Inhale 1 puff into the lungs 2 (two) times daily. 12/03/15   Ripudeep Krystal Eaton, MD  diltiazem (CARDIZEM CD) 240 MG 24 hr capsule Take 1 capsule (240 mg total) by mouth daily. 05/23/16 08/21/16  Lendon Colonel, NP  guaiFENesin (MUCINEX) 600 MG 12 hr tablet Take 2 tablets (1,200 mg total) by mouth 2 (two) times daily. 06/02/16   [place], DO  HYDROcodone-acetaminophen (NORCO) 10-325 MG tablet Take 1 tablet by mouth every 6 (six) hours as needed. 04/16/16   Baird Cancer, PA-C  ipratropium-albuterol (DUONEB) 0.5-2.5 (3) MG/3ML SOLN Inhale 3 mLs into the lungs every 6 (six) hours as needed (for shortness of breath).  03/03/16   Historical Provider, MD  menthol-cetylpyridinium (CEPACOL) 3 MG lozenge Take 1 lozenge (3 mg total) by mouth as needed for sore throat. 06/02/16   Kerney Elbe, DO  metoprolol tartrate (LOPRESSOR) 50 MG tablet Take 1 tablet (50 mg total) by mouth 2 (two) times daily. 03/02/16   Kathie Dike, MD  ondansetron (ZOFRAN) 8 MG tablet Take 1 tablet by mouth daily as needed for nausea/vomiting. 12/10/15    Historical Provider, MD  OXYGEN Inhale 2 L into the lungs daily as needed (for breathing).    Historical Provider, MD  PACLitaxel Protein-Bound Part (ABRAXANE IV) Inject into the vein. Day 1, day 8, day 15, every 28 days    Historical Provider, MD  pantoprazole (PROTONIX) 40 MG tablet Take 1 tablet (40 mg total) by mouth daily. 12/03/15   Ripudeep Krystal Eaton, MD  predniSONE (STERAPRED UNI-PAK 21 TAB) 10 MG (21) TBPK tablet Take 6 Tablets Day 1; 5 Tablets Day 2, 4 Tablets Day 3, 3 Tablets Day 4, 2 Tablets Day 5, 1 Tablet Day 6, and Stop on Day 7 06/02/16   Kerney Elbe, DO  prochlorperazine (COMPAZINE) 10 MG  tablet Take 1 tablet by mouth daily as needed for nausea/vomiting. 12/10/15   Historical Provider, MD  Rivaroxaban (XARELTO) 15 MG TABS tablet Take 1 tablet (15 mg total) by mouth daily with supper. 05/01/16   Orson Eva, MD  SYMBICORT 160-4.5 MCG/ACT inhaler Inhale 1 puff into the lungs 2 (two) times daily. 12/03/15   Ripudeep Krystal Eaton, MD    Family History Family History  Problem Relation Age of Onset  . COPD Mother   . Diabetes Father   . Stroke Father     Social History Social History  Substance Use Topics  . Smoking status: Former Smoker    Packs/day: 0.50    Years: 46.00    Types: Cigarettes    Start date: 10/09/1969    Quit date: 11/29/2015  . Smokeless tobacco: Never Used  . Alcohol use No     Comment: quit 30 years ago     Allergies   Ibuprofen; Other; Penicillins; Tiotropium bromide monohydrate; and Tramadol   Review of Systems Review of Systems  Constitutional: Positive for activity change, chills and fatigue.  Respiratory: Positive for cough, shortness of breath and wheezing.   Musculoskeletal:       Lower extremity swelling  All other systems reviewed and are negative.    Physical Exam Updated Vital Signs BP 113/83   Pulse 77   Temp 97.9 F (36.6 C) (Oral)   Resp 20   Wt 54.4 kg   SpO2 99%   BMI 20.60 kg/m   Physical Exam  Constitutional: She is  oriented to person, place, and time. She appears well-developed and well-nourished.  Non-toxic appearance.  HENT:  Head: Normocephalic.  Right Ear: Tympanic membrane and external ear normal.  Left Ear: Tympanic membrane and external ear normal.  Eyes: EOM and lids are normal. Pupils are equal, round, and reactive to light.  Neck: Normal range of motion. Neck supple. Carotid bruit is not present.  Cardiovascular: Intact distal pulses and normal pulses.  An irregularly irregular rhythm present. Tachycardia present.   Pulmonary/Chest: Accessory muscle usage present. Tachypnea noted. No respiratory distress. She has wheezes. She has rhonchi.  Patient unable to speak in complete sentences initially.  Abdominal: Soft. Bowel sounds are normal. There is no tenderness. There is no guarding.  Musculoskeletal:  There is 1-2+ pitting edema of the lower extremities. There are bandages over the wounds of the  left lower extremity. No red streaks appreciated. The lower extremities are not hot to touch.  Lymphadenopathy:       Head (right side): No submandibular adenopathy present.       Head (left side): No submandibular adenopathy present.    She has no cervical adenopathy.  Neurological: She is alert and oriented to person, place, and time. She has normal strength. No cranial nerve deficit or sensory deficit.  Skin: Skin is warm and dry.  Psychiatric: She has a normal mood and affect. Her speech is normal.  Nursing note and vitals reviewed.    ED Treatments / Results  Labs (all labs ordered are listed, but only abnormal results are displayed) Labs Reviewed  COMPREHENSIVE METABOLIC PANEL - Abnormal; Notable for the following:       Result Value   Chloride 99 (*)    Glucose, Bld 126 (*)    BUN 36 (*)    Creatinine, Ser 1.05 (*)    Calcium 8.4 (*)    Total Protein 5.7 (*)    Albumin 3.1 (*)    GFR calc non  Af Amer 53 (*)    All other components within normal limits  BRAIN NATRIURETIC PEPTIDE -  Abnormal; Notable for the following:    B Natriuretic Peptide 907.0 (*)    All other components within normal limits  CBC WITH DIFFERENTIAL/PLATELET - Abnormal; Notable for the following:    WBC 12.4 (*)    RBC 3.37 (*)    Hemoglobin 10.5 (*)    HCT 33.4 (*)    RDW 17.6 (*)    Neutro Abs 10.2 (*)    Monocytes Absolute 1.4 (*)    All other components within normal limits  TROPONIN I  URINALYSIS, ROUTINE W REFLEX MICROSCOPIC  LACTIC ACID, PLASMA    EKG  EKG Interpretation None       Radiology Dg Chest Portable 1 View  Result Date: 06/03/2016 CLINICAL DATA:  Shortness of breath. EXAM: PORTABLE CHEST 1 VIEW COMPARISON:  Radiographs of May 26, 2016. FINDINGS: Stable cardiomediastinal silhouette. Atherosclerosis of thoracic aorta is noted. Right internal jugular catheter is noted with distal tip in expected position of SVC. No pneumothorax is noted. Mild central pulmonary vascular congestion is noted. Increased left basilar opacity is noted concerning for worsening atelectasis or infiltrate with associated pleural effusion. Bony thorax is unremarkable. IMPRESSION: Increased left basilar opacity is noted concerning for worsening atelectasis or infiltrate with associated pleural effusion. Aortic atherosclerosis. Mild central pulmonary vascular congestion. Electronically Signed   By: Marijo Conception, M.D.   On: 06/03/2016 09:01    Procedures Procedures (including critical care time)  Medications Ordered in ED Medications  ceFEPIme (MAXIPIME) 1 g in dextrose 5 % 50 mL IVPB (not administered)  albuterol (PROVENTIL) (2.5 MG/3ML) 0.083% nebulizer solution 5 mg (5 mg Nebulization Given 06/03/16 0856)  HYDROcodone-acetaminophen (NORCO/VICODIN) 5-325 MG per tablet 2 tablet (2 tablets Oral Given 06/03/16 0937)  promethazine (PHENERGAN) tablet 12.5 mg (12.5 mg Oral Given 06/03/16 0937)  methylPREDNISolone sodium succinate (SOLU-MEDROL) 125 mg/2 mL injection 125 mg (125 mg Intravenous Given 06/03/16  0938)     Initial Impression / Assessment and Plan / ED Course  I have reviewed the triage vital signs and the nursing notes.  Pertinent labs & imaging results that were available during my care of the patient were reviewed by me and considered in my medical decision making (see chart for details).       Final Clinical Impressions(s) / ED Diagnoses MDM Patient is tachycardic and tachypneic on admission. Pulse oximetry is 95% on oxygen.  Patient unable to complete sentences initially.  Urinalysis is negative for acute problem. B natruretic peptide is elevated at 907. Competence of metabolic panel shows the BUN to be elevated at 36, the creatinine elevated at 1.05, the anion gap is within normal limits 8. The troponin is negative for acute event. Complete blood count shows an elevation in white blood cells of 12,400. Lactic acid is within normal range at 1.8. Chest x-ray questions increase left basilar opacity with possible infiltrate with pleural effusion versus atelectasis. There is some mild central pulmonary vascular congestion present. The electrocardiogram shows a atrial fibrillation present.  Case discussed with hospitalist. Patient will be admitted to the hospital for evaluation of dyspnea and possible pneumonia.  Patient to be admitted.    Final diagnoses:  Dyspnea, unspecified type    New Prescriptions New Prescriptions   No medications on file     Lily Kocher, PA-C 06/05/16 [place], MD 06/06/16 1404

## 2016-06-04 ENCOUNTER — Other Ambulatory Visit (HOSPITAL_COMMUNITY): Payer: Self-pay | Admitting: Oncology

## 2016-06-04 DIAGNOSIS — C254 Malignant neoplasm of endocrine pancreas: Secondary | ICD-10-CM

## 2016-06-04 DIAGNOSIS — E440 Moderate protein-calorie malnutrition: Secondary | ICD-10-CM | POA: Insufficient documentation

## 2016-06-04 DIAGNOSIS — I10 Essential (primary) hypertension: Secondary | ICD-10-CM

## 2016-06-04 LAB — CBC
HCT: 29.6 % — ABNORMAL LOW (ref 36.0–46.0)
Hemoglobin: 9.3 g/dL — ABNORMAL LOW (ref 12.0–15.0)
MCH: 30.9 pg (ref 26.0–34.0)
MCHC: 31.4 g/dL (ref 30.0–36.0)
MCV: 98.3 fL (ref 78.0–100.0)
PLATELETS: 318 10*3/uL (ref 150–400)
RBC: 3.01 MIL/uL — ABNORMAL LOW (ref 3.87–5.11)
RDW: 17.6 % — ABNORMAL HIGH (ref 11.5–15.5)
WBC: 7.3 10*3/uL (ref 4.0–10.5)

## 2016-06-04 LAB — BASIC METABOLIC PANEL
Anion gap: 9 (ref 5–15)
BUN: 38 mg/dL — ABNORMAL HIGH (ref 6–20)
CALCIUM: 8.3 mg/dL — AB (ref 8.9–10.3)
CO2: 27 mmol/L (ref 22–32)
CREATININE: 1.06 mg/dL — AB (ref 0.44–1.00)
Chloride: 98 mmol/L — ABNORMAL LOW (ref 101–111)
GFR calc Af Amer: >60 mL/min (ref >60)
GFR calc non Af Amer: 53 mL/min — ABNORMAL LOW (ref >60)
GLUCOSE: 225 mg/dL — AB (ref 65–99)
Potassium: 4.7 mmol/L (ref 3.5–5.1)
Sodium: 134 mmol/L — ABNORMAL LOW (ref 135–145)

## 2016-06-04 LAB — STREP PNEUMONIAE URINARY ANTIGEN: Strep Pneumo Urinary Antigen: NEGATIVE

## 2016-06-04 LAB — HIV ANTIBODY (ROUTINE TESTING W REFLEX): HIV Screen 4th Generation wRfx: NONREACTIVE

## 2016-06-04 MED ORDER — GLUCERNA SHAKE PO LIQD
237.0000 mL | Freq: Two times a day (BID) | ORAL | Status: DC
  Administered 2016-06-06: 237 mL via ORAL

## 2016-06-04 MED ORDER — ALBUTEROL SULFATE (2.5 MG/3ML) 0.083% IN NEBU
2.5000 mg | INHALATION_SOLUTION | RESPIRATORY_TRACT | Status: DC
  Administered 2016-06-04 – 2016-06-06 (×9): 2.5 mg via RESPIRATORY_TRACT
  Filled 2016-06-04 (×9): qty 3

## 2016-06-04 MED ORDER — FUROSEMIDE 10 MG/ML IJ SOLN
40.0000 mg | Freq: Once | INTRAMUSCULAR | Status: AC
  Administered 2016-06-04: 40 mg via INTRAVENOUS
  Filled 2016-06-04: qty 4

## 2016-06-04 MED ORDER — VANCOMYCIN HCL 500 MG IV SOLR
INTRAVENOUS | Status: AC
  Filled 2016-06-04: qty 500

## 2016-06-04 NOTE — Progress Notes (Signed)
PROGRESS NOTE    Lindsay Salazar  PNT:[id number] DOB: 1950-10-09 DOA: 06/03/2016 PCP: [name], PA-C    Brief Narrative:  66 year old female with nausea dependent COPD, atrial fibrillation and pancreatic cancer, who has had repeated admissions for COPD exacerbations. The patient was discharged on 4/30 after being treated for cellulitis. She came back to the hospital the following day for shortness of breath admitted for COPD exacerbation with possible healthcare associated pneumonia.   Assessment & Plan:   Active Problems:   Atrial fibrillation (HCC)   Chronic anticoagulation-Xarelto   Pancreatic cancer (HCC)   Essential hypertension   Chronic respiratory failure with hypoxia (HCC)   COPD exacerbation (HCC)   Pneumonia   Malnutrition of moderate degree   1. Chronic respiratory failure with hypoxia. She is on her baseline oxygen requirement of 3 L.  2. COPD exacerbation. Continue on bronchodilators, steroids and antibiotics.  3. Healthcare associated pneumonia. Currently on vancomycin and cefepime. Continue current treatments.  4. Chronic atrial fibrillation. Rate controlled on diltiazem and metoprolol. Anticoagulated with xarelto. Heart rate currently stable.  5. Pancreatic cancer. Follow-up with oncology for further management.   DVT prophylaxis: xarelto Code Status: full code Family Communication: no family present Disposition Plan: discharge home once improved   Consultants:     Procedures:     Antimicrobials:   Vancomycin 5/1>>  Cefepime 5/1>>   Subjective: Still short of breath, coughing  Objective: Vitals:   06/04/16 0400 06/04/16 0744 06/04/16 1400 06/04/16 1454  BP: 129/78  (!) 168/83   Pulse: 84  89   Resp: (!) 21  (!) 22   Temp: 98.2 F (36.8 C)  97.8 F (36.6 C)   TempSrc: Oral  Oral   SpO2: 99% 94% 97% 92%  Weight:      Height:        Intake/Output Summary (Last 24 hours) at 06/04/16 1743 Last data filed at 06/04/16 1200  Gross  per 24 hour  Intake             1690 ml  Output              400 ml  Net             1290 ml   Filed Weights   06/03/16 0818  Weight: 54.4 kg (120 lb)    Examination:  General exam: Appears calm and comfortable  Respiratory system: bilateral wheezes and rhonchi. Respiratory effort normal. Cardiovascular system: S1 & S2 heard, RRR. No JVD, murmurs, rubs, gallops or clicks. 1+ pedal edema. Gastrointestinal system: Abdomen is nondistended, soft and nontender. No organomegaly or masses felt. Normal bowel sounds heard. Central nervous system: Alert and oriented. No focal neurological deficits. Extremities: erythema of left great toe Skin: No rashes, lesions or ulcers Psychiatry: Judgement and insight appear normal. Mood & affect appropriate.     Data Reviewed: I have personally reviewed following labs and imaging studies  CBC:  Recent Labs Lab 05/30/16 0700 05/31/16 0717 06/01/16 0702 06/02/16 0418 06/03/16 0901 06/04/16 0609  WBC 3.9* 8.7 10.4 9.1 12.4* 7.3  NEUTROABS 3.4 7.4 9.2* 7.1 10.2*  --   HGB 9.6* 9.4* 10.0* 10.0* 10.5* 9.3*  HCT 30.1* 29.6* 31.8* 31.8* 33.4* 29.6*  MCV 96.2 97.4 98.1 98.5 99.1 98.3  PLT 310 387 430* 373 350 086   Basic Metabolic Panel:  Recent Labs Lab 05/29/16 0513 05/30/16 0700 05/31/16 0717 06/01/16 0702 06/02/16 0418 06/03/16 0901 06/04/16 0609  NA 135 131* 133* 135 136 135  134*  K 4.3 4.7 4.4 4.3 4.8 4.7 4.7  CL 104 98* 102 100* 101 99* 98*  CO2 22 23 24 26 27 28 27   GLUCOSE 117* 198* 187* 181* 130* 126* 225*  BUN 26* 25* 27* 36* 41* 36* 38*  CREATININE 1.24* 1.28* 1.22* 1.34* 1.32* 1.05* 1.06*  CALCIUM 7.4* 8.1* 8.1* 8.1* 8.1* 8.4* 8.3*  MG 1.2* 2.3 1.8 1.8 1.8  --   --   PHOS 3.7 3.5 3.0 4.0 4.3  --   --    GFR: Estimated Creatinine Clearance: 43.6 mL/min (A) (by C-G formula based on SCr of 1.06 mg/dL (H)). Liver Function Tests:  Recent Labs Lab 05/30/16 0700 05/31/16 0717 06/01/16 0702 06/02/16 0418 06/03/16 0901    AST 18 19 19 19 24   ALT 23 21 22 22 23   ALKPHOS 111 103 102 82 86  BILITOT 0.4 0.3 0.4 0.4 0.7  PROT 5.7* 5.2* 5.5* 5.4* 5.7*  ALBUMIN 2.9* 2.7* 2.9* 2.9* 3.1*   No results for input(s): LIPASE, AMYLASE in the last 168 hours. No results for input(s): AMMONIA in the last 168 hours. Coagulation Profile: No results for input(s): INR, PROTIME in the last 168 hours. Cardiac Enzymes:  Recent Labs Lab 06/03/16 0901  TROPONINI <0.03   BNP (last 3 results) No results for input(s): PROBNP in the last 8760 hours. HbA1C: No results for input(s): HGBA1C in the last 72 hours. CBG: No results for input(s): GLUCAP in the last 168 hours. Lipid Profile: No results for input(s): CHOL, HDL, LDLCALC, TRIG, CHOLHDL, LDLDIRECT in the last 72 hours. Thyroid Function Tests: No results for input(s): TSH, T4TOTAL, FREET4, T3FREE, THYROIDAB in the last 72 hours. Anemia Panel: No results for input(s): VITAMINB12, FOLATE, FERRITIN, TIBC, IRON, RETICCTPCT in the last 72 hours. Sepsis Labs:  Recent Labs Lab 06/03/16 0901  LATICACIDVEN 1.8    Recent Results (from the past 240 hour(s))  Culture, blood (routine x 2) Call MD if unable to obtain prior to antibiotics being given     Status: None (Preliminary result)   Collection Time: 06/03/16  1:18 PM  Result Value Ref Range Status   Specimen Description BLOOD RIGHT HAND  Final   Special Requests   Final    BOTTLES DRAWN AEROBIC AND ANAEROBIC Blood Culture adequate volume   Culture NO GROWTH < 24 HOURS  Final   Report Status PENDING  Incomplete  Culture, blood (routine x 2) Call MD if unable to obtain prior to antibiotics being given     Status: None (Preliminary result)   Collection Time: 06/03/16  1:18 PM  Result Value Ref Range Status   Specimen Description BLOOD RIGHT HAND  Final   Special Requests   Final    BOTTLES DRAWN AEROBIC ONLY Blood Culture results may not be optimal due to an inadequate volume of blood received in culture bottles    Culture NO GROWTH < 24 HOURS  Final   Report Status PENDING  Incomplete         Radiology Studies: Dg Chest Portable 1 View  Result Date: 06/03/2016 CLINICAL DATA:  Shortness of breath. EXAM: PORTABLE CHEST 1 VIEW COMPARISON:  Radiographs of May 26, 2016. FINDINGS: Stable cardiomediastinal silhouette. Atherosclerosis of thoracic aorta is noted. Right internal jugular catheter is noted with distal tip in expected position of SVC. No pneumothorax is noted. Mild central pulmonary vascular congestion is noted. Increased left basilar opacity is noted concerning for worsening atelectasis or infiltrate with associated pleural effusion. Bony thorax is  unremarkable. IMPRESSION: Increased left basilar opacity is noted concerning for worsening atelectasis or infiltrate with associated pleural effusion. Aortic atherosclerosis. Mild central pulmonary vascular congestion. Electronically Signed   By: Marijo Conception, M.D.   On: 06/03/2016 09:01        Scheduled Meds: . diltiazem  240 mg Oral Daily  . feeding supplement (GLUCERNA SHAKE)  237 mL Oral BID BM  . furosemide  40 mg Intravenous Once  . guaiFENesin  1,200 mg Oral BID  . methylPREDNISolone (SOLU-MEDROL) injection  60 mg Intravenous Q8H  . metoprolol  50 mg Oral BID  . mometasone-formoterol  2 puff Inhalation BID  . pantoprazole  40 mg Oral Daily  . Rivaroxaban  15 mg Oral Q supper   Continuous Infusions: . ceFEPime (MAXIPIME) IV Stopped (06/04/16 0958)  . vancomycin Stopped (06/04/16 1105)     LOS: 1 day    Time spent: 81mins    Fidel Caggiano, MD [facility] Pager [phone]  If 7PM-7AM, please contact night-coverage [email or web address] Password [facility] 06/04/2016, 5:43 PM

## 2016-06-04 NOTE — Progress Notes (Signed)
Inpatient Diabetes Program Recommendations  AACE/ADA: New Consensus Statement on Inpatient Glycemic Control (2015)  Target Ranges:  Prepandial:   less than 140 mg/dL      Peak postprandial:   less than 180 mg/dL (1-2 hours)      Critically ill patients:  140 - 180 mg/dL   Results for ARION, MORGAN (MRN [id number]) as of 06/04/2016 08:31  Ref. Range 06/03/2016 09:01 06/04/2016 06:09  Glucose Latest Ref Range: 65 - 99 mg/dL 126 (H) 225 (H)   Review of Glycemic Control  Diabetes history: No Outpatient Diabetes medications: NA Current orders for Inpatient glycemic control: None  Inpatient Diabetes Program Recommendations: Correction (SSI): While inpatient and ordered steroids, please consider ordering CBGs with Novolog correction scale ACHS.  NOTE: Patient is ordered Solumedrol 60 mg Q8H which is likely cause of hyperglycemia.   Thanks, [name], RN, MSN, CDE Diabetes Coordinator Inpatient Diabetes Program [phone] (Team Pager from 8am to 5pm)

## 2016-06-04 NOTE — Progress Notes (Addendum)
Initial Nutrition Assessment  DOCUMENTATION CODES:   Non-severe (moderate) malnutrition in context of chronic illness  INTERVENTION:  D/c Ensure Enlive po BID  Start Glucerna BID due to hyperglycemia   NUTRITION DIAGNOSIS:   Malnutrition related to cancer and cancer related treatments, catabolic illness and acute on chronic COPD as evidenced by mild depletion of body fat, mild depletion of muscle mass.   GOAL:   Patient will meet greater than or equal to 90% of their needs   MONITOR:   PO intake, Labs, Weight trends, Skin  REASON FOR ASSESSMENT:   Malnutrition Screening Tool    ASSESSMENT: Lindsay Salazar  is a 66 y.o. female, With history of chronic respiratory failure on 2 L oxygen, COPD, chronic atrial fibrillation, pancreatic cancer , with recent hospital stay, just discharged yesterday for images cellulitis, and COPD, she was discharged on prednisone on clindamycin, she presents with complaints of dyspnea and cough.  Multiple admissions (9) in the past 6 months.  She is eating fairly well 50-75% of most meals thus far. At home she eats 2 meals daily on average and snacks between.  She is able to feed herself.   There has been some fluctuation in her weight which she says happens when she takes prednisone. She reports usual wt at 123# (55.9 kg).  Nutrition-Focused physical exam completed. Findings are mild-moderate fat  And muscle depletions and moderate BLE edema.    Recent Labs Lab 05/31/16 0717 06/01/16 0702 06/02/16 0418 06/03/16 0901 06/04/16 0609  NA 133* 135 136 135 134*  K 4.4 4.3 4.8 4.7 4.7  CL 102 100* 101 99* 98*  CO2 24 26 27 28 27   BUN 27* 36* 41* 36* 38*  CREATININE 1.22* 1.34* 1.32* 1.05* 1.06*  CALCIUM 8.1* 8.1* 8.1* 8.4* 8.3*  MG 1.8 1.8 1.8  --   --   PHOS 3.0 4.0 4.3  --   --   GLUCOSE 187* 181* 130* 126* 225*     Diet Order:  Diet Heart Room service appropriate? Yes; Fluid consistency: Thin  Skin:   (discoloration to right hand and  forearm)  Last BM:  5/1  Height:   Ht Readings from Last 1 Encounters:  06/03/16 5\' 4"  (1.626 m)    Weight:   Wt Readings from Last 1 Encounters:  06/03/16 120 lb (54.4 kg)    Ideal Body Weight:  54.5 kg  BMI:  Body mass index is 20.6 kg/m.  Estimated Nutritional Needs:   Kcal:  1620-1836 (30-34 kcal/kg)  Protein:  80-85 gr (1.5 gr/kg range)  Fluid:  1.6-1.8 liters daily (1 ml/kcal/day)  EDUCATION NEEDS:   No education needs identified at this time  Colman Cater MS,RD,CSG,LDN Office: [phone] Pager: [phone]

## 2016-06-05 ENCOUNTER — Ambulatory Visit (HOSPITAL_COMMUNITY): Payer: Medicare HMO

## 2016-06-05 LAB — BASIC METABOLIC PANEL
Anion gap: 9 (ref 5–15)
BUN: 38 mg/dL — AB (ref 6–20)
CHLORIDE: 95 mmol/L — AB (ref 101–111)
CO2: 28 mmol/L (ref 22–32)
Calcium: 8.5 mg/dL — ABNORMAL LOW (ref 8.9–10.3)
Creatinine, Ser: 1.03 mg/dL — ABNORMAL HIGH (ref 0.44–1.00)
GFR calc Af Amer: >60 mL/min (ref >60)
GFR calc non Af Amer: 55 mL/min — ABNORMAL LOW (ref >60)
GLUCOSE: 214 mg/dL — AB (ref 65–99)
POTASSIUM: 4.4 mmol/L (ref 3.5–5.1)
Sodium: 132 mmol/L — ABNORMAL LOW (ref 135–145)

## 2016-06-05 MED ORDER — FUROSEMIDE 10 MG/ML IJ SOLN
40.0000 mg | Freq: Once | INTRAMUSCULAR | Status: AC
  Administered 2016-06-05: 40 mg via INTRAVENOUS
  Filled 2016-06-05: qty 4

## 2016-06-05 NOTE — Care Management Note (Signed)
Case Management Note  Patient Details  Name: LANISHA STEPANIAN MRN: [id number] Date of Birth: May 25, 1950  Subjective/Objective: Adm with COPD exacerbation/PNA. From home with signficant other and roommate. Patient has home oxygen and RW PTA. Patient continues to decline [place] services.                    Action/Plan: CM discussed with patient frequent ER visits and admissions. Patient's living situation and conditions of the home seem to exacerbate her health conditions. Patient agrees. CM discussed with patient placement by way of using her Medicaid. Patient states she will "think about it" and discuss with "the others". Patient states they use her Medicaid check to help pay bills.    Expected Discharge Date:  06/05/16               Expected Discharge Plan:  Home/Self Care  In-House Referral:     Discharge planning Services  CM Consult  Post Acute Care Choice:    Choice offered to:  NA  DME Arranged:    DME Agency:     HH Arranged:    HH Agency:     Status of Service:  In process, will continue to follow  If discussed at Long Length of Stay Meetings, dates discussed:    Additional Comments:  Loriana Samad, Chauncey Reading, RN 06/05/2016, 11:17 AM

## 2016-06-05 NOTE — ED Provider Notes (Signed)
Medical screening examination/treatment/procedure(s) were conducted as a shared visit with non-physician practitioner(s) and myself.  I personally evaluated the patient during the encounter.  Discharged from hospital yesterday went home to her house where apparently it is not clean and she has an old feces all over the place. She has not were oxygen. She does not take her medications. She got worsening shortness of breath and called EMS who brought her here. On my examination she did have some breathing treatments but still is having some mild respiratory distress. Her lungs are decreased with wheezing bilaterally. No e/o edema in legs or lungs. X-ray with possible worsening pneumonia and elevated white blood cell count. I think the patient likely needs at least an observation in the Hospital make sure that this is related to noncompliance rather than worsening lung disease.   EKG Interpretation  Date/Time:  Tuesday Jun 03 2016 08:17:50 EDT Ventricular Rate:  106 PR Interval:    QRS Duration: 132 QT Interval:  384 QTC Calculation: 409 R Axis:   87 Text Interpretation:  Atrial fibrillation Nonspecific intraventricular conduction delay Anteroseptal infarct, age indeterminate since last tracing no significant change Confirmed by Daleen Bo [phone]) on 06/05/2016 12:10:29 AM         Merrily Pew, MD 06/06/16 1404

## 2016-06-05 NOTE — Progress Notes (Signed)
Received a verbal order from Dr. Roderic Palau to go ahead and give patient her pain medication, Norco 10-325 mg, Now.

## 2016-06-05 NOTE — Progress Notes (Signed)
PROGRESS NOTE    Lindsay Salazar  OTL:[id number] DOB: Jun 20, 1950 DOA: 06/03/2016 PCP: [name], PA-C    Brief Narrative:  66 year old female with nausea dependent COPD, atrial fibrillation and pancreatic cancer, who has had repeated admissions for COPD exacerbations. The patient was discharged on 4/30 after being treated for cellulitis. She came back to the hospital the following day for shortness of breath admitted for COPD exacerbation with possible healthcare associated pneumonia.   Assessment & Plan:   Active Problems:   Atrial fibrillation (HCC)   Chronic anticoagulation-Xarelto   Pancreatic cancer (HCC)   Essential hypertension   Chronic respiratory failure with hypoxia (HCC)   COPD exacerbation (HCC)   Pneumonia   Malnutrition of moderate degree   1. Chronic respiratory failure with hypoxia. She is on her baseline oxygen requirement of 3 L.  2. COPD exacerbation. Continues to have significant wheezing. Continue on bronchodilators, steroids and antibiotics.  3. Healthcare associated pneumonia. Currently on vancomycin and cefepime. Continue current treatments.  4. Chronic atrial fibrillation. Rate controlled on diltiazem and metoprolol. Anticoagulated with xarelto. Heart rate currently stable.  5. Pancreatic cancer. Follow-up with oncology for further management.   DVT prophylaxis: xarelto Code Status: full code Family Communication: no family present Disposition Plan: discharge home once improved   Consultants:     Procedures:     Antimicrobials:   Vancomycin 5/1>>  Cefepime 5/1>>   Subjective: She says she is still short of breath. She does have a cough and wheezing.  Objective: Vitals:   06/05/16 0804 06/05/16 1137 06/05/16 1413 06/05/16 1521  BP:   137/76   Pulse:   (!) 52   Resp:   18   Temp:   98.6 F (37 C)   TempSrc:   Oral   SpO2: 100% 98% 100% 99%  Weight:      Height:        Intake/Output Summary (Last 24 hours) at 06/05/16  1607 Last data filed at 06/05/16 1543  Gross per 24 hour  Intake             1440 ml  Output             1200 ml  Net              240 ml   Filed Weights   06/03/16 0818  Weight: 54.4 kg (120 lb)    Examination: General exam: Alert, awake, oriented x 3 Respiratory system: Bilateral wheezes and rhonchi Cardiovascular system:RRR. No murmurs, rubs, gallops. Gastrointestinal system: Abdomen is nondistended, soft and nontender. No organomegaly or masses felt. Normal bowel sounds heard. Central nervous system: Alert and oriented. No focal neurological deficits. Extremities: No C/C. 1-2+ edema bilaterally Skin: No rashes, lesions or ulcers Psychiatry: Judgement and insight appear normal. Mood & affect appropriate.      Data Reviewed: I have personally reviewed following labs and imaging studies  CBC:  Recent Labs Lab 05/30/16 0700 05/31/16 0717 06/01/16 0702 06/02/16 0418 06/03/16 0901 06/04/16 0609  WBC 3.9* 8.7 10.4 9.1 12.4* 7.3  NEUTROABS 3.4 7.4 9.2* 7.1 10.2*  --   HGB 9.6* 9.4* 10.0* 10.0* 10.5* 9.3*  HCT 30.1* 29.6* 31.8* 31.8* 33.4* 29.6*  MCV 96.2 97.4 98.1 98.5 99.1 98.3  PLT 310 387 430* 373 350 974   Basic Metabolic Panel:  Recent Labs Lab 05/30/16 0700 05/31/16 0717 06/01/16 0702 06/02/16 0418 06/03/16 0901 06/04/16 0609 06/05/16 0603  NA 131* 133* 135 136 135 134* 132*  K 4.7  4.4 4.3 4.8 4.7 4.7 4.4  CL 98* 102 100* 101 99* 98* 95*  CO2 23 24 26 27 28 27 28   GLUCOSE 198* 187* 181* 130* 126* 225* 214*  BUN 25* 27* 36* 41* 36* 38* 38*  CREATININE 1.28* 1.22* 1.34* 1.32* 1.05* 1.06* 1.03*  CALCIUM 8.1* 8.1* 8.1* 8.1* 8.4* 8.3* 8.5*  MG 2.3 1.8 1.8 1.8  --   --   --   PHOS 3.5 3.0 4.0 4.3  --   --   --    GFR: Estimated Creatinine Clearance: 44.9 mL/min (A) (by C-G formula based on SCr of 1.03 mg/dL (H)). Liver Function Tests:  Recent Labs Lab 05/30/16 0700 05/31/16 0717 06/01/16 0702 06/02/16 0418 06/03/16 0901  AST 18 19 19 19 24     ALT 23 21 22 22 23   ALKPHOS 111 103 102 82 86  BILITOT 0.4 0.3 0.4 0.4 0.7  PROT 5.7* 5.2* 5.5* 5.4* 5.7*  ALBUMIN 2.9* 2.7* 2.9* 2.9* 3.1*   No results for input(s): LIPASE, AMYLASE in the last 168 hours. No results for input(s): AMMONIA in the last 168 hours. Coagulation Profile: No results for input(s): INR, PROTIME in the last 168 hours. Cardiac Enzymes:  Recent Labs Lab 06/03/16 0901  TROPONINI <0.03   BNP (last 3 results) No results for input(s): PROBNP in the last 8760 hours. HbA1C: No results for input(s): HGBA1C in the last 72 hours. CBG: No results for input(s): GLUCAP in the last 168 hours. Lipid Profile: No results for input(s): CHOL, HDL, LDLCALC, TRIG, CHOLHDL, LDLDIRECT in the last 72 hours. Thyroid Function Tests: No results for input(s): TSH, T4TOTAL, FREET4, T3FREE, THYROIDAB in the last 72 hours. Anemia Panel: No results for input(s): VITAMINB12, FOLATE, FERRITIN, TIBC, IRON, RETICCTPCT in the last 72 hours. Sepsis Labs:  Recent Labs Lab 06/03/16 0901  LATICACIDVEN 1.8    Recent Results (from the past 240 hour(s))  Culture, blood (routine x 2) Call MD if unable to obtain prior to antibiotics being given     Status: None (Preliminary result)   Collection Time: 06/03/16  1:18 PM  Result Value Ref Range Status   Specimen Description BLOOD RIGHT HAND  Final   Special Requests   Final    BOTTLES DRAWN AEROBIC AND ANAEROBIC Blood Culture adequate volume   Culture NO GROWTH 2 DAYS  Final   Report Status PENDING  Incomplete  Culture, blood (routine x 2) Call MD if unable to obtain prior to antibiotics being given     Status: None (Preliminary result)   Collection Time: 06/03/16  1:18 PM  Result Value Ref Range Status   Specimen Description BLOOD RIGHT HAND  Final   Special Requests   Final    BOTTLES DRAWN AEROBIC ONLY Blood Culture results may not be optimal due to an inadequate volume of blood received in culture bottles   Culture NO GROWTH 2 DAYS   Final   Report Status PENDING  Incomplete         Radiology Studies: No results found.      Scheduled Meds: . albuterol  2.5 mg Nebulization Q4H  . diltiazem  240 mg Oral Daily  . feeding supplement (GLUCERNA SHAKE)  237 mL Oral BID BM  . furosemide  40 mg Intravenous Once  . guaiFENesin  1,200 mg Oral BID  . methylPREDNISolone (SOLU-MEDROL) injection  60 mg Intravenous Q8H  . metoprolol  50 mg Oral BID  . mometasone-formoterol  2 puff Inhalation BID  . pantoprazole  40 mg Oral Daily  . Rivaroxaban  15 mg Oral Q supper   Continuous Infusions: . ceFEPime (MAXIPIME) IV Stopped (06/05/16 0948)  . vancomycin Stopped (06/05/16 1045)     LOS: 2 days    Time spent: 15mins    Shaneisha Burkel, MD [facility] Pager [phone]  If 7PM-7AM, please contact night-coverage [email or web address] Password [email or web address] 06/05/2016, 4:07 PM

## 2016-06-06 ENCOUNTER — Other Ambulatory Visit (HOSPITAL_COMMUNITY): Payer: Self-pay | Admitting: Adult Health

## 2016-06-06 ENCOUNTER — Encounter (HOSPITAL_COMMUNITY): Payer: Self-pay | Admitting: Adult Health

## 2016-06-06 DIAGNOSIS — C257 Malignant neoplasm of other parts of pancreas: Secondary | ICD-10-CM

## 2016-06-06 LAB — BASIC METABOLIC PANEL
Anion gap: 9 (ref 5–15)
BUN: 37 mg/dL — ABNORMAL HIGH (ref 6–20)
CALCIUM: 8.4 mg/dL — AB (ref 8.9–10.3)
CO2: 28 mmol/L (ref 22–32)
Chloride: 97 mmol/L — ABNORMAL LOW (ref 101–111)
Creatinine, Ser: 0.99 mg/dL (ref 0.44–1.00)
GFR calc non Af Amer: 57 mL/min — ABNORMAL LOW (ref >60)
Glucose, Bld: 227 mg/dL — ABNORMAL HIGH (ref 65–99)
Potassium: 4.1 mmol/L (ref 3.5–5.1)
SODIUM: 134 mmol/L — AB (ref 135–145)

## 2016-06-06 MED ORDER — HYDROCODONE-ACETAMINOPHEN 10-325 MG PO TABS: 1.0000 | ORAL_TABLET | Freq: Four times a day (QID) | ORAL | 0 refills | Status: DC | PRN

## 2016-06-06 MED ORDER — PREDNISONE 10 MG (21) PO TBPK: ORAL_TABLET | ORAL | 0 refills | Status: DC

## 2016-06-06 MED ORDER — FUROSEMIDE 10 MG/ML IJ SOLN
60.0000 mg | Freq: Once | INTRAMUSCULAR | Status: AC
  Administered 2016-06-06: 60 mg via INTRAVENOUS
  Filled 2016-06-06: qty 6

## 2016-06-06 MED ORDER — HEPARIN SOD (PORK) LOCK FLUSH 100 UNIT/ML IV SOLN: 500.0000 [IU] | INTRAVENOUS | Status: DC

## 2016-06-06 MED ORDER — LEVOFLOXACIN 750 MG PO TABS: 750.0000 mg | ORAL_TABLET | Freq: Every day | ORAL | 0 refills | Status: DC

## 2016-06-06 MED ORDER — POTASSIUM CHLORIDE ER 10 MEQ PO TBCR: 10.0000 meq | EXTENDED_RELEASE_TABLET | Freq: Every day | ORAL | 0 refills | Status: DC

## 2016-06-06 MED ORDER — HEPARIN SOD (PORK) LOCK FLUSH 100 UNIT/ML IV SOLN
500.0000 [IU] | INTRAVENOUS | Status: DC | PRN
  Administered 2016-06-06: 500 [IU]
  Filled 2016-06-06: qty 5

## 2016-06-06 MED ORDER — FUROSEMIDE 40 MG PO TABS: 40.0000 mg | ORAL_TABLET | Freq: Every day | ORAL | 0 refills | Status: DC

## 2016-06-06 NOTE — Progress Notes (Signed)
Pt refusing to leave. MD paged.

## 2016-06-06 NOTE — Progress Notes (Signed)
Lindsay Salazar showed up to cancer center in a wheelchair after hours requesting refill of pain medications after recently being discharged from hospital.  Patient's significant other expressed concerns for patient's condition. Recommended they return to ED or call 911 with emergency concerns following hospitalization.  He agreed.   [place] Controlled Substance Reporting System reviewed and refill appropriate. Paper prescription given to patient's significant other.       Mike Craze, NP [place] [phone]

## 2016-06-06 NOTE — Progress Notes (Signed)
Pt discharged home with boyfriend. Discharge instructions given to both patient and boyfriend and all questions answered.

## 2016-06-06 NOTE — Discharge Summary (Signed)
Physician Discharge Summary  Lindsay Salazar [facility] ASN:[id number] DOB: 09-23-1950 DOA: 06/03/2016  PCP: [name], PA-C  Admit date: 06/03/2016 Discharge date: 06/06/2016  Admitted From: home Disposition:  home  Recommendations for Outpatient Follow-up:  1. Follow up with PCP in 1-2 weeks 2. Please obtain BMP/CBC in one week 3. Follow-up with oncology as previously scheduled  Discharge Condition: stable CODE STATUS: full code Diet recommendation: Heart Healthy    Brief/Interim Summary: 66 year old female with nausea dependent COPD, atrial fibrillation and pancreatic cancer, who has had repeated admissions for COPD exacerbations. The patient was discharged on 4/30 after being treated for cellulitis. She came back to the hospital the following day for shortness of breath admitted for COPD exacerbation with possible healthcare associated pneumonia.  Discharge Diagnoses:  Active Problems:   Atrial fibrillation (HCC)   Chronic anticoagulation-Xarelto   Pancreatic cancer (HCC)   Essential hypertension   Chronic respiratory failure with hypoxia (HCC)   COPD exacerbation (HCC)   Pneumonia   Malnutrition of moderate degree  1. Chronic respiratory failure with hypoxia. She is on her baseline oxygen requirement of 3 L.  2. COPD exacerbation. She was treated with antibiotics, intravenous steroids and bronchodilators. The patient does have some significant disease at baseline. Of note, on auscultation she is often noted to have expiratory wheezing with end forced expiration. She is told to take slow, long deep breaths, wheezing usually improves.  3. Healthcare associated pneumonia. Treated with vancomycin and cefepime. She's not having any fevers or leukocytosis. Overall respiratory status appears to be stable. She appears to be near her baseline level of functioning. She's been transitioned to oral Levaquin to complete her course.Marland Kitchen  4. Chronic atrial fibrillation. Rate controlled on diltiazem and  metoprolol. Anticoagulated with xarelto. Heart rate stable.  5. Pancreatic cancer. Follow-up with oncology for further management.  Discharge Instructions  Discharge Instructions    Diet - low sodium heart healthy    Complete by:  As directed    Increase activity slowly    Complete by:  As directed      Allergies as of 06/06/2016      Reactions   Ibuprofen Nausea And Vomiting   Other    Seafood   Penicillins Swelling   Has patient had a PCN reaction causing immediate rash, facial/tongue/throat swelling, SOB or lightheadedness with hypotension: Yes Has patient had a PCN reaction causing severe rash involving mucus membranes or skin necrosis: No Has patient had a PCN reaction that required hospitalization No Has patient had a PCN reaction occurring within the last 10 years: No If all of the above answers are "NO", then may proceed with Cephalosporin use.   Tiotropium Bromide Monohydrate Itching   Tramadol Nausea And Vomiting      Medication List    STOP taking these medications   clindamycin 300 MG capsule Commonly known as:  CLEOCIN     TAKE these medications   ABRAXANE IV Inject into the vein. Day 1, day 8, day 15, every 28 days   COMBIVENT RESPIMAT 20-100 MCG/ACT Aers respimat Generic drug:  Ipratropium-Albuterol Inhale 1 puff into the lungs 2 (two) times daily.   ipratropium-albuterol 0.5-2.5 (3) MG/3ML Soln Commonly known as:  DUONEB Inhale 3 mLs into the lungs every 6 (six) hours as needed (for shortness of breath).   diltiazem 240 MG 24 hr capsule Commonly known as:  CARDIZEM CD Take 1 capsule (240 mg total) by mouth daily.   furosemide 40 MG tablet Commonly known as:  LASIX  Take 1 tablet (40 mg total) by mouth daily.   guaiFENesin 600 MG 12 hr tablet Commonly known as:  MUCINEX Take 2 tablets (1,200 mg total) by mouth 2 (two) times daily.   levofloxacin 750 MG tablet Commonly known as:  LEVAQUIN Take 1 tablet (750 mg total) by mouth daily.    menthol-cetylpyridinium 3 MG lozenge Commonly known as:  CEPACOL Take 1 lozenge (3 mg total) by mouth as needed for sore throat.   metoprolol 50 MG tablet Commonly known as:  LOPRESSOR Take 1 tablet (50 mg total) by mouth 2 (two) times daily.   ondansetron 8 MG tablet Commonly known as:  ZOFRAN Take 1 tablet by mouth daily as needed for nausea/vomiting.   OXYGEN Inhale 2 L into the lungs daily as needed (for breathing).   pantoprazole 40 MG tablet Commonly known as:  PROTONIX Take 1 tablet (40 mg total) by mouth daily.   potassium chloride 10 MEQ tablet Commonly known as:  K-DUR Take 1 tablet (10 mEq total) by mouth daily.   predniSONE 10 MG (21) Tbpk tablet Commonly known as:  STERAPRED UNI-PAK 21 TAB Take 6 Tablets Day 1; 5 Tablets Day 2, 4 Tablets Day 3, 3 Tablets Day 4, 2 Tablets Day 5, 1 Tablet Day 6, and Stop on Day 7   prochlorperazine 10 MG tablet Commonly known as:  COMPAZINE Take 1 tablet by mouth daily as needed for nausea/vomiting.   Rivaroxaban 15 MG Tabs tablet Commonly known as:  XARELTO Take 1 tablet (15 mg total) by mouth daily with supper.   SYMBICORT 160-4.5 MCG/ACT inhaler Generic drug:  budesonide-formoterol Inhale 1 puff into the lungs 2 (two) times daily.       Allergies  Allergen Reactions  . Ibuprofen Nausea And Vomiting  . Other     Seafood   . Penicillins Swelling    Has patient had a PCN reaction causing immediate rash, facial/tongue/throat swelling, SOB or lightheadedness with hypotension: Yes Has patient had a PCN reaction causing severe rash involving mucus membranes or skin necrosis: No Has patient had a PCN reaction that required hospitalization No Has patient had a PCN reaction occurring within the last 10 years: No If all of the above answers are "NO", then may proceed with Cephalosporin use.   . Tiotropium Bromide Monohydrate Itching  . Tramadol Nausea And Vomiting    Consultations:     Procedures/Studies: Dg Chest  2 View  Result Date: 05/26/2016 CLINICAL DATA:  Cough, congestion. Bilateral lower leg swelling for 5 days. EXAM: CHEST  2 VIEW COMPARISON:  05/08/2016 FINDINGS: There is hyperinflation of the lungs compatible with COPD. Small left pleural effusion again noted, stable. Right Port-A-Cath is unchanged. Heart is normal size. No confluent opacities. IMPRESSION: COPD.  Small left pleural effusion, stable. Electronically Signed   By: Rolm Baptise M.D.   On: 05/26/2016 12:21   Dg Chest 2 View  Result Date: 05/08/2016 CLINICAL DATA:  Chest pain with shortness of breath EXAM: CHEST  2 VIEW COMPARISON:  04/01/2016 FINDINGS: Right-sided central venous port with tip projecting over the cavoatrial junction. Small bilateral pleural effusions, left greater than right. No focal consolidation. Stable cardiomediastinal silhouette with atherosclerosis. No pneumothorax. Cervical spine hardware. IMPRESSION: Small bilateral pleural effusions, left greater than right. Streaky atelectasis or infiltrate at the left lung base. Electronically Signed   By: Donavan Foil M.D.   On: 05/08/2016 17:31   Ct Chest Wo Contrast  Result Date: 05/08/2016 CLINICAL DATA:  66 year old female who presents  with shortness of breath. She has a history of pancreatic cancer, chronic respiratory failure on 2L home oxygen, COPD, PAF on Xarelto. Woke up this morning with shortness of breath and dull chest pain across the front of her chest, hx of asthma EXAM: CT CHEST WITHOUT CONTRAST TECHNIQUE: Multidetector CT imaging of the chest was performed following the standard protocol without IV contrast. COMPARISON:  Current chest radiograph.  Chest CT, 03/24/2016. FINDINGS: Cardiovascular: Heart top-normal in size. No significant coronary artery calcifications. Great vessels normal caliber. Atherosclerotic calcifications are noted along the thoracic aorta and at the origin of the aortic arch branch vessels. Mediastinum/Nodes: No neck base or axillary masses or  adenopathy. No mediastinal or hilar masses or pathologically enlarged lymph nodes. Trachea is widely patent. Esophagus is unremarkable. Lungs/Pleura: Small to moderate-sized left pleural effusion. No right pleural effusion. Moderate emphysema. There is apical pleuroparenchymal scarring. Reticular opacities with mild intervening ground-glass opacities noted in the anterior upper lobes, likely scarring. There is focal opacity in the posterior inferior left upper lobe lingula, most likely atelectasis. Linear opacity noted at the left lung base is also likely atelectasis. There is bronchial wall thickening bilaterally most evident in the lower lobes, left greater than right. No lung masses or suspicious nodules. No evidence of pneumonia. Mild interstitial thickening is noted in the lower lungs, chronic. No evidence of pulmonary edema. Upper Abdomen: No acute findings. Large left liver lobe cyst measuring 6.7 cm, without change. Musculoskeletal: No fracture or acute finding. No osteoblastic or osteolytic lesions. Right anterior chest wall Port-A-Cath has its catheter tip in the right atrium. IMPRESSION: 1. No evidence of pneumonia. 2. Small to moderate left pleural effusion. There is bronchial wall thickening most prominent in the left lower lobe. A component of acute bronchitis should be considered. 3. Emphysema and areas of lung scarring with mild interstitial thickening, similar to the prior chest CT. Electronically Signed   By: Lajean Manes M.D.   On: 05/08/2016 18:59   Dg Chest Portable 1 View  Result Date: 06/03/2016 CLINICAL DATA:  Shortness of breath. EXAM: PORTABLE CHEST 1 VIEW COMPARISON:  Radiographs of May 26, 2016. FINDINGS: Stable cardiomediastinal silhouette. Atherosclerosis of thoracic aorta is noted. Right internal jugular catheter is noted with distal tip in expected position of SVC. No pneumothorax is noted. Mild central pulmonary vascular congestion is noted. Increased left basilar opacity is  noted concerning for worsening atelectasis or infiltrate with associated pleural effusion. Bony thorax is unremarkable. IMPRESSION: Increased left basilar opacity is noted concerning for worsening atelectasis or infiltrate with associated pleural effusion. Aortic atherosclerosis. Mild central pulmonary vascular congestion. Electronically Signed   By: Marijo Conception, M.D.   On: 06/03/2016 09:01      Subjective: Shortness of breath improving. Lower extremity edema also improving.  Discharge Exam: Vitals:   06/05/16 2220 06/06/16 0731  BP: (!) 162/100 (!) 158/88  Pulse: 88 76  Resp: 18 18  Temp: 97.8 F (36.6 C) 98.5 F (36.9 C)   Vitals:   06/05/16 2343 06/06/16 0731 06/06/16 0940 06/06/16 1324  BP:  (!) 158/88    Pulse:  76    Resp:  18    Temp:  98.5 F (36.9 C)    TempSrc:  Oral    SpO2: 96% 100% 98% 98%  Weight:      Height:        General: Pt is alert, awake, not in acute distress Cardiovascular: RRR, S1/S2 +, no rubs, no gallops Respiratory: CTA bilaterally, no wheezing,  no rhonchi Abdominal: Soft, NT, ND, bowel sounds + Extremities: Mild edema in the lower extremities bilaterally    The results of significant diagnostics from this hospitalization (including imaging, microbiology, ancillary and laboratory) are listed below for reference.     Microbiology: Recent Results (from the past 240 hour(s))  Culture, blood (routine x 2) Call MD if unable to obtain prior to antibiotics being given     Status: None (Preliminary result)   Collection Time: 06/03/16  1:18 PM  Result Value Ref Range Status   Specimen Description BLOOD RIGHT HAND  Final   Special Requests   Final    BOTTLES DRAWN AEROBIC AND ANAEROBIC Blood Culture adequate volume   Culture NO GROWTH 3 DAYS  Final   Report Status PENDING  Incomplete  Culture, blood (routine x 2) Call MD if unable to obtain prior to antibiotics being given     Status: None (Preliminary result)   Collection Time: 06/03/16  1:18  PM  Result Value Ref Range Status   Specimen Description BLOOD RIGHT HAND  Final   Special Requests   Final    BOTTLES DRAWN AEROBIC ONLY Blood Culture results may not be optimal due to an inadequate volume of blood received in culture bottles   Culture NO GROWTH 3 DAYS  Final   Report Status PENDING  Incomplete     Labs: BNP (last 3 results)  Recent Labs  03/24/16 1750 05/08/16 1645 06/03/16 0909  BNP 394.0* 711.0* 401.0*   Basic Metabolic Panel:  Recent Labs Lab 05/31/16 0717 06/01/16 0702 06/02/16 0418 06/03/16 0901 06/04/16 0609 06/05/16 0603 06/06/16 0432  NA 133* 135 136 135 134* 132* 134*  K 4.4 4.3 4.8 4.7 4.7 4.4 4.1  CL 102 100* 101 99* 98* 95* 97*  CO2 24 26 27 28 27 28 28   GLUCOSE 187* 181* 130* 126* 225* 214* 227*  BUN 27* 36* 41* 36* 38* 38* 37*  CREATININE 1.22* 1.34* 1.32* 1.05* 1.06* 1.03* 0.99  CALCIUM 8.1* 8.1* 8.1* 8.4* 8.3* 8.5* 8.4*  MG 1.8 1.8 1.8  --   --   --   --   PHOS 3.0 4.0 4.3  --   --   --   --    Liver Function Tests:  Recent Labs Lab 05/31/16 0717 06/01/16 0702 06/02/16 0418 06/03/16 0901  AST 19 19 19 24   ALT 21 22 22 23   ALKPHOS 103 102 82 86  BILITOT 0.3 0.4 0.4 0.7  PROT 5.2* 5.5* 5.4* 5.7*  ALBUMIN 2.7* 2.9* 2.9* 3.1*   No results for input(s): LIPASE, AMYLASE in the last 168 hours. No results for input(s): AMMONIA in the last 168 hours. CBC:  Recent Labs Lab 05/31/16 0717 06/01/16 0702 06/02/16 0418 06/03/16 0901 06/04/16 0609  WBC 8.7 10.4 9.1 12.4* 7.3  NEUTROABS 7.4 9.2* 7.1 10.2*  --   HGB 9.4* 10.0* 10.0* 10.5* 9.3*  HCT 29.6* 31.8* 31.8* 33.4* 29.6*  MCV 97.4 98.1 98.5 99.1 98.3  PLT 387 430* 373 350 318   Cardiac Enzymes:  Recent Labs Lab 06/03/16 0901  TROPONINI <0.03   BNP: Invalid input(s): POCBNP CBG: No results for input(s): GLUCAP in the last 168 hours. D-Dimer No results for input(s): DDIMER in the last 72 hours. Hgb A1c No results for input(s): HGBA1C in the last 72  hours. Lipid Profile No results for input(s): CHOL, HDL, LDLCALC, TRIG, CHOLHDL, LDLDIRECT in the last 72 hours. Thyroid function studies No results for input(s): TSH, T4TOTAL, T3FREE,  THYROIDAB in the last 72 hours.  Invalid input(s): FREET3 Anemia work up No results for input(s): VITAMINB12, FOLATE, FERRITIN, TIBC, IRON, RETICCTPCT in the last 72 hours. Urinalysis    Component Value Date/Time   COLORURINE YELLOW 06/03/2016 2100   APPEARANCEUR CLEAR 06/03/2016 2100   LABSPEC 1.025 06/03/2016 2100   PHURINE 5.0 06/03/2016 2100   GLUCOSEU 50 (A) 06/03/2016 2100   HGBUR NEGATIVE 06/03/2016 2100   BILIRUBINUR NEGATIVE 06/03/2016 2100   [place] NEGATIVE 06/03/2016 2100   PROTEINUR NEGATIVE 06/03/2016 2100   NITRITE NEGATIVE 06/03/2016 2100   LEUKOCYTESUR NEGATIVE 06/03/2016 2100   Sepsis Labs Invalid input(s): PROCALCITONIN,  WBC,  LACTICIDVEN Microbiology Recent Results (from the past 240 hour(s))  Culture, blood (routine x 2) Call MD if unable to obtain prior to antibiotics being given     Status: None (Preliminary result)   Collection Time: 06/03/16  1:18 PM  Result Value Ref Range Status   Specimen Description BLOOD RIGHT HAND  Final   Special Requests   Final    BOTTLES DRAWN AEROBIC AND ANAEROBIC Blood Culture adequate volume   Culture NO GROWTH 3 DAYS  Final   Report Status PENDING  Incomplete  Culture, blood (routine x 2) Call MD if unable to obtain prior to antibiotics being given     Status: None (Preliminary result)   Collection Time: 06/03/16  1:18 PM  Result Value Ref Range Status   Specimen Description BLOOD RIGHT HAND  Final   Special Requests   Final    BOTTLES DRAWN AEROBIC ONLY Blood Culture results may not be optimal due to an inadequate volume of blood received in culture bottles   Culture NO GROWTH 3 DAYS  Final   Report Status PENDING  Incomplete     Time coordinating discharge: Over 30 minutes  SIGNED:   Kathie Dike, MD  [facility] 06/06/2016, 7:32 PM Pager   If 7PM-7AM, please contact night-coverage [email or web address] Password [email or web address]

## 2016-06-08 ENCOUNTER — Inpatient Hospital Stay (HOSPITAL_COMMUNITY)
Admission: EM | Admit: 2016-06-08 | Discharge: 2016-06-10 | DRG: 189 | Disposition: A | Discharge status: Skilled Nursing Facility | Payer: Medicare HMO | Attending: Internal Medicine | Admitting: Internal Medicine

## 2016-06-08 ENCOUNTER — Emergency Department (HOSPITAL_COMMUNITY): Payer: Medicare HMO

## 2016-06-08 ENCOUNTER — Encounter (HOSPITAL_COMMUNITY): Payer: Self-pay | Admitting: Emergency Medicine

## 2016-06-08 ENCOUNTER — Other Ambulatory Visit: Payer: Self-pay

## 2016-06-08 DIAGNOSIS — Z7901 Long term (current) use of anticoagulants: Secondary | ICD-10-CM | POA: Diagnosis not present

## 2016-06-08 DIAGNOSIS — R627 Adult failure to thrive: Secondary | ICD-10-CM | POA: Diagnosis present

## 2016-06-08 DIAGNOSIS — M1990 Unspecified osteoarthritis, unspecified site: Secondary | ICD-10-CM | POA: Diagnosis present

## 2016-06-08 DIAGNOSIS — Z981 Arthrodesis status: Secondary | ICD-10-CM

## 2016-06-08 DIAGNOSIS — D638 Anemia in other chronic diseases classified elsewhere: Secondary | ICD-10-CM | POA: Diagnosis present

## 2016-06-08 DIAGNOSIS — G8929 Other chronic pain: Secondary | ICD-10-CM | POA: Diagnosis present

## 2016-06-08 DIAGNOSIS — C259 Malignant neoplasm of pancreas, unspecified: Secondary | ICD-10-CM | POA: Diagnosis present

## 2016-06-08 DIAGNOSIS — Z91013 Allergy to seafood: Secondary | ICD-10-CM

## 2016-06-08 DIAGNOSIS — I4891 Unspecified atrial fibrillation: Secondary | ICD-10-CM | POA: Diagnosis present

## 2016-06-08 DIAGNOSIS — I482 Chronic atrial fibrillation: Secondary | ICD-10-CM | POA: Diagnosis present

## 2016-06-08 DIAGNOSIS — Z201 Contact with and (suspected) exposure to tuberculosis: Secondary | ICD-10-CM | POA: Diagnosis present

## 2016-06-08 DIAGNOSIS — R5381 Other malaise: Secondary | ICD-10-CM | POA: Diagnosis present

## 2016-06-08 DIAGNOSIS — I10 Essential (primary) hypertension: Secondary | ICD-10-CM | POA: Diagnosis present

## 2016-06-08 DIAGNOSIS — Z6823 Body mass index (BMI) 23.0-23.9, adult: Secondary | ICD-10-CM

## 2016-06-08 DIAGNOSIS — J9622 Acute and chronic respiratory failure with hypercapnia: Secondary | ICD-10-CM | POA: Diagnosis present

## 2016-06-08 DIAGNOSIS — E440 Moderate protein-calorie malnutrition: Secondary | ICD-10-CM | POA: Diagnosis present

## 2016-06-08 DIAGNOSIS — Z9981 Dependence on supplemental oxygen: Secondary | ICD-10-CM | POA: Diagnosis not present

## 2016-06-08 DIAGNOSIS — C254 Malignant neoplasm of endocrine pancreas: Secondary | ICD-10-CM | POA: Diagnosis not present

## 2016-06-08 DIAGNOSIS — I480 Paroxysmal atrial fibrillation: Secondary | ICD-10-CM | POA: Diagnosis present

## 2016-06-08 DIAGNOSIS — M797 Fibromyalgia: Secondary | ICD-10-CM | POA: Diagnosis present

## 2016-06-08 DIAGNOSIS — F17200 Nicotine dependence, unspecified, uncomplicated: Secondary | ICD-10-CM | POA: Diagnosis present

## 2016-06-08 DIAGNOSIS — K219 Gastro-esophageal reflux disease without esophagitis: Secondary | ICD-10-CM | POA: Diagnosis present

## 2016-06-08 DIAGNOSIS — M25551 Pain in right hip: Secondary | ICD-10-CM | POA: Diagnosis present

## 2016-06-08 DIAGNOSIS — Z79899 Other long term (current) drug therapy: Secondary | ICD-10-CM | POA: Diagnosis not present

## 2016-06-08 DIAGNOSIS — J9621 Acute and chronic respiratory failure with hypoxia: Principal | ICD-10-CM | POA: Diagnosis present

## 2016-06-08 DIAGNOSIS — Z886 Allergy status to analgesic agent status: Secondary | ICD-10-CM

## 2016-06-08 DIAGNOSIS — R51 Headache: Secondary | ICD-10-CM | POA: Diagnosis present

## 2016-06-08 DIAGNOSIS — Z885 Allergy status to narcotic agent status: Secondary | ICD-10-CM

## 2016-06-08 DIAGNOSIS — Z86718 Personal history of other venous thrombosis and embolism: Secondary | ICD-10-CM

## 2016-06-08 DIAGNOSIS — Z880 Allergy status to penicillin: Secondary | ICD-10-CM

## 2016-06-08 DIAGNOSIS — C257 Malignant neoplasm of other parts of pancreas: Secondary | ICD-10-CM

## 2016-06-08 DIAGNOSIS — J441 Chronic obstructive pulmonary disease with (acute) exacerbation: Secondary | ICD-10-CM | POA: Diagnosis present

## 2016-06-08 DIAGNOSIS — Z7951 Long term (current) use of inhaled steroids: Secondary | ICD-10-CM

## 2016-06-08 DIAGNOSIS — Z888 Allergy status to other drugs, medicaments and biological substances status: Secondary | ICD-10-CM

## 2016-06-08 DIAGNOSIS — Z9114 Patient's other noncompliance with medication regimen: Secondary | ICD-10-CM

## 2016-06-08 LAB — BRAIN NATRIURETIC PEPTIDE: B NATRIURETIC PEPTIDE 5: 876 pg/mL — AB (ref 0.0–100.0)

## 2016-06-08 LAB — BASIC METABOLIC PANEL
Anion gap: 8 (ref 5–15)
BUN: 35 mg/dL — AB (ref 6–20)
CHLORIDE: 100 mmol/L — AB (ref 101–111)
CO2: 35 mmol/L — AB (ref 22–32)
CREATININE: 0.86 mg/dL (ref 0.44–1.00)
Calcium: 8.9 mg/dL (ref 8.9–10.3)
GFR calc non Af Amer: >60 mL/min (ref >60)
Glucose, Bld: 98 mg/dL (ref 65–99)
Potassium: 3.8 mmol/L (ref 3.5–5.1)
Sodium: 143 mmol/L (ref 135–145)

## 2016-06-08 LAB — CBC WITH DIFFERENTIAL/PLATELET
BASOS ABS: 0.1 10*3/uL (ref 0.0–0.1)
Basophils Relative: 0 %
EOS ABS: 0 10*3/uL (ref 0.0–0.7)
Eosinophils Relative: 0 %
HCT: 32.6 % — ABNORMAL LOW (ref 36.0–46.0)
Hemoglobin: 10.2 g/dL — ABNORMAL LOW (ref 12.0–15.0)
Lymphocytes Relative: 4 %
Lymphs Abs: 0.8 10*3/uL (ref 0.7–4.0)
MCH: 31.1 pg (ref 26.0–34.0)
MCHC: 31.3 g/dL (ref 30.0–36.0)
MCV: 99.4 fL (ref 78.0–100.0)
Monocytes Absolute: 1.6 10*3/uL — ABNORMAL HIGH (ref 0.1–1.0)
Monocytes Relative: 8 %
NEUTROS PCT: 88 %
Neutro Abs: 18.1 10*3/uL — ABNORMAL HIGH (ref 1.7–7.7)
PLATELETS: 165 10*3/uL (ref 150–400)
RBC: 3.28 MIL/uL — AB (ref 3.87–5.11)
RDW: 17.9 % — ABNORMAL HIGH (ref 11.5–15.5)
WBC: 20.6 10*3/uL — AB (ref 4.0–10.5)

## 2016-06-08 LAB — I-STAT TROPONIN, ED: Troponin i, poc: 0.03 ng/mL (ref 0.00–0.08)

## 2016-06-08 LAB — GLUCOSE, CAPILLARY: Glucose-Capillary: 175 mg/dL — ABNORMAL HIGH (ref 65–99)

## 2016-06-08 MED ORDER — DOCUSATE SODIUM 100 MG PO CAPS
100.0000 mg | ORAL_CAPSULE | Freq: Two times a day (BID) | ORAL | Status: DC
  Administered 2016-06-08 – 2016-06-10 (×4): 100 mg via ORAL
  Filled 2016-06-08 (×4): qty 1

## 2016-06-08 MED ORDER — DILTIAZEM HCL 60 MG PO TABS
120.0000 mg | ORAL_TABLET | Freq: Two times a day (BID) | ORAL | Status: DC
  Administered 2016-06-08 – 2016-06-10 (×4): 120 mg via ORAL
  Filled 2016-06-08 (×4): qty 2

## 2016-06-08 MED ORDER — HYDROCODONE-ACETAMINOPHEN 10-325 MG PO TABS
1.0000 | ORAL_TABLET | Freq: Four times a day (QID) | ORAL | Status: DC | PRN
  Administered 2016-06-08 – 2016-06-10 (×8): 1 via ORAL
  Filled 2016-06-08 (×8): qty 1

## 2016-06-08 MED ORDER — SODIUM CHLORIDE 0.9% FLUSH
3.0000 mL | Freq: Two times a day (BID) | INTRAVENOUS | Status: DC
  Administered 2016-06-08 – 2016-06-10 (×4): 3 mL via INTRAVENOUS

## 2016-06-08 MED ORDER — ACETAMINOPHEN 325 MG PO TABS: 650.0000 mg | ORAL_TABLET | Freq: Four times a day (QID) | ORAL | Status: DC | PRN

## 2016-06-08 MED ORDER — PANTOPRAZOLE SODIUM 40 MG PO TBEC
40.0000 mg | DELAYED_RELEASE_TABLET | Freq: Every day | ORAL | Status: DC
  Administered 2016-06-09 – 2016-06-10 (×2): 40 mg via ORAL
  Filled 2016-06-08 (×2): qty 1

## 2016-06-08 MED ORDER — INSULIN ASPART 100 UNIT/ML ~~LOC~~ SOLN
0.0000 [IU] | Freq: Three times a day (TID) | SUBCUTANEOUS | Status: DC
  Administered 2016-06-09: 2 [IU] via SUBCUTANEOUS
  Administered 2016-06-09 – 2016-06-10 (×4): 3 [IU] via SUBCUTANEOUS

## 2016-06-08 MED ORDER — FUROSEMIDE 40 MG PO TABS
40.0000 mg | ORAL_TABLET | Freq: Every day | ORAL | Status: DC
  Administered 2016-06-09 – 2016-06-10 (×2): 40 mg via ORAL
  Filled 2016-06-08 (×3): qty 1

## 2016-06-08 MED ORDER — METOPROLOL TARTRATE 5 MG/5ML IV SOLN: 5.0000 mg | Freq: Four times a day (QID) | INTRAVENOUS | Status: DC | PRN

## 2016-06-08 MED ORDER — ENSURE ENLIVE PO LIQD
237.0000 mL | Freq: Two times a day (BID) | ORAL | Status: DC
  Administered 2016-06-09 (×2): 237 mL via ORAL

## 2016-06-08 MED ORDER — RIVAROXABAN 15 MG PO TABS
15.0000 mg | ORAL_TABLET | Freq: Every day | ORAL | Status: DC
Start: 2016-06-09 — End: 2016-06-10
  Administered 2016-06-09: 15 mg via ORAL
  Filled 2016-06-08: qty 1

## 2016-06-08 MED ORDER — HYDROCODONE-ACETAMINOPHEN 5-325 MG PO TABS
1.0000 | ORAL_TABLET | Freq: Once | ORAL | Status: AC
  Administered 2016-06-08: 1 via ORAL
  Filled 2016-06-08: qty 1

## 2016-06-08 MED ORDER — ALBUTEROL SULFATE (2.5 MG/3ML) 0.083% IN NEBU
5.0000 mg | INHALATION_SOLUTION | Freq: Once | RESPIRATORY_TRACT | Status: AC
  Administered 2016-06-08: 5 mg via RESPIRATORY_TRACT
  Filled 2016-06-08: qty 6

## 2016-06-08 MED ORDER — MENTHOL 3 MG MT LOZG: 1.0000 | LOZENGE | OROMUCOSAL | Status: DC | PRN

## 2016-06-08 MED ORDER — METOPROLOL TARTRATE 50 MG PO TABS
50.0000 mg | ORAL_TABLET | Freq: Two times a day (BID) | ORAL | Status: DC
  Administered 2016-06-08 – 2016-06-10 (×4): 50 mg via ORAL
  Filled 2016-06-08 (×4): qty 1

## 2016-06-08 MED ORDER — DILTIAZEM HCL ER COATED BEADS 240 MG PO CP24
240.0000 mg | ORAL_CAPSULE | Freq: Every day | ORAL | Status: DC
  Administered 2016-06-08: 240 mg via ORAL
  Filled 2016-06-08 (×2): qty 1

## 2016-06-08 MED ORDER — POTASSIUM CHLORIDE CRYS ER 10 MEQ PO TBCR
10.0000 meq | EXTENDED_RELEASE_TABLET | Freq: Every day | ORAL | Status: DC
  Administered 2016-06-09 – 2016-06-10 (×2): 10 meq via ORAL
  Filled 2016-06-08 (×3): qty 1

## 2016-06-08 MED ORDER — IPRATROPIUM-ALBUTEROL 0.5-2.5 (3) MG/3ML IN SOLN
3.0000 mL | Freq: Four times a day (QID) | RESPIRATORY_TRACT | Status: DC
  Administered 2016-06-08 – 2016-06-10 (×7): 3 mL via RESPIRATORY_TRACT
  Filled 2016-06-08 (×7): qty 3

## 2016-06-08 MED ORDER — METHYLPREDNISOLONE SODIUM SUCC 125 MG IJ SOLR: 80.0000 mg | Freq: Once | INTRAMUSCULAR | Status: DC

## 2016-06-08 MED ORDER — GUAIFENESIN ER 600 MG PO TB12
1200.0000 mg | ORAL_TABLET | Freq: Two times a day (BID) | ORAL | Status: DC
  Administered 2016-06-08 – 2016-06-10 (×4): 1200 mg via ORAL
  Filled 2016-06-08 (×4): qty 2

## 2016-06-08 MED ORDER — ACETAMINOPHEN 650 MG RE SUPP: 650.0000 mg | Freq: Four times a day (QID) | RECTAL | Status: DC | PRN

## 2016-06-08 MED ORDER — METHYLPREDNISOLONE SODIUM SUCC 125 MG IJ SOLR
60.0000 mg | Freq: Two times a day (BID) | INTRAMUSCULAR | Status: DC
  Administered 2016-06-08 – 2016-06-10 (×4): 60 mg via INTRAVENOUS
  Filled 2016-06-08 (×4): qty 2

## 2016-06-08 MED ORDER — LEVOFLOXACIN 750 MG PO TABS
750.0000 mg | ORAL_TABLET | Freq: Every day | ORAL | Status: DC
  Administered 2016-06-08 – 2016-06-10 (×3): 750 mg via ORAL
  Filled 2016-06-08 (×3): qty 1

## 2016-06-08 MED ORDER — ONDANSETRON HCL 4 MG/2ML IJ SOLN: 4.0000 mg | Freq: Four times a day (QID) | INTRAMUSCULAR | Status: DC | PRN

## 2016-06-08 MED ORDER — PROCHLORPERAZINE MALEATE 5 MG PO TABS: 10.0000 mg | ORAL_TABLET | Freq: Four times a day (QID) | ORAL | Status: DC | PRN

## 2016-06-08 MED ORDER — ONDANSETRON HCL 4 MG PO TABS: 4.0000 mg | ORAL_TABLET | Freq: Four times a day (QID) | ORAL | Status: DC | PRN

## 2016-06-08 MED ORDER — INSULIN ASPART 100 UNIT/ML ~~LOC~~ SOLN: 0.0000 [IU] | Freq: Every day | SUBCUTANEOUS | Status: DC

## 2016-06-08 MED ORDER — MOMETASONE FURO-FORMOTEROL FUM 200-5 MCG/ACT IN AERO
2.0000 | INHALATION_SPRAY | Freq: Two times a day (BID) | RESPIRATORY_TRACT | Status: DC
  Administered 2016-06-09 – 2016-06-10 (×3): 2 via RESPIRATORY_TRACT
  Filled 2016-06-08: qty 8.8

## 2016-06-08 NOTE — ED Triage Notes (Signed)
Pt returns for shortness of breath.  Given Albuterol x2 and Solumedrol 125mg  by ems.

## 2016-06-08 NOTE — ED Provider Notes (Signed)
[facility] DEPT Provider Note   CSN: [id number] Arrival date & time: 06/08/16  1028     History   Chief Complaint Chief Complaint  Patient presents with  . Shortness of Breath    HPI Lindsay Salazar is a 66 y.o. female.  HPI   Lindsay Salazar is a 66 y.o. female with a PMHx of COPD, pancreatic cancer and atrial fibrillation, anticoagulated with Xarelto. She presents to the Emergency Department complaining of increased shortness of breath for one day.  She states that she was outside yesterday and believes that the pollen exacerbated her wheezing and shortness of breath.  She was discharged two days ago for COPD exacerbation and pneumonia.  She was prescribed Levaquin and steroid which she states she is taking.  She also complains of wheezing and chest tightness.  Using her oxygen at home without improvement.  She denies fever, abdominal pain, or increased swelling of her lower extremities.   Past Medical History:  Diagnosis Date  . Anemia of chronic disease   . Arthritis    bursitis , fibromyalgia  . Arthritis    "right hip" (11/27/2015)  . Asthma   . Chronic bronchitis ([place])   . Chronic respiratory failure ([place])   . Chronic right hip pain   . COPD (chronic obstructive pulmonary disease) ([place])    wears home o2 prn  . Daily headache    "last 2-3 months" (11/27/2015)  . DVT (deep venous thrombosis) ([place]) 03/2016  . Exposure to TB    "mom had it when she was pregnant with me"  . Fibromyalgia   . GERD (gastroesophageal reflux disease)    use to have it but not now  . Heart murmur    36-  59 years old  . Hyperkalemia   . Hypertension   . On home oxygen therapy    11/27/2015 "whenever I need it; don't know how many liters"  . PAF (paroxysmal atrial fibrillation) ([place])   . Pancreatic cancer ([place])   . Paroxysmal atrial flutter ([place])   . Pneumonia    "several times" (11/27/2015)  . Pneumothorax     Patient Active Problem List   Diagnosis Date Noted  . Malnutrition of  moderate degree 06/04/2016  . Pneumonia 06/03/2016  . Cellulitis of left foot 05/26/2016  . COPD exacerbation ([place]) 05/09/2016  . Cellulitis 04/26/2016  . Chronic respiratory failure with hypoxia ([place]) 04/26/2016  . Hypoxemia 03/30/2016  . Acute on chronic respiratory failure with hypoxia ([place]) 03/29/2016  . Leukocytosis 03/29/2016  . Anemia of chronic disease 03/29/2016  . Essential hypertension 03/29/2016  . HCAP (healthcare-associated pneumonia) 03/28/2016  . B12 deficiency 01/24/2016  . COPD with acute exacerbation ([place]) 01/20/2016  . Pancreatic cancer ([place]) 12/07/2015  . Chronic anticoagulation-Xarelto 09/06/2015  . Gastrointestinal hemorrhage with melena 08/08/2015  . Atrial fibrillation ([place]) 07/17/2015    Past Surgical History:  Procedure Laterality Date  . ANTERIOR CERVICAL DECOMP/DISCECTOMY FUSION     "put 4 screws in"  . BACK SURGERY    . CARDIAC CATHETERIZATION  08/2004   Archie Endo 06/18/2010  . CARDIOVERSION N/A 07/18/2015   Procedure: CARDIOVERSION;  Surgeon: Josue Hector, MD;  Location: [facility] ENDO SUITE;  Service: Cardiovascular;  Laterality: N/A;  . ESOPHAGOGASTRODUODENOSCOPY N/A 08/09/2015   Procedure: ESOPHAGOGASTRODUODENOSCOPY (EGD);  Surgeon: Rogene Houston, MD;  Location: [facility] ENDO SUITE;  Service: Endoscopy;  Laterality: N/A;  . EUS N/A 11/29/2015   Procedure: UPPER ENDOSCOPIC ULTRASOUND (EUS) RADIAL;  Surgeon: Milus Banister, MD;  Location: [facility] ENDOSCOPY;  Service: Endoscopy;  Laterality: N/A;  . IR GENERIC HISTORICAL  12/18/2015   IR US GUIDE VASC ACCESS RIGHT 12/18/2015 Sandi Mariscal, MD [facility]  . IR GENERIC HISTORICAL  12/18/2015   IR FLUORO GUIDE PORT INSERTION RIGHT 12/18/2015 Sandi Mariscal, MD [facility]  . TONSILLECTOMY    . TUBAL LIGATION      OB History    Gravida Para Term Preterm AB Living   2         2   SAB TAB Ectopic Multiple Live Births                   Home Medications    Prior to Admission medications   Medication Sig Start  Date End Date Taking? Authorizing Provider  COMBIVENT RESPIMAT 20-100 MCG/ACT AERS respimat Inhale 1 puff into the lungs 2 (two) times daily. 12/03/15   Rai, Ripudeep Raliegh Ip, MD  diltiazem (CARDIZEM CD) 240 MG 24 hr capsule Take 1 capsule (240 mg total) by mouth daily. 05/23/16 08/21/16  Lendon Colonel, NP  furosemide (LASIX) 40 MG tablet Take 1 tablet (40 mg total) by mouth daily. 06/06/16   Kathie Dike, MD  guaiFENesin (MUCINEX) 600 MG 12 hr tablet Take 2 tablets (1,200 mg total) by mouth 2 (two) times daily. 06/02/16   Raiford Noble Latif, DO  HYDROcodone-acetaminophen (NORCO) 10-325 MG tablet Take 1 tablet by mouth every 6 (six) hours as needed. 06/06/16   Holley Bouche, NP  ipratropium-albuterol (DUONEB) 0.5-2.5 (3) MG/3ML SOLN Inhale 3 mLs into the lungs every 6 (six) hours as needed (for shortness of breath).  03/03/16   [provider]  levofloxacin (LEVAQUIN) 750 MG tablet Take 1 tablet (750 mg total) by mouth daily. 06/06/16   Kathie Dike, MD  menthol-cetylpyridinium (CEPACOL) 3 MG lozenge Take 1 lozenge (3 mg total) by mouth as needed for sore throat. 06/02/16   Raiford Noble Latif, DO  metoprolol tartrate (LOPRESSOR) 50 MG tablet Take 1 tablet (50 mg total) by mouth 2 (two) times daily. 03/02/16   Kathie Dike, MD  ondansetron (ZOFRAN) 8 MG tablet Take 1 tablet by mouth daily as needed for nausea/vomiting. 12/10/15   [provider]  OXYGEN Inhale 2 L into the lungs daily as needed (for breathing).    [provider]  PACLitaxel Protein-Bound Part (ABRAXANE IV) Inject into the vein. Day 1, day 8, day 15, every 28 days    [provider]  pantoprazole (PROTONIX) 40 MG tablet Take 1 tablet (40 mg total) by mouth daily. 12/03/15   Rai, Ripudeep K, MD  potassium chloride (K-DUR) 10 MEQ tablet Take 1 tablet (10 mEq total) by mouth daily. 06/06/16   Kathie Dike, MD  predniSONE (STERAPRED UNI-PAK 21 TAB) 10 MG (21) TBPK tablet Take 6 Tablets Day 1; 5  Tablets Day 2, 4 Tablets Day 3, 3 Tablets Day 4, 2 Tablets Day 5, 1 Tablet Day 6, and Stop on Day 7 06/06/16   Kathie Dike, MD  prochlorperazine (COMPAZINE) 10 MG tablet Take 1 tablet by mouth daily as needed for nausea/vomiting. 12/10/15   [provider]  Rivaroxaban (XARELTO) 15 MG TABS tablet Take 1 tablet (15 mg total) by mouth daily with supper. 05/01/16   Orson Eva, MD  SYMBICORT 160-4.5 MCG/ACT inhaler Inhale 1 puff into the lungs 2 (two) times daily. 12/03/15   Mendel Corning, MD    Family History Family History  Problem Relation Age of Onset  . COPD  Mother   . Diabetes Father   . Stroke Father     Social History Social History  Substance Use Topics  . Smoking status: Former Smoker    Packs/day: 0.50    Years: 46.00    Types: Cigarettes    Start date: 10/09/1969    Quit date: 11/29/2015  . Smokeless tobacco: Never Used  . Alcohol use No     Comment: quit 30 years ago     Allergies   Ibuprofen; Other; Penicillins; Tiotropium bromide monohydrate; and Tramadol   Review of Systems Review of Systems  Constitutional: Negative for appetite change, chills and fever.  HENT: Negative for congestion, sore throat and trouble swallowing.   Respiratory: Positive for chest tightness, shortness of breath and wheezing. Negative for cough.   Cardiovascular: Negative for chest pain.  Gastrointestinal: Negative for abdominal pain, nausea and vomiting.  Genitourinary: Negative for dysuria.  Musculoskeletal: Negative for arthralgias and neck pain.  Skin: Negative for rash.  Neurological: Negative for dizziness, weakness and numbness.  Hematological: Negative for adenopathy.  All other systems reviewed and are negative.    Physical Exam Updated Vital Signs BP (!) 133/95   Pulse (!) 103   Temp 98.2 F (36.8 C) (Oral)   Resp (!) 22   Ht 5\' 4"  (1.626 m)   Wt 54.4 kg   SpO2 96%   BMI 20.60 kg/m   Physical Exam  Constitutional: She is oriented to person, place,  and time. She appears well-developed and well-nourished. No distress.  HENT:  Head: Normocephalic and atraumatic.  Right Ear: Tympanic membrane and ear canal normal.  Left Ear: Tympanic membrane and ear canal normal.  Mouth/Throat: Uvula is midline and oropharynx is clear and moist. Mucous membranes are dry. No oropharyngeal exudate.  Eyes: Conjunctivae and EOM are normal. Pupils are equal, round, and reactive to light.  Neck: Normal range of motion, full passive range of motion without pain and phonation normal. Neck supple. No JVD present.  Cardiovascular: Intact distal pulses.  An irregularly irregular rhythm present. Tachycardia present.   No murmur heard. Pulmonary/Chest: No stridor. She is in respiratory distress. She has wheezes. She has no rales. She exhibits no tenderness.  Diminished lungs sounds bilaterally, expiratory wheezes.  No coarse rales.  On O2 by [place] on arrival  Abdominal: Soft. She exhibits no distension and no mass. There is no tenderness. There is no guarding.  Musculoskeletal: She exhibits edema.  2+ pitting edema BLE  Lymphadenopathy:    She has no cervical adenopathy.  Neurological: She is alert and oriented to person, place, and time. She exhibits normal muscle tone. Coordination normal.  Skin: Skin is warm and dry.  Several abrasions to BLE's. No red streaking or surrounding erythema  Nursing note and vitals reviewed.    ED Treatments / Results  Labs (all labs ordered are listed, but only abnormal results are displayed) Labs Reviewed  BASIC METABOLIC PANEL - Abnormal; Notable for the following:       Result Value   Chloride 100 (*)    CO2 35 (*)    BUN 35 (*)    All other components within normal limits  BRAIN NATRIURETIC PEPTIDE - Abnormal; Notable for the following:    B Natriuretic Peptide 876.0 (*)    All other components within normal limits  CBC WITH DIFFERENTIAL/PLATELET - Abnormal; Notable for the following:    WBC 20.6 (*)    RBC 3.28 (*)     Hemoglobin 10.2 (*)  HCT 32.6 (*)    RDW 17.9 (*)    Neutro Abs 18.1 (*)    Monocytes Absolute 1.6 (*)    All other components within normal limits  I-STAT TROPOININ, ED    EKG  EKG Interpretation None       Radiology Dg Chest Portable 1 View  Result Date: 06/08/2016 CLINICAL DATA:  Shortness of breath.  COPD EXAM: PORTABLE CHEST 1 VIEW COMPARISON:  06/03/2016 FINDINGS: Right chest wall port a catheter is identified with tip in the projection of the cavoatrial junction. Aortic atherosclerosis. Normal heart size. No pleural effusion or edema. Lungs appear hyperinflated with diffuse coarsened interstitial markings compatible with the clinical history of emphysema. IMPRESSION: 1. No acute cardiopulmonary abnormalities. 2. Aortic Atherosclerosis (ICD10-I70.0) and Emphysema (ICD10-J43.9). Electronically Signed   By: Kerby Moors M.D.   On: 06/08/2016 12:03    Procedures Procedures (including critical care time)  Medications Ordered in ED Medications  diltiazem (CARDIZEM CD) 24 hr capsule 240 mg (240 mg Oral Given 06/08/16 1351)  albuterol (PROVENTIL) (2.5 MG/3ML) 0.083% nebulizer solution 5 mg (not administered)  albuterol (PROVENTIL) (2.5 MG/3ML) 0.083% nebulizer solution 5 mg (5 mg Nebulization Given 06/08/16 1138)  HYDROcodone-acetaminophen (NORCO/VICODIN) 5-325 MG per tablet 1 tablet (1 tablet Oral Given 06/08/16 1509)     Initial Impression / Assessment and Plan / ED Course  I have reviewed the triage vital signs and the nursing notes.  Pertinent labs & imaging results that were available during my care of the patient were reviewed by me and considered in my medical decision making (see chart for details).     abrasions of the BLE's were cleaned and bandaged by nursing staff.   Pt with chronic COPD and continues to smoke.  D/c from hospital 2 days ago. Currently taking steroid and Levaquin.  BNP from 06/03/16 was 907, lactic acid was wnml.  exacerbation of sx's today possibly  related to medication noncompliance.    Will repeat labs, CXR and albuterol neb.  Pt received steroid and albuterol by EMS.  Discussed possible re-admit.  Tachycardic and tachypneic on arrival.    HR improved somewhat with diltiazem.    Ammon [facility]in [facility] who agrees to eval pt and admit  Final Clinical Impressions(s) / ED Diagnoses   Final diagnoses:  COPD exacerbation [facility])    New Prescriptions New Prescriptions   No medications on file     Kem Parkinson, Hershal Coria 06/08/16 1715    Noemi Chapel, MD 06/09/16 [phone]

## 2016-06-08 NOTE — Progress Notes (Signed)
Patient refused to take meds scheduled for me to give prior to shift end.  I asked Heather, RN if she would give her the abx with night meds. She stated she would.

## 2016-06-08 NOTE — ED Notes (Signed)
Pt called out stating she needs another breathing tx.  Primary nurse made aware.  Sats 100%.

## 2016-06-08 NOTE — ED Notes (Signed)
Threw patient's clothes away per her request as they were saturated with urine and feces.  She states she has no choice but to do this because she has no help at home.  Pt had a t shirt saturated with urine and feces stuffed between her legs.

## 2016-06-08 NOTE — H&P (Signed)
History and Physical    Lindsay Salazar YDX:[id number] DOB: December 08, 1950 DOA: 06/08/2016  PCP: [name], PA-C Consultants:  Talbert Cage - oncology; Branch - cardiology; Luna Glasgow - orthopedics Patient coming from: home - lives with boyfriend; NOK: boyfriend and daughter, [phone]  Chief Complaint: SOB  HPI: Lindsay Salazar is a 66 y.o. female with medical history significant of chronic respiratory failure on 2 L oxygen, COPD, chronic atrial fibrillation, pancreatic cancer , with recent hospital stay, just discharged yesterday for images cellulitis, and COPD Hospitalized, discharged 5/4.  Had COPD-exacerbation and PNA.  Got outside on the beautiful day yesterday and she developed worsening SOB.  Wheezing, but ongoing cough.  Still smoking but less than prior.  She feels like she needs to come back into the hospital.  Complains of decreased strength in her legs.  Stands up too fast and gets swimmy-headed.  This morning she was confused - wasn't exactly confused, just lack of oxygen to her brain.  Wears 2L O2 daily, maybe used 2 hours for the whole day.  Maybe 8-9 hours at night.  Thinks she is taking too much Lasix - "that's all I do is pee and poop on myself."  Still getting chemo for her cancer.  Thought she was going to have surgery several months ago, but she can't afford the gas to go see the surgeon.  Recent ER visits/hospitalizations: 5/1-4 - Acute on chronic respiratory failure with hypoxia from COPD exacerbation and HCAP 4/23-30 - left foot cellulitis and acute COPD exacerbation 4/5-9 - Acute on chronic respiratory failure due to COPD exacerbation 3/30 - RLE cellulitis recheck 3/24-29 - RLE cellulitis 2/23-3/2 - Acute on chronic respiratory failure due to COPD exacerbation and HCAP 2/19 - RLE DVT 1/26-28 - Acute on chronic respiratory failure due to COPD exacerbation 1/20 - chest pain 1/14-16 - Rectal bleeding  ED Course:  COPD with mild exacerbation and ongoing tobacco use; gave nebs and  steroids as well as PO Diltiazem for afib  Review of Systems: As per HPI; otherwise review of systems reviewed and negative.   Ambulatory Status:  Ambulates without assistance but thinks she needs a cane  Past Medical History:  Diagnosis Date  . Anemia of chronic disease   . Arthritis    bursitis , fibromyalgia  . Arthritis    "right hip" (11/27/2015)  . Asthma   . Chronic bronchitis ([place])   . Chronic respiratory failure ([place])   . Chronic right hip pain   . COPD (chronic obstructive pulmonary disease) ([place])    wears home o2 prn  . Daily headache    "last 2-3 months" (11/27/2015)  . DVT (deep venous thrombosis) ([place]) 03/2016  . Exposure to TB    "mom had it when she was pregnant with me"  . Fibromyalgia   . GERD (gastroesophageal reflux disease)    use to have it but not now  . Heart murmur    47-  34 years old  . Hyperkalemia   . Hypertension   . On home oxygen therapy    11/27/2015 "whenever I need it; don't know how many liters"  . PAF (paroxysmal atrial fibrillation) ([place])   . Pancreatic cancer ([place])   . Paroxysmal atrial flutter ([place])   . Pneumonia    "several times" (11/27/2015)  . Pneumothorax     Past Surgical History:  Procedure Laterality Date  . ANTERIOR CERVICAL DECOMP/DISCECTOMY FUSION     "put 4 screws in"  . BACK SURGERY    .  CARDIAC CATHETERIZATION  08/2004   Archie Endo 06/18/2010  . CARDIOVERSION N/A 07/18/2015   Procedure: CARDIOVERSION;  Surgeon: Josue Hector, MD;  Location[facility];  Service: Cardiovascular;  Laterality: N/A;  . ESOPHAGOGASTRODUODENOSCOPY N/A 08/09/2015   Procedure: ESOPHAGOGASTRODUODENOSCOPY (EGD);  Surgeon: Rogene Houston, MD;  Location: [facility];  Service: Endoscopy;  Laterality: N/A;  . EUS N/A 11/29/2015   Procedure: UPPER ENDOSCOPIC ULTRASOUND (EUS) RADIAL;  Surgeon: Milus Banister, MD;  Location: [facility];  Service: Endoscopy;  Laterality: N/A;  . IR GENERIC HISTORICAL  12/18/2015   IR US GUIDE VASC ACCESS  RIGHT 12/18/2015 Sandi Mariscal, MD [facility]  . IR GENERIC HISTORICAL  12/18/2015   IR FLUORO GUIDE PORT INSERTION RIGHT 12/18/2015 Sandi Mariscal, MD [facility]  . TONSILLECTOMY    . TUBAL LIGATION      Social History   Social History  . Marital status: Divorced    Spouse name: N/A  . Number of children: N/A  . Years of education: N/A   Occupational History  . retired    Social History Main Topics  . Smoking status: Former Smoker    Packs/day: 0.50    Years: 46.00    Types: Cigarettes    Start date: 10/09/1969    Quit date: 11/29/2015  . Smokeless tobacco: Never Used  . Alcohol use No     Comment: quit 30 years ago  . Drug use: No  . Sexual activity: Not Currently    Birth control/ protection: Surgical, Post-menopausal   Other Topics Concern  . Not on file   Social History Narrative  . No narrative on file    Allergies  Allergen Reactions  . Ibuprofen Nausea And Vomiting  . Other     Seafood   . Penicillins Swelling    Has patient had a PCN reaction causing immediate rash, facial/tongue/throat swelling, SOB or lightheadedness with hypotension: Yes Has patient had a PCN reaction causing severe rash involving mucus membranes or skin necrosis: No Has patient had a PCN reaction that required hospitalization No Has patient had a PCN reaction occurring within the last 10 years: No If all of the above answers are "NO", then may proceed with Cephalosporin use.   . Tiotropium Bromide Monohydrate Itching  . Tramadol Nausea And Vomiting    Family History  Problem Relation Age of Onset  . COPD Mother   . Diabetes Father   . Stroke Father     Prior to Admission medications   Medication Sig Start Date End Date Taking? Authorizing Provider  COMBIVENT RESPIMAT 20-100 MCG/ACT AERS respimat Inhale 1 puff into the lungs 2 (two) times daily. 12/03/15  Yes Rai, Ripudeep K, MD  diltiazem (CARDIZEM CD) 240 MG 24 hr capsule Take 1 capsule (240 mg total) by mouth daily.  05/23/16 08/21/16 Yes Lendon Colonel, NP  furosemide (LASIX) 40 MG tablet Take 1 tablet (40 mg total) by mouth daily. 06/06/16  Yes Kathie Dike, MD  guaiFENesin (MUCINEX) 600 MG 12 hr tablet Take 2 tablets (1,200 mg total) by mouth 2 (two) times daily. 06/02/16  Yes Sheikh, Omair Latif, DO  HYDROcodone-acetaminophen (NORCO) 10-325 MG tablet Take 1 tablet by mouth every 6 (six) hours as needed. 06/06/16  Yes Holley Bouche, NP  ipratropium-albuterol (DUONEB) 0.5-2.5 (3) MG/3ML SOLN Inhale 3 mLs into the lungs every 6 (six) hours as needed (for shortness of breath).  03/03/16  Yes [provider]  levofloxacin (LEVAQUIN) 750 MG tablet Take 1 tablet (  750 mg total) by mouth daily. 06/06/16  Yes Kathie Dike, MD  menthol-cetylpyridinium (CEPACOL) 3 MG lozenge Take 1 lozenge (3 mg total) by mouth as needed for sore throat. 06/02/16  Yes Sheikh, Omair Latif, DO  metoprolol tartrate (LOPRESSOR) 50 MG tablet Take 1 tablet (50 mg total) by mouth 2 (two) times daily. 03/02/16  Yes Kathie Dike, MD  ondansetron (ZOFRAN) 8 MG tablet Take 1 tablet by mouth daily as needed for nausea/vomiting. 12/10/15  Yes [provider]  OXYGEN Inhale 2 L into the lungs daily as needed (for breathing).   Yes [provider]  PACLitaxel Protein-Bound Part (ABRAXANE IV) Inject into the vein. Day 1, day 8, day 15, every 28 days   Yes [provider]  pantoprazole (PROTONIX) 40 MG tablet Take 1 tablet (40 mg total) by mouth daily. 12/03/15  Yes Rai, Ripudeep K, MD  potassium chloride (K-DUR) 10 MEQ tablet Take 1 tablet (10 mEq total) by mouth daily. 06/06/16  Yes Kathie Dike, MD  predniSONE (STERAPRED UNI-PAK 21 TAB) 10 MG (21) TBPK tablet Take 6 Tablets Day 1; 5 Tablets Day 2, 4 Tablets Day 3, 3 Tablets Day 4, 2 Tablets Day 5, 1 Tablet Day 6, and Stop on Day 7 06/06/16  Yes Kathie Dike, MD  prochlorperazine (COMPAZINE) 10 MG tablet Take 1 tablet by mouth daily as needed for  nausea/vomiting. 12/10/15  Yes [provider]  Rivaroxaban (XARELTO) 15 MG TABS tablet Take 1 tablet (15 mg total) by mouth daily with supper. 05/01/16  Yes Tat, Shanon Brow, MD  SYMBICORT 160-4.5 MCG/ACT inhaler Inhale 1 puff into the lungs 2 (two) times daily. 12/03/15  Yes Rai, Vernelle Emerald, MD    Physical Exam: Vitals:   06/08/16 1500 06/08/16 1532 06/08/16 1630 06/08/16 1700  BP: (!) 142/77  (!) 133/95 (!) 153/49  Pulse: (!) 138  (!) 103 (!) 102  Resp: 18  (!) 22 20  Temp:    98.2 F (36.8 C)  TempSrc:    Oral  SpO2: 100% 100% 96% 100%  Weight:    60.9 kg (134 lb 4.8 oz)  Height:    5\' 4"  (1.626 m)     General:  Appears calm and comfortable and is NAD; she appears to have aged markedly since I last admitted her and she appears much older than her stated age.  She was also with poor hygiene including apparent areas of dried stool on her legs and feet Eyes: PERRL, EOMI, normal lids, iris ENT:  grossly normal hearing, lips & tongue, mmm Neck:  no LAD, masses or thyromegaly Cardiovascular:  Irregularly irregular, mild tachycardia, no m/r/g. No LE edema.  Respiratory:  Scattered wheezes. Normal respiratory effort. Abdomen:  soft, ntnd, NABS Skin:  no rash or induration seen on limited exam Musculoskeletal:  grossly normal tone BUE/BLE, good ROM, no bony abnormality Psychiatric:  grossly normal mood and affect, speech fluent and appropriate, AOx3 Neuro:  2-12 grossly intact, moves all extremities in coordinated fashion, sensation intact  Labs on Admission: I have personally reviewed following labs and imaging studies  CBC:  Recent Labs Lab 06/02/16 0418 06/03/16 0901 06/04/16 0609 06/08/16 1140  WBC 9.1 12.4* 7.3 20.6*  NEUTROABS 7.1 10.2*  --  18.1*  HGB 10.0* 10.5* 9.3* 10.2*  HCT 31.8* 33.4* 29.6* 32.6*  MCV 98.5 99.1 98.3 99.4  PLT 373 350 318 175   Basic Metabolic Panel:  Recent Labs Lab 06/02/16 0418 06/03/16 0901 06/04/16 1025 06/05/16 0603 06/06/16 0432  06/08/16  1140  NA 136 135 134* 132* 134* 143  K 4.8 4.7 4.7 4.4 4.1 3.8  CL 101 99* 98* 95* 97* 100*  CO2 27 28 27 28 28  35*  GLUCOSE 130* 126* 225* 214* 227* 98  BUN 41* 36* 38* 38* 37* 35*  CREATININE 1.32* 1.05* 1.06* 1.03* 0.99 0.86  CALCIUM 8.1* 8.4* 8.3* 8.5* 8.4* 8.9  MG 1.8  --   --   --   --   --   PHOS 4.3  --   --   --   --   --    GFR: Estimated Creatinine Clearance: 54.1 mL/min (by C-G formula based on SCr of 0.86 mg/dL). Liver Function Tests:  Recent Labs Lab 06/02/16 0418 06/03/16 0901  AST 19 24  ALT 22 23  ALKPHOS 82 86  BILITOT 0.4 0.7  PROT 5.4* 5.7*  ALBUMIN 2.9* 3.1*   No results for input(s): LIPASE, AMYLASE in the last 168 hours. No results for input(s): AMMONIA in the last 168 hours. Coagulation Profile: No results for input(s): INR, PROTIME in the last 168 hours. Cardiac Enzymes:  Recent Labs Lab 06/03/16 0901  TROPONINI <0.03   BNP (last 3 results) No results for input(s): PROBNP in the last 8760 hours. HbA1C: No results for input(s): HGBA1C in the last 72 hours. CBG: No results for input(s): GLUCAP in the last 168 hours. Lipid Profile: No results for input(s): CHOL, HDL, LDLCALC, TRIG, CHOLHDL, LDLDIRECT in the last 72 hours. Thyroid Function Tests: No results for input(s): TSH, T4TOTAL, FREET4, T3FREE, THYROIDAB in the last 72 hours. Anemia Panel: No results for input(s): VITAMINB12, FOLATE, FERRITIN, TIBC, IRON, RETICCTPCT in the last 72 hours. Urine analysis:    Component Value Date/Time   COLORURINE YELLOW 06/03/2016 2100   APPEARANCEUR CLEAR 06/03/2016 2100   LABSPEC 1.025 06/03/2016 2100   PHURINE 5.0 06/03/2016 2100   GLUCOSEU 50 (A) 06/03/2016 2100   HGBUR NEGATIVE 06/03/2016 2100   [place] NEGATIVE 06/03/2016 2100   [place] NEGATIVE 06/03/2016 2100   PROTEINUR NEGATIVE 06/03/2016 2100   NITRITE NEGATIVE 06/03/2016 2100   LEUKOCYTESUR NEGATIVE 06/03/2016 2100    Creatinine Clearance: Estimated Creatinine  Clearance: 54.1 mL/min (by C-G formula based on SCr of 0.86 mg/dL).  Sepsis Labs: @LABRCNTIP (procalcitonin:4,lacticidven:4) ) Recent Results (from the past 240 hour(s))  Culture, blood (routine x 2) Call MD if unable to obtain prior to antibiotics being given     Status: None (Preliminary result)   Collection Time: 06/03/16  1:18 PM  Result Value Ref Range Status   Specimen Description BLOOD RIGHT HAND  Final   Special Requests   Final    BOTTLES DRAWN AEROBIC AND ANAEROBIC Blood Culture adequate volume   Culture NO GROWTH 4 DAYS  Final   Report Status PENDING  Incomplete  Culture, blood (routine x 2) Call MD if unable to obtain prior to antibiotics being given     Status: None (Preliminary result)   Collection Time: 06/03/16  1:18 PM  Result Value Ref Range Status   Specimen Description BLOOD RIGHT HAND  Final   Special Requests   Final    BOTTLES DRAWN AEROBIC ONLY Blood Culture results may not be optimal due to an inadequate volume of blood received in culture bottles   Culture NO GROWTH 4 DAYS  Final   Report Status PENDING  Incomplete     Radiological Exams on Admission: Dg Chest Portable 1 View  Result Date: 06/08/2016 CLINICAL DATA:  Shortness of breath.  COPD  EXAM: PORTABLE CHEST 1 VIEW COMPARISON:  06/03/2016 FINDINGS: Right chest wall port a catheter is identified with tip in the projection of the cavoatrial junction. Aortic atherosclerosis. Normal heart size. No pleural effusion or edema. Lungs appear hyperinflated with diffuse coarsened interstitial markings compatible with the clinical history of emphysema. IMPRESSION: 1. No acute cardiopulmonary abnormalities. 2. Aortic Atherosclerosis (ICD10-I70.0) and Emphysema (ICD10-J43.9). Electronically Signed   By: Kerby Moors M.D.   On: 06/08/2016 12:03    EKG: Independently reviewed.  Afib with rate 126; nonspecific ST changes that are probably rate-related  Assessment/Plan Principal Problem:   Acute on chronic respiratory  failure with hypoxia [facility]) Active Problems:   Atrial fibrillation (HCC)   Pancreatic cancer (HCC)   Anemia of chronic disease   COPD exacerbation ([facility])   Acute on chronic respiratory failure related to COPD exacerbation -Patient recently discharged (5/4) for COPD exac with HCAP who presents with recurrent symptoms -Significant wheezing, SOB on presentation -This is her 6th hospitalization for this since January -She remains on nebs/home O2/steroids/Levaquin from the last hospitalization but there is a question of compliance -There is also a question of how much support and care she is actually receiving at home -Would seriously consider SNF rehab if patient qualifies -She may also benefit from admission into the [facility] if she qualifies, to try to decrease her ER and hospital utilization -From a COPD perspective, her exacerbation does seem mild at this time -WBC 20.6, but this is influenced to at least some extent by demargination from steroids -will admit patient to telemetry bed  -Nebulizers: scheduled Duoneb and prn albuterol -Solu-Medrol 60 mg IV BID  -Continue PO Levaquin through 5/6.  -Continue home O2 at 3L; she reports intermittent use and would likely benefit from continuous use at home. -Continue Dulera 2 Puff BID  A Fib with RVR -Monitor on telemetry -Inadequately rate controlled on Cardizem 240 mg po Daily and Metoprolol 50 mg po BID.  -Will change Cardizem to 120 BID for now (so she will get an extra dose today) and add IV metoprolol prn -Continue anticoagulation with Xarelto 15 mg po daily -BNP 876.0, stable  Anemia of chronic disease -Hgb 10.2, stable -Continue to follow  Pancreatic Cancer -CA 19-9 was 194 on diagnosis in 10/17; it hit a nadir of 33 in 2/18 but is back to 90 as of 4/12 -Undergoing Chemotherapy but no recent f/u since 3/22, it appears -Scheduled for possible surgery but last f/u was in February and she did not return 4 weeks later as  recommended due to financial considerations and does not have an upcoming appointment    DVT prophylaxis: Xarelto Code Status:  Full - confirmed with patient Family Communication: Spoke by telephone with patient's boyfriend; he asks that the day doctor call and speak with the patient's daughter tomorrow since she is very worried about her mother [phone]; [phone]) Disposition Plan: Recommended SNF rehab to the patient and she is willing to consider it Consults called: PT/OT/CM/SW Admission status: Admit - It is my clinical opinion that admission to INPATIENT is reasonable and necessary because this patient will require at least 2 midnights in the hospital to treat this condition based on the medical complexity of the problems presented.  Given the aforementioned information, the predictability of an adverse outcome is felt to be significant.    Karmen Bongo MD [facility]  If 7PM-7AM, please contact night-coverage [email or web address] Password [email or web address]  06/08/2016, 5:54 PM

## 2016-06-09 LAB — CULTURE, BLOOD (ROUTINE X 2)
CULTURE: NO GROWTH
Culture: NO GROWTH
SPECIAL REQUESTS: ADEQUATE

## 2016-06-09 LAB — CBC
HCT: 27.6 % — ABNORMAL LOW (ref 36.0–46.0)
Hemoglobin: 8.4 g/dL — ABNORMAL LOW (ref 12.0–15.0)
MCH: 30.7 pg (ref 26.0–34.0)
MCHC: 30.4 g/dL (ref 30.0–36.0)
MCV: 100.7 fL — ABNORMAL HIGH (ref 78.0–100.0)
Platelets: 125 10*3/uL — ABNORMAL LOW (ref 150–400)
RBC: 2.74 MIL/uL — ABNORMAL LOW (ref 3.87–5.11)
RDW: 17.5 % — AB (ref 11.5–15.5)
WBC: 9.8 10*3/uL (ref 4.0–10.5)

## 2016-06-09 LAB — BASIC METABOLIC PANEL
Anion gap: 7 (ref 5–15)
BUN: 34 mg/dL — AB (ref 6–20)
CALCIUM: 8.4 mg/dL — AB (ref 8.9–10.3)
CO2: 34 mmol/L — ABNORMAL HIGH (ref 22–32)
Chloride: 98 mmol/L — ABNORMAL LOW (ref 101–111)
Creatinine, Ser: 0.93 mg/dL (ref 0.44–1.00)
GFR calc Af Amer: >60 mL/min (ref >60)
GLUCOSE: 162 mg/dL — AB (ref 65–99)
Potassium: 4 mmol/L (ref 3.5–5.1)
Sodium: 139 mmol/L (ref 135–145)

## 2016-06-09 LAB — GLUCOSE, CAPILLARY
GLUCOSE-CAPILLARY: 187 mg/dL — AB (ref 65–99)
GLUCOSE-CAPILLARY: 174 mg/dL — AB (ref 65–99)
GLUCOSE-CAPILLARY: 193 mg/dL — AB (ref 65–99)
Glucose-Capillary: 147 mg/dL — ABNORMAL HIGH (ref 65–99)

## 2016-06-09 NOTE — Progress Notes (Signed)
Changed dressings to pt's BLE, pt tolerated well.

## 2016-06-09 NOTE — Progress Notes (Signed)
Initial Nutrition Assessment  DOCUMENTATION CODES:  Non-severe (moderate) malnutrition in context of chronic illness mild muscle and fat depletion     INTERVENTION:   Ensure Enlive po BID  Recommend obtain standing weight   NUTRITION DIAGNOSIS:   Moderate malnutrition related to Chronic COPD as evidenced by multiple admissions related to  respiratory failure and both muscle and fat loss   GOAL:  Pt to meet >/= 90% of their estimated nutrition needs      MONITOR: Po intake, labs and wt trends     REASON FOR ASSESSMENT:  Consult to assess status     ASSESSMENT: Lindsay Salazar  is a 66 y.o. female, With history of chronic respiratory failure on 2 L oxygen, COPD, chronic atrial fibrillation, pancreatic cancer , with recent hospital stay, just discharged yesterday for images cellulitis, and COPD, she was discharged on prednisone on clindamycin, she presents with complaints of dyspnea and cough.  Multiple admissions (10) in the past 6 months. The patient is now agreeable to go to rehab from hospital and plans are in process.   Despite her acute and chronic illnesses she is eating fairly well 50-75% of most meals thus far. At home she eats 2 meals daily on average and snacks between.  She is able to feed herself.   There has been some fluctuation in her weight which she says happens when she takes prednisone. She reports usual wt at 123# (55.9 kg). Her weight is up from recent assessment from 54.4 kg to 60.9 kg in 4 days.  Nutrition-Focused physical exam completed. Findings are mild fat loss (orbital and  upper arms)  and muscle depletions (acromion and scapula regions) and moderate BLE edema.    Recent Labs Lab 06/06/16 0432 06/08/16 1140 06/09/16 0437  NA 134* 143 139  K 4.1 3.8 4.0  CL 97* 100* 98*  CO2 28 35* 34*  BUN 37* 35* 34*  CREATININE 0.99 0.86 0.93  CALCIUM 8.4* 8.9 8.4*  GLUCOSE 227* 98 162*   Labs and medications reviewed  Diet Order:  Diet Carb Modified Fluid  consistency: Thin; Room service appropriate? Yes  Skin:   discoloration to forearms and hands- pt says from taking blood thinner. Skin tear to left knee and shin from falling out of wheelchair   Last BM:   5/1   Height:   Ht Readings from Last 1 Encounters:  06/08/16 5\' 4"  (1.626 m)    Weight:   Wt Readings from Last 1 Encounters:  06/08/16 134 lb 4.8 oz (60.9 kg)    Ideal Body Weight:   54.5 kg  BMI:  Body mass index is 23.05 kg/m.  Estimated Nutritional Needs: Based on 54.4 kg-   Kcal:   3532-9924  Protein:   80-85 gr  Fluid:   1.6-1.8 liters daily  EDUCATION NEEDS: encouraged continued good po intake especially lean sources of protien to help build/maintain muscle and to help her body heal.   Colman Cater MS,RD,CSG,LDN Office: [phone] Pager: [phone]

## 2016-06-09 NOTE — Progress Notes (Signed)
PROGRESS NOTE    Lindsay Salazar  VZC:[id number] DOB: [birth year] DOA: 06/08/2016 PCP: Vesta Mixer     Brief Narrative:  66 y/o woman admitted from home on 5/6, well known to [place] for frequent hospitalizations for COPD.    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 -continue nebs/steroids.  A Fib with RVR -Rate is improved on increased cardizem dose.  FTT/Deconditioning -PT recs SNF. -SW aware and looking into placement.   DVT prophylaxis: Xarelto Code Status: full code Family Communication: patient only Disposition Plan: hope for DC SNF in 24 hours  Consultants:   none  Procedures:   None  Antimicrobials:  Anti-infectives    Start     Dose/Rate Route Frequency Ordered Stop   06/08/16 2000  levofloxacin (LEVAQUIN) tablet 750 mg     750 mg Oral Daily 06/08/16 1901         Subjective: Still SOB, seems interested in placement  Objective: Vitals:   06/09/16 0717 06/09/16 1020 06/09/16 1200 06/09/16 1355  BP:   140/75   Pulse:   87   Resp:   19   Temp:   97.8 F (36.6 C)   TempSrc:   Oral   SpO2: 95% 97% 98% 94%  Weight:      Height:        Intake/Output Summary (Last 24 hours) at 06/09/16 1914 Last data filed at 06/09/16 1700  Gross per 24 hour  Intake             1320 ml  Output             1000 ml  Net              320 ml   Filed Weights   06/08/16 1029 06/08/16 1700  Weight: 54.4 kg (120 lb) 60.9 kg (134 lb 4.8 oz)    Examination:  General exam: Alert, awake, oriented x 3 Respiratory system: Exp wheezes Cardiovascular system:RRR. No murmurs, rubs, gallops. Gastrointestinal system: Abdomen is nondistended, soft and nontender. No organomegaly or masses  felt. Normal bowel sounds heard. Central nervous system: Alert and oriented. No focal neurological deficits. Extremities: No C/C/E, +pedal pulses Skin: No rashes, lesions or ulcers Psychiatry: Judgement and insight appear normal. Mood & affect appropriate.     Data Reviewed: I have personally reviewed following labs and imaging studies  CBC:  Recent Labs Lab 06/03/16 0901 06/04/16 0609 06/08/16 1140 06/09/16 0437  WBC 12.4* 7.3 20.6* 9.8  NEUTROABS 10.2*  --  18.1*  --   HGB 10.5* 9.3* 10.2* 8.4*  HCT 33.4* 29.6* 32.6* 27.6*  MCV 99.1 98.3 99.4 100.7*  PLT 350 318 165 128*   Basic Metabolic Panel:  Recent Labs Lab 06/04/16 0609 06/05/16 0603 06/06/16 0432 06/08/16 1140 06/09/16 0437  NA 134* 132* 134* 143 139  K 4.7 4.4 4.1 3.8 4.0  CL 98* 95* 97* 100* 98*  CO2 27 28 28  35* 34*  GLUCOSE 225* 214* 227* 98 162*  BUN 38* 38* 37* 35* 34*  CREATININE 1.06* 1.03* 0.99 0.86 0.93  CALCIUM 8.3* 8.5* 8.4* 8.9 8.4*   GFR: Estimated Creatinine Clearance: 50 mL/min (by C-G formula based on SCr of 0.93 mg/dL).  Liver Function Tests:  Recent Labs Lab 06/03/16 0901  AST 24  ALT 23  ALKPHOS 86  BILITOT 0.7  PROT 5.7*  ALBUMIN 3.1*   No results for input(s): LIPASE, AMYLASE in the last 168 hours. No results for input(s): AMMONIA in the last 168 hours. Coagulation Profile: No results for input(s): INR, PROTIME in the last 168 hours. Cardiac Enzymes:  Recent Labs Lab 06/03/16 0901  TROPONINI <0.03   BNP (last 3 results) No results for input(s): PROBNP in the last 8760 hours. HbA1C: No results for input(s): HGBA1C in the last 72 hours. CBG:  Recent Labs Lab 06/08/16 2010 06/09/16 0813 06/09/16 1143 06/09/16 1658  GLUCAP 175* 187* 174* 147*   Lipid Profile: No results for input(s): CHOL, HDL, LDLCALC, TRIG, CHOLHDL, LDLDIRECT in the last 72 hours. Thyroid Function Tests: No results for input(s): TSH, T4TOTAL, FREET4, T3FREE, THYROIDAB in the last 72  hours. Anemia Panel: No results for input(s): VITAMINB12, FOLATE, FERRITIN, TIBC, IRON, RETICCTPCT in the last 72 hours. Urine analysis:    Component Value Date/Time   COLORURINE YELLOW 06/03/2016 2100   APPEARANCEUR CLEAR 06/03/2016 2100   LABSPEC 1.025 06/03/2016 2100   PHURINE 5.0 06/03/2016 2100   GLUCOSEU 50 (A) 06/03/2016 2100   HGBUR NEGATIVE 06/03/2016 2100   [place] NEGATIVE 06/03/2016 2100   [place] NEGATIVE 06/03/2016 2100   PROTEINUR NEGATIVE 06/03/2016 2100   NITRITE NEGATIVE 06/03/2016 2100   LEUKOCYTESUR NEGATIVE 06/03/2016 2100   Sepsis Labs: @LABRCNTIP (procalcitonin:4,lacticidven:4)  ) Recent Results (from the past 240 hour(s))  Culture, blood (routine x 2) Call MD if unable to obtain prior to antibiotics being given     Status: None   Collection Time: 06/03/16  1:18 PM  Result Value Ref Range Status   Specimen Description BLOOD RIGHT HAND  Final   Special Requests   Final    BOTTLES DRAWN AEROBIC AND ANAEROBIC Blood Culture adequate volume   Culture NO GROWTH 6 DAYS  Final   Report Status 06/09/2016 FINAL  Final  Culture, blood (routine x 2) Call MD if unable to obtain prior to antibiotics being given     Status: None   Collection Time: 06/03/16  1:18 PM  Result Value Ref Range Status   Specimen Description BLOOD RIGHT HAND  Final   Special Requests   Final    BOTTLES DRAWN AEROBIC ONLY Blood Culture results may not be optimal due to an inadequate volume of blood received in culture bottles   Culture NO GROWTH 6 DAYS  Final   Report Status 06/09/2016 FINAL  Final         Radiology Studies: Dg Chest Portable 1 View  Result Date: 06/08/2016 CLINICAL DATA:  Shortness of breath.  COPD EXAM: PORTABLE CHEST 1 VIEW COMPARISON:  06/03/2016 FINDINGS: Right chest wall port a catheter is identified with tip in the projection of the cavoatrial junction. Aortic atherosclerosis. Normal heart size. No pleural effusion or edema. Lungs appear hyperinflated with  diffuse coarsened interstitial markings compatible with the clinical history of emphysema. IMPRESSION: 1. No acute cardiopulmonary abnormalities. 2. Aortic Atherosclerosis (ICD10-I70.0) and Emphysema (ICD10-J43.9). Electronically Signed   By: Kerby Moors M.D.   On: 06/08/2016 12:03        Scheduled Meds: . diltiazem  120 mg Oral Q12H  . docusate sodium  100 mg Oral BID  . feeding supplement (ENSURE ENLIVE)  237 mL Oral BID BM  . furosemide  40 mg Oral Daily  . guaiFENesin  1,200 mg Oral BID  .  insulin aspart  0-15 Units Subcutaneous TID WC  . insulin aspart  0-5 Units Subcutaneous QHS  . ipratropium-albuterol  3 mL Inhalation Q6H  . levofloxacin  750 mg Oral Daily  . methylPREDNISolone (SOLU-MEDROL) injection  60 mg Intravenous Q12H  . metoprolol  50 mg Oral BID  . mometasone-formoterol  2 puff Inhalation BID  . pantoprazole  40 mg Oral Daily  . potassium chloride  10 mEq Oral Daily  . Rivaroxaban  15 mg Oral Q supper  . sodium chloride flush  3 mL Intravenous Q12H   Continuous Infusions:   LOS: 1 day    Time spent: 25 minutes. Greater than 50% of this time was spent in direct contact with the patient coordinating care.     Lelon Frohlich, MD [facility] Pager [phone]  If 7PM-7AM, please contact night-coverage [email or web address] Password [email or web address] 06/09/2016, 7:14 PM

## 2016-06-09 NOTE — NC FL2 (Signed)
[place] LEVEL OF CARE SCREENING TOOL     IDENTIFICATION  Patient Name: Lindsay Salazar Birthdate: [birth year] Sex: female Admission Date (Current Location): 06/08/2016  [facility] and [place] Number:  [employer] and Address:  [place] [address], [place]      Provider Number: [id number]  Attending Physician Name and Address:  Isaac Bliss, [place]  Relative Name and Phone Number:       Current Level of Care: Hospital Recommended Level of Care: [place] Prior Approval Number:    Date Approved/Denied:   PASRR Number:   [id number]   Discharge Plan: SNF    Current Diagnoses: Patient Active Problem List   Diagnosis Date Noted  . Malnutrition of moderate degree 06/04/2016  . Cellulitis of left foot 05/26/2016  . COPD exacerbation ([place]) 05/09/2016  . Cellulitis 04/26/2016  . Chronic respiratory failure with hypoxia ([place]) 04/26/2016  . Hypoxemia 03/30/2016  . Acute on chronic respiratory failure with hypoxia ([place]) 03/29/2016  . Leukocytosis 03/29/2016  . Anemia of chronic disease 03/29/2016  . Essential hypertension 03/29/2016  . HCAP (healthcare-associated pneumonia) 03/28/2016  . B12 deficiency 01/24/2016  . COPD with acute exacerbation ([place]) 01/20/2016  . Pancreatic cancer ([place]) 12/07/2015  . Chronic anticoagulation-Xarelto 09/06/2015  . Gastrointestinal hemorrhage with melena 08/08/2015  . Atrial fibrillation ([place]) 07/17/2015    Orientation RESPIRATION BLADDER Height & Weight     Self, Time, Situation, Place  O2 (2L) Continent Weight: 134 lb 4.8 oz (60.9 kg) Height:  5\' 4"  (162.6 cm)  BEHAVIORAL SYMPTOMS/MOOD NEUROLOGICAL BOWEL NUTRITION STATUS      Continent Diet (See DC summary)  AMBULATORY STATUS COMMUNICATION OF NEEDS Skin   Extensive Assist Verbally Skin abrasions, Surgical wounds (Right Leg, PRN dressing)                       Personal Care Assistance Level of Assistance   Bathing, Feeding, Dressing Bathing Assistance: Limited assistance Feeding assistance: Limited assistance Dressing Assistance: Limited assistance     Functional Limitations Info  Sight, Hearing, Speech Sight Info: Adequate Hearing Info: Adequate Speech Info: Adequate    SPECIAL CARE FACTORS FREQUENCY  PT (By licensed PT), OT (By licensed OT)     PT Frequency: 5x OT Frequency: 5x            Contractures Contractures Info: Not present    Additional Factors Info  Code Status, Allergies Code Status Info: Full Code Allergies Info: Ibuprofen, Other, Penicillins, Tiotropium Bromide Monohydrate, Tramadol           Current Medications (06/09/2016):  This is the current hospital active medication list Current Facility-Administered Medications  Medication Dose Route Frequency Provider Last Rate Last Dose  . acetaminophen (TYLENOL) tablet 650 mg  650 mg Oral Q6H PRN Karmen Bongo, MD       Or  . acetaminophen (TYLENOL) suppository 650 mg  650 mg Rectal Q6H PRN Karmen Bongo, MD      . diltiazem (CARDIZEM) tablet 120 mg  120 mg Oral Lillia [place], MD   120 mg at 06/09/16 0824  . docusate sodium (COLACE) capsule 100 mg  100 mg Oral BID Karmen Bongo, MD   100 mg at 06/09/16 0825  . feeding supplement (ENSURE ENLIVE) (ENSURE ENLIVE) liquid 237 mL  237 mL Oral BID BM Karmen Bongo, MD   237 mL at 06/09/16 1000  . furosemide (LASIX) tablet 40 mg  40 mg Oral Daily Yates,  Anderson [place], MD   40 mg at 06/09/16 0825  . guaiFENesin (MUCINEX) 12 hr tablet 1,200 mg  1,200 mg Oral BID Karmen Bongo, MD   1,200 mg at 06/09/16 0825  . HYDROcodone-acetaminophen (NORCO) 10-325 MG per tablet 1 tablet  1 tablet Oral Q6H PRN Karmen Bongo, MD   1 tablet at 06/09/16 0824  . insulin aspart (novoLOG) injection 0-15 Units  0-15 Units Subcutaneous TID WC Karmen Bongo, MD   3 Units at 06/09/16 1204  . insulin aspart (novoLOG) injection 0-5 Units  0-5 Units Subcutaneous QHS Karmen Bongo, MD       . ipratropium-albuterol (DUONEB) 0.5-2.5 (3) MG/3ML nebulizer solution 3 mL  3 mL Inhalation Q6H Karmen Bongo, MD   3 mL at 06/09/16 1354  . levofloxacin (LEVAQUIN) tablet 750 mg  750 mg Oral Daily Karmen Bongo, MD   750 mg at 06/09/16 0824  . menthol-cetylpyridinium (CEPACOL) lozenge 3 mg  1 lozenge Oral PRN Karmen Bongo, MD      . methylPREDNISolone sodium succinate (SOLU-MEDROL) 125 mg/2 mL injection 60 mg  60 mg Intravenous Lillia Mountain, MD   60 mg at 06/09/16 0824  . metoprolol (LOPRESSOR) injection 5 mg  5 mg Intravenous Q6H PRN Karmen Bongo, MD      . metoprolol (LOPRESSOR) tablet 50 mg  50 mg Oral BID Karmen Bongo, MD   50 mg at 06/09/16 0825  . mometasone-formoterol (DULERA) 200-5 MCG/ACT inhaler 2 puff  2 puff Inhalation BID Karmen Bongo, MD      . ondansetron [facility]) tablet 4 mg  4 mg Oral Q6H PRN Karmen Bongo, MD       Or  . ondansetron [facility]) injection 4 mg  4 mg Intravenous Q6H PRN Karmen Bongo, MD      . pantoprazole (PROTONIX) EC tablet 40 mg  40 mg Oral Daily Karmen Bongo, MD   40 mg at 06/09/16 0824  . potassium chloride (K-DUR,KLOR-CON) CR tablet 10 mEq  10 mEq Oral Daily Karmen Bongo, MD   10 mEq at 06/09/16 0825  . prochlorperazine (COMPAZINE) tablet 10 mg  10 mg Oral Q6H PRN Karmen Bongo, MD      . Rivaroxaban [facility]) tablet 15 mg  15 mg Oral Q supper Karmen Bongo, MD      . sodium chloride flush (NS) 0.9 % injection 3 mL  3 mL Intravenous Q12H Karmen Bongo, MD   3 mL at 06/09/16 0825     Discharge Medications: Please see discharge summary for a list of discharge medications.  Relevant Imaging Results:  Relevant Lab Results:   Additional Information SSN: [id number]   Receiving chemotherapy at [facility], [place]

## 2016-06-09 NOTE — Clinical Social Work Note (Signed)
Clinical Social Work Assessment  Patient Details  Name: Lindsay Salazar MRN: [id number] Date of Birth: January 16, 1951  Date of referral:  06/09/16               Reason for consult:  Facility Placement, [employer], Discharge Planning                Permission sought to share information with:  Tourist information centre manager, Customer service manager, Family Supports Permission granted to share information::  Yes, Verbal Permission Granted  Name::        Agency::     Relationship::  Boyfriend Corene Cornea and Daughter  Contact Information:     Housing/Transportation Living arrangements for the past 2 months:  [place] of Information:  Patient, Medical Team, Case Manager Patient Interpreter Needed:  None Criminal Activity/Legal Involvement Pertinent to Current Situation/Hospitalization:  No - Comment as needed Significant Relationships:  Adult Children, Other Family Members Lives with:  Adult Children, Significant Other Do you feel safe going back to the place where you live?  No Need for family participation in patient care:  No (Coment)  Care giving concerns:  Patient continues to admit to the hospital right after being discharged with 10 admissions in the last six months.  LCSW met with patient to understand barriers of quick readmissions.  Patient reports "my nerves and my breathing, I got to get this under control".   Patient again given options of home and short term rehab.  Patient at this time is agreeable to SNF. Wanting placement in [place].  While in assessment, boyfriend called on phone and patient gave permission to update and explain SNF as he was angry she was going.  LCSW explained criteria and patient's need for higher level of care due to multiple readmissions.  Boyfriend agreeable, requesting [place] or [place] due to transportation.    Social Worker assessment / plan:  Assessment completed at bedside with patient. Discussed reason for intervention and recommendations.  Patient agreeable. SNF work up completed and insurance authorization has been started.  Will follow up with bed offers.  Employment status:  Disabled (Comment on whether or not currently receiving Disability) Insurance information:  Programmer, applications PT Recommendations:  [place] / Referral to community resources:  [place]  Patient/Family's Response to care:  All agreeable to placement  Patient/Family's Understanding of and Emotional Response to Diagnosis, Current Treatment, and Prognosis:  Patient able to recognize coming in and out of the hospital is not helpful to her treatment and agreeable for placement.  Emotional Assessment Appearance:  Appears stated age Attitude/Demeanor/Rapport:    Affect (typically observed):  Accepting, Anxious, Overwhelmed Orientation:  Oriented to Self, Oriented to Place, Oriented to  Time, Oriented to Situation Alcohol / Substance use:  Not Applicable Psych involvement (Current and /or in the community):  No (Comment)  Discharge Needs  Concerns to be addressed:  Compliance Issues Concerns Readmission within the last 30 days:  Yes Current discharge risk:  None Barriers to Discharge:  Ship broker, Continued Medical Work up   Lilly Cove, LCSW 06/09/2016, 2:03 PM

## 2016-06-09 NOTE — Clinical Social Work Placement (Addendum)
   CLINICAL SOCIAL WORK PLACEMENT  NOTE  Date:  06/09/2016  Patient Details  Name: Lindsay Salazar MRN: [id number] Date of Birth: April 10, 1950  Clinical Social Work is seeking post-discharge placement for this patient at the [place] level of care (*CSW will initial, date and re-position this form in  chart as items are completed):  Yes   Patient/family provided with [place] Work Department's list of facilities offering this level of care within the geographic area requested by the patient (or if unable, by the patient's family).  Yes   Patient/family informed of their freedom to choose among providers that offer the needed level of care, that participate in Medicare, Medicaid or managed care program needed by the patient, have an available bed and are willing to accept the patient.  Yes   Patient/family informed of [facility]'s ownership interest in [facility] and [facility], as well as of the fact that they are under no obligation to receive care at these facilities.  PASRR submitted to EDS on 06/09/16     PASRR number received on 06/09/16     Existing PASRR number confirmed on       FL2 transmitted to all facilities in geographic area requested by pt/family on 06/09/16     FL2 transmitted to all facilities within larger geographic area on       Patient informed that his/her managed care company has contracts with or will negotiate with certain facilities, including the following:            Patient/family informed of bed offers received.  06/10/2016   Patient chooses bed at       [facility]  Physician recommends and patient chooses bed at      [facility] Patient to be transferred to   on  .  06/10/2016   Patient to be transferred to facility by      EMS  Patient family notified on   of transfer.  Patient and boyfriend.  Name of family member notified:        PHYSICIAN Please sign FL2     Additional Comment:     _______________________________________________ Lilly Cove, LCSW 06/09/2016, 2:01 PM

## 2016-06-09 NOTE — Evaluation (Signed)
Physical Therapy Evaluation Patient Details Name: Lindsay Salazar MRN: [id number] DOB: 1950/12/10 Today's Date: 06/09/2016   History of Present Illness  Lindsay Salazar is a 66yo white person identifying as female who comes to [facility] 2D s/p DC from this facility with worsening SOB. The patient was recently DC d/t COPD exacerbation. PMH: Chronic respiratory failure, COPD, afib, and pancreatic CA. The patient is in chronic O2 at home. She has had 10 admissions in the past 6 months, with multiple recommendations for DC to STR/SNF. but has ultimately refused. She has also not been agreeable to HHPT services because of the condition of her home and large dogs that bite people. Baseline level of funciton is very limited, using furniture for support, as she reports using her rollator fatigues her arms excessively and creates mor eenergy expenditure. She currently requires assistance for ADL and IALD.   Clinical Impression  Pt admitted with above diagnosis. Pt currently with functional limitations due to the deficits listed below (see "PT Problem List"). Upon entry, the patient is received semirecumbent in bed, no family/caregiver present. The pt is awake and agreeable to participate. Pt in afib at rest with HR <100BPM, but with transfers and standing at EOB 125-145bpm. Pt with BLE edema, Rt>Lt. The pt is alert and oriented x3, pleasant, conversational, and following simple and multi-step commands consistently, however is somewhat anxious and limited due to fatigue and pain in LUE and big toe. Pt received on and remaining on 2L O2 throughout evaluation, with noted saturation of >96% at rest, unable to obtain activity or recoveyr reading due to poor circulation.Functional mobility assessment demonstrates moderate to severe weakness, the pt now requiring maximal effort to rise from an elevated surface, with heavy BUE support on walker. Pt will benefit from skilled PT intervention to increase independence and safety with basic  mobility in preparation for discharge to the venue listed below.       Follow Up Recommendations SNF;Supervision/Assistance - 24 hour (If patient refuses SNF, please set up for OPPT (HHPT is not appropriate at this time.))    Equipment Recommendations  None recommended by PT    Recommendations for Other Services       Precautions / Restrictions Precautions Precautions: Fall Precaution Comments: history of 2 "slides to the floor" in past 3 months.  Restrictions Weight Bearing Restrictions: No      Mobility  Bed Mobility Overal bed mobility: Modified Independent Bed Mobility: Supine to Sit;Sit to Supine     Supine to sit: Min guard Sit to supine: Min guard      Transfers Overall transfer level: Needs assistance Equipment used: None Transfers: Sit to/from Stand Sit to Stand: From elevated surface;Min guard         General transfer comment: dyskinetic with weakness in low back, TKE prior to trunk extension. Heavy use of BUE for trunk extension.   Ambulation/Gait   Ambulation Distance (Feet): 4 Feet Assistive device: Rolling walker (2 wheeled) Gait Pattern/deviations: Step-to pattern   Gait velocity interpretation: <1.8 ft/sec, indicative of risk for recurrent falls General Gait Details: very unsteady; self limited due to feeling weak; HR in afib from 125-145 uncontrolled.   Stairs            Wheelchair Mobility    Modified Rankin (Stroke Patients Only)       Balance Overall balance assessment: History of Falls;Needs assistance;No apparent balance deficits (not formally assessed)  Pertinent Vitals/Pain Pain Assessment: 0-10 Pain Score: 9  Pain Location: Right big toe; Left wrist; left elbow.  Pain Descriptors / Indicators: Hervey Ard;Aching Pain Intervention(s): Limited activity within patient's tolerance    Home Living Family/patient expects to be discharged to:: Private residence Living  Arrangements: Spouse/significant other Available Help at Discharge: Available 24 hours/day Type of Home: House Home Access: Stairs to enter   [employer] of Steps: 3 Home Layout: One level Home Equipment: Clinical cytogeneticist - 2 wheels;Bedside commode;Other (comment);Walker - 4 wheels;Wheelchair - manual      Prior Function Level of Independence: Needs assistance (some assistance for ADL; much assistance for IADL)   Gait / Transfers Assistance Needed: Pt states that she is a furniture walker to get around the house; rollator requires too much energy  ADL's / Homemaking Assistance Needed: Pt requires some assistance with dressing and she takes a shower at her boyfriends sisters house because her shower is not working.          Hand Dominance   Dominant Hand: Right    Extremity/Trunk Assessment   Upper Extremity Assessment Upper Extremity Assessment: Generalized weakness    Lower Extremity Assessment Lower Extremity Assessment: Generalized weakness       Communication   Communication: No difficulties  Cognition Arousal/Alertness: Awake/alert   Overall Cognitive Status: Within Functional Limits for tasks assessed                                        General Comments      Exercises     Assessment/Plan    PT Assessment Patient needs continued PT services  PT Problem List Decreased strength;Decreased activity tolerance;Decreased balance;Decreased mobility;Decreased safety awareness;Cardiopulmonary status limiting activity;Pain       PT Treatment Interventions DME instruction;Gait training;Functional mobility training;Therapeutic activities;Therapeutic exercise;Patient/family education;Balance training    PT Goals (Current goals can be found in the Care Plan section)  Acute Rehab PT Goals Patient Stated Goal: Reduce admissions level and improve overall health and self-care.  PT Goal Formulation: With patient Time For Goal Achievement:  06/23/16 Potential to Achieve Goals: Fair    Frequency Min 2X/week   Barriers to discharge Inaccessible home environment;Decreased caregiver support      Co-evaluation               AM-PAC PT "6 Clicks" Daily Activity  Outcome Measure Difficulty turning over in bed (including adjusting bedclothes, sheets and blankets)?: A Little Difficulty moving from lying on back to sitting on the side of the bed? : A Little Difficulty sitting down on and standing up from a chair with arms (e.g., wheelchair, bedside commode, etc,.)?: A Lot Help needed moving to and from a bed to chair (including a wheelchair)?: A Lot Help needed walking in hospital room?: Total Help needed climbing 3-5 steps with a railing? : Total 6 Click Score: 12    End of Session Equipment Utilized During Treatment: Gait belt;Oxygen Activity Tolerance: Patient limited by fatigue;Treatment limited secondary to medical complications (Comment) (HR uncontrolled in afib in 125-145bpm during standing. ) Patient left: in bed;with call bell/phone within reach;with bed alarm set Nurse Communication: Mobility status;Other (comment) (vitals) PT Visit Diagnosis: Muscle weakness (generalized) (M62.81);Other abnormalities of gait and mobility (R26.89)    Time: 0814-4818 PT Time Calculation (min) (ACUTE ONLY): 24 min   Charges:   PT Evaluation $PT Eval Moderate Complexity: 1 Procedure PT Treatments $Therapeutic Activity: 8-22  mins   PT G Codes:        10:47 AM, 2016-07-08 Etta Grandchild, PT, DPT Physical Therapist - [place] [id number] [facility])  [phone] (mobile)    Gunda Maqueda C Jul 08, 2016, 10:42 AM

## 2016-06-10 ENCOUNTER — Other Ambulatory Visit (HOSPITAL_COMMUNITY): Payer: Self-pay | Admitting: *Deleted

## 2016-06-10 LAB — GLUCOSE, CAPILLARY
GLUCOSE-CAPILLARY: 176 mg/dL — AB (ref 65–99)
GLUCOSE-CAPILLARY: 146 mg/dL — AB (ref 65–99)
Glucose-Capillary: 201 mg/dL — ABNORMAL HIGH (ref 65–99)
Glucose-Capillary: 165 mg/dL — ABNORMAL HIGH (ref 65–99)

## 2016-06-10 LAB — HEMOGLOBIN A1C
HEMOGLOBIN A1C: 5.7 % — AB (ref 4.8–5.6)
MEAN PLASMA GLUCOSE: 117 mg/dL

## 2016-06-10 MED ORDER — PREDNISONE 10 MG PO TABS: 10.0000 mg | ORAL_TABLET | Freq: Every day | ORAL | 0 refills | Status: DC

## 2016-06-10 MED ORDER — HYDROCODONE-ACETAMINOPHEN 10-325 MG PO TABS: 1.0000 | ORAL_TABLET | Freq: Four times a day (QID) | ORAL | 0 refills | Status: DC | PRN

## 2016-06-10 NOTE — Clinical Social Work Note (Signed)
LCSW spoke with Lindsay Salazar at [facility] to follow up on authorization. She advised that patient's auth needed to be faxed to [phone] not the number it was faxed to yesterday. She advised that they had a four hour turn around so placement should be authorized today.      Kenijah Benningfield, [name], LCSW

## 2016-06-10 NOTE — Discharge Summary (Signed)
Physician Discharge Summary  Karan Inclan [facility] IRC:[id number] DOB: 1950/06/20 DOA: 06/08/2016  PCP: [name], PA-C  Admit date: 06/08/2016 Discharge date: 06/10/2016  Time spent: 45 minutes  Recommendations for Outpatient Follow-up:  -Will be discharged to SNF today.   Discharge Diagnoses:  Principal Problem:   Acute on chronic respiratory failure with hypoxia [facility]) Active Problems:   Atrial fibrillation (HCC)   Pancreatic cancer (HCC)   Anemia of chronic disease   COPD exacerbation ([place])   Discharge Condition: Stable and improved  Filed Weights   06/08/16 1029 06/08/16 1700  Weight: 54.4 kg (120 lb) 60.9 kg (134 lb 4.8 oz)    History of present illness:  As per Dr. Lorin Mercy on 5/6: Lindsay Salazar is a 66 y.o. female with medical history significant of chronic respiratory failure on 2 L oxygen, COPD, chronic atrial fibrillation, pancreatic cancer , with recent hospital stay, just discharged yesterday for images cellulitis, and COPD Hospitalized, discharged 5/4.  Had COPD-exacerbation and PNA.  Got outside on the beautiful day yesterday and she developed worsening SOB.  Wheezing, but ongoing cough.  Still smoking but less than prior.  She feels like she needs to come back into the hospital.  Complains of decreased strength in her legs.  Stands up too fast and gets swimmy-headed.  This morning she was confused - wasn't exactly confused, just lack of oxygen to her brain.  Wears 2L O2 daily, maybe used 2 hours for the whole day.  Maybe 8-9 hours at night.  Thinks she is taking too much Lasix - "that's all I do is pee and poop on myself."  Still getting chemo for her cancer.  Thought she was going to have surgery several months ago, but she can't afford the gas to go see the surgeon.  Recent ER visits/hospitalizations: 5/1-4 - Acute on chronic respiratory failure with hypoxia from COPD exacerbation and HCAP 4/23-30 - left foot cellulitis and acute COPD exacerbation 4/5-9 - Acute on  chronic respiratory failure due to COPD exacerbation 3/30 - RLE cellulitis recheck 3/24-29 - RLE cellulitis 2/23-3/2 - Acute on chronic respiratory failure due to COPD exacerbation and HCAP 2/19 - RLE DVT 1/26-28 - Acute on chronic respiratory failure due to COPD exacerbation 1/20 - chest pain 1/14-16 - Rectal bleeding  ED Course:  COPD with mild exacerbation and ongoing tobacco use; gave nebs and steroids as well as PO Diltiazem for afib  Hospital Course:   Acute on chronic Hypoxic and Hypercapneic Respiratory Failure -Seems to be at baseline presently, which is anxious with mild wheezing. -continue nebs/prednisone taper.  A Fib with RVR -Rate is improved on cardizem. Worsened when given albuterol nebs.  FTT/Deconditioning -PT recs SNF. -SW aware and looking into placement.  Procedures:  None   Consultations:  None  Discharge Instructions  Discharge Instructions    Diet - low sodium heart healthy    Complete by:  As directed    Increase activity slowly    Complete by:  As directed      Allergies as of 06/10/2016      Reactions   Ibuprofen Nausea And Vomiting   Other    Seafood   Penicillins Swelling   Has patient had a PCN reaction causing immediate rash, facial/tongue/throat swelling, SOB or lightheadedness with hypotension: Yes Has patient had a PCN reaction causing severe rash involving mucus membranes or skin necrosis: No Has patient had a PCN reaction that required hospitalization No Has patient had a PCN reaction  occurring within the last 10 years: No If all of the above answers are "NO", then may proceed with Cephalosporin use.   Tiotropium Bromide Monohydrate Itching   Tramadol Nausea And Vomiting      Medication List    STOP taking these medications   levofloxacin 750 MG tablet Commonly known as:  LEVAQUIN   ondansetron 8 MG tablet Commonly known as:  ZOFRAN   predniSONE 10 MG (21) Tbpk tablet Commonly known as:  STERAPRED UNI-PAK 21  TAB Replaced by:  predniSONE 10 MG tablet     TAKE these medications   ABRAXANE IV Inject into the vein. Day 1, day 8, day 15, every 28 days   COMBIVENT RESPIMAT 20-100 MCG/ACT Aers respimat Generic drug:  Ipratropium-Albuterol Inhale 1 puff into the lungs 2 (two) times daily.   ipratropium-albuterol 0.5-2.5 (3) MG/3ML Soln Commonly known as:  DUONEB Inhale 3 mLs into the lungs every 6 (six) hours as needed (for shortness of breath).   diltiazem 240 MG 24 hr capsule Commonly known as:  CARDIZEM CD Take 1 capsule (240 mg total) by mouth daily.   furosemide 40 MG tablet Commonly known as:  LASIX Take 1 tablet (40 mg total) by mouth daily.   guaiFENesin 600 MG 12 hr tablet Commonly known as:  MUCINEX Take 2 tablets (1,200 mg total) by mouth 2 (two) times daily.   HYDROcodone-acetaminophen 10-325 MG tablet Commonly known as:  NORCO Take 1 tablet by mouth every 6 (six) hours as needed.   menthol-cetylpyridinium 3 MG lozenge Commonly known as:  CEPACOL Take 1 lozenge (3 mg total) by mouth as needed for sore throat.   metoprolol 50 MG tablet Commonly known as:  LOPRESSOR Take 1 tablet (50 mg total) by mouth 2 (two) times daily.   OXYGEN Inhale 2 L into the lungs daily as needed (for breathing).   pantoprazole 40 MG tablet Commonly known as:  PROTONIX Take 1 tablet (40 mg total) by mouth daily.   potassium chloride 10 MEQ tablet Commonly known as:  K-DUR Take 1 tablet (10 mEq total) by mouth daily.   predniSONE 10 MG tablet Commonly known as:  DELTASONE Take 1 tablet (10 mg total) by mouth daily with breakfast. Take 6 tablets today and then decrease by 1 tablet daily until none are left. Replaces:  predniSONE 10 MG (21) Tbpk tablet   prochlorperazine 10 MG tablet Commonly known as:  COMPAZINE Take 1 tablet by mouth daily as needed for nausea/vomiting.   Rivaroxaban 15 MG Tabs tablet Commonly known as:  XARELTO Take 1 tablet (15 mg total) by mouth daily with  supper.   SYMBICORT 160-4.5 MCG/ACT inhaler Generic drug:  budesonide-formoterol Inhale 1 puff into the lungs 2 (two) times daily.      Allergies  Allergen Reactions  . Ibuprofen Nausea And Vomiting  . Other     Seafood   . Penicillins Swelling    Has patient had a PCN reaction causing immediate rash, facial/tongue/throat swelling, SOB or lightheadedness with hypotension: Yes Has patient had a PCN reaction causing severe rash involving mucus membranes or skin necrosis: No Has patient had a PCN reaction that required hospitalization No Has patient had a PCN reaction occurring within the last 10 years: No If all of the above answers are "NO", then may proceed with Cephalosporin use.   . Tiotropium Bromide Monohydrate Itching  . Tramadol Nausea And Vomiting   Contact information for after-discharge care    [facility] Follow up.   Specialty:  [place] information: [address] [place] [phone]               The results of significant diagnostics from this hospitalization (including imaging, microbiology, ancillary and laboratory) are listed below for reference.    Significant Diagnostic Studies: Dg Chest 2 View  Result Date: 05/26/2016 CLINICAL DATA:  Cough, congestion. Bilateral lower leg swelling for 5 days. EXAM: CHEST  2 VIEW COMPARISON:  05/08/2016 FINDINGS: There is hyperinflation of the lungs compatible with COPD. Small left pleural effusion again noted, stable. Right Port-A-Cath is unchanged. Heart is normal size. No confluent opacities. IMPRESSION: COPD.  Small left pleural effusion, stable. Electronically Signed   By: Rolm Baptise M.D.   On: 05/26/2016 12:21   Dg Chest Portable 1 View  Result Date: 06/08/2016 CLINICAL DATA:  Shortness of breath.  COPD EXAM: PORTABLE CHEST 1 VIEW COMPARISON:  06/03/2016 FINDINGS: Right chest wall port a catheter is identified with tip in the  projection of the cavoatrial junction. Aortic atherosclerosis. Normal heart size. No pleural effusion or edema. Lungs appear hyperinflated with diffuse coarsened interstitial markings compatible with the clinical history of emphysema. IMPRESSION: 1. No acute cardiopulmonary abnormalities. 2. Aortic Atherosclerosis (ICD10-I70.0) and Emphysema (ICD10-J43.9). Electronically Signed   By: Kerby Moors M.D.   On: 06/08/2016 12:03   Dg Chest Portable 1 View  Result Date: 06/03/2016 CLINICAL DATA:  Shortness of breath. EXAM: PORTABLE CHEST 1 VIEW COMPARISON:  Radiographs of May 26, 2016. FINDINGS: Stable cardiomediastinal silhouette. Atherosclerosis of thoracic aorta is noted. Right internal jugular catheter is noted with distal tip in expected position of SVC. No pneumothorax is noted. Mild central pulmonary vascular congestion is noted. Increased left basilar opacity is noted concerning for worsening atelectasis or infiltrate with associated pleural effusion. Bony thorax is unremarkable. IMPRESSION: Increased left basilar opacity is noted concerning for worsening atelectasis or infiltrate with associated pleural effusion. Aortic atherosclerosis. Mild central pulmonary vascular congestion. Electronically Signed   By: Marijo Conception, M.D.   On: 06/03/2016 09:01    Microbiology: Recent Results (from the past 240 hour(s))  Culture, blood (routine x 2) Call MD if unable to obtain prior to antibiotics being given     Status: None   Collection Time: 06/03/16  1:18 PM  Result Value Ref Range Status   Specimen Description BLOOD RIGHT HAND  Final   Special Requests   Final    BOTTLES DRAWN AEROBIC AND ANAEROBIC Blood Culture adequate volume   Culture NO GROWTH 6 DAYS  Final   Report Status 06/09/2016 FINAL  Final  Culture, blood (routine x 2) Call MD if unable to obtain prior to antibiotics being given     Status: None   Collection Time: 06/03/16  1:18 PM  Result Value Ref Range Status   Specimen Description  BLOOD RIGHT HAND  Final   Special Requests   Final    BOTTLES DRAWN AEROBIC ONLY Blood Culture results may not be optimal due to an inadequate volume of blood received in culture bottles   Culture NO GROWTH 6 DAYS  Final   Report Status 06/09/2016 FINAL  Final     Labs: Basic Metabolic Panel:  Recent Labs Lab 06/04/16 0609 06/05/16 0603 06/06/16 0432 06/08/16 1140 06/09/16 0437  NA 134* 132* 134* 143 139  K 4.7 4.4 4.1 3.8 4.0  CL 98* 95* 97* 100* 98*  CO2 27 28 28  35* 34*  GLUCOSE  225* 214* 227* 98 162*  BUN 38* 38* 37* 35* 34*  CREATININE 1.06* 1.03* 0.99 0.86 0.93  CALCIUM 8.3* 8.5* 8.4* 8.9 8.4*   Liver Function Tests: No results for input(s): AST, ALT, ALKPHOS, BILITOT, PROT, ALBUMIN in the last 168 hours. No results for input(s): LIPASE, AMYLASE in the last 168 hours. No results for input(s): AMMONIA in the last 168 hours. CBC:  Recent Labs Lab 06/04/16 0609 06/08/16 1140 06/09/16 0437  WBC 7.3 20.6* 9.8  NEUTROABS  --  18.1*  --   HGB 9.3* 10.2* 8.4*  HCT 29.6* 32.6* 27.6*  MCV 98.3 99.4 100.7*  PLT 318 165 125*   Cardiac Enzymes: No results for input(s): CKTOTAL, CKMB, CKMBINDEX, TROPONINI in the last 168 hours. BNP: BNP (last 3 results)  Recent Labs  05/08/16 1645 06/03/16 0909 06/08/16 1140  BNP 711.0* 907.0* 876.0*    ProBNP (last 3 results) No results for input(s): PROBNP in the last 8760 hours.  CBG:  Recent Labs Lab 06/09/16 1658 06/09/16 2114 06/10/16 0310 06/10/16 0744 06/10/16 1124  GLUCAP 147* 193* 201* 176* 165*       Signed:  HERNANDEZ ACOSTA,ESTELA  [facility] Pager: [phone] 06/10/2016, 3:21 PM

## 2016-06-10 NOTE — Care Management Important Message (Signed)
Important Message  Patient Details  Name: Lindsay Salazar MRN: [id number] Date of Birth: 1950-06-28   Medicare Important Message Given:  Yes    Sherald Barge, RN 06/10/2016, 1:28 PM

## 2016-06-10 NOTE — Progress Notes (Signed)
LCSW following for disposition: New SNF  Per insurance, authorization as built wrong in their system and not sure if authorization will be achieved today.  Call placed to AD who reports he will authorize a 5 day LOG. Facility agreeable to plan. LCSW completed LOG paperwork and sent to AD. DC summary and chart sent to facility.  No other barriers.  Call RN to DC patient. Patient will go by EMS. DC to [facility].  Lane Hacker, MSW Clinical Social Work: [employer] Coverage for :  [phone]

## 2016-06-11 ENCOUNTER — Ambulatory Visit (HOSPITAL_COMMUNITY): Payer: Medicare HMO

## 2016-06-11 ENCOUNTER — Telehealth (HOSPITAL_COMMUNITY): Payer: Self-pay

## 2016-06-11 NOTE — Telephone Encounter (Signed)
See telephone encounter note.

## 2016-06-18 ENCOUNTER — Ambulatory Visit (HOSPITAL_COMMUNITY): Payer: Medicare HMO

## 2016-06-25 ENCOUNTER — Ambulatory Visit (HOSPITAL_COMMUNITY): Payer: Medicare HMO

## 2016-07-03 ENCOUNTER — Other Ambulatory Visit (HOSPITAL_COMMUNITY): Payer: Self-pay | Admitting: Oncology

## 2016-07-07 ENCOUNTER — Ambulatory Visit (HOSPITAL_COMMUNITY): Payer: Medicare HMO

## 2016-07-08 ENCOUNTER — Telehealth (HOSPITAL_COMMUNITY): Payer: Self-pay | Admitting: *Deleted

## 2016-07-08 NOTE — Telephone Encounter (Signed)
[facility] called for orders for PT and SN for patient. Patients PCP would not give orders because she hasn't been seen in over a year at PCP office. HHN stated we were her last option. Reviewed with PA-C. Gave HHN verbal order for SN and PT.    HHN also stated what we already knew about patients living conditions. The patient and her roommates are hoarders. The [facility] states there is just a path to walk through. The smell is atrocious. They have several dogs (2 were seen by [facility] and at least 1 more was heard). HHN states there is dog feces and urine throughout the house. They have carpet that is saturated with foul odors. HHN felt she needed a hazmat suit and did throw away her shoes after the visit.     HHN and her coworkers and superiors came to the same conclusion that APS told us the first time patient came here. Since patient is in her right mind and is choosing to live the way she does, there is nothing that can be done from APS perspective.

## 2016-07-16 ENCOUNTER — Encounter (HOSPITAL_COMMUNITY): Payer: Self-pay | Admitting: Oncology

## 2016-07-16 ENCOUNTER — Encounter (HOSPITAL_COMMUNITY): Payer: Medicare HMO

## 2016-07-16 ENCOUNTER — Encounter (HOSPITAL_COMMUNITY): Payer: Medicare HMO | Attending: Hematology & Oncology | Admitting: Oncology

## 2016-07-16 VITALS — BP 110/90 | HR 116 | Temp 98.4°F | Resp 18

## 2016-07-16 DIAGNOSIS — C251 Malignant neoplasm of body of pancreas: Secondary | ICD-10-CM | POA: Insufficient documentation

## 2016-07-16 DIAGNOSIS — E876 Hypokalemia: Secondary | ICD-10-CM | POA: Diagnosis not present

## 2016-07-16 DIAGNOSIS — C257 Malignant neoplasm of other parts of pancreas: Secondary | ICD-10-CM

## 2016-07-16 LAB — CBC WITH DIFFERENTIAL/PLATELET
BASOS ABS: 0 10*3/uL (ref 0.0–0.1)
BASOS PCT: 0 %
EOS ABS: 0.1 10*3/uL (ref 0.0–0.7)
EOS PCT: 1 %
HCT: 38.4 % (ref 36.0–46.0)
HEMOGLOBIN: 11.8 g/dL — AB (ref 12.0–15.0)
LYMPHS ABS: 3 10*3/uL (ref 0.7–4.0)
Lymphocytes Relative: 24 %
MCH: 29.2 pg (ref 26.0–34.0)
MCHC: 30.7 g/dL (ref 30.0–36.0)
MCV: 95 fL (ref 78.0–100.0)
Monocytes Absolute: 1 10*3/uL (ref 0.1–1.0)
Monocytes Relative: 8 %
NEUTROS PCT: 67 %
Neutro Abs: 8.6 10*3/uL — ABNORMAL HIGH (ref 1.7–7.7)
PLATELETS: 254 10*3/uL (ref 150–400)
RBC: 4.04 MIL/uL (ref 3.87–5.11)
RDW: 15.6 % — ABNORMAL HIGH (ref 11.5–15.5)
WBC: 12.8 10*3/uL — AB (ref 4.0–10.5)

## 2016-07-16 LAB — COMPREHENSIVE METABOLIC PANEL
ALBUMIN: 3.5 g/dL (ref 3.5–5.0)
ALK PHOS: 85 U/L (ref 38–126)
ALT: 16 U/L (ref 14–54)
AST: 21 U/L (ref 15–41)
Anion gap: 12 (ref 5–15)
BUN: 20 mg/dL (ref 6–20)
CALCIUM: 8.8 mg/dL — AB (ref 8.9–10.3)
CO2: 29 mmol/L (ref 22–32)
CREATININE: 1.02 mg/dL — AB (ref 0.44–1.00)
Chloride: 99 mmol/L — ABNORMAL LOW (ref 101–111)
GFR calc Af Amer: >60 mL/min (ref >60)
GFR calc non Af Amer: 55 mL/min — ABNORMAL LOW (ref >60)
GLUCOSE: 105 mg/dL — AB (ref 65–99)
Potassium: 2.8 mmol/L — ABNORMAL LOW (ref 3.5–5.1)
SODIUM: 140 mmol/L (ref 135–145)
Total Bilirubin: 0.6 mg/dL (ref 0.3–1.2)
Total Protein: 7.1 g/dL (ref 6.5–8.1)

## 2016-07-16 MED ORDER — POTASSIUM CHLORIDE CRYS ER 20 MEQ PO TBCR: 20.0000 meq | EXTENDED_RELEASE_TABLET | Freq: Three times a day (TID) | ORAL | 0 refills | Status: DC

## 2016-07-16 MED ORDER — HYDROCODONE-ACETAMINOPHEN 10-325 MG PO TABS: 1.0000 | ORAL_TABLET | Freq: Four times a day (QID) | ORAL | 0 refills | Status: DC | PRN

## 2016-07-16 NOTE — Progress Notes (Addendum)
Lindsay Fairy, PA-C [address] [place] [address]  Malignant neoplasm of other parts of pancreas ([place]) - Plan: HYDROcodone-acetaminophen (NORCO) 10-325 MG tablet, CBC with Differential, Comprehensive metabolic panel, Cancer antigen 19-9, MR Abdomen W Wo Contrast, CBC with Differential, Comprehensive metabolic panel  Hypokalemia - Plan: Comprehensive metabolic panel, Magnesium, Phosphorus, Vitamin D 25 hydroxy, potassium chloride SA (K-DUR,KLOR-CON) 20 MEQ tablet  CURRENT THERAPY: Due to repeated hospitalizations and no-shows, last treatment was on 05/15/2016 with Gemcitabine/Abraxane  INTERVAL HISTORY: Lindsay Salazar 66 y.o. female returns for followup of adenocarcinoma of pancreas, 4.2 cm in the body of pancreas with main pancreatic duct obstruction involving porto-splenic vein confluence, S/P EUS by Dr. Ardis Hughs on 11/29/2015 with FNA demonstrating adenocarcinoma.  CA 19-9 elevated initiation of treatment at 197.  Started on gemcitabine/Abraxane beginning on 12/19/2015 and a day 1, 8, 15 every 28 day fashion.  Course complicated by multiple hospitalizations unrelated to her malignancy or treatment.    Pancreatic cancer ([place])   11/29/2015 Procedure    EUS by Dr. Ardis Hughs- 4.2cm mass in the body of pancreas causing main pancreatic duct obstruction (and dilation) and focally involving the portosplenic vein confluence. The mass does not involve the SMA, celiac trunk.      11/30/2015 Pathology Results    FINE NEEDLE ASPIRATION, ENDOSCOPIC, PANCREAS BODY (SPECIMEN 1 OF 1 COLLECTED 11/30/15): MALIGNANT CELLS CONSISTENT WITH ADENOCARCINOMA.      12/18/2015 Procedure    Successful placement of right IJ approach port-a-cath with tip at the superior caval atrial junction by IR      12/19/2015 -  Chemotherapy    The patient had PACLitaxel-protein bound (ABRAXANE) chemo infusion 200 mg, 125 mg/m2 = 200 mg, Intravenous,  Once, 1 of 4 cycles  gemcitabine (GEMZAR) 1,634 mg in  sodium chloride 0.9 % 250 mL chemo infusion, 1,000 mg/m2 = 1,634 mg, Intravenous,  Once, 1 of 4 cycles  for chemotherapy treatment.        12/19/2015 Tumor Marker    Patient's tumor was tested for the following markers: CA 19-9. Results of the tumor marker test revealed 197.      12/24/2015 Imaging    CT angio chest- 1. No evidence of pulmonary embolus. 2. Mild scattered bilateral peribronchovascular opacity within the lungs, more nodular at the right middle lobe, likely reflects an atypical infectious process.      01/20/2016 - 01/23/2016 Hospital Admission    Admit date: 01/20/2016 Admission diagnosis: COPD exacerbation Additional comments:       01/28/2016 - 02/07/2016 Hospital Admission    Admit date: 01/28/2016 Admission diagnosis: Atrial fibrillation with rapid ventricular response Additional comments: CHADS vasc score 3, likely secondary to hypoxia, On metoprolol and Cardizem, and on Xarelto for anticoagulation      02/17/2016 - 02/19/2016 Hospital Admission    Admit date: 02/17/2016 Admission diagnosis: .Bright red blood per rectum. Additional comments: Anoscopy performed.  Low volume hematochezia appears to be secondary to hemorrhoidal bleeding. Anoscopy in emergency room did not reveal evidence of gross bleeding. Nevertheless patient needs colonoscopy at some point as she has never undergone this exam in the past. Patient however is not agreeable to undergo colonoscopy during this admission but will consider at a later date.      03/19/2016 Miscellaneous    Patient seen by Dr. Barry Dienes for consideration of distal pancreatectomy.  She is arranged for patient to undergo clearance from a pulmonology and cardiology standpoint.  Once cleared, she will  plan on taking patient to surgery.  In the interim, we will continue with neoadjuvant chemotherapy.      03/28/2016 - 04/04/2016 Hospital Admission    Admit date: 03/28/2016 Admission diagnosis: Acute on chronic respiratory failure,  COPD exacerbation       04/26/2016 - 05/01/2016 Hospital Admission    Admit date: 04/26/16 Admission diagnosis: Right LE cellulitis       05/08/2016 - 05/12/2016 Hospital Admission    Admit date: 05/08/16 Admission diagnosis: COPD exacerbation        05/15/2016 Treatment Plan Change    Remainder of cycle #5 (days 8 & 15 of cycle) discontinued given multiple hospitalizations.  Proceed with cycle #6 Gemzar/Abraxane today.        HPI Elements   Location: Body of pancreas  Quality: Adenocarcinoma  Severity: Local  Duration: Dx in Oct 2017  Context: Positive response to therapy with Gemcitabine/Abraxane beginning on 12/19/2015.  Timing: Last treated in April 2018 due to noncompliance and hospitalizations  Modifying Factors: Noncompliance, COPD, poor hygiene, low socioeconomic status  Associated Signs & Symptoms:    She notes that her leg swelling is resolved.  She was recently discharged from the [facility] 1-1/2 weeks ago.  She continues to have issues with walking.  She is having home health at home to help with physical therapy.  She reports that her legs are weak.  She admits to multiple falls.  She denies any LOC.  She proudly reports that her boyfriend is taking care of her personal hygiene, unfortunately this is a bad thing.  She requests a refill on her pain medication.  Review of Systems  Constitutional: Negative.  Negative for chills, fever and weight loss.  HENT: Negative.   Eyes: Negative.   Respiratory: Negative.  Negative for cough.   Cardiovascular: Negative.  Negative for chest pain.  Gastrointestinal: Negative.  Negative for blood in stool, constipation, diarrhea, melena, nausea and vomiting.  Genitourinary: Negative.   Musculoskeletal: Positive for falls.  Skin: Negative.   Neurological: Negative.  Negative for weakness.  Endo/Heme/Allergies: Negative.   Psychiatric/Behavioral: Negative.     Past Medical History:  Diagnosis Date  . Anemia of chronic disease     . Arthritis    bursitis , fibromyalgia  . Arthritis    "right hip" (11/27/2015)  . Asthma   . Chronic bronchitis ([place])   . Chronic respiratory failure ([place])   . Chronic right hip pain   . COPD (chronic obstructive pulmonary disease) ([place])    wears home o2 prn  . Daily headache    "last 2-3 months" (11/27/2015)  . DVT (deep venous thrombosis) ([place]) 03/2016  . Exposure to TB    "mom had it when she was pregnant with me"  . Fibromyalgia   . GERD (gastroesophageal reflux disease)    use to have it but not now  . Heart murmur    51-  79 years old  . Hyperkalemia   . Hypertension   . On home oxygen therapy    11/27/2015 "whenever I need it; don't know how many liters"  . PAF (paroxysmal atrial fibrillation) ([place])   . Pancreatic cancer ([place])   . Paroxysmal atrial flutter ([place])   . Pneumonia    "several times" (11/27/2015)  . Pneumothorax     Past Surgical History:  Procedure Laterality Date  . ANTERIOR CERVICAL DECOMP/DISCECTOMY FUSION     "put 4 screws in"  . BACK SURGERY    . CARDIAC CATHETERIZATION  08/2004   /  notes 06/18/2010  . CARDIOVERSION N/A 07/18/2015   Procedure: CARDIOVERSION;  Surgeon: Josue Hector, MD;  Location[facility];  Service: Cardiovascular;  Laterality: N/A;  . ESOPHAGOGASTRODUODENOSCOPY N/A 08/09/2015   Procedure: ESOPHAGOGASTRODUODENOSCOPY (EGD);  Surgeon: Rogene Houston, MD;  Location: [facility];  Service: Endoscopy;  Laterality: N/A;  . EUS N/A 11/29/2015   Procedure: UPPER ENDOSCOPIC ULTRASOUND (EUS) RADIAL;  Surgeon: Milus Banister, MD;  Location: [facility] ENDOSCOPY;  Service: Endoscopy;  Laterality: N/A;  . IR GENERIC HISTORICAL  12/18/2015   IR US GUIDE VASC ACCESS RIGHT 12/18/2015 Sandi Mariscal, MD [facility]  . IR GENERIC HISTORICAL  12/18/2015   IR FLUORO GUIDE PORT INSERTION RIGHT 12/18/2015 Sandi Mariscal, MD [facility]  . TONSILLECTOMY    . TUBAL LIGATION      Family History  Problem Relation Age of Onset  . COPD Mother   .  Diabetes Father   . Stroke Father     Social History   Social History  . Marital status: Divorced    Spouse name: N/A  . Number of children: N/A  . Years of education: N/A   Occupational History  . retired    Social History Main Topics  . Smoking status: Former Smoker    Packs/day: 0.50    Years: 46.00    Types: Cigarettes    Start date: 10/09/1969    Quit date: 11/29/2015  . Smokeless tobacco: Never Used  . Alcohol use No     Comment: quit 30 years ago  . Drug use: No  . Sexual activity: Not Currently    Birth control/ protection: Surgical, Post-menopausal   Other Topics Concern  . None   Social History Narrative  . None     PHYSICAL EXAMINATION  ECOG PERFORMANCE STATUS: 2 - Symptomatic, <50% confined to bed  Vitals:   07/16/16 1323  BP: 110/90  Pulse: (!) 116  Resp: 18  Temp: 98.4 F (36.9 C)    GENERAL:alert, no distress, cachectic, smiling and in wheelchair, significantly malodorous, accompanied by her boyfriend, significant facial hair. SKIN: skin color, texture, turgor are normal, no rashes or significant lesions HEAD: Normocephalic, No masses, lesions, tenderness or abnormalities EYES: normal, EOMI, Conjunctiva are pink and non-injected EARS: External ears normal OROPHARYNX:lips, buccal mucosa, and tongue normal and mucous membranes are moist  NECK: supple, no adenopathy, trachea midline LYMPH:  no palpable lymphadenopathy BREAST:not examined LUNGS: positive findings: wheezing  diffusely HEART: regular rate & rhythm, no murmurs and no gallops ABDOMEN:abdomen soft and normal bowel sounds BACK: Back symmetric, no curvature. EXTREMITIES:less then 2 second capillary refill, no joint deformities, effusion, or inflammation, no edema, no skin discoloration, no cyanosis, multiple LE ecchymoses.  NEURO: alert & oriented x 3 with fluent speech, no focal motor/sensory deficits, in wheelchair.   LABORATORY DATA: CBC    Component Value Date/Time   WBC  12.8 (H) 07/16/2016 1351   RBC 4.04 07/16/2016 1351   HGB 11.8 (L) 07/16/2016 1351   HCT 38.4 07/16/2016 1351   PLT 254 07/16/2016 1351   MCV 95.0 07/16/2016 1351   MCH 29.2 07/16/2016 1351   MCHC 30.7 07/16/2016 1351   RDW 15.6 (H) 07/16/2016 1351   LYMPHSABS 3.0 07/16/2016 1351   MONOABS 1.0 07/16/2016 1351   EOSABS 0.1 07/16/2016 1351   BASOSABS 0.0 07/16/2016 1351      Chemistry      Component Value Date/Time   NA 140 07/16/2016 1351   K 2.8 (L) 07/16/2016 1351  CL 99 (L) 07/16/2016 1351   CO2 29 07/16/2016 1351   BUN 20 07/16/2016 1351   CREATININE 1.02 (H) 07/16/2016 1351      Component Value Date/Time   CALCIUM 8.8 (L) 07/16/2016 1351   ALKPHOS 85 07/16/2016 1351   AST 21 07/16/2016 1351   ALT 16 07/16/2016 1351   BILITOT 0.6 07/16/2016 1351       PENDING LABS:   RADIOGRAPHIC STUDIES:  No results found.   PATHOLOGY:    ASSESSMENT AND PLAN:  Pancreatic cancer ([place]) Adenocarcinoma of pancreas, 4.2 cm in the body of pancreas with main pancreatic duct obstruction involving porto-splenic vein confluence, S/P EUS by Dr. Ardis Hughs on 11/29/2015 with FNA demonstrating adenocarcinoma.  CA 19-9 elevated initiation of treatment at 197.  Started on gemcitabine/Abraxane beginning on 12/19/2015 and a day 1, 8, 15 every 28 day fashion.  Course complicated by multiple hospitalizations unrelated to her malignancy or treatment.  Labs today: CBC diff, CMET, CA 19-9.  I personally reviewed and went over laboratory results with the patient.  The results are noted within this dictation.  Hypokalemia is noted.  Kdur 60 mEq daily is escribed.  Labs at next follow-up visit: CBC diff, CMET, Magnesium, Phosphorus, and Vit D.  Given a long lapse in systemic treatment, last treated in April 2018, due to repeated hospitalizations, will restage with MRI abd.  Order placed for MRI abd.  She has seen cardiology for cardiac clearance for definitive surgery.  She is S/P ECHO in April  2018.  I am not sure where we stand with this.    If MRI is stable, will refer the patient back to Dr. Barry Dienes for surgical intervention.  If progression of disease is noted, then re-initiation of chemotherapy would be reasonable.  I have refilled the patient's pain medication after reviewing the [facility] Controlled Substance Reporting System.  She last got this filled, #60 on 07/03/2016. Her new Rx is dated to be filled at the end of June 2018.  Return in 1-2 weeks for follow-up, review of lab results, review of MRI results, and medical oncology recommendations.   ORDERS PLACED FOR THIS ENCOUNTER: Orders Placed This Encounter  Procedures  . MR Abdomen W Wo Contrast  . CBC with Differential  . Comprehensive metabolic panel  . Cancer antigen 19-9  . CBC with Differential  . Comprehensive metabolic panel  . Magnesium  . Phosphorus  . Vitamin D 25 hydroxy    MEDICATIONS PRESCRIBED THIS ENCOUNTER: Meds ordered this encounter  Medications  . HYDROcodone-acetaminophen (NORCO) 10-325 MG tablet    Sig: Take 1 tablet by mouth every 6 (six) hours as needed.    Dispense:  60 tablet    Refill:  0    To be filled on 07/31/2016    Order Specific Question:   Supervising Provider    Answer:   Brunetta Genera [[id number]]  . potassium chloride SA (K-DUR,KLOR-CON) 20 MEQ tablet    Sig: Take 1 tablet (20 mEq total) by mouth 3 (three) times daily.    Dispense:  90 tablet    Refill:  0    Order Specific Question:   Supervising Provider    Answer:   Brunetta Genera [[id number]]    THERAPY PLAN:  Restaging scans.  All questions were answered. The patient knows to call the clinic with any problems, questions or concerns. We can certainly see the patient much sooner if necessary.  Patient and plan discussed with Dr. Twana First and she  is in agreement with the aforementioned.   This note is electronically signed by: Doy Mince 07/16/2016 3:42 PM

## 2016-07-16 NOTE — Patient Instructions (Addendum)
[place] at [facility] Discharge Instructions  RECOMMENDATIONS MADE BY THE CONSULTANT AND ANY TEST RESULTS WILL BE SENT TO YOUR REFERRING PHYSICIAN.  You saw Kirby Crigler, PA-C, today. Labs today. MRI soon Follow up after MRI   Thank you for choosing [place] at [facility] to provide your oncology and hematology care.  To afford each patient quality time with our provider, please arrive at least 15 minutes before your scheduled appointment time.    If you have a lab appointment with the [facility] please come in thru the  Main Entrance and check in at the main information desk  You need to re-schedule your appointment should you arrive 10 or more minutes late.  We strive to give you quality time with our providers, and arriving late affects you and other patients whose appointments are after yours.  Also, if you no show three or more times for appointments you may be dismissed from the clinic at the providers discretion.     Again, thank you for choosing [facility].  Our hope is that these requests will decrease the amount of time that you wait before being seen by our physicians.       _____________________________________________________________  Should you have questions after your visit to [facility], please contact our office at [phone] between the hours of 8:30 a.m. and 4:30 p.m.  Voicemails left after 4:30 p.m. will not be returned until the following business day.  For prescription refill requests, have your pharmacy contact our office.       Resources For Cancer Patients and their Caregivers ? American Cancer Society: Can assist with transportation, wigs, general needs, runs Look Good Feel Better.        207-584-9537 ? Cancer Care: Provides financial assistance, online support groups, medication/co-pay assistance.  1-800-813-HOPE 7137603861) ? [place] [facility] Assists  [place] cancer patients and their families through emotional , educational and financial support.  419-098-1800 ? [place] DSS Where to apply for food stamps, Medicaid and utility assistance. (519)031-2821 ? [facility]: Transportation to medical appointments. 862-343-5831 ? Social Security Administration: May apply for disability if have a Stage IV cancer. (703)744-5788 (628)378-1843 ? [employer], Disability and Transit Services: Assists with nutrition, care and transit needs. [place] Support Programs: @10RELATIVEDAYS @ > Cancer Support Group  2nd Tuesday of the month 1pm-2pm, Journey Room  > Creative Journey  3rd Tuesday of the month 1130am-1pm, Journey Room  > Look Good Feel Better  1st Wednesday of the month 10am-12 noon, Journey Room (Call [place] to register [phone])

## 2016-07-16 NOTE — Addendum Note (Signed)
Addended by: Baird [facility] on: 07/16/2016 03:42 PM   Modules accepted: Orders, Level of Service

## 2016-07-16 NOTE — Addendum Note (Signed)
Addended by: Baird Cancer on: 07/16/2016 03:40 PM   Modules accepted: Orders

## 2016-07-16 NOTE — Assessment & Plan Note (Addendum)
Adenocarcinoma of pancreas, 4.2 cm in the body of pancreas with main pancreatic duct obstruction involving porto-splenic vein confluence, S/P EUS by Dr. Ardis Hughs on 11/29/2015 with FNA demonstrating adenocarcinoma.  CA 19-9 elevated initiation of treatment at 197.  Started on gemcitabine/Abraxane beginning on 12/19/2015 and a day 1, 8, 15 every 28 day fashion.  Course complicated by multiple hospitalizations unrelated to her malignancy or treatment.  Labs today: CBC diff, CMET, CA 19-9.  I personally reviewed and went over laboratory results with the patient.  The results are noted within this dictation.  Hypokalemia is noted.  Kdur 60 mEq daily is escribed.  Labs at next follow-up visit: CBC diff, CMET, Magnesium, Phosphorus, and Vit D.  Given a long lapse in systemic treatment, last treated in April 2018, due to repeated hospitalizations, will restage with MRI abd.  Order placed for MRI abd.  She has seen cardiology for cardiac clearance for definitive surgery.  She is S/P ECHO in April 2018.  I am not sure where we stand with this.    If MRI is stable, will refer the patient back to Dr. Barry Dienes for surgical intervention.  If progression of disease is noted, then re-initiation of chemotherapy would be reasonable.  I have refilled the patient's pain medication after reviewing the [facility] Controlled Substance Reporting System.  She last got this filled, #60 on 07/03/2016. Her new Rx is dated to be filled at the end of June 2018.  Return in 1-2 weeks for follow-up, review of lab results, review of MRI results, and medical oncology recommendations.

## 2016-07-17 ENCOUNTER — Other Ambulatory Visit (HOSPITAL_COMMUNITY): Payer: Self-pay

## 2016-07-17 DIAGNOSIS — E876 Hypokalemia: Secondary | ICD-10-CM

## 2016-07-17 LAB — CANCER ANTIGEN 19-9: CA 19 9: 98 U/mL — AB (ref 0–35)

## 2016-07-17 MED ORDER — POTASSIUM CHLORIDE CRYS ER 20 MEQ PO TBCR: 20.0000 meq | EXTENDED_RELEASE_TABLET | Freq: Three times a day (TID) | ORAL | 0 refills | Status: DC

## 2016-07-21 ENCOUNTER — Other Ambulatory Visit: Payer: Self-pay

## 2016-07-21 ENCOUNTER — Encounter (HOSPITAL_COMMUNITY): Payer: Self-pay | Admitting: Emergency Medicine

## 2016-07-21 ENCOUNTER — Emergency Department (HOSPITAL_COMMUNITY): Payer: Medicare HMO

## 2016-07-21 ENCOUNTER — Observation Stay (HOSPITAL_COMMUNITY)
Admission: EM | Admit: 2016-07-21 | Discharge: 2016-07-22 | Disposition: A | Discharge status: Home / Self Care | Payer: Medicare HMO | Attending: Internal Medicine | Admitting: Internal Medicine

## 2016-07-21 DIAGNOSIS — J45909 Unspecified asthma, uncomplicated: Secondary | ICD-10-CM | POA: Diagnosis not present

## 2016-07-21 DIAGNOSIS — Z8507 Personal history of malignant neoplasm of pancreas: Secondary | ICD-10-CM | POA: Insufficient documentation

## 2016-07-21 DIAGNOSIS — J441 Chronic obstructive pulmonary disease with (acute) exacerbation: Secondary | ICD-10-CM | POA: Diagnosis not present

## 2016-07-21 DIAGNOSIS — Z79899 Other long term (current) drug therapy: Secondary | ICD-10-CM | POA: Diagnosis not present

## 2016-07-21 DIAGNOSIS — J9621 Acute and chronic respiratory failure with hypoxia: Secondary | ICD-10-CM | POA: Diagnosis not present

## 2016-07-21 DIAGNOSIS — Z87891 Personal history of nicotine dependence: Secondary | ICD-10-CM | POA: Diagnosis not present

## 2016-07-21 DIAGNOSIS — I10 Essential (primary) hypertension: Secondary | ICD-10-CM | POA: Insufficient documentation

## 2016-07-21 DIAGNOSIS — D638 Anemia in other chronic diseases classified elsewhere: Secondary | ICD-10-CM | POA: Diagnosis present

## 2016-07-21 DIAGNOSIS — I4891 Unspecified atrial fibrillation: Secondary | ICD-10-CM | POA: Diagnosis present

## 2016-07-21 DIAGNOSIS — N183 Chronic kidney disease, stage 3 unspecified: Secondary | ICD-10-CM | POA: Diagnosis present

## 2016-07-21 DIAGNOSIS — Z7951 Long term (current) use of inhaled steroids: Secondary | ICD-10-CM | POA: Diagnosis not present

## 2016-07-21 DIAGNOSIS — R05 Cough: Secondary | ICD-10-CM | POA: Diagnosis present

## 2016-07-21 DIAGNOSIS — C259 Malignant neoplasm of pancreas, unspecified: Secondary | ICD-10-CM | POA: Diagnosis present

## 2016-07-21 LAB — BASIC METABOLIC PANEL
ANION GAP: 9 (ref 5–15)
BUN: 22 mg/dL — ABNORMAL HIGH (ref 6–20)
CALCIUM: 9.1 mg/dL (ref 8.9–10.3)
CO2: 25 mmol/L (ref 22–32)
CREATININE: 1.07 mg/dL — AB (ref 0.44–1.00)
Chloride: 106 mmol/L (ref 101–111)
GFR, EST NON AFRICAN AMERICAN: 52 mL/min — AB (ref >60)
Glucose, Bld: 145 mg/dL — ABNORMAL HIGH (ref 65–99)
Potassium: 3.8 mmol/L (ref 3.5–5.1)
SODIUM: 140 mmol/L (ref 135–145)

## 2016-07-21 LAB — HEPATIC FUNCTION PANEL
ALBUMIN: 3.4 g/dL — AB (ref 3.5–5.0)
ALT: 12 U/L — ABNORMAL LOW (ref 14–54)
AST: 17 U/L (ref 15–41)
Alkaline Phosphatase: 84 U/L (ref 38–126)
BILIRUBIN INDIRECT: 0.5 mg/dL (ref 0.3–0.9)
BILIRUBIN TOTAL: 0.6 mg/dL (ref 0.3–1.2)
Bilirubin, Direct: 0.1 mg/dL (ref 0.1–0.5)
Total Protein: 6.9 g/dL (ref 6.5–8.1)

## 2016-07-21 LAB — CBC
HCT: 37.9 % (ref 36.0–46.0)
HEMOGLOBIN: 11.7 g/dL — AB (ref 12.0–15.0)
MCH: 29.3 pg (ref 26.0–34.0)
MCHC: 30.9 g/dL (ref 30.0–36.0)
MCV: 95 fL (ref 78.0–100.0)
PLATELETS: 260 10*3/uL (ref 150–400)
RBC: 3.99 MIL/uL (ref 3.87–5.11)
RDW: 15.5 % (ref 11.5–15.5)
WBC: 10.4 10*3/uL (ref 4.0–10.5)

## 2016-07-21 LAB — DIFFERENTIAL
BASOS ABS: 0 10*3/uL (ref 0.0–0.1)
BASOS PCT: 0 %
EOS ABS: 0.2 10*3/uL (ref 0.0–0.7)
Eosinophils Relative: 2 %
LYMPHS ABS: 2.8 10*3/uL (ref 0.7–4.0)
Lymphocytes Relative: 28 %
Monocytes Absolute: 0.8 10*3/uL (ref 0.1–1.0)
Monocytes Relative: 8 %
NEUTROS PCT: 62 %
Neutro Abs: 6.4 10*3/uL (ref 1.7–7.7)

## 2016-07-21 LAB — I-STAT CG4 LACTIC ACID, ED: Lactic Acid, Venous: 1.8 mmol/L (ref 0.5–1.9)

## 2016-07-21 MED ORDER — RIVAROXABAN 15 MG PO TABS
15.0000 mg | ORAL_TABLET | Freq: Every day | ORAL | Status: DC
  Administered 2016-07-21: 15 mg via ORAL
  Filled 2016-07-21: qty 1

## 2016-07-21 MED ORDER — ALBUTEROL SULFATE (2.5 MG/3ML) 0.083% IN NEBU: 2.5000 mg | INHALATION_SOLUTION | RESPIRATORY_TRACT | Status: DC | PRN

## 2016-07-21 MED ORDER — DOXYCYCLINE HYCLATE 100 MG PO TABS
100.0000 mg | ORAL_TABLET | Freq: Two times a day (BID) | ORAL | Status: DC
  Administered 2016-07-21 – 2016-07-22 (×2): 100 mg via ORAL
  Filled 2016-07-21 (×3): qty 1

## 2016-07-21 MED ORDER — DILTIAZEM HCL ER COATED BEADS 240 MG PO CP24
240.0000 mg | ORAL_CAPSULE | Freq: Every day | ORAL | Status: DC
  Administered 2016-07-22: 240 mg via ORAL
  Filled 2016-07-21: qty 1

## 2016-07-21 MED ORDER — MOMETASONE FURO-FORMOTEROL FUM 200-5 MCG/ACT IN AERO
INHALATION_SPRAY | RESPIRATORY_TRACT | Status: AC
  Filled 2016-07-21: qty 8.8

## 2016-07-21 MED ORDER — ONDANSETRON HCL 4 MG PO TABS: 4.0000 mg | ORAL_TABLET | Freq: Four times a day (QID) | ORAL | Status: DC | PRN

## 2016-07-21 MED ORDER — METOPROLOL TARTRATE 50 MG PO TABS
50.0000 mg | ORAL_TABLET | Freq: Two times a day (BID) | ORAL | Status: DC
  Administered 2016-07-21 – 2016-07-22 (×2): 50 mg via ORAL
  Filled 2016-07-21 (×2): qty 1

## 2016-07-21 MED ORDER — HYDROCODONE-ACETAMINOPHEN 10-325 MG PO TABS
1.0000 | ORAL_TABLET | Freq: Four times a day (QID) | ORAL | Status: DC | PRN
  Administered 2016-07-21 – 2016-07-22 (×3): 1 via ORAL
  Filled 2016-07-21 (×3): qty 1

## 2016-07-21 MED ORDER — LEVOFLOXACIN IN D5W 500 MG/100ML IV SOLN
500.0000 mg | Freq: Once | INTRAVENOUS | Status: AC
  Administered 2016-07-21: 500 mg via INTRAVENOUS
  Filled 2016-07-21: qty 100

## 2016-07-21 MED ORDER — ALBUTEROL SULFATE (2.5 MG/3ML) 0.083% IN NEBU
5.0000 mg | INHALATION_SOLUTION | Freq: Once | RESPIRATORY_TRACT | Status: AC
  Administered 2016-07-21: 5 mg via RESPIRATORY_TRACT
  Filled 2016-07-21: qty 6

## 2016-07-21 MED ORDER — FUROSEMIDE 40 MG PO TABS
40.0000 mg | ORAL_TABLET | Freq: Every day | ORAL | Status: DC
  Administered 2016-07-22: 40 mg via ORAL
  Filled 2016-07-21: qty 1

## 2016-07-21 MED ORDER — IPRATROPIUM-ALBUTEROL 0.5-2.5 (3) MG/3ML IN SOLN
3.0000 mL | Freq: Four times a day (QID) | RESPIRATORY_TRACT | Status: DC
  Administered 2016-07-21 – 2016-07-22 (×4): 3 mL via RESPIRATORY_TRACT
  Filled 2016-07-21 (×4): qty 3

## 2016-07-21 MED ORDER — ACETAMINOPHEN 325 MG PO TABS: 650.0000 mg | ORAL_TABLET | Freq: Four times a day (QID) | ORAL | Status: DC | PRN

## 2016-07-21 MED ORDER — ACETAMINOPHEN 650 MG RE SUPP: 650.0000 mg | Freq: Four times a day (QID) | RECTAL | Status: DC | PRN

## 2016-07-21 MED ORDER — METHYLPREDNISOLONE SODIUM SUCC 125 MG IJ SOLR
125.0000 mg | Freq: Once | INTRAMUSCULAR | Status: AC
  Administered 2016-07-21: 125 mg via INTRAVENOUS
  Filled 2016-07-21: qty 2

## 2016-07-21 MED ORDER — METHYLPREDNISOLONE SODIUM SUCC 125 MG IJ SOLR
60.0000 mg | Freq: Two times a day (BID) | INTRAMUSCULAR | Status: DC
  Administered 2016-07-22: 60 mg via INTRAVENOUS
  Administered 2016-07-22: 62.5 mg via INTRAVENOUS
  Filled 2016-07-21 (×2): qty 2

## 2016-07-21 MED ORDER — PANTOPRAZOLE SODIUM 40 MG PO TBEC
40.0000 mg | DELAYED_RELEASE_TABLET | Freq: Every day | ORAL | Status: DC
  Administered 2016-07-22: 40 mg via ORAL
  Filled 2016-07-21: qty 1

## 2016-07-21 MED ORDER — ONDANSETRON HCL 4 MG/2ML IJ SOLN: 4.0000 mg | Freq: Four times a day (QID) | INTRAMUSCULAR | Status: DC | PRN

## 2016-07-21 MED ORDER — DOCUSATE SODIUM 100 MG PO CAPS
100.0000 mg | ORAL_CAPSULE | Freq: Two times a day (BID) | ORAL | Status: DC
  Administered 2016-07-21 – 2016-07-22 (×2): 100 mg via ORAL
  Filled 2016-07-21 (×2): qty 1

## 2016-07-21 MED ORDER — TEMAZEPAM 15 MG PO CAPS
15.0000 mg | ORAL_CAPSULE | Freq: Every day | ORAL | Status: DC
  Administered 2016-07-21: 15 mg via ORAL
  Filled 2016-07-21: qty 1

## 2016-07-21 MED ORDER — LORAZEPAM 0.5 MG PO TABS
0.5000 mg | ORAL_TABLET | Freq: Four times a day (QID) | ORAL | Status: DC | PRN
  Administered 2016-07-21 – 2016-07-22 (×2): 0.5 mg via ORAL
  Filled 2016-07-21 (×2): qty 1

## 2016-07-21 MED ORDER — MOMETASONE FURO-FORMOTEROL FUM 200-5 MCG/ACT IN AERO
2.0000 | INHALATION_SPRAY | Freq: Two times a day (BID) | RESPIRATORY_TRACT | Status: DC
  Administered 2016-07-21 – 2016-07-22 (×2): 2 via RESPIRATORY_TRACT
  Filled 2016-07-21: qty 8.8

## 2016-07-21 MED ORDER — SODIUM CHLORIDE 0.9 % IV BOLUS (SEPSIS)
500.0000 mL | Freq: Once | INTRAVENOUS | Status: AC
  Administered 2016-07-21: 500 mL via INTRAVENOUS

## 2016-07-21 NOTE — H&P (Signed)
History and Physical    Lindsay Salazar ERX:[id number] DOB: 07/10/1950 DOA: 07/21/2016  PCP: [name], PA-C Consultants: Talbert Cage - oncology; Harl Bowie - cardiology; Luna Glasgow - orthopedics Patient coming from: home - lives with boyfriend; [facility]: boyfriend and daughter, [phone]  Chief Complaint: COPD acting up  HPI: Lindsay Salazar is a 66 y.o. female with medical history significant of chronic respiratory failure on 2 L oxygen, COPD, chronic atrial fibrillation, pancreatic cancer , with recent hospital stay, just discharged yesterday for images cellulitis, and COPD.  She was hospitalized twice in May for COPD/PNA and was discharged on 5/8 to [facility].  Patient has been home 3 weeks.  Patient was "rattling in my chest" when she left.  She has been taking her medications since discharge.  Boyfriend was concerned because her color wasn't good.  This AM, difficulty breathing upon waking up.  Used breathing treatments and took meds.  Reports she has not been out of her house but she has been outside sitting in her wheelchair on the front porch..  She reports that she stopped smoking when she was diagnosed with cancer, although on my last admission on 5/6 she reported that she was still smoking but less than prior.  About 11 am today she started "feeling funky."  Concerned it was her COPD acting up.  Her boyfriend was worried about her color and she told him she had been freezing to death all night long.  "Come to find out I had fever of 108."  She got more stuff done to her upon arrival in the ER "than I ever seen in my life, they done good up here."  Currently feeling a little winded and sleepy.  Wears 2L at home, on 2L here.    +cough productive of milky sputum.  Pooped well in ER for the first time in 2 weeks.   ED Course:  COPD-E, given Albuterol, Solumedrol, and Levaquin with some improvement.  Review of Systems: As per HPI; otherwise review of systems reviewed and negative.   Ambulatory Status:   Ambulates with a cane mostly  Past Medical History:  Diagnosis Date  . Anemia of chronic disease   . Arthritis    bursitis , fibromyalgia  . Arthritis    "right hip" (11/27/2015)  . Asthma   . Chronic bronchitis ([place])   . Chronic respiratory failure ([place])   . Chronic right hip pain   . COPD (chronic obstructive pulmonary disease) ([place])    wears home o2 prn  . Daily headache    "last 2-3 months" (11/27/2015)  . DVT (deep venous thrombosis) ([place]) 03/2016  . Exposure to TB    "mom had it when she was pregnant with me"  . Fibromyalgia   . GERD (gastroesophageal reflux disease)    use to have it but not now  . Heart murmur    95-  17 years old  . Hyperkalemia   . Hypertension   . On home oxygen therapy    11/27/2015 "whenever I need it; don't know how many liters"  . PAF (paroxysmal atrial fibrillation) ([place])   . Pancreatic cancer ([place])   . Paroxysmal atrial flutter ([place])   . Pneumonia    "several times" (11/27/2015)  . Pneumothorax     Past Surgical History:  Procedure Laterality Date  . ANTERIOR CERVICAL DECOMP/DISCECTOMY FUSION     "put 4 screws in"  . BACK SURGERY    . CARDIAC CATHETERIZATION  08/2004   Archie Endo 06/18/2010  .  CARDIOVERSION N/A 07/18/2015   Procedure: CARDIOVERSION;  Surgeon: Josue Hector, MD;  Location: [facility];  Service: Cardiovascular;  Laterality: N/A;  . ESOPHAGOGASTRODUODENOSCOPY N/A 08/09/2015   Procedure: ESOPHAGOGASTRODUODENOSCOPY (EGD);  Surgeon: Rogene Houston, MD;  Location: [facility] ENDO SUITE;  Service: Endoscopy;  Laterality: N/A;  . EUS N/A 11/29/2015   Procedure: UPPER ENDOSCOPIC ULTRASOUND (EUS) RADIAL;  Surgeon: Milus Banister, MD;  Location: [facility] ENDOSCOPY;  Service: Endoscopy;  Laterality: N/A;  . IR GENERIC HISTORICAL  12/18/2015   IR US GUIDE VASC ACCESS RIGHT 12/18/2015 Sandi Mariscal, MD [facility]-INTER[facility]  . IR GENERIC HISTORICAL  12/18/2015   IR FLUORO GUIDE PORT INSERTION RIGHT 12/18/2015 Sandi Mariscal, MD [facility]  . TONSILLECTOMY     . TUBAL LIGATION      Social History   Social History  . Marital status: Divorced    Spouse name: N/A  . Number of children: N/A  . Years of education: N/A   Occupational History  . retired    Social History Main Topics  . Smoking status: Former Smoker    Packs/day: 0.50    Years: 46.00    Types: Cigarettes    Start date: 10/09/1969    Quit date: 11/29/2015  . Smokeless tobacco: Never Used  . Alcohol use No     Comment: quit 30 years ago  . Drug use: No  . Sexual activity: Not Currently    Birth control/ protection: Surgical, Post-menopausal   Other Topics Concern  . Not on file   Social History Narrative  . No narrative on file    Allergies  Allergen Reactions  . Ibuprofen Nausea And Vomiting  . Other     Seafood   . Penicillins Swelling    Has patient had a PCN reaction causing immediate rash, facial/tongue/throat swelling, SOB or lightheadedness with hypotension: Yes Has patient had a PCN reaction causing severe rash involving mucus membranes or skin necrosis: No Has patient had a PCN reaction that required hospitalization No Has patient had a PCN reaction occurring within the last 10 years: No If all of the above answers are "NO", then may proceed with Cephalosporin use.   . Tiotropium Bromide Monohydrate Itching  . Tramadol Nausea And Vomiting    Family History  Problem Relation Age of Onset  . COPD Mother   . Diabetes Father   . Stroke Father     Prior to Admission medications   Medication Sig Start Date End Date Taking? Authorizing Provider  COMBIVENT RESPIMAT 20-100 MCG/ACT AERS respimat Inhale 1 puff into the lungs 2 (two) times daily. 12/03/15  Yes Rai, Ripudeep K, MD  diltiazem (CARDIZEM CD) 240 MG 24 hr capsule Take 1 capsule (240 mg total) by mouth daily. 05/23/16 08/21/16 Yes Lendon Colonel, NP  furosemide (LASIX) 40 MG tablet Take 1 tablet (40 mg total) by mouth daily. 06/06/16  Yes Kathie Dike, MD  HYDROcodone-acetaminophen (NORCO)  10-325 MG tablet Take 1 tablet by mouth every 6 (six) hours as needed. 07/16/16  Yes Kefalas, Manon Hilding, PA-C  ipratropium-albuterol (DUONEB) 0.5-2.5 (3) MG/3ML SOLN Inhale 3 mLs into the lungs every 6 (six) hours as needed (for shortness of breath).  03/03/16  Yes [provider]  LORazepam (ATIVAN) 0.5 MG tablet Take 0.5 mg by mouth every 6 (six) hours as needed for anxiety.   Yes [provider]  metoprolol tartrate (LOPRESSOR) 50 MG tablet Take 1 tablet (50 mg total) by mouth 2 (two) times daily. 03/02/16  Yes Kathie Dike, MD  OXYGEN Inhale 2 L into the lungs daily as needed (for breathing).   Yes [provider]  pantoprazole (PROTONIX) 40 MG tablet Take 1 tablet (40 mg total) by mouth daily. 12/03/15  Yes Rai, Ripudeep K, MD  potassium chloride SA (K-DUR,KLOR-CON) 20 MEQ tablet Take 1 tablet (20 mEq total) by mouth 3 (three) times daily. 07/17/16  Yes Kefalas, Manon Hilding, PA-C  Rivaroxaban (XARELTO) 15 MG TABS tablet Take 1 tablet (15 mg total) by mouth daily with supper. 05/01/16  Yes Tat, Shanon Brow, MD  SYMBICORT 160-4.5 MCG/ACT inhaler Inhale 1 puff into the lungs 2 (two) times daily. 12/03/15  Yes Rai, Ripudeep K, MD  temazepam (RESTORIL) 15 MG capsule Take 15 mg by mouth at bedtime.   Yes [provider]  guaiFENesin (MUCINEX) 600 MG 12 hr tablet Take 2 tablets (1,200 mg total) by mouth 2 (two) times daily. 06/02/16   Raiford Noble Latif, DO  menthol-cetylpyridinium (CEPACOL) 3 MG lozenge Take 1 lozenge (3 mg total) by mouth as needed for sore throat. 06/02/16   Sheikh, Omair Latif, DO  predniSONE (DELTASONE) 10 MG tablet Take 1 tablet (10 mg total) by mouth daily with breakfast. Take 6 tablets today and then decrease by 1 tablet daily until none are left. 06/10/16   Isaac Bliss, Rayford Halsted, MD    Physical Exam: Vitals:   07/21/16 1830 07/21/16 1852 07/21/16 2010 07/21/16 2144  BP: 114/75 132/84  121/69  Pulse:  100  92  Resp: 19 20  20   Temp:  98.6 F (37  C)  98.3 F (36.8 C)  TempSrc:  Oral  Oral  SpO2:  96% 98% 100%  Weight:  52 kg (114 lb 11.2 oz)    Height:  5\' 4"  (1.626 m)       General:  Appears calm and comfortable and is NAD Eyes:  PERRL, EOMI, normal lids, iris ENT:  grossly normal hearing, lips & tongue, mmm Neck:  no LAD, masses or thyromegaly Cardiovascular:  RRR, no m/r/g. No LE edema.  Respiratory:  Mild diffuse wheezing with reasonable air movement Mildly increased respiratory effort. Abdomen:  soft, ntnd, NABS Skin:  no rash or induration seen on limited exam Musculoskeletal:  grossly normal tone BUE/BLE, good ROM, no bony abnormality Psychiatric:  grossly normal mood and affect, speech fluent and appropriate, AOx3 Neurologic:  CN 2-12 grossly intact, moves all extremities in coordinated fashion, sensation intact  Labs on Admission: I have personally reviewed following labs and imaging studies  CBC:  Recent Labs Lab 07/16/16 1351 07/21/16 1300  WBC 12.8* 10.4  NEUTROABS 8.6* 6.4  HGB 11.8* 11.7*  HCT 38.4 37.9  MCV 95.0 95.0  PLT 254 742   Basic Metabolic Panel:  Recent Labs Lab 07/16/16 1351 07/21/16 1300  NA 140 140  K 2.8* 3.8  CL 99* 106  CO2 29 25  GLUCOSE 105* 145*  BUN 20 22*  CREATININE 1.02* 1.07*  CALCIUM 8.8* 9.1   GFR: Estimated Creatinine Clearance: 41.3 mL/min (A) (by C-G formula based on SCr of 1.07 mg/dL (H)). Liver Function Tests:  Recent Labs Lab 07/16/16 1351 07/21/16 1300  AST 21 17  ALT 16 12*  ALKPHOS 85 84  BILITOT 0.6 0.6  PROT 7.1 6.9  ALBUMIN 3.5 3.4*   No results for input(s): LIPASE, AMYLASE in the last 168 hours. No results for input(s): AMMONIA in the last 168 hours. Coagulation Profile: No results for input(s): INR, PROTIME in the last 168  hours. Cardiac Enzymes: No results for input(s): CKTOTAL, CKMB, CKMBINDEX, TROPONINI in the last 168 hours. BNP (last 3 results) No results for input(s): PROBNP in the last 8760 hours. HbA1C: No results for  input(s): HGBA1C in the last 72 hours. CBG: No results for input(s): GLUCAP in the last 168 hours. Lipid Profile: No results for input(s): CHOL, HDL, LDLCALC, TRIG, CHOLHDL, LDLDIRECT in the last 72 hours. Thyroid Function Tests: No results for input(s): TSH, T4TOTAL, FREET4, T3FREE, THYROIDAB in the last 72 hours. Anemia Panel: No results for input(s): VITAMINB12, FOLATE, FERRITIN, TIBC, IRON, RETICCTPCT in the last 72 hours. Urine analysis:    Component Value Date/Time   COLORURINE YELLOW 06/03/2016 2100   APPEARANCEUR CLEAR 06/03/2016 2100   LABSPEC 1.025 06/03/2016 2100   PHURINE 5.0 06/03/2016 2100   GLUCOSEU 50 (A) 06/03/2016 2100   HGBUR NEGATIVE 06/03/2016 2100   [place] NEGATIVE 06/03/2016 2100   [place] NEGATIVE 06/03/2016 2100   PROTEINUR NEGATIVE 06/03/2016 2100   NITRITE NEGATIVE 06/03/2016 2100   LEUKOCYTESUR NEGATIVE 06/03/2016 2100    Creatinine Clearance: Estimated Creatinine Clearance: 41.3 mL/min (A) (by C-G formula based on SCr of 1.07 mg/dL (H)).  Sepsis Labs: @LABRCNTIP (procalcitonin:4,lacticidven:4) ) Recent Results (from the past 240 hour(s))  Blood culture (routine x 2)     Status: None (Preliminary result)   Collection Time: 07/21/16  1:47 PM  Result Value Ref Range Status   Specimen Description BLOOD LEFT FOREARM  Final   Special Requests   Final    BOTTLES DRAWN AEROBIC AND ANAEROBIC Blood Culture adequate volume   Culture PENDING  Incomplete   Report Status PENDING  Incomplete  Blood culture (routine x 2)     Status: None (Preliminary result)   Collection Time: 07/21/16  1:55 PM  Result Value Ref Range Status   Specimen Description BLOOD RIGHT WRIST  Final   Special Requests   Final    BOTTLES DRAWN AEROBIC ONLY Blood Culture results may not be optimal due to an inadequate volume of blood received in culture bottles   Culture PENDING  Incomplete   Report Status PENDING  Incomplete     Radiological Exams on Admission: Dg Chest Port  1 View  Result Date: 07/21/2016 CLINICAL DATA:  Cough, shortness of breath, pancreatic cancer EXAM: PORTABLE CHEST 1 VIEW COMPARISON:  06/08/2016 FINDINGS: Lungs are clear. Right apical pleural-parenchymal scarring. No pleural effusion or pneumothorax. The heart is normal in size. Right chest port terminates at the cavoatrial junction. IMPRESSION: No evidence of acute cardiopulmonary disease. Electronically Signed   By: Julian Hy M.D.   On: 07/21/2016 15:34    EKG: Independently reviewed.  Afib with rate 137; IVCD, LVH, nonspecific ST changes with no evidence of acute ischemia  Assessment/Plan Principal Problem:   Acute on chronic respiratory failure with hypoxia (HCC) Active Problems:   Atrial fibrillation (HCC)   Pancreatic cancer (HCC)   Anemia of chronic disease   COPD exacerbation (HCC)   CKD (chronic kidney disease) stage 3, GFR 30-59 ml/min    Acute on chronic respiratory failure related to COPD exacerbation -Patient recently discharged (5/4) for COPD exac with HCAP with recurrent admission 5/6-8 who presents with recurrent symptoms -Significant wheezing, SOB on presentation; improved by the time I saw her today -This is her 7th hospitalization for this since January -She remains on nebs/home O2 from the last hospitalization but there is a question of compliance -There is also a question of how much support and care she is actually receiving at  home -Previously discharged to SNF but now back home again -She may be continuing to smoke (reports that she quit in November but inconsistent reporting about this) -She continues to go outside on days with very high heat index - this has led to her last 2 admissions -She may also benefit from admission into the [place] if she qualifies, to try to decrease her ER and hospital utilization -From a COPD perspective, her exacerbation does seem mild at this time -will admit patient to Med Surg  -Nebulizers: scheduled Duoneb  and prn albuterol -Solu-Medrol 60 mg IV BID  -Start PO Doxycycline 100 mg PO BID -Continue home O2 at 2L -Continue Dulera 2 Puff BID  A Fib -Rate controlled on Cardizem 240 mg po Daily and Metoprolol 50 mg po BID.  -Continue anticoagulation with Xarelto 15 mg po daily  Anemia of chronic disease -Hgb 11.7, stable -Continue to follow  Pancreatic Cancer -CA 19-9 was 194 on diagnosis in 10/17; it hit a nadir of 33 in 2/18 but is back to 90 as of 4/12, 98 on 6/13 -Undergoing Chemotherapy but discontinued 4/12 given multiple hospitalizations -Restaging MRI abdomen ordered 6/13 (scheduled for 6/28) - if stable, plan is for referral back to Dr. Barry Dienes for surgical intervention.  If progression of disease is noted will resume chemotherapy. -Could consider getting MRI as an inpatient once breathing is more stable.  CKD -BUN 22/Creatinine 1/07/GFR 52, chronic and stable    DVT prophylaxis: Xarelto Code Status:  DNR - confirmed with patient.  After discussion, she agrees that she would not want resuscitation at this time. Family Communication: None present Disposition Plan: To be determined Consults called: None Admission status: Admit - It is my clinical opinion that admission to INPATIENT is reasonable and necessary because this patient will require at least 2 midnights in the hospital to treat this condition based on the medical complexity of the problems presented.  Given the aforementioned information, the predictability of an adverse outcome is felt to be significant.     Karmen Bongo MD [facility]  If 7PM-7AM, please contact night-coverage [email or web address] Password [email or web address]  07/21/2016, 11:18 PM

## 2016-07-21 NOTE — ED Provider Notes (Signed)
[facility] DEPT Provider Note   CSN: [id number] Arrival date & time: 07/21/16  1245     History   Chief Complaint Chief Complaint  Patient presents with  . Cough    HPI Lindsay Salazar is a 66 y.o. female.  HPI  66 year old female history of COPD, chronic respiratory failure, pancreatic cancer, A. fib presents today with dyspnea and cough production that began today. She states he was okay when she woke up this morning. She has begun having increased cough productive of white sputum today. She describes this as feeling like previous episodes of bronchitis. She has not noted fever or chills. Like she is fairly weak at baseline. She has been hospitalized and discharged to a nursing home but has returned home from the nursing home. Is in unclear to me from speaking with her if she is able to walk. She states she has been having difficulty getting to the bathroom. She states that she is living at home with her significant other. She denies any chest pain, abdominal pain, nausea, vomiting or diarrhea. She states she has been taking her medications including her albuterol twice a day. She denies increasing her albuterol with the increased dyspnea.  Past Medical History:  Diagnosis Date  . Anemia of chronic disease   . Arthritis    bursitis , fibromyalgia  . Arthritis    "right hip" (11/27/2015)  . Asthma   . Chronic bronchitis ([place])   . Chronic respiratory failure ([place])   . Chronic right hip pain   . COPD (chronic obstructive pulmonary disease) ([place])    wears home o2 prn  . Daily headache    "last 2-3 months" (11/27/2015)  . DVT (deep venous thrombosis) ([place]) 03/2016  . Exposure to TB    "mom had it when she was pregnant with me"  . Fibromyalgia   . GERD (gastroesophageal reflux disease)    use to have it but not now  . Heart murmur    41-  56 years old  . Hyperkalemia   . Hypertension   . On home oxygen therapy    11/27/2015 "whenever I need it; don't know how many liters"  .  PAF (paroxysmal atrial fibrillation) ([place])   . Pancreatic cancer ([place])   . Paroxysmal atrial flutter ([place])   . Pneumonia    "several times" (11/27/2015)  . Pneumothorax     Patient Active Problem List   Diagnosis Date Noted  . Malnutrition of moderate degree 06/04/2016  . Cellulitis of left foot 05/26/2016  . COPD exacerbation ([place]) 05/09/2016  . Cellulitis 04/26/2016  . Chronic respiratory failure with hypoxia ([place]) 04/26/2016  . Hypoxemia 03/30/2016  . Acute on chronic respiratory failure with hypoxia ([place]) 03/29/2016  . Leukocytosis 03/29/2016  . Anemia of chronic disease 03/29/2016  . Essential hypertension 03/29/2016  . HCAP (healthcare-associated pneumonia) 03/28/2016  . B12 deficiency 01/24/2016  . COPD with acute exacerbation ([place]) 01/20/2016  . Pancreatic cancer ([place]) 12/07/2015  . Chronic anticoagulation-Xarelto 09/06/2015  . Gastrointestinal hemorrhage with melena 08/08/2015  . Atrial fibrillation ([place]) 07/17/2015    Past Surgical History:  Procedure Laterality Date  . ANTERIOR CERVICAL DECOMP/DISCECTOMY FUSION     "put 4 screws in"  . BACK SURGERY    . CARDIAC CATHETERIZATION  08/2004   Archie Endo 06/18/2010  . CARDIOVERSION N/A 07/18/2015   Procedure: CARDIOVERSION;  Surgeon: Josue Hector, MD;  Location: [facility];  Service: Cardiovascular;  Laterality: N/A;  . ESOPHAGOGASTRODUODENOSCOPY N/A 08/09/2015   Procedure:  ESOPHAGOGASTRODUODENOSCOPY (EGD);  Surgeon: Rogene Houston, MD;  Location: [facility] SUITE;  Service: Endoscopy;  Laterality: N/A;  . EUS N/A 11/29/2015   Procedure: UPPER ENDOSCOPIC ULTRASOUND (EUS) RADIAL;  Surgeon: Milus Banister, MD;  Location: [facility] ENDOS[facility];  Service: Endoscopy;  Laterality: N/A;  . IR GENERIC HISTORICAL  12/18/2015   IR US GUIDE VASC ACCESS RIGHT 12/18/2015 Sandi Mariscal, MD [facility]  . IR GENERIC HISTORICAL  12/18/2015   IR FLUORO GUIDE PORT INSERTION RIGHT 12/18/2015 Sandi Mariscal, MD [facility]  . TONSILLECTOMY    . TUBAL  LIGATION      OB History    Gravida Para Term Preterm AB Living   2         2   SAB TAB Ectopic Multiple Live Births                   Home Medications    Prior to Admission medications   Medication Sig Start Date End Date Taking? Authorizing Provider  COMBIVENT RESPIMAT 20-100 MCG/ACT AERS respimat Inhale 1 puff into the lungs 2 (two) times daily. 12/03/15   Rai, Ripudeep Raliegh Ip, MD  diltiazem (CARDIZEM CD) 240 MG 24 hr capsule Take 1 capsule (240 mg total) by mouth daily. 05/23/16 08/21/16  Lendon Colonel, NP  furosemide (LASIX) 40 MG tablet Take 1 tablet (40 mg total) by mouth daily. 06/06/16   Kathie Dike, MD  guaiFENesin (MUCINEX) 600 MG 12 hr tablet Take 2 tablets (1,200 mg total) by mouth 2 (two) times daily. 06/02/16   Raiford Noble Latif, DO  HYDROcodone-acetaminophen (NORCO) 10-325 MG tablet Take 1 tablet by mouth every 6 (six) hours as needed. 07/16/16   Baird Cancer, PA-C  ipratropium-albuterol (DUONEB) 0.5-2.5 (3) MG/3ML SOLN Inhale 3 mLs into the lungs every 6 (six) hours as needed (for shortness of breath).  03/03/16   [provider]  menthol-cetylpyridinium (CEPACOL) 3 MG lozenge Take 1 lozenge (3 mg total) by mouth as needed for sore throat. 06/02/16   Raiford Noble Latif, DO  metoprolol tartrate (LOPRESSOR) 50 MG tablet Take 1 tablet (50 mg total) by mouth 2 (two) times daily. 03/02/16   Kathie Dike, MD  OXYGEN Inhale 2 L into the lungs daily as needed (for breathing).    [provider]  PACLitaxel Protein-Bound Part (ABRAXANE IV) Inject into the vein. Day 1, day 8, day 15, every 28 days    [provider]  pantoprazole (PROTONIX) 40 MG tablet Take 1 tablet (40 mg total) by mouth daily. 12/03/15   Rai, Ripudeep K, MD  potassium chloride (K-DUR) 10 MEQ tablet Take 1 tablet (10 mEq total) by mouth daily. 06/06/16   Kathie Dike, MD  potassium chloride SA (K-DUR,KLOR-CON) 20 MEQ tablet Take 1 tablet (20 mEq total) by mouth 3 (three) times  daily. 07/17/16   Baird Cancer, PA-C  predniSONE (DELTASONE) 10 MG tablet Take 1 tablet (10 mg total) by mouth daily with breakfast. Take 6 tablets today and then decrease by 1 tablet daily until none are left. 06/10/16   Isaac Bliss, Rayford Halsted, MD  prochlorperazine (COMPAZINE) 10 MG tablet Take 1 tablet by mouth daily as needed for nausea/vomiting. 12/10/15   [provider]  Rivaroxaban (XARELTO) 15 MG TABS tablet Take 1 tablet (15 mg total) by mouth daily with supper. 05/01/16   Orson Eva, MD  SYMBICORT 160-4.5 MCG/ACT inhaler Inhale 1 puff into the lungs 2 (two) times daily. 12/03/15   Mendel Corning, MD  Family History Family History  Problem Relation Age of Onset  . COPD Mother   . Diabetes Father   . Stroke Father     Social History Social History  Substance Use Topics  . Smoking status: Former Smoker    Packs/day: 0.50    Years: 46.00    Types: Cigarettes    Start date: 10/09/1969    Quit date: 11/29/2015  . Smokeless tobacco: Never Used  . Alcohol use No     Comment: quit 30 years ago     Allergies   Ibuprofen; Other; Penicillins; Tiotropium bromide monohydrate; and Tramadol   Review of Systems Review of Systems   Physical Exam Updated Vital Signs BP 114/81 (BP Location: Left Arm)   Pulse (!) 136   Temp 97.7 F (36.5 C) (Oral)   Resp 20   Ht 1.626 m (5\' 4" )   Wt 55.8 kg (123 lb)   SpO2 98% Comment: pt reports wears 2liters @ home "all the time."  BMI 21.11 kg/m   Physical Exam  Constitutional: She is oriented to person, place, and time. She appears well-developed. She appears distressed.  Chronically ill-appearing female who appears dyspneic  HENT:  Head: Normocephalic and atraumatic.  Eyes: EOM are normal. Pupils are equal, round, and reactive to light.  Neck: Normal range of motion. Neck supple.  Cardiovascular: An irregularly irregular rhythm present.  Pulmonary/Chest: She is in respiratory distress.  Diffuse inspiratory and  expiratory wheezes  Abdominal: Soft. Bowel sounds are normal.  Musculoskeletal: Normal range of motion.  Neurological: She is alert and oriented to person, place, and time.  Skin: Skin is warm. Capillary refill takes less than 2 seconds.  Nursing note and vitals reviewed.    ED Treatments / Results  Labs (all labs ordered are listed, but only abnormal results are displayed) Labs Reviewed  CBC - Abnormal; Notable for the following:       Result Value   Hemoglobin 11.7 (*)    All other components within normal limits  CULTURE, BLOOD (ROUTINE X 2)  CULTURE, BLOOD (ROUTINE X 2)  BASIC METABOLIC PANEL  COMPREHENSIVE METABOLIC PANEL  CBC WITH DIFFERENTIAL/PLATELET  I-STAT CG4 LACTIC ACID, ED    EKG  EKG Interpretation  Date/Time:  Monday July 21 2016 12:56:23 EDT Ventricular Rate:  137 PR Interval:    QRS Duration: 120 QT Interval:  321 QTC Calculation: 478 R Axis:   -46 Text Interpretation:  Atrial fibrillation IVCD, consider atypical RBBB LVH with secondary repolarization abnormality Anterior Q waves, possibly due to LVH Confirmed by Pattricia Boss [phone]) on 07/21/2016 3:49:49 PM       Radiology Dg Chest Port 1 View  Result Date: 07/21/2016 CLINICAL DATA:  Cough, shortness of breath, pancreatic cancer EXAM: PORTABLE CHEST 1 VIEW COMPARISON:  06/08/2016 FINDINGS: Lungs are clear. Right apical pleural-parenchymal scarring. No pleural effusion or pneumothorax. The heart is normal in size. Right chest port terminates at the cavoatrial junction. IMPRESSION: No evidence of acute cardiopulmonary disease. Electronically Signed   By: Julian Hy M.D.   On: 07/21/2016 15:34    Procedures Procedures (including critical care time)  Medications Ordered in ED Medications  sodium chloride 0.9 % bolus 500 mL (not administered)  methylPREDNISolone sodium succinate (SOLU-MEDROL) 125 mg/2 mL injection 125 mg (not administered)  levofloxacin (LEVAQUIN) IVPB 500 mg (not administered)   albuterol (PROVENTIL) (2.5 MG/3ML) 0.083% nebulizer solution 5 mg (not administered)     Initial Impression / Assessment and Plan / ED Course  I  have reviewed the triage vital signs and the nursing notes.  Pertinent labs & imaging results that were available during my care of the patient were reviewed by me and considered in my medical decision making (see chart for details).     This is a 66 year old female with COPD, pancreatic cancer, on oxygen. Presents today with increased dyspnea and wheezing. She is febrile to 100.3. Coughing white sputum. She has diffuse historian expiratory wheezes. He is treated here with albuterol, Solu-Medrol, and Levaquin. She is somewhat improved. I have discussed intubation and she states that she does not want to be intubated. However, she states that she would want CPR if her heart were to stop. I discussed some goals of care with her to feel she would advocate from palliative care. Currently, she states that she has been told that they can try a new treatment for her pancreatic cancer. She states "it would be okay if God to me today" but then also states that she wants to be around for her grandchildren to go through college with the youngest being 38.  3:57 PM Patient improved but continues with wheezing but much improved.  Final Clinical Impressions(s) / ED Diagnoses   Final diagnoses:  COPD exacerbation ([place])    New Prescriptions New Prescriptions   No medications on file     Pattricia Boss, MD 07/21/16 [phone]

## 2016-07-21 NOTE — ED Notes (Signed)
Gave patient meal tray as requested and approved by Dr Jeanell Sparrow.

## 2016-07-21 NOTE — ED Notes (Signed)
Patient had bowel movement in brief. Cleaned patient and applied new brief. Patient tolerated well.

## 2016-07-21 NOTE — ED Notes (Signed)
Pt assisted on and off bedpan, voided without difficulty 

## 2016-07-21 NOTE — ED Triage Notes (Signed)
Pt reports increased shortness of breath since last night. Pt reports productive cough for last several days as well. Pt alert and oriented and able to speak full sentences. Pt reports intermittent left sided rib pain with inspiration. nad noted.

## 2016-07-21 NOTE — ED Notes (Signed)
Gave patient warm blanket and coffee as requested and approved by Dr Jeanell Sparrow.

## 2016-07-22 DIAGNOSIS — J441 Chronic obstructive pulmonary disease with (acute) exacerbation: Secondary | ICD-10-CM | POA: Diagnosis not present

## 2016-07-22 DIAGNOSIS — J9621 Acute and chronic respiratory failure with hypoxia: Secondary | ICD-10-CM | POA: Diagnosis not present

## 2016-07-22 LAB — CBC
HEMATOCRIT: 34.3 % — AB (ref 36.0–46.0)
HEMOGLOBIN: 10.6 g/dL — AB (ref 12.0–15.0)
MCH: 29.8 pg (ref 26.0–34.0)
MCHC: 30.9 g/dL (ref 30.0–36.0)
MCV: 96.3 fL (ref 78.0–100.0)
Platelets: 256 10*3/uL (ref 150–400)
RBC: 3.56 MIL/uL — ABNORMAL LOW (ref 3.87–5.11)
RDW: 15 % (ref 11.5–15.5)
WBC: 5.5 10*3/uL (ref 4.0–10.5)

## 2016-07-22 LAB — BASIC METABOLIC PANEL
Anion gap: 10 (ref 5–15)
BUN: 34 mg/dL — AB (ref 6–20)
CHLORIDE: 103 mmol/L (ref 101–111)
CO2: 23 mmol/L (ref 22–32)
Calcium: 8.6 mg/dL — ABNORMAL LOW (ref 8.9–10.3)
Creatinine, Ser: 1.68 mg/dL — ABNORMAL HIGH (ref 0.44–1.00)
GFR calc Af Amer: 35 mL/min — ABNORMAL LOW (ref >60)
GFR calc non Af Amer: 30 mL/min — ABNORMAL LOW (ref >60)
GLUCOSE: 293 mg/dL — AB (ref 65–99)
POTASSIUM: 4.7 mmol/L (ref 3.5–5.1)
Sodium: 136 mmol/L (ref 135–145)

## 2016-07-22 MED ORDER — DOCUSATE SODIUM 100 MG PO CAPS: 100.0000 mg | ORAL_CAPSULE | Freq: Two times a day (BID) | ORAL | 0 refills | Status: AC

## 2016-07-22 NOTE — Care Management Note (Signed)
Case Management Note  Patient Details  Name: Lindsay Salazar MRN: [id number] Date of Birth: [birth year]  Subjective/Objective:                  Pt admitted with COPD exacerbation. She is from home with her spouse and his brother. Pt recently discharged from [facility] where she stayed somewhat healthy while she was there, enough to not need hospitalization. Pt's spouse states she is only here because her oxygen was not working and the DME company took a week to come out to fix it, during which time pt went with no oxgyen. Pt mentions the reason the oxygen was messed up was due to the 'bugs getting in it'. CM emphasized the importance of cleaning the home, removing bugs and feces and NOT smoking in the home (as described by EMS, previous [facility] and pt's daughter, pt herself will admit to home environment previously). CM explained to spouse that an enviroment like that may be tolerable to him but not an end-stage COPD person like [name]. Pt's spouse continues to make excuses and states pt can not clean herself and he has to take breaks when working and cant get his nephew to help. Pt not currently agreeable to [facility] but feels she may be in the future, She is agreeable to OP PT, we discussed [facility] for transport, spouse says he can take her two days a week. CM explained the OP PT will work with them on scheduling needs when they call. No further needs communicated at this time.   Action/Plan: Pt discharging home today with self care and OP PT referral.   Expected Discharge Date:  07/22/16               Expected Discharge Plan:  Home/Self Care  In-House Referral:  NA  Discharge planning Services  CM Consult  Post Acute Care Choice:  NA Choice offered to:  NA  Status of Service:  Completed, signed off  Sherald Barge, RN 07/22/2016, 1:50 PM

## 2016-07-22 NOTE — Evaluation (Signed)
Physical Therapy Evaluation Patient Details Name: JAMIESON LISA MRN: [id number] DOB: [birth year] Today's Date: 07/22/2016   History of Present Illness  Rosalynn Sergent is a 66yo white female who comes to [facility] after feeling poorly on 6/18 after sitting outside on the porch in her WC. She has been home from STR for about 3 weeks, and was otherwise feeling strong up until this happened. PMH: COPD, pancreatic CA, AF, PNA, fibromyalgia.   Clinical Impression  Pt admitted with above diagnosis. Pt currently with functional limitations due to the deficits listed below (see "PT Problem List"). Upon entry, the patient is received semirecumbent in bed, no family/caregiver present.The pt is awake and agreeable to participate. No acute distress noted at this time. Pt in AF with assumed RVR per oximeter, however not on tele and unable to palpate pulse manually. The pt is alert and oriented x3, pleasant, conversational, and following simple and multi-step commands consistently. Pt received on and remaining on 2L O2 throughout evaluation, with noted saturation of >96%.Functional mobility assessment demonstrates moderate weakness, the pt now requiring Min-Mod-assist physical assistance for transfers, and minA for stability during gait, however, the patient reports this to be improved since previous admission. Pt will benefit from skilled PT intervention to increase independence and safety with basic mobility in preparation for discharge to the venue listed below.       Follow Up Recommendations Outpatient PT;Supervision for mobility/OOB (hx of tranportation issues, difficulty getting gas money; educated on [employer] transit and given a hand-out with contact info. Pt has refused HHPT in the past due to large, aggressive dogs. )    Equipment Recommendations       Recommendations for Other Services       Precautions / Restrictions Precautions Precautions: Fall Restrictions Weight Bearing Restrictions: No       Mobility  Bed Mobility Overal bed mobility: Modified Independent                Transfers Overall transfer level: Needs assistance Equipment used: 1 person hand held assist Transfers: Sit to/from Stand Sit to Stand: Min assist            Ambulation/Gait Ambulation/Gait assistance: Min assist (for stability) Ambulation Distance (Feet): 24 Feet Assistive device: 1 person hand held assist Gait Pattern/deviations: Decreased step length - right;Decreased step length - left;Leaning posteriorly;Wide base of support   Gait velocity interpretation: <1.8 ft/sec, indicative of risk for recurrent falls General Gait Details: HR: 140-150s in AF after short distance.   Stairs            Wheelchair Mobility    Modified Rankin (Stroke Patients Only)       Balance Overall balance assessment: History of Falls;Needs assistance         Standing balance support: During functional activity;Bilateral upper extremity supported Standing balance-Leahy Scale: Poor                               Pertinent Vitals/Pain Pain Assessment: No/denies pain    Home Living Family/patient expects to be discharged to:: Private residence Living Arrangements: Spouse/significant other Available Help at Discharge: Available PRN/intermittently Type of Home: House Home Access: Stairs to enter Entrance Stairs-Rails: None Entrance Stairs-Number of Steps: 3 Home Layout: One level Home Equipment: Clinical cytogeneticist - 2 wheels;Bedside commode;Wheelchair - manual      Prior Function Level of Independence: Needs assistance   Gait / Transfers Assistance Needed: Pt reports she is using  furniture for functional mobility at home, uses her wheelchair for when she goes out in community  ADL's / [place] Needed: Pt reports she is now independent in bathing and dressing (since d/c from [facility]), requires assistance for housekeeping and additional I/ADL tasks. Boyfriend  is available for assistance as needed.         Hand Dominance   Dominant Hand: Right    Extremity/Trunk Assessment                Communication   Communication: No difficulties  Cognition Arousal/Alertness: Awake/alert Behavior During Therapy: WFL for tasks assessed/performed Overall Cognitive Status: Within Functional Limits for tasks assessed                                        General Comments      Exercises     Assessment/Plan    PT Assessment Patient needs continued PT services  PT Problem List Decreased strength;Decreased activity tolerance;Decreased balance;Decreased mobility       PT Treatment Interventions Gait training;Stair training;Functional mobility training;Therapeutic activities;Therapeutic exercise;Balance training;Patient/family education    PT Goals (Current goals can be found in the Care Plan section)  Acute Rehab PT Goals Patient Stated Goal: go home, and remain strong  PT Goal Formulation: With patient Time For Goal Achievement: 08/05/16 Potential to Achieve Goals: Good    Frequency Min 2X/week   Barriers to discharge Inaccessible home environment no railing at egress     Co-evaluation               AM-PAC PT "6 Clicks" Daily Activity  Outcome Measure Difficulty turning over in bed (including adjusting bedclothes, sheets and blankets)?: A Little Difficulty moving from lying on back to sitting on the side of the bed? : A Little Difficulty sitting down on and standing up from a chair with arms (e.g., wheelchair, bedside commode, etc,.)?: Total Help needed moving to and from a bed to chair (including a wheelchair)?: Total Help needed walking in hospital room?: A Lot Help needed climbing 3-5 steps with a railing? : A Lot 6 Click Score: 12    End of Session Equipment Utilized During Treatment: Gait belt;Oxygen Activity Tolerance: Patient tolerated treatment well;Patient limited by fatigue;Treatment limited  secondary to medical complications (Comment) (Tachycardia and AF ) Patient left: in chair   PT Visit Diagnosis: Unsteadiness on feet (R26.81);History of falling (Z91.81);Difficulty in walking, not elsewhere classified (R26.2)    Time: 0923-3007 PT Time Calculation (min) (ACUTE ONLY): 21 min   Charges:   PT Evaluation $PT Eval Moderate Complexity: 1 Procedure PT Treatments $Therapeutic Activity: 8-22 mins   PT G Codes:        12:43 PM, 07-23-2016 Etta Grandchild, PT, DPT Physical Therapist - [place] [phone] (Office)      Buccola,Allan C 07-23-16, 12:36 PM

## 2016-07-22 NOTE — Progress Notes (Signed)
Inpatient Diabetes Program Recommendations  AACE/ADA: New Consensus Statement on Inpatient Glycemic Control (2015)  Target Ranges:  Prepandial:   less than 140 mg/dL      Peak postprandial:   less than 180 mg/dL (1-2 hours)      Critically ill patients:  140 - 180 mg/dL   Results for Lindsay Salazar, Lindsay Salazar (MRN [id number]) as of 07/22/2016 09:28  Ref. Range 07/21/2016 13:00 07/22/2016 05:04  Glucose Latest Ref Range: 65 - 99 mg/dL 145 (H) 293 (H)   Review of Glycemic Control  Diabetes history: No Outpatient Diabetes medications: NA Current orders for Inpatient glycemic control: None  Inpatient Diabetes Program Recommendations: Correction (SSI): While inpatient and ordered steroids, please consider ordering CBGs with Novolog correction scale ACHS.  Thanks, [name], RN, MSN, CDE Diabetes Coordinator Inpatient Diabetes Program [phone] (Team Pager from 8am to 5pm)

## 2016-07-22 NOTE — Discharge Summary (Signed)
Physician Discharge Summary  [facility] [place]:[id number] DOB: 09/04/1950 DOA: 07/21/2016  PCP: [name], PA-C  Admit date: 07/21/2016 Discharge date: 07/22/2016  Time spent: 45 minutes  Recommendations for Outpatient Follow-up:  -Will be discharged home today. -Advised to follow-up with primary care provider in 2 weeks.   Discharge Diagnoses:  Principal Problem:   Acute on chronic respiratory failure with hypoxia (HCC) Active Problems:   Atrial fibrillation (HCC)   Pancreatic cancer (HCC)   Anemia of chronic disease   COPD exacerbation (HCC)   CKD (chronic kidney disease) stage 3, GFR 30-59 ml/min   Discharge Condition: Stable and improved  Filed Weights   07/21/16 1246 07/21/16 1852  Weight: 55.8 kg (123 lb) 52 kg (114 lb 11.2 oz)    History of present illness:  As per Dr. Lorin Mercy on 6/18: Lindsay Salazar is a 66 y.o. female with medical history significant of chronic respiratory failure on 2 L oxygen, COPD, chronic atrial fibrillation, pancreatic cancer , with recent hospital stay, just discharged yesterday for images cellulitis, and COPD.  She was hospitalized twice in May for COPD/PNA and was discharged on 5/8 to [facility].  Patient has been home 3 weeks.  Patient was "rattling in my chest" when she left.  She has been taking her medications since discharge.  Boyfriend was concerned because her color wasn't good.  This AM, difficulty breathing upon waking up.  Used breathing treatments and took meds.  Reports she has not been out of her house but she has been outside sitting in her wheelchair on the front porch..  She reports that she stopped smoking when she was diagnosed with cancer, although on my last admission on 5/6 she reported that she was still smoking but less than prior.  About 11 am today she started "feeling funky."  Concerned it was her COPD acting up.  Her boyfriend was worried about her color and she told him she had been freezing to death all night long.   "Come to find out I had fever of 108."  She got more stuff done to her upon arrival in the ER "than I ever seen in my life, they done good up here."  Currently feeling a little winded and sleepy.  Wears 2L at home, on 2L here.    +cough productive of milky sputum.  Pooped well in ER for the first time in 2 weeks.   ED Course:  COPD-E, given Albuterol, Solumedrol, and Levaquin with some improvement.   Hospital Course:   Acute on chronic hypoxemic respiratory failure due to COPD exacerbation -Well-known to [place] for frequent admissions for the same. -On exam this morning she is sitting in the chair, is having no difficulty speaking in full sentences, lungs are clear to auscultation without wheezes. -I suspect the main reason for her frequent hospitalizations is anxiety, likely surrounding her diagnosis of pancreatic cancer. She was recently in a skilled nursing facility but was readmitted soon after discharge. She states that she when she goes outside in the hot weather her "breathing flares up". -Do not believe she needs to go home with a prescription for steroids or antibiotics at this time, advised to continue her scheduled inhalers and nebulizers.  Atrial fibrillation -Rate controlled on Cardizem and metoprolol. -Anticoagulated on Xarelto.  Pancreatic cancer -Workup for this and chemotherapy has been difficult to coordinate surrounding her multiple hospitalizations. -She is told to return back to her oncologist in the outpatient setting.  Procedures:  None  Consultations:  None  Discharge Instructions  Discharge Instructions    Ambulatory referral to Physical Therapy    Complete by:  As directed    Increase activity slowly    Complete by:  As directed      Allergies as of 07/22/2016      Reactions   Ibuprofen Nausea And Vomiting   Other    Seafood   Penicillins Swelling   Has patient had a PCN reaction causing immediate rash, facial/tongue/throat swelling, SOB or  lightheadedness with hypotension: Yes Has patient had a PCN reaction causing severe rash involving mucus membranes or skin necrosis: No Has patient had a PCN reaction that required hospitalization No Has patient had a PCN reaction occurring within the last 10 years: No If all of the above answers are "NO", then may proceed with Cephalosporin use.   Tiotropium Bromide Monohydrate Itching   Tramadol Nausea And Vomiting      Medication List    TAKE these medications   COMBIVENT RESPIMAT 20-100 MCG/ACT Aers respimat Generic drug:  Ipratropium-Albuterol Inhale 1 puff into the lungs 2 (two) times daily.   ipratropium-albuterol 0.5-2.5 (3) MG/3ML Soln Commonly known as:  DUONEB Inhale 3 mLs into the lungs every 6 (six) hours as needed (for shortness of breath).   diltiazem 240 MG 24 hr capsule Commonly known as:  CARDIZEM CD Take 1 capsule (240 mg total) by mouth daily.   docusate sodium 100 MG capsule Commonly known as:  COLACE Take 1 capsule (100 mg total) by mouth 2 (two) times daily.   furosemide 40 MG tablet Commonly known as:  LASIX Take 1 tablet (40 mg total) by mouth daily.   HYDROcodone-acetaminophen 10-325 MG tablet Commonly known as:  NORCO Take 1 tablet by mouth every 6 (six) hours as needed.   LORazepam 0.5 MG tablet Commonly known as:  ATIVAN Take 0.5 mg by mouth every 6 (six) hours as needed for anxiety.   metoprolol tartrate 50 MG tablet Commonly known as:  LOPRESSOR Take 1 tablet (50 mg total) by mouth 2 (two) times daily.   OXYGEN Inhale 2 L into the lungs daily as needed (for breathing).   pantoprazole 40 MG tablet Commonly known as:  PROTONIX Take 1 tablet (40 mg total) by mouth daily.   potassium chloride SA 20 MEQ tablet Commonly known as:  K-DUR,KLOR-CON Take 1 tablet (20 mEq total) by mouth 3 (three) times daily.   Rivaroxaban 15 MG Tabs tablet Commonly known as:  XARELTO Take 1 tablet (15 mg total) by mouth daily with supper.   SYMBICORT  160-4.5 MCG/ACT inhaler Generic drug:  budesonide-formoterol Inhale 1 puff into the lungs 2 (two) times daily.   temazepam 15 MG capsule Commonly known as:  RESTORIL Take 15 mg by mouth at bedtime.      Allergies  Allergen Reactions  . Ibuprofen Nausea And Vomiting  . Other     Seafood   . Penicillins Swelling    Has patient had a PCN reaction causing immediate rash, facial/tongue/throat swelling, SOB or lightheadedness with hypotension: Yes Has patient had a PCN reaction causing severe rash involving mucus membranes or skin necrosis: No Has patient had a PCN reaction that required hospitalization No Has patient had a PCN reaction occurring within the last 10 years: No If all of the above answers are "NO", then may proceed with Cephalosporin use.   . Tiotropium Bromide Monohydrate Itching  . Tramadol Nausea And Vomiting   Follow-up Information    Neysa Hotter  B, PA-C. Schedule an appointment as soon as possible for a visit in 2 week(s).   Specialty:  Physician Assistant Contact information: [address] [place] [address] [place] [address] [phone]            The results of significant diagnostics from this hospitalization (including imaging, microbiology, ancillary and laboratory) are listed below for reference.    Significant Diagnostic Studies: Dg Chest Port 1 View  Result Date: 07/21/2016 CLINICAL DATA:  Cough, shortness of breath, pancreatic cancer EXAM: PORTABLE CHEST 1 VIEW COMPARISON:  06/08/2016 FINDINGS: Lungs are clear. Right apical pleural-parenchymal scarring. No pleural effusion or pneumothorax. The heart is normal in size. Right chest port terminates at the cavoatrial junction. IMPRESSION: No evidence of acute cardiopulmonary disease. Electronically Signed   By: Julian Hy M.D.   On: 07/21/2016 15:34    Microbiology: Recent Results (from the past 240 hour(s))  Blood culture (routine x 2)     Status: None (Preliminary result)   Collection Time:  07/21/16  1:47 PM  Result Value Ref Range Status   Specimen Description BLOOD LEFT FOREARM  Final   Special Requests   Final    BOTTLES DRAWN AEROBIC AND ANAEROBIC Blood Culture adequate volume   Culture NO GROWTH < 24 HOURS  Final   Report Status PENDING  Incomplete  Blood culture (routine x 2)     Status: None (Preliminary result)   Collection Time: 07/21/16  1:55 PM  Result Value Ref Range Status   Specimen Description BLOOD RIGHT WRIST  Final   Special Requests   Final    BOTTLES DRAWN AEROBIC ONLY Blood Culture results may not be optimal due to an inadequate volume of blood received in culture bottles   Culture NO GROWTH < 24 HOURS  Final   Report Status PENDING  Incomplete     Labs: Basic Metabolic Panel:  Recent Labs Lab 07/16/16 1351 07/21/16 1300 07/22/16 0504  NA 140 140 136  K 2.8* 3.8 4.7  CL 99* 106 103  CO2 29 25 23   GLUCOSE 105* 145* 293*  BUN 20 22* 34*  CREATININE 1.02* 1.07* 1.68*  CALCIUM 8.8* 9.1 8.6*   Liver Function Tests:  Recent Labs Lab 07/16/16 1351 07/21/16 1300  AST 21 17  ALT 16 12*  ALKPHOS 85 84  BILITOT 0.6 0.6  PROT 7.1 6.9  ALBUMIN 3.5 3.4*   No results for input(s): LIPASE, AMYLASE in the last 168 hours. No results for input(s): AMMONIA in the last 168 hours. CBC:  Recent Labs Lab 07/16/16 1351 07/21/16 1300 07/22/16 0504  WBC 12.8* 10.4 5.5  NEUTROABS 8.6* 6.4  --   HGB 11.8* 11.7* 10.6*  HCT 38.4 37.9 34.3*  MCV 95.0 95.0 96.3  PLT 254 260 256   Cardiac Enzymes: No results for input(s): CKTOTAL, CKMB, CKMBINDEX, TROPONINI in the last 168 hours. BNP: BNP (last 3 results)  Recent Labs  05/08/16 1645 06/03/16 0909 06/08/16 1140  BNP 711.0* 907.0* 876.0*    ProBNP (last 3 results) No results for input(s): PROBNP in the last 8760 hours.  CBG: No results for input(s): GLUCAP in the last 168 hours.     SignedLelon Frohlich  [facility] Pager: [phone] 07/22/2016, 3:30 PM

## 2016-07-22 NOTE — Care Management CC44 (Signed)
Condition Code 44 Documentation Completed  Patient Details  Name: MAYBREE RILING MRN: [id number] Date of Birth: 11/18/1950   Condition Code 44 given:  Yes Patient signature on Condition Code 44 notice:  Yes Documentation of 2 MD's agreement:  Yes Code 44 added to claim:  Yes    Sherald Barge, RN 07/22/2016, 1:59 PM

## 2016-07-22 NOTE — Evaluation (Signed)
Occupational Therapy Evaluation Patient Details Name: Lindsay Salazar MRN: [id number] DOB: [birth year] Today's Date: 07/22/2016    History of Present Illness Lindsay Salazar is a 66 y.o. female with medical history significant of chronic respiratory failure on 2 L oxygen, COPD, chronic atrial fibrillation, pancreatic cancer , with recent hospital stay, just discharged yesterday for images cellulitis, and COPD.  She was hospitalized twice in May for COPD/PNA and was discharged on 5/8 to [facility].  Patient has been home 3 weeks.  Patient was "rattling in my chest" when she left.  She has been taking her medications since discharge.  Boyfriend was concerned because her color wasn't good.  This AM, difficulty breathing upon waking up.  Used breathing treatments and took meds.  Reports she has not been out of her house but she has been outside sitting in her wheelchair on the front porch..  She reports that she stopped smoking when she was diagnosed with cancer, although on my last admission on 5/6 she reported that she was still smoking but less than prior.  About 11 am today she started "feeling funky."  Concerned it was her COPD acting up.  Her boyfriend was worried about her color and she told him she had been freezing to death all night long.  "Come to find out I had fever of 108."  She got more stuff done to her upon arrival in the ER "than I ever seen in my life, they done good up here."  Currently feeling a little winded and sleepy.  Wears 2L at home, on 2L here.    Clinical Impression   Pt received semi-reclined in bed, agreeable to OT evaluation. Pt reports she is feeling much improved since yesterday, reports she has been up and going to the bathroom overnight with no difficulties. During evaluation pt demonstrates independence in ADL completion, supervision for safety with standing tasks. Pt reports she has been using a SPC at home, occasionally a walker. During evaluation held OTs hand for comfort, no  DME utilized, min guard for functional mobility. Pt also reports using furniture at home at times. Educated on energy conservation and taking rest breaks for fatigue and SOB, as well as wearing O2 as instructed at home. Pt appears to be at baseline functioning with ADL completion, no further OT services required at this time.     Follow Up Recommendations  No OT follow up;Supervision - Intermittent    Equipment Recommendations  None recommended by OT       Precautions / Restrictions Precautions Precautions: Fall Restrictions Weight Bearing Restrictions: No      Mobility Bed Mobility Overal bed mobility: Modified Independent                Transfers Overall transfer level: Modified independent Equipment used: None                      ADL either performed or assessed with clinical judgement   ADL Overall ADL's : Needs assistance/impaired Eating/Feeding: Modified independent;Bed level   Grooming: Wash/dry hands;Supervision/safety;Standing               Lower Body Dressing: Modified independent;Sitting/lateral leans   Toilet Transfer: Supervision/safety;Comfort height toilet   Toileting- Clothing Manipulation and Hygiene: Modified independent;Sit to/from stand       Functional mobility during ADLs: Min guard (1 person hand held assist-for pt comfort)       Vision Baseline Vision/History: No visual deficits Patient Visual Report: No change  from baseline Vision Assessment?: No apparent visual deficits            Pertinent Vitals/Pain Pain Assessment: No/denies pain     Hand Dominance Right   Extremity/Trunk Assessment Upper Extremity Assessment Upper Extremity Assessment: Generalized weakness   Lower Extremity Assessment Lower Extremity Assessment: Defer to PT evaluation       Communication Communication Communication: No difficulties   Cognition   Behavior During Therapy: WFL for tasks assessed/performed Overall Cognitive Status:  Within Functional Limits for tasks assessed                                                Home Living Family/patient expects to be discharged to:: Private residence Living Arrangements: Spouse/significant other (boyfriend and boyfriend's brother) Available Help at Discharge: Available PRN/intermittently Type of Home: House Home Access: Stairs to enter Technical brewer of Steps: 3 Entrance Stairs-Rails: None Home Layout: One level     Bathroom Shower/Tub: Teacher, early years/pre: Standard Bathroom Accessibility: Yes   Home Equipment: Clinical cytogeneticist - 2 wheels;Bedside commode;Wheelchair - manual          Prior Functioning/Environment Level of Independence: Needs assistance  Gait / Transfers Assistance Needed: Pt reports she is using a SPC or walker for functional mobility at home, uses her wheelchair for when she goes out in community ADL's / Homemaking Assistance Needed: Pt reports she is now independent in bathing and dressing (since d/c from [facility]), requires assistance for housekeeping and additional I/ADL tasks. Boyfriend is available for assistance as needed.             OT Problem List: Decreased strength;Decreased activity tolerance;Impaired balance (sitting and/or standing)       AM-PAC PT "6 Clicks" Daily Activity     Outcome Measure Help from another person eating meals?: None Help from another person taking care of personal grooming?: None Help from another person toileting, which includes using toliet, bedpan, or urinal?: None Help from another person bathing (including washing, rinsing, drying)?: A Little Help from another person to put on and taking off regular upper body clothing?: None Help from another person to put on and taking off regular lower body clothing?: None 6 Click Score: 23   End of Session Equipment Utilized During Treatment: Gait belt  Activity Tolerance: Patient tolerated treatment  well Patient left: in bed;with bed alarm set;with call bell/phone within reach  OT Visit Diagnosis: Muscle weakness (generalized) (M62.81)                Time: 5053-9767 OT Time Calculation (min): 31 min Charges:  OT General Charges $OT Visit: 1 Procedure OT Evaluation $OT Eval Low Complexity: 1 Procedure G-Codes: OT G-codes **NOT FOR INPATIENT CLASS** Functional Assessment Tool Used: AM-PAC 6 Clicks Daily Activity Functional Limitation: Self care Self Care Current Status (H4193): At least 1 percent but less than 20 percent impaired, limited or restricted Self Care Goal Status (X9024): At least 1 percent but less than 20 percent impaired, limited or restricted Self Care Discharge Status [phone]): At least 1 percent but less than 20 percent impaired, limited or restricted    Guadelupe Sabin, OTR/L  [phone] 07/22/2016, 8:47 AM

## 2016-07-22 NOTE — Plan of Care (Signed)
Problem: Safety: Goal: Ability to remain free from injury will improve Outcome: Progressing Pt High fall risk d/t multiple falls at home and last admission. Bed in lowest locked position, call bell within reach, bed alarm on. Pt adv to call if needing to get OOB to use bathroom. Pt verbalized understanding. Will continue to monitor pt

## 2016-07-24 ENCOUNTER — Encounter (HOSPITAL_COMMUNITY): Payer: Self-pay | Admitting: *Deleted

## 2016-07-24 ENCOUNTER — Other Ambulatory Visit: Payer: Self-pay

## 2016-07-24 ENCOUNTER — Emergency Department (HOSPITAL_COMMUNITY): Payer: Medicare HMO

## 2016-07-24 ENCOUNTER — Emergency Department (HOSPITAL_COMMUNITY)
Admission: EM | Admit: 2016-07-24 | Discharge: 2016-07-24 | Disposition: A | Discharge status: Home / Self Care | Payer: Medicare HMO | Attending: Emergency Medicine | Admitting: Emergency Medicine

## 2016-07-24 DIAGNOSIS — I129 Hypertensive chronic kidney disease with stage 1 through stage 4 chronic kidney disease, or unspecified chronic kidney disease: Secondary | ICD-10-CM | POA: Diagnosis not present

## 2016-07-24 DIAGNOSIS — Z87891 Personal history of nicotine dependence: Secondary | ICD-10-CM | POA: Diagnosis not present

## 2016-07-24 DIAGNOSIS — J441 Chronic obstructive pulmonary disease with (acute) exacerbation: Secondary | ICD-10-CM | POA: Insufficient documentation

## 2016-07-24 DIAGNOSIS — Z885 Allergy status to narcotic agent status: Secondary | ICD-10-CM | POA: Insufficient documentation

## 2016-07-24 DIAGNOSIS — Z79899 Other long term (current) drug therapy: Secondary | ICD-10-CM | POA: Insufficient documentation

## 2016-07-24 DIAGNOSIS — Z7901 Long term (current) use of anticoagulants: Secondary | ICD-10-CM | POA: Diagnosis not present

## 2016-07-24 DIAGNOSIS — N183 Chronic kidney disease, stage 3 (moderate): Secondary | ICD-10-CM | POA: Insufficient documentation

## 2016-07-24 DIAGNOSIS — R200 Anesthesia of skin: Secondary | ICD-10-CM | POA: Insufficient documentation

## 2016-07-24 DIAGNOSIS — C259 Malignant neoplasm of pancreas, unspecified: Secondary | ICD-10-CM | POA: Insufficient documentation

## 2016-07-24 DIAGNOSIS — J45909 Unspecified asthma, uncomplicated: Secondary | ICD-10-CM | POA: Diagnosis not present

## 2016-07-24 DIAGNOSIS — Z9981 Dependence on supplemental oxygen: Secondary | ICD-10-CM | POA: Insufficient documentation

## 2016-07-24 DIAGNOSIS — Z880 Allergy status to penicillin: Secondary | ICD-10-CM | POA: Insufficient documentation

## 2016-07-24 MED ORDER — HYDROCODONE-ACETAMINOPHEN 5-325 MG PO TABS: 1.0000 | ORAL_TABLET | ORAL | 0 refills | Status: DC | PRN

## 2016-07-24 MED ORDER — HYDROCODONE-ACETAMINOPHEN 5-325 MG PO TABS
1.0000 | ORAL_TABLET | Freq: Once | ORAL | Status: AC
  Administered 2016-07-24: 1 via ORAL
  Filled 2016-07-24: qty 1

## 2016-07-24 NOTE — ED Notes (Signed)
ED Provider at bedside. 

## 2016-07-24 NOTE — ED Provider Notes (Signed)
[facility] DEPT Provider Note   CSN: [id number] Arrival date & time: 07/24/16  1338     History   Chief Complaint Chief Complaint  Patient presents with  . Arm Pain    HPI Lindsay Salazar is a 66 y.o. female.  Left arm numbness earlier this morning, now gone. No confusion, dysarthria, facial asymmetry, arm or leg weakness. She has multiple medical problems including pancreatic cancer and COPD.  Severity of symptoms mild to moderate. Nothing makes symptoms better or worse      Past Medical History:  Diagnosis Date  . Anemia of chronic disease   . Arthritis    bursitis , fibromyalgia  . Arthritis    "right hip" (11/27/2015)  . Asthma   . Chronic bronchitis ([place])   . Chronic respiratory failure ([place])   . Chronic right hip pain   . COPD (chronic obstructive pulmonary disease) ([place])    wears home o2 prn  . Daily headache    "last 2-3 months" (11/27/2015)  . DVT (deep venous thrombosis) ([place]) 03/2016  . Exposure to TB    "mom had it when she was pregnant with me"  . Fibromyalgia   . GERD (gastroesophageal reflux disease)    use to have it but not now  . Heart murmur    65-  59 years old  . Hyperkalemia   . Hypertension   . On home oxygen therapy    11/27/2015 "whenever I need it; don't know how many liters"  . PAF (paroxysmal atrial fibrillation) ([place])   . Pancreatic cancer ([place])   . Paroxysmal atrial flutter ([place])   . Pneumonia    "several times" (11/27/2015)  . Pneumothorax     Patient Active Problem List   Diagnosis Date Noted  . CKD (chronic kidney disease) stage 3, GFR 30-59 ml/min 07/21/2016  . Malnutrition of moderate degree 06/04/2016  . Cellulitis of left foot 05/26/2016  . COPD exacerbation ([place]) 05/09/2016  . Cellulitis 04/26/2016  . Chronic respiratory failure with hypoxia ([place]) 04/26/2016  . Hypoxemia 03/30/2016  . Acute on chronic respiratory failure with hypoxia ([place]) 03/29/2016  . Leukocytosis 03/29/2016  . Anemia of chronic disease  03/29/2016  . Essential hypertension 03/29/2016  . HCAP (healthcare-associated pneumonia) 03/28/2016  . B12 deficiency 01/24/2016  . COPD with acute exacerbation ([place]) 01/20/2016  . Pancreatic cancer ([place]) 12/07/2015  . Chronic anticoagulation-Xarelto 09/06/2015  . Gastrointestinal hemorrhage with melena 08/08/2015  . Atrial fibrillation ([place]) 07/17/2015    Past Surgical History:  Procedure Laterality Date  . ANTERIOR CERVICAL DECOMP/DISCECTOMY FUSION     "put 4 screws in"  . BACK SURGERY    . CARDIAC CATHETERIZATION  08/2004   Archie Endo 06/18/2010  . CARDIOVERSION N/A 07/18/2015   Procedure: CARDIOVERSION;  Surgeon: Josue Hector, MD;  [facility];  Service: Cardiovascular;  Laterality: N/A;  . ESOPHAGOGASTRODUODENOSCOPY N/A 08/09/2015   Procedure: ESOPHAGOGASTRODUODENOSCOPY (EGD);  Surgeon: Rogene Houston, MD;  Location: [facility];  Service: Endoscopy;  Laterality: N/A;  . EUS N/A 11/29/2015   Procedure: UPPER ENDOSCOPIC ULTRASOUND (EUS) RADIAL;  Surgeon: Milus Banister, MD;  Location: [facility];  Service: Endoscopy;  Laterality: N/A;  . IR GENERIC HISTORICAL  12/18/2015   IR US GUIDE VASC ACCESS RIGHT 12/18/2015 Sandi Mariscal, MD [facility]  . IR GENERIC HISTORICAL  12/18/2015   IR FLUORO GUIDE PORT INSERTION RIGHT 12/18/2015 Sandi Mariscal, MD [facility]  . TONSILLECTOMY    . TUBAL LIGATION  OB History    Gravida Para Term Preterm AB Living   2         2   SAB TAB Ectopic Multiple Live Births                   Home Medications    Prior to Admission medications   Medication Sig Start Date End Date Taking? Authorizing Provider  COMBIVENT RESPIMAT 20-100 MCG/ACT AERS respimat Inhale 1 puff into the lungs 2 (two) times daily. 12/03/15   Rai, Ripudeep Raliegh Ip, MD  diltiazem (CARDIZEM CD) 240 MG 24 hr capsule Take 1 capsule (240 mg total) by mouth daily. 05/23/16 08/21/16  Lendon Colonel, NP  docusate sodium (COLACE) 100 MG capsule Take 1 capsule (100 mg  total) by mouth 2 (two) times daily. 07/22/16   Isaac Bliss, Rayford Halsted, MD  furosemide (LASIX) 40 MG tablet Take 1 tablet (40 mg total) by mouth daily. 06/06/16   Kathie Dike, MD  HYDROcodone-acetaminophen (NORCO/VICODIN) 5-325 MG tablet Take 1 tablet by mouth every 4 (four) hours as needed. 07/24/16   Nat Christen, MD  ipratropium-albuterol (DUONEB) 0.5-2.5 (3) MG/3ML SOLN Inhale 3 mLs into the lungs every 6 (six) hours as needed (for shortness of breath).  03/03/16   [provider]  LORazepam (ATIVAN) 0.5 MG tablet Take 0.5 mg by mouth every 6 (six) hours as needed for anxiety.    [provider]  metoprolol tartrate (LOPRESSOR) 50 MG tablet Take 1 tablet (50 mg total) by mouth 2 (two) times daily. 03/02/16   Kathie Dike, MD  OXYGEN Inhale 2 L into the lungs daily as needed (for breathing).    [provider]  pantoprazole (PROTONIX) 40 MG tablet Take 1 tablet (40 mg total) by mouth daily. 12/03/15   Rai, Ripudeep K, MD  potassium chloride SA (K-DUR,KLOR-CON) 20 MEQ tablet Take 1 tablet (20 mEq total) by mouth 3 (three) times daily. 07/17/16   Baird Cancer, PA-C  Rivaroxaban (XARELTO) 15 MG TABS tablet Take 1 tablet (15 mg total) by mouth daily with supper. 05/01/16   Orson Eva, MD  SYMBICORT 160-4.5 MCG/ACT inhaler Inhale 1 puff into the lungs 2 (two) times daily. 12/03/15   Rai, Ripudeep K, MD  temazepam (RESTORIL) 15 MG capsule Take 15 mg by mouth at bedtime.    [provider]    Family History Family History  Problem Relation Age of Onset  . COPD Mother   . Diabetes Father   . Stroke Father     Social History Social History  Substance Use Topics  . Smoking status: Former Smoker    Packs/day: 0.50    Years: 46.00    Types: Cigarettes    Start date: 10/09/1969    Quit date: 11/29/2015  . Smokeless tobacco: Never Used  . Alcohol use No     Comment: quit 30 years ago     Allergies   Ibuprofen; Other; Penicillins; Tiotropium bromide  monohydrate; and Tramadol   Review of Systems Review of Systems  All other systems reviewed and are negative.    Physical Exam Updated Vital Signs BP (!) 133/97   Pulse 98   Resp (!) 25   Ht 5\' 4"  (1.626 m)   Wt 55.8 kg (123 lb)   SpO2 100%   BMI 21.11 kg/m   Physical Exam  Constitutional: She is oriented to person, place, and time. She appears well-developed and well-nourished.  HENT:  Head: Normocephalic and atraumatic.  Eyes: Conjunctivae are  normal.  Neck: Neck supple.  Cardiovascular: Normal rate and regular rhythm.   Pulmonary/Chest: Effort normal and breath sounds normal.  Abdominal: Soft. Bowel sounds are normal.  Musculoskeletal: Normal range of motion.  Neurological: She is alert and oriented to person, place, and time.  Skin: Skin is warm and dry.  Psychiatric: She has a normal mood and affect. Her behavior is normal.  Nursing note and vitals reviewed.    ED Treatments / Results  Labs (all labs ordered are listed, but only abnormal results are displayed) Labs Reviewed - No data to display  EKG  EKG Interpretation None       Radiology No results found.  Procedures Procedures (including critical care time)  Medications Ordered in ED Medications  HYDROcodone-acetaminophen (NORCO/VICODIN) 5-325 MG per tablet 1 tablet (1 tablet Oral Given 07/24/16 1416)     Initial Impression / Assessment and Plan / ED Course  I have reviewed the triage vital signs and the nursing notes.  Pertinent labs & imaging results that were available during my care of the patient were reviewed by me and considered in my medical decision making (see chart for details).     Patient reported left arm numbness has disappeared. Will discharge. Patient has oncology follow-up. Discharge medications Percocet.  Final Clinical Impressions(s) / ED Diagnoses   Final diagnoses:  Left arm numbness    New Prescriptions New Prescriptions   HYDROCODONE-ACETAMINOPHEN  (NORCO/VICODIN) 5-325 MG TABLET    Take 1 tablet by mouth every 4 (four) hours as needed.     Nat Christen, MD 07/24/16 [phone]

## 2016-07-24 NOTE — ED Triage Notes (Signed)
Pt with left arm pain that radiates around to mid back

## 2016-07-24 NOTE — Discharge Instructions (Signed)
Medication for pain. Follow-up with your primary care doctor or oncologist.

## 2016-07-26 LAB — CULTURE, BLOOD (ROUTINE X 2)
CULTURE: NO GROWTH
Culture: NO GROWTH
Special Requests: ADEQUATE

## 2016-07-31 ENCOUNTER — Ambulatory Visit (HOSPITAL_COMMUNITY)
Admission: RE | Admit: 2016-07-31 | Discharge: 2016-07-31 | Disposition: A | Discharge status: Home / Self Care | Payer: Medicare HMO | Source: Ambulatory Visit | Attending: Adult Health | Admitting: Adult Health

## 2016-07-31 ENCOUNTER — Other Ambulatory Visit (HOSPITAL_COMMUNITY): Payer: Self-pay | Admitting: Adult Health

## 2016-07-31 ENCOUNTER — Ambulatory Visit (HOSPITAL_COMMUNITY)
Admission: RE | Admit: 2016-07-31 | Discharge: 2016-07-31 | Disposition: A | Discharge status: Home / Self Care | Payer: Medicare HMO | Source: Ambulatory Visit | Attending: Oncology | Admitting: Oncology

## 2016-07-31 ENCOUNTER — Encounter (HOSPITAL_COMMUNITY): Payer: Self-pay

## 2016-07-31 ENCOUNTER — Other Ambulatory Visit (HOSPITAL_COMMUNITY): Payer: Self-pay | Admitting: Physician Assistant

## 2016-07-31 DIAGNOSIS — C251 Malignant neoplasm of body of pancreas: Secondary | ICD-10-CM

## 2016-07-31 DIAGNOSIS — C257 Malignant neoplasm of other parts of pancreas: Secondary | ICD-10-CM | POA: Insufficient documentation

## 2016-07-31 DIAGNOSIS — I700 Atherosclerosis of aorta: Secondary | ICD-10-CM | POA: Diagnosis not present

## 2016-07-31 DIAGNOSIS — N200 Calculus of kidney: Secondary | ICD-10-CM | POA: Insufficient documentation

## 2016-07-31 DIAGNOSIS — Z1231 Encounter for screening mammogram for malignant neoplasm of breast: Secondary | ICD-10-CM

## 2016-07-31 MED ORDER — IOPAMIDOL (ISOVUE-300) INJECTION 61%
70.0000 mL | Freq: Once | INTRAVENOUS | Status: AC | PRN
  Administered 2016-07-31: 70 mL via INTRAVENOUS

## 2016-08-05 ENCOUNTER — Encounter (HOSPITAL_COMMUNITY): Payer: Medicare HMO

## 2016-08-05 ENCOUNTER — Encounter (HOSPITAL_COMMUNITY): Payer: Medicare HMO | Attending: Oncology | Admitting: Oncology

## 2016-08-05 VITALS — BP 102/76 | HR 71 | Temp 98.2°F | Resp 20 | Wt 119.5 lb

## 2016-08-05 DIAGNOSIS — E875 Hyperkalemia: Secondary | ICD-10-CM | POA: Insufficient documentation

## 2016-08-05 DIAGNOSIS — E876 Hypokalemia: Secondary | ICD-10-CM

## 2016-08-05 DIAGNOSIS — M797 Fibromyalgia: Secondary | ICD-10-CM | POA: Diagnosis not present

## 2016-08-05 DIAGNOSIS — Z833 Family history of diabetes mellitus: Secondary | ICD-10-CM | POA: Insufficient documentation

## 2016-08-05 DIAGNOSIS — I480 Paroxysmal atrial fibrillation: Secondary | ICD-10-CM | POA: Insufficient documentation

## 2016-08-05 DIAGNOSIS — Z823 Family history of stroke: Secondary | ICD-10-CM | POA: Insufficient documentation

## 2016-08-05 DIAGNOSIS — Z825 Family history of asthma and other chronic lower respiratory diseases: Secondary | ICD-10-CM | POA: Diagnosis not present

## 2016-08-05 DIAGNOSIS — I4892 Unspecified atrial flutter: Secondary | ICD-10-CM | POA: Insufficient documentation

## 2016-08-05 DIAGNOSIS — C257 Malignant neoplasm of other parts of pancreas: Secondary | ICD-10-CM

## 2016-08-05 DIAGNOSIS — C259 Malignant neoplasm of pancreas, unspecified: Secondary | ICD-10-CM | POA: Diagnosis not present

## 2016-08-05 DIAGNOSIS — Z9981 Dependence on supplemental oxygen: Secondary | ICD-10-CM | POA: Diagnosis not present

## 2016-08-05 DIAGNOSIS — Z8507 Personal history of malignant neoplasm of pancreas: Secondary | ICD-10-CM | POA: Insufficient documentation

## 2016-08-05 DIAGNOSIS — I10 Essential (primary) hypertension: Secondary | ICD-10-CM | POA: Diagnosis not present

## 2016-08-05 DIAGNOSIS — Z87891 Personal history of nicotine dependence: Secondary | ICD-10-CM | POA: Diagnosis not present

## 2016-08-05 DIAGNOSIS — Z9221 Personal history of antineoplastic chemotherapy: Secondary | ICD-10-CM | POA: Insufficient documentation

## 2016-08-05 DIAGNOSIS — C251 Malignant neoplasm of body of pancreas: Secondary | ICD-10-CM | POA: Diagnosis not present

## 2016-08-05 DIAGNOSIS — K219 Gastro-esophageal reflux disease without esophagitis: Secondary | ICD-10-CM | POA: Insufficient documentation

## 2016-08-05 DIAGNOSIS — Z9889 Other specified postprocedural states: Secondary | ICD-10-CM | POA: Diagnosis not present

## 2016-08-05 DIAGNOSIS — J441 Chronic obstructive pulmonary disease with (acute) exacerbation: Secondary | ICD-10-CM | POA: Insufficient documentation

## 2016-08-05 DIAGNOSIS — G8929 Other chronic pain: Secondary | ICD-10-CM | POA: Diagnosis not present

## 2016-08-05 LAB — COMPREHENSIVE METABOLIC PANEL
ALBUMIN: 3.2 g/dL — AB (ref 3.5–5.0)
ALT: 14 U/L (ref 14–54)
ANION GAP: 10 (ref 5–15)
AST: 17 U/L (ref 15–41)
Alkaline Phosphatase: 81 U/L (ref 38–126)
BUN: 30 mg/dL — ABNORMAL HIGH (ref 6–20)
CALCIUM: 8.5 mg/dL — AB (ref 8.9–10.3)
CO2: 22 mmol/L (ref 22–32)
Chloride: 101 mmol/L (ref 101–111)
Creatinine, Ser: 1.1 mg/dL — ABNORMAL HIGH (ref 0.44–1.00)
GFR calc Af Amer: 58 mL/min — ABNORMAL LOW (ref >60)
GFR calc non Af Amer: 50 mL/min — ABNORMAL LOW (ref >60)
GLUCOSE: 131 mg/dL — AB (ref 65–99)
Potassium: 3.7 mmol/L (ref 3.5–5.1)
SODIUM: 133 mmol/L — AB (ref 135–145)
Total Bilirubin: 0.6 mg/dL (ref 0.3–1.2)
Total Protein: 6.2 g/dL — ABNORMAL LOW (ref 6.5–8.1)

## 2016-08-05 LAB — CBC WITH DIFFERENTIAL/PLATELET
BASOS ABS: 0 10*3/uL (ref 0.0–0.1)
Basophils Relative: 0 %
EOS PCT: 1 %
Eosinophils Absolute: 0.1 10*3/uL (ref 0.0–0.7)
HEMATOCRIT: 34.8 % — AB (ref 36.0–46.0)
Hemoglobin: 10.9 g/dL — ABNORMAL LOW (ref 12.0–15.0)
LYMPHS ABS: 2.7 10*3/uL (ref 0.7–4.0)
LYMPHS PCT: 21 %
MCH: 29.8 pg (ref 26.0–34.0)
MCHC: 31.3 g/dL (ref 30.0–36.0)
MCV: 95.1 fL (ref 78.0–100.0)
MONO ABS: 0.8 10*3/uL (ref 0.1–1.0)
MONOS PCT: 7 %
NEUTROS ABS: 9 10*3/uL — AB (ref 1.7–7.7)
Neutrophils Relative %: 71 %
Platelets: 184 10*3/uL (ref 150–400)
RBC: 3.66 MIL/uL — ABNORMAL LOW (ref 3.87–5.11)
RDW: 15.5 % (ref 11.5–15.5)
WBC: 12.6 10*3/uL — ABNORMAL HIGH (ref 4.0–10.5)

## 2016-08-05 LAB — MAGNESIUM: Magnesium: 1.6 mg/dL — ABNORMAL LOW (ref 1.7–2.4)

## 2016-08-05 LAB — PHOSPHORUS: Phosphorus: 2.5 mg/dL (ref 2.5–4.6)

## 2016-08-05 NOTE — Progress Notes (Signed)
[name], PA-C [address] [place] [address]  No diagnosis found.  CURRENT THERAPY: Due to repeated hospitalizations and no-shows, last treatment was on 05/15/2016 with Gemcitabine/Abraxane  INTERVAL HISTORY: Lindsay Salazar 66 y.o. female returns for followup of adenocarcinoma of pancreas, 4.2 cm in the body of pancreas with main pancreatic duct obstruction involving porto-splenic vein confluence, S/P EUS by Dr. Ardis Hughs on 11/29/2015 with FNA demonstrating adenocarcinoma.  CA 19-9 elevated initiation of treatment at 197.  Started on gemcitabine/Abraxane beginning on 12/19/2015 and a day 1, 8, 15 every 28 day fashion.  Course complicated by multiple hospitalizations unrelated to her malignancy or treatment.    Pancreatic cancer ([place])   11/29/2015 Procedure    EUS by Dr. Ardis Hughs- 4.2cm mass in the body of pancreas causing main pancreatic duct obstruction (and dilation) and focally involving the portosplenic vein confluence. The mass does not involve the SMA, celiac trunk.      11/30/2015 Pathology Results    FINE NEEDLE ASPIRATION, ENDOSCOPIC, PANCREAS BODY (SPECIMEN 1 OF 1 COLLECTED 11/30/15): MALIGNANT CELLS CONSISTENT WITH ADENOCARCINOMA.      12/18/2015 Procedure    Successful placement of right IJ approach port-a-cath with tip at the superior caval atrial junction by IR      12/19/2015 -  Chemotherapy    The patient had PACLitaxel-protein bound (ABRAXANE) chemo infusion 200 mg, 125 mg/m2 = 200 mg, Intravenous,  Once, 1 of 4 cycles  gemcitabine (GEMZAR) 1,634 mg in sodium chloride 0.9 % 250 mL chemo infusion, 1,000 mg/m2 = 1,634 mg, Intravenous,  Once, 1 of 4 cycles  for chemotherapy treatment.        12/19/2015 Tumor Marker    Patient's tumor was tested for the following markers: CA 19-9. Results of the tumor marker test revealed 197.      12/24/2015 Imaging    CT angio chest- 1. No evidence of pulmonary embolus. 2. Mild scattered bilateral  peribronchovascular opacity within the lungs, more nodular at the right middle lobe, likely reflects an atypical infectious process.      01/20/2016 - 01/23/2016 Hospital Admission    Admit date: 01/20/2016 Admission diagnosis: COPD exacerbation Additional comments:       01/28/2016 - 02/07/2016 Hospital Admission    Admit date: 01/28/2016 Admission diagnosis: Atrial fibrillation with rapid ventricular response Additional comments: CHADS vasc score 3, likely secondary to hypoxia, On metoprolol and Cardizem, and on Xarelto for anticoagulation      02/17/2016 - 02/19/2016 Hospital Admission    Admit date: 02/17/2016 Admission diagnosis: .Bright red blood per rectum. Additional comments: Anoscopy performed.  Low volume hematochezia appears to be secondary to hemorrhoidal bleeding. Anoscopy in emergency room did not reveal evidence of gross bleeding. Nevertheless patient needs colonoscopy at some point as she has never undergone this exam in the past. Patient however is not agreeable to undergo colonoscopy during this admission but will consider at a later date.      03/19/2016 Miscellaneous    Patient seen by Dr. Barry Dienes for consideration of distal pancreatectomy.  She is arranged for patient to undergo clearance from a pulmonology and cardiology standpoint.  Once cleared, she will plan on taking patient to surgery.  In the interim, we will continue with neoadjuvant chemotherapy.      03/28/2016 - 04/04/2016 Hospital Admission    Admit date: 03/28/2016 Admission diagnosis: Acute on chronic respiratory failure, COPD exacerbation       04/26/2016 - 05/01/2016 Hospital Admission  Admit date: 04/26/16 Admission diagnosis: Right LE cellulitis       05/08/2016 - 05/12/2016 Hospital Admission    Admit date: 05/08/16 Admission diagnosis: COPD exacerbation        05/15/2016 Treatment Plan Change    Remainder of cycle #5 (days 8 & 15 of cycle) discontinued given multiple hospitalizations.  Proceed with  cycle #6 Gemzar/Abraxane today.       Patient presents today with her boyfriend for follow up. She has not received chemo since 05/16/14 when she was taking cycle 6 day 1 of gemzar/abraxane. She was hospitalized twice in May for COPD/PNA and was discharged on 06/10/16 to [facility] for rehab. She is home now. She states she has quit smoking and feels like she is breathing a lot better.    Review of Systems  Constitutional: Negative.  Negative for chills, fever and weight loss.  HENT: Negative.   Eyes: Negative.   Respiratory: Negative.  Negative for cough.   Cardiovascular: Negative.  Negative for chest pain.  Gastrointestinal: Negative.  Negative for blood in stool, constipation, diarrhea, melena, nausea and vomiting.  Genitourinary: Negative.   Musculoskeletal: Negative.  Negative for falls.  Skin: Negative.   Neurological: Negative.  Negative for weakness.  Endo/Heme/Allergies: Negative.   Psychiatric/Behavioral: Negative.     Past Medical History:  Diagnosis Date  . Anemia of chronic disease   . Arthritis    bursitis , fibromyalgia  . Arthritis    "right hip" (11/27/2015)  . Asthma   . Chronic bronchitis ([place])   . Chronic respiratory failure ([place])   . Chronic right hip pain   . COPD (chronic obstructive pulmonary disease) ([place])    wears home o2 prn  . Daily headache    "last 2-3 months" (11/27/2015)  . DVT (deep venous thrombosis) ([place]) 03/2016  . Exposure to TB    "mom had it when she was pregnant with me"  . Fibromyalgia   . GERD (gastroesophageal reflux disease)    use to have it but not now  . Heart murmur    66-  54 years old  . Hyperkalemia   . Hypertension   . On home oxygen therapy    11/27/2015 "whenever I need it; don't know how many liters"  . PAF (paroxysmal atrial fibrillation) ([place])   . Pancreatic cancer ([place])   . Paroxysmal atrial flutter ([place])   . Pneumonia    "several times" (11/27/2015)  . Pneumothorax     Past Surgical History:  Procedure  Laterality Date  . ANTERIOR CERVICAL DECOMP/DISCECTOMY FUSION     "put 4 screws in"  . BACK SURGERY    . CARDIAC CATHETERIZATION  08/2004   Archie Endo 06/18/2010  . CARDIOVERSION N/A 07/18/2015   Procedure: CARDIOVERSION;  Surgeon: Josue Hector, MD;  [facility];  Service: Cardiovascular;  Laterality: N/A;  . ESOPHAGOGASTRODUODENOSCOPY N/A 08/09/2015   Procedure: ESOPHAGOGASTRODUODENOSCOPY (EGD);  Surgeon: Rogene Houston, MD;  Location[facility];  Service: Endoscopy;  Laterality: N/A;  . EUS N/A 11/29/2015   Procedure: UPPER ENDOSCOPIC ULTRASOUND (EUS) RADIAL;  Surgeon: Milus Banister, MD;  [facility];  Service: Endoscopy;  Laterality: N/A;  . IR GENERIC HISTORICAL  12/18/2015   IR US GUIDE VASC ACCESS RIGHT 12/18/2015 Sandi Mariscal, MD [facility]  . IR GENERIC HISTORICAL  12/18/2015   IR FLUORO GUIDE PORT INSERTION RIGHT 12/18/2015 Sandi Mariscal, MD [facility]  . TONSILLECTOMY    . TUBAL LIGATION  Family History  Problem Relation Age of Onset  . COPD Mother   . Diabetes Father   . Stroke Father     Social History   Social History  . Marital status: Divorced    Spouse name: N/A  . Number of children: N/A  . Years of education: N/A   Occupational History  . retired    Social History Main Topics  . Smoking status: Former Smoker    Packs/day: 0.50    Years: 46.00    Types: Cigarettes    Start date: 10/09/1969    Quit date: 11/29/2015  . Smokeless tobacco: Never Used  . Alcohol use No     Comment: quit 30 years ago  . Drug use: No  . Sexual activity: Not Currently    Birth control/ protection: Surgical, Post-menopausal   Other Topics Concern  . Not on file   Social History Narrative  . No narrative on file     PHYSICAL EXAMINATION  ECOG PERFORMANCE STATUS: 2 - Symptomatic, <50% confined to bed  Vitals:   08/05/16 1459  BP: 102/76  Pulse: 71  Resp: 20  Temp: 98.2 F (36.8 C)    GENERAL:alert, no distress, cachectic, smiling  and in wheelchair, significantly malodorous, accompanied by her boyfriend, significant facial hair. SKIN: skin color, texture, turgor are normal, no rashes or significant lesions HEAD: Normocephalic, No masses, lesions, tenderness or abnormalities EYES: normal, EOMI, Conjunctiva are pink and non-injected EARS: External ears normal OROPHARYNX:lips, buccal mucosa, and tongue normal and mucous membranes are moist  NECK: supple, no adenopathy, trachea midline LYMPH:  no palpable lymphadenopathy BREAST:not examined LUNGS: positive findings: wheezing  diffusely HEART: regular rate & rhythm, no murmurs and no gallops ABDOMEN:abdomen soft and normal bowel sounds BACK: Back symmetric, no curvature. EXTREMITIES:less then 2 second capillary refill, no joint deformities, effusion, or inflammation, no edema, no skin discoloration, no cyanosis, multiple LE ecchymoses.  NEURO: alert & oriented x 3 with fluent speech, no focal motor/sensory deficits, in wheelchair.   LABORATORY DATA: CBC    Component Value Date/Time   WBC 12.6 (H) 08/05/2016 1444   RBC 3.66 (L) 08/05/2016 1444   HGB 10.9 (L) 08/05/2016 1444   HCT 34.8 (L) 08/05/2016 1444   PLT 184 08/05/2016 1444   MCV 95.1 08/05/2016 1444   MCH 29.8 08/05/2016 1444   MCHC 31.3 08/05/2016 1444   RDW 15.5 08/05/2016 1444   LYMPHSABS 2.7 08/05/2016 1444   MONOABS 0.8 08/05/2016 1444   EOSABS 0.1 08/05/2016 1444   BASOSABS 0.0 08/05/2016 1444      Chemistry      Component Value Date/Time   NA 133 (L) 08/05/2016 1444   K 3.7 08/05/2016 1444   CL 101 08/05/2016 1444   CO2 22 08/05/2016 1444   BUN 30 (H) 08/05/2016 1444   CREATININE 1.10 (H) 08/05/2016 1444      Component Value Date/Time   CALCIUM 8.5 (L) 08/05/2016 1444   ALKPHOS 81 08/05/2016 1444   AST 17 08/05/2016 1444   ALT 14 08/05/2016 1444   BILITOT 0.6 08/05/2016 1444       PENDING LABS:   RADIOGRAPHIC STUDIES:  Ct Abdomen W Wo Contrast  Result Date:  07/31/2016 CLINICAL DATA:  66 year old female with history of pancreatic cancer. Restaging examination. EXAM: CT ABDOMEN WITHOUT AND WITH CONTRAST TECHNIQUE: Multidetector CT imaging of the abdomen was performed following the standard protocol before and following the bolus administration of intravenous contrast. CONTRAST:  53mL ISOVUE-300 IOPAMIDOL (ISOVUE-300) INJECTION 61%  COMPARISON:  MRI of the abdomen 03/12/2016. CT of the abdomen and pelvis 11/27/2015. FINDINGS: Lower chest: Unremarkable. Hepatobiliary: Multiple tiny subcentimeter low-attenuation lesions are again noted scattered throughout the hepatic parenchyma, similar in size, number and distribution to prior examinations, most compatible with tiny cysts and/or biliary hamartomas. In addition, there is a 6.4 x 5.9 cm low-attenuation nonenhancing lesion centered predominantly in segment 3 of the liver, compatible with a large simple cyst. No new aggressive appearing hepatic lesions are noted. No intra or extrahepatic biliary ductal dilatation. Gallbladder is normal in appearance. Pancreas: Previously noted mass in the body of the pancreas is difficult to discretely visualize on today's examination. However, when assessed on arterial phase images using narrow window settings, the mass is estimated to measure approximately 1.9 x 3.0 cm (axial image 38 of series 3). There continues to be atrophy throughout the tail of the pancreas where there is also extensive ductal dilatation, similar to prior examinations. This mass comes in contact with the adjacent splenic vein, however, the vein is widely patent at this time. This mass comes in close proximity to the superior mesenteric vein and the splenoportal confluence, however, there appears to be an intervening fat plane between both of these vessels. The mass is well separated from the superior mesenteric artery. No peripancreatic inflammatory changes or fluid collections. Spleen: Unremarkable. Adrenals/Urinary  Tract: 5 mm nonobstructive calculus in the interpolar collecting system of the right kidney. 1.7 cm simple cyst in the lower pole of the right kidney. Left kidney is normal in appearance. No hydroureteronephrosis in the visualized abdomen. Bilateral adrenal glands are normal in appearance. Stomach/Bowel: Normal appearance of the stomach, although the previously described mass in the body of the pancreas slightly obscures the fat plane between the pancreas and the adjacent body of the stomach (axial image 28 of series 4 and sagittal image 63 of series 15). No pathologic dilatation of visualized portions of the small bowel or colon. Vascular/Lymphatic: Aortic atherosclerosis, without evidence of aneurysm or dissection in the abdominal vasculature. Vascular findings relevant to pancreatic carcinoma, as above. No lymphadenopathy noted in the abdomen. Other: Tiny umbilical hernia containing omental fat. No significant volume of ascites and no pneumoperitoneum noted in the visualized portions of the peritoneal cavity. Musculoskeletal: There are no aggressive appearing lytic or blastic lesions noted in the visualized portions of the skeleton. IMPRESSION: 1. Lesion in the body of the pancreas is not well demonstrated on today's CT examination, but appears similar in size to the most recent prior study, currently measuring approximately 3.0 x 1.9 cm. No definite evidence of metastatic disease in the abdomen. 2. Aortic atherosclerosis. 3. Nonobstructive 5 mm calculus in the interpolar collecting system of the right kidney. 4. Additional incidental findings, as above. Aortic Atherosclerosis (ICD10-I70.0). Electronically Signed   By: Vinnie Langton M.D.   On: 07/31/2016 15:40   Dg Chest Port 1 View  Result Date: 07/21/2016 CLINICAL DATA:  Cough, shortness of breath, pancreatic cancer EXAM: PORTABLE CHEST 1 VIEW COMPARISON:  06/08/2016 FINDINGS: Lungs are clear. Right apical pleural-parenchymal scarring. No pleural  effusion or pneumothorax. The heart is normal in size. Right chest port terminates at the cavoatrial junction. IMPRESSION: No evidence of acute cardiopulmonary disease. Electronically Signed   By: Julian Hy M.D.   On: 07/21/2016 15:34     PATHOLOGY:    ASSESSMENT AND PLAN:  Adenocarcinoma of pancreas, 4.2 cm in the body of pancreas with main pancreatic duct obstruction involving porto-splenic vein confluence, S/P EUS by Dr. Ardis Hughs  on 11/29/2015 with FNA demonstrating adenocarcinoma.  CA 19-9 elevated initiation of treatment at 197.  Started on gemcitabine/Abraxane beginning on 12/19/2015 and a day 1, 8, 15 every 28 day fashion.  Course complicated by multiple hospitalizations unrelated to her malignancy or treatment.  - We will hold further chemotherapy. She has not had treatment for almost 3 months now due to her multiple hospitalizations and then being in rehab.  - I have reviewed her repeat CT abd from 07/31/16 in detail with her and her boyfriend. Her pancreatic mass has remained stable in size and measures 3 x 1.9 cm without any invasion into the surrounding blood vessels or any evidence of new metastatic disease. I will refer her back stat to Dr. Barry Dienes for surgical evaluation since that is her best chance for cure.  - RTC in 4 weeks for follow up.   All questions were answered. The patient knows to call the clinic with any problems, questions or concerns. We can certainly see the patient much sooner if necessary.    This note is electronically signed by: Twana First, MD 08/05/2016 4:04 PM

## 2016-08-06 LAB — VITAMIN D 25 HYDROXY (VIT D DEFICIENCY, FRACTURES): VIT D 25 HYDROXY: 9.9 ng/mL — AB (ref 30.0–100.0)

## 2016-08-07 ENCOUNTER — Other Ambulatory Visit (HOSPITAL_COMMUNITY): Payer: Self-pay | Admitting: Oncology

## 2016-08-07 ENCOUNTER — Encounter (HOSPITAL_COMMUNITY): Payer: Self-pay | Admitting: Oncology

## 2016-08-07 ENCOUNTER — Other Ambulatory Visit (HOSPITAL_COMMUNITY): Payer: Self-pay

## 2016-08-07 DIAGNOSIS — R7989 Other specified abnormal findings of blood chemistry: Secondary | ICD-10-CM | POA: Insufficient documentation

## 2016-08-07 MED ORDER — ERGOCALCIFEROL 1.25 MG (50000 UT) PO CAPS: 50000.0000 [IU] | ORAL_CAPSULE | ORAL | 0 refills | Status: DC

## 2016-08-07 NOTE — Progress Notes (Unsigned)
Faxed referral/pt records to [facility]/Dr Barry Dienes (fax# [phone]).

## 2016-08-09 ENCOUNTER — Emergency Department (HOSPITAL_COMMUNITY): Payer: Medicare HMO

## 2016-08-09 ENCOUNTER — Inpatient Hospital Stay (HOSPITAL_COMMUNITY)
Admission: EM | Admit: 2016-08-09 | Discharge: 2016-08-14 | DRG: 190 | Disposition: A | Discharge status: Home Health | Payer: Medicare HMO | Attending: Internal Medicine | Admitting: Internal Medicine

## 2016-08-09 ENCOUNTER — Other Ambulatory Visit: Payer: Self-pay

## 2016-08-09 ENCOUNTER — Encounter (HOSPITAL_COMMUNITY): Payer: Self-pay | Admitting: Emergency Medicine

## 2016-08-09 DIAGNOSIS — I482 Chronic atrial fibrillation: Secondary | ICD-10-CM | POA: Diagnosis present

## 2016-08-09 DIAGNOSIS — R079 Chest pain, unspecified: Secondary | ICD-10-CM | POA: Diagnosis not present

## 2016-08-09 DIAGNOSIS — J9611 Chronic respiratory failure with hypoxia: Secondary | ICD-10-CM | POA: Diagnosis present

## 2016-08-09 DIAGNOSIS — J189 Pneumonia, unspecified organism: Secondary | ICD-10-CM | POA: Diagnosis present

## 2016-08-09 DIAGNOSIS — R0789 Other chest pain: Secondary | ICD-10-CM | POA: Diagnosis present

## 2016-08-09 DIAGNOSIS — J440 Chronic obstructive pulmonary disease with acute lower respiratory infection: Secondary | ICD-10-CM | POA: Diagnosis present

## 2016-08-09 DIAGNOSIS — R197 Diarrhea, unspecified: Secondary | ICD-10-CM | POA: Diagnosis present

## 2016-08-09 DIAGNOSIS — Z7901 Long term (current) use of anticoagulants: Secondary | ICD-10-CM | POA: Diagnosis not present

## 2016-08-09 DIAGNOSIS — I129 Hypertensive chronic kidney disease with stage 1 through stage 4 chronic kidney disease, or unspecified chronic kidney disease: Secondary | ICD-10-CM | POA: Diagnosis present

## 2016-08-09 DIAGNOSIS — F419 Anxiety disorder, unspecified: Secondary | ICD-10-CM | POA: Diagnosis present

## 2016-08-09 DIAGNOSIS — R7309 Other abnormal glucose: Secondary | ICD-10-CM

## 2016-08-09 DIAGNOSIS — R0902 Hypoxemia: Secondary | ICD-10-CM | POA: Diagnosis not present

## 2016-08-09 DIAGNOSIS — M797 Fibromyalgia: Secondary | ICD-10-CM | POA: Diagnosis present

## 2016-08-09 DIAGNOSIS — C259 Malignant neoplasm of pancreas, unspecified: Secondary | ICD-10-CM | POA: Diagnosis present

## 2016-08-09 DIAGNOSIS — J441 Chronic obstructive pulmonary disease with (acute) exacerbation: Secondary | ICD-10-CM | POA: Diagnosis not present

## 2016-08-09 DIAGNOSIS — T380X5A Adverse effect of glucocorticoids and synthetic analogues, initial encounter: Secondary | ICD-10-CM | POA: Diagnosis present

## 2016-08-09 DIAGNOSIS — N171 Acute kidney failure with acute cortical necrosis: Secondary | ICD-10-CM

## 2016-08-09 DIAGNOSIS — Z9981 Dependence on supplemental oxygen: Secondary | ICD-10-CM | POA: Diagnosis not present

## 2016-08-09 DIAGNOSIS — R8271 Bacteriuria: Secondary | ICD-10-CM | POA: Diagnosis present

## 2016-08-09 DIAGNOSIS — R7302 Impaired glucose tolerance (oral): Secondary | ICD-10-CM | POA: Diagnosis not present

## 2016-08-09 DIAGNOSIS — Z87891 Personal history of nicotine dependence: Secondary | ICD-10-CM

## 2016-08-09 DIAGNOSIS — I4891 Unspecified atrial fibrillation: Secondary | ICD-10-CM | POA: Diagnosis not present

## 2016-08-09 DIAGNOSIS — J9610 Chronic respiratory failure, unspecified whether with hypoxia or hypercapnia: Secondary | ICD-10-CM | POA: Diagnosis not present

## 2016-08-09 DIAGNOSIS — T502X5A Adverse effect of carbonic-anhydrase inhibitors, benzothiadiazides and other diuretics, initial encounter: Secondary | ICD-10-CM | POA: Diagnosis present

## 2016-08-09 DIAGNOSIS — Z833 Family history of diabetes mellitus: Secondary | ICD-10-CM

## 2016-08-09 DIAGNOSIS — R739 Hyperglycemia, unspecified: Secondary | ICD-10-CM | POA: Diagnosis present

## 2016-08-09 DIAGNOSIS — N183 Chronic kidney disease, stage 3 unspecified: Secondary | ICD-10-CM | POA: Diagnosis present

## 2016-08-09 DIAGNOSIS — Z7951 Long term (current) use of inhaled steroids: Secondary | ICD-10-CM

## 2016-08-09 DIAGNOSIS — Z823 Family history of stroke: Secondary | ICD-10-CM

## 2016-08-09 DIAGNOSIS — Z880 Allergy status to penicillin: Secondary | ICD-10-CM | POA: Diagnosis not present

## 2016-08-09 DIAGNOSIS — N179 Acute kidney failure, unspecified: Secondary | ICD-10-CM

## 2016-08-09 DIAGNOSIS — D638 Anemia in other chronic diseases classified elsewhere: Secondary | ICD-10-CM | POA: Diagnosis present

## 2016-08-09 DIAGNOSIS — E876 Hypokalemia: Secondary | ICD-10-CM

## 2016-08-09 DIAGNOSIS — K219 Gastro-esophageal reflux disease without esophagitis: Secondary | ICD-10-CM | POA: Diagnosis present

## 2016-08-09 DIAGNOSIS — Z79899 Other long term (current) drug therapy: Secondary | ICD-10-CM

## 2016-08-09 DIAGNOSIS — Z825 Family history of asthma and other chronic lower respiratory diseases: Secondary | ICD-10-CM

## 2016-08-09 LAB — CBC
HEMATOCRIT: 38.6 % (ref 36.0–46.0)
HEMOGLOBIN: 12.3 g/dL (ref 12.0–15.0)
MCH: 29.4 pg (ref 26.0–34.0)
MCHC: 31.9 g/dL (ref 30.0–36.0)
MCV: 92.1 fL (ref 78.0–100.0)
PLATELETS: 198 10*3/uL (ref 150–400)
RBC: 4.19 MIL/uL (ref 3.87–5.11)
RDW: 15 % (ref 11.5–15.5)
WBC: 9.5 10*3/uL (ref 4.0–10.5)

## 2016-08-09 LAB — BASIC METABOLIC PANEL
Anion gap: 11 (ref 5–15)
BUN: 18 mg/dL (ref 6–20)
CHLORIDE: 103 mmol/L (ref 101–111)
CO2: 26 mmol/L (ref 22–32)
CREATININE: 1.12 mg/dL — AB (ref 0.44–1.00)
Calcium: 9.2 mg/dL (ref 8.9–10.3)
GFR calc non Af Amer: 49 mL/min — ABNORMAL LOW (ref >60)
GFR, EST AFRICAN AMERICAN: 57 mL/min — AB (ref >60)
Glucose, Bld: 116 mg/dL — ABNORMAL HIGH (ref 65–99)
POTASSIUM: 4 mmol/L (ref 3.5–5.1)
Sodium: 140 mmol/L (ref 135–145)

## 2016-08-09 LAB — TSH: TSH: 4.405 u[IU]/mL (ref 0.350–4.500)

## 2016-08-09 LAB — TROPONIN I: Troponin I: <0.03 ng/mL (ref <0.03)

## 2016-08-09 LAB — MRSA PCR SCREENING: MRSA BY PCR: NEGATIVE

## 2016-08-09 LAB — PROCALCITONIN

## 2016-08-09 MED ORDER — HYDROCODONE-ACETAMINOPHEN 5-325 MG PO TABS
1.0000 | ORAL_TABLET | ORAL | Status: DC | PRN
  Administered 2016-08-09 – 2016-08-14 (×26): 1 via ORAL
  Filled 2016-08-09 (×26): qty 1

## 2016-08-09 MED ORDER — POTASSIUM CHLORIDE CRYS ER 20 MEQ PO TBCR
20.0000 meq | EXTENDED_RELEASE_TABLET | Freq: Three times a day (TID) | ORAL | Status: DC
Start: 2016-08-09 — End: 2016-08-10
  Administered 2016-08-09 – 2016-08-10 (×3): 20 meq via ORAL
  Filled 2016-08-09 (×3): qty 1

## 2016-08-09 MED ORDER — DILTIAZEM LOAD VIA INFUSION
15.0000 mg | Freq: Once | INTRAVENOUS | Status: AC
  Administered 2016-08-09: 15 mg via INTRAVENOUS
  Filled 2016-08-09: qty 15

## 2016-08-09 MED ORDER — ACETAMINOPHEN 650 MG RE SUPP: 650.0000 mg | Freq: Four times a day (QID) | RECTAL | Status: DC | PRN

## 2016-08-09 MED ORDER — DOCUSATE SODIUM 100 MG PO CAPS
100.0000 mg | ORAL_CAPSULE | Freq: Two times a day (BID) | ORAL | Status: DC
  Administered 2016-08-10 – 2016-08-14 (×9): 100 mg via ORAL
  Filled 2016-08-09 (×10): qty 1

## 2016-08-09 MED ORDER — ONDANSETRON HCL 4 MG PO TABS: 4.0000 mg | ORAL_TABLET | Freq: Four times a day (QID) | ORAL | Status: DC | PRN

## 2016-08-09 MED ORDER — METOPROLOL TARTRATE 50 MG PO TABS
50.0000 mg | ORAL_TABLET | Freq: Two times a day (BID) | ORAL | Status: DC
  Administered 2016-08-09 – 2016-08-14 (×10): 50 mg via ORAL
  Filled 2016-08-09 (×10): qty 1

## 2016-08-09 MED ORDER — METHYLPREDNISOLONE SODIUM SUCC 125 MG IJ SOLR
125.0000 mg | Freq: Once | INTRAMUSCULAR | Status: AC
  Administered 2016-08-09: 125 mg via INTRAVENOUS
  Filled 2016-08-09: qty 2

## 2016-08-09 MED ORDER — BUDESONIDE 0.25 MG/2ML IN SUSP
0.2500 mg | Freq: Two times a day (BID) | RESPIRATORY_TRACT | Status: DC
  Administered 2016-08-09 – 2016-08-11 (×4): 0.25 mg via RESPIRATORY_TRACT
  Filled 2016-08-09 (×4): qty 2

## 2016-08-09 MED ORDER — TEMAZEPAM 15 MG PO CAPS
15.0000 mg | ORAL_CAPSULE | Freq: Every day | ORAL | Status: DC
  Administered 2016-08-09 – 2016-08-13 (×5): 15 mg via ORAL
  Filled 2016-08-09 (×5): qty 1

## 2016-08-09 MED ORDER — IPRATROPIUM-ALBUTEROL 0.5-2.5 (3) MG/3ML IN SOLN
3.0000 mL | Freq: Four times a day (QID) | RESPIRATORY_TRACT | Status: DC
  Administered 2016-08-09 – 2016-08-14 (×20): 3 mL via RESPIRATORY_TRACT
  Filled 2016-08-09 (×20): qty 3

## 2016-08-09 MED ORDER — ACETAMINOPHEN 325 MG PO TABS: 650.0000 mg | ORAL_TABLET | Freq: Four times a day (QID) | ORAL | Status: DC | PRN

## 2016-08-09 MED ORDER — VITAMIN D (ERGOCALCIFEROL) 1.25 MG (50000 UNIT) PO CAPS
50000.0000 [IU] | ORAL_CAPSULE | ORAL | Status: DC
  Administered 2016-08-14: 50000 [IU] via ORAL
  Filled 2016-08-09: qty 1

## 2016-08-09 MED ORDER — LORAZEPAM 0.5 MG PO TABS
0.5000 mg | ORAL_TABLET | Freq: Four times a day (QID) | ORAL | Status: DC | PRN
  Administered 2016-08-10 – 2016-08-14 (×10): 0.5 mg via ORAL
  Filled 2016-08-09 (×10): qty 1

## 2016-08-09 MED ORDER — IPRATROPIUM-ALBUTEROL 0.5-2.5 (3) MG/3ML IN SOLN
3.0000 mL | Freq: Once | RESPIRATORY_TRACT | Status: AC
  Administered 2016-08-09: 3 mL via RESPIRATORY_TRACT
  Filled 2016-08-09: qty 3

## 2016-08-09 MED ORDER — FUROSEMIDE 40 MG PO TABS
40.0000 mg | ORAL_TABLET | Freq: Every day | ORAL | Status: DC
  Administered 2016-08-10: 40 mg via ORAL
  Filled 2016-08-09: qty 1

## 2016-08-09 MED ORDER — PANTOPRAZOLE SODIUM 40 MG PO TBEC
40.0000 mg | DELAYED_RELEASE_TABLET | Freq: Every day | ORAL | Status: DC
Start: 2016-08-10 — End: 2016-08-14
  Administered 2016-08-10 – 2016-08-14 (×5): 40 mg via ORAL
  Filled 2016-08-09 (×5): qty 1

## 2016-08-09 MED ORDER — ESCITALOPRAM OXALATE 10 MG PO TABS
10.0000 mg | ORAL_TABLET | Freq: Every day | ORAL | Status: DC
Start: 2016-08-10 — End: 2016-08-14
  Administered 2016-08-10 – 2016-08-14 (×5): 10 mg via ORAL
  Filled 2016-08-09 (×5): qty 1

## 2016-08-09 MED ORDER — RIVAROXABAN 15 MG PO TABS
15.0000 mg | ORAL_TABLET | Freq: Every day | ORAL | Status: DC
  Administered 2016-08-09 – 2016-08-13 (×5): 15 mg via ORAL
  Filled 2016-08-09 (×5): qty 1

## 2016-08-09 MED ORDER — ERGOCALCIFEROL 1.25 MG (50000 UT) PO CAPS: 50000.0000 [IU] | ORAL_CAPSULE | ORAL | Status: DC

## 2016-08-09 MED ORDER — DILTIAZEM HCL 100 MG IV SOLR
5.0000 mg/h | INTRAVENOUS | Status: DC
  Administered 2016-08-09 – 2016-08-10 (×2): 5 mg/h via INTRAVENOUS
  Filled 2016-08-09: qty 100

## 2016-08-09 MED ORDER — METHYLPREDNISOLONE SODIUM SUCC 125 MG IJ SOLR
60.0000 mg | Freq: Three times a day (TID) | INTRAMUSCULAR | Status: DC
  Administered 2016-08-09 – 2016-08-13 (×11): 60 mg via INTRAVENOUS
  Filled 2016-08-09 (×11): qty 2

## 2016-08-09 MED ORDER — ONDANSETRON HCL 4 MG/2ML IJ SOLN
4.0000 mg | Freq: Four times a day (QID) | INTRAMUSCULAR | Status: DC | PRN
  Administered 2016-08-09: 4 mg via INTRAVENOUS
  Filled 2016-08-09: qty 2

## 2016-08-09 NOTE — H&P (Addendum)
History and Physical  Lindsay Salazar YDX:[id number] DOB: April 12, 1950 DOA: 08/09/2016   PCP: [name], PA-C   Patient coming from: Home  Chief Complaint: dyspnea, chest pain  HPI:  Lindsay Salazar is a 66 y.o. female with medical history of chronic respiratory failure on 2 L, COPD, chronic atrial fibrillation, and pancreatic cancer presented with one-day history of worsening shortness of breath and coughing with white sputum. The patient was using her nebulizers without much success. In addition, the patient has had intermittent chest discomfort for the past 2-3 days. She describes it as a"fluttering that comes and goes"on her left side of the chest. She denies any recent injury or falls. She states that the chest discomfort lasted a few minutes and subsided spontaneously. There is no exacerbating or alleviating factors. She complains of a cough chronically without hemoptysis. She has had some intermittent subjective chills without any headache, nausea, vomiting, abdominal pain. She complains of loose stools in the past 1-2 days. She states that she has had 5 loose bowel movements in the past 24 hours without any hematochezia or melena. She denies any new antibiotics. She denies any recent travels raw or undercooked foods. She denies any sick contacts.  In the emergency department, the patient was noted to have atrial fibrillation with RVR with heart rate 130-140. She was otherwise afebrile and hemodynamically stable. The patient was started on a diltiazem drip with improvement of her heart rate into the 100s.  Recent ER visits/hospitalizations: 6/18-6/19--COPD exacerbation 5/6-5/8--COPD exacerbation 5/1-4 - Acute on chronic respiratory failure with hypoxia from COPD exacerbation and HCAP 4/23-30 - left foot cellulitis and acute COPD exacerbation 4/5-9 - Acute on chronic respiratory failure due to COPD exacerbation 3/30 - RLE cellulitis recheck 3/24-29 - RLE  cellulitis  Assessment/Plan: COPD Exacerbation -Continue IV Solu-Medrol -DuoNeb every 6 hours -Start Pulmicort  Atrial fibrillation with RVR -Precipitated by COPD exacerbation -Continue diltiazem drip -TSH -05/30/2016 echo in the 60-65%, no WMA, moderate TR -Continue Xarelto -CHADSVASc = 3  Atypical chest pain -no angina -cycle troponins  Diarrhea -pt endorsed 5 loose BM in past 24 hours -Cdiff -stool pathogen panel  Chronic respiratory failure with hypoxia - on 2 L at home -Continue bronchodilators  CKD stage 3 -baseline creatinine 0.8-1.2 -am BMP  Hypertension -Controlled -Continue diltiazem and metoprolol tartrate  Pancreatic adenocarcinoma -S/P EUS by Dr. Ardis Hughs on 11/29/2015 with FNA demonstrating adenocarcinoma -re-evaluated for distal pancreatectomy by Dr. Barry Dienes on 03/19/2016. Dr. Barry Dienes has arranged for pulmonology and cardiology clearance and anticipates seeing patient back following this clearance -has pulm appointment 7/13.  Deconditioning -PT eval          Past Medical History:  Diagnosis Date  . Anemia of chronic disease   . Arthritis    bursitis , fibromyalgia  . Arthritis    "right hip" (11/27/2015)  . Asthma   . Chronic bronchitis ([place])   . Chronic respiratory failure ([place])   . Chronic right hip pain   . COPD (chronic obstructive pulmonary disease) ([place])    wears home o2 prn  . Daily headache    "last 2-3 months" (11/27/2015)  . DVT (deep venous thrombosis) ([place]) 03/2016  . Exposure to TB    "mom had it when she was pregnant with me"  . Fibromyalgia   . GERD (gastroesophageal reflux disease)    use to have it but not now  . Heart murmur    4-  60 years old  . Hyperkalemia   .  Hypertension   . On home oxygen therapy    11/27/2015 "whenever I need it; don't know how many liters"  . PAF (paroxysmal atrial fibrillation) ([place])   . Pancreatic cancer ([place])   . Paroxysmal atrial flutter ([place])   . Pneumonia    "several  times" (11/27/2015)  . Pneumothorax    Past Surgical History:  Procedure Laterality Date  . ANTERIOR CERVICAL DECOMP/DISCECTOMY FUSION     "put 4 screws in"  . BACK SURGERY    . CARDIAC CATHETERIZATION  08/2004   Archie Endo 06/18/2010  . CARDIOVERSION N/A 07/18/2015   Procedure: CARDIOVERSION;  Surgeon: Josue Hector, MD;  Location: [facility];  Service: Cardiovascular;  Laterality: N/A;  . ESOPHAGOGASTRODUODENOSCOPY N/A 08/09/2015   Procedure: ESOPHAGOGASTRODUODENOSCOPY (EGD);  Surgeon: Rogene Houston, MD;  Location: [facility];  Service: Endoscopy;  Laterality: N/A;  . EUS N/A 11/29/2015   Procedure: UPPER ENDOSCOPIC ULTRASOUND (EUS) RADIAL;  Surgeon: Milus Banister, MD;  Location: [facility];  Service: Endoscopy;  Laterality: N/A;  . IR GENERIC HISTORICAL  12/18/2015   IR US GUIDE VASC ACCESS RIGHT 12/18/2015 Sandi Mariscal, MD [facility]  . IR GENERIC HISTORICAL  12/18/2015   IR FLUORO GUIDE PORT INSERTION RIGHT 12/18/2015 Sandi Mariscal, MD [facility]  . TONSILLECTOMY    . TUBAL LIGATION     Social History:  reports that she quit smoking about 8 months ago. Her smoking use included Cigarettes. She started smoking about 46 years ago. She has a 23.00 pack-year smoking history. She has never used smokeless tobacco. She reports that she does not drink alcohol or use drugs.   Family History  Problem Relation Age of Onset  . COPD Mother   . Diabetes Father   . Stroke Father      Allergies  Allergen Reactions  . Ibuprofen Nausea And Vomiting  . Other     Seafood   . Penicillins Swelling    Has patient had a PCN reaction causing immediate rash, facial/tongue/throat swelling, SOB or lightheadedness with hypotension: Yes Has patient had a PCN reaction causing severe rash involving mucus membranes or skin necrosis: No Has patient had a PCN reaction that required hospitalization No Has patient had a PCN reaction occurring within the last 10 years: No If all of the above answers  are "NO", then may proceed with Cephalosporin use.   . Tiotropium Bromide Monohydrate Itching  . Tramadol Nausea And Vomiting     Prior to Admission medications   Medication Sig Start Date End Date Taking? Authorizing Provider  COMBIVENT RESPIMAT 20-100 MCG/ACT AERS respimat Inhale 1 puff into the lungs 2 (two) times daily. 12/03/15  Yes Rai, Ripudeep K, MD  diltiazem (CARDIZEM CD) 240 MG 24 hr capsule Take 1 capsule (240 mg total) by mouth daily. 05/23/16 08/21/16 Yes Lendon Colonel, NP  ergocalciferol (VITAMIN D2) 50000 units capsule Take 1 capsule (50,000 Units total) by mouth once a week. 08/07/16  Yes Kefalas, Manon Hilding, PA-C  escitalopram (LEXAPRO) 10 MG tablet Take 1 tablet by mouth daily. 08/04/16  Yes [provider]  furosemide (LASIX) 40 MG tablet Take 1 tablet (40 mg total) by mouth daily. 06/06/16  Yes Kathie Dike, MD  HYDROcodone-acetaminophen (NORCO/VICODIN) 5-325 MG tablet Take 1 tablet by mouth every 4 (four) hours as needed. 07/24/16  Yes Nat Christen, MD  ipratropium-albuterol (DUONEB) 0.5-2.5 (3) MG/3ML SOLN Inhale 3 mLs into the lungs every 6 (six) hours as needed (for shortness of breath).  03/03/16  Yes [provider]  LORazepam (ATIVAN) 0.5 MG tablet Take 0.5 mg by mouth every 6 (six) hours as needed for anxiety.   Yes [provider]  metoprolol tartrate (LOPRESSOR) 50 MG tablet Take 1 tablet (50 mg total) by mouth 2 (two) times daily. 03/02/16  Yes Kathie Dike, MD  OXYGEN Inhale 2 L into the lungs daily as needed (for breathing).   Yes [provider]  pantoprazole (PROTONIX) 40 MG tablet Take 1 tablet (40 mg total) by mouth daily. 12/03/15  Yes Rai, Ripudeep K, MD  potassium chloride SA (K-DUR,KLOR-CON) 20 MEQ tablet Take 1 tablet (20 mEq total) by mouth 3 (three) times daily. 07/17/16  Yes Kefalas, Manon Hilding, PA-C  Rivaroxaban (XARELTO) 15 MG TABS tablet Take 1 tablet (15 mg total) by mouth daily with supper. 05/01/16  Yes Kharma Sampsel, Shanon Brow,  MD  SYMBICORT 160-4.5 MCG/ACT inhaler Inhale 1 puff into the lungs 2 (two) times daily. 12/03/15  Yes Rai, Ripudeep K, MD  temazepam (RESTORIL) 15 MG capsule Take 15 mg by mouth at bedtime.   Yes [provider]  VENTOLIN HFA 108 (90 Base) MCG/ACT inhaler Take 1-2 puffs by mouth every 6 (six) hours as needed for wheezing or shortness of breath. 08/04/16  Yes [provider]  docusate sodium (COLACE) 100 MG capsule Take 1 capsule (100 mg total) by mouth 2 (two) times daily. 07/22/16   Isaac Bliss, Rayford Halsted, MD    Review of Systems:  Constitutional:  No weight loss, night sweats, Fevers, chills, fatigue.  Head&Eyes: No headache.  No vision loss.  No eye pain or scotoma ENT:  No Difficulty swallowing,Tooth/dental problems,Sore throat,  No ear ache, post nasal drip,  Cardio-vascular:  No  Orthopnea, PND, swelling in lower extremities,  dizziness, palpitations  GI:  No  abdominal pain, nausea, vomiting, diarrhea, loss of appetite, hematochezia, melena, heartburn, indigestion, Resp:  No coughing up of blood .No wheezing.No chest wall deformity  Skin:  no rash or lesions.  GU:  no dysuria, change in color of urine, no urgency or frequency. No flank pain.  Musculoskeletal:  No joint pain or swelling. No decreased range of motion. No back pain.  Psych:  No change in mood or affect. No depression or anxiety. Neurologic: No headache, no dysesthesia, no focal weakness, no vision loss. No syncope  Physical Exam: Vitals:   08/09/16 1302 08/09/16 1315 08/09/16 1334 08/09/16 1430  BP:  116/86  126/63  Pulse:  83    Resp:  (!) 27  18  Temp:      TempSrc:      SpO2: 99% 100% 100%   Weight:      Height:       General:  A&O x 3, NAD, nontoxic, pleasant/cooperative Head/Eye: No conjunctival hemorrhage, no icterus, [place]/AT, No nystagmus ENT:  No icterus,  No thrush, good dentition, no pharyngeal exudate Neck:  No masses, no lymphadenpathy, no bruits CV:  IRRR, no rub, no  gallop, no S3 Lung:  Bibasilar rales bilateral wheeze. Good air movement.  Abdomen: soft/NT, +BS, nondistended, no peritoneal signs Ext: No cyanosis, No rashes, No petechiae, No lymphangitis, No edema Neuro: CNII-XII intact, strength 4/5 in bilateral upper and lower extremities, no dysmetria  Labs on Admission:  Basic Metabolic Panel:  Recent Labs Lab 08/05/16 1444 08/09/16 1320  NA 133* 140  K 3.7 4.0  CL 101 103  CO2 22 26  GLUCOSE 131* 116*  BUN 30* 18  CREATININE 1.10* 1.12*  CALCIUM 8.5*  9.2  MG 1.6*  --   PHOS 2.5  --    Liver Function Tests:  Recent Labs Lab 08/05/16 1444  AST 17  ALT 14  ALKPHOS 81  BILITOT 0.6  PROT 6.2*  ALBUMIN 3.2*   No results for input(s): LIPASE, AMYLASE in the last 168 hours. No results for input(s): AMMONIA in the last 168 hours. CBC:  Recent Labs Lab 08/05/16 1444 08/09/16 1320  WBC 12.6* 9.5  NEUTROABS 9.0*  --   HGB 10.9* 12.3  HCT 34.8* 38.6  MCV 95.1 92.1  PLT 184 198   Coagulation Profile: No results for input(s): INR, PROTIME in the last 168 hours. Cardiac Enzymes:  Recent Labs Lab 08/09/16 1320  TROPONINI <0.03   BNP: Invalid input(s): POCBNP CBG: No results for input(s): GLUCAP in the last 168 hours. Urine analysis:    Component Value Date/Time   COLORURINE YELLOW 06/03/2016 2100   APPEARANCEUR CLEAR 06/03/2016 2100   LABSPEC 1.025 06/03/2016 2100   PHURINE 5.0 06/03/2016 2100   GLUCOSEU 50 (A) 06/03/2016 2100   HGBUR NEGATIVE 06/03/2016 2100   [place] NEGATIVE 06/03/2016 2100   [place] NEGATIVE 06/03/2016 2100   PROTEINUR NEGATIVE 06/03/2016 2100   NITRITE NEGATIVE 06/03/2016 2100   LEUKOCYTESUR NEGATIVE 06/03/2016 2100   Sepsis Labs: @LABRCNTIP (procalcitonin:4,lacticidven:4) )No results found for this or any previous visit (from the past 240 hour(s)).   Radiological Exams on Admission: Dg Chest 2 View  Result Date: 08/09/2016 CLINICAL DATA:  Chest pain EXAM: CHEST  2 VIEW  COMPARISON:  07/21/2016 FINDINGS: Lungs are markedly hyperinflated. Hazy opacity identified over the lateral left upper lobe potentially related to superimposition of soft tissue, but pneumonia not excluded. Lateral film suggests airspace disease in the right middle lobe. The cardiopericardial silhouette is within normal limits for size. Right Port-A-Cath remains in place. The visualized bony structures of the thorax are intact. Telemetry leads overlie the chest. IMPRESSION: Hyperexpansion with question of left upper lobe airspace opacity raising the question of pneumonia. Lateral film noted with opacity over the heart as can be seen in cases of right middle lobe airspace disease. Electronically Signed   By: Misty Stanley M.D.   On: 08/09/2016 14:05    EKG: Independently reviewed. Afib with RVR, nonspecific ST-T changes    Time spent:60 minutes Code Status:   FULL Family Communication:  No Family at bedside Disposition Plan: expect 2-3 day hospitalization Consults called: none DVT Prophylaxis:Xarelto  Lew Prout, DO  [facility] Pager [phone]  If 7PM-7AM, please contact night-coverage [email or web address] Password [email or web address] 08/09/2016, 3:07 PM

## 2016-08-09 NOTE — ED Provider Notes (Signed)
[facility] DEPT Provider Note   CSN: [id number] Arrival date & time: 08/09/16  1239     History   Chief Complaint Chief Complaint  Patient presents with  . Chest Pain    HPI Lindsay Salazar is a 66 y.o. female.  Patient with COPD history on 2 L home oxygen, pancreatic cancer, atrial fibrillation on Xarelto, respiratory failure, anemia presents with worsening shortness of breath and chest discomfort for 3 days. Patient feel this started since the death of her dog. Intermittent symptoms. Patient's had worsening shortness of breath with walking.  No leg edema or weight changes. Patient has been compliant with her medications.      Past Medical History:  Diagnosis Date  . Anemia of chronic disease   . Arthritis    bursitis , fibromyalgia  . Arthritis    "right hip" (11/27/2015)  . Asthma   . Chronic bronchitis ([place])   . Chronic respiratory failure ([place])   . Chronic right hip pain   . COPD (chronic obstructive pulmonary disease) ([place])    wears home o2 prn  . Daily headache    "last 2-3 months" (11/27/2015)  . DVT (deep venous thrombosis) ([place]) 03/2016  . Exposure to TB    "mom had it when she was pregnant with me"  . Fibromyalgia   . GERD (gastroesophageal reflux disease)    use to have it but not now  . Heart murmur    75-  62 years old  . Hyperkalemia   . Hypertension   . On home oxygen therapy    11/27/2015 "whenever I need it; don't know how many liters"  . PAF (paroxysmal atrial fibrillation) ([place])   . Pancreatic cancer ([place])   . Paroxysmal atrial flutter ([place])   . Pneumonia    "several times" (11/27/2015)  . Pneumothorax     Patient Active Problem List   Diagnosis Date Noted  . Low vitamin D level 08/07/2016  . CKD (chronic kidney disease) stage 3, GFR 30-59 ml/min 07/21/2016  . Malnutrition of moderate degree 06/04/2016  . Cellulitis of left foot 05/26/2016  . COPD exacerbation ([place]) 05/09/2016  . Cellulitis 04/26/2016  . Chronic respiratory failure  with hypoxia ([place]) 04/26/2016  . Hypoxemia 03/30/2016  . Acute on chronic respiratory failure with hypoxia ([place]) 03/29/2016  . Leukocytosis 03/29/2016  . Anemia of chronic disease 03/29/2016  . Essential hypertension 03/29/2016  . HCAP (healthcare-associated pneumonia) 03/28/2016  . B12 deficiency 01/24/2016  . COPD with acute exacerbation ([place]) 01/20/2016  . Pancreatic cancer ([place]) 12/07/2015  . Chronic anticoagulation-Xarelto 09/06/2015  . Gastrointestinal hemorrhage with melena 08/08/2015  . Atrial fibrillation ([place]) 07/17/2015    Past Surgical History:  Procedure Laterality Date  . ANTERIOR CERVICAL DECOMP/DISCECTOMY FUSION     "put 4 screws in"  . BACK SURGERY    . CARDIAC CATHETERIZATION  08/2004   Archie Endo 06/18/2010  . CARDIOVERSION N/A 07/18/2015   Procedure: CARDIOVERSION;  Surgeon: Josue Hector, MD;  Location: [facility];  Service: Cardiovascular;  Laterality: N/A;  . ESOPHAGOGASTRODUODENOSCOPY N/A 08/09/2015   Procedure: ESOPHAGOGASTRODUODENOSCOPY (EGD);  Surgeon: Rogene Houston, MD;  Location: [facility] SUITE;  Service: Endoscopy;  Laterality: N/A;  . EUS N/A 11/29/2015   Procedure: UPPER ENDOSCOPIC ULTRASOUND (EUS) RADIAL;  Surgeon: Milus Banister, MD;  Location: [facility] END[facility];  Service: Endoscopy;  Laterality: N/A;  . IR GENERIC HISTORICAL  12/18/2015   IR US GUIDE VASC ACCESS RIGHT 12/18/2015 Sandi Mariscal, MD [facility]  . IR  GENERIC HISTORICAL  12/18/2015   IR FLUORO GUIDE PORT INSERTION RIGHT 12/18/2015 Sandi Mariscal, MD [facility]  . TONSILLECTOMY    . TUBAL LIGATION      OB History    Gravida Para Term Preterm AB Living   2         2   SAB TAB Ectopic Multiple Live Births                   Home Medications    Prior to Admission medications   Medication Sig Start Date End Date Taking? Authorizing Provider  COMBIVENT RESPIMAT 20-100 MCG/ACT AERS respimat Inhale 1 puff into the lungs 2 (two) times daily. 12/03/15  Yes Rai, Ripudeep K, MD  diltiazem  (CARDIZEM CD) 240 MG 24 hr capsule Take 1 capsule (240 mg total) by mouth daily. 05/23/16 08/21/16 Yes Lendon Colonel, NP  ergocalciferol (VITAMIN D2) 50000 units capsule Take 1 capsule (50,000 Units total) by mouth once a week. 08/07/16  Yes Kefalas, Manon Hilding, PA-C  escitalopram (LEXAPRO) 10 MG tablet Take 1 tablet by mouth daily. 08/04/16  Yes [provider]  furosemide (LASIX) 40 MG tablet Take 1 tablet (40 mg total) by mouth daily. 06/06/16  Yes Kathie Dike, MD  HYDROcodone-acetaminophen (NORCO/VICODIN) 5-325 MG tablet Take 1 tablet by mouth every 4 (four) hours as needed. 07/24/16  Yes Nat Christen, MD  ipratropium-albuterol (DUONEB) 0.5-2.5 (3) MG/3ML SOLN Inhale 3 mLs into the lungs every 6 (six) hours as needed (for shortness of breath).  03/03/16  Yes [provider]  LORazepam (ATIVAN) 0.5 MG tablet Take 0.5 mg by mouth every 6 (six) hours as needed for anxiety.   Yes [provider]  metoprolol tartrate (LOPRESSOR) 50 MG tablet Take 1 tablet (50 mg total) by mouth 2 (two) times daily. 03/02/16  Yes Kathie Dike, MD  OXYGEN Inhale 2 L into the lungs daily as needed (for breathing).   Yes [provider]  pantoprazole (PROTONIX) 40 MG tablet Take 1 tablet (40 mg total) by mouth daily. 12/03/15  Yes Rai, Ripudeep K, MD  potassium chloride SA (K-DUR,KLOR-CON) 20 MEQ tablet Take 1 tablet (20 mEq total) by mouth 3 (three) times daily. 07/17/16  Yes Kefalas, Manon Hilding, PA-C  Rivaroxaban (XARELTO) 15 MG TABS tablet Take 1 tablet (15 mg total) by mouth daily with supper. 05/01/16  Yes Tat, Shanon Brow, MD  SYMBICORT 160-4.5 MCG/ACT inhaler Inhale 1 puff into the lungs 2 (two) times daily. 12/03/15  Yes Rai, Ripudeep K, MD  temazepam (RESTORIL) 15 MG capsule Take 15 mg by mouth at bedtime.   Yes [provider]  VENTOLIN HFA 108 (90 Base) MCG/ACT inhaler Take 1-2 puffs by mouth every 6 (six) hours as needed for wheezing or shortness of breath. 08/04/16  Yes [provider]  docusate sodium (COLACE) 100 MG capsule Take 1 capsule (100 mg total) by mouth 2 (two) times daily. 07/22/16   Erline Hau, MD    Family History Family History  Problem Relation Age of Onset  . COPD Mother   . Diabetes Father   . Stroke Father     Social History Social History  Substance Use Topics  . Smoking status: Former Smoker    Packs/day: 0.50    Years: 46.00    Types: Cigarettes    Start date: 10/09/1969    Quit date: 11/29/2015  . Smokeless tobacco: Never Used  . Alcohol use No     Comment: quit 30  years ago     Allergies   Ibuprofen; Other; Penicillins; Tiotropium bromide monohydrate; and Tramadol   Review of Systems Review of Systems   Physical Exam Updated Vital Signs BP 126/63   Pulse 83   Temp 98.5 F (36.9 C) (Oral)   Resp 18   Ht 5\' 4"  (1.626 m)   Wt 60.8 kg (134 lb)   SpO2 100%   BMI 23.00 kg/m   Physical Exam  Constitutional: She is oriented to person, place, and time. She appears well-developed.  HENT:  Head: Normocephalic and atraumatic.  Eyes: Right eye exhibits no discharge. Left eye exhibits no discharge.  Neck: Normal range of motion. Neck supple. No tracheal deviation present.  Cardiovascular: An irregularly irregular rhythm present. Tachycardia present.   Pulmonary/Chest: She has wheezes (increased resp effort).  Abdominal: Soft. She exhibits no distension. There is no tenderness. There is no guarding.  Musculoskeletal: She exhibits no edema.  Neurological: She is alert and oriented to person, place, and time.  Skin: Skin is warm. Rash noted.  Psychiatric: She has a normal mood and affect.  Nursing note and vitals reviewed.    ED Treatments / Results  Labs (all labs ordered are listed, but only abnormal results are displayed) Labs Reviewed  BASIC METABOLIC PANEL - Abnormal; Notable for the following:       Result Value   Glucose, Bld 116 (*)    Creatinine, Ser 1.12 (*)    GFR calc non Af Amer  49 (*)    GFR calc Af Amer 57 (*)    All other components within normal limits  CBC  TROPONIN I    EKG  EKG Interpretation None     EKG reviewed heart rate 133, irregular tachycardia consistent with atrial fibrillation. Normal QT, nonspecific ST changes.  Radiology Dg Chest 2 View  Result Date: 08/09/2016 CLINICAL DATA:  Chest pain EXAM: CHEST  2 VIEW COMPARISON:  07/21/2016 FINDINGS: Lungs are markedly hyperinflated. Hazy opacity identified over the lateral left upper lobe potentially related to superimposition of soft tissue, but pneumonia not excluded. Lateral film suggests airspace disease in the right middle lobe. The cardiopericardial silhouette is within normal limits for size. Right Port-A-Cath remains in place. The visualized bony structures of the thorax are intact. Telemetry leads overlie the chest. IMPRESSION: Hyperexpansion with question of left upper lobe airspace opacity raising the question of pneumonia. Lateral film noted with opacity over the heart as can be seen in cases of right middle lobe airspace disease. Electronically Signed   By: Misty Stanley M.D.   On: 08/09/2016 14:05    Procedures Procedures (including critical care time) CRITICAL CARE Performed by: Mariea Clonts   Total critical care time: 35 minutes  Critical care time was exclusive of separately billable procedures and treating other patients.  Critical care was necessary to treat or prevent imminent or life-threatening deterioration.  Critical care was time spent personally by me on the following activities: development of treatment plan with patient and/or surrogate as well as nursing, discussions with consultants, evaluation of patient's response to treatment, examination of patient, obtaining history from patient or surrogate, ordering and performing treatments and interventions, ordering and review of laboratory studies, ordering and review of radiographic studies, pulse oximetry and  re-evaluation of patient's condition.  Medications Ordered in ED Medications  diltiazem (CARDIZEM) 1 mg/mL load via infusion 15 mg (15 mg Intravenous Bolus from Bag 08/09/16 1319)    And  diltiazem (CARDIZEM) 100 mg in dextrose 5 %  100 mL (1 mg/mL) infusion (15 mg/hr Intravenous Canceled Entry 08/09/16 1318)  ipratropium-albuterol (DUONEB) 0.5-2.5 (3) MG/3ML nebulizer solution 3 mL (3 mLs Nebulization Given 08/09/16 1334)  methylPREDNISolone sodium succinate (SOLU-MEDROL) 125 mg/2 mL injection 125 mg (125 mg Intravenous Given 08/09/16 1314)     Initial Impression / Assessment and Plan / ED Course  I have reviewed the triage vital signs and the nursing notes.  Pertinent labs & imaging results that were available during my care of the patient were reviewed by me and considered in my medical decision making (see chart for details).     Patient presents with chest pain and shortness of breath along with elevated heart rate. Patient currently atrial fibrillation with rapid ventricular response diltiazem drip and bolus ordered. Breathing treatment ordered for COPD exacerbation. Chest pain intermittent. Plan for cardiac and pulmonary workup with likely stepdown admission.  The patients results and plan were reviewed and discussed.   Any x-rays performed were independently reviewed by myself.   Differential diagnosis were considered with the presenting HPI.  Medications  diltiazem (CARDIZEM) 1 mg/mL load via infusion 15 mg (15 mg Intravenous Bolus from Bag 08/09/16 1319)    And  diltiazem (CARDIZEM) 100 mg in dextrose 5 % 100 mL (1 mg/mL) infusion (15 mg/hr Intravenous Canceled Entry 08/09/16 1318)  ipratropium-albuterol (DUONEB) 0.5-2.5 (3) MG/3ML nebulizer solution 3 mL (3 mLs Nebulization Given 08/09/16 1334)  methylPREDNISolone sodium succinate (SOLU-MEDROL) 125 mg/2 mL injection 125 mg (125 mg Intravenous Given 08/09/16 1314)    Vitals:   08/09/16 1302 08/09/16 1315 08/09/16 1334 08/09/16 1430  BP:   116/86  126/63  Pulse:  83    Resp:  (!) 27  18  Temp:      TempSrc:      SpO2: 99% 100% 100%   Weight:      Height:        Final diagnoses:  Atrial fibrillation with RVR (HCC)  Acute chest pain  Acute exacerbation of chronic obstructive pulmonary disease (COPD) ([place])    Admission/ observation were discussed with the admitting physician, patient and/or family and they are comfortable with the plan.    Final Clinical Impressions(s) / ED Diagnoses   Final diagnoses:  Atrial fibrillation with RVR (HCC)  Acute chest pain  Acute exacerbation of chronic obstructive pulmonary disease (COPD) ([place])    New Prescriptions New Prescriptions   No medications on file     Elnora Morrison, MD 08/09/16 1444

## 2016-08-09 NOTE — ED Notes (Signed)
Patient transported to X-ray 

## 2016-08-09 NOTE — ED Triage Notes (Signed)
CP near lt breast.  Pt states she felt like it was because she got upset this morning.  Her dog just recently died.

## 2016-08-10 DIAGNOSIS — J441 Chronic obstructive pulmonary disease with (acute) exacerbation: Secondary | ICD-10-CM

## 2016-08-10 DIAGNOSIS — R7309 Other abnormal glucose: Secondary | ICD-10-CM

## 2016-08-10 DIAGNOSIS — R7302 Impaired glucose tolerance (oral): Secondary | ICD-10-CM

## 2016-08-10 DIAGNOSIS — N183 Chronic kidney disease, stage 3 unspecified: Secondary | ICD-10-CM

## 2016-08-10 DIAGNOSIS — R0902 Hypoxemia: Secondary | ICD-10-CM

## 2016-08-10 DIAGNOSIS — J9610 Chronic respiratory failure, unspecified whether with hypoxia or hypercapnia: Secondary | ICD-10-CM

## 2016-08-10 DIAGNOSIS — N179 Acute kidney failure, unspecified: Secondary | ICD-10-CM

## 2016-08-10 DIAGNOSIS — N171 Acute kidney failure with acute cortical necrosis: Secondary | ICD-10-CM

## 2016-08-10 LAB — GASTROINTESTINAL PANEL BY PCR, STOOL (REPLACES STOOL CULTURE)

## 2016-08-10 LAB — GLUCOSE, CAPILLARY
GLUCOSE-CAPILLARY: 163 mg/dL — AB (ref 65–99)
GLUCOSE-CAPILLARY: 207 mg/dL — AB (ref 65–99)

## 2016-08-10 LAB — TROPONIN I

## 2016-08-10 LAB — BASIC METABOLIC PANEL
Anion gap: 14 (ref 5–15)
BUN: 27 mg/dL — AB (ref 6–20)
CALCIUM: 8.6 mg/dL — AB (ref 8.9–10.3)
CO2: 21 mmol/L — ABNORMAL LOW (ref 22–32)
CREATININE: 1.92 mg/dL — AB (ref 0.44–1.00)
Chloride: 97 mmol/L — ABNORMAL LOW (ref 101–111)
GFR calc Af Amer: 30 mL/min — ABNORMAL LOW (ref >60)
GFR calc non Af Amer: 26 mL/min — ABNORMAL LOW (ref >60)
Glucose, Bld: 252 mg/dL — ABNORMAL HIGH (ref 65–99)
Potassium: 4.7 mmol/L (ref 3.5–5.1)
SODIUM: 132 mmol/L — AB (ref 135–145)

## 2016-08-10 LAB — MAGNESIUM: MAGNESIUM: 1.3 mg/dL — AB (ref 1.7–2.4)

## 2016-08-10 MED ORDER — DILTIAZEM HCL 60 MG PO TABS
60.0000 mg | ORAL_TABLET | Freq: Four times a day (QID) | ORAL | Status: DC
  Administered 2016-08-10 – 2016-08-11 (×5): 60 mg via ORAL
  Filled 2016-08-10 (×5): qty 1

## 2016-08-10 MED ORDER — SODIUM CHLORIDE 0.9 % IV SOLN
INTRAVENOUS | Status: AC
  Administered 2016-08-10 (×2): via INTRAVENOUS

## 2016-08-10 MED ORDER — INSULIN ASPART 100 UNIT/ML ~~LOC~~ SOLN
0.0000 [IU] | Freq: Three times a day (TID) | SUBCUTANEOUS | Status: DC
  Administered 2016-08-10: 2 [IU] via SUBCUTANEOUS
  Administered 2016-08-11: 1 [IU] via SUBCUTANEOUS
  Administered 2016-08-11: 2 [IU] via SUBCUTANEOUS
  Administered 2016-08-12: 1 [IU] via SUBCUTANEOUS
  Administered 2016-08-12 – 2016-08-13 (×3): 2 [IU] via SUBCUTANEOUS
  Administered 2016-08-13 – 2016-08-14 (×2): 1 [IU] via SUBCUTANEOUS
  Administered 2016-08-14: 2 [IU] via SUBCUTANEOUS

## 2016-08-10 MED ORDER — DILTIAZEM HCL 100 MG IV SOLR
INTRAVENOUS | Status: AC
  Filled 2016-08-10: qty 100

## 2016-08-10 NOTE — Progress Notes (Signed)
PROGRESS NOTE  Lindsay Salazar CZY:[id number] DOB: 1950/11/30 DOA: 08/09/2016 PCP: Lindsay Fairy, PA-C  Brief History:   66 y.o. female with medical history of chronic respiratory failure on 2 L, COPD, chronic atrial fibrillation, and pancreatic cancer presented with one-day history of worsening shortness of breath and coughing with white sputum. The patient was using her nebulizers without much success. In addition, the patient has had intermittent chest discomfort for the past 2-3 days. She describes it as a"fluttering that comes and goes"on her left side of the chest. She denies any recent injury or falls. She states that the chest discomfort lasted a few minutes and subsided spontaneously. There is no exacerbating or alleviating factors.  She complains of loose stools in the past 1-2 days. She states that she has had 5 loose bowel movements in the past 24 hours prior to admission without any hematochezia or melena. She denies any new antibiotics. She denies any recent travels raw or undercooked foods. She denies any sick contacts.  Assessment/Plan: COPD Exacerbation -Continue IV Solu-Medrol -DuoNeb every 6 hours -Continue Pulmicort -still wheezing and dyspneic with exertion -remains on 2 L Lindsay Salazar  Atrial fibrillation with RVR -Precipitated by COPD exacerbation -Transition diltiazem drip to po diltiazem -TSH--4.405 -05/30/2016 echo in the 60-65%, no WMA, moderate TR -Continue Xarelto -CHADSVASc = 3  Acute on chronic renal failure CKD stage 3 -likely due to hemodynamic changs -d/c lasix and KCl supplement -renal ultrasound -check UA -baseline creatinine 0.8-1.2 -am BMP -plan IVF x 12 hours  Atypical chest pain -no angina -cycle troponins--neg x 3 -personally reviewed EKG--no ST-T changes  Diarrhea -pt endorsed 5 loose BM in past 24 hours prior to admission -no diarrhea since admission -Cdiff -stool pathogen panel  Hyperglycemia/Impaired glucose  tolerance -elevation due to steroids -start novolog sliding scale  Chronic respiratory failure with hypoxia - on 2 L at home -Continue bronchodilators  Hypertension -Controlled -Continue diltiazem and metoprolol tartrate  Pancreatic adenocarcinoma -S/P EUS by Dr. Ardis Hughs on 11/29/2015 with FNA demonstrating adenocarcinoma -re-evaluated for distal pancreatectomy by Dr. Barry Dienes on 03/19/2016.  -CA 19-9 elevated initiation of treatment at 197 -Started on gemcitabine/Abraxane beginning on 09/32/3557  -Course complicated by multiple hospitalizations unrelated to her malignancy or treatment -last treatment was on 05/15/2016 with Gemcitabine/Abraxane -Medonc planning restaging MRI abd and if stable, refer back to Dr. Barry Dienes -has pulm appointment 7/13.  Deconditioning -PT eval   Disposition Plan:   Home in 1-2 days if renal function improves Family Communication:  No Family at bedside  Consultants:  none  Code Status:  FULL  DVT Prophylaxis:  rivaroxaban   Procedures: As Listed in Progress Note Above  Antibiotics: None    Subjective: Patient states that she is breathing 50% better than yesterday. She still complains of a cough. Denies any fevers, chills, chest pain, nausea, vomiting, abdominal pain. She states her diarrhea has improved. No dysuria or hematuria.  Objective: Vitals:   08/10/16 0823 08/10/16 0900 08/10/16 1117 08/10/16 1344  BP:  138/82    Pulse:  95 96   Resp:  (!) 21 (!) 24   Temp:   98.4 F (36.9 C)   TempSrc:   Oral   SpO2: 98% 100% 100% 98%  Weight:      Height:        Intake/Output Summary (Last 24 hours) at 08/10/16 1426 Last data filed at 08/10/16 1422  Gross per 24 hour  Intake  385.42 ml  Output               50 ml  Net           335.42 ml   Weight change:  Exam:   General:  Pt is alert, follows commands appropriately, not in acute distress  HEENT: No icterus, No thrush, No neck mass, [place]/AT  Cardiovascular: RRR,  S1/S2, no rubs, no gallops  Respiratory: Bilateral expiratory wheeze. Bibasilar rales.  Abdomen: Soft/+BS, non tender, non distended, no guarding  Extremities: No edema, No lymphangitis, No petechiae, No rashes, no synovitis   Data Reviewed: I have personally reviewed following labs and imaging studies Basic Metabolic Panel:  Recent Labs Lab 08/05/16 1444 08/09/16 1320 08/10/16 0045  NA 133* 140 132*  K 3.7 4.0 4.7  CL 101 103 97*  CO2 22 26 21*  GLUCOSE 131* 116* 252*  BUN 30* 18 27*  CREATININE 1.10* 1.12* 1.92*  CALCIUM 8.5* 9.2 8.6*  MG 1.6*  --  1.3*  PHOS 2.5  --   --    Liver Function Tests:  Recent Labs Lab 08/05/16 1444  AST 17  ALT 14  ALKPHOS 81  BILITOT 0.6  PROT 6.2*  ALBUMIN 3.2*   No results for input(s): LIPASE, AMYLASE in the last 168 hours. No results for input(s): AMMONIA in the last 168 hours. Coagulation Profile: No results for input(s): INR, PROTIME in the last 168 hours. CBC:  Recent Labs Lab 08/05/16 1444 08/09/16 1320  WBC 12.6* 9.5  NEUTROABS 9.0*  --   HGB 10.9* 12.3  HCT 34.8* 38.6  MCV 95.1 92.1  PLT 184 198   Cardiac Enzymes:  Recent Labs Lab 08/09/16 1320 08/09/16 2011 08/10/16 0045  TROPONINI <0.03 <0.03 <0.03   BNP: Invalid input(s): POCBNP CBG: No results for input(s): GLUCAP in the last 168 hours. HbA1C: No results for input(s): HGBA1C in the last 72 hours. Urine analysis:    Component Value Date/Time   COLORURINE YELLOW 06/03/2016 2100   APPEARANCEUR CLEAR 06/03/2016 2100   LABSPEC 1.025 06/03/2016 2100   PHURINE 5.0 06/03/2016 2100   GLUCOSEU 50 (A) 06/03/2016 2100   HGBUR NEGATIVE 06/03/2016 2100   [place] NEGATIVE 06/03/2016 2100   [place] NEGATIVE 06/03/2016 2100   PROTEINUR NEGATIVE 06/03/2016 2100   NITRITE NEGATIVE 06/03/2016 2100   LEUKOCYTESUR NEGATIVE 06/03/2016 2100   Sepsis Labs: @LABRCNTIP (procalcitonin:4,lacticidven:4) ) Recent Results (from the past 240 hour(s))  MRSA  PCR Screening     Status: None   Collection Time: 08/09/16  3:49 PM  Result Value Ref Range Status   MRSA by PCR NEGATIVE NEGATIVE Final    Comment:        The GeneXpert MRSA Assay (FDA approved for NASAL specimens only), is one component of a comprehensive MRSA colonization surveillance program. It is not intended to diagnose MRSA infection nor to guide or monitor treatment for MRSA infections.      Scheduled Meds: . budesonide (PULMICORT) nebulizer solution  0.25 mg Nebulization BID  . docusate sodium  100 mg Oral BID  . escitalopram  10 mg Oral Daily  . ipratropium-albuterol  3 mL Nebulization Q6H  . methylPREDNISolone (SOLU-MEDROL) injection  60 mg Intravenous Q8H  . metoprolol tartrate  50 mg Oral BID  . pantoprazole  40 mg Oral Daily  . Rivaroxaban  15 mg Oral Q supper  . temazepam  15 mg Oral QHS  . [START ON 08/14/2016] Vitamin D (Ergocalciferol)  50,000 Units Oral Q7 days  Continuous Infusions: . diltiazem (CARDIZEM) infusion 5 mg/hr (08/10/16 0015)    Procedures/Studies: Dg Chest 2 View  Result Date: 08/09/2016 CLINICAL DATA:  Chest pain EXAM: CHEST  2 VIEW COMPARISON:  07/21/2016 FINDINGS: Lungs are markedly hyperinflated. Hazy opacity identified over the lateral left upper lobe potentially related to superimposition of soft tissue, but pneumonia not excluded. Lateral film suggests airspace disease in the right middle lobe. The cardiopericardial silhouette is within normal limits for size. Right Port-A-Cath remains in place. The visualized bony structures of the thorax are intact. Telemetry leads overlie the chest. IMPRESSION: Hyperexpansion with question of left upper lobe airspace opacity raising the question of pneumonia. Lateral film noted with opacity over the heart as can be seen in cases of right middle lobe airspace disease. Electronically Signed   By: Misty Stanley M.D.   On: 08/09/2016 14:05   Ct Abdomen W Wo Contrast  Result Date: 07/31/2016 CLINICAL DATA:   66 year old female with history of pancreatic cancer. Restaging examination. EXAM: CT ABDOMEN WITHOUT AND WITH CONTRAST TECHNIQUE: Multidetector CT imaging of the abdomen was performed following the standard protocol before and following the bolus administration of intravenous contrast. CONTRAST:  25mL ISOVUE-300 IOPAMIDOL (ISOVUE-300) INJECTION 61% COMPARISON:  MRI of the abdomen 03/12/2016. CT of the abdomen and pelvis 11/27/2015. FINDINGS: Lower chest: Unremarkable. Hepatobiliary: Multiple tiny subcentimeter low-attenuation lesions are again noted scattered throughout the hepatic parenchyma, similar in size, number and distribution to prior examinations, most compatible with tiny cysts and/or biliary hamartomas. In addition, there is a 6.4 x 5.9 cm low-attenuation nonenhancing lesion centered predominantly in segment 3 of the liver, compatible with a large simple cyst. No new aggressive appearing hepatic lesions are noted. No intra or extrahepatic biliary ductal dilatation. Gallbladder is normal in appearance. Pancreas: Previously noted mass in the body of the pancreas is difficult to discretely visualize on today's examination. However, when assessed on arterial phase images using narrow window settings, the mass is estimated to measure approximately 1.9 x 3.0 cm (axial image 38 of series 3). There continues to be atrophy throughout the tail of the pancreas where there is also extensive ductal dilatation, similar to prior examinations. This mass comes in contact with the adjacent splenic vein, however, the vein is widely patent at this time. This mass comes in close proximity to the superior mesenteric vein and the splenoportal confluence, however, there appears to be an intervening fat plane between both of these vessels. The mass is well separated from the superior mesenteric artery. No peripancreatic inflammatory changes or fluid collections. Spleen: Unremarkable. Adrenals/Urinary Tract: 5 mm nonobstructive  calculus in the interpolar collecting system of the right kidney. 1.7 cm simple cyst in the lower pole of the right kidney. Left kidney is normal in appearance. No hydroureteronephrosis in the visualized abdomen. Bilateral adrenal glands are normal in appearance. Stomach/Bowel: Normal appearance of the stomach, although the previously described mass in the body of the pancreas slightly obscures the fat plane between the pancreas and the adjacent body of the stomach (axial image 28 of series 4 and sagittal image 63 of series 15). No pathologic dilatation of visualized portions of the small bowel or colon. Vascular/Lymphatic: Aortic atherosclerosis, without evidence of aneurysm or dissection in the abdominal vasculature. Vascular findings relevant to pancreatic carcinoma, as above. No lymphadenopathy noted in the abdomen. Other: Tiny umbilical hernia containing omental fat. No significant volume of ascites and no pneumoperitoneum noted in the visualized portions of the peritoneal cavity. Musculoskeletal: There are no aggressive appearing lytic  or blastic lesions noted in the visualized portions of the skeleton. IMPRESSION: 1. Lesion in the body of the pancreas is not well demonstrated on today's CT examination, but appears similar in size to the most recent prior study, currently measuring approximately 3.0 x 1.9 cm. No definite evidence of metastatic disease in the abdomen. 2. Aortic atherosclerosis. 3. Nonobstructive 5 mm calculus in the interpolar collecting system of the right kidney. 4. Additional incidental findings, as above. Aortic Atherosclerosis (ICD10-I70.0). Electronically Signed   By: Vinnie Langton M.D.   On: 07/31/2016 15:40   Dg Chest Port 1 View  Result Date: 07/21/2016 CLINICAL DATA:  Cough, shortness of breath, pancreatic cancer EXAM: PORTABLE CHEST 1 VIEW COMPARISON:  06/08/2016 FINDINGS: Lungs are clear. Right apical pleural-parenchymal scarring. No pleural effusion or pneumothorax. The  heart is normal in size. Right chest port terminates at the cavoatrial junction. IMPRESSION: No evidence of acute cardiopulmonary disease. Electronically Signed   By: Julian Hy M.D.   On: 07/21/2016 15:34    Glennda Weatherholtz, DO  [facility] Pager [phone]  If 7PM-7AM, please contact night-coverage [email or web address] Password [email or web address] 08/10/2016, 2:26 PM   LOS: 1 day

## 2016-08-11 ENCOUNTER — Inpatient Hospital Stay (HOSPITAL_COMMUNITY): Payer: Medicare HMO

## 2016-08-11 ENCOUNTER — Telehealth (HOSPITAL_COMMUNITY): Payer: Self-pay | Admitting: Physician Assistant

## 2016-08-11 LAB — BASIC METABOLIC PANEL
Anion gap: 8 (ref 5–15)
BUN: 48 mg/dL — AB (ref 6–20)
CHLORIDE: 101 mmol/L (ref 101–111)
CO2: 23 mmol/L (ref 22–32)
CREATININE: 2.32 mg/dL — AB (ref 0.44–1.00)
Calcium: 8 mg/dL — ABNORMAL LOW (ref 8.9–10.3)
GFR calc non Af Amer: 20 mL/min — ABNORMAL LOW (ref >60)
GFR, EST AFRICAN AMERICAN: 24 mL/min — AB (ref >60)
GLUCOSE: 151 mg/dL — AB (ref 65–99)
Potassium: 5.3 mmol/L — ABNORMAL HIGH (ref 3.5–5.1)
Sodium: 132 mmol/L — ABNORMAL LOW (ref 135–145)

## 2016-08-11 LAB — GLUCOSE, CAPILLARY
GLUCOSE-CAPILLARY: 179 mg/dL — AB (ref 65–99)
GLUCOSE-CAPILLARY: 142 mg/dL — AB (ref 65–99)
Glucose-Capillary: 148 mg/dL — ABNORMAL HIGH (ref 65–99)
Glucose-Capillary: 122 mg/dL — ABNORMAL HIGH (ref 65–99)

## 2016-08-11 LAB — URINALYSIS, COMPLETE (UACMP) WITH MICROSCOPIC
BILIRUBIN URINE: NEGATIVE
Glucose, UA: NEGATIVE mg/dL
Hgb urine dipstick: NEGATIVE
KETONES UR: NEGATIVE mg/dL
Nitrite: NEGATIVE
PH: 5 (ref 5.0–8.0)
PROTEIN: NEGATIVE mg/dL
Specific Gravity, Urine: 1.016 (ref 1.005–1.030)

## 2016-08-11 LAB — PROCALCITONIN: Procalcitonin: <0.1 ng/mL

## 2016-08-11 MED ORDER — DILTIAZEM HCL ER COATED BEADS 240 MG PO CP24
240.0000 mg | ORAL_CAPSULE | Freq: Every day | ORAL | Status: DC
  Administered 2016-08-11 – 2016-08-14 (×4): 240 mg via ORAL
  Filled 2016-08-11 (×4): qty 1

## 2016-08-11 MED ORDER — MAGNESIUM SULFATE 2 GM/50ML IV SOLN
2.0000 g | Freq: Once | INTRAVENOUS | Status: AC
Start: 2016-08-11 — End: 2016-08-11
  Administered 2016-08-11: 2 g via INTRAVENOUS
  Filled 2016-08-11: qty 50

## 2016-08-11 MED ORDER — BUDESONIDE 0.5 MG/2ML IN SUSP
0.5000 mg | Freq: Two times a day (BID) | RESPIRATORY_TRACT | Status: DC
  Administered 2016-08-11 – 2016-08-14 (×6): 0.5 mg via RESPIRATORY_TRACT
  Filled 2016-08-11 (×6): qty 2

## 2016-08-11 MED ORDER — SODIUM CHLORIDE 0.9 % IV SOLN
INTRAVENOUS | Status: AC
  Administered 2016-08-11: 21:00:00 via INTRAVENOUS

## 2016-08-11 NOTE — Evaluation (Signed)
Physical Therapy Evaluation Patient Details Name: Lindsay Salazar MRN: [id number] DOB: 1950-04-30 Today's Date: 08/11/2016   History of Present Illness  66 yo white female with onset of acute COPD flare, atypical chest pain, a-fib and possibly with PNA.  PMHx:  CKD 3, COPD, fibromyalgia, HTN, DVT, O2 at home, heart murmur.  Clinical Impression  Pt is planning to get home after the hospital, but have determined she is too weak for this unless 24/7 caregivers with ability to assist her to the chair are there.  She is scheduled to continue with acute therapy, progressing as her O2 sats and breathing allow.  Her pulses are fairly stable but her sats dropped as low as 81%with talking and sitting bedside.  Will progress her strengthening as able, standing and controlled gait as she is tolerant of activity.  Monitor all vitals during therapy.    Follow Up Recommendations SNF    Equipment Recommendations  None recommended by PT    Recommendations for Other Services       Precautions / Restrictions Precautions Precautions: Fall Precaution Comments: telemetry, O2 sats decline with talking Restrictions Weight Bearing Restrictions: No      Mobility  Bed Mobility Overal bed mobility: Needs Assistance Bed Mobility: Supine to Sit;Sit to Supine     Supine to sit: Min assist;Mod assist Sit to supine: Mod assist   General bed mobility comments: assistance with trunk and scooting hips to EOB and then to pivot back to bed  Transfers Overall transfer level: Needs assistance               General transfer comment: declined to attempt, feeling bad from 3 nights without sleep per pt  Ambulation/Gait             General Gait Details: not yet attempted  Stairs            Wheelchair Mobility    Modified Rankin (Stroke Patients Only)       Balance Overall balance assessment: Needs assistance Sitting-balance support: Feet supported;Bilateral upper extremity supported Sitting  balance-Leahy Scale: Fair     Standing balance support:  (not attempted due to poor sleep and fatigue)                                 Pertinent Vitals/Pain Pain Assessment: No/denies pain    Home Living Family/patient expects to be discharged to:: Private residence Living Arrangements: Spouse/significant other Available Help at Discharge: Available PRN/intermittently Type of Home: House Home Access: Stairs to enter Entrance Stairs-Rails: None Entrance Stairs-Number of Steps: 3 Home Layout: One level Home Equipment: Clinical cytogeneticist - 2 wheels;Bedside commode;Wheelchair - manual      Prior Function Level of Independence: Needs assistance   Gait / Transfers Assistance Needed: wc for community, uses SPC and RW in the house  ADL's / Homemaking Assistance Needed: has been able to make some effort with AD's        Hand Dominance   Dominant Hand: Right    Extremity/Trunk Assessment   Upper Extremity Assessment Upper Extremity Assessment: Generalized weakness    Lower Extremity Assessment Lower Extremity Assessment: Generalized weakness    Cervical / Trunk Assessment Cervical / Trunk Assessment: Normal  Communication   Communication: No difficulties  Cognition Arousal/Alertness: Awake/alert Behavior During Therapy: WFL for tasks assessed/performed Overall Cognitive Status: Within Functional Limits for tasks assessed  General Comments      Exercises     Assessment/Plan    PT Assessment Patient needs continued PT services  PT Problem List Decreased strength;Decreased range of motion;Decreased activity tolerance;Decreased balance;Decreased mobility;Decreased coordination;Decreased knowledge of use of DME;Decreased safety awareness;Cardiopulmonary status limiting activity       PT Treatment Interventions DME instruction;Gait training;Stair training;Functional mobility training;Balance  training;Therapeutic exercise;Therapeutic activities;Neuromuscular re-education;Patient/family education    PT Goals (Current goals can be found in the Care Plan section)  Acute Rehab PT Goals Patient Stated Goal: to get walking again PT Goal Formulation: With patient Time For Goal Achievement: 08/25/16 Potential to Achieve Goals: Good    Frequency Min 2X/week   Barriers to discharge Inaccessible home environment has stairs and cannot currently sit and talk without decreasing O2 sats    Co-evaluation               AM-PAC PT "6 Clicks" Daily Activity  Outcome Measure Difficulty turning over in bed (including adjusting bedclothes, sheets and blankets)?: A Lot Difficulty moving from lying on back to sitting on the side of the bed? : A Lot Difficulty sitting down on and standing up from a chair with arms (e.g., wheelchair, bedside commode, etc,.)?: A Lot Help needed moving to and from a bed to chair (including a wheelchair)?: A Lot Help needed walking in hospital room?: Total Help needed climbing 3-5 steps with a railing? : Total 6 Click Score: 10    End of Session Equipment Utilized During Treatment: Oxygen Activity Tolerance: Treatment limited secondary to medical complications (Comment) Patient left: in bed;with call bell/phone within reach;with bed alarm set Nurse Communication: Mobility status PT Visit Diagnosis: Difficulty in walking, not elsewhere classified (R26.2);Muscle weakness (generalized) (M62.81)    Time: 0814-4818 PT Time Calculation (min) (ACUTE ONLY): 20 min   Charges:   PT Evaluation $PT Eval Moderate Complexity: 1 Procedure     PT G Codes:   PT G-Codes **NOT FOR INPATIENT CLASS** Functional Assessment Tool Used: AM-PAC 6 Clicks Basic Mobility   Ramond Dial 08/11/2016, 4:02 PM   4:06 PM, 08/11/16 Mee Hives, PT, MS Physical Therapist - [place] [phone] (Office)

## 2016-08-11 NOTE — Care Management Note (Signed)
Case Management Note  Patient Details  Name: Lindsay Salazar MRN: [id number] Date of Birth: 1950/08/30  Subjective/Objective:   Chart reviewed. Patient adm from home with Acute exacerbation of COPD. From home with spouse. Has home oxygen. Recent stay at [facility]. [place] home last admission with OP PT referral as patient continually declines home health visits due to her living conditions.           Action/Plan: PT consult pending. CM following.   Expected Discharge Date:  08/11/16               Expected Discharge Plan:  Home/Self Care  In-House Referral:     Discharge planning Services  CM Consult  Post Acute Care Choice:    Choice offered to:     DME Arranged:    DME Agency:     HH Arranged:    HH Agency:     Status of Service:  In process, will continue to follow  If discussed at Long Length of Stay Meetings, dates discussed:    Additional Comments:  Marena Witts, Chauncey Reading, RN 08/11/2016, 12:56 PM

## 2016-08-11 NOTE — Telephone Encounter (Signed)
08/11/16 gentleman called to cx he said that she was in the hospital

## 2016-08-11 NOTE — Progress Notes (Signed)
PROGRESS NOTE  Lindsay Salazar SWN:[id number] DOB: 08/12/1950 DOA: 08/09/2016 PCP: [name], PA-C  Brief History:  66 y.o.femalewith medical history of chronic respiratory failure on 2 L, COPD, chronic atrial fibrillation, and pancreatic cancer presented with one-day history of worsening shortness of breath and coughing with white sputum. The patient was using her nebulizers without much success. In addition, the patient has had intermittent chest discomfort for the past 2-3 days. She describes it as a"fluttering that comes and goes"on her left side of the chest. She denies any recent injury or falls. She states that the chest discomfort lasted a few minutes and subsided spontaneously. There is no exacerbating or alleviating factors.  She complains of loose stools in the past 1-2 days. She states that she has had 5 loose bowel movements in the past 24 hours prior to admission without any hematochezia or melena. She denies any new antibiotics. She denies any recent travels raw or undercooked foods. She denies any sick contacts.  Assessment/Plan: COPD Exacerbation -Continue IV Solu-Medrol -DuoNeb every 6 hours -Continue Pulmicort--increase to 0.5 mg bid -still wheezing and dyspneic with exertion -remains on 2 L   Atrial fibrillation with RVR -Precipitated by COPD exacerbation -Transition diltiazem drip to po diltiazem-->transition to long acting agent -TSH--4.405 -05/30/2016 echo in the 60-65%, no WMA, moderate TR -Continue Xarelto -CHADSVASc = 3  Acute on chronic renal failure CKD stage 3 -likely due to hemodynamic changes -d/c lasix and KCl supplement -renal ultrasound--neg hydronephrosis -serum creatinine hopeful to reach plateau in next 24 hours -check UA -baseline creatinine 0.8-1.2 -am BMP -plan IVF another 12 hours  Atypical chest pain -no angina -cycle troponins--neg x 3 -personally reviewed EKG--no ST-T changes  Diarrhea -pt endorsed 5 loose BM in  past 24 hours prior to admission -no diarrhea since admission -d/c stool studies  Hyperglycemia/Impaired glucose tolerance -elevation due to steroids -start novolog sliding scale  Chronic respiratory failure with hypoxia - on 2 L at home -Continue bronchodilators  Hypertension -Controlled -Continue diltiazem and metoprolol tartrate  Pancreatic adenocarcinoma -S/P EUS by Dr. Ardis Hughs on 11/29/2015 with FNA demonstrating adenocarcinoma -re-evaluated for distal pancreatectomy by Dr. Barry Dienes on 03/19/2016.  -CA 19-9 elevated initiation of treatment at 197 -Started on gemcitabine/Abraxane beginning on 93/81/8299  -Course complicated by multiple hospitalizations unrelated to her malignancy or treatment -last treatment was on 05/15/2016 with Gemcitabine/Abraxane -Medonc planning restaging MRI abd and if stable, refer back to Dr. Barry Dienes -has pulm appointment 7/13.  Deconditioning -PT eval  Hypomagnesemia -replete -am Mag   Disposition Plan:   Home in 1-2 days if renal function improves Family Communication:  No Family at bedside  Consultants:  none  Code Status:  FULL  DVT Prophylaxis:  rivaroxaban   Procedures: As Listed in Progress Note Above  Antibiotics: None   Subjective: Patient denies fevers, chills, headache, chest pain, dyspnea, nausea, vomiting, diarrhea, abdominal pain, dysuria, hematuria, hematochezia, and melena.  She is breathing better but still complains of dyspnea on exertion.   Objective: Vitals:   08/11/16 1100 08/11/16 1200 08/11/16 1300 08/11/16 1317  BP: 123/76 122/84    Pulse: 88 82 73   Resp: 18 (!) 21 (!) 21   Temp: 97.6 F (36.4 C)     TempSrc: Oral     SpO2: (!) 89% 96% 96% 97%  Weight:      Height:        Intake/Output Summary (Last 24 hours) at 08/11/16 1559 Last data  filed at 08/11/16 1300  Gross per 24 hour  Intake          2560.33 ml  Output                0 ml  Net          2560.33 ml   Weight change: -6.482  kg (-14 lb 4.6 oz) Exam:   General:  Pt is alert, follows commands appropriately, not in acute distress  HEENT: No icterus, No thrush, No neck mass, [place]/AT  Cardiovascular: RRR, S1/S2, no rubs, no gallops  Respiratory: Bibasilar rales. Bilateral expiratory wheeze. Good movement.  Abdomen: Soft/+BS, non tender, non distended, no guarding  Extremities: No edema, No lymphangitis, No petechiae, No rashes, no synovitis   Data Reviewed: I have personally reviewed following labs and imaging studies Basic Metabolic Panel:  Recent Labs Lab 08/05/16 1444 08/09/16 1320 08/10/16 0045 08/11/16 0557  NA 133* 140 132* 132*  K 3.7 4.0 4.7 5.3*  CL 101 103 97* 101  CO2 22 26 21* 23  GLUCOSE 131* 116* 252* 151*  BUN 30* 18 27* 48*  CREATININE 1.10* 1.12* 1.92* 2.32*  CALCIUM 8.5* 9.2 8.6* 8.0*  MG 1.6*  --  1.3*  --   PHOS 2.5  --   --   --    Liver Function Tests:  Recent Labs Lab 08/05/16 1444  AST 17  ALT 14  ALKPHOS 81  BILITOT 0.6  PROT 6.2*  ALBUMIN 3.2*   No results for input(s): LIPASE, AMYLASE in the last 168 hours. No results for input(s): AMMONIA in the last 168 hours. Coagulation Profile: No results for input(s): INR, PROTIME in the last 168 hours. CBC:  Recent Labs Lab 08/05/16 1444 08/09/16 1320  WBC 12.6* 9.5  NEUTROABS 9.0*  --   HGB 10.9* 12.3  HCT 34.8* 38.6  MCV 95.1 92.1  PLT 184 198   Cardiac Enzymes:  Recent Labs Lab 08/09/16 1320 08/09/16 2011 08/10/16 0045  TROPONINI <0.03 <0.03 <0.03   BNP: Invalid input(s): POCBNP CBG:  Recent Labs Lab 08/10/16 1607 08/10/16 2146 08/11/16 0759 08/11/16 1134  GLUCAP 163* 207* 148* 179*   HbA1C: No results for input(s): HGBA1C in the last 72 hours. Urine analysis:    Component Value Date/Time   COLORURINE YELLOW 06/03/2016 2100   APPEARANCEUR CLEAR 06/03/2016 2100   LABSPEC 1.025 06/03/2016 2100   PHURINE 5.0 06/03/2016 2100   GLUCOSEU 50 (A) 06/03/2016 2100   HGBUR NEGATIVE  06/03/2016 2100   [place] NEGATIVE 06/03/2016 2100   [place] NEGATIVE 06/03/2016 2100   PROTEINUR NEGATIVE 06/03/2016 2100   NITRITE NEGATIVE 06/03/2016 2100   LEUKOCYTESUR NEGATIVE 06/03/2016 2100   Sepsis Labs: @LABRCNTIP (procalcitonin:4,lacticidven:4) ) Recent Results (from the past 240 hour(s))  MRSA PCR Screening     Status: None   Collection Time: 08/09/16  3:49 PM  Result Value Ref Range Status   MRSA by PCR NEGATIVE NEGATIVE Final    Comment:        The GeneXpert MRSA Assay (FDA approved for NASAL specimens only), is one component of a comprehensive MRSA colonization surveillance program. It is not intended to diagnose MRSA infection nor to guide or monitor treatment for MRSA infections.   Gastrointestinal Panel by PCR , Stool     Status: None   Collection Time: 08/09/16  5:40 PM  Result Value Ref Range Status   Campylobacter species NOT DETECTED NOT DETECTED Final   Plesimonas shigelloides NOT DETECTED NOT DETECTED Final  Salmonella species NOT DETECTED NOT DETECTED Final   Yersinia enterocolitica NOT DETECTED NOT DETECTED Final   Vibrio species NOT DETECTED NOT DETECTED Final   Vibrio cholerae NOT DETECTED NOT DETECTED Final   Enteroaggregative E coli (EAEC) NOT DETECTED NOT DETECTED Final   Enteropathogenic E coli (EPEC) NOT DETECTED NOT DETECTED Final   Enterotoxigenic E coli (ETEC) NOT DETECTED NOT DETECTED Final   Shiga like toxin producing E coli (STEC) NOT DETECTED NOT DETECTED Final   Shigella/Enteroinvasive E coli (EIEC) NOT DETECTED NOT DETECTED Final   Cryptosporidium NOT DETECTED NOT DETECTED Final   Cyclospora cayetanensis NOT DETECTED NOT DETECTED Final   Entamoeba histolytica NOT DETECTED NOT DETECTED Final   Giardia lamblia NOT DETECTED NOT DETECTED Final   Adenovirus F40/41 NOT DETECTED NOT DETECTED Final   Astrovirus NOT DETECTED NOT DETECTED Final   Norovirus GI/GII NOT DETECTED NOT DETECTED Final   Rotavirus A NOT DETECTED NOT  DETECTED Final   Sapovirus (I, II, IV, and V) NOT DETECTED NOT DETECTED Final     Scheduled Meds: . budesonide (PULMICORT) nebulizer solution  0.25 mg Nebulization BID  . diltiazem  60 mg Oral Q6H  . docusate sodium  100 mg Oral BID  . escitalopram  10 mg Oral Daily  . insulin aspart  0-9 Units Subcutaneous TID WC  . ipratropium-albuterol  3 mL Nebulization Q6H  . methylPREDNISolone (SOLU-MEDROL) injection  60 mg Intravenous Q8H  . metoprolol tartrate  50 mg Oral BID  . pantoprazole  40 mg Oral Daily  . Rivaroxaban  15 mg Oral Q supper  . temazepam  15 mg Oral QHS  . [START ON 08/14/2016] Vitamin D (Ergocalciferol)  50,000 Units Oral Q7 days   Continuous Infusions:  Procedures/Studies: Dg Chest 2 View  Result Date: 08/09/2016 CLINICAL DATA:  Chest pain EXAM: CHEST  2 VIEW COMPARISON:  07/21/2016 FINDINGS: Lungs are markedly hyperinflated. Hazy opacity identified over the lateral left upper lobe potentially related to superimposition of soft tissue, but pneumonia not excluded. Lateral film suggests airspace disease in the right middle lobe. The cardiopericardial silhouette is within normal limits for size. Right Port-A-Cath remains in place. The visualized bony structures of the thorax are intact. Telemetry leads overlie the chest. IMPRESSION: Hyperexpansion with question of left upper lobe airspace opacity raising the question of pneumonia. Lateral film noted with opacity over the heart as can be seen in cases of right middle lobe airspace disease. Electronically Signed   By: Misty Stanley M.D.   On: 08/09/2016 14:05   US Renal  Result Date: 08/11/2016 CLINICAL DATA:  Acute kidney injury.  Pancreatic cancer. EXAM: RENAL / URINARY TRACT ULTRASOUND COMPLETE COMPARISON:  CT 07/31/2016 FINDINGS: Right Kidney: Length: 10.2 cm. Cortical thinning. 5 mm shadowing stone in the midpole. 1.6 cm lower pole cyst. This appears benign. No hydronephrosis. Normal echotexture. Left Kidney: Length: 9.3 cm. Mild  cortical thinning. Normal echotexture. No hydronephrosis or mass. Bladder: Appears normal for degree of bladder distention. IMPRESSION: No hydronephrosis.  Mild cortical thinning bilaterally. Small nonobstructing right midpole stone. Electronically Signed   By: Rolm Baptise M.D.   On: 08/11/2016 10:26   Ct Abdomen W Wo Contrast  Result Date: 07/31/2016 CLINICAL DATA:  66 year old female with history of pancreatic cancer. Restaging examination. EXAM: CT ABDOMEN WITHOUT AND WITH CONTRAST TECHNIQUE: Multidetector CT imaging of the abdomen was performed following the standard protocol before and following the bolus administration of intravenous contrast. CONTRAST:  20mL ISOVUE-300 IOPAMIDOL (ISOVUE-300) INJECTION 61% COMPARISON:  MRI  of the abdomen 03/12/2016. CT of the abdomen and pelvis 11/27/2015. FINDINGS: Lower chest: Unremarkable. Hepatobiliary: Multiple tiny subcentimeter low-attenuation lesions are again noted scattered throughout the hepatic parenchyma, similar in size, number and distribution to prior examinations, most compatible with tiny cysts and/or biliary hamartomas. In addition, there is a 6.4 x 5.9 cm low-attenuation nonenhancing lesion centered predominantly in segment 3 of the liver, compatible with a large simple cyst. No new aggressive appearing hepatic lesions are noted. No intra or extrahepatic biliary ductal dilatation. Gallbladder is normal in appearance. Pancreas: Previously noted mass in the body of the pancreas is difficult to discretely visualize on today's examination. However, when assessed on arterial phase images using narrow window settings, the mass is estimated to measure approximately 1.9 x 3.0 cm (axial image 38 of series 3). There continues to be atrophy throughout the tail of the pancreas where there is also extensive ductal dilatation, similar to prior examinations. This mass comes in contact with the adjacent splenic vein, however, the vein is widely patent at this time.  This mass comes in close proximity to the superior mesenteric vein and the splenoportal confluence, however, there appears to be an intervening fat plane between both of these vessels. The mass is well separated from the superior mesenteric artery. No peripancreatic inflammatory changes or fluid collections. Spleen: Unremarkable. Adrenals/Urinary Tract: 5 mm nonobstructive calculus in the interpolar collecting system of the right kidney. 1.7 cm simple cyst in the lower pole of the right kidney. Left kidney is normal in appearance. No hydroureteronephrosis in the visualized abdomen. Bilateral adrenal glands are normal in appearance. Stomach/Bowel: Normal appearance of the stomach, although the previously described mass in the body of the pancreas slightly obscures the fat plane between the pancreas and the adjacent body of the stomach (axial image 28 of series 4 and sagittal image 63 of series 15). No pathologic dilatation of visualized portions of the small bowel or colon. Vascular/Lymphatic: Aortic atherosclerosis, without evidence of aneurysm or dissection in the abdominal vasculature. Vascular findings relevant to pancreatic carcinoma, as above. No lymphadenopathy noted in the abdomen. Other: Tiny umbilical hernia containing omental fat. No significant volume of ascites and no pneumoperitoneum noted in the visualized portions of the peritoneal cavity. Musculoskeletal: There are no aggressive appearing lytic or blastic lesions noted in the visualized portions of the skeleton. IMPRESSION: 1. Lesion in the body of the pancreas is not well demonstrated on today's CT examination, but appears similar in size to the most recent prior study, currently measuring approximately 3.0 x 1.9 cm. No definite evidence of metastatic disease in the abdomen. 2. Aortic atherosclerosis. 3. Nonobstructive 5 mm calculus in the interpolar collecting system of the right kidney. 4. Additional incidental findings, as above. Aortic  Atherosclerosis (ICD10-I70.0). Electronically Signed   By: Vinnie Langton M.D.   On: 07/31/2016 15:40   Dg Chest Port 1 View  Result Date: 07/21/2016 CLINICAL DATA:  Cough, shortness of breath, pancreatic cancer EXAM: PORTABLE CHEST 1 VIEW COMPARISON:  06/08/2016 FINDINGS: Lungs are clear. Right apical pleural-parenchymal scarring. No pleural effusion or pneumothorax. The heart is normal in size. Right chest port terminates at the cavoatrial junction. IMPRESSION: No evidence of acute cardiopulmonary disease. Electronically Signed   By: Julian Hy M.D.   On: 07/21/2016 15:34    Alaysha Jefcoat, DO  [facility] Pager [phone]  If 7PM-7AM, please contact night-coverage [email or web address] Password [email or web address] 08/11/2016, 3:59 PM   LOS: 2 days

## 2016-08-12 ENCOUNTER — Ambulatory Visit (HOSPITAL_COMMUNITY): Payer: Medicare HMO | Admitting: Physical Therapy

## 2016-08-12 LAB — GLUCOSE, CAPILLARY
GLUCOSE-CAPILLARY: 158 mg/dL — AB (ref 65–99)
GLUCOSE-CAPILLARY: 148 mg/dL — AB (ref 65–99)
GLUCOSE-CAPILLARY: 158 mg/dL — AB (ref 65–99)
GLUCOSE-CAPILLARY: 161 mg/dL — AB (ref 65–99)

## 2016-08-12 LAB — BASIC METABOLIC PANEL
ANION GAP: 6 (ref 5–15)
BUN: 57 mg/dL — ABNORMAL HIGH (ref 6–20)
CHLORIDE: 103 mmol/L (ref 101–111)
CO2: 22 mmol/L (ref 22–32)
Calcium: 7.9 mg/dL — ABNORMAL LOW (ref 8.9–10.3)
Creatinine, Ser: 2.03 mg/dL — ABNORMAL HIGH (ref 0.44–1.00)
GFR calc non Af Amer: 24 mL/min — ABNORMAL LOW (ref >60)
GFR, EST AFRICAN AMERICAN: 28 mL/min — AB (ref >60)
Glucose, Bld: 136 mg/dL — ABNORMAL HIGH (ref 65–99)
POTASSIUM: 4.9 mmol/L (ref 3.5–5.1)
SODIUM: 131 mmol/L — AB (ref 135–145)

## 2016-08-12 LAB — MAGNESIUM: MAGNESIUM: 2.3 mg/dL (ref 1.7–2.4)

## 2016-08-12 MED ORDER — LEVOFLOXACIN 500 MG PO TABS
500.0000 mg | ORAL_TABLET | ORAL | Status: DC
  Administered 2016-08-12: 500 mg via ORAL
  Filled 2016-08-12 (×2): qty 1

## 2016-08-12 MED ORDER — ARFORMOTEROL TARTRATE 15 MCG/2ML IN NEBU
15.0000 ug | INHALATION_SOLUTION | Freq: Two times a day (BID) | RESPIRATORY_TRACT | Status: DC
  Administered 2016-08-12 – 2016-08-14 (×4): 15 ug via RESPIRATORY_TRACT
  Filled 2016-08-12 (×4): qty 2

## 2016-08-12 NOTE — Plan of Care (Signed)
Problem: Activity: Goal: Ability to tolerate increased activity will improve Outcome: Progressing Up with assistance in room, ambulates in room with standby assist. Reports generalized weakness with activity.

## 2016-08-12 NOTE — Clinical Social Work Note (Signed)
Patient states that she currently continues to reside with her boyfriend and his brother.  LCSW discussed PT recommendation of SNF. Patient is not agreeable to SNF.  She is agreeable to HHPT. She states that HHPT will have to sit on the porch and she will come out and meet them on the porch.  Patient states that her home is currently in need of repairs and [facility] agencies do not like to come to her house.  Patient's boyfriend, Lindsay Salazar, states that they are in the process of looking for new housing in better condition.  LCSW signing off.   Lindsay Salazar, [name], LCSW

## 2016-08-12 NOTE — Progress Notes (Signed)
Pt up to [facility] with stand by assist. No c/o shortness of breath while up to [facility]. Pt did not need any help to get OOB to [facility]. Will continue to monitor pt

## 2016-08-12 NOTE — Plan of Care (Signed)
Problem: Pain Managment: Goal: General experience of comfort will improve Outcome: Adequate for Discharge Pt c/o toe pain and L hip pain, given pain medication x1 so far this shift. Pt can have pain medication q6h PRN. After receiving pain medication she rates her pain at 4/10, before pain medication 10/10. Pt educated on pain mgmt, medications available and times. Pt verbalized understanding. Will continue to monitor pt

## 2016-08-12 NOTE — Care Management Note (Signed)
Case Management Note  Patient Details  Name: Lindsay Salazar MRN: [id number] Date of Birth: 20-Jan-1951  Expected Discharge Date:  08/11/16               Expected Discharge Plan:  [place]  In-House Referral:  Clinical Social Work  Discharge planning Services  CM Consult  Post Acute Care Choice:  Home Health Choice offered to:  Patient  HH Arranged:  RN [facility] Agency:     Status of Service:  In process, will continue to follow   Additional Comments: Pt now agreeable to [facility]. She has used [facility] in the past and would like them again. Approval has been given for [place]. Vaughan Basta, [facility] rep, made aware of referral and states [facility] may not accept pt back. She is discussing with management and will notify CM when decision has been made.   Sherald Barge, RN 08/12/2016, 2:40 PM

## 2016-08-12 NOTE — Progress Notes (Signed)
PROGRESS NOTE  Lindsay Salazar OIB:[id number] DOB: [birth year] DOA: 08/09/2016 PCP: [name], PA-C  Brief History: 66 y.o.femalewith medical history of chronic respiratory failure on 2 L, COPD, chronic atrial fibrillation, and pancreatic cancer presented with one-day history of worsening shortness of breath and coughing with white sputum. The patient was using her nebulizers without much success. In addition, the patient has had intermittent chest discomfort for the past 2-3 days. She describes it as a"fluttering that comes and goes"on her left side of the chest. She denies any recent injury or falls. She states that the chest discomfort lasted a few minutes and subsided spontaneously. There is no exacerbating or alleviating factors. She complains of loose stools in the past 1-2 days. She states that she has had 5 loose bowel movements in the past 24 hours prior to admission without any hematochezia or melena. She denies any new antibiotics. She denies any recent travels raw or undercooked foods. She denies any sick contacts.  Assessment/Plan: COPD Exacerbation -Continue IV Solu-Medrol -DuoNeb every 6 hours -ContinuePulmicort--increased to 0.5 mg bid -08/12/16--add Brovana -still wheezing and dyspneic with minimal exertion -remains on 2.5 L [place] -add levofloxacin  Atrial fibrillation with RVR -Precipitated by COPD exacerbation -Transitiondiltiazem drip to po diltiazem-->transition to long acting agent -now rate controlled -TSH--4.405 -05/30/2016 echo in the 60-65%, no WMA, moderate TR -Continue Xarelto -CHADSVASc = 3  Acute on chronic renal failure CKD stage 3 -likely due to hemodynamic changes -d/c lasix and KCl supplement -renal ultrasound--neg hydronephrosis -serum creatinine plateaued and now starting to improve -baseline creatinine 0.8-1.2 -am BMP  Pyuria -UA--TNTC WBC -start levofloxacin pending culture data  Atypical chest pain -no angina -cycle  troponins--neg x 3 -personally reviewed EKG--no ST-T changes  Diarrhea -pt endorsed 5 loose BM in past 24 hours prior to admission -no diarrhea since admission -d/c stool studies  Hyperglycemia/Impaired glucose tolerance -elevation due to steroids -start novolog sliding scale  Chronic respiratory failure with hypoxia - on 2 L at home -Continue bronchodilators  Hypertension -Controlled -Continue diltiazem and metoprolol tartrate  Pancreatic adenocarcinoma -S/P EUS by Dr. Ardis Hughs on 11/29/2015 with FNA demonstrating adenocarcinoma -re-evaluated for distal pancreatectomy by Dr. Barry Dienes on 03/19/2016.  -CA 19-9 elevated initiation of treatment at 197 -Started on gemcitabine/Abraxane beginning on 94/50/3888  -Course complicated by multiple hospitalizations unrelated to her malignancy or treatment -last treatment was on 05/15/2016 with Gemcitabine/Abraxane -Medonc planning restaging MRI abd and if stable, refer back to Dr. Barry Dienes -has pulm appointment 7/13.  Deconditioning -PT eval-->SNF  Hypomagnesemia -replete -am Mag--2.3  Subjective: Patient states the breathing is much better than yesterday. She has dyspn denies any fevers, chills, headache. She has a cough with yellow sputum. Denies any hemoptysis.ea with minimal exertion. Denies any nausea, vomiting, diarrhea, femoral pain.  Objective: Vitals:   08/12/16 0811 08/12/16 0846 08/12/16 1301 08/12/16 1346  BP:   (!) 128/58   Pulse:   70   Resp:   16   Temp:   97.6 F (36.4 C)   TempSrc:   Oral   SpO2: 93% 92% 96% 92%  Weight:      Height:        Intake/Output Summary (Last 24 hours) at 08/12/16 1537 Last data filed at 08/12/16 1453  Gross per 24 hour  Intake              360 ml  Output  925 ml  Net             -565 ml   Weight change: 2.218 kg (4 lb 14.2 oz) Exam:   General:  Pt is alert, follows commands appropriately, not in acute distress  HEENT: No icterus, No thrush, No neck mass,  [place]/AT  Cardiovascular: IRRR, S1/S2, no rubs, no gallops  Respiratory: Bilateral expiratory wheezes. Bibasilar rales. Good air movement.   Abdomen: Soft/+BS, non tender, non distended, no guarding  Extremities: No edema, No lymphangitis, No petechiae, No rashes, no synovitis   Data Reviewed: I have personally reviewed following labs and imaging studies Basic Metabolic Panel:  Recent Labs Lab 08/09/16 1320 08/10/16 0045 08/11/16 0557 08/12/16 0615  NA 140 132* 132* 131*  K 4.0 4.7 5.3* 4.9  CL 103 97* 101 103  CO2 26 21* 23 22  GLUCOSE 116* 252* 151* 136*  BUN 18 27* 48* 57*  CREATININE 1.12* 1.92* 2.32* 2.03*  CALCIUM 9.2 8.6* 8.0* 7.9*  MG  --  1.3*  --  2.3   Liver Function Tests: No results for input(s): AST, ALT, ALKPHOS, BILITOT, PROT, ALBUMIN in the last 168 hours. No results for input(s): LIPASE, AMYLASE in the last 168 hours. No results for input(s): AMMONIA in the last 168 hours. Coagulation Profile: No results for input(s): INR, PROTIME in the last 168 hours. CBC:  Recent Labs Lab 08/09/16 1320  WBC 9.5  HGB 12.3  HCT 38.6  MCV 92.1  PLT 198   Cardiac Enzymes:  Recent Labs Lab 08/09/16 1320 08/09/16 2011 08/10/16 0045  TROPONINI <0.03 <0.03 <0.03   BNP: Invalid input(s): POCBNP CBG:  Recent Labs Lab 08/11/16 1134 08/11/16 1619 08/11/16 2115 08/12/16 0749 08/12/16 1144  GLUCAP 179* 122* 142* 158* 148*   HbA1C: No results for input(s): HGBA1C in the last 72 hours. Urine analysis:    Component Value Date/Time   COLORURINE YELLOW 08/11/2016 1604   APPEARANCEUR HAZY (A) 08/11/2016 1604   LABSPEC 1.016 08/11/2016 1604   PHURINE 5.0 08/11/2016 1604   GLUCOSEU NEGATIVE 08/11/2016 1604   HGBUR NEGATIVE 08/11/2016 1604   BILIRUBINUR NEGATIVE 08/11/2016 1604   KETONESUR NEGATIVE 08/11/2016 1604   PROTEINUR NEGATIVE 08/11/2016 1604   NITRITE NEGATIVE 08/11/2016 1604   LEUKOCYTESUR LARGE (A) 08/11/2016 1604   Sepsis  Labs: @LABRCNTIP (procalcitonin:4,lacticidven:4) ) Recent Results (from the past 240 hour(s))  MRSA PCR Screening     Status: None   Collection Time: 08/09/16  3:49 PM  Result Value Ref Range Status   MRSA by PCR NEGATIVE NEGATIVE Final    Comment:        The GeneXpert MRSA Assay (FDA approved for NASAL specimens only), is one component of a comprehensive MRSA colonization surveillance program. It is not intended to diagnose MRSA infection nor to guide or monitor treatment for MRSA infections.   Gastrointestinal Panel by PCR , Stool     Status: None   Collection Time: 08/09/16  5:40 PM  Result Value Ref Range Status   Campylobacter species NOT DETECTED NOT DETECTED Final   Plesimonas shigelloides NOT DETECTED NOT DETECTED Final   Salmonella species NOT DETECTED NOT DETECTED Final   Yersinia enterocolitica NOT DETECTED NOT DETECTED Final   Vibrio species NOT DETECTED NOT DETECTED Final   Vibrio cholerae NOT DETECTED NOT DETECTED Final   Enteroaggregative E coli (EAEC) NOT DETECTED NOT DETECTED Final   Enteropathogenic E coli (EPEC) NOT DETECTED NOT DETECTED Final   Enterotoxigenic E coli (ETEC) NOT DETECTED NOT DETECTED  Final   Shiga like toxin producing E coli (STEC) NOT DETECTED NOT DETECTED Final   Shigella/Enteroinvasive E coli (EIEC) NOT DETECTED NOT DETECTED Final   Cryptosporidium NOT DETECTED NOT DETECTED Final   Cyclospora cayetanensis NOT DETECTED NOT DETECTED Final   Entamoeba histolytica NOT DETECTED NOT DETECTED Final   Giardia lamblia NOT DETECTED NOT DETECTED Final   Adenovirus F40/41 NOT DETECTED NOT DETECTED Final   Astrovirus NOT DETECTED NOT DETECTED Final   Norovirus GI/GII NOT DETECTED NOT DETECTED Final   Rotavirus A NOT DETECTED NOT DETECTED Final   Sapovirus (I, II, IV, and V) NOT DETECTED NOT DETECTED Final     Scheduled Meds: . arformoterol  15 mcg Nebulization BID  . budesonide (PULMICORT) nebulizer solution  0.5 mg Nebulization BID  . diltiazem   240 mg Oral Daily  . docusate sodium  100 mg Oral BID  . escitalopram  10 mg Oral Daily  . insulin aspart  0-9 Units Subcutaneous TID WC  . ipratropium-albuterol  3 mL Nebulization Q6H  . methylPREDNISolone (SOLU-MEDROL) injection  60 mg Intravenous Q8H  . metoprolol tartrate  50 mg Oral BID  . pantoprazole  40 mg Oral Daily  . Rivaroxaban  15 mg Oral Q supper  . temazepam  15 mg Oral QHS  . [START ON 08/14/2016] Vitamin D (Ergocalciferol)  50,000 Units Oral Q7 days   Continuous Infusions:  Procedures/Studies: Dg Chest 2 View  Result Date: 08/09/2016 CLINICAL DATA:  Chest pain EXAM: CHEST  2 VIEW COMPARISON:  07/21/2016 FINDINGS: Lungs are markedly hyperinflated. Hazy opacity identified over the lateral left upper lobe potentially related to superimposition of soft tissue, but pneumonia not excluded. Lateral film suggests airspace disease in the right middle lobe. The cardiopericardial silhouette is within normal limits for size. Right Port-A-Cath remains in place. The visualized bony structures of the thorax are intact. Telemetry leads overlie the chest. IMPRESSION: Hyperexpansion with question of left upper lobe airspace opacity raising the question of pneumonia. Lateral film noted with opacity over the heart as can be seen in cases of right middle lobe airspace disease. Electronically Signed   By: Misty Stanley M.D.   On: 08/09/2016 14:05   US Renal  Result Date: 08/11/2016 CLINICAL DATA:  Acute kidney injury.  Pancreatic cancer. EXAM: RENAL / URINARY TRACT ULTRASOUND COMPLETE COMPARISON:  CT 07/31/2016 FINDINGS: Right Kidney: Length: 10.2 cm. Cortical thinning. 5 mm shadowing stone in the midpole. 1.6 cm lower pole cyst. This appears benign. No hydronephrosis. Normal echotexture. Left Kidney: Length: 9.3 cm. Mild cortical thinning. Normal echotexture. No hydronephrosis or mass. Bladder: Appears normal for degree of bladder distention. IMPRESSION: No hydronephrosis.  Mild cortical thinning  bilaterally. Small nonobstructing right midpole stone. Electronically Signed   By: Rolm Baptise M.D.   On: 08/11/2016 10:26   Ct Abdomen W Wo Contrast  Result Date: 07/31/2016 CLINICAL DATA:  66 year old female with history of pancreatic cancer. Restaging examination. EXAM: CT ABDOMEN WITHOUT AND WITH CONTRAST TECHNIQUE: Multidetector CT imaging of the abdomen was performed following the standard protocol before and following the bolus administration of intravenous contrast. CONTRAST:  39mL ISOVUE-300 IOPAMIDOL (ISOVUE-300) INJECTION 61% COMPARISON:  MRI of the abdomen 03/12/2016. CT of the abdomen and pelvis 11/27/2015. FINDINGS: Lower chest: Unremarkable. Hepatobiliary: Multiple tiny subcentimeter low-attenuation lesions are again noted scattered throughout the hepatic parenchyma, similar in size, number and distribution to prior examinations, most compatible with tiny cysts and/or biliary hamartomas. In addition, there is a 6.4 x 5.9 cm low-attenuation nonenhancing lesion  centered predominantly in segment 3 of the liver, compatible with a large simple cyst. No new aggressive appearing hepatic lesions are noted. No intra or extrahepatic biliary ductal dilatation. Gallbladder is normal in appearance. Pancreas: Previously noted mass in the body of the pancreas is difficult to discretely visualize on today's examination. However, when assessed on arterial phase images using narrow window settings, the mass is estimated to measure approximately 1.9 x 3.0 cm (axial image 38 of series 3). There continues to be atrophy throughout the tail of the pancreas where there is also extensive ductal dilatation, similar to prior examinations. This mass comes in contact with the adjacent splenic vein, however, the vein is widely patent at this time. This mass comes in close proximity to the superior mesenteric vein and the splenoportal confluence, however, there appears to be an intervening fat plane between both of these  vessels. The mass is well separated from the superior mesenteric artery. No peripancreatic inflammatory changes or fluid collections. Spleen: Unremarkable. Adrenals/Urinary Tract: 5 mm nonobstructive calculus in the interpolar collecting system of the right kidney. 1.7 cm simple cyst in the lower pole of the right kidney. Left kidney is normal in appearance. No hydroureteronephrosis in the visualized abdomen. Bilateral adrenal glands are normal in appearance. Stomach/Bowel: Normal appearance of the stomach, although the previously described mass in the body of the pancreas slightly obscures the fat plane between the pancreas and the adjacent body of the stomach (axial image 28 of series 4 and sagittal image 63 of series 15). No pathologic dilatation of visualized portions of the small bowel or colon. Vascular/Lymphatic: Aortic atherosclerosis, without evidence of aneurysm or dissection in the abdominal vasculature. Vascular findings relevant to pancreatic carcinoma, as above. No lymphadenopathy noted in the abdomen. Other: Tiny umbilical hernia containing omental fat. No significant volume of ascites and no pneumoperitoneum noted in the visualized portions of the peritoneal cavity. Musculoskeletal: There are no aggressive appearing lytic or blastic lesions noted in the visualized portions of the skeleton. IMPRESSION: 1. Lesion in the body of the pancreas is not well demonstrated on today's CT examination, but appears similar in size to the most recent prior study, currently measuring approximately 3.0 x 1.9 cm. No definite evidence of metastatic disease in the abdomen. 2. Aortic atherosclerosis. 3. Nonobstructive 5 mm calculus in the interpolar collecting system of the right kidney. 4. Additional incidental findings, as above. Aortic Atherosclerosis (ICD10-I70.0). Electronically Signed   By: Vinnie Langton M.D.   On: 07/31/2016 15:40   Dg Chest Port 1 View  Result Date: 07/21/2016 CLINICAL DATA:  Cough,  shortness of breath, pancreatic cancer EXAM: PORTABLE CHEST 1 VIEW COMPARISON:  06/08/2016 FINDINGS: Lungs are clear. Right apical pleural-parenchymal scarring. No pleural effusion or pneumothorax. The heart is normal in size. Right chest port terminates at the cavoatrial junction. IMPRESSION: No evidence of acute cardiopulmonary disease. Electronically Signed   By: Julian Hy M.D.   On: 07/21/2016 15:34    Adaleena Mooers, DO  [facility] Pager [phone]  If 7PM-7AM, please contact night-coverage [email or web address] Password [email or web address] 08/12/2016, 3:37 PM   LOS: 3 days

## 2016-08-12 NOTE — Progress Notes (Signed)
Physical Therapy Treatment Patient Details Name: Lindsay Salazar MRN: [id number] DOB: 29-Jul-1950 Today's Date: 08/12/2016    History of Present Illness 66 yo female with onset of acute COPD flare, atypical chest pain, a-fib and possibly with PNA.  PMHx:  CKD 3, COPD, fibromyalgia, HTN, DVT, O2 at home, heart murmur.    PT Comments    Pt is at a level of activity tolerance that is fairly low but has progressed enough to get out of ICU and to the floor.  Her plan is to demonstrate some tolerance for endurance as much as possible prior to getting to SNF, and to reduce the time to home given that she has help from her significant other.  Her plan includes monitoring vitals and will try to get up to chair tomorrow.    Follow Up Recommendations  SNF     Equipment Recommendations  None recommended by PT    Recommendations for Other Services       Precautions / Restrictions Precautions Precautions: Fall Precaution Comments: telemetry, watch O2 sats Restrictions Weight Bearing Restrictions: No    Mobility  Bed Mobility Overal bed mobility: Needs Assistance             General bed mobility comments: help to scoot up in bed and roll  Transfers                 General transfer comment: declined  Ambulation/Gait             General Gait Details: not able to attempt   Stairs            Wheelchair Mobility    Modified Rankin (Stroke Patients Only)       Balance                                            Cognition Arousal/Alertness: Awake/alert Behavior During Therapy: WFL for tasks assessed/performed Overall Cognitive Status: Within Functional Limits for tasks assessed                                        Exercises General Exercises - Lower Extremity Ankle Circles/Pumps: AAROM;Both;5 reps Quad Sets: AROM;Both;10 reps Gluteal Sets: AROM;Both;10 reps Heel Slides: AROM;Both;10 reps Hip ABduction/ADduction:  AROM;Both;10 reps Hip Flexion/Marching: AROM;Both;10 reps    General Comments General comments (skin integrity, edema, etc.): O2 sats were monitored for pt with 96% initially and 92% after bed exercise and talking      Pertinent Vitals/Pain Pain Assessment: No/denies pain    Home Living                      Prior Function            PT Goals (current goals can now be found in the care plan section) Progress towards PT goals: Progressing toward goals (SOB waiting RT and now progressed out to floor)    Frequency    Min 2X/week      PT Plan Current plan remains appropriate    Co-evaluation              AM-PAC PT "6 Clicks" Daily Activity  Outcome Measure  Difficulty turning over in bed (including adjusting bedclothes, sheets and blankets)?: A Lot Difficulty moving from lying on back  to sitting on the side of the bed? : A Lot Difficulty sitting down on and standing up from a chair with arms (e.g., wheelchair, bedside commode, etc,.)?: A Lot Help needed moving to and from a bed to chair (including a wheelchair)?: A Lot Help needed walking in hospital room?: Total Help needed climbing 3-5 steps with a railing? : Total 6 Click Score: 10    End of Session Equipment Utilized During Treatment: Oxygen Activity Tolerance: Patient limited by fatigue;Treatment limited secondary to medical complications (Comment) (SOB ) Patient left: in bed;with call bell/phone within reach;Other (comment) (respiratory therapist had just arrived) Nurse Communication: Mobility status PT Visit Diagnosis: Difficulty in walking, not elsewhere classified (R26.2);Muscle weakness (generalized) (M62.81)     Time: 4314-2767 PT Time Calculation (min) (ACUTE ONLY): 25 min  Charges:  $Therapeutic Exercise: 23-37 mins                    G Codes:  Functional Assessment Tool Used: AM-PAC 6 Clicks Basic Mobility   Ramond Dial 08/12/2016, 2:40 PM   2:44 PM, 08/12/16 Mee Hives, PT,  MS Physical Therapist - [place] [phone] (Office)

## 2016-08-13 DIAGNOSIS — N179 Acute kidney failure, unspecified: Secondary | ICD-10-CM

## 2016-08-13 LAB — PROCALCITONIN

## 2016-08-13 LAB — BASIC METABOLIC PANEL
Anion gap: 7 (ref 5–15)
BUN: 53 mg/dL — ABNORMAL HIGH (ref 6–20)
CHLORIDE: 102 mmol/L (ref 101–111)
CO2: 21 mmol/L — AB (ref 22–32)
CREATININE: 1.47 mg/dL — AB (ref 0.44–1.00)
Calcium: 8.2 mg/dL — ABNORMAL LOW (ref 8.9–10.3)
GFR calc non Af Amer: 35 mL/min — ABNORMAL LOW (ref >60)
GFR, EST AFRICAN AMERICAN: 41 mL/min — AB (ref >60)
Glucose, Bld: 129 mg/dL — ABNORMAL HIGH (ref 65–99)
POTASSIUM: 4.6 mmol/L (ref 3.5–5.1)
SODIUM: 130 mmol/L — AB (ref 135–145)

## 2016-08-13 LAB — GLUCOSE, CAPILLARY
GLUCOSE-CAPILLARY: 186 mg/dL — AB (ref 65–99)
GLUCOSE-CAPILLARY: 111 mg/dL — AB (ref 65–99)
Glucose-Capillary: 137 mg/dL — ABNORMAL HIGH (ref 65–99)
Glucose-Capillary: 162 mg/dL — ABNORMAL HIGH (ref 65–99)

## 2016-08-13 MED ORDER — PREDNISOLONE 5 MG PO TABS
40.0000 mg | ORAL_TABLET | Freq: Every day | ORAL | Status: DC
  Filled 2016-08-13 (×2): qty 8

## 2016-08-13 MED ORDER — ALBUTEROL SULFATE (2.5 MG/3ML) 0.083% IN NEBU
2.5000 mg | INHALATION_SOLUTION | RESPIRATORY_TRACT | Status: DC | PRN
  Administered 2016-08-13: 2.5 mg via RESPIRATORY_TRACT
  Filled 2016-08-13: qty 3

## 2016-08-13 MED ORDER — GUAIFENESIN ER 600 MG PO TB12
1200.0000 mg | ORAL_TABLET | Freq: Two times a day (BID) | ORAL | Status: DC
  Administered 2016-08-13 – 2016-08-14 (×3): 1200 mg via ORAL
  Filled 2016-08-13 (×3): qty 2

## 2016-08-13 NOTE — Progress Notes (Signed)
**Note De-Identified Arely Tinner Obfuscation** RRT responded to RN call for PRN HHN due to SOB following ambulation.  Patient used deep breathing exercise to return to baseline breathing.  RRT ordered Albuterol 2.5mg  HHN Q4 PRN; will administer when needed.  RRT to continue to monitor.

## 2016-08-13 NOTE — Progress Notes (Signed)
PROGRESS NOTE  Lindsay Salazar CNO:[id number] DOB: November 06, 1950 DOA: 08/09/2016 PCP: [name], PA-C  Brief History: 66 y.o.femalewith medical history of chronic respiratory failure on 2 L, COPD, chronic atrial fibrillation, and pancreatic cancer presented with one-day history of worsening shortness of breath and coughing with white sputum. The patient was using her nebulizers without much success. In addition, the patient has had intermittent chest discomfort for the past 2-3 days. She describes it as a"fluttering that comes and goes"on her left side of the chest. She denies any recent injury or falls. She states that the chest discomfort lasted a few minutes and subsided spontaneously. There is no exacerbating or alleviating factors. She complains of loose stools in the past 1-2 days. She states that she has had 5 loose bowel movements in the past 24 hours prior to admission without any hematochezia or melena. She denies any new antibiotics. She denies any recent travels raw or undercooked foods. She denies any sick contacts.  Assessment/Plan: COPD Exacerbation- with chronic respiratory failure improved. O2 sats stable on home 2 L of O2.  -Switch IV Solu Medrol to prednisone -DuoNeb every 6 hours -ContinuePulmicort--increased to 0.5 mg bid -08/12/16--add Brovana -Continue Levaquin complete five-day course  Atrial fibrillation with RVR -Precipitated by COPD exacerbation -Transitiondiltiazem drip to po diltiazem-->transition to long acting agent -now rate controlled -TSH--4.405 -05/30/2016 echo in the 60-65%, no WMA, moderate TR -Continue Xarelto -CHADSVASc = 3  Acute on chronic renal failure CKD stage 3- improved. Likely due to diuretics. Peaked at 3.2 >> 1.47 today -d/c lasix and KCl supplement -renal ultrasound--neg hydronephrosis -baseline creatinine 0.8-1.2 -am BMP  Pyuria -UA--TNTC WBC -Urine cultures growing more than 50,000 colonies of gram-negative rods -  Awaiting speciation and sensitivities  Atypical chest pain -no angina -cycle troponins--neg x 3 -personally reviewed EKG--no ST-T changes  Diarrhea- resolved -Stool studies cancelled  Hyperglycemia/Impaired glucose tolerance -elevation due to steroids -novolog sliding scale  Hypertension -Controlled -Continue diltiazem and metoprolol tartrate  Pancreatic adenocarcinoma -S/P EUS by Dr. Ardis Hughs on 11/29/2015 with FNA demonstrating adenocarcinoma -re-evaluated for distal pancreatectomy by Dr. Barry Dienes on 03/19/2016.  -CA 19-9 elevated initiation of treatment at 197 -Started on gemcitabine/Abraxane beginning on 29/47/6546  -Course complicated by multiple hospitalizations unrelated to her malignancy or treatment -last treatment was on 05/15/2016 with Gemcitabine/Abraxane -Medonc planning restaging MRI abd and if stable, refer back to Dr. Barry Dienes -has pulm appointment 7/13.  Deconditioning -PT eval-->SNF- patient declined she will be sent home with home health.  Hypomagnesemia -repleted -am Mag--2.3  Disposition- home likely 7/12. Pending urine culture sensitivities and speciation.  Subjective: Breathing has improved. No complaints today. Ambulatory O2 sats 95% on 2 L, 85% on room air.  Objective: Vitals:   08/13/16 0739 08/13/16 0745 08/13/16 1300 08/13/16 1321  BP:   (!) 113/57   Pulse:   85   Resp:   18   Temp:   (!) 97.5 F (36.4 C)   TempSrc:   Oral   SpO2: 98% 98% 96% (!) 89%  Weight:      Height:        Intake/Output Summary (Last 24 hours) at 08/13/16 1326 Last data filed at 08/13/16 1301  Gross per 24 hour  Intake              960 ml  Output             1050 ml  Net              -  90 ml   Weight change: -0.045 kg (-1.6 oz) Exam:   General:  Pt is alert, follows commands appropriately, not in acute distress  HEENT: No icterus, No thrush, No neck mass, [place]/AT  Cardiovascular: IRRR, S1/S2, no rubs, no gallops  Respiratory: Breath sounds  congested, no cracles appreciated. Good air movement.   Abdomen: Soft/+BS, non tender, non distended, no guarding  Extremities: No edema, No lymphangitis, No petechiae, No rashes, no synovitis  Data Reviewed: I have personally reviewed following labs and imaging studies Basic Metabolic Panel:  Recent Labs Lab 08/09/16 1320 08/10/16 0045 08/11/16 0557 08/12/16 0615 08/13/16 0748  NA 140 132* 132* 131* 130*  K 4.0 4.7 5.3* 4.9 4.6  CL 103 97* 101 103 102  CO2 26 21* 23 22 21*  GLUCOSE 116* 252* 151* 136* 129*  BUN 18 27* 48* 57* 53*  CREATININE 1.12* 1.92* 2.32* 2.03* 1.47*  CALCIUM 9.2 8.6* 8.0* 7.9* 8.2*  MG  --  1.3*  --  2.3  --    CBC:  Recent Labs Lab 08/09/16 1320  WBC 9.5  HGB 12.3  HCT 38.6  MCV 92.1  PLT 198   Cardiac Enzymes:  Recent Labs Lab 08/09/16 1320 08/09/16 2011 08/10/16 0045  TROPONINI <0.03 <0.03 <0.03   CBG:  Recent Labs Lab 08/12/16 1144 08/12/16 1656 08/12/16 2012 08/13/16 0743 08/13/16 1107  GLUCAP 148* 158* 161* 137* 186*   Urine analysis:    Component Value Date/Time   COLORURINE YELLOW 08/11/2016 1604   APPEARANCEUR HAZY (A) 08/11/2016 1604   LABSPEC 1.016 08/11/2016 1604   PHURINE 5.0 08/11/2016 1604   GLUCOSEU NEGATIVE 08/11/2016 1604   HGBUR NEGATIVE 08/11/2016 1604   BILIRUBINUR NEGATIVE 08/11/2016 1604   KETONESUR NEGATIVE 08/11/2016 1604   PROTEINUR NEGATIVE 08/11/2016 1604   NITRITE NEGATIVE 08/11/2016 1604   LEUKOCYTESUR LARGE (A) 08/11/2016 1604   Sepsis Labs: @LABRCNTIP (procalcitonin:4,lacticidven:4) ) Recent Results (from the past 240 hour(s))  MRSA PCR Screening     Status: None   Collection Time: 08/09/16  3:49 PM  Result Value Ref Range Status   MRSA by PCR NEGATIVE NEGATIVE Final    Comment:        The GeneXpert MRSA Assay (FDA approved for NASAL specimens only), is one component of a comprehensive MRSA colonization surveillance program. It is not intended to diagnose MRSA infection nor to  guide or monitor treatment for MRSA infections.   Gastrointestinal Panel by PCR , Stool     Status: None   Collection Time: 08/09/16  5:40 PM  Result Value Ref Range Status   Campylobacter species NOT DETECTED NOT DETECTED Final   Plesimonas shigelloides NOT DETECTED NOT DETECTED Final   Salmonella species NOT DETECTED NOT DETECTED Final   Yersinia enterocolitica NOT DETECTED NOT DETECTED Final   Vibrio species NOT DETECTED NOT DETECTED Final   Vibrio cholerae NOT DETECTED NOT DETECTED Final   Enteroaggregative E coli (EAEC) NOT DETECTED NOT DETECTED Final   Enteropathogenic E coli (EPEC) NOT DETECTED NOT DETECTED Final   Enterotoxigenic E coli (ETEC) NOT DETECTED NOT DETECTED Final   Shiga like toxin producing E coli (STEC) NOT DETECTED NOT DETECTED Final   Shigella/Enteroinvasive E coli (EIEC) NOT DETECTED NOT DETECTED Final   Cryptosporidium NOT DETECTED NOT DETECTED Final   Cyclospora cayetanensis NOT DETECTED NOT DETECTED Final   Entamoeba histolytica NOT DETECTED NOT DETECTED Final   Giardia lamblia NOT DETECTED NOT DETECTED Final   Adenovirus F40/41 NOT DETECTED NOT DETECTED Final  Astrovirus NOT DETECTED NOT DETECTED Final   Norovirus GI/GII NOT DETECTED NOT DETECTED Final   Rotavirus A NOT DETECTED NOT DETECTED Final   Sapovirus (I, II, IV, and V) NOT DETECTED NOT DETECTED Final  Culture, Urine     Status: Abnormal (Preliminary result)   Collection Time: 08/11/16  4:04 PM  Result Value Ref Range Status   Specimen Description URINE, RANDOM  Final   Special Requests NONE  Final   Culture 50,000 COLONIES/mL GRAM NEGATIVE RODS (A)  Final   Report Status PENDING  Incomplete     Scheduled Meds: . arformoterol  15 mcg Nebulization BID  . budesonide (PULMICORT) nebulizer solution  0.5 mg Nebulization BID  . diltiazem  240 mg Oral Daily  . docusate sodium  100 mg Oral BID  . escitalopram  10 mg Oral Daily  . guaiFENesin  1,200 mg Oral BID  . insulin aspart  0-9 Units  Subcutaneous TID WC  . ipratropium-albuterol  3 mL Nebulization Q6H  . levofloxacin  500 mg Oral Q48H  . metoprolol tartrate  50 mg Oral BID  . pantoprazole  40 mg Oral Daily  . [START ON 08/14/2016] prednisoLONE  40 mg Oral Daily  . Rivaroxaban  15 mg Oral Q supper  . temazepam  15 mg Oral QHS  . [START ON 08/14/2016] Vitamin D (Ergocalciferol)  50,000 Units Oral Q7 days   Continuous Infusions:  Procedures/Studies: Dg Chest 2 View  Result Date: 08/09/2016 CLINICAL DATA:  Chest pain EXAM: CHEST  2 VIEW COMPARISON:  07/21/2016 FINDINGS: Lungs are markedly hyperinflated. Hazy opacity identified over the lateral left upper lobe potentially related to superimposition of soft tissue, but pneumonia not excluded. Lateral film suggests airspace disease in the right middle lobe. The cardiopericardial silhouette is within normal limits for size. Right Port-A-Cath remains in place. The visualized bony structures of the thorax are intact. Telemetry leads overlie the chest. IMPRESSION: Hyperexpansion with question of left upper lobe airspace opacity raising the question of pneumonia. Lateral film noted with opacity over the heart as can be seen in cases of right middle lobe airspace disease. Electronically Signed   By: Misty Stanley M.D.   On: 08/09/2016 14:05   US Renal  Result Date: 08/11/2016 CLINICAL DATA:  Acute kidney injury.  Pancreatic cancer. EXAM: RENAL / URINARY TRACT ULTRASOUND COMPLETE COMPARISON:  CT 07/31/2016 FINDINGS: Right Kidney: Length: 10.2 cm. Cortical thinning. 5 mm shadowing stone in the midpole. 1.6 cm lower pole cyst. This appears benign. No hydronephrosis. Normal echotexture. Left Kidney: Length: 9.3 cm. Mild cortical thinning. Normal echotexture. No hydronephrosis or mass. Bladder: Appears normal for degree of bladder distention. IMPRESSION: No hydronephrosis.  Mild cortical thinning bilaterally. Small nonobstructing right midpole stone. Electronically Signed   By: Rolm Baptise M.D.    On: 08/11/2016 10:26   Ct Abdomen W Wo Contrast  Result Date: 07/31/2016 CLINICAL DATA:  66 year old female with history of pancreatic cancer. Restaging examination. EXAM: CT ABDOMEN WITHOUT AND WITH CONTRAST TECHNIQUE: Multidetector CT imaging of the abdomen was performed following the standard protocol before and following the bolus administration of intravenous contrast. CONTRAST:  1mL ISOVUE-300 IOPAMIDOL (ISOVUE-300) INJECTION 61% COMPARISON:  MRI of the abdomen 03/12/2016. CT of the abdomen and pelvis 11/27/2015. FINDINGS: Lower chest: Unremarkable. Hepatobiliary: Multiple tiny subcentimeter low-attenuation lesions are again noted scattered throughout the hepatic parenchyma, similar in size, number and distribution to prior examinations, most compatible with tiny cysts and/or biliary hamartomas. In addition, there is a 6.4 x 5.9 cm  low-attenuation nonenhancing lesion centered predominantly in segment 3 of the liver, compatible with a large simple cyst. No new aggressive appearing hepatic lesions are noted. No intra or extrahepatic biliary ductal dilatation. Gallbladder is normal in appearance. Pancreas: Previously noted mass in the body of the pancreas is difficult to discretely visualize on today's examination. However, when assessed on arterial phase images using narrow window settings, the mass is estimated to measure approximately 1.9 x 3.0 cm (axial image 38 of series 3). There continues to be atrophy throughout the tail of the pancreas where there is also extensive ductal dilatation, similar to prior examinations. This mass comes in contact with the adjacent splenic vein, however, the vein is widely patent at this time. This mass comes in close proximity to the superior mesenteric vein and the splenoportal confluence, however, there appears to be an intervening fat plane between both of these vessels. The mass is well separated from the superior mesenteric artery. No peripancreatic inflammatory  changes or fluid collections. Spleen: Unremarkable. Adrenals/Urinary Tract: 5 mm nonobstructive calculus in the interpolar collecting system of the right kidney. 1.7 cm simple cyst in the lower pole of the right kidney. Left kidney is normal in appearance. No hydroureteronephrosis in the visualized abdomen. Bilateral adrenal glands are normal in appearance. Stomach/Bowel: Normal appearance of the stomach, although the previously described mass in the body of the pancreas slightly obscures the fat plane between the pancreas and the adjacent body of the stomach (axial image 28 of series 4 and sagittal image 63 of series 15). No pathologic dilatation of visualized portions of the small bowel or colon. Vascular/Lymphatic: Aortic atherosclerosis, without evidence of aneurysm or dissection in the abdominal vasculature. Vascular findings relevant to pancreatic carcinoma, as above. No lymphadenopathy noted in the abdomen. Other: Tiny umbilical hernia containing omental fat. No significant volume of ascites and no pneumoperitoneum noted in the visualized portions of the peritoneal cavity. Musculoskeletal: There are no aggressive appearing lytic or blastic lesions noted in the visualized portions of the skeleton. IMPRESSION: 1. Lesion in the body of the pancreas is not well demonstrated on today's CT examination, but appears similar in size to the most recent prior study, currently measuring approximately 3.0 x 1.9 cm. No definite evidence of metastatic disease in the abdomen. 2. Aortic atherosclerosis. 3. Nonobstructive 5 mm calculus in the interpolar collecting system of the right kidney. 4. Additional incidental findings, as above. Aortic Atherosclerosis (ICD10-I70.0). Electronically Signed   By: Vinnie Langton M.D.   On: 07/31/2016 15:40   Dg Chest Port 1 View  Result Date: 07/21/2016 CLINICAL DATA:  Cough, shortness of breath, pancreatic cancer EXAM: PORTABLE CHEST 1 VIEW COMPARISON:  06/08/2016 FINDINGS: Lungs are  clear. Right apical pleural-parenchymal scarring. No pleural effusion or pneumothorax. The heart is normal in size. Right chest port terminates at the cavoatrial junction. IMPRESSION: No evidence of acute cardiopulmonary disease. Electronically Signed   By: Julian Hy M.D.   On: 07/21/2016 15:34    Zaviyar Rahal Arlyce Dice, MD [facility] Pager [phone]  If 7PM-7AM, please contact night-coverage [email or web address] Password [email or web address] 08/13/2016, 1:26 PM   LOS: 4 days

## 2016-08-13 NOTE — Progress Notes (Signed)
Walked patient and checked O2 sats. Saturation was 85% walking on room air. Saturation was 95% on 2 Liters nasal cannula.

## 2016-08-14 LAB — URINE CULTURE: Culture: 50000 — AB

## 2016-08-14 LAB — GLUCOSE, CAPILLARY
Glucose-Capillary: 143 mg/dL — ABNORMAL HIGH (ref 65–99)
Glucose-Capillary: 183 mg/dL — ABNORMAL HIGH (ref 65–99)

## 2016-08-14 MED ORDER — POTASSIUM CHLORIDE CRYS ER 20 MEQ PO TBCR: 20.0000 meq | EXTENDED_RELEASE_TABLET | Freq: Every day | ORAL | 0 refills | Status: AC

## 2016-08-14 MED ORDER — LEVOFLOXACIN 500 MG PO TABS: 500.0000 mg | ORAL_TABLET | ORAL | 0 refills | Status: DC

## 2016-08-14 MED ORDER — GUAIFENESIN ER 600 MG PO TB12
1200.0000 mg | ORAL_TABLET | Freq: Two times a day (BID) | ORAL | 0 refills | Status: AC
Start: 2016-08-14 — End: ?

## 2016-08-14 MED ORDER — PREDNISOLONE SODIUM PHOSPHATE 15 MG/5ML PO SOLN
40.0000 mg | Freq: Every day | ORAL | Status: DC
  Administered 2016-08-14: 40 mg via ORAL
  Filled 2016-08-14 (×2): qty 15

## 2016-08-14 MED ORDER — FUROSEMIDE 40 MG PO TABS: 40.0000 mg | ORAL_TABLET | Freq: Every day | ORAL | 0 refills | Status: AC

## 2016-08-14 MED ORDER — COMBIVENT RESPIMAT 20-100 MCG/ACT IN AERS: 1.0000 | INHALATION_SPRAY | Freq: Four times a day (QID) | RESPIRATORY_TRACT | 5 refills | Status: AC | PRN

## 2016-08-14 MED ORDER — LORAZEPAM 0.5 MG PO TABS: 0.5000 mg | ORAL_TABLET | Freq: Four times a day (QID) | ORAL | 0 refills | Status: DC | PRN

## 2016-08-14 NOTE — Progress Notes (Signed)
Lindsay Salazar discharged Home per MD order.  Discharge instructions reviewed and discussed with the patient, all questions and concerns answered. Copy of instructions and scripts given to patient.   IV site discontinued and catheter remains intact. Site without signs and symptoms of complications. Dressing and pressure applied.  Patient escorted to car by NT in a wheelchair,  no distress noted upon discharge.  Ralene Muskrat Renay Crammer 08/14/2016 5:22 PM

## 2016-08-14 NOTE — Discharge Summary (Signed)
Physician Discharge Summary  Lindsay Salazar [facility] EUM:[id number] DOB: September 07, 1950 DOA: 08/09/2016  PCP: [name], PA-C  Admit date: 08/09/2016 Discharge date: 08/14/2016  Admitted From: Home  Disposition:  Home  Recommendations for Outpatient Follow-up:  1. Follow up with PCP in 1-2 weeks  Home Health: Yes   Equipment/Devices: Oxygen   Discharge Condition: Stable  CODE STATUS:  Full  Diet recommendation: Heart Healthy   HPI on admission- By Dr. Carles Collet. Lindsay Salazar is a 66 y.o. female with medical history of chronic respiratory failure on 2 L, COPD, chronic atrial fibrillation, and pancreatic cancer presented with one-day history of worsening shortness of breath and coughing with white sputum. The patient was using her nebulizers without much success. In addition, the patient has had intermittent chest discomfort for the past 2-3 days. She describes it as a"fluttering that comes and goes"on her left side of the chest. She denies any recent injury or falls. She states that the chest discomfort lasted a few minutes and subsided spontaneously. There is no exacerbating or alleviating factors. She complains of a cough chronically without hemoptysis. She has had some intermittent subjective chills without any headache, nausea, vomiting, abdominal pain. She complains of loose stools in the past 1-2 days. She states that she has had 5 loose bowel movements in the past 24 hours without any hematochezia or melena. She denies any new antibiotics. She denies any recent travels raw or undercooked foods. She denies any sick contacts.  In the emergency department, the patient was noted to have atrial fibrillation with RVR with heart rate 130-140. She was otherwise afebrile and hemodynamically stable. The patient was started on a diltiazem drip with improvement of her heart rate into the 100s.  Recent ER visits/hospitalizations: 7/7- 7/12- COPD exacerbation, pneumonia 6/18-6/19--COPD exacerbation 5/6-5/8--COPD  exacerbation 5/1-4 - Acute on chronic respiratory failure with hypoxia from COPD exacerbation and HCAP 4/23-30 - left foot cellulitis and acute COPD exacerbation 4/5-9 - Acute on chronic respiratory failure due to COPD exacerbation 3/30 - RLE cellulitis recheck 3/24-29 - RLE cellulitis  Discharge Diagnoses:  Pneumonia with COPD exacerbation- with chronic respiratory failure. Multiple recent ED visits and hospital admissions. Chest x-ray suggesting- left upper lobe opacity, middle lobe airspace disease. No leukocytosis. No fevers. Stable vitals. Patient was started on IV Levaquin started on IV Levaquin every 48 hours adjusted for renal insufficiency.  with improvement in symptoms Patient was given steroids , DuoNebs. Patient was discharged home  to take one more tablet of Levaquin complete a total of 6 days of antibiotics. On the day of discharge patient was feeling much better, was saturating 93-97% on her home 2 L of oxygen. Physical therapy recommended SNF, but patient abjectly refused, she was hence sent home with [facility]. She is encouraged to follow-up with her primary care provider in one week.  Atrial fibrillation with RVR-Precipitated by COPD exacerbation. Rate controlled on  IV diltiazem started on admission transitioned to by mouth diltiazem. TSH--4.405. 05/30/2016 echo in the 60-65%, no WMA, moderate TR.CHADSVASc = 3. Home Xarelto was continued.  Acute on chronic renal failure CKD stage 3-Likely due to diuretics. Peaked at 3.2, improved to 1.47 by discharge. Renal US- 08/11/16- No hydronephrosis.  Mild cortical thinning bilaterally. Small nonobstructing right midpole stonePatient was told to resume home diuretics and potassium supplements 08/16/2016.  Asymptomatic bacteriuria- patient asymptomatic, stable vitals. Without leukocytosis. Urine cultures grew 50,000 colonies of Escherichia coli. Patient likely colonized. With recent multiple hospital admissions, exposure to multiple antibiotics, no  suggestion  of systemic involvement, low colony count, no urinary tract symptoms, decision was made not to treat.   Atypical chest pain- EKG and troponins unremarkable. Should follow up with PCP for further workup, if needed. Echo 05/2016 EF- 60-65%.  Diarrhea- resolved-Stool studies cancelled  Hypertension -Continue diltiazem and metoprolol tartrate  Pancreatic adenocarcinoma -S/P EUS by Dr. Ardis Hughs on 11/29/2015 with FNA demonstrating adenocarcinoma -re-evaluated for distal pancreatectomy by Dr. Barry Dienes on 03/19/2016.  -CA 19-9 elevated initiation of treatment at 197 -Started on gemcitabine/Abraxane beginning on 38/18/2993  -Course complicated by multiple hospitalizations unrelated to her malignancy or treatment -last treatment was on 05/15/2016 with Gemcitabine/Abraxane -Medonc planning restaging MRI abd and if stable, refer back to Dr. Barry Dienes -has pulm appointment 7/13.  Deconditioning -PT eval-->SNF- patient declined she will be sent home with home health.  Hypomagnesemia  Mag--2.3 on discharge  Anxiety- significant anxiety on exam. Patient on chronic benzos temazepam at night and Ativan 0.5 mg every 6 hourly for anxiety. She reports that she has run out of her Ativan. 15 tablets of 0.5 mg Ativan prescription given to patient.   Discharge Instructions  Discharge Instructions    Diet - low sodium heart healthy    Complete by:  As directed    Discharge instructions    Complete by:  As directed    I have made some adjustments to your medications. Do not start taking your lasix till Saturday. Also do not take your potassium tablets till Saturday and then take it only once a day till you follow up with your PCP in 1 week.   Take one dose of the prescribed antibiotic on Saturday only- 08/16/16.  Please follow up with your PCP in 1 week.   Increase activity slowly    Complete by:  As directed      Allergies as of 08/14/2016      Reactions   Ibuprofen Nausea And Vomiting    Other    Seafood   Penicillins Swelling   Has patient had a PCN reaction causing immediate rash, facial/tongue/throat swelling, SOB or lightheadedness with hypotension: Yes Has patient had a PCN reaction causing severe rash involving mucus membranes or skin necrosis: No Has patient had a PCN reaction that required hospitalization No Has patient had a PCN reaction occurring within the last 10 years: No If all of the above answers are "NO", then may proceed with Cephalosporin use.   Tiotropium Bromide Monohydrate Itching   Tramadol Nausea And Vomiting      Medication List    STOP taking these medications   VENTOLIN HFA 108 (90 Base) MCG/ACT inhaler Generic drug:  albuterol     TAKE these medications   diltiazem 240 MG 24 hr capsule Commonly known as:  CARDIZEM CD Take 1 capsule (240 mg total) by mouth daily.   docusate sodium 100 MG capsule Commonly known as:  COLACE Take 1 capsule (100 mg total) by mouth 2 (two) times daily.   ergocalciferol 50000 units capsule Commonly known as:  VITAMIN D2 Take 1 capsule (50,000 Units total) by mouth once a week.   escitalopram 10 MG tablet Commonly known as:  LEXAPRO Take 1 tablet by mouth daily.   furosemide 40 MG tablet Commonly known as:  LASIX Take 1 tablet (40 mg total) by mouth daily. Start taking on:  08/16/2016 What changed:  These instructions start on 08/16/2016. If you are unsure what to do until then, ask your doctor or other care provider.   guaiFENesin 600 MG 12 hr  tablet Commonly known as:  MUCINEX Take 2 tablets (1,200 mg total) by mouth 2 (two) times daily.   HYDROcodone-acetaminophen 5-325 MG tablet Commonly known as:  NORCO/VICODIN Take 1 tablet by mouth every 4 (four) hours as needed.   ipratropium-albuterol 0.5-2.5 (3) MG/3ML Soln Commonly known as:  DUONEB Inhale 3 mLs into the lungs every 6 (six) hours as needed (for shortness of breath). What changed:  Another medication with the same name was changed. Make  sure you understand how and when to take each.   COMBIVENT RESPIMAT 20-100 MCG/ACT Aers respimat Generic drug:  Ipratropium-Albuterol Inhale 1 puff into the lungs every 6 (six) hours as needed for wheezing. What changed:  when to take this  reasons to take this   levofloxacin 500 MG tablet Commonly known as:  LEVAQUIN Take 1 tablet (500 mg total) by mouth every other day.   LORazepam 0.5 MG tablet Commonly known as:  ATIVAN Take 0.5 mg by mouth every 6 (six) hours as needed for anxiety. What changed:  Another medication with the same name was added. Make sure you understand how and when to take each.   LORazepam 0.5 MG tablet Commonly known as:  ATIVAN Take 1 tablet (0.5 mg total) by mouth every 6 (six) hours as needed for anxiety. What changed:  You were already taking a medication with the same name, and this prescription was added. Make sure you understand how and when to take each.   metoprolol tartrate 50 MG tablet Commonly known as:  LOPRESSOR Take 1 tablet (50 mg total) by mouth 2 (two) times daily.   OXYGEN Inhale 2 L into the lungs daily as needed (for breathing).   pantoprazole 40 MG tablet Commonly known as:  PROTONIX Take 1 tablet (40 mg total) by mouth daily.   potassium chloride SA 20 MEQ tablet Commonly known as:  K-DUR,KLOR-CON Take 1 tablet (20 mEq total) by mouth daily. Start taking on:  08/16/2016 What changed:  when to take this  These instructions start on 08/16/2016. If you are unsure what to do until then, ask your doctor or other care provider.   Rivaroxaban 15 MG Tabs tablet Commonly known as:  XARELTO Take 1 tablet (15 mg total) by mouth daily with supper.   SYMBICORT 160-4.5 MCG/ACT inhaler Generic drug:  budesonide-formoterol Inhale 1 puff into the lungs 2 (two) times daily.   temazepam 15 MG capsule Commonly known as:  RESTORIL Take 15 mg by mouth at bedtime.      Follow-up Information    [facility], [facility] Follow up.    Specialty:  Home Health Services Contact information: [place] [address] [phone]          Allergies  Allergen Reactions  . Ibuprofen Nausea And Vomiting  . Other     Seafood   . Penicillins Swelling    Has patient had a PCN reaction causing immediate rash, facial/tongue/throat swelling, SOB or lightheadedness with hypotension: Yes Has patient had a PCN reaction causing severe rash involving mucus membranes or skin necrosis: No Has patient had a PCN reaction that required hospitalization No Has patient had a PCN reaction occurring within the last 10 years: No If all of the above answers are "NO", then may proceed with Cephalosporin use.   . Tiotropium Bromide Monohydrate Itching  . Tramadol Nausea And Vomiting    Consultations:  None.  Procedures/Studies: Dg Chest 2 View  Result Date: 08/09/2016 CLINICAL DATA:  Chest pain EXAM:  CHEST  2 VIEW COMPARISON:  07/21/2016 FINDINGS: Lungs are markedly hyperinflated. Hazy opacity identified over the lateral left upper lobe potentially related to superimposition of soft tissue, but pneumonia not excluded. Lateral film suggests airspace disease in the right middle lobe. The cardiopericardial silhouette is within normal limits for size. Right Port-A-Cath remains in place. The visualized bony structures of the thorax are intact. Telemetry leads overlie the chest. IMPRESSION: Hyperexpansion with question of left upper lobe airspace opacity raising the question of pneumonia. Lateral film noted with opacity over the heart as can be seen in cases of right middle lobe airspace disease. Electronically Signed   By: Misty Stanley M.D.   On: 08/09/2016 14:05   US Renal  Result Date: 08/11/2016 CLINICAL DATA:  Acute kidney injury.  Pancreatic cancer. EXAM: RENAL / URINARY TRACT ULTRASOUND COMPLETE COMPARISON:  CT 07/31/2016 FINDINGS: Right Kidney: Length: 10.2 cm. Cortical thinning. 5 mm shadowing stone in the midpole.  1.6 cm lower pole cyst. This appears benign. No hydronephrosis. Normal echotexture. Left Kidney: Length: 9.3 cm. Mild cortical thinning. Normal echotexture. No hydronephrosis or mass. Bladder: Appears normal for degree of bladder distention. IMPRESSION: No hydronephrosis.  Mild cortical thinning bilaterally. Small nonobstructing right midpole stone. Electronically Signed   By: Rolm Baptise M.D.   On: 08/11/2016 10:26    Subjective: Patient feeling much better today ready for discharge. Denies shortness of breath, has chronic cough. No chest pain  Discharge Exam: Vitals:   08/13/16 2213 08/14/16 0540  BP: 128/76 125/74  Pulse: 73 88  Resp: 20 14  Temp: 98.2 F (36.8 C) 98.5 F (36.9 C)   Vitals:   08/13/16 2213 08/14/16 0116 08/14/16 0540 08/14/16 0725  BP: 128/76  125/74   Pulse: 73  88   Resp: 20  14   Temp: 98.2 F (36.8 C)  98.5 F (36.9 C)   TempSrc: Oral  Oral   SpO2: 97% 93% 97% 92%  Weight:   53.4 kg (117 lb 11.6 oz)   Height:        General: Pt is alert, awake, not in acute distress, on 2L of O2 Cardiovascular: Irregular, S1/S2 +, no rubs, no gallops Respiratory: CTA bilaterally, no wheezing, no rhonchi Abdominal: Soft, NT, ND, bowel sounds + Extremities: no edema, no cyanosis  The results of significant diagnostics from this hospitalization (including imaging, microbiology, ancillary and laboratory) are listed below for reference.    Microbiology: Recent Results (from the past 240 hour(s))  MRSA PCR Screening     Status: None   Collection Time: 08/09/16  3:49 PM  Result Value Ref Range Status   MRSA by PCR NEGATIVE NEGATIVE Final    Comment:        The GeneXpert MRSA Assay (FDA approved for NASAL specimens only), is one component of a comprehensive MRSA colonization surveillance program. It is not intended to diagnose MRSA infection nor to guide or monitor treatment for MRSA infections.   Gastrointestinal Panel by PCR , Stool     Status: None    Collection Time: 08/09/16  5:40 PM  Result Value Ref Range Status   Campylobacter species NOT DETECTED NOT DETECTED Final   Plesimonas shigelloides NOT DETECTED NOT DETECTED Final   Salmonella species NOT DETECTED NOT DETECTED Final   Yersinia enterocolitica NOT DETECTED NOT DETECTED Final   Vibrio species NOT DETECTED NOT DETECTED Final   Vibrio cholerae NOT DETECTED NOT DETECTED Final   Enteroaggregative E coli (EAEC) NOT DETECTED NOT DETECTED Final  Enteropathogenic E coli (EPEC) NOT DETECTED NOT DETECTED Final   Enterotoxigenic E coli (ETEC) NOT DETECTED NOT DETECTED Final   Shiga like toxin producing E coli (STEC) NOT DETECTED NOT DETECTED Final   Shigella/Enteroinvasive E coli (EIEC) NOT DETECTED NOT DETECTED Final   Cryptosporidium NOT DETECTED NOT DETECTED Final   Cyclospora cayetanensis NOT DETECTED NOT DETECTED Final   Entamoeba histolytica NOT DETECTED NOT DETECTED Final   Giardia lamblia NOT DETECTED NOT DETECTED Final   Adenovirus F40/41 NOT DETECTED NOT DETECTED Final   Astrovirus NOT DETECTED NOT DETECTED Final   Norovirus GI/GII NOT DETECTED NOT DETECTED Final   Rotavirus A NOT DETECTED NOT DETECTED Final   Sapovirus (I, II, IV, and V) NOT DETECTED NOT DETECTED Final  Culture, Urine     Status: Abnormal   Collection Time: 08/11/16  4:04 PM  Result Value Ref Range Status   Specimen Description URINE, RANDOM  Final   Special Requests NONE  Final   Culture 50,000 COLONIES/mL ESCHERICHIA COLI (A)  Final   Report Status 08/14/2016 FINAL  Final   Organism ID, Bacteria ESCHERICHIA COLI (A)  Final      Susceptibility   Escherichia coli - MIC*    AMPICILLIN 8 SENSITIVE Sensitive     CEFAZOLIN <=4 SENSITIVE Sensitive     CEFTRIAXONE <=1 SENSITIVE Sensitive     CIPROFLOXACIN >=4 RESISTANT Resistant     GENTAMICIN >=16 RESISTANT Resistant     IMIPENEM <=0.25 SENSITIVE Sensitive     NITROFURANTOIN <=16 SENSITIVE Sensitive     TRIMETH/SULFA <=20 SENSITIVE Sensitive      AMPICILLIN/SULBACTAM 4 SENSITIVE Sensitive     PIP/TAZO <=4 SENSITIVE Sensitive     Extended ESBL NEGATIVE Sensitive     * 50,000 COLONIES/mL ESCHERICHIA COLI     Labs: BNP (last 3 results)  Recent Labs  05/08/16 1645 06/03/16 0909 06/08/16 1140  BNP 711.0* 907.0* 409.8*   Basic Metabolic Panel:  Recent Labs Lab 08/09/16 1320 08/10/16 0045 08/11/16 0557 08/12/16 0615 08/13/16 0748  NA 140 132* 132* 131* 130*  K 4.0 4.7 5.3* 4.9 4.6  CL 103 97* 101 103 102  CO2 26 21* 23 22 21*  GLUCOSE 116* 252* 151* 136* 129*  BUN 18 27* 48* 57* 53*  CREATININE 1.12* 1.92* 2.32* 2.03* 1.47*  CALCIUM 9.2 8.6* 8.0* 7.9* 8.2*  MG  --  1.3*  --  2.3  --    CBC:  Recent Labs Lab 08/09/16 1320  WBC 9.5  HGB 12.3  HCT 38.6  MCV 92.1  PLT 198   Cardiac Enzymes:  Recent Labs Lab 08/09/16 1320 08/09/16 2011 08/10/16 0045  TROPONINI <0.03 <0.03 <0.03   BNP: Invalid input(s): POCBNP CBG:  Recent Labs Lab 08/13/16 0743 08/13/16 1107 08/13/16 1614 08/13/16 2211 08/14/16 0802  GLUCAP 137* 186* 111* 162* 143*   Urinalysis    Component Value Date/Time   COLORURINE YELLOW 08/11/2016 1604   APPEARANCEUR HAZY (A) 08/11/2016 1604   LABSPEC 1.016 08/11/2016 1604   PHURINE 5.0 08/11/2016 1604   GLUCOSEU NEGATIVE 08/11/2016 1604   HGBUR NEGATIVE 08/11/2016 1604   BILIRUBINUR NEGATIVE 08/11/2016 1604   KETONESUR NEGATIVE 08/11/2016 1604   PROTEINUR NEGATIVE 08/11/2016 1604   NITRITE NEGATIVE 08/11/2016 1604   LEUKOCYTESUR LARGE (A) 08/11/2016 1604   Sepsis Labs Invalid input(s): PROCALCITONIN,  WBC,  LACTICIDVEN Microbiology Recent Results (from the past 240 hour(s))  MRSA PCR Screening     Status: None   Collection Time: 08/09/16  3:49 PM  Result Value Ref Range Status   MRSA by PCR NEGATIVE NEGATIVE Final    Comment:        The GeneXpert MRSA Assay (FDA approved for NASAL specimens only), is one component of a comprehensive MRSA colonization surveillance  program. It is not intended to diagnose MRSA infection nor to guide or monitor treatment for MRSA infections.   Gastrointestinal Panel by PCR , Stool     Status: None   Collection Time: 08/09/16  5:40 PM  Result Value Ref Range Status   Campylobacter species NOT DETECTED NOT DETECTED Final   Plesimonas shigelloides NOT DETECTED NOT DETECTED Final   Salmonella species NOT DETECTED NOT DETECTED Final   Yersinia enterocolitica NOT DETECTED NOT DETECTED Final   Vibrio species NOT DETECTED NOT DETECTED Final   Vibrio cholerae NOT DETECTED NOT DETECTED Final   Enteroaggregative E coli (EAEC) NOT DETECTED NOT DETECTED Final   Enteropathogenic E coli (EPEC) NOT DETECTED NOT DETECTED Final   Enterotoxigenic E coli (ETEC) NOT DETECTED NOT DETECTED Final   Shiga like toxin producing E coli (STEC) NOT DETECTED NOT DETECTED Final   Shigella/Enteroinvasive E coli (EIEC) NOT DETECTED NOT DETECTED Final   Cryptosporidium NOT DETECTED NOT DETECTED Final   Cyclospora cayetanensis NOT DETECTED NOT DETECTED Final   Entamoeba histolytica NOT DETECTED NOT DETECTED Final   Giardia lamblia NOT DETECTED NOT DETECTED Final   Adenovirus F40/41 NOT DETECTED NOT DETECTED Final   Astrovirus NOT DETECTED NOT DETECTED Final   Norovirus GI/GII NOT DETECTED NOT DETECTED Final   Rotavirus A NOT DETECTED NOT DETECTED Final   Sapovirus (I, II, IV, and V) NOT DETECTED NOT DETECTED Final  Culture, Urine     Status: Abnormal   Collection Time: 08/11/16  4:04 PM  Result Value Ref Range Status   Specimen Description URINE, RANDOM  Final   Special Requests NONE  Final   Culture 50,000 COLONIES/mL ESCHERICHIA COLI (A)  Final   Report Status 08/14/2016 FINAL  Final   Organism ID, Bacteria ESCHERICHIA COLI (A)  Final      Susceptibility   Escherichia coli - MIC*    AMPICILLIN 8 SENSITIVE Sensitive     CEFAZOLIN <=4 SENSITIVE Sensitive     CEFTRIAXONE <=1 SENSITIVE Sensitive     CIPROFLOXACIN >=4 RESISTANT Resistant      GENTAMICIN >=16 RESISTANT Resistant     IMIPENEM <=0.25 SENSITIVE Sensitive     NITROFURANTOIN <=16 SENSITIVE Sensitive     TRIMETH/SULFA <=20 SENSITIVE Sensitive     AMPICILLIN/SULBACTAM 4 SENSITIVE Sensitive     PIP/TAZO <=4 SENSITIVE Sensitive     Extended ESBL NEGATIVE Sensitive     * 50,000 COLONIES/mL ESCHERICHIA COLI   Time coordinating discharge: Over 30 minutes  SIGNED:  Bethena Roys, MD  [facility] 08/14/2016, 10:29 AM Pager [phone]  If 7PM-7AM, please contact night-coverage [email or web address] Password [email or web address]

## 2016-08-14 NOTE — Care Management Important Message (Signed)
Important Message  Patient Details  Name: Lindsay Salazar MRN: [id number] Date of Birth: 1950/10/27   Medicare Important Message Given:  Yes    Sherald Barge, RN 08/14/2016, 10:31 AM

## 2016-08-14 NOTE — Care Management Note (Signed)
Case Management Note  Patient Details  Name: Lindsay Salazar MRN: [id number] Date of Birth: 1951/02/02  Expected Discharge Date:  08/14/16               Expected Discharge Plan:  [place]  In-House Referral:  Clinical Social Work  Discharge planning Services  CM Consult  Post Acute Care Choice:  Home Health Choice offered to:  Patient  HH Arranged:  RN, Nurse's Aide, PT [place] Agency:  [facility]  Status of Service:  Completed, signed off  Additional Comments: Pt discharging home today with [facility] RNI services through [facility]. [facility] was unable to accept pt back uner their services. [facility] has agreed. Pt aware and agreeable. She is aware [facility] will be making daily visits directly after discharge.   Sherald Barge, RN 08/14/2016, 10:32 AM

## 2016-08-14 NOTE — Care Management Note (Signed)
Case Management Note  Patient Details  Name: Lindsay Salazar MRN: [id number] Date of Birth: September 12, 1950  If discussed at [address] Length of Stay Meetings, dates discussed:  08/14/2016  Sherald Barge, RN 08/14/2016, 10:34 AM

## 2016-08-15 ENCOUNTER — Emergency Department (HOSPITAL_COMMUNITY): Payer: Medicare HMO

## 2016-08-15 ENCOUNTER — Encounter (HOSPITAL_COMMUNITY): Payer: Self-pay | Admitting: Emergency Medicine

## 2016-08-15 ENCOUNTER — Inpatient Hospital Stay (HOSPITAL_COMMUNITY)
Admission: EM | Admit: 2016-08-15 | Discharge: 2016-08-22 | DRG: 190 | Disposition: A | Discharge status: Home Health | Payer: Medicare HMO | Attending: Internal Medicine | Admitting: Internal Medicine

## 2016-08-15 ENCOUNTER — Other Ambulatory Visit: Payer: Self-pay

## 2016-08-15 DIAGNOSIS — I482 Chronic atrial fibrillation: Secondary | ICD-10-CM | POA: Diagnosis present

## 2016-08-15 DIAGNOSIS — J441 Chronic obstructive pulmonary disease with (acute) exacerbation: Secondary | ICD-10-CM | POA: Diagnosis present

## 2016-08-15 DIAGNOSIS — Z7189 Other specified counseling: Secondary | ICD-10-CM | POA: Diagnosis not present

## 2016-08-15 DIAGNOSIS — N183 Chronic kidney disease, stage 3 unspecified: Secondary | ICD-10-CM | POA: Diagnosis present

## 2016-08-15 DIAGNOSIS — T380X5A Adverse effect of glucocorticoids and synthetic analogues, initial encounter: Secondary | ICD-10-CM | POA: Diagnosis present

## 2016-08-15 DIAGNOSIS — J181 Lobar pneumonia, unspecified organism: Secondary | ICD-10-CM | POA: Diagnosis present

## 2016-08-15 DIAGNOSIS — Z825 Family history of asthma and other chronic lower respiratory diseases: Secondary | ICD-10-CM

## 2016-08-15 DIAGNOSIS — J9621 Acute and chronic respiratory failure with hypoxia: Secondary | ICD-10-CM | POA: Diagnosis present

## 2016-08-15 DIAGNOSIS — I5032 Chronic diastolic (congestive) heart failure: Secondary | ICD-10-CM | POA: Diagnosis present

## 2016-08-15 DIAGNOSIS — Z90411 Acquired partial absence of pancreas: Secondary | ICD-10-CM

## 2016-08-15 DIAGNOSIS — I4891 Unspecified atrial fibrillation: Secondary | ICD-10-CM | POA: Diagnosis present

## 2016-08-15 DIAGNOSIS — Z79899 Other long term (current) drug therapy: Secondary | ICD-10-CM | POA: Diagnosis not present

## 2016-08-15 DIAGNOSIS — R0602 Shortness of breath: Secondary | ICD-10-CM

## 2016-08-15 DIAGNOSIS — F419 Anxiety disorder, unspecified: Secondary | ICD-10-CM | POA: Diagnosis present

## 2016-08-15 DIAGNOSIS — E1165 Type 2 diabetes mellitus with hyperglycemia: Secondary | ICD-10-CM | POA: Diagnosis present

## 2016-08-15 DIAGNOSIS — J440 Chronic obstructive pulmonary disease with acute lower respiratory infection: Secondary | ICD-10-CM | POA: Diagnosis present

## 2016-08-15 DIAGNOSIS — E1122 Type 2 diabetes mellitus with diabetic chronic kidney disease: Secondary | ICD-10-CM | POA: Diagnosis present

## 2016-08-15 DIAGNOSIS — Z833 Family history of diabetes mellitus: Secondary | ICD-10-CM

## 2016-08-15 DIAGNOSIS — Z7951 Long term (current) use of inhaled steroids: Secondary | ICD-10-CM

## 2016-08-15 DIAGNOSIS — F329 Major depressive disorder, single episode, unspecified: Secondary | ICD-10-CM | POA: Diagnosis present

## 2016-08-15 DIAGNOSIS — M797 Fibromyalgia: Secondary | ICD-10-CM | POA: Diagnosis present

## 2016-08-15 DIAGNOSIS — Z9221 Personal history of antineoplastic chemotherapy: Secondary | ICD-10-CM | POA: Diagnosis not present

## 2016-08-15 DIAGNOSIS — I130 Hypertensive heart and chronic kidney disease with heart failure and stage 1 through stage 4 chronic kidney disease, or unspecified chronic kidney disease: Secondary | ICD-10-CM | POA: Diagnosis present

## 2016-08-15 DIAGNOSIS — Z515 Encounter for palliative care: Secondary | ICD-10-CM | POA: Diagnosis not present

## 2016-08-15 DIAGNOSIS — Z9981 Dependence on supplemental oxygen: Secondary | ICD-10-CM | POA: Diagnosis not present

## 2016-08-15 DIAGNOSIS — Z659 Problem related to unspecified psychosocial circumstances: Secondary | ICD-10-CM | POA: Diagnosis not present

## 2016-08-15 DIAGNOSIS — B9620 Unspecified Escherichia coli [E. coli] as the cause of diseases classified elsewhere: Secondary | ICD-10-CM | POA: Diagnosis present

## 2016-08-15 DIAGNOSIS — Z7901 Long term (current) use of anticoagulants: Secondary | ICD-10-CM

## 2016-08-15 DIAGNOSIS — C259 Malignant neoplasm of pancreas, unspecified: Secondary | ICD-10-CM | POA: Diagnosis not present

## 2016-08-15 DIAGNOSIS — D638 Anemia in other chronic diseases classified elsewhere: Secondary | ICD-10-CM | POA: Diagnosis present

## 2016-08-15 DIAGNOSIS — K219 Gastro-esophageal reflux disease without esophagitis: Secondary | ICD-10-CM | POA: Diagnosis present

## 2016-08-15 DIAGNOSIS — R0902 Hypoxemia: Secondary | ICD-10-CM | POA: Diagnosis not present

## 2016-08-15 DIAGNOSIS — Y95 Nosocomial condition: Secondary | ICD-10-CM | POA: Diagnosis present

## 2016-08-15 DIAGNOSIS — Z823 Family history of stroke: Secondary | ICD-10-CM | POA: Diagnosis not present

## 2016-08-15 DIAGNOSIS — Z87891 Personal history of nicotine dependence: Secondary | ICD-10-CM

## 2016-08-15 DIAGNOSIS — I10 Essential (primary) hypertension: Secondary | ICD-10-CM | POA: Diagnosis present

## 2016-08-15 DIAGNOSIS — J189 Pneumonia, unspecified organism: Secondary | ICD-10-CM

## 2016-08-15 LAB — CBC WITH DIFFERENTIAL/PLATELET
BASOS PCT: 0 %
Basophils Absolute: 0 10*3/uL (ref 0.0–0.1)
EOS PCT: 0 %
Eosinophils Absolute: 0 10*3/uL (ref 0.0–0.7)
HCT: 29.5 % — ABNORMAL LOW (ref 36.0–46.0)
HEMOGLOBIN: 9.3 g/dL — AB (ref 12.0–15.0)
Lymphocytes Relative: 9 %
Lymphs Abs: 0.9 10*3/uL (ref 0.7–4.0)
MCH: 29.5 pg (ref 26.0–34.0)
MCHC: 31.5 g/dL (ref 30.0–36.0)
MCV: 93.7 fL (ref 78.0–100.0)
MONO ABS: 0.6 10*3/uL (ref 0.1–1.0)
Monocytes Relative: 6 %
NEUTROS ABS: 8.9 10*3/uL — AB (ref 1.7–7.7)
NEUTROS PCT: 85 %
Platelets: 190 10*3/uL (ref 150–400)
RBC: 3.15 MIL/uL — ABNORMAL LOW (ref 3.87–5.11)
RDW: 14.7 % (ref 11.5–15.5)
WBC: 10.4 10*3/uL (ref 4.0–10.5)

## 2016-08-15 LAB — BASIC METABOLIC PANEL
Anion gap: 8 (ref 5–15)
BUN: 36 mg/dL — AB (ref 6–20)
CALCIUM: 8.7 mg/dL — AB (ref 8.9–10.3)
CO2: 25 mmol/L (ref 22–32)
CREATININE: 1.09 mg/dL — AB (ref 0.44–1.00)
Chloride: 103 mmol/L (ref 101–111)
GFR calc Af Amer: 59 mL/min — ABNORMAL LOW (ref >60)
GFR, EST NON AFRICAN AMERICAN: 51 mL/min — AB (ref >60)
GLUCOSE: 112 mg/dL — AB (ref 65–99)
POTASSIUM: 3.5 mmol/L (ref 3.5–5.1)
SODIUM: 136 mmol/L (ref 135–145)

## 2016-08-15 LAB — BRAIN NATRIURETIC PEPTIDE: B NATRIURETIC PEPTIDE 5: 625 pg/mL — AB (ref 0.0–100.0)

## 2016-08-15 LAB — TROPONIN I: Troponin I: <0.03 ng/mL (ref <0.03)

## 2016-08-15 MED ORDER — SODIUM CHLORIDE 0.9% FLUSH
3.0000 mL | Freq: Two times a day (BID) | INTRAVENOUS | Status: DC
  Administered 2016-08-15 – 2016-08-22 (×12): 3 mL via INTRAVENOUS

## 2016-08-15 MED ORDER — MOMETASONE FURO-FORMOTEROL FUM 200-5 MCG/ACT IN AERO
2.0000 | INHALATION_SPRAY | Freq: Two times a day (BID) | RESPIRATORY_TRACT | Status: DC
  Administered 2016-08-15 – 2016-08-22 (×14): 2 via RESPIRATORY_TRACT
  Filled 2016-08-15 (×2): qty 8.8

## 2016-08-15 MED ORDER — SODIUM CHLORIDE 0.9% FLUSH: 3.0000 mL | INTRAVENOUS | Status: DC | PRN

## 2016-08-15 MED ORDER — FUROSEMIDE 40 MG PO TABS
40.0000 mg | ORAL_TABLET | Freq: Every day | ORAL | Status: DC
Start: 2016-08-16 — End: 2016-08-22
  Administered 2016-08-16 – 2016-08-22 (×7): 40 mg via ORAL
  Filled 2016-08-15 (×7): qty 1

## 2016-08-15 MED ORDER — RIVAROXABAN 15 MG PO TABS
15.0000 mg | ORAL_TABLET | Freq: Every day | ORAL | Status: DC
  Administered 2016-08-15: 15 mg via ORAL
  Filled 2016-08-15: qty 1

## 2016-08-15 MED ORDER — METOPROLOL TARTRATE 5 MG/5ML IV SOLN
5.0000 mg | INTRAVENOUS | Status: AC | PRN
  Administered 2016-08-16 – 2016-08-17 (×2): 5 mg via INTRAVENOUS
  Filled 2016-08-15 (×2): qty 5

## 2016-08-15 MED ORDER — LEVOFLOXACIN 500 MG PO TABS
500.0000 mg | ORAL_TABLET | Freq: Once | ORAL | Status: DC
  Filled 2016-08-15: qty 1

## 2016-08-15 MED ORDER — DOCUSATE SODIUM 100 MG PO CAPS
100.0000 mg | ORAL_CAPSULE | Freq: Two times a day (BID) | ORAL | Status: DC
  Administered 2016-08-15 – 2016-08-22 (×13): 100 mg via ORAL
  Filled 2016-08-15 (×14): qty 1

## 2016-08-15 MED ORDER — ESCITALOPRAM OXALATE 10 MG PO TABS
10.0000 mg | ORAL_TABLET | Freq: Every day | ORAL | Status: DC
  Administered 2016-08-16 – 2016-08-22 (×7): 10 mg via ORAL
  Filled 2016-08-15 (×7): qty 1

## 2016-08-15 MED ORDER — METHYLPREDNISOLONE SODIUM SUCC 125 MG IJ SOLR
125.0000 mg | Freq: Once | INTRAMUSCULAR | Status: AC
  Administered 2016-08-15: 125 mg via INTRAVENOUS
  Filled 2016-08-15: qty 2

## 2016-08-15 MED ORDER — POTASSIUM CHLORIDE CRYS ER 20 MEQ PO TBCR
20.0000 meq | EXTENDED_RELEASE_TABLET | Freq: Every day | ORAL | Status: DC
  Administered 2016-08-16 – 2016-08-22 (×7): 20 meq via ORAL
  Filled 2016-08-15 (×7): qty 1

## 2016-08-15 MED ORDER — IPRATROPIUM-ALBUTEROL 0.5-2.5 (3) MG/3ML IN SOLN
3.0000 mL | RESPIRATORY_TRACT | Status: DC | PRN
  Administered 2016-08-16 – 2016-08-18 (×9): 3 mL via RESPIRATORY_TRACT
  Filled 2016-08-15 (×9): qty 3

## 2016-08-15 MED ORDER — LORAZEPAM 0.5 MG PO TABS
0.5000 mg | ORAL_TABLET | Freq: Four times a day (QID) | ORAL | Status: DC | PRN
  Administered 2016-08-15 – 2016-08-22 (×9): 0.5 mg via ORAL
  Filled 2016-08-15 (×9): qty 1

## 2016-08-15 MED ORDER — IPRATROPIUM-ALBUTEROL 0.5-2.5 (3) MG/3ML IN SOLN
3.0000 mL | Freq: Once | RESPIRATORY_TRACT | Status: AC
  Administered 2016-08-15: 3 mL via RESPIRATORY_TRACT
  Filled 2016-08-15: qty 3

## 2016-08-15 MED ORDER — GUAIFENESIN ER 600 MG PO TB12
1200.0000 mg | ORAL_TABLET | Freq: Two times a day (BID) | ORAL | Status: DC
  Administered 2016-08-15 – 2016-08-22 (×14): 1200 mg via ORAL
  Filled 2016-08-15 (×14): qty 2

## 2016-08-15 MED ORDER — METOPROLOL TARTRATE 50 MG PO TABS
50.0000 mg | ORAL_TABLET | Freq: Once | ORAL | Status: AC
  Administered 2016-08-15: 50 mg via ORAL
  Filled 2016-08-15: qty 1

## 2016-08-15 MED ORDER — METOPROLOL TARTRATE 50 MG PO TABS
50.0000 mg | ORAL_TABLET | Freq: Two times a day (BID) | ORAL | Status: DC
  Administered 2016-08-15 – 2016-08-22 (×13): 50 mg via ORAL
  Filled 2016-08-15 (×14): qty 1

## 2016-08-15 MED ORDER — METHYLPREDNISOLONE SODIUM SUCC 125 MG IJ SOLR
60.0000 mg | Freq: Four times a day (QID) | INTRAMUSCULAR | Status: DC
  Administered 2016-08-15 – 2016-08-16 (×2): 60 mg via INTRAVENOUS
  Filled 2016-08-15 (×2): qty 2

## 2016-08-15 MED ORDER — TEMAZEPAM 15 MG PO CAPS
15.0000 mg | ORAL_CAPSULE | Freq: Every day | ORAL | Status: DC
  Administered 2016-08-15 – 2016-08-21 (×7): 15 mg via ORAL
  Filled 2016-08-15 (×7): qty 1

## 2016-08-15 MED ORDER — VANCOMYCIN HCL IN DEXTROSE 1-5 GM/200ML-% IV SOLN
1000.0000 mg | Freq: Once | INTRAVENOUS | Status: AC
  Administered 2016-08-15: 1000 mg via INTRAVENOUS
  Filled 2016-08-15: qty 200

## 2016-08-15 MED ORDER — DEXTROSE 5 % IV SOLN
2.0000 g | INTRAVENOUS | Status: DC
  Administered 2016-08-15 – 2016-08-21 (×7): 2 g via INTRAVENOUS
  Filled 2016-08-15 (×8): qty 2

## 2016-08-15 MED ORDER — HYDROCODONE-ACETAMINOPHEN 5-325 MG PO TABS
1.0000 | ORAL_TABLET | ORAL | Status: DC | PRN
  Administered 2016-08-15 – 2016-08-17 (×6): 1 via ORAL
  Administered 2016-08-17 – 2016-08-21 (×20): 2 via ORAL
  Administered 2016-08-22: 1 via ORAL
  Administered 2016-08-22 (×2): 2 via ORAL
  Filled 2016-08-15: qty 2
  Filled 2016-08-15: qty 1
  Filled 2016-08-15 (×2): qty 2
  Filled 2016-08-15: qty 1
  Filled 2016-08-15 (×2): qty 2
  Filled 2016-08-15: qty 1
  Filled 2016-08-15 (×2): qty 2
  Filled 2016-08-15: qty 1
  Filled 2016-08-15 (×12): qty 2
  Filled 2016-08-15: qty 1
  Filled 2016-08-15: qty 2
  Filled 2016-08-15 (×2): qty 1
  Filled 2016-08-15 (×2): qty 2

## 2016-08-15 MED ORDER — SODIUM CHLORIDE 0.9 % IV SOLN: 250.0000 mL | INTRAVENOUS | Status: DC | PRN

## 2016-08-15 MED ORDER — PANTOPRAZOLE SODIUM 40 MG PO TBEC
40.0000 mg | DELAYED_RELEASE_TABLET | Freq: Every day | ORAL | Status: DC
  Administered 2016-08-16 – 2016-08-22 (×7): 40 mg via ORAL
  Filled 2016-08-15 (×7): qty 1

## 2016-08-15 MED ORDER — DILTIAZEM HCL ER COATED BEADS 240 MG PO CP24
240.0000 mg | ORAL_CAPSULE | Freq: Every day | ORAL | Status: DC
  Administered 2016-08-16 – 2016-08-22 (×7): 240 mg via ORAL
  Filled 2016-08-15 (×7): qty 1

## 2016-08-15 NOTE — Progress Notes (Signed)
PHARMACY NOTE:  ANTIMICROBIAL RENAL DOSAGE ADJUSTMENT  Current antimicrobial regimen includes a mismatch between antimicrobial dosage and estimated renal function.  As per policy approved by the Pharmacy & Therapeutics and Medical Executive Committees, the antimicrobial dosage will be adjusted accordingly.  Current antimicrobial dosage:  Cefepime 1 gram IV q8h  Indication: HCAP  Renal Function:  Estimated Creatinine Clearance: 42.7 mL/min (A) (by C-G formula based on SCr of 1.09 mg/dL (H)). []      On intermittent HD, scheduled: []      On CRRT    Antimicrobial dosage has been changed to:  Cefepime 2 gram IV q24h  Additional comments:   Thank you for allowing pharmacy to be a part of this patient's care.  Vernie Ammons, [facility] 08/15/2016 7:46 PM

## 2016-08-15 NOTE — ED Notes (Signed)
During the attempt to ambulate patient, she had an oxygen saturation of 82%. She took one step forward and stated that she "could not go any further." Oxygen saturation went up to 89% once she was back in bed.

## 2016-08-15 NOTE — ED Notes (Signed)
RT aware of tx 

## 2016-08-15 NOTE — H&P (Signed)
History and Physical    Lindsay Salazar:[id number] DOB: 05-Dec-1950 DOA: 08/15/2016  PCP: [name], PA-C   Patient coming from: Home  Chief Complaint: Productive cough, SOB   HPI: Lindsay Salazar is a 66 y.o. female with medical history significant for pancreatic adenocarcinoma on chemotherapy, COPD with chronic hypoxic respiratory failure, chronic atrial fibrillation on Xarelto, anemia of chronic disease, chronic diastolic CHF, chronic kidney disease stage III, and depression with anxiety who presents to the emergency department for evaluation of productive cough, wheezing, and shortness of breath. Patient has had frequent ED visits and hospitalizations in recent months and was actually just discharged from the hospital yesterday after a 6 day admission for acute exacerbation and COPD. The patient had improved considerably and was felt to be stable for discharge. It was advised that she be discharged to an SNF and physical therapy had recommended this, however, the patient refused this and was discharged back home. She reports being unable to attend to her basic ADLs back at home due to a re-worsening in her dyspnea and a worsening cough that has been productive of thick creamy white sputum. She denies any fevers or chills and denies any chest pain or palpitations. She does note wheezing and reports no significant relief from her home inhalers. She called EMS and they found her saturating in the mid 80s on room air despite her known requirement for 2 L/m of supplemental oxygen around the clock. She was treated with albuterol neb en route.  ED Course: Upon arrival to the ED, patient is found to be afebrile, saturating 97% on 3 L/m of supplemental oxygen, tachypneic in the mid 20s, and atrial fibrillation with a rate as high as 1:30, and with stable blood pressure. EKG features atrial fibrillation with nonspecific repolarization abnormality. Chest x-ray is notable for increased bibasilar reticular  densities, right greater than left, concerning for worsening edema or pneumonia. Chemistry panels notable for a BUN of 36 and creatinine of 1.09 which appears consistent with her baseline. CBC is notable for a normocytic anemia with hemoglobin of 9.3, at the lower end of her recent priors. Troponin is undetectable and BNP is elevated to 625. Patient was given a DuoNeb in the ED, Lopressor 50 mg orally, and 125 mg IV Solu-Medrol. She seemed to have some improvement with this and her heart rate normalized, but she only took a couple steps before becoming significantly symptomatic with dyspnea, tachycardia, and drop in O2 sat to 82%. Blood pressure has remained stable and the patient is not in acute distress while at rest. She will be admitted to the telemetry unit for ongoing evaluation and management of COPD with acute exacerbation, possibly precipitated by HCAP, and complicated by atrial fibrillation with RVR.  Review of Systems:  All other systems reviewed and apart from HPI, are negative.  Past Medical History:  Diagnosis Date  . Anemia of chronic disease   . Arthritis    bursitis , fibromyalgia  . Arthritis    "right hip" (11/27/2015)  . Asthma   . Chronic bronchitis ([place])   . Chronic respiratory failure ([place])   . Chronic right hip pain   . COPD (chronic obstructive pulmonary disease) ([place])    wears home o2 prn  . Daily headache    "last 2-3 months" (11/27/2015)  . DVT (deep venous thrombosis) ([place]) 03/2016  . Exposure to TB    "mom had it when she was pregnant with me"  . Fibromyalgia   . GERD (  gastroesophageal reflux disease)    use to have it but not now  . Heart murmur    80-  50 years old  . Hyperkalemia   . Hypertension   . On home oxygen therapy    11/27/2015 "whenever I need it; don't know how many liters"  . PAF (paroxysmal atrial fibrillation) ([place])   . Pancreatic cancer ([place])   . Paroxysmal atrial flutter ([place])   . Pneumonia    "several times" (11/27/2015)  .  Pneumothorax     Past Surgical History:  Procedure Laterality Date  . ANTERIOR CERVICAL DECOMP/DISCECTOMY FUSION     "put 4 screws in"  . CARDIAC CATHETERIZATION  08/2004   Archie Endo 06/18/2010  . CARDIOVERSION N/A 07/18/2015   Procedure: CARDIOVERSION;  Surgeon: Josue Hector, MD;  Location[facility];  Service: Cardiovascular;  Laterality: N/A;  . ESOPHAGOGASTRODUODENOSCOPY N/A 08/09/2015   Procedure: ESOPHAGOGASTRODUODENOSCOPY (EGD);  Surgeon: Rogene Houston, MD;  Location: [facility];  Service: Endoscopy;  Laterality: N/A;  . EUS N/A 11/29/2015   Procedure: UPPER ENDOSCOPIC ULTRASOUND (EUS) RADIAL;  Surgeon: Milus Banister, MD;  Location: [facility];  Service: Endoscopy;  Laterality: N/A;  . IR GENERIC HISTORICAL  12/18/2015   IR US GUIDE VASC ACCESS RIGHT 12/18/2015 Sandi Mariscal, MD [facility]  . IR GENERIC HISTORICAL  12/18/2015   IR FLUORO GUIDE PORT INSERTION RIGHT 12/18/2015 Sandi Mariscal, MD [facility]  . TONSILLECTOMY    . TUBAL LIGATION       reports that she quit smoking about 14 months ago. Her smoking use included Cigarettes. She started smoking about 46 years ago. She has a 23.00 pack-year smoking history. She has never used smokeless tobacco. She reports that she does not drink alcohol or use drugs.  Allergies  Allergen Reactions  . Ibuprofen Nausea And Vomiting  . Other     Seafood   . Penicillins Swelling    Has patient had a PCN reaction causing immediate rash, facial/tongue/throat swelling, SOB or lightheadedness with hypotension: Yes Has patient had a PCN reaction causing severe rash involving mucus membranes or skin necrosis: No Has patient had a PCN reaction that required hospitalization No Has patient had a PCN reaction occurring within the last 10 years: No If all of the above answers are "NO", then may proceed with Cephalosporin use.   . Tiotropium Bromide Monohydrate Itching  . Tramadol Nausea And Vomiting    Family History  Problem  Relation Age of Onset  . COPD Mother   . Diabetes Father   . Stroke Father      Prior to Admission medications   Medication Sig Start Date End Date Taking? Authorizing Provider  COMBIVENT RESPIMAT 20-100 MCG/ACT AERS respimat Inhale 1 puff into the lungs every 6 (six) hours as needed for wheezing. 08/14/16   Emokpae, Ejiroghene E, MD  diltiazem (CARDIZEM CD) 240 MG 24 hr capsule Take 1 capsule (240 mg total) by mouth daily. 05/23/16 08/21/16  Lendon Colonel, NP  docusate sodium (COLACE) 100 MG capsule Take 1 capsule (100 mg total) by mouth 2 (two) times daily. 07/22/16   Isaac Bliss, Rayford Halsted, MD  ergocalciferol (VITAMIN D2) 50000 units capsule Take 1 capsule (50,000 Units total) by mouth once a week. 08/07/16   Baird Cancer, PA-C  escitalopram (LEXAPRO) 10 MG tablet Take 1 tablet by mouth daily. 08/04/16   [provider]  furosemide (LASIX) 40 MG tablet Take 1 tablet (40 mg total) by mouth daily. 08/16/16  Emokpae, Ejiroghene E, MD  guaiFENesin (MUCINEX) 600 MG 12 hr tablet Take 2 tablets (1,200 mg total) by mouth 2 (two) times daily. 08/14/16   Emokpae, Ejiroghene E, MD  HYDROcodone-acetaminophen (NORCO/VICODIN) 5-325 MG tablet Take 1 tablet by mouth every 4 (four) hours as needed. 07/24/16   Nat Christen, MD  ipratropium-albuterol (DUONEB) 0.5-2.5 (3) MG/3ML SOLN Inhale 3 mLs into the lungs every 6 (six) hours as needed (for shortness of breath).  03/03/16   [provider]  levofloxacin (LEVAQUIN) 500 MG tablet Take 1 tablet (500 mg total) by mouth every other day. 08/16/16 08/17/16  Emokpae, Ejiroghene E, MD  LORazepam (ATIVAN) 0.5 MG tablet Take 0.5 mg by mouth every 6 (six) hours as needed for anxiety.    [provider]  LORazepam (ATIVAN) 0.5 MG tablet Take 1 tablet (0.5 mg total) by mouth every 6 (six) hours as needed for anxiety. 08/14/16   Emokpae, Ejiroghene E, MD  metoprolol tartrate (LOPRESSOR) 50 MG tablet Take 1 tablet (50 mg total) by mouth 2 (two)  times daily. 03/02/16   Kathie Dike, MD  OXYGEN Inhale 2 L into the lungs daily as needed (for breathing).    [provider]  pantoprazole (PROTONIX) 40 MG tablet Take 1 tablet (40 mg total) by mouth daily. 12/03/15   Rai, Ripudeep K, MD  potassium chloride SA (K-DUR,KLOR-CON) 20 MEQ tablet Take 1 tablet (20 mEq total) by mouth daily. 08/16/16   Emokpae, Ejiroghene E, MD  Rivaroxaban (XARELTO) 15 MG TABS tablet Take 1 tablet (15 mg total) by mouth daily with supper. 05/01/16   Orson Eva, MD  SYMBICORT 160-4.5 MCG/ACT inhaler Inhale 1 puff into the lungs 2 (two) times daily. 12/03/15   Rai, Ripudeep K, MD  temazepam (RESTORIL) 15 MG capsule Take 15 mg by mouth at bedtime.    [provider]    Physical Exam: Vitals:   08/15/16 1740 08/15/16 1745 08/15/16 1752 08/15/16 1822  BP:      Pulse: 78 88 100   Resp: (!) 23 (!) 29 20   Temp:      TempSrc:      SpO2: 93% 90% 95% 95%      Constitutional: Not in acute distress while at rest. Mildly tachypneic, audible wheezes. No pallor or diaphoresis.  Eyes: PERTLA, lids and conjunctivae normal ENMT: Mucous membranes are moist. Posterior pharynx clear of any exudate or lesions.   Neck: normal, supple, no masses, no thyromegaly Respiratory: Breath sounds diminished bilaterally. Coarse rhonchi in bilateral bases. Expiratory wheezes. Mild tachypnea. No accessory muscle recruitment.  Cardiovascular: Rate ~110 and irregular. No extremity edema. No significant JVD. Abdomen: No distension, no tenderness, no masses palpated. Bowel sounds normal.  Musculoskeletal: no clubbing / cyanosis. No joint deformity upper and lower extremities.   Skin: Ecchymoses to forearms. Warm, dry, well-perfused. Neurologic: CN 2-12 grossly intact. Sensation intact, DTR normal. Strength 5/5 in all 4 limbs.  Psychiatric: Alert and oriented x 3. Calm and cooperative.     Labs on Admission: I have personally reviewed following labs and imaging  studies  CBC:  Recent Labs Lab 08/09/16 1320 08/15/16 1602  WBC 9.5 10.4  NEUTROABS  --  8.9*  HGB 12.3 9.3*  HCT 38.6 29.5*  MCV 92.1 93.7  PLT 198 332   Basic Metabolic Panel:  Recent Labs Lab 08/10/16 0045 08/11/16 0557 08/12/16 0615 08/13/16 0748 08/15/16 1602  NA 132* 132* 131* 130* 136  K 4.7 5.3* 4.9 4.6 3.5  CL 97* 101  103 102 103  CO2 21* 23 22 21* 25  GLUCOSE 252* 151* 136* 129* 112*  BUN 27* 48* 57* 53* 36*  CREATININE 1.92* 2.32* 2.03* 1.47* 1.09*  CALCIUM 8.6* 8.0* 7.9* 8.2* 8.7*  MG 1.3*  --  2.3  --   --    GFR: Estimated Creatinine Clearance: 41.6 mL/min (A) (by C-G formula based on SCr of 1.09 mg/dL (H)). Liver Function Tests: No results for input(s): AST, ALT, ALKPHOS, BILITOT, PROT, ALBUMIN in the last 168 hours. No results for input(s): LIPASE, AMYLASE in the last 168 hours. No results for input(s): AMMONIA in the last 168 hours. Coagulation Profile: No results for input(s): INR, PROTIME in the last 168 hours. Cardiac Enzymes:  Recent Labs Lab 08/09/16 1320 08/09/16 2011 08/10/16 0045 08/15/16 1602  TROPONINI <0.03 <0.03 <0.03 <0.03   BNP (last 3 results) No results for input(s): PROBNP in the last 8760 hours. HbA1C: No results for input(s): HGBA1C in the last 72 hours. CBG:  Recent Labs Lab 08/13/16 1107 08/13/16 1614 08/13/16 2211 08/14/16 0802 08/14/16 1128  GLUCAP 186* 111* 162* 143* 183*   Lipid Profile: No results for input(s): CHOL, HDL, LDLCALC, TRIG, CHOLHDL, LDLDIRECT in the last 72 hours. Thyroid Function Tests: No results for input(s): TSH, T4TOTAL, FREET4, T3FREE, THYROIDAB in the last 72 hours. Anemia Panel: No results for input(s): VITAMINB12, FOLATE, FERRITIN, TIBC, IRON, RETICCTPCT in the last 72 hours. Urine analysis:    Component Value Date/Time   COLORURINE YELLOW 08/11/2016 1604   APPEARANCEUR HAZY (A) 08/11/2016 1604   LABSPEC 1.016 08/11/2016 1604   PHURINE 5.0 08/11/2016 1604   GLUCOSEU  NEGATIVE 08/11/2016 1604   HGBUR NEGATIVE 08/11/2016 1604   BILIRUBINUR NEGATIVE 08/11/2016 1604   KETONESUR NEGATIVE 08/11/2016 1604   PROTEINUR NEGATIVE 08/11/2016 1604   NITRITE NEGATIVE 08/11/2016 1604   LEUKOCYTESUR LARGE (A) 08/11/2016 1604   Sepsis Labs: @LABRCNTIP (procalcitonin:4,lacticidven:4) ) Recent Results (from the past 240 hour(s))  MRSA PCR Screening     Status: None   Collection Time: 08/09/16  3:49 PM  Result Value Ref Range Status   MRSA by PCR NEGATIVE NEGATIVE Final    Comment:        The GeneXpert MRSA Assay (FDA approved for NASAL specimens only), is one component of a comprehensive MRSA colonization surveillance program. It is not intended to diagnose MRSA infection nor to guide or monitor treatment for MRSA infections.   Gastrointestinal Panel by PCR , Stool     Status: None   Collection Time: 08/09/16  5:40 PM  Result Value Ref Range Status   Campylobacter species NOT DETECTED NOT DETECTED Final   Plesimonas shigelloides NOT DETECTED NOT DETECTED Final   Salmonella species NOT DETECTED NOT DETECTED Final   Yersinia enterocolitica NOT DETECTED NOT DETECTED Final   Vibrio species NOT DETECTED NOT DETECTED Final   Vibrio cholerae NOT DETECTED NOT DETECTED Final   Enteroaggregative E coli (EAEC) NOT DETECTED NOT DETECTED Final   Enteropathogenic E coli (EPEC) NOT DETECTED NOT DETECTED Final   Enterotoxigenic E coli (ETEC) NOT DETECTED NOT DETECTED Final   Shiga like toxin producing E coli (STEC) NOT DETECTED NOT DETECTED Final   Shigella/Enteroinvasive E coli (EIEC) NOT DETECTED NOT DETECTED Final   Cryptosporidium NOT DETECTED NOT DETECTED Final   Cyclospora cayetanensis NOT DETECTED NOT DETECTED Final   Entamoeba histolytica NOT DETECTED NOT DETECTED Final   Giardia lamblia NOT DETECTED NOT DETECTED Final   Adenovirus F40/41 NOT DETECTED NOT DETECTED Final  Astrovirus NOT DETECTED NOT DETECTED Final   Norovirus GI/GII NOT DETECTED NOT DETECTED  Final   Rotavirus A NOT DETECTED NOT DETECTED Final   Sapovirus (I, II, IV, and V) NOT DETECTED NOT DETECTED Final  Culture, Urine     Status: Abnormal   Collection Time: 08/11/16  4:04 PM  Result Value Ref Range Status   Specimen Description URINE, RANDOM  Final   Special Requests NONE  Final   Culture 50,000 COLONIES/mL ESCHERICHIA COLI (A)  Final   Report Status 08/14/2016 FINAL  Final   Organism ID, Bacteria ESCHERICHIA COLI (A)  Final      Susceptibility   Escherichia coli - MIC*    AMPICILLIN 8 SENSITIVE Sensitive     CEFAZOLIN <=4 SENSITIVE Sensitive     CEFTRIAXONE <=1 SENSITIVE Sensitive     CIPROFLOXACIN >=4 RESISTANT Resistant     GENTAMICIN >=16 RESISTANT Resistant     IMIPENEM <=0.25 SENSITIVE Sensitive     NITROFURANTOIN <=16 SENSITIVE Sensitive     TRIMETH/SULFA <=20 SENSITIVE Sensitive     AMPICILLIN/SULBACTAM 4 SENSITIVE Sensitive     PIP/TAZO <=4 SENSITIVE Sensitive     Extended ESBL NEGATIVE Sensitive     * 50,000 COLONIES/mL ESCHERICHIA COLI     Radiological Exams on Admission: Dg Chest 2 View  Result Date: 08/15/2016 CLINICAL DATA:  Cough.  Hypoxia. EXAM: CHEST  2 VIEW COMPARISON:  Radiographs of August 09, 2016. FINDINGS: The heart size and mediastinal contours are within normal limits. Atherosclerosis of thoracic aorta is noted. Right internal jugular Port-A-Cath is unchanged in position. Increased bibasilar reticular densities are noted, right greater than left concerning for worsening edema or pneumonia. No pneumothorax is noted. Small right pleural effusion cannot be excluded. The visualized skeletal structures are unremarkable. IMPRESSION: Increased bibasilar reticular densities are noted, right greater than left, concerning for worsening edema or pneumonia. Electronically Signed   By: Marijo Conception, M.D.   On: 08/15/2016 16:41    EKG: Independently reviewed. Atrial fibrillation with non-specific repolarization abnormality.   Assessment/Plan  1. COPD  with acute exacerbation, acute on chronic hypoxic respiratory failure  - Pt presents with worsening productive cough, SOB, wheezing, and hypoxia consistent with acute exacerbation in COPD  - She had been discharged yesterday, advised to go to SNF, but refused; EMS found her hypoxic on rm air despite her known dependence on 2 Lpm supplemental O2 atc; she also gives conflicting reports regarding her adherence with home medications  - Treated with increased FiO2, 125 mg IV Solu-Medrol, and DuoNeb x2 in ED but continues to be very symptomatic and hypoxic to low 80's on 3 Lpm with minimal exertion  - Plan to check sputum culture and gram stain, continue her scheduled ICS/LABA, systemic steroid with IV Solu-Medrol 60 mg q6h, DuoNebs prn, supplemental O2, and abx as below    2. HCAP  - Afebrile and no leukocytosis, but worsening productive cough, hypoxia, rhonchi, and infiltrates on CXR  - Plan to culture blood and sputum, start empiric vancomycin and cefepime   3. Chronic diastolic CHF  - Appears roughly euvolemic on admission  - BNP elevated on admission, but lower than priors; no peripheral edema or JVD appreciated; opacities on CXR felt to represent PNA over edema  - TTE (05/30/16) with EF 60-65%, mild LVH, mild MR, mild LAE, moderate TR  - Continue current management with Lasix 40 mg qD, metoprolol  - SLIV, fluid-restrict diet, and follow daily wts and I/O's    4. Atrial  fibrillation with RVR  - Pt with chronic atrial fibrillation, rates up to 130's in ED, likely secondary to underlying COPD with acute exacerbation/HCAP - She was given a dose of Lopressor 50 mg PO in ED with improvement in HR to 80's-110's  - CHADS-VASc at least 3 (age, gender, CHF)  - Continue Xarelto, diltiazem, and Lopressor  - Monitor on telemetry, treat underlying pulmonary disease, and use prn Lopressor IVP's for HR >115 with adequate BP   5. CKD stage III  - SCr is 1.09 on admission, consistent with her apparent baseline   - Renally-dose medications as needed, avoid dehydration, avoid nephrotoxic agents where feasible    6. Pancreatic cancer  - S/P EUS by Dr. Ardis Hughs on 11/29/2015 with FNA demonstrating adenocarcinoma - Re-evaluated for distal pancreatectomy by Dr. Barry Dienes on 03/19/2016.  - CA 19-9 elevated initiation of treatment at 197 - Started on gemcitabine/Abraxane beginning on 09/81/1914  - Course complicated by multiple hospitalizations unrelated to her malignancy or treatment - Last treatment was on 05/15/2016 with Gemcitabine/Abraxane - Medonc planning restaging MRI abd and if stable, refer back to Dr. Barry Dienes  7. Anemia of chronic disease  - Hgb is 9.3 on admission, at the lower end of recent priors  - No bleeding is evident    8. Depression, anxiety  - Stable, continue Lexapro and prn Ativan    DVT prophylaxis: Xarelto  Code Status: Full  Family Communication: Discussed with patient Disposition Plan: Admit to telemetry Consults called: None Admission status: Inpatient    Vianne Bulls, MD [facility] Pager [phone]  If 7PM-7AM, please contact night-coverage [email or web address] Password [facility]  08/15/2016, 6:32 PM

## 2016-08-15 NOTE — ED Notes (Signed)
Pt a fib on monitor from 90-120.

## 2016-08-15 NOTE — ED Provider Notes (Signed)
[facility] DEPT Provider Note   CSN: [id number] Arrival date & time: 08/15/16  1528     History   Chief Complaint Chief Complaint  Patient presents with  . Shortness of Breath    HPI Lindsay Salazar is a 66 y.o. female.  HPI Pt was seen at 1550. Per EMS and pt report, c/o gradual onset and persistence of constant cough and SOB since discharge from the hospital yesterday. Pt states she "usually stays a week when I have a COPD attack." EMS states pt was not wearing her home O2 N/C; pt states she had "just taken it off to walk to the ambulance." EMS gave short neb en route with partial relief. Pt states she "didn't get enough meds to go home with" but then states she filled her prescriptions and took all her meds today. Denies fevers, no palpitations/CP, no abd pain, no N/V/D, no back pain.   Past Medical History:  Diagnosis Date  . Anemia of chronic disease   . Arthritis    bursitis , fibromyalgia  . Arthritis    "right hip" (11/27/2015)  . Asthma   . Chronic bronchitis ([place])   . Chronic respiratory failure ([place])   . Chronic right hip pain   . COPD (chronic obstructive pulmonary disease) ([place])    wears home o2 prn  . Daily headache    "last 2-3 months" (11/27/2015)  . DVT (deep venous thrombosis) ([place]) 03/2016  . Exposure to TB    "mom had it when she was pregnant with me"  . Fibromyalgia   . GERD (gastroesophageal reflux disease)    use to have it but not now  . Heart murmur    46-  77 years old  . Hyperkalemia   . Hypertension   . On home oxygen therapy    11/27/2015 "whenever I need it; don't know how many liters"  . PAF (paroxysmal atrial fibrillation) ([place])   . Pancreatic cancer ([place])   . Paroxysmal atrial flutter ([place])   . Pneumonia    "several times" (11/27/2015)  . Pneumothorax     Patient Active Problem List   Diagnosis Date Noted  . AKI (acute kidney injury) ([place])   . 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 Impaired glucose tolerance 08/10/2016  . Acute renal failure superimposed on stage 3 chronic kidney disease ([place]) 08/10/2016  . Obstructive chronic bronchitis with exacerbation ([place])   . Impaired glucose tolerance test   . Acute chest pain   . Low vitamin D level 08/07/2016  . CKD (chronic kidney disease) stage 3, GFR 30-59 ml/min 07/21/2016  . Malnutrition of moderate degree 06/04/2016  . Cellulitis of left foot 05/26/2016  . Acute exacerbation of chronic obstructive pulmonary disease (COPD) ([place]) 05/09/2016  . Cellulitis 04/26/2016  . Chronic respiratory failure with hypoxia ([place]) 04/26/2016  . Hypoxemia 03/30/2016  . Acute on chronic respiratory failure with hypoxia ([place]) 03/29/2016  . Leukocytosis 03/29/2016  . Anemia of chronic disease 03/29/2016  . Essential hypertension 03/29/2016  . HCAP (healthcare-associated pneumonia) 03/28/2016  . B12 deficiency 01/24/2016  . COPD with acute exacerbation ([place]) 01/20/2016  . Pancreatic cancer ([place]) 12/07/2015  . Chronic anticoagulation-Xarelto 09/06/2015  . Gastrointestinal hemorrhage with melena 08/08/2015  . Atrial fibrillation ([place]) 07/17/2015  . Atrial fibrillation with RVR ([place]) 06/27/2015    Past Surgical History:  Procedure Laterality Date  . ANTERIOR CERVICAL DECOMP/DISCECTOMY FUSION     "put  4 screws in"  . CARDIAC CATHETERIZATION  08/2004   Archie Endo 06/18/2010  . CARDIOVERSION N/A 07/18/2015   Procedure: CARDIOVERSION;  Surgeon: Josue Hector, MD;  Location: [facility];  Service: Cardiovascular;  Laterality: N/A;  . ESOPHAGOGASTRODUODENOSCOPY N/A 08/09/2015   Procedure: ESOPHAGOGASTRODUODENOSCOPY (EGD);  Surgeon: Rogene Houston, MD;  Location: [facility] ENDO SUITE;  Service: Endoscopy;  Laterality: N/A;  . EUS N/A 11/29/2015   Procedure: UPPER ENDOSCOPIC ULTRASOUND (EUS) RADIAL;  Surgeon: Milus Banister, MD;  Location: [facility] ENDOSCOPY;  Service: Endoscopy;  Laterality: N/A;  . IR GENERIC  HISTORICAL  12/18/2015   IR US GUIDE VASC ACCESS RIGHT 12/18/2015 Sandi Mariscal, MD [facility]  . IR GENERIC HISTORICAL  12/18/2015   IR FLUORO GUIDE PORT INSERTION RIGHT 12/18/2015 Sandi Mariscal, MD [facility]  . TONSILLECTOMY    . TUBAL LIGATION      OB History    Gravida Para Term Preterm AB Living   2         2   SAB TAB Ectopic Multiple Live Births                   Home Medications    Prior to Admission medications   Medication Sig Start Date End Date Taking? Authorizing Provider  COMBIVENT RESPIMAT 20-100 MCG/ACT AERS respimat Inhale 1 puff into the lungs every 6 (six) hours as needed for wheezing. 08/14/16   Emokpae, Ejiroghene E, MD  diltiazem (CARDIZEM CD) 240 MG 24 hr capsule Take 1 capsule (240 mg total) by mouth daily. 05/23/16 08/21/16  Lendon Colonel, NP  docusate sodium (COLACE) 100 MG capsule Take 1 capsule (100 mg total) by mouth 2 (two) times daily. 07/22/16   Isaac Bliss, Rayford Halsted, MD  ergocalciferol (VITAMIN D2) 50000 units capsule Take 1 capsule (50,000 Units total) by mouth once a week. 08/07/16   Baird Cancer, PA-C  escitalopram (LEXAPRO) 10 MG tablet Take 1 tablet by mouth daily. 08/04/16   [provider]  furosemide (LASIX) 40 MG tablet Take 1 tablet (40 mg total) by mouth daily. 08/16/16   Emokpae, Ejiroghene E, MD  guaiFENesin (MUCINEX) 600 MG 12 hr tablet Take 2 tablets (1,200 mg total) by mouth 2 (two) times daily. 08/14/16   Emokpae, Ejiroghene E, MD  HYDROcodone-acetaminophen (NORCO/VICODIN) 5-325 MG tablet Take 1 tablet by mouth every 4 (four) hours as needed. 07/24/16   Nat Christen, MD  ipratropium-albuterol (DUONEB) 0.5-2.5 (3) MG/3ML SOLN Inhale 3 mLs into the lungs every 6 (six) hours as needed (for shortness of breath).  03/03/16   [provider]  levofloxacin (LEVAQUIN) 500 MG tablet Take 1 tablet (500 mg total) by mouth every other day. 08/16/16 08/17/16  Emokpae, Ejiroghene E, MD  LORazepam (ATIVAN) 0.5 MG tablet Take 0.5 mg  by mouth every 6 (six) hours as needed for anxiety.    [provider]  LORazepam (ATIVAN) 0.5 MG tablet Take 1 tablet (0.5 mg total) by mouth every 6 (six) hours as needed for anxiety. 08/14/16   Emokpae, Ejiroghene E, MD  metoprolol tartrate (LOPRESSOR) 50 MG tablet Take 1 tablet (50 mg total) by mouth 2 (two) times daily. 03/02/16   Kathie Dike, MD  OXYGEN Inhale 2 L into the lungs daily as needed (for breathing).    [provider]  pantoprazole (PROTONIX) 40 MG tablet Take 1 tablet (40 mg total) by mouth daily. 12/03/15   Rai, Vernelle Emerald, MD  potassium chloride SA (K-DUR,KLOR-CON) 20 MEQ tablet Take  1 tablet (20 mEq total) by mouth daily. 08/16/16   Emokpae, Ejiroghene E, MD  Rivaroxaban (XARELTO) 15 MG TABS tablet Take 1 tablet (15 mg total) by mouth daily with supper. 05/01/16   Orson Eva, MD  SYMBICORT 160-4.5 MCG/ACT inhaler Inhale 1 puff into the lungs 2 (two) times daily. 12/03/15   Rai, Ripudeep K, MD  temazepam (RESTORIL) 15 MG capsule Take 15 mg by mouth at bedtime.    [provider]    Family History Family History  Problem Relation Age of Onset  . COPD Mother   . Diabetes Father   . Stroke Father     Social History Social History  Substance Use Topics  . Smoking status: Former Smoker    Packs/day: 0.50    Years: 46.00    Types: Cigarettes    Start date: 10/09/1969    Quit date: 06/02/2015  . Smokeless tobacco: Never Used  . Alcohol use No     Comment: quit 30 years ago     Allergies   Ibuprofen; Other; Penicillins; Tiotropium bromide monohydrate; and Tramadol   Review of Systems Review of Systems ROS: Statement: All systems negative except as marked or noted in the HPI; Constitutional: Negative for fever and chills. ; ; Eyes: Negative for eye pain, redness and discharge. ; ; ENMT: Negative for ear pain, hoarseness, nasal congestion, sinus pressure and sore throat. ; ; Cardiovascular: Negative for chest pain, palpitations, diaphoresis,  and peripheral edema. ; ; Respiratory: +cough, SOB. Negative for wheezing and stridor. ; ; Gastrointestinal: Negative for nausea, vomiting, diarrhea, abdominal pain, blood in stool, hematemesis, jaundice and rectal bleeding. . ; ; Genitourinary: Negative for dysuria, flank pain and hematuria. ; ; Musculoskeletal: Negative for back pain and neck pain. Negative for swelling and trauma.; ; Skin: Negative for pruritus, rash, abrasions, blisters, bruising and skin lesion.; ; Neuro: Negative for headache, lightheadedness and neck stiffness. Negative for weakness, altered level of consciousness, altered mental status, extremity weakness, paresthesias, involuntary movement, seizure and syncope.       Physical Exam Updated Vital Signs BP 134/75   Pulse (!) 130   Temp 98.4 F (36.9 C) (Oral)   Resp 20   SpO2 92%    Patient Vitals for the past 24 hrs:  BP Temp Temp src Pulse Resp SpO2  08/15/16 1610 - - - (!) 130 - -  08/15/16 1602 - - - 94 20 92 %  08/15/16 1600 134/75 - - - (!) 25 -  08/15/16 1539 - - - 67 (!) 22 (!) 87 %  08/15/16 1532 125/71 98.4 F (36.9 C) Oral 90 (!) 21 -     Physical Exam 1555: Physical examination:  Nursing notes reviewed; Vital signs and O2 SAT reviewed;  Constitutional: Well developed, Well nourished, Well hydrated, In no acute distress; Head:  Normocephalic, atraumatic; Eyes: EOMI, PERRL, No scleral icterus; ENMT: Mouth and pharynx normal, Mucous membranes moist; Neck: Supple, Full range of motion, No lymphadenopathy; Cardiovascular: Irregular rate and rhythm, No gallop; Respiratory: Breath sounds coarse & equal bilaterally, No wheezes. +moist cough during exam.  Speaking full sentences with ease, Normal respiratory effort/excursion; Chest: Nontender, Movement normal; Abdomen: Soft, Nontender, Nondistended, Normal bowel sounds; Genitourinary: No CVA tenderness; Extremities: Pulses normal, No tenderness, No edema, No calf edema or asymmetry.; Neuro: AA&Ox3, Major CN  grossly intact.  Speech clear. No gross focal motor or sensory deficits in extremities.; Skin: Color normal, Warm, Dry.   ED Treatments / Results  Labs (all labs ordered  are listed, but only abnormal results are displayed)   EKG  EKG Interpretation  Date/Time:  Friday August 15 2016 15:38:19 EDT Ventricular Rate:  117 PR Interval:    QRS Duration: 82 QT Interval:  323 QTC Calculation: 451 R Axis:   85 Text Interpretation:  Atrial fibrillation Borderline right axis deviation Nonspecific repol abnormality, diffuse leads Baseline wander When compared with ECG of 08/09/2016 No significant change was found Confirmed by [facility]  MD, Nunzio Cory [phone]) on 08/15/2016 4:36:42 PM       Radiology   Procedures Procedures (including critical care time)  Medications Ordered in ED Medications  ipratropium-albuterol (DUONEB) 0.5-2.5 (3) MG/3ML nebulizer solution 3 mL (not administered)  metoprolol tartrate (LOPRESSOR) tablet 50 mg (50 mg Oral Given 08/15/16 1610)  methylPREDNISolone sodium succinate (SOLU-MEDROL) 125 mg/2 mL injection 125 mg (125 mg Intravenous Given 08/15/16 1611)     Initial Impression / Assessment and Plan / ED Course  I have reviewed the triage vital signs and the nursing notes.  Pertinent labs & imaging results that were available during my care of the patient were reviewed by me and considered in my medical decision making (see chart for details).  MDM Reviewed: nursing note, previous chart and vitals Reviewed previous: labs and ECG Interpretation: x-ray, ECG and labs   Results for orders placed or performed during the hospital encounter of 94/85/46  Basic metabolic panel  Result Value Ref Range   Sodium 136 135 - 145 mmol/L   Potassium 3.5 3.5 - 5.1 mmol/L   Chloride 103 101 - 111 mmol/L   CO2 25 22 - 32 mmol/L   Glucose, Bld 112 (H) 65 - 99 mg/dL   BUN 36 (H) 6 - 20 mg/dL   Creatinine, Ser 1.09 (H) 0.44 - 1.00 mg/dL   Calcium 8.7 (L) 8.9 - 10.3 mg/dL   GFR  calc non Af Amer 51 (L) >60 mL/min   GFR calc Af Amer 59 (L) >60 mL/min   Anion gap 8 5 - 15  Brain natriuretic peptide  Result Value Ref Range   B Natriuretic Peptide 625.0 (H) 0.0 - 100.0 pg/mL  Troponin I  Result Value Ref Range   Troponin I <0.03 <0.03 ng/mL  CBC with Differential  Result Value Ref Range   WBC 10.4 4.0 - 10.5 K/uL   RBC 3.15 (L) 3.87 - 5.11 MIL/uL   Hemoglobin 9.3 (L) 12.0 - 15.0 g/dL   HCT 29.5 (L) 36.0 - 46.0 %   MCV 93.7 78.0 - 100.0 fL   MCH 29.5 26.0 - 34.0 pg   MCHC 31.5 30.0 - 36.0 g/dL   RDW 14.7 11.5 - 15.5 %   Platelets 190 150 - 400 K/uL   Neutrophils Relative % 85 %   Lymphocytes Relative 9 %   Monocytes Relative 6 %   Eosinophils Relative 0 %   Basophils Relative 0 %   Neutro Abs 8.9 (H) 1.7 - 7.7 K/uL   Lymphs Abs 0.9 0.7 - 4.0 K/uL   Monocytes Absolute 0.6 0.1 - 1.0 K/uL   Eosinophils Absolute 0.0 0.0 - 0.7 K/uL   Basophils Absolute 0.0 0.0 - 0.1 K/uL   Dg Chest 2 View Result Date: 08/15/2016 CLINICAL DATA:  Cough.  Hypoxia. EXAM: CHEST  2 VIEW COMPARISON:  Radiographs of August 09, 2016. FINDINGS: The heart size and mediastinal contours are within normal limits. Atherosclerosis of thoracic aorta is noted. Right internal jugular Port-A-Cath is unchanged in position. Increased bibasilar reticular densities are noted, right  greater than left concerning for worsening edema or pneumonia. No pneumothorax is noted. Small right pleural effusion cannot be excluded. The visualized skeletal structures are unremarkable. IMPRESSION: Increased bibasilar reticular densities are noted, right greater than left, concerning for worsening edema or pneumonia. Electronically Signed   By: Marijo Conception, M.D.   On: 08/15/2016 16:41    1805:  Pt given short neb x2, IV solumedrol and her usual BID PO metoprolol (HR 90-110's).  BNP mildly elevated, but improved from previous levels. WBC count normal and pt afebrile; will dose pt's home levaquin. Pt attempted to ambulate:  pt stated she "couldn't go any further" with O2 Sat dropping to 82%. Pt then was assisted back to bed with O2 Sats lowly increasing. Dx and testing d/w pt.  Questions answered.  Verb understanding, agreeable to admit. T/C to [facility] Dr. Myna Hidalgo, case discussed, including:  HPI, pertinent PM/SHx, VS/PE, dx testing, ED course and treatment:  Agreeable to admit.         Final Clinical Impressions(s) / ED Diagnoses   Final diagnoses:  None    New Prescriptions New Prescriptions   No medications on file     Francine Graven, DO 08/20/16 1638

## 2016-08-15 NOTE — Progress Notes (Signed)
Pharmacy Note: Initial antibiotics for Vancomycin per protocol ordered.   Estimated Creatinine Clearance: 41.6 mL/min (A) (by C-G formula based on SCr of 1.09 mg/dL (H)).   Allergies  Allergen Reactions  . Ibuprofen Nausea And Vomiting  . Other     Seafood   . Penicillins Swelling    Has patient had a PCN reaction causing immediate rash, facial/tongue/throat swelling, SOB or lightheadedness with hypotension: Yes Has patient had a PCN reaction causing severe rash involving mucus membranes or skin necrosis: No Has patient had a PCN reaction that required hospitalization No Has patient had a PCN reaction occurring within the last 10 years: No If all of the above answers are "NO", then may proceed with Cephalosporin use.   . Tiotropium Bromide Monohydrate Itching  . Tramadol Nausea And Vomiting    Vitals:   08/15/16 1815 08/15/16 1830  BP:  131/81  Pulse:  94  Resp: (!) 25 (!) 23  Temp:      Anti-infectives    Start     Dose/Rate Route Frequency Ordered Stop   08/15/16 2200  ceFEPIme (MAXIPIME) 1 g in dextrose 5 % 50 mL IVPB     1 g 100 mL/hr over 30 Minutes Intravenous Every 8 hours 08/15/16 1832 08/23/16 2159   08/15/16 1900  vancomycin (VANCOCIN) IVPB 1000 mg/200 mL premix     1,000 mg 200 mL/hr over 60 Minutes Intravenous  Once 08/15/16 1855     08/15/16 1830  levofloxacin (LEVAQUIN) tablet 500 mg  Status:  Discontinued     500 mg Oral  Once 08/15/16 1817 08/15/16 1832     Plan: Initial doses of Vancomycin 1000mg  X 1 ordered. F/U admission orders for further dosing if therapy continued.  Ena Dawley, [facility] 08/15/2016 6:55 PM

## 2016-08-15 NOTE — ED Triage Notes (Signed)
02 per ems was 88% ra. Supposed to be on 02 but ems found her with out it.  EMS gave one albuterol tx in route. c02 23 range with ems. Pt arrived a/o. Just d/c as inpt yesterday. Pt states still sob. Mildly labored breathing but able to finish sentences.

## 2016-08-16 LAB — BASIC METABOLIC PANEL
Anion gap: 8 (ref 5–15)
BUN: 37 mg/dL — AB (ref 6–20)
CALCIUM: 8.1 mg/dL — AB (ref 8.9–10.3)
CO2: 23 mmol/L (ref 22–32)
CREATININE: 1.05 mg/dL — AB (ref 0.44–1.00)
Chloride: 105 mmol/L (ref 101–111)
GFR calc Af Amer: >60 mL/min (ref >60)
GFR, EST NON AFRICAN AMERICAN: 53 mL/min — AB (ref >60)
GLUCOSE: 209 mg/dL — AB (ref 65–99)
Potassium: 4.1 mmol/L (ref 3.5–5.1)
Sodium: 136 mmol/L (ref 135–145)

## 2016-08-16 LAB — CBC WITH DIFFERENTIAL/PLATELET
BASOS ABS: 0 10*3/uL (ref 0.0–0.1)
Basophils Relative: 0 %
EOS PCT: 0 %
Eosinophils Absolute: 0 10*3/uL (ref 0.0–0.7)
HEMATOCRIT: 26.8 % — AB (ref 36.0–46.0)
Hemoglobin: 8.8 g/dL — ABNORMAL LOW (ref 12.0–15.0)
Lymphocytes Relative: 4 %
Lymphs Abs: 0.3 10*3/uL — ABNORMAL LOW (ref 0.7–4.0)
MCH: 29.8 pg (ref 26.0–34.0)
MCHC: 32.8 g/dL (ref 30.0–36.0)
MCV: 90.8 fL (ref 78.0–100.0)
MONO ABS: 0.4 10*3/uL (ref 0.1–1.0)
MONOS PCT: 5 %
Neutro Abs: 6.8 10*3/uL (ref 1.7–7.7)
Neutrophils Relative %: 91 %
PLATELETS: 196 10*3/uL (ref 150–400)
RBC: 2.95 MIL/uL — ABNORMAL LOW (ref 3.87–5.11)
RDW: 15.2 % (ref 11.5–15.5)
WBC: 7.5 10*3/uL (ref 4.0–10.5)

## 2016-08-16 LAB — BLOOD CULTURE ID PANEL (REFLEXED)
Acinetobacter baumannii: NOT DETECTED
CANDIDA ALBICANS: NOT DETECTED
CANDIDA PARAPSILOSIS: NOT DETECTED
CANDIDA TROPICALIS: NOT DETECTED
Candida glabrata: NOT DETECTED
Candida krusei: NOT DETECTED
ENTEROBACTERIACEAE SPECIES: NOT DETECTED
Enterobacter cloacae complex: NOT DETECTED
Enterococcus species: NOT DETECTED
Escherichia coli: NOT DETECTED
HAEMOPHILUS INFLUENZAE: NOT DETECTED
KLEBSIELLA OXYTOCA: NOT DETECTED
KLEBSIELLA PNEUMONIAE: NOT DETECTED
Listeria monocytogenes: NOT DETECTED
METHICILLIN RESISTANCE: DETECTED — AB
NEISSERIA MENINGITIDIS: NOT DETECTED
PROTEUS SPECIES: NOT DETECTED
Pseudomonas aeruginosa: NOT DETECTED
STAPHYLOCOCCUS SPECIES: DETECTED — AB
STREPTOCOCCUS PYOGENES: NOT DETECTED
Serratia marcescens: NOT DETECTED
Staphylococcus aureus (BCID): NOT DETECTED
Streptococcus agalactiae: NOT DETECTED
Streptococcus pneumoniae: NOT DETECTED
Streptococcus species: NOT DETECTED

## 2016-08-16 MED ORDER — VANCOMYCIN HCL IN DEXTROSE 1-5 GM/200ML-% IV SOLN
1000.0000 mg | INTRAVENOUS | Status: DC
  Administered 2016-08-16 – 2016-08-18 (×3): 1000 mg via INTRAVENOUS
  Filled 2016-08-16 (×3): qty 200

## 2016-08-16 MED ORDER — ENSURE ENLIVE PO LIQD
237.0000 mL | Freq: Two times a day (BID) | ORAL | Status: DC
  Administered 2016-08-16 – 2016-08-22 (×7): 237 mL via ORAL

## 2016-08-16 MED ORDER — METHYLPREDNISOLONE SODIUM SUCC 125 MG IJ SOLR
60.0000 mg | Freq: Two times a day (BID) | INTRAMUSCULAR | Status: DC
  Administered 2016-08-16 – 2016-08-17 (×2): 60 mg via INTRAVENOUS
  Filled 2016-08-16 (×2): qty 2

## 2016-08-16 NOTE — Progress Notes (Signed)
PROGRESS NOTE    Lindsay Salazar  MGQ:[id number] DOB: 02-24-1950 DOA: 08/15/2016 PCP: [name], PA-C   Brief Narrative: Lindsay Salazar is a 66 y.o. female with medical history significant for pancreatic adenocarcinoma on chemotherapy, COPD with chronic hypoxic respiratory failure, chronic atrial fibrillation on Xarelto, anemia of chronic disease, chronic diastolic CHF, chronic kidney disease stage III, and depression with anxiety who presents to the emergency department for evaluation of productive cough, wheezing, and shortness of breath. Patient has had frequent ED visits and hospitalizations in recent months and was actually just discharged from the hospital today previously after a 6 day admission for acute exacerbation COPD, PNA., Treated with Levaquin. SNF recommended on discharge by PT, the patient declined. Upon arrival to the ED, patient is found to be afebrile, saturating 97% on 3 L/m of supplemental oxygen, tachypneic in the mid 20s, and atrial fibrillation with a rate as high as 130, and with stable blood pressure. Chest x-ray is notable for increased bibasilar reticular densities, right greater than left, concerning for worsening edema or pneumonia. The location of these opacities are different from the ones on previous imaging. She is being managed for COPD exacerbation, precipitated by HCAP, and complicated by atrial fibrillation with RVR.  Assessment & Plan:   Principal Problem:   COPD with acute exacerbation ([place]) Active Problems:   Atrial fibrillation with RVR (HCC)   Pancreatic cancer ([place])   HCAP (healthcare-associated pneumonia)   Acute on chronic respiratory failure with hypoxia (HCC)   Anemia of chronic disease   Essential hypertension   CKD (chronic kidney disease) stage 3, GFR 30-59 ml/min  HCAP- chest x-ray- Increased bibasilar reticular densities are noted, right greater than left, concerning for worsening edema or pneumonia.with infiltrates. Normal WBC today- 7.5. No  fevers. - IV vancomycin per pharmacy and IV cefepime 7/13 >> - F/u blood culture results,  - Stool cultures pending - Still declining SNF Today  COPD with acute exacerbation, acute on chronic hypoxic respiratory failure - worsening productive cough, SOB, wheezing, and hypoxia consistent with acute exacerbation in COPD. presently on 2.5 L of O2 saturating 99-100%. Mild wheezing on exam today -  change IV Solu-Medrol 60 q6H, to 12 hourly, changed to by mouth tomorrow - DuoNeb's, mucolytics  Chronic diastolic CHF - Appears roughly euvolemic on admission. About baseline BNP.  - TTE (05/30/16) with EF 60-65%, mild LVH, mild MR, mild LAE, moderate TR  - Continue current management with home Lasix 40 mg qD, metoprolol  - SLIV, fluid-restrict diet, and follow daily wts and I/O's     Chronic Atrial fibrillation with RVR - rates increased to 130s this a.m on telemetry. Likely secondary to underlying COPD with acute exacerbation/HCAP. CHADS-VASc at least 15 (age, gender, CHF). - Hemoglobin dropped to 8 from, baseline 9-11 - Hold Xarelto, patient has history of GI bleed, melena. Last bowel movement this morning patient's did not check color. - Cont diltiazem, and Lopressor  - Monitor on telemetry, treat underlying pulmonary disease, and use prn Lopressor IVP's for HR >115 with adequate BP   CKD stage III  - SCr is 1.05, at baseline   - Renally-dose medications as needed, avoid dehydration, avoid nephrotoxic agents where feasible   - Cont home Lasix  Pancreatic cancer  - S/P EUS by Dr. Ardis Hughs on 11/29/2015 with FNA demonstrating adenocarcinoma - Re-evaluated for distal pancreatectomy by Dr. Barry Dienes on 03/19/2016.  - CA 19-9 elevated initiation of treatment at 197 - Started on gemcitabine/Abraxane beginning on 12/19/2015  -  Course complicated by multiple hospitalizations unrelated to her malignancy or treatment - Last treatment was on 05/15/2016 with Gemcitabine/Abraxane - Medonc planning  restaging MRI abd and if stable, refer back to Dr. Barry Dienes - Patient does wish she has an appointment in 2 weeks.  Anemia of chronic disease - Hgb 8 today, from BL- 9-11. - 7/14 Hold xalrelto, she has history of GI blood loss  Depression, anxiety  - Stable, continue Lexapro and prn Ativan   DVT prophylaxis: SCDs, XAlrelto held Code Status: Full  Family Communication: Discussed with patient Disposition Plan: Admit to telemetry Consults called: None Admission status: Inpatient   Consultants:   None  Procedures: None   Antimicrobials:   Iv VAnc, cefepime 7/13 >>  Subjective: Patient tells me this morning that she feels much better. No obvious increased work of breathing. On 2.5 l, O2. Heart rate improved per chart, but just increased this a.m. per telemetry.   Objective: Vitals:   08/15/16 2112 08/16/16 0500 08/16/16 0538 08/16/16 0755  BP:   (!) 115/94   Pulse:   91   Resp:   18   Temp:   98.2 F (36.8 C)   TempSrc:   Oral   SpO2: 99%  99% 100%  Weight:  54.1 kg (119 lb 6 oz)    Height:        Intake/Output Summary (Last 24 hours) at 08/16/16 1054 Last data filed at 08/16/16 0400  Gross per 24 hour  Intake              290 ml  Output                0 ml  Net              290 ml   Filed Weights   08/15/16 1938 08/16/16 0500  Weight: 56.4 kg (124 lb 4.8 oz) 54.1 kg (119 lb 6 oz)   Examination:  General exam: Appears calm and comfortable  Respiratory system:  Minimal expiratory wheeze, diffuse.  Cardiovascular system: S1 & S2 heard,Irregular. No murmurs, rubs, gallops or clicks. No pedal edema. Gastrointestinal system: Abdomen is nondistended, soft and nontender. No organomegaly or masses felt. Normal bowel sounds heard. Central nervous system: Alert and oriented. No focal neurological deficits. Extremities: Symmetric 5 x 5 power. Skin: No rashes, lesions or ulcers Psychiatry: Judgement and insight appear normal. Mood & affect appropriate.   Data Reviewed:  I have personally reviewed following labs and imaging studies  CBC:  Recent Labs Lab 08/09/16 1320 08/15/16 1602 08/16/16 0536  WBC 9.5 10.4 7.5  NEUTROABS  --  8.9* 6.8  HGB 12.3 9.3* 8.8*  HCT 38.6 29.5* 26.8*  MCV 92.1 93.7 90.8  PLT 198 190 361   Basic Metabolic Panel:  Recent Labs Lab 08/10/16 0045 08/11/16 0557 08/12/16 0615 08/13/16 0748 08/15/16 1602 08/16/16 0536  NA 132* 132* 131* 130* 136 136  K 4.7 5.3* 4.9 4.6 3.5 4.1  CL 97* 101 103 102 103 105  CO2 21* 23 22 21* 25 23  GLUCOSE 252* 151* 136* 129* 112* 209*  BUN 27* 48* 57* 53* 36* 37*  CREATININE 1.92* 2.32* 2.03* 1.47* 1.09* 1.05*  CALCIUM 8.6* 8.0* 7.9* 8.2* 8.7* 8.1*  MG 1.3*  --  2.3  --   --   --    Cardiac Enzymes:  Recent Labs Lab 08/09/16 1320 08/09/16 2011 08/10/16 0045 08/15/16 1602  TROPONINI <0.03 <0.03 <0.03 <0.03   CBG:  Recent Labs Lab  08/13/16 1107 08/13/16 1614 08/13/16 2211 08/14/16 0802 08/14/16 1128  GLUCAP 186* 111* 162* 143* 183*   Sepsis Labs:  Recent Labs Lab 08/09/16 1320 08/11/16 0556 08/13/16 0748  PROCALCITON <0.10 <0.10 <0.10    Recent Results (from the past 240 hour(s))  MRSA PCR Screening     Status: None   Collection Time: 08/09/16  3:49 PM  Result Value Ref Range Status   MRSA by PCR NEGATIVE NEGATIVE Final    Comment:        The GeneXpert MRSA Assay (FDA approved for NASAL specimens only), is one component of a comprehensive MRSA colonization surveillance program. It is not intended to diagnose MRSA infection nor to guide or monitor treatment for MRSA infections.   Gastrointestinal Panel by PCR , Stool     Status: None   Collection Time: 08/09/16  5:40 PM  Result Value Ref Range Status   Campylobacter species NOT DETECTED NOT DETECTED Final   Plesimonas shigelloides NOT DETECTED NOT DETECTED Final   Salmonella species NOT DETECTED NOT DETECTED Final   Yersinia enterocolitica NOT DETECTED NOT DETECTED Final   Vibrio species NOT  DETECTED NOT DETECTED Final   Vibrio cholerae NOT DETECTED NOT DETECTED Final   Enteroaggregative E coli (EAEC) NOT DETECTED NOT DETECTED Final   Enteropathogenic E coli (EPEC) NOT DETECTED NOT DETECTED Final   Enterotoxigenic E coli (ETEC) NOT DETECTED NOT DETECTED Final   Shiga like toxin producing E coli (STEC) NOT DETECTED NOT DETECTED Final   Shigella/Enteroinvasive E coli (EIEC) NOT DETECTED NOT DETECTED Final   Cryptosporidium NOT DETECTED NOT DETECTED Final   Cyclospora cayetanensis NOT DETECTED NOT DETECTED Final   Entamoeba histolytica NOT DETECTED NOT DETECTED Final   Giardia lamblia NOT DETECTED NOT DETECTED Final   Adenovirus F40/41 NOT DETECTED NOT DETECTED Final   Astrovirus NOT DETECTED NOT DETECTED Final   Norovirus GI/GII NOT DETECTED NOT DETECTED Final   Rotavirus A NOT DETECTED NOT DETECTED Final   Sapovirus (I, II, IV, and V) NOT DETECTED NOT DETECTED Final  Culture, Urine     Status: Abnormal   Collection Time: 08/11/16  4:04 PM  Result Value Ref Range Status   Specimen Description URINE, RANDOM  Final   Special Requests NONE  Final   Culture 50,000 COLONIES/mL ESCHERICHIA COLI (A)  Final   Report Status 08/14/2016 FINAL  Final   Organism ID, Bacteria ESCHERICHIA COLI (A)  Final      Susceptibility   Escherichia coli - MIC*    AMPICILLIN 8 SENSITIVE Sensitive     CEFAZOLIN <=4 SENSITIVE Sensitive     CEFTRIAXONE <=1 SENSITIVE Sensitive     CIPROFLOXACIN >=4 RESISTANT Resistant     GENTAMICIN >=16 RESISTANT Resistant     IMIPENEM <=0.25 SENSITIVE Sensitive     NITROFURANTOIN <=16 SENSITIVE Sensitive     TRIMETH/SULFA <=20 SENSITIVE Sensitive     AMPICILLIN/SULBACTAM 4 SENSITIVE Sensitive     PIP/TAZO <=4 SENSITIVE Sensitive     Extended ESBL NEGATIVE Sensitive     * 50,000 COLONIES/mL ESCHERICHIA COLI  Culture, blood (routine x 2) Call MD if unable to obtain prior to antibiotics being given     Status: None (Preliminary result)   Collection Time: 08/15/16   7:45 PM  Result Value Ref Range Status   Specimen Description BLOOD RIGHT ARM  Final   Special Requests   Final    Blood Culture results may not be optimal due to an inadequate volume of blood received in culture  bottles   Culture NO GROWTH < 12 HOURS  Final   Report Status PENDING  Incomplete  Culture, blood (routine x 2) Call MD if unable to obtain prior to antibiotics being given     Status: None (Preliminary result)   Collection Time: 08/15/16  7:58 PM  Result Value Ref Range Status   Specimen Description LEFT ANTECUBITAL  Final   Special Requests Blood Culture adequate volume  Final   Culture NO GROWTH < 12 HOURS  Final   Report Status PENDING  Incomplete    Radiology Studies: Dg Chest 2 View  Result Date: 08/15/2016 CLINICAL DATA:  Cough.  Hypoxia. EXAM: CHEST  2 VIEW COMPARISON:  Radiographs of August 09, 2016. FINDINGS: The heart size and mediastinal contours are within normal limits. Atherosclerosis of thoracic aorta is noted. Right internal jugular Port-A-Cath is unchanged in position. Increased bibasilar reticular densities are noted, right greater than left concerning for worsening edema or pneumonia. No pneumothorax is noted. Small right pleural effusion cannot be excluded. The visualized skeletal structures are unremarkable. IMPRESSION: Increased bibasilar reticular densities are noted, right greater than left, concerning for worsening edema or pneumonia. Electronically Signed   By: Marijo Conception, M.D.   On: 08/15/2016 16:41   Scheduled Meds: . diltiazem  240 mg Oral Daily  . docusate sodium  100 mg Oral BID  . escitalopram  10 mg Oral Daily  . feeding supplement (ENSURE ENLIVE)  237 mL Oral BID BM  . furosemide  40 mg Oral Daily  . guaiFENesin  1,200 mg Oral BID  . methylPREDNISolone (SOLU-MEDROL) injection  60 mg Intravenous Q6H  . metoprolol tartrate  50 mg Oral BID  . mometasone-formoterol  2 puff Inhalation BID  . pantoprazole  40 mg Oral Daily  . potassium chloride  SA  20 mEq Oral Daily  . sodium chloride flush  3 mL Intravenous Q12H  . temazepam  15 mg Oral QHS   Continuous Infusions: . sodium chloride    . ceFEPime (MAXIPIME) IV Stopped (08/16/16 0020)  . vancomycin      LOS: 1 day   Bethena Roys, MD [facility] Pager [phone]  If 7PM-7AM, please contact night-coverage [email or web address] Password [facility] 08/16/2016, 10:54 AM

## 2016-08-16 NOTE — Progress Notes (Signed)
Notified Dr. Denton Brick via text of the report given from lab of positive blood cultures.  It has gram positive cocci in the anaerobic bottle.

## 2016-08-16 NOTE — Progress Notes (Signed)
Pharmacy Antibiotic Note  Lindsay Salazar is a 66 y.o. female admitted on 08/15/2016 with pneumonia.  Pharmacy has been consulted for VANCOMYCIN dosing.  Plan: Vancomycin 1000mg  IV q24hrs Monitor labs, progress, c/s  Height: 5\' 4"  (162.6 cm) Weight: 119 lb 6 oz (54.1 kg) IBW/kg (Calculated) : 54.7  Temp (24hrs), Avg:98.3 F (36.8 C), Min:98.2 F (36.8 C), Max:98.4 F (36.9 C)   Recent Labs Lab 08/09/16 1320  08/11/16 0557 08/12/16 0615 08/13/16 0748 08/15/16 1602 08/16/16 0536  WBC 9.5  --   --   --   --  10.4 7.5  CREATININE 1.12*  < > 2.32* 2.03* 1.47* 1.09* 1.05*  < > = values in this interval not displayed.  Estimated Creatinine Clearance: 43.8 mL/min (A) (by C-G formula based on SCr of 1.05 mg/dL (H)).    Allergies  Allergen Reactions  . Ibuprofen Nausea And Vomiting  . Other     Seafood   . Penicillins Swelling    Has patient had a PCN reaction causing immediate rash, facial/tongue/throat swelling, SOB or lightheadedness with hypotension: Yes Has patient had a PCN reaction causing severe rash involving mucus membranes or skin necrosis: No Has patient had a PCN reaction that required hospitalization No Has patient had a PCN reaction occurring within the last 10 years: No If all of the above answers are "NO", then may proceed with Cephalosporin use.   . Tiotropium Bromide Monohydrate Itching  . Tramadol Nausea And Vomiting   Anti-infectives    Start     Dose/Rate Route Frequency Ordered Stop   08/16/16 1000  vancomycin (VANCOCIN) IVPB 1000 mg/200 mL premix     1,000 mg 200 mL/hr over 60 Minutes Intravenous Every 24 hours 08/16/16 0942     08/15/16 2200  ceFEPIme (MAXIPIME) 2 g in dextrose 5 % 50 mL IVPB     2 g 100 mL/hr over 30 Minutes Intravenous Every 24 hours 08/15/16 1832 08/23/16 2159   08/15/16 1900  vancomycin (VANCOCIN) IVPB 1000 mg/200 mL premix     1,000 mg 200 mL/hr over 60 Minutes Intravenous  Once 08/15/16 1855 08/15/16 2153   08/15/16 1830   levofloxacin (LEVAQUIN) tablet 500 mg  Status:  Discontinued     500 mg Oral  Once 08/15/16 1817 08/15/16 1832     Microbiology results:  BCx: pending  UCx: pending   Sputum:    MRSA PCR:   Thank you for allowing pharmacy to be a part of this patient's care.  [name] A 08/16/2016 9:43 AM

## 2016-08-16 NOTE — Progress Notes (Signed)
Initial Nutrition Assessment  DOCUMENTATION CODES:  Not applicable  INTERVENTION:  Ensure Enlive po BID, each supplement provides 350 kcal and 20 grams of protein  Monitor PO intake and follow up as clinically warranted  NUTRITION DIAGNOSIS:  Increased nutrient needs related to cancer and cancer related treatments, chronic illness (copd) as evidenced by estimated needs for these conditions and loss of ~6% in past 9 months  GOAL:  Patient will meet greater than or equal to 90% of their needs  MONITOR:  PO intake  REASON FOR ASSESSMENT:  Consult COPD Protocol  ASSESSMENT:  66 y/o female PMHx pancreatic adenocarcinoma on chemo, COPD w/ chronic resp failure, Afib, CHF, CKD3, depression, anxiety. Represented to ED with cough, wheezing and SOB. Had been discharged the day prior, after a 6 day admission for COPD exacerbation. Pt had been recommended to D/C to SNF, but refused. Readmitted to hospital for COPD exacerbation, HCAP and respiratory faillure.   Pt seen as part of COPD protocol. Reviewed intake documentation from last hospitalization. She had consumed 50% or greater of her meals all but one time. On MST, patient had denied poor appetite or weight loss.   Weight history is highly variable due to fluid holding comorbidities and recurrent admissions. The patient was weighing 125-130 lbs on her diagnosis w/ pancreatic cancer late last fall. Her dry weight on d/c last admission was 117. 75 lbs. This is not a clinically significant amount of weight for timeframe  As her last hospitalizations intake records would suggest, the pt still has a good appetite. However, she remains at very high nutritional risk due to her having cancer of pancreatic origin and her multitude of other co morbidities. WiIll add ensure BID and follow clinically.   NFPE:Unable to assess   Labs: BUN/Creat: Stable. H/H:8.8/26.8 Meds: Lasix, Colace, Methylprednisolone, ppi, KCL, IV abx, IVF   Recent Labs Lab  08/10/16 0045  08/12/16 0615 08/13/16 0748 08/15/16 1602 08/16/16 0536  NA 132*  < > 131* 130* 136 136  K 4.7  < > 4.9 4.6 3.5 4.1  CL 97*  < > 103 102 103 105  CO2 21*  < > 22 21* 25 23  BUN 27*  < > 57* 53* 36* 37*  CREATININE 1.92*  < > 2.03* 1.47* 1.09* 1.05*  CALCIUM 8.6*  < > 7.9* 8.2* 8.7* 8.1*  MG 1.3*  --  2.3  --   --   --   GLUCOSE 252*  < > 136* 129* 112* 209*  < > = values in this interval not displayed.   Diet Order:  Diet Heart Room service appropriate? Yes; Fluid consistency: Thin; Fluid restriction: 1500 mL Fluid  Skin:  Abrasion to arm  Last BM:  7/9  Height:  Ht Readings from Last 1 Encounters:  08/15/16 5\' 4"  (1.626 m)   Weight:  Wt Readings from Last 1 Encounters:  08/16/16 119 lb 6 oz (54.1 kg)   Wt Readings from Last 10 Encounters:  08/16/16 119 lb 6 oz (54.1 kg)  08/14/16 117 lb 11.6 oz (53.4 kg)  08/05/16 119 lb 8 oz (54.2 kg)  07/24/16 123 lb (55.8 kg)  07/21/16 114 lb 11.2 oz (52 kg)  06/08/16 134 lb 4.8 oz (60.9 kg)  06/03/16 120 lb (54.4 kg)  05/26/16 120 lb (54.4 kg)  05/23/16 120 lb (54.4 kg)  05/09/16 131 lb 11.2 oz (59.7 kg)  Dosing weight 117.5 lbs  Ideal Body Weight:  54.54 kg  BMI:  Body mass index  is 20.49 kg/m.  Estimated Nutritional Needs:  Kcal:  1800-2000 kcals (34-38 kcal/kg bw) Protein:  75-85g Pro (1.4-1.6 g/kg bw) Fluid:  1.6 L Fluid (30 ml/kg bw)  EDUCATION NEEDS:  No education needs identified at this time  Burtis Junes RD, LDN, [facility] Pager: [phone] 08/16/2016 10:03 AM

## 2016-08-17 ENCOUNTER — Other Ambulatory Visit: Payer: Self-pay

## 2016-08-17 LAB — GLUCOSE, CAPILLARY: Glucose-Capillary: 363 mg/dL — ABNORMAL HIGH (ref 65–99)

## 2016-08-17 LAB — CBC
HEMATOCRIT: 29.8 % — AB (ref 36.0–46.0)
HEMOGLOBIN: 9.6 g/dL — AB (ref 12.0–15.0)
MCH: 29.8 pg (ref 26.0–34.0)
MCHC: 32.2 g/dL (ref 30.0–36.0)
MCV: 92.5 fL (ref 78.0–100.0)
Platelets: 233 10*3/uL (ref 150–400)
RBC: 3.22 MIL/uL — ABNORMAL LOW (ref 3.87–5.11)
RDW: 15.1 % (ref 11.5–15.5)
WBC: 12 10*3/uL — ABNORMAL HIGH (ref 4.0–10.5)

## 2016-08-17 LAB — BLOOD CULTURE ID PANEL (REFLEXED)
Acinetobacter baumannii: NOT DETECTED
CANDIDA PARAPSILOSIS: NOT DETECTED
CARBAPENEM RESISTANCE: NOT DETECTED
Candida albicans: NOT DETECTED
Candida glabrata: NOT DETECTED
Candida krusei: NOT DETECTED
Candida tropicalis: NOT DETECTED
ENTEROBACTERIACEAE SPECIES: DETECTED — AB
Enterobacter cloacae complex: NOT DETECTED
Enterococcus species: NOT DETECTED
Escherichia coli: DETECTED — AB
Haemophilus influenzae: NOT DETECTED
KLEBSIELLA OXYTOCA: NOT DETECTED
KLEBSIELLA PNEUMONIAE: NOT DETECTED
Listeria monocytogenes: NOT DETECTED
Neisseria meningitidis: NOT DETECTED
PSEUDOMONAS AERUGINOSA: NOT DETECTED
Proteus species: NOT DETECTED
STREPTOCOCCUS PYOGENES: NOT DETECTED
Serratia marcescens: NOT DETECTED
Staphylococcus aureus (BCID): NOT DETECTED
Staphylococcus species: NOT DETECTED
Streptococcus agalactiae: NOT DETECTED
Streptococcus pneumoniae: NOT DETECTED
Streptococcus species: NOT DETECTED

## 2016-08-17 LAB — STREP PNEUMONIAE URINARY ANTIGEN: STREP PNEUMO URINARY ANTIGEN: NEGATIVE

## 2016-08-17 MED ORDER — PREDNISONE 20 MG PO TABS
40.0000 mg | ORAL_TABLET | Freq: Every day | ORAL | Status: DC
  Administered 2016-08-18 – 2016-08-22 (×5): 40 mg via ORAL
  Filled 2016-08-17 (×5): qty 2

## 2016-08-17 MED ORDER — RIVAROXABAN 15 MG PO TABS
15.0000 mg | ORAL_TABLET | Freq: Every day | ORAL | Status: DC
  Administered 2016-08-17 – 2016-08-19 (×3): 15 mg via ORAL
  Filled 2016-08-17 (×3): qty 1

## 2016-08-17 NOTE — Progress Notes (Signed)
CRITICAL VALUE ALERT  Critical Value:  Blood Cultures positive for E.Coli  Date & Time Notied:  08/17/2016  Provider Notified:  Dr. Denton Brick  Orders Received/Actions taken: none MD responded and stated the patient was on Zosyn so she was covered.

## 2016-08-17 NOTE — Progress Notes (Signed)
Pharmacy Antibiotic Note  Lindsay Salazar is a 66 y.o. female admitted on 08/15/2016 with pneumonia / bacteremia.  Pharmacy has been consulted for VANCOMYCIN dosing.  Plan: Continue Vancomycin 1000mg  IV q24hrs Check trough level at steady state Monitor labs, progress, c/s  Height: 5\' 4"  (162.6 cm) Weight: 119 lb 6 oz (54.1 kg) IBW/kg (Calculated) : 54.7  Temp (24hrs), Avg:98.3 F (36.8 C), Min:98.1 F (36.7 C), Max:98.7 F (37.1 C)   Recent Labs Lab 08/11/16 0557 08/12/16 0615 08/13/16 0748 08/15/16 1602 08/16/16 0536  WBC  --   --   --  10.4 7.5  CREATININE 2.32* 2.03* 1.47* 1.09* 1.05*    Estimated Creatinine Clearance: 43.8 mL/min (A) (by C-G formula based on SCr of 1.05 mg/dL (H)).    Allergies  Allergen Reactions  . Ibuprofen Nausea And Vomiting  . Other     Seafood   . Penicillins Swelling    Has patient had a PCN reaction causing immediate rash, facial/tongue/throat swelling, SOB or lightheadedness with hypotension: Yes Has patient had a PCN reaction causing severe rash involving mucus membranes or skin necrosis: No Has patient had a PCN reaction that required hospitalization No Has patient had a PCN reaction occurring within the last 10 years: No If all of the above answers are "NO", then may proceed with Cephalosporin use.   . Tiotropium Bromide Monohydrate Itching  . Tramadol Nausea And Vomiting   Anti-infectives    Start     Dose/Rate Route Frequency Ordered Stop   08/16/16 1000  vancomycin (VANCOCIN) IVPB 1000 mg/200 mL premix     1,000 mg 200 mL/hr over 60 Minutes Intravenous Every 24 hours 08/16/16 0942     08/15/16 2200  ceFEPIme (MAXIPIME) 2 g in dextrose 5 % 50 mL IVPB     2 g 100 mL/hr over 30 Minutes Intravenous Every 24 hours 08/15/16 1832 08/23/16 2159   08/15/16 1900  vancomycin (VANCOCIN) IVPB 1000 mg/200 mL premix     1,000 mg 200 mL/hr over 60 Minutes Intravenous  Once 08/15/16 1855 08/15/16 2153   08/15/16 1830  levofloxacin (LEVAQUIN)  tablet 500 mg  Status:  Discontinued     500 mg Oral  Once 08/15/16 1817 08/15/16 1832     Enterococcus species NOT DETECTED NOT DETECTED   Listeria monocytogenes NOT DETECTED NOT DETECTED   Staphylococcus species NOT DETECTED DETECTED    Comment: Methicillin (oxacillin) resistant coagulase negative staphylococcus. Possible blood culture contaminant (unless isolated from more than one blood culture draw or clinical case suggests pathogenicity). No antibiotic treatment is indicated for blood  culture contaminants.  CRITICAL RESULT CALLED TO, READ BACK BY AND VERIFIED WITH:  Ivonne Andrew, RN 2100 08/16/2016 T. TYSOR   Staphylococcus aureus NOT DETECTED NOT DETECTED   Methicillin resistance NOT DETECTED DETECTED    Comment: CRITICAL RESULT CALLED TO, READ BACK BY AND VERIFIED WITH:  Ivonne Andrew, RN 2100 08/16/2016 T. TYSOR   Streptococcus species NOT DETECTED NOT DETECTED   Streptococcus agalactiae NOT DETECTED NOT DETECTED   Streptococcus pneumoniae NOT DETECTED NOT DETECTED   Streptococcus pyogenes NOT DETECTED NOT DETECTED   Acinetobacter baumannii NOT DETECTED NOT DETECTED   Enterobacteriaceae species NOT DETECTED NOT DETECTED   Enterobacter cloacae complex NOT DETECTED NOT DETECTED   Escherichia coli NOT DETECTED NOT DETECTED   Klebsiella oxytoca NOT DETECTED NOT DETECTED   Klebsiella pneumoniae NOT DETECTED NOT DETECTED   Proteus species NOT DETECTED NOT DETECTED   Serratia marcescens NOT DETECTED NOT DETECTED  Haemophilus influenzae NOT DETECTED NOT DETECTED   Neisseria meningitidis NOT DETECTED NOT DETECTED   Pseudomonas aeruginosa NOT DETECTED NOT DETECTED   Candida albicans NOT DETECTED NOT DETECTED   Candida glabrata NOT DETECTED NOT DETECTED   Candida krusei NOT DETECTED NOT DETECTED   Candida parapsilosis NOT DETECTED NOT DETECTED   Candida tropicalis NOT DETECTED NOT DETECTED     Thank you for allowing pharmacy to be a part of this patient's care.  [name]  A 08/17/2016 9:40 AM

## 2016-08-17 NOTE — Progress Notes (Signed)
PHARMACY - PHYSICIAN COMMUNICATION CRITICAL VALUE ALERT - BLOOD CULTURE IDENTIFICATION (BCID)  Results for orders placed or performed during the hospital encounter of 08/15/16  Blood Culture ID Panel (Reflexed) (Collected: 08/15/2016  7:58 PM)  Result Value Ref Range   Enterococcus species NOT DETECTED NOT DETECTED   Listeria monocytogenes NOT DETECTED NOT DETECTED   Staphylococcus species DETECTED (A) NOT DETECTED   Staphylococcus aureus NOT DETECTED NOT DETECTED   Methicillin resistance DETECTED (A) NOT DETECTED   Streptococcus species NOT DETECTED NOT DETECTED   Streptococcus agalactiae NOT DETECTED NOT DETECTED   Streptococcus pneumoniae NOT DETECTED NOT DETECTED   Streptococcus pyogenes NOT DETECTED NOT DETECTED   Acinetobacter baumannii NOT DETECTED NOT DETECTED   Enterobacteriaceae species NOT DETECTED NOT DETECTED   Enterobacter cloacae complex NOT DETECTED NOT DETECTED   Escherichia coli NOT DETECTED NOT DETECTED   Klebsiella oxytoca NOT DETECTED NOT DETECTED   Klebsiella pneumoniae NOT DETECTED NOT DETECTED   Proteus species NOT DETECTED NOT DETECTED   Serratia marcescens NOT DETECTED NOT DETECTED   Haemophilus influenzae NOT DETECTED NOT DETECTED   Neisseria meningitidis NOT DETECTED NOT DETECTED   Pseudomonas aeruginosa NOT DETECTED NOT DETECTED   Candida albicans NOT DETECTED NOT DETECTED   Candida glabrata NOT DETECTED NOT DETECTED   Candida krusei NOT DETECTED NOT DETECTED   Candida parapsilosis NOT DETECTED NOT DETECTED   Candida tropicalis NOT DETECTED NOT DETECTED   Name of physician (or Provider): Dr Denton Brick  No changes to prescribed antibiotics required: Continue Vancomycin and check trough level  [name] A 08/17/2016  9:42 AM

## 2016-08-17 NOTE — Progress Notes (Addendum)
Notifed Dr. Denton Brick about the patients HR via text page.  Tele rate 120 dinamap 86 Apical 120 and is irregular.  BP 128/71  Prior to this text I had spoke with her and told her that the patients HR is not normalizing even with the metoprolol IV/po.  She has no complaints of chest pain or any other complaints.

## 2016-08-17 NOTE — Progress Notes (Addendum)
PROGRESS NOTE    Lindsay Salazar  DJM:[id number] DOB: [birth year] DOA: 08/15/2016 PCP: [name], PA-C   Brief Narrative: Lindsay Salazar is a 66 y.o. female with medical history significant for pancreatic adenocarcinoma on chemotherapy, COPD with chronic hypoxic respiratory failure, chronic atrial fibrillation on Xarelto, anemia of chronic disease, chronic diastolic CHF, chronic kidney disease stage III, and depression with anxiety who presents to the emergency department for evaluation of productive cough, wheezing, and shortness of breath. Patient has had frequent ED visits and hospitalizations in recent months and was actually just discharged from the hospital today previously after a 6 day admission for acute exacerbation COPD, PNA., Treated with Levaquin. SNF recommended on discharge by PT, the patient declined. Upon arrival to the ED, patient is found to be afebrile, saturating 97% on 3 L/m of supplemental oxygen, tachypneic in the mid 20s, and atrial fibrillation with a rate as high as 130, and with stable blood pressure. Chest x-ray is notable for increased bibasilar reticular densities, right greater than left, concerning for worsening edema or pneumonia. She is being managed for COPD exacerbation, precipitated by HCAP, and complicated by atrial fibrillation with RVR.  Assessment & Plan:   Principal Problem:   COPD with acute exacerbation ([place]) Active Problems:   Atrial fibrillation with RVR (HCC)   Pancreatic cancer ([place])   HCAP (healthcare-associated pneumonia)   Acute on chronic respiratory failure with hypoxia (HCC)   Anemia of chronic disease   Essential hypertension   CKD (chronic kidney disease) stage 3, GFR 30-59 ml/min  HCAP- chest x-ray- Increased bibasilar reticular densities are noted, right greater than left, concerning for worsening edema or pneumonia.with infiltrates. Normal WBC today- 7.5. No fevers. - IV vancomycin per pharmacy and IV cefepime 7/13 >> - Blood culture  results- gram-negative rods, gram-positive cocci - Still declining SNF Today - Considering history of pancreatic cancer, if patient declines/fails to Improved, chest CT. - repeat blood cultures today - PT eval when able  COPD with acute exacerbation, acute on chronic hypoxic respiratory failure - worsening productive cough, SOB, wheezing, and hypoxia consistent with acute exacerbation in COPD. presently on 2.5 L of O2 saturating 99-100%.  - Switch to by mouth steroids - DuoNeb's, mucolytics  Chronic diastolic CHF - Appears roughly euvolemic on admission. About baseline BNP.  - TTE (05/30/16) with EF 60-65%, mild LVH, mild MR, mild LAE, moderate TR  - Continue current management with home Lasix 40 mg qD, metoprolol  - fluid-restrict diet, and follow daily wts and I/O's     Chronic Atrial fibrillation with RVR -reports intermittently elevated. Likely secondary to underlying COPD with acute exacerbation/HCAP. CHADS-VASc at least 46 (age, gender, CHF). Soft blood pressures. - EKG today- rates 108, tele- 130s, but bedside dynamap- 80s. Goal HR <110. Check vitals Q4H to ensure patient is not in RVR. - Resume Xarelto, - Cont diltiazem, and Lopressor  - prn Lopressor IVP's for HR >110  CKD stage III  - SCr is 1.05, at baseline   - Renally-dose medications as needed, avoid dehydration, avoid nephrotoxic agents where feasible   - Cont home Lasix  Pancreatic cancer  - S/P EUS by Dr. Ardis Hughs on 11/29/2015 with FNA demonstrating adenocarcinoma - Re-evaluated for distal pancreatectomy by Dr. Barry Dienes on 03/19/2016.  - CA 19-9 elevated initiation of treatment at 197 - Started on gemcitabine/Abraxane beginning on 22/29/7989  - Course complicated by multiple hospitalizations unrelated to her malignancy or treatment - Last treatment was on 05/15/2016 with Gemcitabine/Abraxane - Medonc  planning restaging MRI abd and if stable, refer back to Dr. Barry Dienes - Patient has an appointment in 2 weeks with Dr.  Barry Dienes.  Anemia of chronic disease - Xalreto held yesterday with hemoglobin dropped to 8, from BL- 9-11. History of GI blood loss. - Hemoglobin 9 today resume anticoagulation - Continue home escitalopram  Depression, anxiety  - Stable, continue Lexapro and prn Ativan   DVT prophylaxis: James Ivanoff resumed Code Status: Full  Family Communication: Discussed with patient Disposition Plan:  patient declining SNF placement.  Consults called: None Admission status: Inpatient   Consultants:   None  Procedures: None   Antimicrobials:   Iv VAnc, cefepime 7/13 >>  Subjective: Patient tells me this morning that she feels much better. No obvious increased work of breathing. On 2.5 l, O2. She complains of body aches from bilateral shoulders to hips.  Objective: Vitals:   08/16/16 2132 08/17/16 0634 08/17/16 0837 08/17/16 1235  BP: 99/60 128/70  128/71  Pulse: (!) 102 (!) 108  86  Resp:  18    Temp:  98.1 F (36.7 C)    TempSrc:  Oral    SpO2:  99% 96%   Weight:      Height:        Intake/Output Summary (Last 24 hours) at 08/17/16 1415 Last data filed at 08/17/16 1116  Gross per 24 hour  Intake             1010 ml  Output              450 ml  Net              560 ml   Filed Weights   08/15/16 1938 08/16/16 0500  Weight: 56.4 kg (124 lb 4.8 oz) 54.1 kg (119 lb 6 oz)   Examination:  General exam: Appears calm and comfortable  Respiratory system:  Minimal expiratory wheeze, diffuse.  Cardiovascular system: S1 & S2 heard,Irregular. No murmurs, rubs, gallops or clicks. No pedal edema. Gastrointestinal system: Abdomen is nondistended, soft and nontender. No organomegaly or masses felt. Normal bowel sounds heard. Central nervous system: Alert and oriented. No focal neurological deficits. Extremities: Symmetric 5 x 5 power. Skin: No rashes, lesions or ulcers Psychiatry: Judgement and insight appear normal. Mood & affect appropriate.   Data Reviewed: I have personally  reviewed following labs and imaging studies  CBC:  Recent Labs Lab 08/15/16 1602 08/16/16 0536  WBC 10.4 7.5  NEUTROABS 8.9* 6.8  HGB 9.3* 8.8*  HCT 29.5* 26.8*  MCV 93.7 90.8  PLT 190 161   Basic Metabolic Panel:  Recent Labs Lab 08/11/16 0557 08/12/16 0615 08/13/16 0748 08/15/16 1602 08/16/16 0536  NA 132* 131* 130* 136 136  K 5.3* 4.9 4.6 3.5 4.1  CL 101 103 102 103 105  CO2 23 22 21* 25 23  GLUCOSE 151* 136* 129* 112* 209*  BUN 48* 57* 53* 36* 37*  CREATININE 2.32* 2.03* 1.47* 1.09* 1.05*  CALCIUM 8.0* 7.9* 8.2* 8.7* 8.1*  MG  --  2.3  --   --   --    Cardiac Enzymes:  Recent Labs Lab 08/15/16 1602  TROPONINI <0.03   CBG:  Recent Labs Lab 08/13/16 1107 08/13/16 1614 08/13/16 2211 08/14/16 0802 08/14/16 1128  GLUCAP 186* 111* 162* 143* 183*   Sepsis Labs:  Recent Labs Lab 08/11/16 0556 08/13/16 0748  PROCALCITON <0.10 <0.10    Recent Results (from the past 240 hour(s))  MRSA PCR Screening  Status: None   Collection Time: 08/09/16  3:49 PM  Result Value Ref Range Status   MRSA by PCR NEGATIVE NEGATIVE Final    Comment:        The GeneXpert MRSA Assay (FDA approved for NASAL specimens only), is one component of a comprehensive MRSA colonization surveillance program. It is not intended to diagnose MRSA infection nor to guide or monitor treatment for MRSA infections.   Gastrointestinal Panel by PCR , Stool     Status: None   Collection Time: 08/09/16  5:40 PM  Result Value Ref Range Status   Campylobacter species NOT DETECTED NOT DETECTED Final   Plesimonas shigelloides NOT DETECTED NOT DETECTED Final   Salmonella species NOT DETECTED NOT DETECTED Final   Yersinia enterocolitica NOT DETECTED NOT DETECTED Final   Vibrio species NOT DETECTED NOT DETECTED Final   Vibrio cholerae NOT DETECTED NOT DETECTED Final   Enteroaggregative E coli (EAEC) NOT DETECTED NOT DETECTED Final   Enteropathogenic E coli (EPEC) NOT DETECTED NOT  DETECTED Final   Enterotoxigenic E coli (ETEC) NOT DETECTED NOT DETECTED Final   Shiga like toxin producing E coli (STEC) NOT DETECTED NOT DETECTED Final   Shigella/Enteroinvasive E coli (EIEC) NOT DETECTED NOT DETECTED Final   Cryptosporidium NOT DETECTED NOT DETECTED Final   Cyclospora cayetanensis NOT DETECTED NOT DETECTED Final   Entamoeba histolytica NOT DETECTED NOT DETECTED Final   Giardia lamblia NOT DETECTED NOT DETECTED Final   Adenovirus F40/41 NOT DETECTED NOT DETECTED Final   Astrovirus NOT DETECTED NOT DETECTED Final   Norovirus GI/GII NOT DETECTED NOT DETECTED Final   Rotavirus A NOT DETECTED NOT DETECTED Final   Sapovirus (I, II, IV, and V) NOT DETECTED NOT DETECTED Final  Culture, Urine     Status: Abnormal   Collection Time: 08/11/16  4:04 PM  Result Value Ref Range Status   Specimen Description URINE, RANDOM  Final   Special Requests NONE  Final   Culture 50,000 COLONIES/mL ESCHERICHIA COLI (A)  Final   Report Status 08/14/2016 FINAL  Final   Organism ID, Bacteria ESCHERICHIA COLI (A)  Final      Susceptibility   Escherichia coli - MIC*    AMPICILLIN 8 SENSITIVE Sensitive     CEFAZOLIN <=4 SENSITIVE Sensitive     CEFTRIAXONE <=1 SENSITIVE Sensitive     CIPROFLOXACIN >=4 RESISTANT Resistant     GENTAMICIN >=16 RESISTANT Resistant     IMIPENEM <=0.25 SENSITIVE Sensitive     NITROFURANTOIN <=16 SENSITIVE Sensitive     TRIMETH/SULFA <=20 SENSITIVE Sensitive     AMPICILLIN/SULBACTAM 4 SENSITIVE Sensitive     PIP/TAZO <=4 SENSITIVE Sensitive     Extended ESBL NEGATIVE Sensitive     * 50,000 COLONIES/mL ESCHERICHIA COLI  Culture, blood (routine x 2) Call MD if unable to obtain prior to antibiotics being given     Status: None (Preliminary result)   Collection Time: 08/15/16  7:45 PM  Result Value Ref Range Status   Specimen Description BLOOD RIGHT ARM  Final   Special Requests   Final    Blood Culture results may not be optimal due to an inadequate volume of blood  received in culture bottles   Culture NO GROWTH 2 DAYS  Final   Report Status PENDING  Incomplete  Culture, blood (routine x 2) Call MD if unable to obtain prior to antibiotics being given     Status: None (Preliminary result)   Collection Time: 08/15/16  7:58 PM  Result Value Ref  Range Status   Specimen Description LEFT ANTECUBITAL  Final   Special Requests Blood Culture adequate volume  Final   Culture  Setup Time   Final    GRAM POSITIVE COCCI Gram Stain Report Called to,Read Back By and Verified With: WATKINS,T. AT 1610 ON 08/16/2016 BY EVA ANAEROBIC BOTTLE ONLY Performed at [facility], READ BACK BY AND VERIFIED WITH: Ivonne Andrew, RN 2100 08/16/2016 T. Sherre Poot NEGATIVE RODS AEROBIC BOTTLE Gram Stain Report Called to,Read Back By and Verified With: HAWKINS,J @ 1218 BY BOWMAN,L    Culture   Final    GRAM POSITIVE COCCI CULTURE REINCUBATED FOR BETTER GROWTH Performed at [facility], [place] [address], [place], [place] [address]    Report Status PENDING  Incomplete  Blood Culture ID Panel (Reflexed)     Status: Abnormal   Collection Time: 08/15/16  7:58 PM  Result Value Ref Range Status   Enterococcus species NOT DETECTED NOT DETECTED Final   Listeria monocytogenes NOT DETECTED NOT DETECTED Final   Staphylococcus species DETECTED (A) NOT DETECTED Final    Comment: Methicillin (oxacillin) resistant coagulase negative staphylococcus. Possible blood culture contaminant (unless isolated from more than one blood culture draw or clinical case suggests pathogenicity). No antibiotic treatment is indicated for blood  culture contaminants. CRITICAL RESULT CALLED TO, READ BACK BY AND VERIFIED WITH: Ivonne Andrew, RN 2100 08/16/2016 T. TYSOR    Staphylococcus aureus NOT DETECTED NOT DETECTED Final   Methicillin resistance DETECTED (A) NOT DETECTED Final    Comment: CRITICAL RESULT CALLED TO, READ BACK BY AND VERIFIED WITH: Ivonne Andrew, RN 2100 08/16/2016  T. TYSOR    Streptococcus species NOT DETECTED NOT DETECTED Final   Streptococcus agalactiae NOT DETECTED NOT DETECTED Final   Streptococcus pneumoniae NOT DETECTED NOT DETECTED Final   Streptococcus pyogenes NOT DETECTED NOT DETECTED Final   Acinetobacter baumannii NOT DETECTED NOT DETECTED Final   Enterobacteriaceae species NOT DETECTED NOT DETECTED Final   Enterobacter cloacae complex NOT DETECTED NOT DETECTED Final   Escherichia coli NOT DETECTED NOT DETECTED Final   Klebsiella oxytoca NOT DETECTED NOT DETECTED Final   Klebsiella pneumoniae NOT DETECTED NOT DETECTED Final   Proteus species NOT DETECTED NOT DETECTED Final   Serratia marcescens NOT DETECTED NOT DETECTED Final   Haemophilus influenzae NOT DETECTED NOT DETECTED Final   Neisseria meningitidis NOT DETECTED NOT DETECTED Final   Pseudomonas aeruginosa NOT DETECTED NOT DETECTED Final   Candida albicans NOT DETECTED NOT DETECTED Final   Candida glabrata NOT DETECTED NOT DETECTED Final   Candida krusei NOT DETECTED NOT DETECTED Final   Candida parapsilosis NOT DETECTED NOT DETECTED Final   Candida tropicalis NOT DETECTED NOT DETECTED Final    Radiology Studies: Dg Chest 2 View  Result Date: 08/15/2016 CLINICAL DATA:  Cough.  Hypoxia. EXAM: CHEST  2 VIEW COMPARISON:  Radiographs of August 09, 2016. FINDINGS: The heart size and mediastinal contours are within normal limits. Atherosclerosis of thoracic aorta is noted. Right internal jugular Port-A-Cath is unchanged in position. Increased bibasilar reticular densities are noted, right greater than left concerning for worsening edema or pneumonia. No pneumothorax is noted. Small right pleural effusion cannot be excluded. The visualized skeletal structures are unremarkable. IMPRESSION: Increased bibasilar reticular densities are noted, right greater than left, concerning for worsening edema or pneumonia. Electronically Signed   By: Marijo Conception, M.D.   On: 08/15/2016 16:41   Scheduled  Meds: . diltiazem  240 mg Oral Daily  .  docusate sodium  100 mg Oral BID  . escitalopram  10 mg Oral Daily  . feeding supplement (ENSURE ENLIVE)  237 mL Oral BID BM  . furosemide  40 mg Oral Daily  . guaiFENesin  1,200 mg Oral BID  . methylPREDNISolone (SOLU-MEDROL) injection  60 mg Intravenous Q12H  . metoprolol tartrate  50 mg Oral BID  . mometasone-formoterol  2 puff Inhalation BID  . pantoprazole  40 mg Oral Daily  . potassium chloride SA  20 mEq Oral Daily  . sodium chloride flush  3 mL Intravenous Q12H  . temazepam  15 mg Oral QHS   Continuous Infusions: . sodium chloride    . ceFEPime (MAXIPIME) IV Stopped (08/17/16 0046)  . vancomycin Stopped (08/17/16 1104)    LOS: 2 days   Bethena Roys, MD [facility] Pager [phone]  If 7PM-7AM, please contact night-coverage [email or web address] Password [email or web address] 08/17/2016, 2:15 PM

## 2016-08-17 NOTE — Progress Notes (Signed)
Notified Dr. Denton Brick of the patients EKG result A. Fib with RVR rate 108.  MD stated that since the dinamap and tele is saying different we will use the median of about 110.  She says its okay to set the alarm at CMT of 140 and to collect the vitals more often.  Patient continues to be stable with no complaints at this time.  Will continue to monitor.

## 2016-08-17 NOTE — Progress Notes (Signed)
CRITICAL VALUE ALERT  Critical Value:  Positive blood culture aerobic gram negative   Date & Time Notied:  08/17/16 1221  Provider Notified: Dr. Denton Brick 1228 notified via text page Orders Received/Actions taken:

## 2016-08-18 DIAGNOSIS — I10 Essential (primary) hypertension: Secondary | ICD-10-CM

## 2016-08-18 DIAGNOSIS — J189 Pneumonia, unspecified organism: Secondary | ICD-10-CM

## 2016-08-18 LAB — GLUCOSE, CAPILLARY
GLUCOSE-CAPILLARY: 160 mg/dL — AB (ref 65–99)
GLUCOSE-CAPILLARY: 195 mg/dL — AB (ref 65–99)
Glucose-Capillary: 248 mg/dL — ABNORMAL HIGH (ref 65–99)

## 2016-08-18 LAB — BASIC METABOLIC PANEL
Anion gap: 10 (ref 5–15)
BUN: 42 mg/dL — AB (ref 6–20)
CALCIUM: 8.3 mg/dL — AB (ref 8.9–10.3)
CHLORIDE: 103 mmol/L (ref 101–111)
CO2: 24 mmol/L (ref 22–32)
CREATININE: 1.06 mg/dL — AB (ref 0.44–1.00)
GFR calc Af Amer: >60 mL/min (ref >60)
GFR, EST NON AFRICAN AMERICAN: 53 mL/min — AB (ref >60)
Glucose, Bld: 200 mg/dL — ABNORMAL HIGH (ref 65–99)
Potassium: 4.7 mmol/L (ref 3.5–5.1)
SODIUM: 137 mmol/L (ref 135–145)

## 2016-08-18 LAB — HIV ANTIBODY (ROUTINE TESTING W REFLEX): HIV Screen 4th Generation wRfx: NONREACTIVE

## 2016-08-18 LAB — VANCOMYCIN, TROUGH: VANCOMYCIN TR: 18 ug/mL (ref 15–20)

## 2016-08-18 MED ORDER — INSULIN ASPART 100 UNIT/ML ~~LOC~~ SOLN
0.0000 [IU] | Freq: Three times a day (TID) | SUBCUTANEOUS | Status: DC
  Administered 2016-08-18: 5 [IU] via SUBCUTANEOUS
  Administered 2016-08-18: 3 [IU] via SUBCUTANEOUS
  Administered 2016-08-19 (×2): 5 [IU] via SUBCUTANEOUS
  Administered 2016-08-19: 3 [IU] via SUBCUTANEOUS

## 2016-08-18 MED ORDER — DILTIAZEM LOAD VIA INFUSION
10.0000 mg | Freq: Once | INTRAVENOUS | Status: AC
  Administered 2016-08-18: 10 mg via INTRAVENOUS
  Filled 2016-08-18: qty 10

## 2016-08-18 MED ORDER — INSULIN ASPART 100 UNIT/ML ~~LOC~~ SOLN
0.0000 [IU] | Freq: Every day | SUBCUTANEOUS | Status: DC
  Administered 2016-08-19: 2 [IU] via SUBCUTANEOUS

## 2016-08-18 MED ORDER — IPRATROPIUM-ALBUTEROL 0.5-2.5 (3) MG/3ML IN SOLN
3.0000 mL | Freq: Three times a day (TID) | RESPIRATORY_TRACT | Status: DC
  Administered 2016-08-19 – 2016-08-20 (×5): 3 mL via RESPIRATORY_TRACT
  Filled 2016-08-18 (×5): qty 3

## 2016-08-18 MED ORDER — DILTIAZEM HCL 100 MG IV SOLR
INTRAVENOUS | Status: AC
  Filled 2016-08-18: qty 100

## 2016-08-18 NOTE — Progress Notes (Signed)
Progress Note    Lindsay Salazar  OVF:[id number] DOB: [birth year]  DOA: 08/15/2016 PCP: [name], PA-C    Brief Narrative:   Chief complaint: Follow-up pneumonia  Medical records reviewed and are as summarized below:  Lindsay Salazar is an 66 y.o. female with a PMH of pancreatic adenocarcinoma on chemotherapy, COPD/chronic respiratory failure with hypoxia, chronic atrial fibrillation on Xarelto, anemia of chronic disease, chronic diastolic CHF, stage III CKD and depression/anxiety who was admitted 08/15/16 with worsening respiratory status. On admission, she was found to have atrial fibrillation with RVR. Chest x-ray showed increased bibasilar reticular densities concerning for edema versus pneumonia. BNP was 625.  Assessment/Plan:   Principal Problem:   COPD with acute exacerbation (HCC)/Healthcare associated pneumonia resulting in acute on chronic hypoxic respiratory failure Patient was placed on empiric vancomycin and cefepime to cover hospital-acquired pathogens. Blood cultures are growing Escherichia coli and coagulase-negative staph and one of 2 bottles. Repeat blood cultures sent. Steroids initiated on admission, weaned to oral prednisone 08/17/16. Continue bronchodilators and mucolytics. Given MRSA negative screen, will discontinue vancomycin.  Active Problems:   Atrial fibrillation with RVR (HCC) CHADS-VASc at least 87 (age, gender, CHF). Heart rate improved from the 130s to the 80s-90s on diltiazem and Lopressor. Continue Xarelto.    Pancreatic cancer [facility]) Last treatment was on 05/15/2016 with Gemcitabine/Abraxane. Plan is to undergo restaging MRI of the abdomen and referred back to Dr. Barry Dienes for consideration of distal pancreatectomy.    Anemia of chronic disease Baseline hemoglobin 9-11 mg/dL. Hemoglobin stable.    Essential hypertension Blood pressure controlled on Cardizem, Lasix and when necessary metoprolol.    CKD (chronic kidney disease) stage 3, GFR 30-59  ml/min Baseline creatinine 1.05. Current creatinine consistent with usual baseline values.    Depression/anxiety Continue Lexapro and when necessary Ativan.    Hyperglycemia Likely steroid-induced. Hemoglobin A1c 5.7% 06/08/16. Will initiate SSI while on steroids.   Family Communication/Anticipated D/C date and plan/Code Status   DVT prophylaxis: Xarelto ordered. Code Status: Full Code.  Family Communication: No family present. Disposition Plan: Says she lives with a boyfriend.  Refuses SNF.   Medical Consultants:    None.   Anti-Infectives:    Vancomycin 08/15/16--->08/18/16  Cefepime 08/15/16--->  Subjective:   Lindsay Salazar says she feels like she is breathing a bit better, but endorses a productive cough with thick green sputum. Continues to have pleuritic type chest pains.  Objective:    Vitals:   08/17/16 1943 08/17/16 2129 08/18/16 0426 08/18/16 0446  BP:  101/61 120/83   Pulse:  88 93   Resp:  20 20   Temp:  98.6 F (37 C) 98.2 F (36.8 C)   TempSrc:  Oral Oral   SpO2: 97% 99% 96% 96%  Weight:   58.7 kg (129 lb 6.6 oz)   Height:        Intake/Output Summary (Last 24 hours) at 08/18/16 0911 Last data filed at 08/18/16 0600  Gross per 24 hour  Intake             1070 ml  Output             1950 ml  Net             -880 ml   Filed Weights   08/15/16 1938 08/16/16 0500 08/18/16 0426  Weight: 56.4 kg (124 lb 4.8 oz) 54.1 kg (119 lb 6 oz) 58.7 kg (129 lb 6.6 oz)    Exam: General  exam: Chronically ill appearing female in no acute distress. Respiratory system: Lungs are diminished with scattered rhonchi. Occasional wheeze. Cardiovascular system: Heart sounds are regular with a normal rate. No murmurs, rubs, or gallops. Gastrointestinal system: Abdomen is soft, nontender, nondistended with normal active bowel sounds. Central nervous system: Alert, nonfocal. Extremities: No clubbing, edema, or cyanosis. Skin: No rashes. Scattered ecchymosis to the upper  extremities. Hirsute. Port-A-Cath right chest wall, not currently accessed. Psychiatry: Mood and affect appropriate. Poor insight and judgment.  Data Reviewed:   I have personally reviewed following labs and imaging studies:  Labs: Labs show the following: Glucose 200, BUN 42, creatinine 1.06. Otherwise electrolytes unremarkable. WBC 12.0, hemoglobin 9.6, hematocrit 29.8. MRSA PCR screen was negative. GI pathogen panel negative.   Culture data:  Urine culture 08/11/16:50,000 colonies of Escherichia coli. Blood culture 08/15/16: No growth 2 days. Blood culture 08/15/16: Coagulase-negative staph and Escherichia coli.   Procedures and diagnostic studies:  Chest x-ray 08/15/16: Increased bibasilar reticular densities right, greater than left. Pneumonia versus edema.   Medications:   . diltiazem  240 mg Oral Daily  . docusate sodium  100 mg Oral BID  . escitalopram  10 mg Oral Daily  . feeding supplement (ENSURE ENLIVE)  237 mL Oral BID BM  . furosemide  40 mg Oral Daily  . guaiFENesin  1,200 mg Oral BID  . metoprolol tartrate  50 mg Oral BID  . mometasone-formoterol  2 puff Inhalation BID  . pantoprazole  40 mg Oral Daily  . potassium chloride SA  20 mEq Oral Daily  . predniSONE  40 mg Oral Daily  . Rivaroxaban  15 mg Oral Q supper  . sodium chloride flush  3 mL Intravenous Q12H  . temazepam  15 mg Oral QHS   Continuous Infusions: . sodium chloride    . ceFEPime (MAXIPIME) IV Stopped (08/18/16 0007)  . diltiazem (CARDIZEM) infusion    . vancomycin Stopped (08/17/16 1104)    Medical decision making is of high complexity and this patient is at high risk of deterioration, therefore this is a level 3 visit.  (> 4 problem points, >4 data points, high risk: Need 2 out of 3)  Problems/DDx Points   Self limited or minor (max 2)       1   Established problem, stable       1  8  Established problem, worsening       2   New problem, no additional W/U planned (max 1) hyperglycemia        3  3  New problem, additional W/U planned        4    Data Reviewed Points   Review/order clinical lab tests       1  1  Review/order x-rays       1   Review/order tests (Echo, EKG, PFTs, etc)       1   Discussion of test results w/ performing MD       1   Independent review of image, tracing or specimen       2  2  Decision to obtain old records       1   Review and summation of old records       2  2   Level of risk Presenting prob Diagnostics Management   Minimal 1 self limited/minor Labs CXR EKG/EEG U/A U/S Rest Gargles Bandages Dressings   Low 2 or more self limited/minor 1 stable chronic Acute uncomplicated illness  Tests (PFTS) Non-CV imaging Arterial labs Biopsies of skin OTC drugs Minor surgery-no risk PT OT IVF without additives    Moderate 1 or more chronic illnesses w/ mild exac, progression or S/E from tx 2 or more stable chronic illnesses Undiagnosed new problem w/ uncertain prognosis Acute complicated injury  Stress tests Endoscopies with no risk factors Deep needle or incisional bx CV imaging without risk LP Thoracentesis Paracentesis Minor surgery w/ risks Elective major surgery w/ no risk (open, percutaneous or endoscopic) Prescription drugs Therapeutic nucl med IVF with additives Closed tx of fracture/dislocation    High Severe exac of chronic illness Acute or chronic illness/injury may pose a threat to life or bodily function (ARF) Change in neuro status    CV imaging w/ contrast and risk Cardio electophysiologic tests Endoscopies w/ risk Discography Elective major surgery Emergency major surgery Parenteral controlled substances Drug therapy req monitoring for toxicity DNR/de-escalation of care    MDM Prob points Data points Risk   Straightforward    <1    <1    Min   Low complexity    2    2    Low   Moderate    3    3    Mod   High Complexity    4 or more    4 or more    High      LOS: 3 days   Abrahim Sargent  [facility] Pager [phone]. If unable to reach me by pager, please call my cell phone at [phone].  *Please refer to [email or web address], password [email or web address] to get updated schedule on who will round on this patient, as hospitalists switch teams weekly. If 7PM-7AM, please contact night-coverage at [email or web address], password [email or web address] for any overnight needs.  08/18/2016, 9:11 AM

## 2016-08-18 NOTE — Care Management Note (Signed)
Case Management Note  Patient Details  Name: Lindsay Salazar MRN: [id number] Date of Birth: 25-Oct-1950  Subjective/Objective:                  Adm with COPD exacerbation/PNA. Patient discharged on 08/14/2016, readmit on 08/15/2016.  Home health was arranged with [facility] on 08/14/2016, but patient returned to hospital prior to being seen by home health. Patient continues to refuse SNF, but agrees to continue home health services at time of discharge. CM discussed multiple readmissions with patient and patient agrees to "try hard and do her best" to be compliant with home health.   Action/Plan:Anticipate discharge home with resumption of home health.    Expected Discharge Date:   08/21/2015               Expected Discharge Plan:  [place]  In-House Referral:     Discharge planning Services  CM Consult  Post Acute Care Choice:  Home Health, Resumption of Svcs/PTA Provider Choice offered to:  Patient  DME Arranged:    DME Agency:     HH Arranged:  RN, PT, Nurse's Aide [place] Agency:  [facility]  Status of Service:  In process, will continue to follow  If discussed at Long Length of Stay Meetings, dates discussed:    Additional Comments:  Nathali Vent, Chauncey Reading, RN 08/18/2016, 1:11 PM

## 2016-08-18 NOTE — Progress Notes (Signed)
Pharmacy Antibiotic Note  Lindsay Salazar is a 66 y.o. female admitted on 08/15/2016 with pneumonia / bacteremia.  Pharmacy has been consulted for VANCOMYCIN dosing.  Trough level on target (49mcg/ml)  Plan: Continue Vancomycin 1000mg  IV q24hrs Check trough level weekly or sooner if warranted Cefepime 2gm IV q24hrs per MD Consider ID consult re: narrow ABX and LOT Monitor labs, renal fxn, progress, c/s  Height: 5\' 4"  (162.6 cm) Weight: 129 lb 6.6 oz (58.7 kg) IBW/kg (Calculated) : 54.7  Temp (24hrs), Avg:98.4 F (36.9 C), Min:98.2 F (36.8 C), Max:98.6 F (37 C)   Recent Labs Lab 08/12/16 0615 08/13/16 0748 08/15/16 1602 08/16/16 0536 08/17/16 1507 08/18/16 0610 08/18/16 0907  WBC  --   --  10.4 7.5 12.0*  --   --   CREATININE 2.03* 1.47* 1.09* 1.05*  --  1.06*  --   VANCOTROUGH  --   --   --   --   --   --  18    Estimated Creatinine Clearance: 43.9 mL/min (A) (by C-G formula based on SCr of 1.06 mg/dL (H)).    Allergies  Allergen Reactions  . Ibuprofen Nausea And Vomiting  . Other     Seafood   . Penicillins Swelling    Has patient had a PCN reaction causing immediate rash, facial/tongue/throat swelling, SOB or lightheadedness with hypotension: Yes Has patient had a PCN reaction causing severe rash involving mucus membranes or skin necrosis: No Has patient had a PCN reaction that required hospitalization No Has patient had a PCN reaction occurring within the last 10 years: No If all of the above answers are "NO", then may proceed with Cephalosporin use.   . Tiotropium Bromide Monohydrate Itching  . Tramadol Nausea And Vomiting   Anti-infectives    Start     Dose/Rate Route Frequency Ordered Stop   08/16/16 1000  vancomycin (VANCOCIN) IVPB 1000 mg/200 mL premix     1,000 mg 200 mL/hr over 60 Minutes Intravenous Every 24 hours 08/16/16 0942     08/15/16 2200  ceFEPIme (MAXIPIME) 2 g in dextrose 5 % 50 mL IVPB     2 g 100 mL/hr over 30 Minutes Intravenous Every 24  hours 08/15/16 1832 08/23/16 2159   08/15/16 1900  vancomycin (VANCOCIN) IVPB 1000 mg/200 mL premix     1,000 mg 200 mL/hr over 60 Minutes Intravenous  Once 08/15/16 1855 08/15/16 2153   08/15/16 1830  levofloxacin (LEVAQUIN) tablet 500 mg  Status:  Discontinued     500 mg Oral  Once 08/15/16 1817 08/15/16 1832     Enterococcus species NOT DETECTED NOT DETECTED   Listeria monocytogenes NOT DETECTED NOT DETECTED   Staphylococcus species NOT DETECTED DETECTED    Comment: Methicillin (oxacillin) resistant coagulase negative staphylococcus. Possible blood culture contaminant (unless isolated from more than one blood culture draw or clinical case suggests pathogenicity). No antibiotic treatment is indicated for blood  culture contaminants.  CRITICAL RESULT CALLED TO, READ BACK BY AND VERIFIED WITH:  Ivonne Andrew, RN 2100 08/16/2016 T. TYSOR   Staphylococcus aureus NOT DETECTED NOT DETECTED   Methicillin resistance NOT DETECTED DETECTED    Comment: CRITICAL RESULT CALLED TO, READ BACK BY AND VERIFIED WITH:  Ivonne Andrew, RN 2100 08/16/2016 T. TYSOR   Streptococcus species NOT DETECTED NOT DETECTED   Streptococcus agalactiae NOT DETECTED NOT DETECTED   Streptococcus pneumoniae NOT DETECTED NOT DETECTED   Streptococcus pyogenes NOT DETECTED NOT DETECTED   Acinetobacter baumannii NOT DETECTED NOT DETECTED  Enterobacteriaceae species NOT DETECTED NOT DETECTED   Enterobacter cloacae complex NOT DETECTED NOT DETECTED   Escherichia coli NOT DETECTED NOT DETECTED   Klebsiella oxytoca NOT DETECTED NOT DETECTED   Klebsiella pneumoniae NOT DETECTED NOT DETECTED   Proteus species NOT DETECTED NOT DETECTED   Serratia marcescens NOT DETECTED NOT DETECTED   Haemophilus influenzae NOT DETECTED NOT DETECTED   Neisseria meningitidis NOT DETECTED NOT DETECTED   Pseudomonas aeruginosa NOT DETECTED NOT DETECTED   Candida albicans NOT DETECTED NOT DETECTED   Candida glabrata NOT DETECTED NOT DETECTED     Candida krusei NOT DETECTED NOT DETECTED   Candida parapsilosis NOT DETECTED NOT DETECTED   Candida tropicalis NOT DETECTED NOT DETECTED    Thank you for allowing pharmacy to be a part of this patient's care.  [name], PharmD Clinical Pharmacist Pager:  [phone] 08/18/2016   [name] A 08/18/2016 10:21 AM

## 2016-08-18 NOTE — Progress Notes (Signed)
Pharmacy Antibiotic Note  Lindsay Salazar is a 66 y.o. female admitted on 08/15/2016 with pneumonia / bacteremia.  Pharmacy has been consulted for VANCOMYCIN dosing.  Trough level on target (10mcg/ml)  Plan: Continue Vancomycin 1000mg  IV q24hrs Check trough level weekly or sooner if warranted Monitor labs, renal fxn, progress, c/s  Height: 5\' 4"  (162.6 cm) Weight: 129 lb 6.6 oz (58.7 kg) IBW/kg (Calculated) : 54.7  Temp (24hrs), Avg:98.4 F (36.9 C), Min:98.2 F (36.8 C), Max:98.6 F (37 C)   Recent Labs Lab 08/12/16 0615 08/13/16 0748 08/15/16 1602 08/16/16 0536 08/17/16 1507 08/18/16 0610 08/18/16 0907  WBC  --   --  10.4 7.5 12.0*  --   --   CREATININE 2.03* 1.47* 1.09* 1.05*  --  1.06*  --   VANCOTROUGH  --   --   --   --   --   --  18    Estimated Creatinine Clearance: 43.9 mL/min (A) (by C-G formula based on SCr of 1.06 mg/dL (H)).    Allergies  Allergen Reactions  . Ibuprofen Nausea And Vomiting  . Other     Seafood   . Penicillins Swelling    Has patient had a PCN reaction causing immediate rash, facial/tongue/throat swelling, SOB or lightheadedness with hypotension: Yes Has patient had a PCN reaction causing severe rash involving mucus membranes or skin necrosis: No Has patient had a PCN reaction that required hospitalization No Has patient had a PCN reaction occurring within the last 10 years: No If all of the above answers are "NO", then may proceed with Cephalosporin use.   . Tiotropium Bromide Monohydrate Itching  . Tramadol Nausea And Vomiting   Anti-infectives    Start     Dose/Rate Route Frequency Ordered Stop   08/16/16 1000  vancomycin (VANCOCIN) IVPB 1000 mg/200 mL premix     1,000 mg 200 mL/hr over 60 Minutes Intravenous Every 24 hours 08/16/16 0942     08/15/16 2200  ceFEPIme (MAXIPIME) 2 g in dextrose 5 % 50 mL IVPB     2 g 100 mL/hr over 30 Minutes Intravenous Every 24 hours 08/15/16 1832 08/23/16 2159   08/15/16 1900  vancomycin (VANCOCIN)  IVPB 1000 mg/200 mL premix     1,000 mg 200 mL/hr over 60 Minutes Intravenous  Once 08/15/16 1855 08/15/16 2153   08/15/16 1830  levofloxacin (LEVAQUIN) tablet 500 mg  Status:  Discontinued     500 mg Oral  Once 08/15/16 1817 08/15/16 1832     Enterococcus species NOT DETECTED NOT DETECTED   Listeria monocytogenes NOT DETECTED NOT DETECTED   Staphylococcus species NOT DETECTED DETECTED    Comment: Methicillin (oxacillin) resistant coagulase negative staphylococcus. Possible blood culture contaminant (unless isolated from more than one blood culture draw or clinical case suggests pathogenicity). No antibiotic treatment is indicated for blood  culture contaminants.  CRITICAL RESULT CALLED TO, READ BACK BY AND VERIFIED WITH:  Ivonne Andrew, RN 2100 08/16/2016 T. TYSOR   Staphylococcus aureus NOT DETECTED NOT DETECTED   Methicillin resistance NOT DETECTED DETECTED    Comment: CRITICAL RESULT CALLED TO, READ BACK BY AND VERIFIED WITH:  Ivonne Andrew, RN 2100 08/16/2016 T. TYSOR   Streptococcus species NOT DETECTED NOT DETECTED   Streptococcus agalactiae NOT DETECTED NOT DETECTED   Streptococcus pneumoniae NOT DETECTED NOT DETECTED   Streptococcus pyogenes NOT DETECTED NOT DETECTED   Acinetobacter baumannii NOT DETECTED NOT DETECTED   Enterobacteriaceae species NOT DETECTED NOT DETECTED   Enterobacter cloacae complex NOT  DETECTED NOT DETECTED   Escherichia coli NOT DETECTED NOT DETECTED   Klebsiella oxytoca NOT DETECTED NOT DETECTED   Klebsiella pneumoniae NOT DETECTED NOT DETECTED   Proteus species NOT DETECTED NOT DETECTED   Serratia marcescens NOT DETECTED NOT DETECTED   Haemophilus influenzae NOT DETECTED NOT DETECTED   Neisseria meningitidis NOT DETECTED NOT DETECTED   Pseudomonas aeruginosa NOT DETECTED NOT DETECTED   Candida albicans NOT DETECTED NOT DETECTED   Candida glabrata NOT DETECTED NOT DETECTED   Candida krusei NOT DETECTED NOT DETECTED   Candida parapsilosis NOT DETECTED  NOT DETECTED   Candida tropicalis NOT DETECTED NOT DETECTED    Thank you for allowing pharmacy to be a part of this patient's care.  [name], PharmD Clinical Pharmacist Pager:  [phone] 08/18/2016   [name] A 08/18/2016 10:16 AM

## 2016-08-18 NOTE — Evaluation (Signed)
Physical Therapy Evaluation Patient Details Name: NEFERTARI REBMAN MRN: [id number] DOB: [birth year] Today's Date: 08/18/2016   History of Present Illness  MARIELIZ STRANG is a 66 y.o. female with medical history significant for pancreatic adenocarcinoma on chemotherapy, COPD with chronic hypoxic respiratory failure, chronic atrial fibrillation on Xarelto, anemia of chronic disease, chronic diastolic CHF, chronic kidney disease stage III, and depression with anxiety who presents to the emergency department for evaluation of productive cough, wheezing, and shortness of breath. Patient has had frequent ED visits and hospitalizations in recent months and was actually just discharged from the hospital yesterday after a 6 day admission for acute exacerbation and COPD. The patient had improved considerably and was felt to be stable for discharge. It was advised that she be discharged to an SNF and physical therapy had recommended this, however, the patient refused this and was discharged back home. She reports being unable to attend to her basic ADLs back at home due to a re-worsening in her dyspnea and a worsening cough that has been productive of thick creamy white sputum. She denies any fevers or chills and denies any chest pain or palpitations. She does note wheezing and reports no significant relief from her home inhalers. She called EMS and they found her saturating in the mid 80s on room air despite her known requirement for 2 L/m of supplemental oxygen around the clock. She was treated with albuterol neb en route.  Clinical Impression  Pt admitted with above diagnosis. Pt currently with functional limitations due to the deficits listed below (see "PT Problem List"). This patient is familiar to Pryor Curia from multiple recent prior admissions. Upon entry, the patient is received semirecumbent in bed, no family/caregiver present. The pt is awake and agreeable to participate, although reports severe pain and general  malaise. Pt in AF c RVR upon arrival, HR ranging 85-105bpm, increased 105-125 bpm at EOB, and 135-155bpm after single sit-to-stand transfer: attending made aware per verbal. Bed mobility performance is near baseline, except for cardiac limitations, however the patient requires near maximal effort to come to standing, while using BUE to pull self up from an elevated surface. amb trial is not appropriate at this time. SpO2 is stable on 2LPM, however no true aeorbic efforts are sustained during this visit. Pt is globally weak and deconditioned, far from her baseline, and unsafe to return to home. Pt will benefit from skilled PT intervention to increase independence and safety with basic mobility in preparation for discharge to the venue listed below.       Follow Up Recommendations SNF (pt strongly opposed until standing at bedside and feeling weak. )    Equipment Recommendations  None recommended by PT    Recommendations for Other Services       Precautions / Restrictions Precautions Precautions: Fall Precaution Comments: watch HR Restrictions Weight Bearing Restrictions: No      Mobility  Bed Mobility Overal bed mobility: Needs Assistance Bed Mobility: Supine to Sit     Supine to sit: HOB elevated;Supervision Sit to supine: HOB elevated;Supervision      Transfers Overall transfer level: Needs assistance Equipment used: 1 person hand held assist Transfers: Sit to/from Stand Sit to Stand: Modified independent (Device/Increase time);From elevated surface         General transfer comment: PT offered bilat hand hold for patient to pull self up, pt requires maximal effort to perform, and is then tachypnic, c RVR 135-155. Pt returned to supine and session ended, MD notified.  Ambulation/Gait Ambulation/Gait assistance:  (not safe at this time. )              Stairs            Wheelchair Mobility    Modified Rankin (Stroke Patients Only)       Balance                                              Pertinent Vitals/Pain Pain Assessment: 0-10 Pain Score: 9  Pain Location: bilar chest/upper back  Pain Descriptors / Indicators: Aching Pain Intervention(s): Limited activity within patient's tolerance;Monitored during session;Premedicated before session    Home Living Family/patient expects to be discharged to:: Private residence Living Arrangements: Children Available Help at Discharge: Available PRN/intermittently Type of Home: House Home Access: Stairs to enter Entrance Stairs-Rails: None Entrance Stairs-Number of Steps: 3 Home Layout: One level Home Equipment: Clinical cytogeneticist - 2 wheels;Bedside commode;Wheelchair - manual      Prior Function Level of Independence: Needs assistance   Gait / Transfers Assistance Needed: wc for community, uses SPC and RW in the house  ADL's / Homemaking Assistance Needed: has been able to make some effort with AD's        Hand Dominance   Dominant Hand: Right    Extremity/Trunk Assessment   Upper Extremity Assessment Upper Extremity Assessment: Generalized weakness    Lower Extremity Assessment Lower Extremity Assessment: Generalized weakness       Communication   Communication: No difficulties  Cognition Arousal/Alertness: Awake/alert Behavior During Therapy: WFL for tasks assessed/performed Overall Cognitive Status: Within Functional Limits for tasks assessed                                        General Comments      Exercises     Assessment/Plan    PT Assessment Patient needs continued PT services  PT Problem List Decreased strength;Decreased range of motion;Decreased activity tolerance;Decreased balance;Decreased mobility;Decreased coordination;Decreased knowledge of use of DME;Decreased safety awareness;Cardiopulmonary status limiting activity       PT Treatment Interventions DME instruction;Gait training;Stair training;Functional  mobility training;Balance training;Therapeutic exercise;Therapeutic activities;Neuromuscular re-education;Patient/family education    PT Goals (Current goals can be found in the Care Plan section)  Acute Rehab PT Goals Patient Stated Goal: regain strength to return to home  PT Goal Formulation: With patient Time For Goal Achievement: 09/01/16 Potential to Achieve Goals: Fair    Frequency Min 2X/week   Barriers to discharge Inaccessible home environment unable to amb at this time due to weakness    Co-evaluation               AM-PAC PT "6 Clicks" Daily Activity  Outcome Measure Difficulty turning over in bed (including adjusting bedclothes, sheets and blankets)?: A Lot Difficulty moving from lying on back to sitting on the side of the bed? : A Lot Difficulty sitting down on and standing up from a chair with arms (e.g., wheelchair, bedside commode, etc,.)?: Total Help needed moving to and from a bed to chair (including a wheelchair)?: Total Help needed walking in hospital room?: Total Help needed climbing 3-5 steps with a railing? : Total 6 Click Score: 8    End of Session Equipment Utilized During Treatment: Oxygen Activity Tolerance: Patient limited by fatigue;Treatment limited  secondary to medical complications (Comment) (AF c RVR ) Patient left: in bed;with call bell/phone within reach;with bed alarm set Nurse Communication: Mobility status (mobility sheet in room; notified MD of RVR with standing ) PT Visit Diagnosis: Muscle weakness (generalized) (M62.81);Other abnormalities of gait and mobility (R26.89)    Time: 1610-9604 PT Time Calculation (min) (ACUTE ONLY): 13 min   Charges:   PT Evaluation $PT Eval Moderate Complexity: 1 Procedure     PT G Codes:        12:26 PM, 09-07-16 Etta Grandchild, PT, DPT Physical Therapist - [place] [phone] (Office)   Jinny Sweetland C September 07, 2016, 12:21 PM

## 2016-08-18 NOTE — Progress Notes (Signed)
OT Screenn Note  Patient Details Name: CAMIL HAUSMANN MRN: [id number] DOB: Oct 12, 1950   Cancelled Treatment:    Reason Eval/Treat Not Completed: Other (comment). Patient is had 11 admission in the past 6 months. She was recently discharge from acute on 7/12 and returned on 7/13. During last admission, therapy recommended SNF at discharge and patient refused. Spoke with patient regarding non-compliance with therapy recommendation and she continues to adamantly refuse SNF at discharge stating that she'll get better on her own. Educated patient on the increased likelihood of returning to the hospital if she discharges home. Patient was unable to be persuaded to change her mind.   OT continues to recommend SNF at discharge as patient is unable to return home safely.     Ailene Ravel, OTR/L,CBIS  [phone]  08/18/2016, 9:03 AM

## 2016-08-19 LAB — GLUCOSE, CAPILLARY
GLUCOSE-CAPILLARY: 236 mg/dL — AB (ref 65–99)
GLUCOSE-CAPILLARY: 243 mg/dL — AB (ref 65–99)
Glucose-Capillary: 156 mg/dL — ABNORMAL HIGH (ref 65–99)
Glucose-Capillary: 208 mg/dL — ABNORMAL HIGH (ref 65–99)

## 2016-08-19 LAB — BASIC METABOLIC PANEL
Anion gap: 12 (ref 5–15)
BUN: 40 mg/dL — AB (ref 6–20)
CHLORIDE: 99 mmol/L — AB (ref 101–111)
CO2: 23 mmol/L (ref 22–32)
CREATININE: 1 mg/dL (ref 0.44–1.00)
Calcium: 8.3 mg/dL — ABNORMAL LOW (ref 8.9–10.3)
GFR calc Af Amer: >60 mL/min (ref >60)
GFR calc non Af Amer: 57 mL/min — ABNORMAL LOW (ref >60)
GLUCOSE: 240 mg/dL — AB (ref 65–99)
POTASSIUM: 4.6 mmol/L (ref 3.5–5.1)
Sodium: 134 mmol/L — ABNORMAL LOW (ref 135–145)

## 2016-08-19 LAB — CULTURE, BLOOD (ROUTINE X 2): SPECIAL REQUESTS: ADEQUATE

## 2016-08-19 LAB — CBC
HEMATOCRIT: 29.2 % — AB (ref 36.0–46.0)
Hemoglobin: 9.4 g/dL — ABNORMAL LOW (ref 12.0–15.0)
MCH: 29.7 pg (ref 26.0–34.0)
MCHC: 32.2 g/dL (ref 30.0–36.0)
MCV: 92.1 fL (ref 78.0–100.0)
Platelets: 211 10*3/uL (ref 150–400)
RBC: 3.17 MIL/uL — ABNORMAL LOW (ref 3.87–5.11)
RDW: 14.9 % (ref 11.5–15.5)
WBC: 21.1 10*3/uL — ABNORMAL HIGH (ref 4.0–10.5)

## 2016-08-19 LAB — EXPECTORATED SPUTUM ASSESSMENT W REFEX TO RESP CULTURE

## 2016-08-19 LAB — EXPECTORATED SPUTUM ASSESSMENT W GRAM STAIN, RFLX TO RESP C

## 2016-08-19 NOTE — Progress Notes (Signed)
Progress Note    Lindsay Salazar  TKW:[id number] DOB: January 26, 1951  DOA: 08/15/2016 PCP: [name], PA-C    Brief Narrative:   Chief complaint: Follow-up pneumonia  Medical records reviewed and are as summarized below:  Lindsay Salazar is an 66 y.o. female with a PMH of pancreatic adenocarcinoma on chemotherapy, COPD/chronic respiratory failure with hypoxia, chronic atrial fibrillation on Xarelto, anemia of chronic disease, chronic diastolic CHF, stage III CKD and depression/anxiety who was admitted 08/15/16 with worsening respiratory status. On admission, she was found to have atrial fibrillation with RVR. Chest x-ray showed increased bibasilar reticular densities concerning for edema versus pneumonia. BNP was 625.  Assessment/Plan:   Principal Problem:   COPD with acute exacerbation (HCC)/Healthcare associated pneumonia resulting in acute on chronic hypoxic respiratory failure Patient was placed on empiric vancomycin and cefepime to cover hospital-acquired pathogens. Vancomycin discontinued 08/18/16 after MRSA nasal screen was found to be negative. Blood cultures are growing Escherichia coli and Staph hominis in one of 2 bottles, suspect contamination. Repeat blood cultures sent and are negative to date. Steroids initiated on admission, weaned to oral prednisone 08/17/16. Continue bronchodilators and mucolytics.  Active Problems:   Atrial fibrillation with RVR ([place]): Stable CHADS-VASc at least 3 (age, gender, CHF). Heart rate improved from the 130s to the 80-100 on diltiazem and Lopressor. Continue Xarelto.    Pancreatic cancer [facility]): Stable Last treatment was on 05/15/2016 with Gemcitabine/Abraxane. Plan is to undergo restaging MRI of the abdomen and referred back to Dr. Barry Dienes for consideration of distal pancreatectomy.    Anemia of chronic disease: Stable Baseline hemoglobin 9-11 mg/dL. Hemoglobin stable.    Essential hypertension: Stable Blood pressure controlled on Cardizem, Lasix  and when necessary metoprolol.    CKD (chronic kidney disease) stage 3, GFR 30-59 ml/min: Stable Baseline creatinine 1.05. Current creatinine consistent with usual baseline values.    Depression/anxiety: Stable Continue Lexapro and when necessary Ativan.    Hyperglycemia: Improved Likely steroid-induced. Hemoglobin A1c 5.7% 06/08/16. Continue SSI. CBGs 156-195.   Family Communication/Anticipated D/C date and plan/Code Status   DVT prophylaxis: Xarelto ordered. Code Status: Full Code.  Family Communication: No family present. Disposition Plan: Says she lives with a boyfriend.  Refuses SNF.   Medical Consultants:    None.   Anti-Infectives:    Vancomycin 08/15/16--->08/18/16  Cefepime 08/15/16--->  Subjective:   [name] says she continues to have shortness of breath and chest congestion.  Cough currently not productive. Bowels moved today. No nausea or vomiting.  Appetite good. Feels very weak.  Objective:    Vitals:   08/18/16 2206 08/19/16 0514 08/19/16 0827 08/19/16 0832  BP: 102/84 95/67    Pulse: 80 100    Resp: 20 15    Temp: 97.8 F (36.6 C) (!) 97 F (36.1 C)    TempSrc: Oral Oral    SpO2: 99% 99% 95% 95%  Weight:  59.6 kg (131 lb 6.4 oz)    Height:        Intake/Output Summary (Last 24 hours) at 08/19/16 0844 Last data filed at 08/19/16 0300  Gross per 24 hour  Intake              730 ml  Output              200 ml  Net              530 ml   Filed Weights   08/16/16 0500 08/18/16 0426 08/19/16 9242  Weight: 54.1 kg (119 lb 6 oz) 58.7 kg (129 lb 6.6 oz) 59.6 kg (131 lb 6.4 oz)    Exam: General exam: Unchanged: Chronically ill appearing female in no acute distress. Respiratory system: Unchanged: Lungs are diminished with scattered rhonchi. Occasional wheeze. Cardiovascular system: Unchanged: Heart sounds are regular with a normal rate. No murmurs, rubs, or gallops. Gastrointestinal system: Unchanged: Abdomen is soft, nontender, nondistended  with normal active bowel sounds. Central nervous system: Unchanged: Alert, nonfocal. Extremities: Trace edema, no cyanosis. Skin: Unchanged: No rashes. Scattered ecchymosis to the upper extremities. Hirsute. Port-A-Cath right chest wall, not currently accessed. Psychiatry: Unchanged: Mood and affect appropriate. Poor insight and judgment.  Data Reviewed:   I have personally reviewed following labs and imaging studies:  Labs: Labs show the following:  MRSA PCR screen was negative. GI pathogen panel negative.   Culture data:  Urine culture 08/11/16:50,000 colonies of Escherichia coli. Blood culture 08/15/16: No growth 4 days (right arm). Blood culture 08/15/16: Staphylococcus hominis and Escherichia coli  (left antecubital). Blood culture 08/17/16: No growth x 2 days (right arm) Blood cultre 08/17/16: No growth x 2 days (right hand)   Procedures and diagnostic studies:  Chest x-ray 08/15/16: Increased bibasilar reticular densities right, greater than left. Pneumonia versus edema.   Medications:   . diltiazem  240 mg Oral Daily  . docusate sodium  100 mg Oral BID  . escitalopram  10 mg Oral Daily  . feeding supplement (ENSURE ENLIVE)  237 mL Oral BID BM  . furosemide  40 mg Oral Daily  . guaiFENesin  1,200 mg Oral BID  . insulin aspart  0-15 Units Subcutaneous TID WC  . insulin aspart  0-5 Units Subcutaneous QHS  . ipratropium-albuterol  3 mL Nebulization TID  . metoprolol tartrate  50 mg Oral BID  . mometasone-formoterol  2 puff Inhalation BID  . pantoprazole  40 mg Oral Daily  . potassium chloride SA  20 mEq Oral Daily  . predniSONE  40 mg Oral Daily  . Rivaroxaban  15 mg Oral Q supper  . sodium chloride flush  3 mL Intravenous Q12H  . temazepam  15 mg Oral QHS   Continuous Infusions: . sodium chloride    . ceFEPime (MAXIPIME) IV Stopped (08/18/16 2141)    Medical decision making is of mod complexity and this patient is at mod risk of deterioration, therefore this is a  level 2 visit.  (> 4 problem points, 1 data point, mod risk: Need 2 out of 3)   Problems/DDx Points   Self limited or minor (max 2)       1   Established problem, stable       1  9  Established problem, worsening       2   New problem, no additional W/U planned (max 1) hyperglycemia       3    New problem, additional W/U planned        4    Data Reviewed Points   Review/order clinical lab tests       1  1  Review/order x-rays       1   Review/order tests (Echo, EKG, PFTs, etc)       1   Discussion of test results w/ performing MD       1   Independent review of image, tracing or specimen       2    Decision to obtain old records  1   Review and summation of old records       2     Level of risk Presenting prob Diagnostics Management   Minimal 1 self limited/minor Labs CXR EKG/EEG U/A U/S Rest Gargles Bandages Dressings   Low 2 or more self limited/minor 1 stable chronic Acute uncomplicated illness Tests (PFTS) Non-CV imaging Arterial labs Biopsies of skin OTC drugs Minor surgery-no risk PT OT IVF without additives    Moderate 1 or more chronic illnesses w/ mild exac, progression or S/E from tx 2 or more stable chronic illnesses Undiagnosed new problem w/ uncertain prognosis Acute complicated injury  Stress tests Endoscopies with no risk factors Deep needle or incisional bx CV imaging without risk LP Thoracentesis Paracentesis Minor surgery w/ risks Elective major surgery w/ no risk (open, percutaneous or endoscopic) Prescription drugs Therapeutic nucl med IVF with additives Closed tx of fracture/dislocation    High Severe exac of chronic illness Acute or chronic illness/injury may pose a threat to life or bodily function (ARF) Change in neuro status    CV imaging w/ contrast and risk Cardio electophysiologic tests Endoscopies w/ risk Discography Elective major surgery Emergency major surgery Parenteral controlled substances Drug therapy req  monitoring for toxicity DNR/de-escalation of care    MDM Prob points Data points Risk   Straightforward    <1    <1    Min   Low complexity    2    2    Low   Moderate    3    3    Mod   High Complexity    4 or more    4 or more    High      LOS: 4 days   Orey Moure  [facility] Pager [phone]. If unable to reach me by pager, please call my cell phone at [phone].  *Please refer to [email or web address], password [email or web address] to get updated schedule on who will round on this patient, as hospitalists switch teams weekly. If 7PM-7AM, please contact night-coverage at [email or web address], password [email or web address] for any overnight needs.  08/19/2016, 8:44 AM

## 2016-08-20 ENCOUNTER — Encounter (HOSPITAL_COMMUNITY): Payer: Self-pay | Admitting: Primary Care

## 2016-08-20 ENCOUNTER — Ambulatory Visit (HOSPITAL_COMMUNITY): Payer: Medicare HMO

## 2016-08-20 DIAGNOSIS — Z7189 Other specified counseling: Secondary | ICD-10-CM

## 2016-08-20 DIAGNOSIS — Z87891 Personal history of nicotine dependence: Secondary | ICD-10-CM

## 2016-08-20 DIAGNOSIS — Z515 Encounter for palliative care: Secondary | ICD-10-CM

## 2016-08-20 LAB — GLUCOSE, CAPILLARY
GLUCOSE-CAPILLARY: 217 mg/dL — AB (ref 65–99)
Glucose-Capillary: 163 mg/dL — ABNORMAL HIGH (ref 65–99)
Glucose-Capillary: 182 mg/dL — ABNORMAL HIGH (ref 65–99)
Glucose-Capillary: 131 mg/dL — ABNORMAL HIGH (ref 65–99)

## 2016-08-20 LAB — CULTURE, BLOOD (ROUTINE X 2): CULTURE: NO GROWTH

## 2016-08-20 MED ORDER — LEVALBUTEROL HCL 0.63 MG/3ML IN NEBU
0.6300 mg | INHALATION_SOLUTION | Freq: Four times a day (QID) | RESPIRATORY_TRACT | Status: DC
  Administered 2016-08-20 – 2016-08-22 (×7): 0.63 mg via RESPIRATORY_TRACT
  Filled 2016-08-20 (×7): qty 3

## 2016-08-20 MED ORDER — METOPROLOL TARTRATE 5 MG/5ML IV SOLN
2.5000 mg | INTRAVENOUS | Status: DC | PRN
  Administered 2016-08-20 – 2016-08-21 (×2): 2.5 mg via INTRAVENOUS
  Filled 2016-08-20 (×3): qty 5

## 2016-08-20 MED ORDER — IPRATROPIUM BROMIDE 0.02 % IN SOLN: 0.5000 mg | Freq: Four times a day (QID) | RESPIRATORY_TRACT | Status: DC

## 2016-08-20 MED ORDER — INSULIN ASPART 100 UNIT/ML ~~LOC~~ SOLN
0.0000 [IU] | Freq: Three times a day (TID) | SUBCUTANEOUS | Status: DC
  Administered 2016-08-20: 3 [IU] via SUBCUTANEOUS
  Administered 2016-08-20: 2 [IU] via SUBCUTANEOUS
  Administered 2016-08-20: 3 [IU] via SUBCUTANEOUS
  Administered 2016-08-21: 4 [IU] via SUBCUTANEOUS
  Administered 2016-08-21: 2 [IU] via SUBCUTANEOUS
  Administered 2016-08-21: 3 [IU] via SUBCUTANEOUS
  Administered 2016-08-22: 5 [IU] via SUBCUTANEOUS

## 2016-08-20 MED ORDER — INSULIN ASPART 100 UNIT/ML ~~LOC~~ SOLN
4.0000 [IU] | Freq: Three times a day (TID) | SUBCUTANEOUS | Status: DC
  Administered 2016-08-20 – 2016-08-22 (×8): 4 [IU] via SUBCUTANEOUS

## 2016-08-20 MED ORDER — INSULIN ASPART 100 UNIT/ML ~~LOC~~ SOLN
0.0000 [IU] | Freq: Every day | SUBCUTANEOUS | Status: DC
  Administered 2016-08-20 – 2016-08-21 (×2): 2 [IU] via SUBCUTANEOUS

## 2016-08-20 MED ORDER — IPRATROPIUM BROMIDE 0.02 % IN SOLN
0.5000 mg | Freq: Four times a day (QID) | RESPIRATORY_TRACT | Status: DC
  Administered 2016-08-20 – 2016-08-22 (×7): 0.5 mg via RESPIRATORY_TRACT
  Filled 2016-08-20 (×7): qty 2.5

## 2016-08-20 MED ORDER — RIVAROXABAN 15 MG PO TABS
15.0000 mg | ORAL_TABLET | Freq: Every day | ORAL | Status: DC
  Administered 2016-08-20 – 2016-08-21 (×2): 15 mg via ORAL
  Filled 2016-08-20 (×2): qty 1

## 2016-08-20 NOTE — Consult Note (Signed)
Telepsych Consultation   Reason for Consult:  Medical decision making ability Referring Physician:  EDP Patient Identification: Lindsay Salazar MRN:  [id number] Principal Diagnosis: COPD with acute exacerbation [facility]) Diagnosis:   Patient Active Problem List   Diagnosis Date Noted  . Hypoxia [R09.02]   . AKI (acute kidney injury) ([place]) [N17.9]   . 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 Impaired glucose tolerance [R73.02] 08/10/2016  . Acute renal failure superimposed on stage 3 chronic kidney disease ([place]) [N17.9, N18.3] 08/10/2016  . Obstructive chronic bronchitis with exacerbation ([place]) [J44.1]   . Impaired glucose tolerance test [R73.02]   . Acute chest pain [R07.9]   . Low vitamin D level [E55.9] 08/07/2016  . CKD (chronic kidney disease) stage 3, GFR 30-59 ml/min [N18.3] 07/21/2016  . Malnutrition of moderate degree [E44.0] 06/04/2016  . Cellulitis of left foot [L03.116] 05/26/2016  . Acute exacerbation of chronic obstructive pulmonary disease (COPD) ([place]) [J44.1] 05/09/2016  . Cellulitis [L03.90] 04/26/2016  . Chronic respiratory failure with hypoxia ([place]) [J96.11] 04/26/2016  . Hypoxemia [R09.02] 03/30/2016  . Acute on chronic respiratory failure with hypoxia ([place]) [J96.21] 03/29/2016  . Leukocytosis [D72.829] 03/29/2016  . Anemia of chronic disease [D63.8] 03/29/2016  . Essential hypertension [I10] 03/29/2016  . HCAP (healthcare-associated pneumonia) [J18.9] 03/28/2016  . B12 deficiency [E53.8] 01/24/2016  . COPD with acute exacerbation ([place]) [J44.1] 01/20/2016  . Pancreatic cancer ([place]) [C25.9] 12/07/2015  . Chronic anticoagulation-Xarelto [Z79.01] 09/06/2015  . Gastrointestinal hemorrhage with melena [K92.1] 08/08/2015  . Atrial fibrillation ([place]) [I48.91] 07/17/2015  . Atrial fibrillation with RVR ([place]) [I48.91] 06/27/2015    Total Time spent with patient: 30  minutes  Subjective:   Lindsay Salazar is a 66 y.o. female patient admitted with COPD exacerbation/Healthcare associated Pneumonia resulting acute on chronic respiratory failure.  HPI: Lindsay Salazar is a 66 year old female who was admitted to the medical floor at [facility] for a reocurrance of COPD exacerbation. Pt was just discharged on 08/14/2016 for the same issue. Pt seen and chart reviewed. Pt is alert, oriented x4, calm, cooperative, dressed in a hospital gown and reclining on the hospital bed. Pt was wearing oxygen via nasal canula and appeared to be having shortness of breath and was agitated by this writer asking her so many questions. Pt was oriented to season, president, date of birth and  Reason for hospital admission. Pt's thought processes were coherent and linear, her thought content was logical and her affect was congruent. Pt's mood was anxious, her eye contact was good and her judgement and insight were good.  Pt's significant other, Lindsay Salazar, was present during the interview. Pt is her own guardian and is able to make decisions regarding her own healthcare. Pt stated she is able to take care of her own needs at home when she is well and sometimes Lindsay Salazar does help her.  Lindsay Salazar confirmed that the Pt makes decisions for herself and tends to her own affairs. Pt stated she will not go to a SNF due to the fact that the staff there does not have time to properly care for everyone. Pt stated she can do for herself at home and has Lindsay Salazar there to help her if she needs it. Pt stated she was sent home from the hospital before she was well enough to go and that is why she is back. Pt stated she refuses medications that she does not feel are  good for her, such as tramadol. Pt is willing to have [place] and physical therapy come to her house to help her.   Discussed pt with treatment team who recommend that there is no indication that Pt is unable to make decisions for herself and that Pt has the right to  refuse treatment which includes medication and care at a skilled nursing facility. Pt is psychiatrically cleared.   Past Psychiatric History: Anxiety, Depression  Risk to Self: Is patient at risk for suicide?: No Risk to Others:   Prior Inpatient Therapy:   Prior Outpatient Therapy:    Past Medical History:  Past Medical History:  Diagnosis Date  . Anemia of chronic disease   . Arthritis    bursitis , fibromyalgia  . Arthritis    "right hip" (11/27/2015)  . Asthma   . Chronic bronchitis ([place])   . Chronic respiratory failure ([place])   . Chronic right hip pain   . COPD (chronic obstructive pulmonary disease) ([place])    wears home o2 prn  . Daily headache    "last 2-3 months" (11/27/2015)  . DVT (deep venous thrombosis) ([place]) 03/2016  . Exposure to TB    "mom had it when she was pregnant with me"  . Fibromyalgia   . GERD (gastroesophageal reflux disease)    use to have it but not now  . Heart murmur    71-  64 years old  . Hyperkalemia   . Hypertension   . On home oxygen therapy    11/27/2015 "whenever I need it; don't know how many liters"  . PAF (paroxysmal atrial fibrillation) ([place])   . Pancreatic cancer ([place])   . Paroxysmal atrial flutter ([place])   . Pneumonia    "several times" (11/27/2015)  . Pneumothorax     Past Surgical History:  Procedure Laterality Date  . ANTERIOR CERVICAL DECOMP/DISCECTOMY FUSION     "put 4 screws in"  . CARDIAC CATHETERIZATION  08/2004   Archie Endo 06/18/2010  . CARDIOVERSION N/A 07/18/2015   Procedure: CARDIOVERSION;  Surgeon: Josue Hector, MD;  Location: [facility] ENDO SUITE;  Service: Cardiovascular;  Laterality: N/A;  . ESOPHAGOGASTRODUODENOSCOPY N/A 08/09/2015   Procedure: ESOPHAGOGASTRODUODENOSCOPY (EGD);  Surgeon: Rogene Houston, MD;  Location: [facility] ENDO SUITE;  Service: Endoscopy;  Laterality: N/A;  . EUS N/A 11/29/2015   Procedure: UPPER ENDOSCOPIC ULTRASOUND (EUS) RADIAL;  Surgeon: Milus Banister, MD;  Location: [facility] ENDOSCOPY;  Service:  Endoscopy;  Laterality: N/A;  . IR GENERIC HISTORICAL  12/18/2015   IR US GUIDE VASC ACCESS RIGHT 12/18/2015 Sandi Mariscal, MD [facility]-INTERV RAD  . IR GENERIC HISTORICAL  12/18/2015   IR FLUORO GUIDE PORT INSERTION RIGHT 12/18/2015 Sandi Mariscal, MD [facility]-INTER[facility]  . TONSILLECTOMY    . TUBAL LIGATION     Family History:  Family History  Problem Relation Age of Onset  . COPD Mother   . Diabetes Father   . Stroke Father    Family Psychiatric  History: Unknown Social History:  History  Alcohol Use No    Comment: quit 30 years ago     History  Drug Use No    Social History   Social History  . Marital status: Divorced    Spouse name: N/A  . Number of children: N/A  . Years of education: N/A   Occupational History  . retired    Social History Main Topics  . Smoking status: Former Smoker    Packs/day: 0.50    Years:  46.00    Types: Cigarettes    Start date: 10/09/1969    Quit date: 06/02/2015  . Smokeless tobacco: Never Used  . Alcohol use No     Comment: quit 30 years ago  . Drug use: No  . Sexual activity: Not Currently    Birth control/ protection: Surgical, Post-menopausal   Other Topics Concern  . None   Social History Narrative  . None    Musculoskeletal: Unable to assess: camera Psychiatric Specialty Exam: Physical Exam  Review of Systems  Psychiatric/Behavioral: Positive for depression. Negative for hallucinations, memory loss, substance abuse and suicidal ideas. The patient is nervous/anxious. The patient does not have insomnia.   All other systems reviewed and are negative.   Blood pressure 120/75, pulse 87, temperature 97.8 F (36.6 C), temperature source Oral, resp. rate 20, height 5\' 4"  (1.626 m), weight 60.5 kg (133 lb 4.8 oz), SpO2 98 %.Body mass index is 22.88 kg/m.  General Appearance: Casual  Eye Contact:  Good  Speech:  Clear and Coherent and Normal Rate  Volume:  Normal  Mood:  Anxious and Irritable  Affect:  Congruent  Thought Process:   Coherent and Linear  Orientation:  Full (Time, Place, and Person)  Thought Content:  Logical  Suicidal Thoughts:  No  Homicidal Thoughts:  No  Memory:  Immediate;   Good Recent;   Good Remote;   Good  Judgement:  Good  Insight:  Good  Psychomotor Activity:  Normal  Concentration:  Concentration: Good and Attention Span: Good  Recall:  Good  Fund of Knowledge:  Good  Language:  Good  Akathisia:  No  Handed:  Right  AIMS (if indicated):     Assets:  Communication Skills Desire for Improvement Financial Resources/Insurance Housing Resilience Social Support Transportation  ADL's:  Intact  Cognition:  WNL  Sleep:        Treatment Plan Summary: Plan Pt is psychiatrically cleared  There is no indication Pt is unable to make her own medical decisions.  Pt is psychiatrically cleared.  Pt is willing to have [facility] for nursing and Physical Therapy upon discharge from the hospital.  Disposition: No evidence of imminent risk to self or others at present.   Supportive therapy provided about ongoing stressors.  Ethelene Hal, NP 08/20/2016 12:13 PM

## 2016-08-20 NOTE — Progress Notes (Signed)
Progress Note    Lindsay Salazar  HUT:[id number] DOB: 1951/02/03  DOA: 08/15/2016 PCP: [name], PA-C    Brief Narrative:   Chief complaint: Follow-up pneumonia  Medical records reviewed and are as summarized below:  Lindsay Salazar is an 66 y.o. female with a PMH of pancreatic adenocarcinoma on chemotherapy, COPD/chronic respiratory failure with hypoxia, chronic atrial fibrillation on Xarelto, anemia of chronic disease, chronic diastolic CHF, stage III CKD and depression/anxiety who was admitted 08/15/16 with worsening respiratory status. On admission, she was found to have atrial fibrillation with RVR. Chest x-ray showed increased bibasilar reticular densities concerning for edema versus pneumonia. BNP was 625.  Assessment/Plan:   Principal Problem:   COPD with acute exacerbation (HCC)/Healthcare associated pneumonia resulting in acute on chronic hypoxic respiratory failure: Ongoing Patient was placed on empiric vancomycin and cefepime to cover hospital-acquired pathogens. Vancomycin discontinued 08/18/16 after MRSA nasal screen was found to be negative. Blood cultures are growing Escherichia coli and Staph hominis in one of 2 bottles, suspect contamination. Repeat blood cultures sent and are negative to date. Steroids initiated on admission, weaned to oral prednisone 08/17/16. Continue Cefepime, bronchodilators and mucolytics. Lungs continue to sound course with rhochi.  Active Problems:   Atrial fibrillation with RVR ([place]): Stable CHADS-VASc at least 3 (age, gender, CHF). Heart rate improved from the 130s to the 80-106 on diltiazem and Lopressor. Continue Xarelto.    Pancreatic cancer [facility]): Stable Last treatment was on 05/15/2016 with Gemcitabine/Abraxane. Plan is to undergo restaging MRI of the abdomen and referred back to Dr. Barry Dienes for consideration of distal pancreatectomy.    Anemia of chronic disease: Stable Baseline hemoglobin 9-11 mg/dL. Hemoglobin stable.    Essential  hypertension: Stable Blood pressure controlled on Cardizem, Lasix and when necessary metoprolol.    CKD (chronic kidney disease) stage 3, GFR 30-59 ml/min: Stable Baseline creatinine 1.05. Current creatinine consistent with usual baseline values.    Depression/anxiety: Stable Continue Lexapro and when necessary Ativan. ? Competency. She has had 11 hospitalizations in the past 6 months, is unable to care for herself and refuses placement. Will ask psychiatrist to evaluate.    Hyperglycemia: Improved Likely steroid-induced. Hemoglobin A1c 5.7% 06/08/16. Continue SSI. CBGs [id number]. Add 4 units of meal coverage.   Family Communication/Anticipated D/C date and plan/Code Status   DVT prophylaxis: Xarelto ordered. Code Status: Full Code.  Family Communication: No family present. Disposition Plan: Says she lives with a boyfriend.  Refuses SNF.   Medical Consultants:    Psychiatry   Anti-Infectives:    Vancomycin 08/15/16--->08/18/16  Cefepime 08/15/16--->  Subjective:   Lindsay Salazar continues to feel weak and short of breath.  Has a congested cough, mostly non-productive. No new complaints.  Objective:    Vitals:   08/19/16 2110 08/20/16 0110 08/20/16 0510 08/20/16 0730  BP: (!) 127/95 124/84 120/75   Pulse: 90 (!) 106 87   Resp: 20 20 20    Temp: 98 F (36.7 C) 97.9 F (36.6 C) 97.8 F (36.6 C)   TempSrc: Oral Oral Oral   SpO2: 100% 97% 99% 98%  Weight:   60.5 kg (133 lb 4.8 oz)   Height:        Intake/Output Summary (Last 24 hours) at 08/20/16 0733 Last data filed at 08/19/16 1923  Gross per 24 hour  Intake              720 ml  Output  600 ml  Net              120 ml   Filed Weights   08/18/16 0426 08/19/16 0514 08/20/16 0510  Weight: 58.7 kg (129 lb 6.6 oz) 59.6 kg (131 lb 6.4 oz) 60.5 kg (133 lb 4.8 oz)    Exam: General exam: Unchanged 08/20/16: Chronically ill appearing female in no acute distress. Respiratory system: Unchanged 08/20/16: Lungs are  diminished with scattered rhonchi. Occasional wheeze. Cardiovascular system: Unchanged 08/20/16: Heart sounds are regular with a normal rate. No murmurs, rubs, or gallops. Gastrointestinal system: Unchanged 08/20/16: Abdomen is soft, nontender, nondistended with normal active bowel sounds. Central nervous system: Unchanged 08/20/16: Alert, nonfocal. Extremities: Unchanged 08/20/16:Trace edema, no cyanosis. Skin: Unchanged 08/20/16: No rashes. Scattered ecchymosis to the upper extremities. Hirsute. Port-A-Cath right chest wall, not currently accessed. Psychiatry: Unchanged 08/20/16:: Mood and affect appropriate. Poor insight and judgment.  Data Reviewed:   I have personally reviewed following labs and imaging studies:  Labs: Labs show the following:  MRSA PCR screen was negative. GI pathogen panel negative.   Culture data:  Urine culture 08/11/16:50,000 colonies of Escherichia coli. Blood culture 08/15/16: No growth 4 days (right arm). Blood culture 08/15/16: Staphylococcus hominis and Escherichia coli  (left antecubital). Blood culture 08/17/16: No growth x 3 days (right arm) Blood cultre 08/17/16: No growth x 3 days (right hand) Sputum culture 08/19/16: Abundant WBCs seen on the Gram stain. Culture pending.   Procedures and diagnostic studies:  Chest x-ray 08/15/16: Increased bibasilar reticular densities right, greater than left. Pneumonia versus edema.   Medications:   . diltiazem  240 mg Oral Daily  . docusate sodium  100 mg Oral BID  . escitalopram  10 mg Oral Daily  . feeding supplement (ENSURE ENLIVE)  237 mL Oral BID BM  . furosemide  40 mg Oral Daily  . guaiFENesin  1,200 mg Oral BID  . insulin aspart  0-15 Units Subcutaneous TID WC  . insulin aspart  0-5 Units Subcutaneous QHS  . ipratropium-albuterol  3 mL Nebulization TID  . metoprolol tartrate  50 mg Oral BID  . mometasone-formoterol  2 puff Inhalation BID  . pantoprazole  40 mg Oral Daily  . potassium chloride SA  20 mEq  Oral Daily  . predniSONE  40 mg Oral Daily  . rivaroxaban  20 mg Oral Daily  . sodium chloride flush  3 mL Intravenous Q12H  . temazepam  15 mg Oral QHS   Continuous Infusions: . sodium chloride    . ceFEPime (MAXIPIME) IV Stopped (08/19/16 2201)    Medical decision making is of mod complexity and this patient is at mod risk of deterioration, therefore this is a level 2 visit.  (> 4 problem points, 1 data point, mod risk: Need 2 out of 3)   Problems/DDx Points   Self limited or minor (max 2)       1   Established problem, stable       1   4+  Established problem, worsening       2   New problem, no additional W/U planned (max 1) hyperglycemia       3    New problem, additional W/U planned        4    Data Reviewed Points   Review/order clinical lab tests       1  1  Review/order x-rays       1   Review/order tests (Echo, EKG, PFTs, etc)  1   Discussion of test results w/ performing MD       1   Independent review of image, tracing or specimen       2    Decision to obtain old records       1   Review and summation of old records       2     Level of risk Presenting prob Diagnostics Management   Minimal 1 self limited/minor Labs CXR EKG/EEG U/A U/S Rest Gargles Bandages Dressings   Low 2 or more self limited/minor 1 stable chronic Acute uncomplicated illness Tests (PFTS) Non-CV imaging Arterial labs Biopsies of skin OTC drugs Minor surgery-no risk PT OT IVF without additives    Moderate 1 or more chronic illnesses w/ mild exac, progression or S/E from tx 2 or more stable chronic illnesses Undiagnosed new problem w/ uncertain prognosis Acute complicated injury  Stress tests Endoscopies with no risk factors Deep needle or incisional bx CV imaging without risk LP Thoracentesis Paracentesis Minor surgery w/ risks Elective major surgery w/ no risk (open, percutaneous or endoscopic) Prescription drugs Therapeutic nucl med IVF with additives Closed tx of  fracture/dislocation    High Severe exac of chronic illness Acute or chronic illness/injury may pose a threat to life or bodily function (ARF) Change in neuro status    CV imaging w/ contrast and risk Cardio electophysiologic tests Endoscopies w/ risk Discography Elective major surgery Emergency major surgery Parenteral controlled substances Drug therapy req monitoring for toxicity DNR/de-escalation of care    MDM Prob points Data points Risk   Straightforward    <1    <1    Min   Low complexity    2    2    Low   Moderate    3    3    Mod   High Complexity    4 or more    4 or more    High      LOS: 5 days   Lindsay Salazar  [facility] Pager [phone]. If unable to reach me by pager, please call my cell phone at [phone].  *Please refer to [email or web address], password [email or web address] to get updated schedule on who will round on this patient, as hospitalists switch teams weekly. If 7PM-7AM, please contact night-coverage at [email or web address], password [email or web address] for any overnight needs.  08/20/2016, 7:33 AM

## 2016-08-20 NOTE — Consult Note (Signed)
Consultation Note Date: 08/20/2016   Patient Name: Lindsay Salazar  DOB: Feb 26, 1950  MRN: [id number]  Age / Sex: 66 y.o., female  PCP: Lindsay Fairy, PA-C Referring Physician: Rama, Venetia Maxon, MD  Reason for Consultation: Establishing goals of care and Psychosocial/spiritual support  HPI/Patient Profile: 66 y.o. female  with past medical history of Pancreatic adenocarcinoma currently taking chemotherapy, COPD with chronic hypoxic respiratory failure, chronic a fib on Xarelto, anemia of chronic disease, chronic diastolic heart failure, chronic kidney disease stage III, depression and anxiety admitted on 08/15/2016 with COPD exacerbation and HCAP.   Clinical Assessment and Goals of Care: Lindsay Salazar is sitting up in her [facility] chair in her room. She greets me making and keeping eye contact as I enter. There is no family at bedside at this time. We talk about Lindsay Salazar chronic and acute health problems. We talk about her home life. [name] states that she has lived with her boyfriend, Lindsay Salazar for 30 years. She states that he is disabled with the diabetes at age 23. She states that he was able to get her back walking after a recent hospitalization. Lindsay Salazar also states that she has spent time in rehab at [facility]. She states she feels better today, her neighbors have been visiting. We talk about her home life, she states she no longer drives, her license was taken due to cataracts that she has not had removed. Lindsay Salazar shares that Lindsay Salazar gets groceries and cooks foods for them.  We talk about symptom management. Lindsay Salazar states that she frequently has pain. We talk about breathlessness and the use of morphine. Mr. and states that she has no one to prescribe narcotic medications for her. She states that she sees PCP Baucom with [facility] when she can.   Healthcare power of  attorney LEGAL GUARDIAN - Lindsay Salazar is listed as having a legal guardian. She states that she would like for her daughter, Lindsay Salazar to meet her health care decision maker. I did not ask her today the name of her legal guardian.   SUMMARY OF RECOMMENDATIONS   at this point, continue to treat the treatable. Continue with chemotherapy every other week as tolerated. Continue code status discussions.  Code Status/Advance Care Planning:  Full code - we do not discuss code status today. Working on relationship building.  Symptom Management:   per hospitalist, no additional needs at this time.  Palliative Prophylaxis:   Frequent Pain Assessment and Turn Reposition  Additional Recommendations (Limitations, Scope, Preferences):  Full Scope Treatment  Psycho-social/Spiritual:   Desire for further Chaplaincy support:no  Additional Recommendations: Caregiving  Support/Resources and Education on Hospice  Prognosis:   Unable to determine, based on outcomes. 6 months or less would not be surprising based on burden of pancreatic adenocarcinoma, CHF, COPD exacerbations with 11 hospitalizations in the last 6 months.  Discharge Planning: To Be Determined      Primary Diagnoses: Present on Admission: . Acute on chronic respiratory failure with hypoxia (  HCC) . Anemia of chronic disease . CKD (chronic kidney disease) stage 3, GFR 30-59 ml/min . Atrial fibrillation with RVR ([place]) . Pancreatic cancer ([place]) . COPD with acute exacerbation ([place]) . Essential hypertension . HCAP (healthcare-associated pneumonia)   I have reviewed the medical record, interviewed the patient and family, and examined the patient. The following aspects are pertinent.  Past Medical History:  Diagnosis Date  . Anemia of chronic disease   . Arthritis    bursitis , fibromyalgia  . Arthritis    "right hip" (11/27/2015)  . Asthma   . Chronic bronchitis ([place])   . Chronic respiratory failure ([place])   .  Chronic right hip pain   . COPD (chronic obstructive pulmonary disease) ([place])    wears home o2 prn  . Daily headache    "last 2-3 months" (11/27/2015)  . DVT (deep venous thrombosis) ([place]) 03/2016  . Exposure to TB    "mom had it when she was pregnant with me"  . Fibromyalgia   . GERD (gastroesophageal reflux disease)    use to have it but not now  . Heart murmur    65-  34 years old  . Hyperkalemia   . Hypertension   . On home oxygen therapy    11/27/2015 "whenever I need it; don't know how many liters"  . PAF (paroxysmal atrial fibrillation) ([place])   . Pancreatic cancer ([place])   . Paroxysmal atrial flutter ([place])   . Pneumonia    "several times" (11/27/2015)  . Pneumothorax    Social History   Social History  . Marital status: Divorced    Spouse name: N/A  . Number of children: N/A  . Years of education: N/A   Occupational History  . retired    Social History Main Topics  . Smoking status: Former Smoker    Packs/day: 0.50    Years: 46.00    Types: Cigarettes    Start date: 10/09/1969    Quit date: 06/02/2015  . Smokeless tobacco: Never Used  . Alcohol use No     Comment: quit 30 years ago  . Drug use: No  . Sexual activity: Not Currently    Birth control/ protection: Surgical, Post-menopausal   Other Topics Concern  . None   Social History Narrative  . None   Family History  Problem Relation Age of Onset  . COPD Mother   . Diabetes Father   . Stroke Father    Scheduled Meds: . diltiazem  240 mg Oral Daily  . docusate sodium  100 mg Oral BID  . escitalopram  10 mg Oral Daily  . feeding supplement (ENSURE ENLIVE)  237 mL Oral BID BM  . furosemide  40 mg Oral Daily  . guaiFENesin  1,200 mg Oral BID  . insulin aspart  0-15 Units Subcutaneous TID WC  . insulin aspart  0-5 Units Subcutaneous QHS  . insulin aspart  4 Units Subcutaneous TID WC  . ipratropium  0.5 mg Nebulization QID  . levalbuterol  0.63 mg Nebulization Q6H  . metoprolol tartrate  50 mg Oral  BID  . mometasone-formoterol  2 puff Inhalation BID  . pantoprazole  40 mg Oral Daily  . potassium chloride SA  20 mEq Oral Daily  . predniSONE  40 mg Oral Daily  . Rivaroxaban  15 mg Oral Q supper  . sodium chloride flush  3 mL Intravenous Q12H  . temazepam  15 mg Oral QHS   Continuous Infusions: . sodium chloride    .  ceFEPime (MAXIPIME) IV Stopped (08/19/16 2201)   PRN Meds:.sodium chloride, HYDROcodone-acetaminophen, LORazepam, metoprolol tartrate, sodium chloride flush Medications Prior to Admission:  Prior to Admission medications   Medication Sig Start Date End Date Taking? Authorizing Provider  COMBIVENT RESPIMAT 20-100 MCG/ACT AERS respimat Inhale 1 puff into the lungs every 6 (six) hours as needed for wheezing. 08/14/16  Yes Emokpae, Ejiroghene E, MD  diltiazem (CARDIZEM CD) 240 MG 24 hr capsule Take 1 capsule (240 mg total) by mouth daily. 05/23/16 08/21/16 Yes Lendon Colonel, NP  docusate sodium (COLACE) 100 MG capsule Take 1 capsule (100 mg total) by mouth 2 (two) times daily. 07/22/16  Yes Isaac Bliss, Rayford Halsted, MD  ergocalciferol (VITAMIN D2) 50000 units capsule Take 1 capsule (50,000 Units total) by mouth once a week. 08/07/16  Yes Kefalas, Manon Hilding, PA-C  escitalopram (LEXAPRO) 10 MG tablet Take 1 tablet by mouth daily. 08/04/16  Yes [provider]  furosemide (LASIX) 40 MG tablet Take 1 tablet (40 mg total) by mouth daily. 08/16/16  Yes Emokpae, Ejiroghene E, MD  guaiFENesin (MUCINEX) 600 MG 12 hr tablet Take 2 tablets (1,200 mg total) by mouth 2 (two) times daily. 08/14/16  Yes Emokpae, Ejiroghene E, MD  HYDROcodone-acetaminophen (NORCO/VICODIN) 5-325 MG tablet Take 1 tablet by mouth every 4 (four) hours as needed. 07/24/16  Yes Nat Christen, MD  ipratropium-albuterol (DUONEB) 0.5-2.5 (3) MG/3ML SOLN Inhale 3 mLs into the lungs every 6 (six) hours as needed (for shortness of breath).  03/03/16  Yes [provider]  LORazepam (ATIVAN) 0.5 MG tablet Take 1  tablet (0.5 mg total) by mouth every 6 (six) hours as needed for anxiety. 08/14/16  Yes Emokpae, Ejiroghene E, MD  metoprolol tartrate (LOPRESSOR) 50 MG tablet Take 1 tablet (50 mg total) by mouth 2 (two) times daily. 03/02/16  Yes Kathie Dike, MD  OXYGEN Inhale 2 L into the lungs daily as needed (for breathing).   Yes [provider]  pantoprazole (PROTONIX) 40 MG tablet Take 1 tablet (40 mg total) by mouth daily. 12/03/15  Yes Rai, Ripudeep K, MD  potassium chloride SA (K-DUR,KLOR-CON) 20 MEQ tablet Take 1 tablet (20 mEq total) by mouth daily. 08/16/16  Yes Emokpae, Ejiroghene E, MD  Rivaroxaban (XARELTO) 15 MG TABS tablet Take 1 tablet (15 mg total) by mouth daily with supper. 05/01/16  Yes Tat, Shanon Brow, MD  SYMBICORT 160-4.5 MCG/ACT inhaler Inhale 1 puff into the lungs 2 (two) times daily. 12/03/15  Yes Rai, Ripudeep K, MD  temazepam (RESTORIL) 15 MG capsule Take 15 mg by mouth at bedtime.   Yes [provider]   Allergies  Allergen Reactions  . Ibuprofen Nausea And Vomiting  . Other     Seafood   . Penicillins Swelling    Has patient had a PCN reaction causing immediate rash, facial/tongue/throat swelling, SOB or lightheadedness with hypotension: Yes Has patient had a PCN reaction causing severe rash involving mucus membranes or skin necrosis: No Has patient had a PCN reaction that required hospitalization No Has patient had a PCN reaction occurring within the last 10 years: No If all of the above answers are "NO", then may proceed with Cephalosporin use.   . Tiotropium Bromide Monohydrate Itching  . Tramadol Nausea And Vomiting   Review of Systems  Unable to perform ROS: Age    Physical Exam  Constitutional: She is oriented to person, place, and time. No distress.  Appears chronically ill, calm and cooperative, makes and keeps eye contact  HENT:  Head: Atraumatic.  Temporal wasting  Cardiovascular: Normal rate.   Pulmonary/Chest: Effort normal. No respiratory  distress.  Abdominal: Soft. She exhibits distension.  Musculoskeletal: She exhibits no edema.  Muscle wasting  Neurological: She is alert and oriented to person, place, and time.  Skin: Skin is warm and dry.  Bilateral fore arms purple with bruising  Nursing note and vitals reviewed.   Vital Signs: BP 123/65 (BP Location: Left Arm)   Pulse (!) 153 Comment: AF c RVR per tele 120s-150s, peaking after single STS transfer, recovery requires 6 minutes sitting.   Temp 98 F (36.7 C) (Oral)   Resp 20   Ht 5\' 4"  (1.626 m)   Wt 60.5 kg (133 lb 4.8 oz)   SpO2 95%   BMI 22.88 kg/m  Pain Assessment: 0-10   Pain Score: 10-Worst pain ever   SpO2: SpO2: 95 % O2 Device:SpO2: 95 % O2 Flow Rate: .O2 Flow Rate (L/min): 2 L/min  IO: Intake/output summary:  Intake/Output Summary (Last 24 hours) at 08/20/16 1557 Last data filed at 08/20/16 1230  Gross per 24 hour  Intake              600 ml  Output             1800 ml  Net            -1200 ml    LBM: Last BM Date: 08/19/16 Baseline Weight: Weight: 56.4 kg (124 lb 4.8 oz) Most recent weight: Weight: 60.5 kg (133 lb 4.8 oz)     Palliative Assessment/Data:   Flowsheet Rows     Most Recent Value  Intake Tab  Referral Department  Oncology  Unit at Time of Referral  Cardiac/Telemetry Unit  Palliative Care Primary Diagnosis  Pulmonary  Date Notified  08/19/16  Palliative Care Type  New Palliative care  Reason for referral  Clarify Goals of Care  Date of Admission  08/15/16  Date first seen by Palliative Care  08/20/16  # of days Palliative referral response time  1 Day(s)  # of days IP prior to Palliative referral  4  Clinical Assessment  Palliative Performance Scale Score  30%  Pain Max last 24 hours  Not able to report  Pain Min Last 24 hours  Not able to report  Dyspnea Max Last 24 Hours  Not able to report  Dyspnea Min Last 24 hours  Not able to report  Psychosocial & Spiritual Assessment  Palliative Care Outcomes    Patient/Family meeting held?  Yes  Who was at the meeting?  Patient only today  Palliative Care Outcomes  Clarified goals of care, Provided psychosocial or spiritual support, Provided advance care planning      Time In: 1450 Time Out: 1525 Time Total: 35 minutes Greater than 50%  of this time was spent counseling and coordinating care related to the above assessment and plan.  Signed by: Drue Novel, NP   Please contact Palliative Medicine Team phone at [phone] for questions and concerns.  For individual provider: See Shea Evans

## 2016-08-20 NOTE — Progress Notes (Signed)
Physical Therapy Treatment Patient Details Name: Lindsay Salazar MRN: [id number] DOB: [birth year] Today's Date: 08/20/2016    History of Present Illness Lindsay Salazar is a 66 y.o. female with medical history significant for pancreatic adenocarcinoma on chemotherapy, COPD with chronic hypoxic respiratory failure, chronic atrial fibrillation on Xarelto, anemia of chronic disease, chronic diastolic CHF, chronic kidney disease stage III, and depression with anxiety who presents to the emergency department for evaluation of productive cough, wheezing, and shortness of breath. Patient has had frequent ED visits and hospitalizations in recent months and was actually just discharged from the hospital yesterday after a 6 day admission for acute exacerbation and COPD. The patient had improved considerably and was felt to be stable for discharge. It was advised that she be discharged to an SNF and physical therapy had recommended this, however, the patient refused this and was discharged back home. She reports being unable to attend to her basic ADLs back at home due to a re-worsening in her dyspnea and a worsening cough that has been productive of thick creamy white sputum. She denies any fevers or chills and denies any chest pain or palpitations. She does note wheezing and reports no significant relief from her home inhalers. She called EMS and they found her saturating in the mid 80s on room air despite her known requirement for 2 L/m of supplemental oxygen around the clock. She was treated with albuterol neb en route.    PT Comments    Pt agreeable to participate, although she acknowledges continued presence of chest congestion concerning to her ability to participate. The patient continues to require maximal effort and minA physical assist for coming to a full stance, unable to obtain balance independently due to weakness in BLE weakness. Upon standing, pt noted palpitations in chest, with tele indicating irregular  rhythm alternating rates between 120s-150s, recovery to resting rate requires >5minutes seated. Repeated bouts are attempted with PT providing max-total assist to decrease RPE for patient, but HR is only controlled for the first 3 reps, the 4th with rate returning to 130s-140s.  PT reviewed seated exercises for legs, encouraged to perform indep while up to chair, encouraged to cease should palpitations return. Progress remains limited, in part to continued severe weakness, as well as, continued poor cardiac tolerance to exertional activity. Pt is not safe to mobilize without heavy physical assistance, unclear if this is available in home, as pt is refusing STR placement recommendations. If patient returns to home, a hospital bed, hydraulic left, and WC are most appropriate for safe mobility, however it is not clear that there is adequate space for these in the home. Pt currently sleeps on the couch, likely for recumbence in the setting of chronic lung disease.      Follow Up Recommendations  SNF (pt still refusing to go to STR. Acknowledges her severe weakness. )     Equipment Recommendations  If patient returns to home, a hospital bed, hydraulic left, and WC are most appropriate for safe mobility, however it is not clear that there is adequate space for these in the home. Pt currently sleeps on the couch, likely for recumbence in the setting of chronic lung disease.     Recommendations for Other Services       Precautions / Restrictions Precautions Precautions: Fall Precaution Comments: watch HR Restrictions Weight Bearing Restrictions: No    Mobility  Bed Mobility Overal bed mobility: Needs Assistance Bed Mobility: Supine to Sit     Supine to sit:  HOB elevated;Modified independent (Device/Increase time)     General bed mobility comments: additional time required   Transfers Overall transfer level: Needs assistance Equipment used: 1 person hand held assist Transfers: Sit to/from  Stand Sit to Stand: From elevated surface;Min assist         General transfer comment: very weak, with minA lift assist, pt requrinf near maximal effort to come to standing with BUE pulling self to standing, pt verbally noting chest palpitations and tele revealing of RVR 120s-150s.   Ambulation/Gait   Ambulation Distance (Feet): 3 Feet Assistive device: 1 person hand held assist       General Gait Details: very weak, requires modA, legs buckling, and somewhat ataxic    Stairs            Wheelchair Mobility    Modified Rankin (Stroke Patients Only)       Balance   Sitting-balance support: Single extremity supported;Feet supported Sitting balance-Leahy Scale: Good     Standing balance support: Bilateral upper extremity supported;During functional activity Standing balance-Leahy Scale: Zero               High level balance activites: Turns              Cognition Arousal/Alertness: Awake/alert Behavior During Therapy: WFL for tasks assessed/performed Overall Cognitive Status: Within Functional Limits for tasks assessed                                        Exercises Other Exercises Other Exercises: Repeated STS transfers (4x) from chair, PT providing Max-totalA and 60sec revcory between efforts to maintain controlled HR in AF<130s. Eventually, HR returns to 130s-140s.     General Comments        Pertinent Vitals/Pain Pain Assessment: No/denies pain Pain Intervention(s): Limited activity within patient's tolerance;Monitored during session    Home Living                      Prior Function            PT Goals (current goals can now be found in the care plan section) Acute Rehab PT Goals Patient Stated Goal: regain strength to return to home  PT Goal Formulation: With patient Time For Goal Achievement: 09/01/16 Potential to Achieve Goals: Poor Progress towards PT goals: PT to reassess next treatment (pain has  decreased, HR remains poorly tolerant to exertion)    Frequency    Min 2X/week      PT Plan Current plan remains appropriate    Co-evaluation              AM-PAC PT "6 Clicks" Daily Activity  Outcome Measure  Difficulty turning over in bed (including adjusting bedclothes, sheets and blankets)?: A Lot Difficulty moving from lying on back to sitting on the side of the bed? : A Lot Difficulty sitting down on and standing up from a chair with arms (e.g., wheelchair, bedside commode, etc,.)?: Total Help needed moving to and from a bed to chair (including a wheelchair)?: Total Help needed walking in hospital room?: Total Help needed climbing 3-5 steps with a railing? : Total 6 Click Score: 8    End of Session Equipment Utilized During Treatment: Oxygen Activity Tolerance: Treatment limited secondary to medical complications (Comment);Patient limited by fatigue Patient left: with call bell/phone within reach;in chair Nurse Communication: Mobility status (AF c RVR, needs new  battery for tele, needs cath replaced. ) PT Visit Diagnosis: Muscle weakness (generalized) (M62.81);Other abnormalities of gait and mobility (R26.89)     Time: 2767-0110 PT Time Calculation (min) (ACUTE ONLY): 30 min  Charges:  $Therapeutic Activity: 8-22 mins                    G Codes:       2:37 PM, August 23, 2016 Etta Grandchild, PT, DPT Physical Therapist - [place] [phone] (Office)    Slayton Lubitz C 23-Aug-2016, 2:25 PM

## 2016-08-21 ENCOUNTER — Inpatient Hospital Stay (HOSPITAL_COMMUNITY): Payer: Medicare HMO

## 2016-08-21 LAB — GLUCOSE, CAPILLARY
GLUCOSE-CAPILLARY: 184 mg/dL — AB (ref 65–99)
GLUCOSE-CAPILLARY: 174 mg/dL — AB (ref 65–99)
Glucose-Capillary: 128 mg/dL — ABNORMAL HIGH (ref 65–99)
Glucose-Capillary: 160 mg/dL — ABNORMAL HIGH (ref 65–99)
Glucose-Capillary: 218 mg/dL — ABNORMAL HIGH (ref 65–99)
Glucose-Capillary: 214 mg/dL — ABNORMAL HIGH (ref 65–99)

## 2016-08-21 MED ORDER — METOPROLOL TARTRATE 5 MG/5ML IV SOLN
2.5000 mg | Freq: Once | INTRAVENOUS | Status: AC
  Administered 2016-08-21: 2.5 mg via INTRAVENOUS

## 2016-08-21 NOTE — Progress Notes (Signed)
Pt has chronic afib. HR has been in 120's tonight, as high as 150. 2.5mg  IV Lopressor administered with no change in HR, BP 111/89. Paged Dr. Hal Hope and received order for extra dose of 2.5 mg IV Lopressor and to give scheduled dose of PO Lopressor ordered for 2200 tonight. Orders carried out, HR came down to upper 90's to one teens.

## 2016-08-21 NOTE — Progress Notes (Addendum)
Progress Note    KEYONA EMRICH  UDJ:[id number] DOB: Jun 09, 1950  DOA: 08/15/2016 PCP: [name], PA-C    Brief Narrative:   Chief complaint: Follow-up pneumonia  Medical records reviewed and are as summarized below:  BRANDILYN NANNINGA is an 66 y.o. female with a PMH of pancreatic adenocarcinoma on chemotherapy, COPD/chronic respiratory failure with hypoxia, chronic atrial fibrillation on Xarelto, anemia of chronic disease, chronic diastolic CHF, stage III CKD and depression/anxiety who was admitted 08/15/16 with worsening respiratory status. On admission, she was found to have atrial fibrillation with RVR. Chest x-ray showed increased bibasilar reticular densities concerning for edema versus pneumonia. BNP was 625.  Assessment/Plan:   Principal Problem:   COPD with acute exacerbation (HCC)/Healthcare associated pneumonia resulting in acute on chronic hypoxic respiratory failure: Ongoing Patient was placed on empiric vancomycin and cefepime to cover hospital-acquired pathogens. Vancomycin discontinued 08/18/16 after MRSA nasal screen was found to be negative. Blood cultures are growing Escherichia coli and Staph hominis in one of 2 bottles, suspect contamination. Repeat blood cultures sent and remain negative to date. Steroids initiated on admission, weaned to oral prednisone 08/17/16. Continue Cefepime, bronchodilators and mucolytics. Lungs continue to sound course with rhochi--No change. Was evaluated by the palliative care team on 08/20/16 and continues to want full scope of treatment. Repeat chest x-ray, and if showing signs of improvement, we'll narrow antibiotics and switch to oral therapy.  Active Problems:   Atrial fibrillation with RVR ([place]): Stable CHADS-VASc at least 3 (age, gender, CHF). Heart rate improved from the 130s to the 57-76 on diltiazem and Lopressor. Continue Xarelto.    Pancreatic cancer [facility]): Stable Last treatment was on 05/15/2016 with Gemcitabine/Abraxane. Plan is to  undergo restaging MRI of the abdomen and referred back to Dr. Barry Dienes for consideration of distal pancreatectomy. Full scope of treatment desired per palliative care evaluation.    Anemia of chronic disease: Stable Baseline hemoglobin 9-11 mg/dL. Hemoglobin stable.    Essential hypertension: Stable Blood pressure controlled on Cardizem, Lasix and when necessary metoprolol.    CKD (chronic kidney disease) stage 3, GFR 30-59 ml/min: Stable Baseline creatinine 1.05. Current creatinine consistent with usual baseline values.    Depression/anxiety: Stable Continue Lexapro and when necessary Ativan. ? Competency. She has had 11 hospitalizations in the past 6 months, is unable to care for herself and refuses placement. Will ask psychiatrist to evaluate.    Hyperglycemia: Improved Likely steroid-induced. Hemoglobin A1c 5.7% 06/08/16. Continue SSI. CBGs [id number]. Add 4 units of meal coverage.   Family Communication/Anticipated D/C date and plan/Code Status   DVT prophylaxis: Xarelto ordered. Code Status: Full Code.  Family Communication: No family present. Disposition Plan: Says she lives with a boyfriend and his brother.  Refuses SNF.   Medical Consultants:    Psychiatry  Palliative Care   Anti-Infectives:    Vancomycin 08/15/16--->08/18/16  Cefepime 08/15/16--->  Subjective:   Mrs. Yackel continues to feel weak and short of breath, but is beginning to feel a little bit better.  Continues to have congested cough, mostly non-productive. No new complaints. Main symptom continues to be weakness.  Objective:    Vitals:   08/20/16 2130 08/21/16 0202 08/21/16 0437 08/21/16 0752  BP: 137/88  128/64   Pulse: 76  (!) 57   Resp: 18  18   Temp: (!) 97.4 F (36.3 C)  99 F (37.2 C)   TempSrc: Oral  Oral   SpO2: 97% 98% 100% 98%  Weight:  Height:        Intake/Output Summary (Last 24 hours) at 08/21/16 0755 Last data filed at 08/21/16 0400  Gross per 24 hour  Intake               410 ml  Output             1300 ml  Net             -890 ml   Filed Weights   08/18/16 0426 08/19/16 0514 08/20/16 0510  Weight: 58.7 kg (129 lb 6.6 oz) 59.6 kg (131 lb 6.4 oz) 60.5 kg (133 lb 4.8 oz)    Exam: General exam: Unchanged 08/21/16: Chronically ill appearing female in no acute distress. Respiratory system: Unchanged 08/21/16: Lungs are diminished with scattered rhonchi.  Cardiovascular system: Unchanged 08/21/16: Heart sounds are regular with a normal rate. No murmurs, rubs, or gallops. Gastrointestinal system: Unchanged 08/21/16: Abdomen is soft, nontender, nondistended with normal active bowel sounds. Central nervous system: Unchanged 08/21/16: Alert, nonfocal. Extremities: Unchanged 08/21/16:Trace edema, no cyanosis. Skin: Unchanged 08/21/16: No rashes. Scattered ecchymosis to the upper extremities. Hirsute. Port-A-Cath right chest wall, not currently accessed. Psychiatry: Unchanged 08/21/16: Mood and affect appropriate. Poor insight and judgment.  Data Reviewed:   I have personally reviewed following labs and imaging studies:  Labs: Labs show the following:  MRSA PCR screen was negative. GI pathogen panel negative.   Culture data:  Urine culture 08/11/16:50,000 colonies of Escherichia coli. Blood culture 08/15/16: No growth 4 days (right arm). Blood culture 08/15/16: Staphylococcus hominis and Escherichia coli  (left antecubital). Blood culture 08/17/16: No growth x 3 days (right arm) Blood cultre 08/17/16: No growth x 3 days (right hand) Sputum culture 08/19/16: Abundant WBCs seen on the Gram stain. Culture reincubated.   Procedures and diagnostic studies:  Chest x-ray 08/15/16: Increased bibasilar reticular densities right, greater than left. Pneumonia versus edema.     Medications:   . diltiazem  240 mg Oral Daily  . docusate sodium  100 mg Oral BID  . escitalopram  10 mg Oral Daily  . feeding supplement (ENSURE ENLIVE)  237 mL Oral BID BM  . furosemide  40 mg  Oral Daily  . guaiFENesin  1,200 mg Oral BID  . insulin aspart  0-15 Units Subcutaneous TID WC  . insulin aspart  0-5 Units Subcutaneous QHS  . insulin aspart  4 Units Subcutaneous TID WC  . ipratropium  0.5 mg Nebulization Q6H  . levalbuterol  0.63 mg Nebulization Q6H  . metoprolol tartrate  50 mg Oral BID  . mometasone-formoterol  2 puff Inhalation BID  . pantoprazole  40 mg Oral Daily  . potassium chloride SA  20 mEq Oral Daily  . predniSONE  40 mg Oral Daily  . Rivaroxaban  15 mg Oral Q supper  . sodium chloride flush  3 mL Intravenous Q12H  . temazepam  15 mg Oral QHS   Continuous Infusions: . sodium chloride    . ceFEPime (MAXIPIME) IV 2 g (08/20/16 2130)    Medical decision making is of mod complexity and this patient is at mod risk of deterioration, therefore this is a level 2 visit.  (> 4 problem points, 1 data point, mod risk: Need 2 out of 3)   Problems/DDx Points   Self limited or minor (max 2)       1   Established problem, stable       1   4+  Established problem, worsening  2   New problem, no additional W/U planned (max 1) hyperglycemia       3    New problem, additional W/U planned        4    Data Reviewed Points   Review/order clinical lab tests       1  1  Review/order x-rays       1   Review/order tests (Echo, EKG, PFTs, etc)       1   Discussion of test results w/ performing MD       1   Independent review of image, tracing or specimen       2    Decision to obtain old records       1   Review and summation of old records       2     Level of risk Presenting prob Diagnostics Management   Minimal 1 self limited/minor Labs CXR EKG/EEG U/A U/S Rest Gargles Bandages Dressings   Low 2 or more self limited/minor 1 stable chronic Acute uncomplicated illness Tests (PFTS) Non-CV imaging Arterial labs Biopsies of skin OTC drugs Minor surgery-no risk PT OT IVF without additives    Moderate 1 or more chronic illnesses w/ mild exac, progression  or S/E from tx 2 or more stable chronic illnesses Undiagnosed new problem w/ uncertain prognosis Acute complicated injury  Stress tests Endoscopies with no risk factors Deep needle or incisional bx CV imaging without risk LP Thoracentesis Paracentesis Minor surgery w/ risks Elective major surgery w/ no risk (open, percutaneous or endoscopic) Prescription drugs Therapeutic nucl med IVF with additives Closed tx of fracture/dislocation    High Severe exac of chronic illness Acute or chronic illness/injury may pose a threat to life or bodily function (ARF) Change in neuro status    CV imaging w/ contrast and risk Cardio electophysiologic tests Endoscopies w/ risk Discography Elective major surgery Emergency major surgery Parenteral controlled substances Drug therapy req monitoring for toxicity DNR/de-escalation of care    MDM Prob points Data points Risk   Straightforward    <1    <1    Min   Low complexity    2    2    Low   Moderate    3    3    Mod   High Complexity    4 or more    4 or more    High      LOS: 6 days   Almeda Ezra  [facility] Pager [phone]. If unable to reach me by pager, please call my cell phone at [phone].  *Please refer to [email or web address], password [email or web address] to get updated schedule on who will round on this patient, as hospitalists switch teams weekly. If 7PM-7AM, please contact night-coverage at [email or web address], password [email or web address] for any overnight needs.  08/21/2016, 7:55 AM

## 2016-08-21 NOTE — Progress Notes (Signed)
Daily Progress Note   Patient Name: Lindsay Salazar [facility]       Date: 08/21/2016 DOB: Nov 12, 1950  Age: 66 y.o. MRN#: [id number] Attending Physician: Tonye Royalty, MD Primary Care Physician: Vesta Mixer Admit Date: 08/15/2016  Reason for Consultation/Follow-up: Establishing goals of care and Psychosocial/spiritual support  Subjective: Lindsay Salazar is resting quietly in bed watching TV. She greets me, making and keeping eye contact as I enter. She has no complaints at this time but is her usual self. She shares that she has seen the doctor already today and has been told that she will transition from IV tube by mouth antibiotics.   We talk about her physical conditioning, and I ask if she has seen physical therapy yet today. Lindsay Salazar shares her concerns about being pushed to walk farther and faster than she feels she can. She shares that in her home, she exercises in the evenings after her boyfriend goes to bed so she can take her time.  I share that people do not stay in the hospital until they are 100% healed, and that I expect that she will again be discharged before she feels she is 100%.  We talk about the benefits of rehab for a week or two. Historically Lindsay Salazar has declined rehab. I share the rehab will be an extra set of eyes, 24/7 skilled care to monitor her along with rehab. I acknowledge with her that no one really ever wants to go to rehab, but it can be very helpful. She states that her boyfriend just of 30 years, Lindsay Salazar does a good job in caring for her. She states that when she has needs he is there quickly, but that she will consider her options.   I share my concern over her 11 hospitalizations in the last 6 months. Lindsay Salazar states that people don't realize it, but she is  keeping up with how many times she has been in the hospital. I agai share "that worries me, 11 times", she states that she is also worried. Lunch tray arrives, and we agreed to meet again tomorrow to continue talking.  Length of Stay: 6  Current Medications: Scheduled Meds:  . diltiazem  240 mg Oral Daily  . docusate sodium  100 mg Oral BID  . escitalopram  10 mg Oral  Daily  . feeding supplement (ENSURE ENLIVE)  237 mL Oral BID BM  . furosemide  40 mg Oral Daily  . guaiFENesin  1,200 mg Oral BID  . insulin aspart  0-15 Units Subcutaneous TID WC  . insulin aspart  0-5 Units Subcutaneous QHS  . insulin aspart  4 Units Subcutaneous TID WC  . ipratropium  0.5 mg Nebulization Q6H  . levalbuterol  0.63 mg Nebulization Q6H  . metoprolol tartrate  50 mg Oral BID  . mometasone-formoterol  2 puff Inhalation BID  . pantoprazole  40 mg Oral Daily  . potassium chloride SA  20 mEq Oral Daily  . predniSONE  40 mg Oral Daily  . Rivaroxaban  15 mg Oral Q supper  . sodium chloride flush  3 mL Intravenous Q12H  . temazepam  15 mg Oral QHS    Continuous Infusions: . sodium chloride    . ceFEPime (MAXIPIME) IV 2 g (08/20/16 2130)    PRN Meds: sodium chloride, HYDROcodone-acetaminophen, LORazepam, metoprolol tartrate, sodium chloride flush  Physical Exam  Constitutional: No distress.  Makes and sometimes keeps eye contact, calm and cooperative. Chronically ill appearing  HENT:  Head: Atraumatic.  Cardiovascular: Normal rate.   Pulmonary/Chest: Effort normal. No respiratory distress.  Abdominal: Soft. She exhibits no distension.  Musculoskeletal: She exhibits no edema.  Muscle weakness  Neurological: She is alert.  Skin: Skin is warm and dry.  Purple bruising to bilateral forearms  Nursing note and vitals reviewed.           Vital Signs: BP 128/64   Pulse (!) 57   Temp 99 F (37.2 C) (Oral)   Resp 18   Ht 5\' 4"  (1.626 m)   Wt 61.8 kg (136 lb 3.2 oz)   SpO2 100%   BMI 23.38 kg/m    SpO2: SpO2: 100 % O2 Device: O2 Device:  (nebulizer) O2 Flow Rate: O2 Flow Rate (L/min): 2 L/min  Intake/output summary:  Intake/Output Summary (Last 24 hours) at 08/21/16 1249 Last data filed at 08/21/16 0400  Gross per 24 hour  Intake               50 ml  Output              100 ml  Net              -50 ml   LBM: Last BM Date: 08/20/16 Baseline Weight: Weight: 56.4 kg (124 lb 4.8 oz) Most recent weight: Weight: 61.8 kg (136 lb 3.2 oz)       Palliative Assessment/Data:    Flowsheet Rows     Most Recent Value  Intake Tab  Referral Department  Oncology  Unit at Time of Referral  Cardiac/Telemetry Unit  Palliative Care Primary Diagnosis  Pulmonary  Date Notified  08/19/16  Palliative Care Type  New Palliative care  Reason for referral  Clarify Goals of Care  Date of Admission  08/15/16  Date first seen by Palliative Care  08/20/16  # of days Palliative referral response time  1 Day(s)  # of days IP prior to Palliative referral  4  Clinical Assessment  Palliative Performance Scale Score  30%  Pain Max last 24 hours  Not able to report  Pain Min Last 24 hours  Not able to report  Dyspnea Max Last 24 Hours  Not able to report  Dyspnea Min Last 24 hours  Not able to report  Psychosocial & Spiritual Assessment  Palliative Care Outcomes  Patient/Family meeting held?  Yes  Who was at the meeting?  Patient only today  Palliative Care Outcomes  Clarified goals of care, Provided psychosocial or spiritual support, Provided advance care planning      Patient Active Problem List   Diagnosis Date Noted  . Palliative care encounter   . Goals of care, counseling/discussion   . Hypoxia   . AKI (acute kidney injury) ([place])   . Acute renal failure with acute renal cortical necrosis superimposed on stage 3 chronic kidney disease ([place]) 08/10/2016  . Impaired glucose tolerance 08/10/2016  . Acute renal failure superimposed on stage 3 chronic kidney disease ([place]) 08/10/2016  .  Obstructive chronic bronchitis with exacerbation ([place])   . Impaired glucose tolerance test   . Acute chest pain   . Low vitamin D level 08/07/2016  . CKD (chronic kidney disease) stage 3, GFR 30-59 ml/min 07/21/2016  . Malnutrition of moderate degree 06/04/2016  . Cellulitis of left foot 05/26/2016  . Acute exacerbation of chronic obstructive pulmonary disease (COPD) ([place]) 05/09/2016  . Cellulitis 04/26/2016  . Chronic respiratory failure with hypoxia ([place]) 04/26/2016  . Hypoxemia 03/30/2016  . Acute on chronic respiratory failure with hypoxia ([place]) 03/29/2016  . Leukocytosis 03/29/2016  . Anemia of chronic disease 03/29/2016  . Essential hypertension 03/29/2016  . HCAP (healthcare-associated pneumonia) 03/28/2016  . B12 deficiency 01/24/2016  . COPD with acute exacerbation ([place]) 01/20/2016  . Pancreatic cancer ([place]) 12/07/2015  . Chronic anticoagulation-Xarelto 09/06/2015  . Gastrointestinal hemorrhage with melena 08/08/2015  . Atrial fibrillation ([place]) 07/17/2015  . Atrial fibrillation with RVR ([place]) 06/27/2015    Palliative Care Assessment & Plan   Patient Profile: 66 y.o. female  with past medical history of Pancreatic adenocarcinoma currently taking chemotherapy, COPD with chronic hypoxic respiratory failure, chronic a fib on Xarelto, anemia of chronic disease, chronic diastolic heart failure, chronic kidney disease stage III, depression and anxiety admitted on 08/15/2016 with COPD exacerbation and HCAP.   Assessment: COPD exacerbation with ongoing HCAP: she is being treated with steroids, winning to oral prednisone, IV antibiotics, bronchodilators.   Pancreatic cancer; Mrs. Brion Aliment had been seeking chemotherapy with the goal of surgical resection/cure, but due to her repeat hospitalizations, she is unable to complete her schedule courses of chemotherapy. At this point, she is incurable.  Recommendations/Plan: Mr. and is being encouraged to accept rehab in a facility for a few  weeks, she is considering her options but, in the past she has declined placement. At this point, continue to treat the treatable.  Continue with chemotherapy every other week as tolerated.  Continue code status discussions.  Goals of Care and Additional Recommendations:  Limitations on Scope of Treatment: Full Scope Treatment  Code Status:    Code Status Orders        Start     Ordered   08/15/16 1828  Full code  Continuous     08/15/16 1832    Code Status History    Date Active Date Inactive Code Status Order ID Comments User Context   08/09/2016  3:54 PM 08/14/2016  6:54 PM Full Code [id number]  Orson Eva, MD Inpatient   07/21/2016  7:53 PM 07/22/2016  4:51 PM DNR [id number]  Karmen Bongo, MD Inpatient   06/08/2016  7:01 PM 06/10/2016  8:01 PM Full Code [id number]  Karmen Bongo, MD Inpatient   06/03/2016 12:44 PM 06/06/2016  7:55 PM Full Code [id number]  [place]y, Silver Huguenin, MD Inpatient   05/26/2016  5:58 PM 06/02/2016  9:02  PM Full Code [id number]  Kathie Dike, MD Inpatient   05/09/2016  2:06 AM 05/12/2016  8:27 PM Full Code [id number]  Oswald Hillock, MD Inpatient   04/26/2016  5:36 PM 05/01/2016 10:05 PM Full Code [id number]  Kathie Dike, MD Inpatient   03/28/2016  7:38 PM 04/04/2016  6:22 PM Full Code [id number]  Waldemar Dickens, MD ED   02/29/2016  4:17 PM 03/02/2016  8:16 PM Full Code [id number]  Damita Lack, MD ED   02/17/2016 10:27 PM 02/19/2016  2:01 PM Full Code [id number]  Karmen Bongo, MD Inpatient   01/28/2016  6:45 AM 02/06/2016 12:03 AM Full Code [id number]  Reubin Milan, MD Inpatient   01/21/2016  1:18 AM 01/23/2016  6:45 PM Full Code [id number]  Oswald Hillock, MD Inpatient   11/27/2015  8:55 PM 12/03/2015  6:34 PM Full Code [id number]  Karmen Bongo, MD Inpatient   08/08/2015  8:53 PM 08/12/2015  9:27 PM Full Code [id number]  Karmen Bongo, MD ED   07/17/2015  7:46 AM 07/19/2015  5:27 PM Full Code [id number]  Jani Gravel, MD Inpatient   06/27/2015  4:40 PM 06/30/2015  5:26 PM  Full Code [id number]  Louellen Molder, MD Inpatient       Prognosis:   Unable to determine based on outcomes. 6 months or less would not be surprising based on burden of pancreatic adenocarcinoma, CHF, COPD exacerbations with 11 hospitalizations in the last 6 months.  Discharge Planning:  To be determined. Ms. considering rehab for monitoring and strength building, but has in the past declined placement.  Care plan was discussed with nursing staff, case manager, social worker, and Dr. Rockne Menghini on next rounds.  Thank you for allowing the Palliative Medicine Team to assist in the care of this patient.   Time In: 1130 Time Out: 1205 Total Time 35 minutes Prolonged Time Billed  no       Greater than 50%  of this time was spent counseling and coordinating care related to the above assessment and plan.  Drue Novel, NP  Please contact Palliative Medicine Team phone at [phone] for questions and concerns.

## 2016-08-22 ENCOUNTER — Other Ambulatory Visit (HOSPITAL_COMMUNITY): Payer: Self-pay | Admitting: Oncology

## 2016-08-22 DIAGNOSIS — Z659 Problem related to unspecified psychosocial circumstances: Secondary | ICD-10-CM

## 2016-08-22 DIAGNOSIS — R0602 Shortness of breath: Secondary | ICD-10-CM

## 2016-08-22 DIAGNOSIS — C259 Malignant neoplasm of pancreas, unspecified: Secondary | ICD-10-CM

## 2016-08-22 LAB — CULTURE, BLOOD (ROUTINE X 2)
CULTURE: NO GROWTH
Culture: NO GROWTH
SPECIAL REQUESTS: ADEQUATE
Special Requests: ADEQUATE

## 2016-08-22 LAB — LEGIONELLA PNEUMOPHILA SEROGP 1 UR AG: L. PNEUMOPHILA SEROGP 1 UR AG: NEGATIVE

## 2016-08-22 LAB — CULTURE, RESPIRATORY W GRAM STAIN

## 2016-08-22 LAB — CULTURE, RESPIRATORY: CULTURE: NORMAL

## 2016-08-22 LAB — GLUCOSE, CAPILLARY
GLUCOSE-CAPILLARY: 211 mg/dL — AB (ref 65–99)
Glucose-Capillary: 118 mg/dL — ABNORMAL HIGH (ref 65–99)

## 2016-08-22 MED ORDER — PREDNISONE 20 MG PO TABS: 40.0000 mg | ORAL_TABLET | Freq: Every day | ORAL | 0 refills | Status: DC

## 2016-08-22 MED ORDER — LEVOFLOXACIN 500 MG PO TABS
500.0000 mg | ORAL_TABLET | Freq: Every day | ORAL | Status: DC
  Administered 2016-08-22: 500 mg via ORAL
  Filled 2016-08-22: qty 1

## 2016-08-22 MED ORDER — ENSURE ENLIVE PO LIQD: 237.0000 mL | Freq: Two times a day (BID) | ORAL | 12 refills | Status: DC

## 2016-08-22 MED ORDER — HYDROCODONE-ACETAMINOPHEN 5-325 MG PO TABS: 1.0000 | ORAL_TABLET | ORAL | 0 refills | Status: AC | PRN

## 2016-08-22 NOTE — Progress Notes (Addendum)
PHARMACY NOTE:  ANTIMICROBIAL RENAL DOSAGE ADJUSTMENT  Current antimicrobial regimen includes a mismatch between antimicrobial dosage and estimated renal function.  As per policy approved by the Pharmacy & Therapeutics and Medical Executive Committees, the antimicrobial dosage will be adjusted accordingly.  Current antimicrobial dosage:  Levaquin 742m PO Daily  Indication: PNA  Renal Function: Estimated Creatinine Clearance: 46.5 mL/min (by C-G formula based on SCr of 1 mg/dL). []      On intermittent HD, scheduled: []      On CRRT    Antimicrobial dosage has been changed to:  Levaquin 500mg   Additional comments:   Thank you for allowing pharmacy to be a part of this patient's care.  Pricilla Larsson, [facility] 08/22/2016 9:16 AM

## 2016-08-22 NOTE — Care Management (Addendum)
CM and CSW met with patient at length to discuss discharge plans. Patient declines SNF. Reports she is thinking of moving to [place] with her aunt. She plans to return home. Declines need for hospital bed or lift as recommended by PT. Per nursing staff, patient is unable to get OOB alone, almost total care. Patient appears exhausted after showering this morning (with 2 person assist) so much so that she has fallen asleep on the bedpan. Patient asked that if she changes her mind in regards to SNF or home equipment to notify CM/CSW. Patient does have WC, RW, O2, and BSC at home. Patient is active with [facility] services) and reports she will still accept these services.

## 2016-08-22 NOTE — Clinical Social Work Note (Signed)
LCSW and CM spoke with patient extensively about discharge planning.  Patient declined SNF repeatedly despite her current care difficulties. Patient plans to return to her home with HHPT (continuation) and indicates that her boyfriend will provide care for her.   LCSW signing off.    Lindsay Salazar, [name], LCSW

## 2016-08-22 NOTE — Discharge Summary (Signed)
Physician Discharge Summary  Lindsay Salazar [facility] NWG:[id number] DOB: 1950/06/16 DOA: 08/15/2016  PCP: [name], PA-C  Admit date: 08/15/2016 Discharge date: 08/22/2016  Admitted From: Home Discharge disposition: Home, refuses SNF despite recommendations   Recommendations for Outpatient Follow-Up:   1. Patient has a poor social situation and refuses placement. 2. Repeat chest x-ray in 4 weeks to ensure resolution of pneumonia.   Discharge Diagnosis:   Principal Problem:    COPD with acute exacerbation (Lindsay Salazar) Active Problems:    Atrial fibrillation with RVR (HCC)    Pancreatic cancer ([place])    HCAP (healthcare-associated pneumonia)    Acute on chronic respiratory failure with hypoxia (HCC)    Anemia of chronic disease    Essential hypertension    CKD (chronic kidney disease) stage 3, GFR 30-59 ml/min    Palliative care encounter    Goals of care, counseling/discussion  Discharge Condition: Guarded.  Diet recommendation: Low sodium, heart healthy.    History of Present Illness:   Lindsay Salazar is an 66 y.o. female with a PMH of pancreatic adenocarcinoma on chemotherapy, COPD/chronic respiratory failure with hypoxia, chronic atrial fibrillation on Xarelto, anemia of chronic disease, chronic diastolic CHF, stage III CKD and depression/anxiety who was admitted 08/15/16 with worsening respiratory status. On admission, she was found to have atrial fibrillation with RVR. Chest x-ray showed increased bibasilar reticular densities concerning for edema versus pneumonia. BNP was 625.   Hospital Course by Problem:   Principal Problem:   COPD with acute exacerbation (HCC)/Healthcare associated lobar pneumonia resulting in acute on chronic hypoxic respiratory failure: Ongoing Patient was placed on empiric vancomycin and cefepime to cover hospital-acquired pathogens (Narrowed to Levaquin today), and has now completed 7 days of antibiotics. Vancomycin discontinued 08/18/16 after MRSA  nasal screen was found to be negative. Blood cultures are growing Escherichia coli and Staph hominis in one of 2 bottles, consistent with contamination. Repeat blood cultures are negative to date. Steroids initiated on admission, weaned to oral prednisone 08/17/16. Was evaluated by the palliative care team on 08/20/16 and continues to want full scope of treatment. Chest x-ray repeated 08/21/16 and showed improving aeration with persistent airspace disease and lobar pneumonia. Recommend repeat chest x-ray in 4 weeks to ensure resolution. Sputum culture grew normal respiratory flora.  Active Problems:   Atrial fibrillation with RVR ([place]): Stable CHADS-VASc at least 3 (age, gender, CHF). Heart rate improved from the 130s to the 57-76 on diltiazem and Lopressor. Continue Xarelto.    Pancreatic cancer [facility]): Stable Last treatment was on 05/15/2016 with Gemcitabine/Abraxane. Plan was initially to undergo restaging MRI of the abdomen and be referred back to Dr. Barry Dienes for consideration of distal pancreatectomy. She now no longer is a candidate for further chemotherapy due to her poor social environment and lack of ability to follow up consistently. Full scope of treatment desired per palliative care evaluation.    Anemia of chronic disease: Stable Baseline hemoglobin 9-11 mg/dL. Hemoglobin stable.    Essential hypertension: Stable Blood pressure controlled on Cardizem, Lasix and when necessary metoprolol.    CKD (chronic kidney disease) stage 3, GFR 30-59 ml/min: Stable Baseline creatinine 1.05. Current creatinine consistent with usual baseline values.    Depression/anxiety: Stable Continue Lexapro and when necessary Ativan.     Poor social situation She has had 11 hospitalizations in the past 6 months, is unable to care for herself and refuses placement. Evaluated by psychiatrist who feels she does have capacity to determine her own discharge disposition.  Hyperglycemia: Improved Likely  steroid-induced. Hemoglobin A1c 5.7% 06/08/16. Should improve with steroid taper.  Medical Consultants:    Psychiatry  Palliative Care  Discharge Exam:   Vitals:   08/22/16 0604 08/22/16 1004  BP: 128/78 136/78  Pulse: 80 (!) 103  Resp: 18 20  Temp: 98.2 F (36.8 C) 97.6 F (36.4 C)   Vitals:   08/22/16 0604 08/22/16 1004 08/22/16 1011 08/22/16 1235  BP: 128/78 136/78    Pulse: 80 (!) 103    Resp: 18 20    Temp: 98.2 F (36.8 C) 97.6 F (36.4 C)    TempSrc: Oral Oral    SpO2: 100% 100% 94%   Weight:    60.2 kg (132 lb 11.2 oz)  Height:        General exam: Appears angry and depressed over her impending discharge. Respiratory system: Coarse wheezes and rhonchi, unchanged. Respiratory effort mildly increased. Cardiovascular system: Heart sounds are irregularly irregular, mildly tachycardic. No JVD,  rubs, gallops or clicks. No murmurs. Gastrointestinal system: Abdomen is nondistended, soft and nontender. No organomegaly or masses felt. Normal bowel sounds heard. Central nervous system: Alert and oriented. Cranial nerves II through XII grossly intact, moves all extremities 4 with equal strength but generalized weakness present. Extremities: No clubbing,  or cyanosis. Trace edema. Skin: No rashes, lesions or ulcers. Psychiatry: Judgement and insight appear significantly impaired. Mood & affect depressed, anxious, angry.    The results of significant diagnostics from this hospitalization (including imaging, microbiology, ancillary and laboratory) are listed below for reference.     Procedures and Diagnostic Studies:   Dg Chest 2 View  Result Date: 08/15/2016 CLINICAL DATA:  Cough.  Hypoxia. EXAM: CHEST  2 VIEW COMPARISON:  Radiographs of August 09, 2016. FINDINGS: The heart size and mediastinal contours are within normal limits. Atherosclerosis of thoracic aorta is noted. Right internal jugular Port-A-Cath is unchanged in position. Increased bibasilar reticular densities  are noted, right greater than left concerning for worsening edema or pneumonia. No pneumothorax is noted. Small right pleural effusion cannot be excluded. The visualized skeletal structures are unremarkable. IMPRESSION: Increased bibasilar reticular densities are noted, right greater than left, concerning for worsening edema or pneumonia. Electronically Signed   By: Marijo Conception, M.D.   On: 08/15/2016 16:41     Labs:   Basic Metabolic Panel:  Recent Labs Lab 08/15/16 1602 08/16/16 0536 08/18/16 0610 08/19/16 0900  NA 136 136 137 134*  K 3.5 4.1 4.7 4.6  CL 103 105 103 99*  CO2 25 23 24 23   GLUCOSE 112* 209* 200* 240*  BUN 36* 37* 42* 40*  CREATININE 1.09* 1.05* 1.06* 1.00  CALCIUM 8.7* 8.1* 8.3* 8.3*   GFR Estimated Creatinine Clearance: 46.5 mL/min (by C-G formula based on SCr of 1 mg/dL).  CBC:  Recent Labs Lab 08/15/16 1602 08/16/16 0536 08/17/16 1507 08/19/16 0900  WBC 10.4 7.5 12.0* 21.1*  NEUTROABS 8.9* 6.8  --   --   HGB 9.3* 8.8* 9.6* 9.4*  HCT 29.5* 26.8* 29.8* 29.2*  MCV 93.7 90.8 92.5 92.1  PLT 190 196 233 211   Cardiac Enzymes:  Recent Labs Lab 08/15/16 1602  TROPONINI <0.03   CBG:  Recent Labs Lab 08/21/16 1623 08/21/16 2028 08/21/16 2228 08/22/16 0724 08/22/16 1126  GLUCAP 174* 218* 214* 211* 118*   Microbiology Recent Results (from the past 240 hour(s))  Culture, blood (routine x 2) Call MD if unable to obtain prior to antibiotics being given  Status: None   Collection Time: 08/15/16  7:45 PM  Result Value Ref Range Status   Specimen Description BLOOD RIGHT ARM  Final   Special Requests   Final    Blood Culture results may not be optimal due to an inadequate volume of blood received in culture bottles   Culture NO GROWTH 5 DAYS  Final   Report Status 08/20/2016 FINAL  Final  Culture, blood (routine x 2) Call MD if unable to obtain prior to antibiotics being given     Status: Abnormal   Collection Time: 08/15/16  7:58 PM    Result Value Ref Range Status   Specimen Description LEFT ANTECUBITAL  Final   Special Requests Blood Culture adequate volume  Final   Culture  Setup Time   Final    GRAM POSITIVE COCCI Gram Stain Report Called to,Read Back By and Verified With: WATKINS,T. AT 1093 ON 08/16/2016 BY EVA ANAEROBIC BOTTLE ONLY Performed at [facility] BY AND VERIFIED WITH: Ivonne Andrew, RN 2100 08/16/2016 T. Sherre Poot NEGATIVE RODS AEROBIC BOTTLE Gram Stain Report Called to,Read Back By and Verified With: HAWKINS,J @ 1218 BY BOWMAN,L CRITICAL RESULT CALLED TO, READ BACK BY AND VERIFIED WITH: Reola Mosher, RN [phone] 08/17/2016 T. TYSOR    Culture (A)  Final    STAPHYLOCOCCUS HOMINIS THE SIGNIFICANCE OF ISOLATING THIS ORGANISM FROM A SINGLE SET OF BLOOD CULTURES WHEN MULTIPLE SETS ARE DRAWN IS UNCERTAIN. PLEASE NOTIFY THE MICROBIOLOGY DEPARTMENT WITHIN ONE WEEK IF SPECIATION AND SENSITIVITIES ARE REQUIRED. ESCHERICHIA COLI    Report Status 08/19/2016 FINAL  Final   Organism ID, Bacteria STAPHYLOCOCCUS HOMINIS  Final   Organism ID, Bacteria ESCHERICHIA COLI  Final      Susceptibility   Escherichia coli - MIC*    AMPICILLIN <=2 SENSITIVE Sensitive     CEFAZOLIN <=4 SENSITIVE Sensitive     CEFEPIME <=1 SENSITIVE Sensitive     CEFTAZIDIME <=1 SENSITIVE Sensitive     CEFTRIAXONE <=1 SENSITIVE Sensitive     CIPROFLOXACIN >=4 RESISTANT Resistant     GENTAMICIN <=1 SENSITIVE Sensitive     IMIPENEM <=0.25 SENSITIVE Sensitive     TRIMETH/SULFA <=20 SENSITIVE Sensitive     AMPICILLIN/SULBACTAM <=2 SENSITIVE Sensitive     PIP/TAZO <=4 SENSITIVE Sensitive     Extended ESBL NEGATIVE Sensitive     * ESCHERICHIA COLI   Staphylococcus hominis - MIC*    AMPICILLIN <=2 SENSITIVE Sensitive     CEFAZOLIN <=4 SENSITIVE Sensitive     CEFEPIME <=1 SENSITIVE Sensitive     CEFTAZIDIME <=1 SENSITIVE Sensitive     CEFTRIAXONE <=1 SENSITIVE Sensitive     CIPROFLOXACIN >=4 RESISTANT  Resistant     GENTAMICIN <=1 SENSITIVE Sensitive     IMIPENEM <=0.25 SENSITIVE Sensitive     TRIMETH/SULFA <=20 SENSITIVE Sensitive     AMPICILLIN/SULBACTAM <=2 SENSITIVE Sensitive     PIP/TAZO <=4 SENSITIVE Sensitive     Extended ESBL NEGATIVE Sensitive     * STAPHYLOCOCCUS HOMINIS  Blood Culture ID Panel (Reflexed)     Status: Abnormal   Collection Time: 08/15/16  7:58 PM  Result Value Ref Range Status   Enterococcus species NOT DETECTED NOT DETECTED Final   Listeria monocytogenes NOT DETECTED NOT DETECTED Final   Staphylococcus species DETECTED (A) NOT DETECTED Final    Comment: Methicillin (oxacillin) resistant coagulase negative staphylococcus. Possible blood culture contaminant (unless isolated from more than one blood culture draw or clinical case suggests pathogenicity).  No antibiotic treatment is indicated for blood  culture contaminants. CRITICAL RESULT CALLED TO, READ BACK BY AND VERIFIED WITH: Ivonne Andrew, RN 2100 08/16/2016 T. TYSOR    Staphylococcus aureus NOT DETECTED NOT DETECTED Final   Methicillin resistance DETECTED (A) NOT DETECTED Final    Comment: CRITICAL RESULT CALLED TO, READ BACK BY AND VERIFIED WITH: Ivonne Andrew, RN 2100 08/16/2016 T. TYSOR    Streptococcus species NOT DETECTED NOT DETECTED Final   Streptococcus agalactiae NOT DETECTED NOT DETECTED Final   Streptococcus pneumoniae NOT DETECTED NOT DETECTED Final   Streptococcus pyogenes NOT DETECTED NOT DETECTED Final   Acinetobacter baumannii NOT DETECTED NOT DETECTED Final   Enterobacteriaceae species NOT DETECTED NOT DETECTED Final   Enterobacter cloacae complex NOT DETECTED NOT DETECTED Final   Escherichia coli NOT DETECTED NOT DETECTED Final   Klebsiella oxytoca NOT DETECTED NOT DETECTED Final   Klebsiella pneumoniae NOT DETECTED NOT DETECTED Final   Proteus species NOT DETECTED NOT DETECTED Final   Serratia marcescens NOT DETECTED NOT DETECTED Final   Haemophilus influenzae NOT DETECTED NOT  DETECTED Final   Neisseria meningitidis NOT DETECTED NOT DETECTED Final   Pseudomonas aeruginosa NOT DETECTED NOT DETECTED Final   Candida albicans NOT DETECTED NOT DETECTED Final   Candida glabrata NOT DETECTED NOT DETECTED Final   Candida krusei NOT DETECTED NOT DETECTED Final   Candida parapsilosis NOT DETECTED NOT DETECTED Final   Candida tropicalis NOT DETECTED NOT DETECTED Final  Blood Culture ID Panel (Reflexed)     Status: Abnormal   Collection Time: 08/15/16  7:58 PM  Result Value Ref Range Status   Enterococcus species NOT DETECTED NOT DETECTED Final   Listeria monocytogenes NOT DETECTED NOT DETECTED Final   Staphylococcus species NOT DETECTED NOT DETECTED Final   Staphylococcus aureus NOT DETECTED NOT DETECTED Final   Streptococcus species NOT DETECTED NOT DETECTED Final   Streptococcus agalactiae NOT DETECTED NOT DETECTED Final   Streptococcus pneumoniae NOT DETECTED NOT DETECTED Final   Streptococcus pyogenes NOT DETECTED NOT DETECTED Final   Acinetobacter baumannii NOT DETECTED NOT DETECTED Final   Enterobacteriaceae species DETECTED (A) NOT DETECTED Final    Comment: Enterobacteriaceae represent a large family of gram-negative bacteria, not a single organism. CRITICAL RESULT CALLED TO, READ BACK BY AND VERIFIED WITH: Reola Mosher, RN [phone] 08/17/2016 T. TYSOR    Enterobacter cloacae complex NOT DETECTED NOT DETECTED Final   Escherichia coli DETECTED (A) NOT DETECTED Final    Comment: CRITICAL RESULT CALLED TO, READ BACK BY AND VERIFIED WITH: Reola Mosher, RN [phone] 08/17/2016 T. TYSOR    Klebsiella oxytoca NOT DETECTED NOT DETECTED Final   Klebsiella pneumoniae NOT DETECTED NOT DETECTED Final   Proteus species NOT DETECTED NOT DETECTED Final   Serratia marcescens NOT DETECTED NOT DETECTED Final   Carbapenem resistance NOT DETECTED NOT DETECTED Final   Haemophilus influenzae NOT DETECTED NOT DETECTED Final   Neisseria meningitidis NOT DETECTED NOT DETECTED Final    Pseudomonas aeruginosa NOT DETECTED NOT DETECTED Final   Candida albicans NOT DETECTED NOT DETECTED Final   Candida glabrata NOT DETECTED NOT DETECTED Final   Candida krusei NOT DETECTED NOT DETECTED Final   Candida parapsilosis NOT DETECTED NOT DETECTED Final   Candida tropicalis NOT DETECTED NOT DETECTED Final  Culture, blood (routine x 2)     Status: None   Collection Time: 08/17/16  1:37 PM  Result Value Ref Range Status   Specimen Description BLOOD RIGHT ARM  Final   Special Requests Blood Culture  adequate volume  Final   Culture NO GROWTH 5 DAYS  Final   Report Status 08/22/2016 FINAL  Final  Culture, blood (routine x 2)     Status: None   Collection Time: 08/17/16  1:45 PM  Result Value Ref Range Status   Specimen Description BLOOD RIGHT HAND  Final   Special Requests Blood Culture adequate volume  Final   Culture NO GROWTH 5 DAYS  Final   Report Status 08/22/2016 FINAL  Final  Culture, sputum-assessment     Status: None   Collection Time: 08/19/16  4:50 PM  Result Value Ref Range Status   Specimen Description EXPECTORATED SPUTUM  Final   Special Requests NONE  Final   Sputum evaluation   Final    THIS SPECIMEN IS ACCEPTABLE FOR SPUTUM CULTURE PERFORMED AT [facility]    Report Status 08/19/2016 FINAL  Final  Culture, respiratory (NON-Expectorated)     Status: None   Collection Time: 08/19/16  4:50 PM  Result Value Ref Range Status   Specimen Description EXPECTORATED SPUTUM  Final   Special Requests NONE Reflexed from [id number]  Final   Gram Stain   Final    ABUNDANT WBC PRESENT,BOTH PMN AND MONONUCLEAR NO ORGANISMS SEEN    Culture   Final    Consistent with normal respiratory flora. Performed at [facility], [place] [address] [place], [place] [address]    Report Status 08/22/2016 FINAL  Final     Discharge Instructions:   Discharge Instructions    Call MD for:  difficulty breathing, headache or visual disturbances    Complete by:  As directed    Call MD for:   extreme fatigue    Complete by:  As directed    Call MD for:  persistant dizziness or light-headedness    Complete by:  As directed    Call MD for:  temperature >100.4    Complete by:  As directed    Diet - low sodium heart healthy    Complete by:  As directed    Discharge instructions    Complete by:  As directed    You take pain medications for a chronic condition, and must return to your prescribing provider to get refills on these medications.  For your safety, and to provide you with the highest standard of care, [facility] has a policy that no refills on chronic pain medications will be provided (unless it is after hours or on a weekend, in which case you will be provided with a prescription for a limited amount of medication to cover your needs until your doctor's office re-opens), as it is imperative that you receive your refills from the doctor who prescribes these medications to you.  Prescriptions from multiple providers can compromise your safety and contribute to problems with increased tolerance, addiction or unintentional overdosages.  Please contact your PCP office upon discharge if you need refills on your pain medications.  If you are out of your pain medications, contact your usual prescriber as soon as possible so that you can get your medicine refilled.  Make sure you call during regular office hours, as most prescribers do not write prescriptions for pain medicines after hours or on weekends.  We will NOT prescribe chronic pain medications to anyone who is not under the care of a PCP, as we feel these medications must be monitored closely for your safety.  You last had your hydrocodone filled for 60 tablets on 07/31/16.  We recommend you  contact [facility] Pane for a refill on this medication.   Increase activity slowly    Complete by:  As directed      Allergies as of 08/22/2016      Reactions   Ibuprofen Nausea And Vomiting   Other    Seafood   Penicillins Swelling     Has patient had a PCN reaction causing immediate rash, facial/tongue/throat swelling, SOB or lightheadedness with hypotension: Yes Has patient had a PCN reaction causing severe rash involving mucus membranes or skin necrosis: No Has patient had a PCN reaction that required hospitalization No Has patient had a PCN reaction occurring within the last 10 years: No If all of the above answers are "NO", then may proceed with Cephalosporin use.   Tiotropium Bromide Monohydrate Itching   Tramadol Nausea And Vomiting      Medication List    STOP taking these medications   levofloxacin 500 MG tablet Commonly known as:  LEVAQUIN     TAKE these medications   diltiazem 240 MG 24 hr capsule Commonly known as:  CARDIZEM CD Take 1 capsule (240 mg total) by mouth daily.   docusate sodium 100 MG capsule Commonly known as:  COLACE Take 1 capsule (100 mg total) by mouth 2 (two) times daily.   ergocalciferol 50000 units capsule Commonly known as:  VITAMIN D2 Take 1 capsule (50,000 Units total) by mouth once a week.   escitalopram 10 MG tablet Commonly known as:  LEXAPRO Take 1 tablet by mouth daily.   feeding supplement (ENSURE ENLIVE) Liqd Take 237 mLs by mouth 2 (two) times daily between meals.   furosemide 40 MG tablet Commonly known as:  LASIX Take 1 tablet (40 mg total) by mouth daily.   guaiFENesin 600 MG 12 hr tablet Commonly known as:  MUCINEX Take 2 tablets (1,200 mg total) by mouth 2 (two) times daily.   HYDROcodone-acetaminophen 5-325 MG tablet Commonly known as:  NORCO/VICODIN Take 1 tablet by mouth every 4 (four) hours as needed.   ipratropium-albuterol 0.5-2.5 (3) MG/3ML Soln Commonly known as:  DUONEB Inhale 3 mLs into the lungs every 6 (six) hours as needed (for shortness of breath).   COMBIVENT RESPIMAT 20-100 MCG/ACT Aers respimat Generic drug:  Ipratropium-Albuterol Inhale 1 puff into the lungs every 6 (six) hours as needed for wheezing. Notes to patient:  USE  THIS MEDICATION ONLY AS NEEDED   LORazepam 0.5 MG tablet Commonly known as:  ATIVAN Take 1 tablet (0.5 mg total) by mouth every 6 (six) hours as needed for anxiety. Notes to patient:  EVERY SIX HOURS FOR ANXIETY/NERVES   metoprolol tartrate 50 MG tablet Commonly known as:  LOPRESSOR Take 1 tablet (50 mg total) by mouth 2 (two) times daily.   OXYGEN Inhale 2 L into the lungs daily as needed (for breathing).   pantoprazole 40 MG tablet Commonly known as:  PROTONIX Take 1 tablet (40 mg total) by mouth daily.   potassium chloride SA 20 MEQ tablet Commonly known as:  K-DUR,KLOR-CON Take 1 tablet (20 mEq total) by mouth daily.   predniSONE 20 MG tablet Commonly known as:  DELTASONE Take 2 tablets (40 mg total) by mouth daily with breakfast. Notes to patient:  PLEASE FOLLOW DIRECTIONS CAREFULLY   Rivaroxaban 15 MG Tabs tablet Commonly known as:  XARELTO Take 1 tablet (15 mg total) by mouth daily with supper.   SYMBICORT 160-4.5 MCG/ACT inhaler Generic drug:  budesonide-formoterol Inhale 1 puff into the lungs 2 (two) times daily.  temazepam 15 MG capsule Commonly known as:  RESTORIL Take 15 mg by mouth at bedtime.      Follow-up Information    Sharee Pimple, Alfonso Ellis. Schedule an appointment as soon as possible for a visit in 1 week(s).   Specialty:  Physician Assistant Why:  Hospital follow up. Contact information: [address] [place] [address] [phone]            Time coordinating discharge: 35 minutes.  Signed:  Juelz Claar  Pager [phone] [facility] 08/22/2016, 1:58 PM

## 2016-08-22 NOTE — Discharge Instructions (Signed)
Acute Respiratory Distress Syndrome, Adult Acute respiratory distress syndrome is a life-threatening condition in which fluid collects in the lungs. This prevents the lungs from filling with air and passing oxygen into the blood. This can cause the lungs and other vital organs to fail. The condition usually develops following an infection, illness, surgery, or injury. What are the causes? This condition may be caused by:  An infection, such as sepsis or pneumonia.  A serious injury to the head or chest.  Severe bleeding from an injury.  A major surgery.  Breathing in harmful chemicals or smoke.  Blood transfusions.  A blood clot in the lungs.  Breathing in vomit (aspiration).  Near-drowning.  Inflammation of the pancreas (pancreatitis).  A drug overdose.  What are the signs or symptoms? Sudden shortness of breath and rapid breathing are the main symptoms of this condition. Other symptoms may include:  A fast or irregular heartbeat.  Skin, lips, or fingernails that look blue (cyanosis).  Confusion.  Tiredness or loss of energy.  Chest pain, particularly while taking a breath.  Coughing.  Restlessness or anxiety.  Fever. This is usually present if there is an underlying infection, such as pneumonia.  How is this diagnosed? This condition is diagnosed based on:  Your symptoms.  Medical history.  A physical exam. During the exam, your health care provider will listen to your heart and check for crackling or wheezing sounds in your lungs.  You may also have other tests to confirm the diagnosis and measure how well your lungs are working. These may include:  Measuring the amount of oxygen in your blood. Your health care provider will use two methods to do this procedure: ? A small device (pulse oximeter) that is placed on your finger, earlobe, or toe. ? An arterial blood gas test. A sample of blood is taken from an artery and tested for oxygen levels.  Blood  tests.  Chest X-rays or CT scans to look for fluid in the lungs.  Taking a sample of your sputum to test for infection.  Heart test, such as an echocardiogram or electrocardiogram. This is done to rule out any heart problems (such as heart failure) that may be causing your symptoms.  Bronchoscopy. During this test, a thin, flexible tube with a light is passed into the mouth or nose, down the windpipe, and into the lungs.  How is this treated? Treatment depends on the cause of your condition. The goal is to support you while your lungs heal and the underlying cause is treated. Treatment may include:  Oxygen therapy. This may be done through: ? A tube in your nose or a face mask. ? A ventilator. This device helps move air into and out of your lungs through a breathing tube that is inserted into your mouth or nose.  Continuous positive airway pressure (CPAP). This treatment uses mild air pressure to keep the airways open. A mask or other device will be placed over your nose or mouth.  Tracheostomy. During this procedure, a small cut is made in your neck to create an opening to your windpipe. A breathing tube is placed directly into your windpipe. The breathing tube is connected to a ventilator. This is done if you have problems with your airway or if you need a ventilator for a long period of time.  Positioning you to lie on your stomach (prone position).  Medicines, such as: ? Sedatives to help you relax. ? Blood pressure medicines. ? Antibiotics to treat  infection. ? Blood thinners to prevent blood clots. ? Diuretics to help prevent excess fluid.  Fluids and nutrients given through an IV tube.  Wearing compression stockings on your legs to prevent blood clots.  Extra corporeal membrane oxygenation (ECMO). This treatment takes blood outside your body, adds oxygen, and removes carbon dioxide. The blood is then returned to your body. This treatment is only used in severe  cases.  Follow these instructions at home:  Take over-the-counter and prescription medicines only as told by your health care provider.  Do not use any products that contain nicotine or tobacco, such as cigarettes and e-cigarettes. If you need help quitting, ask your health care provider.  Limit alcohol intake to no more than 1 drink per day for nonpregnant women and 2 drinks per day for men. One drink equals 12 oz of beer, 5 oz of wine, or 1 oz of hard liquor.  Ask friends and family to help you if daily activities make you tired.  Attend any pulmonary rehabilitation as told by your health care provider. This may include: ? Education about your condition. ? Exercises. ? Breathing training. ? Counseling. ? Learning techniques to conserve energy. ? Nutrition counseling.  Keep all follow-up visits as told by your health care provider. This is important. Contact a health care provider if:  You become short of breath during activity or while resting.  You develop a cough that does not go away.  You have a fever.  Your symptoms do not get better or they get worse.  You become anxious or depressed. Get help right away if:  You have sudden shortness of breath.  You develop sudden chest pain that does not go away.  You develop a rapid heart rate.  You develop swelling or pain in one of your legs.  You cough up blood.  You have trouble breathing.  Your skin, lips, or fingernails turn blue. These symptoms may represent a serious problem that is an emergency. Do not wait to see if the symptoms will go away. Get medical help right away. Call your local emergency services (911 in the [place] Do not drive yourself to the hospital. Summary  Acute respiratory distress syndrome is a life-threatening condition in which fluid collects in the lungs, which leads the lungs and other vital organs to fail.  This condition usually develops following an infection, illness, surgery, or  injury.  Sudden shortness of breath and rapid breathing are the main symptoms of acute respiratory distress syndrome.  Treatment may include oxygen therapy, continuous positive airway pressure (CPAP), tracheostomy, lying on your stomach (prone position), medicines, fluids and nutrients given through an IV tube, compression stockings, and extra corporeal membrane oxygenation (ECMO). This information is not intended to replace advice given to you by your health care provider. Make sure you discuss any questions you have with your health care provider. Document Released: 01/20/2005 Document Revised: 01/07/2016 Document Reviewed: 01/07/2016 Elsevier Interactive Patient Education  2017 [employer].   Acute Respiratory Failure, Adult Acute respiratory failure occurs when there is not enough oxygen passing from your lungs to your body. When this happens, your lungs have trouble removing carbon dioxide from the blood. This causes your blood oxygen level to drop too low as carbon dioxide builds up. Acute respiratory failure is a medical emergency. It can develop quickly, but it is temporary if treated promptly. Your lung capacity, or how much air your lungs can hold, may improve with time, exercise, and treatment. What are the  causes? There are many possible causes of acute respiratory failure, including:  Lung injury.  Chest injury or damage to the ribs or tissues near the lungs.  Lung conditions that affect the flow of air and blood into and out of the lungs, such as pneumonia, acute respiratory distress syndrome, and cystic fibrosis.  Medical conditions, such as strokes or spinal cord injuries, that affect the muscles and nerves that control breathing.  Blood infection (sepsis).  Inflammation of the pancreas (pancreatitis).  A blood clot in the lungs (pulmonary embolism).  A large-volume blood transfusion.  Burns.  Near-drowning.  Seizure.  Smoke inhalation.  Reaction to  medicines.  Alcohol or drug overdose.  What increases the risk? This condition is more likely to develop in people who have:  A blocked airway.  Asthma.  A condition or disease that damages or weakens the muscles, nerves, bones, or tissues that are involved in breathing.  A serious infection.  A health problem that blocks the unconscious reflex that is involved in breathing, such as hypothyroidism or sleep apnea.  A lung injury or trauma.  What are the signs or symptoms? Trouble breathing is the main symptom of acute respiratory failure. Symptoms may also include:  Rapid breathing.  Restlessness or anxiety.  Skin, lips, or fingernails that appear blue (cyanosis).  Rapid heart rate.  Abnormal heart rhythms (arrhythmias).  Confusion or changes in behavior.  Tiredness or loss of energy.  Feeling sleepy or having a loss of consciousness.  How is this diagnosed? Your health care provider can diagnose acute respiratory failure with a medical history and physical exam. During the exam, your health care provider will listen to your heart and check for crackling or wheezing sounds in your lungs. Your may also have tests to confirm the diagnosis and determine what is causing respiratory failure. These tests may include:  Measuring the amount of oxygen in your blood (pulse oximetry). The measurement comes from a small device that is placed on your finger, earlobe, or toe.  Other blood tests to measure blood gases and to look for signs of infection.  Sampling your cerebral spinal fluid or tracheal fluid to check for infections.  Chest X-ray to look for fluid in spaces that should be filled with air.  Electrocardiogram (ECG) to look at the heart's electrical activity.  How is this treated? Treatment for this condition usually takes places in a hospital intensive care unit (ICU). Treatment depends on what is causing the condition. It may include one or more treatments until your  symptoms improve. Treatment may include:  Supplemental oxygen. Extra oxygen is given through a tube in the nose, a face mask, or a hood.  A device such as a continuous positive airway pressure (CPAP) or bi-level positive airway pressure (BiPAP or BPAP) machine. This treatment uses mild air pressure to keep the airways open. A mask or other device will be placed over your nose or mouth. A tube that is connected to a motor will deliver oxygen through the mask.  Ventilator. This treatment helps move air into and out of the lungs. This may be done with a bag and mask or a machine. For this treatment, a tube is placed in your windpipe (trachea) so air and oxygen can flow to the lungs.  Extracorporeal membrane oxygenation (ECMO). This treatment temporarily takes over the function of the heart and lungs, supplying oxygen and removing carbon dioxide. ECMO gives the lungs a chance to recover. It may be used if a ventilator  is not effective. °· Tracheostomy. This is a procedure that creates a hole in the neck to insert a breathing tube. °· Receiving fluids and medicines. °· Rocking the bed to help breathing. ° °Follow these instructions at home: °· Take over-the-counter and prescription medicines only as told by your health care provider. °· Return to normal activities as told by your health care provider. Ask your health care provider what activities are safe for you. °· Keep all follow-up visits as told by your health care provider. This is important. °How is this prevented? °Treating infections and medical conditions that may lead to acute respiratory failure can help prevent the condition from developing. °Contact a health care provider if: °· You have a fever. °· Your symptoms do not improve or they get worse. °Get help right away if: °· You are having trouble breathing. °· You lose consciousness. °· Your have cyanosis or turn blue. °· You develop a rapid heart rate. °· You are confused. °These symptoms may  represent a serious problem that is an emergency. Do not wait to see if the symptoms will go away. Get medical help right away. Call your local emergency services (911 in the [place] Do not drive yourself to the hospital. °This information is not intended to replace advice given to you by your health care provider. Make sure you discuss any questions you have with your health care provider. °Document Released: 01/25/2013 Document Revised: 08/18/2015 Document Reviewed: 08/08/2015 °Elsevier Interactive Patient Education © 2018 [employer] Inc. ° °

## 2016-08-22 NOTE — Progress Notes (Signed)
Pt d/c'd home with boyfriend at bedside.  Boyfriend and Pt demanding prescription for "pain pills".  As per the [facility], Pt was given a prescription for Norco 10/325 Q4hours PRN  07/31/2016 and has none left.  No RX given to Pt this shift for Pain.  Pt demanding staff take her to Cancer center on 4th floor upon her d/c today from [facility].  It was explained to her, that once discharged from [facility], she could go to the [facility] and staff would not be with her.  Her boyfriend stated he would take her.   Pt also states that if she does not get a prescription for pain pills, that she would "just go back to the ER and be admitted again."  Pt d/c'd and assisted to front door of hospital with boyfriend at her side. She is in stable condition and denies pain currently.

## 2016-08-22 NOTE — Plan of Care (Signed)
Problem: Activity: Goal: Risk for activity intolerance will decrease Outcome: Not Progressing Pt encouraged to ambulate. She was agreeable to ambulate in room with walker after receiving pain medication. After medication administration patient refused to ambulate in the room and stated she would try tomorrow with physical therapy.

## 2016-08-22 NOTE — Progress Notes (Signed)
Daily Progress Note   Patient Name: Lindsay Salazar [facility]       Date: 08/22/2016 DOB: Aug 16, 1950  Age: 66 y.o. MRN#: [id number] Attending Physician: Tonye Royalty, MD Primary Care Physician: Vesta Mixer Admit Date: 08/15/2016  Reason for Consultation/Follow-up: Establishing goals of care and Psychosocial/spiritual support  Subjective: Lindsay Salazar is resting quietly in bed. She makes but does not keep eye contact. She is upset about her discharge, but continues to decline rehab placement. Present today is Lindsay Salazar.  We briefly talk about cancer treatments and that she may be too weak for further treatments. Lindsay Salazar often changes the subject when difficult subjects are discussed.  We talk about code status and medical teams recommendation to treat the treatable, but no CPR or intubation.    Length of Stay: 7  Current Medications: Scheduled Meds:  . diltiazem  240 mg Oral Daily  . docusate sodium  100 mg Oral BID  . escitalopram  10 mg Oral Daily  . feeding supplement (ENSURE ENLIVE)  237 mL Oral BID BM  . furosemide  40 mg Oral Daily  . guaiFENesin  1,200 mg Oral BID  . insulin aspart  0-15 Units Subcutaneous TID WC  . insulin aspart  0-5 Units Subcutaneous QHS  . insulin aspart  4 Units Subcutaneous TID WC  . ipratropium  0.5 mg Nebulization Q6H  . levalbuterol  0.63 mg Nebulization Q6H  . levofloxacin  500 mg Oral Daily  . metoprolol tartrate  50 mg Oral BID  . mometasone-formoterol  2 puff Inhalation BID  . pantoprazole  40 mg Oral Daily  . potassium chloride SA  20 mEq Oral Daily  . predniSONE  40 mg Oral Daily  . Rivaroxaban  15 mg Oral Q supper  . sodium chloride flush  3 mL Intravenous Q12H  . temazepam  15 mg Oral QHS    Continuous Infusions: . sodium chloride      PRN  Meds: sodium chloride, HYDROcodone-acetaminophen, LORazepam, metoprolol tartrate, sodium chloride flush  Physical Exam  Constitutional: She is oriented to person, place, and time. No distress.  Frail and thin  HENT:  Head: Atraumatic.  Cardiovascular: Normal rate.   Pulmonary/Chest: Effort normal. No respiratory distress.  Abdominal: Soft. She exhibits no distension.  Musculoskeletal: She exhibits no edema.  Muscle wasting  Neurological: She is alert and oriented to person, place, and time.  Skin: Skin is warm and dry.  Purple bruising to bl forearms  Nursing note and vitals reviewed.           Vital Signs: BP 136/78 (BP Location: Right Arm)   Pulse (!) 103   Temp 97.6 F (36.4 C) (Oral)   Resp 20   Ht 5\' 4"  (1.626 m)   Wt 60.2 kg (132 lb 11.2 oz)   SpO2 94%   BMI 22.78 kg/m  SpO2: SpO2: 94 % O2 Device: O2 Device: Nasal Cannula O2 Flow Rate: O2 Flow Rate (L/min): 2 L/min  Intake/output summary:  Intake/Output Summary (Last 24 hours) at 08/22/16 1414 Last data filed at 08/22/16 1139  Gross per 24 hour  Intake              720 ml  Output             1400 ml  Net             -680 ml   LBM: Last BM Date: 08/21/16 Baseline Weight: Weight: 56.4 kg (124 lb 4.8 oz) Most recent weight: Weight: 60.2 kg (132 lb 11.2 oz)       Palliative Assessment/Data:    Flowsheet Rows     Most Recent Value  Intake Tab  Referral Department  Oncology  Unit at Time of Referral  Cardiac/Telemetry Unit  Palliative Care Primary Diagnosis  Pulmonary  Date Notified  08/19/16  Palliative Care Type  New Palliative care  Reason for referral  Clarify Goals of Care  Date of Admission  08/15/16  Date first seen by Palliative Care  08/20/16  # of days Palliative referral response time  1 Day(s)  # of days IP prior to Palliative referral  4  Clinical Assessment  Palliative Performance Scale Score  30%  Pain Max last 24 hours  Not able to report  Pain Min Last 24 hours  Not able to report    Dyspnea Max Last 24 Hours  Not able to report  Dyspnea Min Last 24 hours  Not able to report  Psychosocial & Spiritual Assessment  Palliative Care Outcomes  Patient/Family meeting held?  Yes  Who was at the meeting?  Patient only today  Palliative Care Outcomes  Clarified goals of care, Provided psychosocial or spiritual support, Provided advance care planning      Patient Active Problem List   Diagnosis Date Noted  . Poor social situation 08/22/2016  . Palliative care encounter   . Goals of care, counseling/discussion   . Hypoxia   . AKI (acute kidney injury) ([place])   . 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 Impaired glucose tolerance 08/10/2016  . Acute renal failure superimposed on stage 3 chronic kidney disease ([place]) 08/10/2016  . Obstructive chronic bronchitis with exacerbation ([place])   . Impaired glucose tolerance test   . Acute chest pain   . Low vitamin D level 08/07/2016  . CKD (chronic kidney disease) stage 3, GFR 30-59 ml/min 07/21/2016  . Malnutrition of moderate degree 06/04/2016  . Cellulitis of left foot 05/26/2016  . Acute exacerbation of chronic obstructive pulmonary disease (COPD) ([place]) 05/09/2016  . Cellulitis 04/26/2016  . Chronic respiratory failure with hypoxia ([place]) 04/26/2016  . Hypoxemia 03/30/2016  . Acute on chronic respiratory failure with hypoxia ([place]) 03/29/2016  . Leukocytosis 03/29/2016  . Anemia of chronic disease 03/29/2016  .  Essential hypertension 03/29/2016  . HCAP (healthcare-associated pneumonia) 03/28/2016  . B12 deficiency 01/24/2016  . COPD with acute exacerbation ([place]) 01/20/2016  . Pancreatic cancer ([place]) 12/07/2015  . Chronic anticoagulation-Xarelto 09/06/2015  . Gastrointestinal hemorrhage with melena 08/08/2015  . Atrial fibrillation ([place]) 07/17/2015  . Atrial fibrillation with RVR ([place]) 06/27/2015    Palliative Care Assessment & Plan    Patient Profile: 66 y.o.femalewith past medical history of Pancreatic adenocarcinoma currently taking chemotherapy, COPD with chronic hypoxic respiratory failure, chronic a fib on Xarelto, anemia of chronic disease, chronic diastolic heart failure, chronic kidney disease stage III, depression and anxietyadmitted on 7/13/2018with COPD exacerbation and HCAP.   Assessment: COPD exacerbation with ongoing HCAP: she is being treated with steroids, winning to oral prednisone, IV antibiotics, bronchodilators.  change to PO antibx today. Encourage to continue full dose.  Pancreatic cancer; Lindsay Salazar had been seeking chemotherapy with the goal of surgical resection/cure, but due to her repeat hospitalizations, she is unable to complete her schedule courses of chemotherapy. At this point, she is incurable. We talk about that she may not be able to take chemo dt her weakness.   Recommendations/Plan: Ms Siemen declines rehab in a facility for a few weeks, she is considering her options for the future, in the past she has declined placement. At this point, continue to treat the treatable.  Continue with chemotherapy every other week as tolerated and if offered.  Code status discussions today, encouraged to consider choice.  Goals of Care and Additional Recommendations:  Limitations on Scope of Treatment: Full Scope Treatment  Code Status:    Code Status Orders        Start     Ordered   08/15/16 1828  Full code  Continuous     08/15/16 1832    Code Status History    Date Active Date Inactive Code Status Order ID Comments User Context   08/09/2016  3:54 PM 08/14/2016  6:54 PM Full Code [id number]  Orson Eva, MD Inpatient   07/21/2016  7:53 PM 07/22/2016  4:51 PM DNR [id number]  Karmen Bongo, MD Inpatient   06/08/2016  7:01 PM 06/10/2016  8:01 PM Full Code [id number]  Karmen Bongo, MD Inpatient   06/03/2016 12:44 PM 06/06/2016  7:55 PM Full Code [id number]  Elgergawy, Silver Huguenin, MD Inpatient    05/26/2016  5:58 PM 06/02/2016  9:02 PM Full Code [id number]  Kathie Dike, MD Inpatient   05/09/2016  2:06 AM 05/12/2016  8:27 PM Full Code [id number]  Oswald Hillock, MD Inpatient   04/26/2016  5:36 PM 05/01/2016 10:05 PM Full Code [id number]  Kathie Dike, MD Inpatient   03/28/2016  7:38 PM 04/04/2016  6:22 PM Full Code [id number]  Waldemar Dickens, MD ED   02/29/2016  4:17 PM 03/02/2016  8:16 PM Full Code [id number]  Damita Lack, MD ED   02/17/2016 10:27 PM 02/19/2016  2:01 PM Full Code [id number]  Karmen Bongo, MD Inpatient   01/28/2016  6:45 AM 02/06/2016 12:03 AM Full Code [id number]  Reubin Milan, MD Inpatient   01/21/2016  1:18 AM 01/23/2016  6:45 PM Full Code [id number]  Oswald Hillock, MD Inpatient   11/27/2015  8:55 PM 12/03/2015  6:34 PM Full Code [id number]  Karmen Bongo, MD Inpatient   08/08/2015  8:53 PM 08/12/2015  9:27 PM Full Code [id number]  Karmen Bongo, MD ED   07/17/2015  7:46 AM 07/19/2015  5:27 PM Full Code [id number]  Jani Gravel, MD Inpatient  06/27/2015  4:40 PM 06/30/2015  5:26 PM Full Code [id number]  Louellen Molder, MD Inpatient       Prognosis:   Unable to determine based on outcomes. 6 months or less would not be surprising based on burden of pancreatic adenocarcinoma, CHF, COPD exacerbations with 11 hospitalizations in the last 6 months.  Discharge Planning:  Home with [place] was discussed with nursing staff, case manager, social worker, and Dr. Rockne Menghini.   Thank you for allowing the Palliative Medicine Team to assist in the care of this patient.   Time In: 1330 Time Out: 1415 Total Time 45 minutes Prolonged Time Billed  no       Greater than 50%  of this time was spent counseling and coordinating care related to the above assessment and plan.  Drue Novel, NP  Please contact Palliative Medicine Team phone at [phone] for questions and concerns.

## 2016-09-02 ENCOUNTER — Emergency Department (HOSPITAL_COMMUNITY)
Admission: EM | Admit: 2016-09-02 | Discharge: 2016-09-02 | Disposition: A | Discharge status: Home / Self Care | Payer: Medicare HMO | Attending: Emergency Medicine | Admitting: Emergency Medicine

## 2016-09-02 ENCOUNTER — Emergency Department (HOSPITAL_COMMUNITY): Payer: Medicare HMO

## 2016-09-02 ENCOUNTER — Other Ambulatory Visit: Payer: Self-pay

## 2016-09-02 ENCOUNTER — Encounter (HOSPITAL_COMMUNITY): Payer: Self-pay

## 2016-09-02 DIAGNOSIS — R42 Dizziness and giddiness: Secondary | ICD-10-CM | POA: Diagnosis not present

## 2016-09-02 DIAGNOSIS — Z79899 Other long term (current) drug therapy: Secondary | ICD-10-CM | POA: Insufficient documentation

## 2016-09-02 DIAGNOSIS — Z87891 Personal history of nicotine dependence: Secondary | ICD-10-CM | POA: Insufficient documentation

## 2016-09-02 DIAGNOSIS — Z8507 Personal history of malignant neoplasm of pancreas: Secondary | ICD-10-CM | POA: Diagnosis not present

## 2016-09-02 DIAGNOSIS — I129 Hypertensive chronic kidney disease with stage 1 through stage 4 chronic kidney disease, or unspecified chronic kidney disease: Secondary | ICD-10-CM | POA: Insufficient documentation

## 2016-09-02 DIAGNOSIS — J441 Chronic obstructive pulmonary disease with (acute) exacerbation: Secondary | ICD-10-CM

## 2016-09-02 DIAGNOSIS — N183 Chronic kidney disease, stage 3 (moderate): Secondary | ICD-10-CM | POA: Diagnosis not present

## 2016-09-02 DIAGNOSIS — J45909 Unspecified asthma, uncomplicated: Secondary | ICD-10-CM | POA: Insufficient documentation

## 2016-09-02 DIAGNOSIS — R079 Chest pain, unspecified: Secondary | ICD-10-CM | POA: Diagnosis present

## 2016-09-02 LAB — BASIC METABOLIC PANEL
ANION GAP: 9 (ref 5–15)
BUN: 27 mg/dL — ABNORMAL HIGH (ref 6–20)
CHLORIDE: 101 mmol/L (ref 101–111)
CO2: 28 mmol/L (ref 22–32)
CREATININE: 0.76 mg/dL (ref 0.44–1.00)
Calcium: 9 mg/dL (ref 8.9–10.3)
GFR calc non Af Amer: >60 mL/min (ref >60)
Glucose, Bld: 125 mg/dL — ABNORMAL HIGH (ref 65–99)
Potassium: 4.3 mmol/L (ref 3.5–5.1)
Sodium: 138 mmol/L (ref 135–145)

## 2016-09-02 LAB — CBC WITH DIFFERENTIAL/PLATELET
Basophils Absolute: 0 10*3/uL (ref 0.0–0.1)
Basophils Relative: 0 %
Eosinophils Absolute: 0 10*3/uL (ref 0.0–0.7)
Eosinophils Relative: 0 %
HEMATOCRIT: 29.7 % — AB (ref 36.0–46.0)
HEMOGLOBIN: 9.2 g/dL — AB (ref 12.0–15.0)
LYMPHS ABS: 0.6 10*3/uL — AB (ref 0.7–4.0)
Lymphocytes Relative: 8 %
MCH: 28.9 pg (ref 26.0–34.0)
MCHC: 31 g/dL (ref 30.0–36.0)
MCV: 93.4 fL (ref 78.0–100.0)
MONOS PCT: 3 %
Monocytes Absolute: 0.2 10*3/uL (ref 0.1–1.0)
NEUTROS ABS: 7.2 10*3/uL (ref 1.7–7.7)
NEUTROS PCT: 89 %
Platelets: 191 10*3/uL (ref 150–400)
RBC: 3.18 MIL/uL — ABNORMAL LOW (ref 3.87–5.11)
RDW: 15.8 % — ABNORMAL HIGH (ref 11.5–15.5)
WBC: 8 10*3/uL (ref 4.0–10.5)

## 2016-09-02 LAB — TROPONIN I: Troponin I: <0.03 ng/mL (ref <0.03)

## 2016-09-02 MED ORDER — ALBUTEROL SULFATE (2.5 MG/3ML) 0.083% IN NEBU
5.0000 mg | INHALATION_SOLUTION | Freq: Once | RESPIRATORY_TRACT | Status: AC
  Administered 2016-09-02: 5 mg via RESPIRATORY_TRACT
  Filled 2016-09-02: qty 6

## 2016-09-02 MED ORDER — MECLIZINE HCL 25 MG PO TABS: 25.0000 mg | ORAL_TABLET | Freq: Three times a day (TID) | ORAL | 0 refills | Status: AC | PRN

## 2016-09-02 MED ORDER — OXYCODONE-ACETAMINOPHEN 5-325 MG PO TABS
1.0000 | ORAL_TABLET | Freq: Once | ORAL | Status: AC
  Administered 2016-09-02: 1 via ORAL
  Filled 2016-09-02: qty 1

## 2016-09-02 MED ORDER — METHOCARBAMOL 500 MG PO TABS
500.0000 mg | ORAL_TABLET | Freq: Once | ORAL | Status: AC
  Administered 2016-09-02: 500 mg via ORAL
  Filled 2016-09-02: qty 1

## 2016-09-02 MED ORDER — PREDNISONE 20 MG PO TABS: 40.0000 mg | ORAL_TABLET | Freq: Every day | ORAL | 0 refills | Status: DC

## 2016-09-02 MED ORDER — DEXAMETHASONE SODIUM PHOSPHATE 4 MG/ML IJ SOLN: 10.0000 mg | Freq: Once | INTRAMUSCULAR | Status: DC

## 2016-09-02 MED ORDER — DEXAMETHASONE SODIUM PHOSPHATE 4 MG/ML IJ SOLN
10.0000 mg | Freq: Once | INTRAMUSCULAR | Status: AC
  Administered 2016-09-02: 10 mg via INTRAMUSCULAR
  Filled 2016-09-02: qty 3

## 2016-09-02 NOTE — Discharge Instructions (Signed)
Please take the meds prescribed for the vertigo and for the COPD.  Please return to the ER if your symptoms worsen; you have increased pain, shortness of breath, fevers, chills, inability to keep any medications down, confusion, falls. Otherwise see the outpatient doctor as requested.

## 2016-09-02 NOTE — ED Provider Notes (Addendum)
[facility] DEPT Provider Note   CSN: [id number] Arrival date & time: 09/02/16  1120     History   Chief Complaint Chief Complaint  Patient presents with  . Chest Pain    HPI Lindsay Salazar is a 66 y.o. female.  HPI 66 y.o.femalewith a PMH of pancreatic adenocarcinoma not on chemotherapy anymore, COPD/chronic respiratory failure with 2 L O2 and recent admission for pneumonia and chronic atrial fibrillation on Xarelto, chronic diastolic CHF, stage III CKD who comes in with multiple complains. Pt reports that her main complain is shortness of breath. She has bad respiratory status at baseline, but currently her dib is worse than usual. She notes that walking 5 feet will get her short of breath, normally she will walk a bit more than that. Pt has a persistent cough with cream, thick productive phlegm. Pt is still on antibiotics for her pneumonia. Pt has chest pain when she cough. She also has anterior chest pain that has been intermittently present without cough and is dull in nature. That pain is not provoked by exertion and lasts for 2-3 min. Pt also reports that she has been having dizziness. Dizziness is described as spinning sensation and it is intermittent and lasts for 5 min. The vertigo started this morning. She has had episodes of this  Dizziness in the past. Pt has no associated focal numbness, weakness, vision changes.   Past Medical History:  Diagnosis Date  . Anemia of chronic disease   . Arthritis    bursitis , fibromyalgia  . Arthritis    "right hip" (11/27/2015)  . Asthma   . Chronic bronchitis ([place])   . Chronic respiratory failure ([place])   . Chronic right hip pain   . COPD (chronic obstructive pulmonary disease) ([place])    wears home o2 prn  . Daily headache    "last 2-3 months" (11/27/2015)  . DVT (deep venous thrombosis) ([place]) 03/2016  . Exposure to TB    "mom had it when she was pregnant with me"  . Fibromyalgia   . GERD (gastroesophageal reflux disease)      use to have it but not now  . Heart murmur    72-  79 years old  . Hyperkalemia   . Hypertension   . On home oxygen therapy    11/27/2015 "whenever I need it; don't know how many liters"  . PAF (paroxysmal atrial fibrillation) ([place])   . Pancreatic cancer ([place])   . Paroxysmal atrial flutter ([place])   . Pneumonia    "several times" (11/27/2015)  . Pneumothorax     Patient Active Problem List   Diagnosis Date Noted  . Poor social situation 08/22/2016  . Palliative care encounter   . Goals of care, counseling/discussion   . Hypoxia   . AKI (acute kidney injury) ([place])   . 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 Impaired glucose tolerance 08/10/2016  . Acute renal failure superimposed on stage 3 chronic kidney disease ([place]) 08/10/2016  . Obstructive chronic bronchitis with exacerbation ([place])   . Impaired glucose tolerance test   . Acute chest pain   . Low vitamin D level 08/07/2016  . CKD (chronic kidney disease) stage 3, GFR 30-59 ml/min 07/21/2016  . Malnutrition of moderate degree 06/04/2016  . Cellulitis of left foot 05/26/2016  . Acute exacerbation of chronic obstructive pulmonary disease (COPD) ([place]) 05/09/2016  . Cellulitis 04/26/2016  . Chronic respiratory failure  with hypoxia ([place]) 04/26/2016  . Hypoxemia 03/30/2016  . Acute on chronic respiratory failure with hypoxia ([place]) 03/29/2016  . Leukocytosis 03/29/2016  . Anemia of chronic disease 03/29/2016  . Essential hypertension 03/29/2016  . HCAP (healthcare-associated pneumonia) 03/28/2016  . B12 deficiency 01/24/2016  . COPD with acute exacerbation ([place]) 01/20/2016  . Pancreatic cancer ([place]) 12/07/2015  . Chronic anticoagulation-Xarelto 09/06/2015  . Gastrointestinal hemorrhage with melena 08/08/2015  . Atrial fibrillation ([place]) 07/17/2015  . Atrial fibrillation with RVR ([place]) 06/27/2015    Past Surgical History:  Procedure  Laterality Date  . ANTERIOR CERVICAL DECOMP/DISCECTOMY FUSION     "put 4 screws in"  . CARDIAC CATHETERIZATION  08/2004   Archie Endo 06/18/2010  . CARDIOVERSION N/A 07/18/2015   Procedure: CARDIOVERSION;  Surgeon: Josue Hector, MD;  Location: [facility] ENDO [facility]ITE;  Service: Cardiovascular;  Laterality: N/A;  . ESOPHAGOGASTRODUODENOSCOPY N/A 08/09/2015   Procedure: ESOPHAGOGASTRODUODENOSCOPY (EGD);  Surgeon: Rogene Houston, MD;  Location: [facility] ENDO SUITE;  Service: Endoscopy;  Laterality: N/A;  . EUS N/A 11/29/2015   Procedure: UPPER ENDOSCOPIC ULTRASOUND (EUS) RADIAL;  Surgeon: Milus Banister, MD;  Location: [facility] ENDOSCOPY;  Service: Endoscopy;  Laterality: N/A;  . IR GENERIC HISTORICAL  12/18/2015   IR US GUIDE VASC ACCESS RIGHT 12/18/2015 Sandi Mariscal, MD [facility]  . IR GENERIC HISTORICAL  12/18/2015   IR FLUORO GUIDE PORT INSERTION RIGHT 12/18/2015 Sandi Mariscal, MD [facility]  . TONSILLECTOMY    . TUBAL LIGATION      OB History    Gravida Para Term Preterm AB Living   2         2   SAB TAB Ectopic Multiple Live Births                   Home Medications    Prior to Admission medications   Medication Sig Start Date End Date Taking? Authorizing Provider  COMBIVENT RESPIMAT 20-100 MCG/ACT AERS respimat Inhale 1 puff into the lungs every 6 (six) hours as needed for wheezing. 08/14/16  Yes Emokpae, Ejiroghene E, MD  diltiazem (CARDIZEM CD) 240 MG 24 hr capsule Take 1 capsule (240 mg total) by mouth daily. 05/23/16 09/02/16 Yes Lendon Colonel, NP  docusate sodium (COLACE) 100 MG capsule Take 1 capsule (100 mg total) by mouth 2 (two) times daily. 07/22/16  Yes Isaac Bliss, Rayford Halsted, MD  ergocalciferol (VITAMIN D2) 50000 units capsule Take 1 capsule (50,000 Units total) by mouth once a week. 08/07/16  Yes Kefalas, Manon Hilding, PA-C  escitalopram (LEXAPRO) 10 MG tablet Take 1 tablet by mouth daily. 08/04/16  Yes [provider]  furosemide (LASIX) 40 MG tablet Take 1 tablet (40 mg total)  by mouth daily. 08/16/16  Yes Emokpae, Ejiroghene E, MD  HYDROcodone-acetaminophen (NORCO/VICODIN) 5-325 MG tablet Take 1 tablet by mouth every 4 (four) hours as needed. 08/22/16  Yes Twana First, MD  ipratropium-albuterol (DUONEB) 0.5-2.5 (3) MG/3ML SOLN Inhale 3 mLs into the lungs every 6 (six) hours as needed (for shortness of breath).  03/03/16  Yes [provider]  LORazepam (ATIVAN) 0.5 MG tablet Take 1 tablet (0.5 mg total) by mouth every 6 (six) hours as needed for anxiety. 08/14/16  Yes Emokpae, Ejiroghene E, MD  metoprolol tartrate (LOPRESSOR) 50 MG tablet Take 1 tablet (50 mg total) by mouth 2 (two) times daily. 03/02/16  Yes Kathie Dike, MD  OXYGEN Inhale 2 L into the lungs daily as needed (for breathing).   Yes [provider]  pantoprazole (PROTONIX) 40 MG tablet Take 1 tablet (40 mg total) by mouth daily. 12/03/15  Yes Rai, Ripudeep K, MD  potassium chloride SA (K-DUR,KLOR-CON) 20 MEQ tablet Take 1 tablet (20 mEq total) by mouth daily. 08/16/16  Yes Emokpae, Ejiroghene E, MD  SYMBICORT 160-4.5 MCG/ACT inhaler Inhale 1 puff into the lungs 2 (two) times daily. 12/03/15  Yes Rai, Ripudeep K, MD  temazepam (RESTORIL) 15 MG capsule Take 15 mg by mouth at bedtime.   Yes [provider]  XARELTO 20 MG TABS tablet Take 1 tablet by mouth daily. 08/31/16  Yes [provider]  feeding supplement, ENSURE ENLIVE, (ENSURE ENLIVE) LIQD Take 237 mLs by mouth 2 (two) times daily between meals. Patient not taking: Reported on 09/02/2016 08/22/16   Rama, Venetia Maxon, MD  guaiFENesin (MUCINEX) 600 MG 12 hr tablet Take 2 tablets (1,200 mg total) by mouth 2 (two) times daily. 08/14/16   Emokpae, Ejiroghene E, MD  meclizine (ANTIVERT) 25 MG tablet Take 1 tablet (25 mg total) by mouth 3 (three) times daily as needed for dizziness. 09/02/16   Varney Biles, MD  predniSONE (DELTASONE) 20 MG tablet Take 2 tablets (40 mg total) by mouth daily with breakfast. 09/02/16   Varney Biles, MD    Family History Family History  Problem Relation Age of Onset  . COPD Mother   . Diabetes Father   . Stroke Father     Social History Social History  Substance Use Topics  . Smoking status: Former Smoker    Packs/day: 0.50    Years: 46.00    Types: Cigarettes    Start date: 10/09/1969    Quit date: 06/02/2015  . Smokeless tobacco: Never Used  . Alcohol use No     Comment: quit 30 years ago     Allergies   Ibuprofen; Other; Penicillins; Tiotropium bromide monohydrate; and Tramadol   Review of Systems Review of Systems  Constitutional: Positive for activity change.  Respiratory: Positive for cough and shortness of breath.   Cardiovascular: Positive for chest pain.  Gastrointestinal: Negative for abdominal pain, nausea and vomiting.  Genitourinary: Negative for dysuria.  Musculoskeletal: Negative for neck pain.  Neurological: Positive for dizziness and weakness. Negative for syncope, numbness and headaches.  All other systems reviewed and are negative.    Physical Exam Updated Vital Signs BP 133/85   Pulse 83   Temp 98.4 F (36.9 C) (Oral)   Resp 17   Ht 5\' 4"  (1.626 m)   Wt 59.9 kg (132 lb)   SpO2 100%   BMI 22.66 kg/m   Physical Exam  Constitutional: She is oriented to person, place, and time. She appears well-developed and well-nourished.  HENT:  Head: Normocephalic and atraumatic.  Eyes: Pupils are equal, round, and reactive to light. EOM are normal.  Neck: Neck supple.  Cardiovascular: Normal rate.   Pulmonary/Chest: Effort normal. No respiratory distress. She has wheezes.  Abdominal: Soft. She exhibits no distension. There is no tenderness.  Neurological: She is alert and oriented to person, place, and time. No cranial nerve deficit. Coordination normal.  Skin: Skin is warm and dry.  Nursing note and vitals reviewed.    ED Treatments / Results  Labs (all labs ordered are listed, but only abnormal results are displayed) Labs  Reviewed  BASIC METABOLIC PANEL - Abnormal; Notable for the following:       Result Value   Glucose, Bld 125 (*)    BUN 27 (*)    All other  components within normal limits  CBC WITH DIFFERENTIAL/PLATELET - Abnormal; Notable for the following:    RBC 3.18 (*)    Hemoglobin 9.2 (*)    HCT 29.7 (*)    RDW 15.8 (*)    Lymphs Abs 0.6 (*)    All other components within normal limits  TROPONIN I  URINALYSIS, ROUTINE W REFLEX MICROSCOPIC    EKG  EKG Interpretation  Date/Time:  Tuesday September 02 2016 11:26:55 EDT Ventricular Rate:  89 PR Interval:    QRS Duration: 91 QT Interval:  379 QTC Calculation: 462 R Axis:   65 Text Interpretation:  Atrial fibrillation Low voltage, extremity leads Minimal ST depression, lateral leads No acute changes No significant change since last tracing Confirmed by Varney Biles [phone]) on 09/02/2016 12:13:02 PM       Radiology Dg Chest 2 View  Result Date: 09/02/2016 CLINICAL DATA:  Intermittent chest pain, shortness of breath and nausea beginning at 3 a.m. today. EXAM: CHEST  2 VIEW COMPARISON:  Single-view of the chest 08/21/2016. PA and lateral chest 08/15/2016 and 02/07/2016. CT chest 05/08/2016. FINDINGS: The lungs are severely emphysematous. Left basilar airspace disease seen on the most recent examination has improved with persistent opacity medially in the left base identified. The right lung is now clear. Heart size is normal. Aortic atherosclerosis is noted. IMPRESSION: Persistent but improved left lower lobe airspace disease. Small left effusion is not notably changed. Marked emphysema. Atherosclerosis. Electronically Signed   By: Inge Rise M.D.   On: 09/02/2016 12:56   Ct Head Wo Contrast  Result Date: 09/02/2016 CLINICAL DATA:  66 year old hypertensive female fell off couch well sleeping. Having dizzy spells. Initial encounter. EXAM: CT HEAD WITHOUT CONTRAST TECHNIQUE: Contiguous axial images were obtained from the base of the skull  through the vertex without intravenous contrast. COMPARISON:  None. FINDINGS: Brain: No intracranial hemorrhage or CT evidence of large acute infarct. Prominent chronic microvascular changes. Global atrophy. Ventricular prominence probably related to central atrophy rather hydrocephalus. No intracranial mass lesion noted on this unenhanced exam. Vascular: Vascular calcifications. Skull: No skull fracture. Sinuses/Orbits: No acute orbital abnormality. Visualized paranasal sinuses are clear. Other: Mastoid air cells and middle ear cavities are clear. IMPRESSION: No intracranial hemorrhage or skull fracture. Chronic microvascular changes. Atrophy. Electronically Signed   By: Genia Del M.D.   On: 09/02/2016 12:57    Procedures Procedures (including critical care time)  Medications Ordered in ED Medications  methocarbamol (ROBAXIN) tablet 500 mg (500 mg Oral Given 09/02/16 1257)  oxyCODONE-acetaminophen (PERCOCET/ROXICET) 5-325 MG per tablet 1 tablet (1 tablet Oral Given 09/02/16 1257)  albuterol (PROVENTIL) (2.5 MG/3ML) 0.083% nebulizer solution 5 mg (5 mg Nebulization Given 09/02/16 1306)  dexamethasone (DECADRON) injection 10 mg (10 mg Intramuscular Given 09/02/16 1450)     Initial Impression / Assessment and Plan / ED Course  I have reviewed the triage vital signs and the nursing notes.  Pertinent labs & imaging results that were available during my care of the patient were reviewed by me and considered in my medical decision making (see chart for details).  Clinical Course as of Sep 03 1611  Tue Sep 02, 2016  1600 .  [AN]  1605 Repeat exam reveals clearing of wheezing in all lung fields. Patient is not in any respiratory distress nor is there hypoxia.  She feels better. Pt was given iv decadron here, I dont think she needs long term steroids. She has quit smoking, she has no new cough or phlegm - so  no need for antibiotics.  CXR shows improved opacity. Labs are reassuring.  Strict ER  return precautions have been discussed, and patient is agreeing with the plan and is comfortable with the workup done and the recommendations from the ER.  [AN]  1608 Dizziness has resolved.  [AN]    Clinical Course User Index [AN] Varney Biles, MD    Pt comes in with multiple complains. Pt's main complains is DIB. She has some chest pain that are not typical of ACS. Troponin ordered. EKG is reassuring. Pt has advanced COPD, and her dib appears to be due to CHF or COPD as she has fine wheezing. Pt also has cough and productive phlegm - CXR ordered. Neither are new, as she is on  Antibiotics for pneumonia - the phlegm color is now clear/ cream from dark green - so I suspect the pneumonia is getting better. Pt has frail lungs at baseline. We will tx her with albuterol and reassess..  Dizziness - intermittent and reproducible with movement. Likely BPPV.   Final Clinical Impressions(s) / ED Diagnoses   Final diagnoses:  COPD exacerbation (HCC)  Vertigo    New Prescriptions New Prescriptions   MECLIZINE (ANTIVERT) 25 MG TABLET    Take 1 tablet (25 mg total) by mouth 3 (three) times daily as needed for dizziness.     Varney Biles, MD 09/02/16 [id number]    Varney Biles, MD 09/11/16 1308

## 2016-09-02 NOTE — ED Notes (Signed)
ED Provider at bedside. 

## 2016-09-02 NOTE — ED Triage Notes (Signed)
Pt c/o intermittent chest pain since 3 am.  Also reports "wet" cough.  Reports SOB and nausea, no vomiting.

## 2016-09-03 ENCOUNTER — Ambulatory Visit (HOSPITAL_COMMUNITY): Payer: Medicare HMO

## 2016-09-04 ENCOUNTER — Ambulatory Visit (HOSPITAL_COMMUNITY): Payer: Medicare HMO | Admitting: Oncology

## 2016-09-12 ENCOUNTER — Encounter (HOSPITAL_COMMUNITY): Payer: Self-pay

## 2016-09-12 ENCOUNTER — Encounter (HOSPITAL_COMMUNITY): Payer: Medicare HMO | Attending: Hematology & Oncology

## 2016-09-12 DIAGNOSIS — Z452 Encounter for adjustment and management of vascular access device: Secondary | ICD-10-CM | POA: Diagnosis not present

## 2016-09-12 DIAGNOSIS — C251 Malignant neoplasm of body of pancreas: Secondary | ICD-10-CM

## 2016-09-12 MED ORDER — SODIUM CHLORIDE 0.9% FLUSH
10.0000 mL | INTRAVENOUS | Status: DC | PRN
  Administered 2016-09-12: 10 mL via INTRAVENOUS
  Filled 2016-09-12: qty 10

## 2016-09-12 MED ORDER — HEPARIN SOD (PORK) LOCK FLUSH 100 UNIT/ML IV SOLN
500.0000 [IU] | Freq: Once | INTRAVENOUS | Status: AC
  Administered 2016-09-12: 500 [IU] via INTRAVENOUS

## 2016-09-12 NOTE — Progress Notes (Signed)
Pt requesting refill of hydrocodone for her hip and lower extremity pain - discussed with Mike Craze, NP.  Since pt is noncompliant with tx and showing for appts, it is decided we will no longer fill her pain medication, and that going forward she will need to have her pain medication filled by Dr. Luna Glasgow (who wrote her pain medication prior to her cancer dx), or by her PCP.  Pt informed of this and verbalizes understanding.  [name] presented for Portacath access and flush.  Proper placement of portacath confirmed by CXR.  Portacath located right chest wall accessed with  H 20 needle.  Good blood return present. Portacath flushed with 92ml NS and 500U/71ml Heparin and needle removed intact.  Procedure tolerated well and without incident.  Discharged via wheelchair in c/o friend.

## 2016-09-16 ENCOUNTER — Other Ambulatory Visit (HOSPITAL_COMMUNITY): Payer: Self-pay | Admitting: General Surgery

## 2016-09-16 ENCOUNTER — Ambulatory Visit (HOSPITAL_COMMUNITY)
Admission: RE | Admit: 2016-09-16 | Discharge: 2016-09-16 | Disposition: A | Discharge status: Home / Self Care | Payer: Medicare HMO | Source: Ambulatory Visit | Attending: General Surgery | Admitting: General Surgery

## 2016-09-16 DIAGNOSIS — R609 Edema, unspecified: Secondary | ICD-10-CM

## 2016-09-16 DIAGNOSIS — M25571 Pain in right ankle and joints of right foot: Secondary | ICD-10-CM | POA: Diagnosis not present

## 2016-09-16 DIAGNOSIS — M79671 Pain in right foot: Secondary | ICD-10-CM | POA: Diagnosis not present

## 2016-09-16 DIAGNOSIS — R52 Pain, unspecified: Secondary | ICD-10-CM

## 2016-09-16 DIAGNOSIS — M7989 Other specified soft tissue disorders: Secondary | ICD-10-CM | POA: Insufficient documentation

## 2016-09-16 NOTE — Progress Notes (Signed)
Please let patient know foot and ankle xrays are negative for fracture.

## 2016-09-28 ENCOUNTER — Other Ambulatory Visit: Payer: Self-pay | Admitting: Cardiology

## 2016-10-15 ENCOUNTER — Other Ambulatory Visit (HOSPITAL_COMMUNITY): Payer: Self-pay | Admitting: Respiratory Therapy

## 2016-10-15 DIAGNOSIS — J441 Chronic obstructive pulmonary disease with (acute) exacerbation: Secondary | ICD-10-CM

## 2016-10-22 ENCOUNTER — Ambulatory Visit (HOSPITAL_COMMUNITY)
Admission: RE | Admit: 2016-10-22 | Discharge: 2016-10-22 | Disposition: A | Discharge status: Home / Self Care | Payer: Medicare HMO | Source: Ambulatory Visit | Attending: Pulmonary Disease | Admitting: Pulmonary Disease

## 2016-10-22 DIAGNOSIS — J441 Chronic obstructive pulmonary disease with (acute) exacerbation: Secondary | ICD-10-CM | POA: Insufficient documentation

## 2016-10-22 MED ORDER — ALBUTEROL SULFATE (2.5 MG/3ML) 0.083% IN NEBU
2.5000 mg | INHALATION_SOLUTION | Freq: Once | RESPIRATORY_TRACT | Status: AC
  Administered 2016-10-22: 2.5 mg via RESPIRATORY_TRACT

## 2016-10-23 LAB — PULMONARY FUNCTION TEST
DL/VA % pred: 45 %
DL/VA: 2.18 ml/min/mmHg/L
DLCO COR % PRED: 32 %
DLCO UNC: 7.94 ml/min/mmHg
DLCO cor: 7.94 ml/min/mmHg
DLCO unc % pred: 32 %
FEF 25-75 POST: 0.72 L/s
FEF 25-75 Pre: 0.51 L/sec
FEF2575-%Change-Post: 42 %
FEF2575-%Pred-Post: 36 %
FEF2575-%Pred-Pre: 25 %
FEV1-%CHANGE-POST: 12 %
FEV1-%PRED-POST: 47 %
FEV1-%Pred-Pre: 42 %
FEV1-PRE: 0.99 L
FEV1-Post: 1.11 L
FEV1FVC-%Change-Post: -5 %
FEV1FVC-%PRED-PRE: 76 %
FEV6-%Change-Post: 18 %
FEV6-%PRED-POST: 68 %
FEV6-%Pred-Pre: 57 %
FEV6-POST: 2.01 L
FEV6-Pre: 1.69 L
FEV6FVC-%CHANGE-POST: 0 %
FEV6FVC-%PRED-POST: 103 %
FEV6FVC-%Pred-Pre: 102 %
FVC-%CHANGE-POST: 18 %
FVC-%Pred-Post: 66 %
FVC-%Pred-Pre: 56 %
FVC-Post: 2.02 L
FVC-Pre: 1.71 L
POST FEV6/FVC RATIO: 99 %
PRE FEV6/FVC RATIO: 99 %
Post FEV1/FVC ratio: 55 %
Pre FEV1/FVC ratio: 58 %
RV % PRED: 158 %
RV: 3.42 L
TLC % PRED: 97 %
TLC: 4.94 L

## 2016-10-29 ENCOUNTER — Other Ambulatory Visit (HOSPITAL_COMMUNITY): Payer: Self-pay

## 2016-10-29 DIAGNOSIS — R7989 Other specified abnormal findings of blood chemistry: Secondary | ICD-10-CM

## 2016-10-29 MED ORDER — ERGOCALCIFEROL 1.25 MG (50000 UT) PO CAPS: 50000.0000 [IU] | ORAL_CAPSULE | ORAL | 0 refills | Status: AC

## 2016-10-29 NOTE — Telephone Encounter (Signed)
Received refill request from patients pharmacy for Vitamin D. Reviewed with provider, chart checked and refilled.

## 2016-10-30 ENCOUNTER — Other Ambulatory Visit (HOSPITAL_COMMUNITY)
Admission: RE | Admit: 2016-10-30 | Discharge: 2016-10-30 | Disposition: A | Discharge status: Home / Self Care | Payer: Medicare HMO | Source: Ambulatory Visit | Attending: General Surgery | Admitting: General Surgery

## 2016-10-30 DIAGNOSIS — R7989 Other specified abnormal findings of blood chemistry: Secondary | ICD-10-CM

## 2016-10-30 DIAGNOSIS — C259 Malignant neoplasm of pancreas, unspecified: Secondary | ICD-10-CM | POA: Diagnosis present

## 2016-10-30 LAB — CBC WITH DIFFERENTIAL/PLATELET

## 2016-10-31 LAB — CANCER ANTIGEN 19-9: CAN 19-9: 369 U/mL — AB (ref 0–35)

## 2016-11-11 ENCOUNTER — Encounter (HOSPITAL_COMMUNITY): Payer: Self-pay | Admitting: *Deleted

## 2016-11-11 ENCOUNTER — Inpatient Hospital Stay (HOSPITAL_COMMUNITY)
Admission: EM | Admit: 2016-11-11 | Discharge: 2016-11-15 | DRG: 291 | Disposition: A | Discharge status: Home / Self Care | Payer: Medicare HMO | Attending: Internal Medicine | Admitting: Internal Medicine

## 2016-11-11 ENCOUNTER — Emergency Department (HOSPITAL_COMMUNITY): Payer: Medicare HMO

## 2016-11-11 DIAGNOSIS — Z7952 Long term (current) use of systemic steroids: Secondary | ICD-10-CM

## 2016-11-11 DIAGNOSIS — G8929 Other chronic pain: Secondary | ICD-10-CM | POA: Diagnosis present

## 2016-11-11 DIAGNOSIS — T502X5A Adverse effect of carbonic-anhydrase inhibitors, benzothiadiazides and other diuretics, initial encounter: Secondary | ICD-10-CM | POA: Diagnosis not present

## 2016-11-11 DIAGNOSIS — I480 Paroxysmal atrial fibrillation: Secondary | ICD-10-CM | POA: Diagnosis present

## 2016-11-11 DIAGNOSIS — I5031 Acute diastolic (congestive) heart failure: Secondary | ICD-10-CM | POA: Diagnosis present

## 2016-11-11 DIAGNOSIS — N202 Calculus of kidney with calculus of ureter: Secondary | ICD-10-CM | POA: Diagnosis present

## 2016-11-11 DIAGNOSIS — I130 Hypertensive heart and chronic kidney disease with heart failure and stage 1 through stage 4 chronic kidney disease, or unspecified chronic kidney disease: Principal | ICD-10-CM | POA: Diagnosis present

## 2016-11-11 DIAGNOSIS — I4891 Unspecified atrial fibrillation: Secondary | ICD-10-CM | POA: Diagnosis not present

## 2016-11-11 DIAGNOSIS — Z885 Allergy status to narcotic agent status: Secondary | ICD-10-CM

## 2016-11-11 DIAGNOSIS — E876 Hypokalemia: Secondary | ICD-10-CM | POA: Diagnosis not present

## 2016-11-11 DIAGNOSIS — N183 Chronic kidney disease, stage 3 unspecified: Secondary | ICD-10-CM | POA: Diagnosis present

## 2016-11-11 DIAGNOSIS — I5021 Acute systolic (congestive) heart failure: Secondary | ICD-10-CM

## 2016-11-11 DIAGNOSIS — Z87891 Personal history of nicotine dependence: Secondary | ICD-10-CM | POA: Diagnosis not present

## 2016-11-11 DIAGNOSIS — R0602 Shortness of breath: Secondary | ICD-10-CM | POA: Diagnosis present

## 2016-11-11 DIAGNOSIS — L680 Hirsutism: Secondary | ICD-10-CM | POA: Diagnosis present

## 2016-11-11 DIAGNOSIS — E8770 Fluid overload, unspecified: Secondary | ICD-10-CM | POA: Diagnosis present

## 2016-11-11 DIAGNOSIS — Z981 Arthrodesis status: Secondary | ICD-10-CM

## 2016-11-11 DIAGNOSIS — C259 Malignant neoplasm of pancreas, unspecified: Secondary | ICD-10-CM | POA: Diagnosis present

## 2016-11-11 DIAGNOSIS — M25551 Pain in right hip: Secondary | ICD-10-CM | POA: Diagnosis present

## 2016-11-11 DIAGNOSIS — R52 Pain, unspecified: Secondary | ICD-10-CM | POA: Diagnosis present

## 2016-11-11 DIAGNOSIS — N209 Urinary calculus, unspecified: Secondary | ICD-10-CM | POA: Diagnosis present

## 2016-11-11 DIAGNOSIS — N201 Calculus of ureter: Secondary | ICD-10-CM

## 2016-11-11 DIAGNOSIS — J9621 Acute and chronic respiratory failure with hypoxia: Secondary | ICD-10-CM | POA: Diagnosis present

## 2016-11-11 DIAGNOSIS — J9611 Chronic respiratory failure with hypoxia: Secondary | ICD-10-CM | POA: Diagnosis present

## 2016-11-11 DIAGNOSIS — I361 Nonrheumatic tricuspid (valve) insufficiency: Secondary | ICD-10-CM | POA: Diagnosis not present

## 2016-11-11 DIAGNOSIS — D638 Anemia in other chronic diseases classified elsewhere: Secondary | ICD-10-CM | POA: Diagnosis present

## 2016-11-11 DIAGNOSIS — I482 Chronic atrial fibrillation: Secondary | ICD-10-CM | POA: Diagnosis present

## 2016-11-11 DIAGNOSIS — Z7901 Long term (current) use of anticoagulants: Secondary | ICD-10-CM | POA: Diagnosis not present

## 2016-11-11 DIAGNOSIS — D62 Acute posthemorrhagic anemia: Secondary | ICD-10-CM | POA: Diagnosis present

## 2016-11-11 DIAGNOSIS — J441 Chronic obstructive pulmonary disease with (acute) exacerbation: Secondary | ICD-10-CM

## 2016-11-11 DIAGNOSIS — Z7951 Long term (current) use of inhaled steroids: Secondary | ICD-10-CM | POA: Diagnosis not present

## 2016-11-11 DIAGNOSIS — Z9981 Dependence on supplemental oxygen: Secondary | ICD-10-CM | POA: Diagnosis not present

## 2016-11-11 DIAGNOSIS — Z880 Allergy status to penicillin: Secondary | ICD-10-CM

## 2016-11-11 DIAGNOSIS — N170 Acute kidney failure with tubular necrosis: Secondary | ICD-10-CM | POA: Diagnosis not present

## 2016-11-11 DIAGNOSIS — I2609 Other pulmonary embolism with acute cor pulmonale: Secondary | ICD-10-CM | POA: Diagnosis present

## 2016-11-11 DIAGNOSIS — R739 Hyperglycemia, unspecified: Secondary | ICD-10-CM | POA: Diagnosis present

## 2016-11-11 DIAGNOSIS — M797 Fibromyalgia: Secondary | ICD-10-CM | POA: Diagnosis present

## 2016-11-11 DIAGNOSIS — Z888 Allergy status to other drugs, medicaments and biological substances status: Secondary | ICD-10-CM

## 2016-11-11 DIAGNOSIS — E871 Hypo-osmolality and hyponatremia: Secondary | ICD-10-CM | POA: Diagnosis not present

## 2016-11-11 DIAGNOSIS — E860 Dehydration: Secondary | ICD-10-CM | POA: Diagnosis not present

## 2016-11-11 DIAGNOSIS — I10 Essential (primary) hypertension: Secondary | ICD-10-CM | POA: Diagnosis present

## 2016-11-11 DIAGNOSIS — Z825 Family history of asthma and other chronic lower respiratory diseases: Secondary | ICD-10-CM

## 2016-11-11 DIAGNOSIS — K219 Gastro-esophageal reflux disease without esophagitis: Secondary | ICD-10-CM | POA: Diagnosis present

## 2016-11-11 DIAGNOSIS — N179 Acute kidney failure, unspecified: Secondary | ICD-10-CM | POA: Diagnosis not present

## 2016-11-11 DIAGNOSIS — T380X5A Adverse effect of glucocorticoids and synthetic analogues, initial encounter: Secondary | ICD-10-CM | POA: Diagnosis present

## 2016-11-11 DIAGNOSIS — Z9851 Tubal ligation status: Secondary | ICD-10-CM

## 2016-11-11 LAB — CBC WITH DIFFERENTIAL/PLATELET
BASOS ABS: 0 10*3/uL (ref 0.0–0.1)
BASOS PCT: 0 %
Eosinophils Absolute: 0.1 10*3/uL (ref 0.0–0.7)
Eosinophils Relative: 1 %
HEMATOCRIT: 24.8 % — AB (ref 36.0–46.0)
HEMOGLOBIN: 7.5 g/dL — AB (ref 12.0–15.0)
LYMPHS PCT: 18 %
Lymphs Abs: 1.7 10*3/uL (ref 0.7–4.0)
MCH: 25.3 pg — ABNORMAL LOW (ref 26.0–34.0)
MCHC: 30.2 g/dL (ref 30.0–36.0)
MCV: 83.8 fL (ref 78.0–100.0)
Monocytes Absolute: 0.7 10*3/uL (ref 0.1–1.0)
Monocytes Relative: 8 %
NEUTROS ABS: 6.9 10*3/uL (ref 1.7–7.7)
NEUTROS PCT: 73 %
Platelets: 253 10*3/uL (ref 150–400)
RBC: 2.96 MIL/uL — AB (ref 3.87–5.11)
RDW: 17.9 % — ABNORMAL HIGH (ref 11.5–15.5)
WBC: 9.4 10*3/uL (ref 4.0–10.5)

## 2016-11-11 LAB — GLUCOSE, CAPILLARY
GLUCOSE-CAPILLARY: 117 mg/dL — AB (ref 65–99)
GLUCOSE-CAPILLARY: 208 mg/dL — AB (ref 65–99)
GLUCOSE-CAPILLARY: 218 mg/dL — AB (ref 65–99)
Glucose-Capillary: 247 mg/dL — ABNORMAL HIGH (ref 65–99)

## 2016-11-11 LAB — URINALYSIS, ROUTINE W REFLEX MICROSCOPIC
BACTERIA UA: NONE SEEN
BILIRUBIN URINE: NEGATIVE
GLUCOSE, UA: NEGATIVE mg/dL
Ketones, ur: NEGATIVE mg/dL
NITRITE: NEGATIVE
PROTEIN: NEGATIVE mg/dL
SPECIFIC GRAVITY, URINE: 1.014 (ref 1.005–1.030)
pH: 5 (ref 5.0–8.0)

## 2016-11-11 LAB — COMPREHENSIVE METABOLIC PANEL
ALK PHOS: 83 U/L (ref 38–126)
ALT: 11 U/L — AB (ref 14–54)
ANION GAP: 8 (ref 5–15)
AST: 17 U/L (ref 15–41)
Albumin: 3 g/dL — ABNORMAL LOW (ref 3.5–5.0)
BILIRUBIN TOTAL: 0.4 mg/dL (ref 0.3–1.2)
BUN: 19 mg/dL (ref 6–20)
CO2: 23 mmol/L (ref 22–32)
Calcium: 8.8 mg/dL — ABNORMAL LOW (ref 8.9–10.3)
Chloride: 108 mmol/L (ref 101–111)
Creatinine, Ser: 1.16 mg/dL — ABNORMAL HIGH (ref 0.44–1.00)
GFR, EST AFRICAN AMERICAN: 55 mL/min — AB (ref >60)
GFR, EST NON AFRICAN AMERICAN: 47 mL/min — AB (ref >60)
GLUCOSE: 106 mg/dL — AB (ref 65–99)
POTASSIUM: 4.2 mmol/L (ref 3.5–5.1)
Sodium: 139 mmol/L (ref 135–145)
TOTAL PROTEIN: 6.3 g/dL — AB (ref 6.5–8.1)

## 2016-11-11 LAB — POC OCCULT BLOOD, ED: Fecal Occult Bld: NEGATIVE

## 2016-11-11 LAB — BASIC METABOLIC PANEL
ANION GAP: 13 (ref 5–15)
BUN: 19 mg/dL (ref 6–20)
CHLORIDE: 102 mmol/L (ref 101–111)
CO2: 22 mmol/L (ref 22–32)
Calcium: 8.7 mg/dL — ABNORMAL LOW (ref 8.9–10.3)
Creatinine, Ser: 1.35 mg/dL — ABNORMAL HIGH (ref 0.44–1.00)
GFR calc Af Amer: 46 mL/min — ABNORMAL LOW (ref >60)
GFR, EST NON AFRICAN AMERICAN: 39 mL/min — AB (ref >60)
GLUCOSE: 121 mg/dL — AB (ref 65–99)
POTASSIUM: 4.1 mmol/L (ref 3.5–5.1)
Sodium: 137 mmol/L (ref 135–145)

## 2016-11-11 LAB — LIPASE, BLOOD: Lipase: 23 U/L (ref 11–51)

## 2016-11-11 LAB — HEMOGLOBIN AND HEMATOCRIT, BLOOD
HCT: 26.3 % — ABNORMAL LOW (ref 36.0–46.0)
Hemoglobin: 7.9 g/dL — ABNORMAL LOW (ref 12.0–15.0)

## 2016-11-11 LAB — MAGNESIUM: Magnesium: 1.6 mg/dL — ABNORMAL LOW (ref 1.7–2.4)

## 2016-11-11 LAB — PHOSPHORUS: PHOSPHORUS: 3.2 mg/dL (ref 2.5–4.6)

## 2016-11-11 LAB — BRAIN NATRIURETIC PEPTIDE: B NATRIURETIC PEPTIDE 5: 1319 pg/mL — AB (ref 0.0–100.0)

## 2016-11-11 MED ORDER — LORAZEPAM 0.5 MG PO TABS
0.5000 mg | ORAL_TABLET | Freq: Four times a day (QID) | ORAL | Status: DC | PRN
  Administered 2016-11-11 – 2016-11-15 (×7): 0.5 mg via ORAL
  Filled 2016-11-11 (×7): qty 1

## 2016-11-11 MED ORDER — INSULIN ASPART 100 UNIT/ML ~~LOC~~ SOLN
0.0000 [IU] | Freq: Three times a day (TID) | SUBCUTANEOUS | Status: DC
  Administered 2016-11-11 (×2): 5 [IU] via SUBCUTANEOUS
  Administered 2016-11-12 – 2016-11-13 (×5): 3 [IU] via SUBCUTANEOUS
  Administered 2016-11-14: 5 [IU] via SUBCUTANEOUS
  Administered 2016-11-14 (×2): 3 [IU] via SUBCUTANEOUS
  Administered 2016-11-15: 2 [IU] via SUBCUTANEOUS
  Administered 2016-11-15 (×2): 5 [IU] via SUBCUTANEOUS

## 2016-11-11 MED ORDER — IPRATROPIUM BROMIDE 0.02 % IN SOLN
0.5000 mg | Freq: Four times a day (QID) | RESPIRATORY_TRACT | Status: DC
  Administered 2016-11-11 – 2016-11-13 (×10): 0.5 mg via RESPIRATORY_TRACT
  Filled 2016-11-11 (×10): qty 2.5

## 2016-11-11 MED ORDER — FUROSEMIDE 10 MG/ML IJ SOLN: 40.0000 mg | Freq: Two times a day (BID) | INTRAMUSCULAR | Status: DC

## 2016-11-11 MED ORDER — ORAL CARE MOUTH RINSE
15.0000 mL | Freq: Two times a day (BID) | OROMUCOSAL | Status: DC
  Administered 2016-11-11 – 2016-11-14 (×5): 15 mL via OROMUCOSAL

## 2016-11-11 MED ORDER — MAGNESIUM SULFATE 2 GM/50ML IV SOLN
2.0000 g | Freq: Once | INTRAVENOUS | Status: AC
  Administered 2016-11-11: 2 g via INTRAVENOUS
  Filled 2016-11-11: qty 50

## 2016-11-11 MED ORDER — DOCUSATE SODIUM 100 MG PO CAPS
100.0000 mg | ORAL_CAPSULE | Freq: Two times a day (BID) | ORAL | Status: DC
  Administered 2016-11-11 – 2016-11-15 (×9): 100 mg via ORAL
  Filled 2016-11-11 (×9): qty 1

## 2016-11-11 MED ORDER — ALBUTEROL SULFATE (2.5 MG/3ML) 0.083% IN NEBU
INHALATION_SOLUTION | RESPIRATORY_TRACT | Status: AC
  Administered 2016-11-11: 2.5 mg via RESPIRATORY_TRACT
  Filled 2016-11-11: qty 3

## 2016-11-11 MED ORDER — MECLIZINE HCL 12.5 MG PO TABS
25.0000 mg | ORAL_TABLET | Freq: Three times a day (TID) | ORAL | Status: DC | PRN
  Filled 2016-11-11: qty 2

## 2016-11-11 MED ORDER — LEVALBUTEROL HCL 1.25 MG/0.5ML IN NEBU
1.2500 mg | INHALATION_SOLUTION | Freq: Four times a day (QID) | RESPIRATORY_TRACT | Status: DC
  Administered 2016-11-11 – 2016-11-13 (×10): 1.25 mg via RESPIRATORY_TRACT
  Filled 2016-11-11 (×10): qty 0.5

## 2016-11-11 MED ORDER — FUROSEMIDE 10 MG/ML IJ SOLN
40.0000 mg | Freq: Once | INTRAMUSCULAR | Status: AC
  Administered 2016-11-11: 40 mg via INTRAVENOUS
  Filled 2016-11-11: qty 4

## 2016-11-11 MED ORDER — METHYLPREDNISOLONE SODIUM SUCC 125 MG IJ SOLR
125.0000 mg | Freq: Once | INTRAMUSCULAR | Status: AC
  Administered 2016-11-11: 125 mg via INTRAVENOUS
  Filled 2016-11-11: qty 2

## 2016-11-11 MED ORDER — ESCITALOPRAM OXALATE 10 MG PO TABS
10.0000 mg | ORAL_TABLET | Freq: Every day | ORAL | Status: DC
  Administered 2016-11-11 – 2016-11-15 (×5): 10 mg via ORAL
  Filled 2016-11-11 (×5): qty 1

## 2016-11-11 MED ORDER — ALUM & MAG HYDROXIDE-SIMETH 200-200-20 MG/5ML PO SUSP
30.0000 mL | ORAL | Status: DC | PRN
  Administered 2016-11-11: 30 mL via ORAL
  Filled 2016-11-11: qty 30

## 2016-11-11 MED ORDER — PANTOPRAZOLE SODIUM 40 MG PO TBEC
40.0000 mg | DELAYED_RELEASE_TABLET | Freq: Every day | ORAL | Status: DC
  Administered 2016-11-11 – 2016-11-15 (×5): 40 mg via ORAL
  Filled 2016-11-11 (×4): qty 1

## 2016-11-11 MED ORDER — ALBUTEROL SULFATE (2.5 MG/3ML) 0.083% IN NEBU
2.5000 mg | INHALATION_SOLUTION | RESPIRATORY_TRACT | Status: DC | PRN
  Administered 2016-11-11: 2.5 mg via RESPIRATORY_TRACT

## 2016-11-11 MED ORDER — METHYLPREDNISOLONE SODIUM SUCC 40 MG IJ SOLR
40.0000 mg | Freq: Four times a day (QID) | INTRAMUSCULAR | Status: DC
  Administered 2016-11-11 – 2016-11-12 (×4): 40 mg via INTRAVENOUS
  Filled 2016-11-11 (×4): qty 1

## 2016-11-11 MED ORDER — TEMAZEPAM 15 MG PO CAPS
15.0000 mg | ORAL_CAPSULE | Freq: Every evening | ORAL | Status: DC | PRN
  Administered 2016-11-12 (×2): 15 mg via ORAL
  Filled 2016-11-11 (×2): qty 1

## 2016-11-11 MED ORDER — RIVAROXABAN 20 MG PO TABS
20.0000 mg | ORAL_TABLET | Freq: Every day | ORAL | Status: DC
  Administered 2016-11-11 – 2016-11-15 (×5): 20 mg via ORAL
  Filled 2016-11-11 (×6): qty 1

## 2016-11-11 MED ORDER — FENTANYL CITRATE (PF) 100 MCG/2ML IJ SOLN
25.0000 ug | Freq: Once | INTRAMUSCULAR | Status: AC
  Administered 2016-11-11: 25 ug via INTRAVENOUS
  Filled 2016-11-11: qty 2

## 2016-11-11 MED ORDER — FUROSEMIDE 10 MG/ML IJ SOLN
40.0000 mg | Freq: Two times a day (BID) | INTRAMUSCULAR | Status: DC
  Administered 2016-11-11 – 2016-11-12 (×3): 40 mg via INTRAVENOUS
  Filled 2016-11-11 (×3): qty 4

## 2016-11-11 MED ORDER — FENTANYL CITRATE (PF) 100 MCG/2ML IJ SOLN
50.0000 ug | Freq: Once | INTRAMUSCULAR | Status: AC
  Administered 2016-11-11: 50 ug via INTRAVENOUS
  Filled 2016-11-11: qty 2

## 2016-11-11 MED ORDER — ALBUTEROL SULFATE (2.5 MG/3ML) 0.083% IN NEBU
5.0000 mg | INHALATION_SOLUTION | Freq: Once | RESPIRATORY_TRACT | Status: AC
  Administered 2016-11-11: 5 mg via RESPIRATORY_TRACT
  Filled 2016-11-11: qty 6

## 2016-11-11 MED ORDER — POTASSIUM CHLORIDE CRYS ER 20 MEQ PO TBCR
20.0000 meq | EXTENDED_RELEASE_TABLET | Freq: Every day | ORAL | Status: DC
  Administered 2016-11-11 – 2016-11-14 (×4): 20 meq via ORAL
  Filled 2016-11-11 (×3): qty 1

## 2016-11-11 MED ORDER — METOPROLOL TARTRATE 50 MG PO TABS
50.0000 mg | ORAL_TABLET | Freq: Two times a day (BID) | ORAL | Status: DC
  Administered 2016-11-11 – 2016-11-15 (×8): 50 mg via ORAL
  Filled 2016-11-11 (×9): qty 1

## 2016-11-11 MED ORDER — DILTIAZEM HCL ER COATED BEADS 240 MG PO CP24
240.0000 mg | ORAL_CAPSULE | Freq: Every day | ORAL | Status: DC
  Administered 2016-11-11 – 2016-11-15 (×5): 240 mg via ORAL
  Filled 2016-11-11 (×5): qty 1

## 2016-11-11 MED ORDER — GUAIFENESIN ER 600 MG PO TB12
1200.0000 mg | ORAL_TABLET | Freq: Two times a day (BID) | ORAL | Status: DC
  Administered 2016-11-11 – 2016-11-15 (×9): 1200 mg via ORAL
  Filled 2016-11-11 (×9): qty 2

## 2016-11-11 MED ORDER — METOPROLOL TARTRATE 5 MG/5ML IV SOLN
2.5000 mg | Freq: Once | INTRAVENOUS | Status: AC
  Administered 2016-11-11: 2.5 mg via INTRAVENOUS
  Filled 2016-11-11: qty 5

## 2016-11-11 MED ORDER — HYDROCODONE-ACETAMINOPHEN 5-325 MG PO TABS
1.0000 | ORAL_TABLET | ORAL | Status: DC | PRN
  Administered 2016-11-11 (×2): 1 via ORAL
  Filled 2016-11-11 (×2): qty 1

## 2016-11-11 NOTE — ED Notes (Signed)
OccultBlood: NEGATIVE

## 2016-11-11 NOTE — ED Provider Notes (Signed)
[facility] DEPT Provider Note   CSN: [id number] Arrival date & time: 11/11/16  0010     History   Chief Complaint Chief Complaint  Patient presents with  . Shortness of Breath    HPI CLIO GERHART is a 66 y.o. female.  The history is provided by the patient.  Abdominal Pain   This is a new problem. The current episode started 6 to 12 hours ago. The problem occurs constantly. The problem has been gradually worsening. The pain is located in the RLQ. The pain is moderate. Pertinent negatives include fever and vomiting. The symptoms are aggravated by palpation. Nothing relieves the symptoms.  Patient with h/o multiple chronic illnesses including pancreatic CA, COPD on home oxygen [facility]), atrial fibrillation presents with RLQ abd pain for past several hours It seem to be relieved with bowel movements No fever/vomiting  She also reports increasing cough/SOB likely due to her severe COPD She no longer smokes   Past Medical History:  Diagnosis Date  . Anemia of chronic disease   . Arthritis    bursitis , fibromyalgia  . Arthritis    "right hip" (11/27/2015)  . Asthma   . Chronic bronchitis ([place])   . Chronic respiratory failure ([place])   . Chronic right hip pain   . COPD (chronic obstructive pulmonary disease) ([place])    wears home o2 prn  . Daily headache    "last 2-3 months" (11/27/2015)  . DVT (deep venous thrombosis) ([place]) 03/2016  . Exposure to TB    "mom had it when she was pregnant with me"  . Fibromyalgia   . GERD (gastroesophageal reflux disease)    use to have it but not now  . Heart murmur    54-  8 years old  . Hyperkalemia   . Hypertension   . On home oxygen therapy    11/27/2015 "whenever I need it; don't know how many liters"  . PAF (paroxysmal atrial fibrillation) ([place])   . Pancreatic cancer ([place])   . Paroxysmal atrial flutter ([place])   . Pneumonia    "several times" (11/27/2015)  . Pneumothorax     Patient Active Problem List   Diagnosis Date Noted    . Poor social situation 08/22/2016  . Palliative care encounter   . Goals of care, counseling/discussion   . Hypoxia   . AKI (acute kidney injury) ([place])   . Acute renal failure with acute renal cortical necrosis superimposed on stage 3 chronic kidney disease ([place]) 08/10/2016  . Impaired glucose tolerance 08/10/2016  . Acute renal failure superimposed on stage 3 chronic kidney disease ([place]) 08/10/2016  . Obstructive chronic bronchitis with exacerbation ([place])   . Impaired glucose tolerance test   . Acute chest pain   . Low vitamin D level 08/07/2016  . CKD (chronic kidney disease) stage 3, GFR 30-59 ml/min (HCC) 07/21/2016  . Malnutrition of moderate degree 06/04/2016  . Cellulitis of left foot 05/26/2016  . Acute exacerbation of chronic obstructive pulmonary disease (COPD) ([place]) 05/09/2016  . Cellulitis 04/26/2016  . Chronic respiratory failure with hypoxia ([place]) 04/26/2016  . Hypoxemia 03/30/2016  . Acute on chronic respiratory failure with hypoxia ([place]) 03/29/2016  . Leukocytosis 03/29/2016  . Anemia of chronic disease 03/29/2016  . Essential hypertension 03/29/2016  . HCAP (healthcare-associated pneumonia) 03/28/2016  . B12 deficiency 01/24/2016  . COPD with acute exacerbation ([place]) 01/20/2016  . Pancreatic cancer ([place]) 12/07/2015  . Chronic anticoagulation-Xarelto 09/06/2015  . Gastrointestinal hemorrhage with melena 08/08/2015  . Atrial  fibrillation ([place]) 07/17/2015  . Atrial fibrillation with RVR ([place]) 06/27/2015    Past Surgical History:  Procedure Laterality Date  . ANTERIOR CERVICAL DECOMP/DISCECTOMY FUSION     "put 4 screws in"  . CARDIAC CATHETERIZATION  08/2004   Archie Endo 06/18/2010  . CARDIOVERSION N/A 07/18/2015   Procedure: CARDIOVERSION;  Surgeon: Josue Hector, MD;  Location[facility] ENDO SU[facility];  Service: Cardiovascular;  Laterality: N/A;  . ESOPHAGOGASTRODUODENOSCOPY N/A 08/09/2015   Procedure: ESOPHAGOGASTRODUODENOSCOPY (EGD);  Surgeon: Rogene Houston, MD;   Location: [facility] ENDO SUITE;  Service: Endoscopy;  Laterality: N/A;  . EUS N/A 11/29/2015   Procedure: UPPER ENDOSCOPIC ULTRASOUND (EUS) RADIAL;  Surgeon: Milus Banister, MD;  Location: [facility] ENDOSCOPY;  Service: Endoscopy;  Laterality: N/A;  . IR GENERIC HISTORICAL  12/18/2015   IR US GUIDE VASC ACCESS RIGHT 12/18/2015 Sandi Mariscal, MD [facility]-INTER[facility] RAD  . IR GENERIC HISTORICAL  12/18/2015   IR FLUORO GUIDE PORT INSERTION RIGHT 12/18/2015 Sandi Mariscal, MD [facility]-INTER[facility]  . TONSILLECTOMY    . TUBAL LIGATION      OB History    Gravida Para Term Preterm AB Living   2         2   SAB TAB Ectopic Multiple Live Births                   Home Medications    Prior to Admission medications   Medication Sig Start Date End Date Taking? Authorizing Provider  COMBIVENT RESPIMAT 20-100 MCG/ACT AERS respimat Inhale 1 puff into the lungs every 6 (six) hours as needed for wheezing. 08/14/16   Emokpae, Ejiroghene E, MD  diltiazem (CARDIZEM CD) 240 MG 24 hr capsule Take 1 capsule (240 mg total) by mouth daily. 05/23/16 09/02/16  Lendon Colonel, NP  docusate sodium (COLACE) 100 MG capsule Take 1 capsule (100 mg total) by mouth 2 (two) times daily. 07/22/16   Isaac Bliss, Rayford Halsted, MD  ergocalciferol (VITAMIN D2) 50000 units capsule Take 1 capsule (50,000 Units total) by mouth once a week. 10/29/16   Twana First, MD  escitalopram (LEXAPRO) 10 MG tablet Take 1 tablet by mouth daily. 08/04/16   [provider]  feeding supplement, ENSURE ENLIVE, (ENSURE ENLIVE) LIQD Take 237 mLs by mouth 2 (two) times daily between meals. Patient not taking: Reported on 09/02/2016 08/22/16   Rama, Venetia Maxon, MD  furosemide (LASIX) 40 MG tablet Take 1 tablet (40 mg total) by mouth daily. 08/16/16   Emokpae, Ejiroghene E, MD  guaiFENesin (MUCINEX) 600 MG 12 hr tablet Take 2 tablets (1,200 mg total) by mouth 2 (two) times daily. 08/14/16   Emokpae, Ejiroghene E, MD  HYDROcodone-acetaminophen (NORCO/VICODIN) 5-325 MG tablet Take 1  tablet by mouth every 4 (four) hours as needed. 08/22/16   Twana First, MD  ipratropium-albuterol (DUONEB) 0.5-2.5 (3) MG/3ML SOLN Inhale 3 mLs into the lungs every 6 (six) hours as needed (for shortness of breath).  03/03/16   [provider]  LORazepam (ATIVAN) 0.5 MG tablet Take 1 tablet (0.5 mg total) by mouth every 6 (six) hours as needed for anxiety. 08/14/16   Emokpae, Ejiroghene E, MD  meclizine (ANTIVERT) 25 MG tablet Take 1 tablet (25 mg total) by mouth 3 (three) times daily as needed for dizziness. 09/02/16   Varney Biles, MD  metoprolol tartrate (LOPRESSOR) 50 MG tablet Take 1 tablet (50 mg total) by mouth 2 (two) times daily. 03/02/16   Kathie Dike, MD  OXYGEN Inhale 2 L into the lungs  daily as needed (for breathing).    [provider]  pantoprazole (PROTONIX) 40 MG tablet Take 1 tablet (40 mg total) by mouth daily. 12/03/15   Rai, Ripudeep K, MD  potassium chloride SA (K-DUR,KLOR-CON) 20 MEQ tablet Take 1 tablet (20 mEq total) by mouth daily. 08/16/16   Emokpae, Ejiroghene E, MD  predniSONE (DELTASONE) 20 MG tablet Take 2 tablets (40 mg total) by mouth daily with breakfast. 09/02/16   Varney Biles, MD  SYMBICORT 160-4.5 MCG/ACT inhaler Inhale 1 puff into the lungs 2 (two) times daily. 12/03/15   Rai, Ripudeep K, MD  temazepam (RESTORIL) 15 MG capsule Take 15 mg by mouth at bedtime.    [provider]  XARELTO 20 MG TABS tablet Take 1 tablet by mouth daily. 08/31/16   [provider]    Family History Family History  Problem Relation Age of Onset  . COPD Mother   . Diabetes Father   . Stroke Father     Social History Social History  Substance Use Topics  . Smoking status: Former Smoker    Packs/day: 0.50    Years: 46.00    Types: Cigarettes    Start date: 10/09/1969    Quit date: 06/02/2015  . Smokeless tobacco: Never Used  . Alcohol use No     Comment: quit 30 years ago     Allergies   Ibuprofen; Other; Penicillins; Tiotropium  bromide monohydrate; and Tramadol   Review of Systems Review of Systems  Constitutional: Negative for fever.  Respiratory: Positive for cough and shortness of breath.   Cardiovascular: Positive for chest pain.  Gastrointestinal: Positive for abdominal pain. Negative for blood in stool and vomiting.  All other systems reviewed and are negative.    Physical Exam Updated Vital Signs BP (!) 141/68 (BP Location: Left Arm)   Pulse 82   Temp 98.2 F (36.8 C) (Oral)   Resp 20   Ht 1.626 m (5\' 4" )   Wt 63.5 kg (140 lb)   SpO2 (!) 82% Comment: placed on 2L [place], O2 now at 93%  BMI 24.03 kg/m   Physical Exam CONSTITUTIONAL: Disheveled, elderly, no distress HEAD: Normocephalic/atraumatic EYES: EOMI/PERRL ENMT: Mucous membranes moist, hirsutism NECK: supple no meningeal signs SPINE/BACK:entire spine nontender CV: irregular, no loud murmurs LUNGS: mild tachypnea, decreased BS noted bilaterally ABDOMEN: soft, moderate RLQ tenderness, no rebound or guarding, bowel sounds noted throughout abdomen GU:no cva tenderness NEURO: Pt is awake/alert/appropriate, moves all extremitiesx4.  No facial droop.   EXTREMITIES: pulses normal/equal, full ROM, minimal edema to bilateral LE SKIN: warm, color normal PSYCH: no abnormalities of mood noted, alert and oriented to situation   ED Treatments / Results  Labs (all labs ordered are listed, but only abnormal results are displayed) Labs Reviewed  COMPREHENSIVE METABOLIC PANEL - Abnormal; Notable for the following:       Result Value   Glucose, Bld 106 (*)    Creatinine, Ser 1.16 (*)    Calcium 8.8 (*)    Total Protein 6.3 (*)    Albumin 3.0 (*)    ALT 11 (*)    GFR calc non Af Amer 47 (*)    GFR calc Af Amer 55 (*)    All other components within normal limits  CBC WITH DIFFERENTIAL/PLATELET - Abnormal; Notable for the following:    RBC 2.96 (*)    Hemoglobin 7.5 (*)    HCT 24.8 (*)    MCH 25.3 (*)    RDW 17.9 (*)  All other components  within normal limits  URINALYSIS, ROUTINE W REFLEX MICROSCOPIC - Abnormal; Notable for the following:    Hgb urine dipstick LARGE (*)    Leukocytes, UA SMALL (*)    Squamous Epithelial / LPF 0-5 (*)    All other components within normal limits  BRAIN NATRIURETIC PEPTIDE - Abnormal; Notable for the following:    B Natriuretic Peptide 1,319.0 (*)    All other components within normal limits  LIPASE, BLOOD  POC OCCULT BLOOD, ED  TYPE AND SCREEN    EKG  EKG Interpretation  Date/Time:  Tuesday November 11 2016 01:02:13 EDT Ventricular Rate:  108 PR Interval:    QRS Duration: 76 QT Interval:  380 QTC Calculation: 493 R Axis:   39 Text Interpretation:  Atrial fibrillation Low voltage, extremity leads Probable anteroseptal infarct, old Minimal ST depression, inferior leads Interpretation limited secondary to artifact Confirmed by Ripley Fraise [phone]) on 11/11/2016 1:06:56 AM       Radiology Dg Chest Portable 1 View  Result Date: 11/11/2016 CLINICAL DATA:  Shortness of breath, cough, RIGHT lower abdominal pain, history COPD, respiratory failure, bronchitis, hypertension, paroxysmal atrial fibrillation, former smoker EXAM: PORTABLE CHEST 1 VIEW COMPARISON:  Portable exam 0023 hours compared to 09/02/2016 FINDINGS: RIGHT jugular Port-A-Cath with tip projecting over cavoatrial junction. Upper normal size of cardiac silhouette with pulmonary vascular congestion. Atherosclerotic calcification aorta. Emphysematous changes with interstitial prominence increased since previous exam question minimal edema or infection. No gross pleural effusion or pneumothorax. Bones demineralized. IMPRESSION: Pulmonary vascular congestion with increased interstitial prominence since the previous exam question mild pulmonary edema or infection. Underlying emphysematous changes. Electronically Signed   By: Lavonia Dana M.D.   On: 11/11/2016 00:55   Ct Renal Stone Study  Result Date: 11/11/2016 CLINICAL DATA:  Acute  onset of left flank pain. Hematuria. Initial encounter. EXAM: CT ABDOMEN AND PELVIS WITHOUT CONTRAST TECHNIQUE: Multidetector CT imaging of the abdomen and pelvis was performed following the standard protocol without IV contrast. COMPARISON:  CT of the abdomen performed 07/31/2016, and renal ultrasound performed 08/11/2016 FINDINGS: Lower chest: Small bilateral pleural effusions are noted. Mild bibasilar interstitial prominence may reflect interstitial edema. The visualized portions of the mediastinum are unremarkable. Hepatobiliary: A large 6.9 cm cyst is noted at the left hepatic lobe. The liver is otherwise grossly unremarkable. The gallbladder is within normal limits. The common bile duct remains normal in caliber. Pancreas: The known mass at the body of the pancreas may have increased in size, perhaps measuring 3.7 cm in length, though not well assessed without contrast. There is underlying diffuse dilatation of the pancreatic duct at the pancreatic tail. Spleen: The spleen is unremarkable in appearance. Adrenals/Urinary Tract: The adrenal glands are unremarkable in appearance. Nonobstructing right renal stones measure up to 3 mm in size. Nonspecific perinephric stranding is seen bilaterally. A small right renal cyst is noted. There is no evidence of hydronephrosis. No renal or ureteral stones are identified. There is asymmetric prominence of the right ureter along its entire course, with surrounding soft tissue inflammation. A 3 mm stone is noted at the base of the bladder. This likely reflects a recently passed stone. Stomach/Bowel: The stomach is unremarkable in appearance. The small bowel is within normal limits. The appendix is normal in caliber, without evidence of appendicitis. The colon is unremarkable in appearance. Vascular/Lymphatic: Diffuse calcification is seen along the abdominal aorta and its branches. The abdominal aorta is otherwise grossly unremarkable. The inferior vena cava is grossly  unremarkable.  No retroperitoneal lymphadenopathy is seen. No pelvic sidewall lymphadenopathy is identified. Reproductive: The bladder is mildly distended and otherwise unremarkable. The uterus is unremarkable in appearance. Trace free fluid is noted within the pelvis. No suspicious adnexal masses are seen. The ovaries are grossly symmetric. Other: A small umbilical hernia is noted, containing only fat. Musculoskeletal: No acute osseous abnormalities are identified. Vacuum phenomenon is noted at L5-S1. The visualized musculature is unremarkable in appearance. IMPRESSION: 1. Asymmetric prominence of the right ureter along its entire course, with surrounding soft tissue inflammation. 3 mm stone at the base of the bladder. This likely reflects a recently passed stone. No evidence of hydronephrosis at this time. 2. Nonobstructing right renal stones measure up to 3 mm in size. Small right renal cyst noted. 3. Small bilateral pleural effusions. Mild bibasilar interstitial prominence may reflect interstitial edema. 4. Known mass at the body of the pancreas may have increased in size since the prior study, now measuring 3.7 cm, though not well characterized without contrast. Underlying diffuse dilatation of the pancreatic duct at the pancreatic tail. 5. Large hepatic cyst again noted. 6. Diffuse aortic atherosclerosis. 7. Small umbilical hernia, containing only fat. Electronically Signed   By: Garald Balding M.D.   On: 11/11/2016 03:07    Procedures Procedures  Medications Ordered in ED Medications  furosemide (LASIX) injection 40 mg (not administered)  albuterol (PROVENTIL) (2.5 MG/3ML) 0.083% nebulizer solution 5 mg (5 mg Nebulization Given 11/11/16 0130)  fentaNYL (SUBLIMAZE) injection 50 mcg (50 mcg Intravenous Given 11/11/16 0049)  fentaNYL (SUBLIMAZE) injection 25 mcg (25 mcg Intravenous Given 11/11/16 0214)     Initial Impression / Assessment and Plan / ED Course  I have reviewed the triage vital signs and  the nursing notes.  Pertinent labs & imaging results that were available during my care of the patient were reviewed by me and considered in my medical decision making (see chart for details).     1:04 AM Pt in the ED for RLQ abdominal pain Will start with labs/urinalysis She will likely need CT imaging Will also treat her COPD, though she feels this might be improving Per records, h/o pancreatic CA but not on current CHEMO 1:32 AM Pt with anemia noted, but no signs of acute GI bleed, hemoccult negative per nursing Pt with hematuria, and known stone disease CT renal ordered 3:31 AM Pt with just passed ureteral stone No appendicitis  She does reports increase dyspnea on exertion lately She has evidence of CHF and also worsening anemia Will admit D/w dr Olevia Bowens for admission Final Clinical Impressions(s) / ED Diagnoses   Final diagnoses:  Right ureteral stone  Acute blood loss anemia  Acute systolic congestive heart failure ([place])  COPD exacerbation ([place])    New Prescriptions New Prescriptions   No medications on file     Ripley Fraise, MD 11/11/16 [phone]

## 2016-11-11 NOTE — ED Triage Notes (Signed)
Pt brought in by [facility] for c/o abdominal pain and sob; pt was given 2.5mg  albuterol en route by ems; pt has non-productive cough and some audible wheezing noted; pt c/o left lower abdominal pain;

## 2016-11-11 NOTE — Progress Notes (Signed)
Initial Nutrition Assessment  DOCUMENTATION CODES:      INTERVENTION:  Heart Healthy Cho Modified diet/ Limit fluid intake (1.2 liters daily)   NUTRITION DIAGNOSIS:   (Unintentional wt gain) related to acute illness as evidenced by  (18% above usual body wt) and per MD note - (fluid overload).    GOAL:   (Patient will improve fluid status while maintaining adequate oral intake)   MONITOR:  PO intake, I & O's, Weight trends, Labs  REASON FOR ASSESSMENT:   Consult Assessment of nutrition requirement/status  ASSESSMENT:  Lindsay Salazar is a 66 yo female who has been admitted with fluid overload and has a has a hx of COPD. Presents with abdominal pain and shortness of breath. Fecal occult blood negative, CT- small PE and previously known pancreatic mass.  Her appetite is excellent- 100% of breakfast consumed. The patient says I rally like you all's food. Feeds herself and able to shop and prepare her own food. She complains this morning that "they" sent their food stamps for this month to victims of [place] and don't have much food at home. She is fluid overloaded but has not impacted her desire to eat. She is on a fluid restriction and nutrition services are aware.  Her weight is up ~ 20 lbs above usual reported wt of 120 lb (54.5 kg). She is receiving diuretic therapy and on daily weights.  Nutrition-Focused physical exam completed. Findings are no fat depletion, mild temporal and clavicle muscle depletion, and mild right foot edema. Dry, discolored skin bilateral arms.    Recent Labs Lab 11/11/16 0043  NA 139  K 4.2  CL 108  CO2 23  BUN 19  CREATININE 1.16*  CALCIUM 8.8*  MG 1.6*  PHOS 3.2  GLUCOSE 106*   Labs: cr 1.16, Mag 1.6  Meds: lasix, potassium  Diet Order:  Diet heart healthy/carb modified Room service appropriate? Yes; Fluid consistency: Thin; Fluid restriction: 1200 mL Fluid  Skin:   (eccymosis multiple areas upper and lower extremities)  Last BM:   10/8  Height:   Ht Readings from Last 1 Encounters:  11/11/16 5\' 4"  (1.626 m)    Weight:   Wt Readings from Last 1 Encounters:  11/11/16 140 lb 1.6 oz (63.5 kg)    Ideal Body Weight:  55 kg  BMI:  Body mass index is 24.05 kg/m.  Estimated Nutritional Needs:   Kcal:  1600-1782 (30-33 kcal/kg) (based on dry wt of 54 kg-)  Protein:  50-55 gr  Fluid:  1200 ml daily per MD  EDUCATION NEEDS:   Education needs addressed Colman Cater Lindsay,RD,CSG,LDN Office: [phone] Pager: [phone]

## 2016-11-11 NOTE — Progress Notes (Signed)
Informed Patient that I spoke with Dr. Darrick Meigs regarding her hiding the PRN Hydrocodone in her hand and pretending that she took it.  I made her aware that Dr. Darrick Meigs D/C'd the PRN Hydrocodone 5-325.  She then replied (with Ronny Bacon, Nurse Tech in room), "I don't give a Damn if he discontinued them.Marland KitchenMarland KitchenMarland KitchenYou want to know why? Because I can get them off the street a [place] of a lot faster then I can get them in here." She is in stable condition.  Will continue to monitor.

## 2016-11-11 NOTE — H&P (Signed)
History and Physical    Lindsay Salazar XNT:[id number] DOB: 01-Sep-1950 DOA: 11/11/2016  PCP: [name], PA-C   Patient coming from: Home.  I have personally briefly reviewed patient's old medical records in [place]  Chief Complaint: Shortness of breath and abdominal pain.  HPI: Lindsay Salazar is a 67 y.o. female with medical history significant of anemia, fibromyalgia, chronic right hip pain, COPD/asthma, DVT, fibromyalgia, GERD, hypertension, history of pneumonia, history of pneumothorax, chronic atrial fibrillation, pancreatic cancer who is coming to the emergency department with complaints of progressively worse dyspnea for the past 3 days associated with light green to whitish productive cough and right lower quadrant abdominal pain since yesterday. She denies fever, chills, but feels fatigued. She denies chest pain, palpitations, dizziness, diaphoresis, PND, but complains of orthopnea and lower extremity edema. She denies nausea, emesis, diarrhea, constipation, melena or hematochezia. No dysuria, frequency or gross hematuria. She denies polyuria, polydipsia or blurred vision.   ED Course: Initial vital signs temperature 98.18F, pulse 82, blood pressure 141/68 mmHg an O2 sat 82% on room air. She received supplemental oxygen, albuterol 2.5 mg nebulizer treatment, furosemide 40 mg IVP 1, 2 doses of fentanyl 50 g and 25 g respectively. She mentions that she feels better.  Her workup shows urinalysis with large hemoglobinuria, 6-30 WBC and no bacteria on microscopic. WBC of 9.4, hemoglobin 7.5 g/dL (9.2 g/dL in July 2018) and platelets 253. Fecal occult blood was negative. Sodium 139, potassium 4.2, chloride 108, bicarbonate 23 mmol/L. BUN was 19, creatinine 1.16 and glucose 106 mg/dL. Lipase level was 23. BNP was 1319 pg/mL. EKG showed atrial fibrillation with significant artifact.  Imaging: Her chest radiograph shows vascular congestion consistent with mild pulmonary edema or  infection. CT renal protocol shows past symmetric prominence of right ureter and inflammation with a 3 mm renal stone in her bladder consistent with a passed urololith. Bilateral pleural effusions, increased size of pancreatic mass among other findings. Please see images and full radiology report for further detail.  Review of Systems: As per HPI otherwise 10 point review of systems negative.    Past Medical History:  Diagnosis Date  . Anemia of chronic disease   . Arthritis    bursitis , fibromyalgia  . Arthritis    "right hip" (11/27/2015)  . Asthma   . Chronic bronchitis ([place])   . Chronic respiratory failure ([place])   . Chronic right hip pain   . COPD (chronic obstructive pulmonary disease) ([place])    wears home o2 prn  . Daily headache    "last 2-3 months" (11/27/2015)  . DVT (deep venous thrombosis) ([place]) 03/2016  . Exposure to TB    "mom had it when she was pregnant with me"  . Fibromyalgia   . GERD (gastroesophageal reflux disease)    use to have it but not now  . Heart murmur    110-  36 years old  . Hyperkalemia   . Hypertension   . On home oxygen therapy    11/27/2015 "whenever I need it; don't know how many liters"  . PAF (paroxysmal atrial fibrillation) ([place])   . Pancreatic cancer ([place])   . Paroxysmal atrial flutter ([place])   . Pneumonia    "several times" (11/27/2015)  . Pneumothorax     Past Surgical History:  Procedure Laterality Date  . ANTERIOR CERVICAL DECOMP/DISCECTOMY FUSION     "put 4 screws in"  . CARDIAC CATHETERIZATION  08/2004   Archie Endo 06/18/2010  . CARDIOVERSION  N/A 07/18/2015   Procedure: CARDIOVERSION;  Surgeon: Josue Hector, MD;  Location[facility];  Service: Cardiovascular;  Laterality: N/A;  . ESOPHAGOGASTRODUODENOSCOPY N/A 08/09/2015   Procedure: ESOPHAGOGASTRODUODENOSCOPY (EGD);  Surgeon: Rogene Houston, MD;  Location: [facility];  Service: Endoscopy;  Laterality: N/A;  . EUS N/A 11/29/2015   Procedure: UPPER ENDOSCOPIC ULTRASOUND  (EUS) RADIAL;  Surgeon: Milus Banister, MD;  Location: [facility];  Service: Endoscopy;  Laterality: N/A;  . IR GENERIC HISTORICAL  12/18/2015   IR US GUIDE VASC ACCESS RIGHT 12/18/2015 Sandi Mariscal, MD [facility]  . IR GENERIC HISTORICAL  12/18/2015   IR FLUORO GUIDE PORT INSERTION RIGHT 12/18/2015 Sandi Mariscal, MD [facility]  . TONSILLECTOMY    . TUBAL LIGATION       reports that she quit smoking about 17 months ago. Her smoking use included Cigarettes. She started smoking about 47 years ago. She has a 23.00 pack-year smoking history. She has never used smokeless tobacco. She reports that she does not drink alcohol or use drugs.  Allergies  Allergen Reactions  . Ibuprofen Nausea And Vomiting  . Other     Seafood   . Penicillins Swelling    Has patient had a PCN reaction causing immediate rash, facial/tongue/throat swelling, SOB or lightheadedness with hypotension: Yes Has patient had a PCN reaction causing severe rash involving mucus membranes or skin necrosis: No Has patient had a PCN reaction that required hospitalization No Has patient had a PCN reaction occurring within the last 10 years: No If all of the above answers are "NO", then may proceed with Cephalosporin use.   . Tiotropium Bromide Monohydrate Itching  . Tramadol Nausea And Vomiting    Family History  Problem Relation Age of Onset  . COPD Mother   . Diabetes Father   . Stroke Father     Prior to Admission medications   Medication Sig Start Date End Date Taking? Authorizing Provider  COMBIVENT RESPIMAT 20-100 MCG/ACT AERS respimat Inhale 1 puff into the lungs every 6 (six) hours as needed for wheezing. 08/14/16   Emokpae, Ejiroghene E, MD  diltiazem (CARDIZEM CD) 240 MG 24 hr capsule Take 1 capsule (240 mg total) by mouth daily. 05/23/16 09/02/16  Lendon Colonel, NP  docusate sodium (COLACE) 100 MG capsule Take 1 capsule (100 mg total) by mouth 2 (two) times daily. 07/22/16   Isaac Bliss, Rayford Halsted, MD    ergocalciferol (VITAMIN D2) 50000 units capsule Take 1 capsule (50,000 Units total) by mouth once a week. 10/29/16   Twana First, MD  escitalopram (LEXAPRO) 10 MG tablet Take 1 tablet by mouth daily. 08/04/16   [provider]  feeding supplement, ENSURE ENLIVE, (ENSURE ENLIVE) LIQD Take 237 mLs by mouth 2 (two) times daily between meals. Patient not taking: Reported on 09/02/2016 08/22/16   Rama, Venetia Maxon, MD  furosemide (LASIX) 40 MG tablet Take 1 tablet (40 mg total) by mouth daily. 08/16/16   Emokpae, Ejiroghene E, MD  guaiFENesin (MUCINEX) 600 MG 12 hr tablet Take 2 tablets (1,200 mg total) by mouth 2 (two) times daily. 08/14/16   Emokpae, Ejiroghene E, MD  HYDROcodone-acetaminophen (NORCO/VICODIN) 5-325 MG tablet Take 1 tablet by mouth every 4 (four) hours as needed. 08/22/16   Twana First, MD  ipratropium-albuterol (DUONEB) 0.5-2.5 (3) MG/3ML SOLN Inhale 3 mLs into the lungs every 6 (six) hours as needed (for shortness of breath).  03/03/16   [provider]  LORazepam (ATIVAN) 0.5 MG tablet  Take 1 tablet (0.5 mg total) by mouth every 6 (six) hours as needed for anxiety. 08/14/16   Emokpae, Ejiroghene E, MD  meclizine (ANTIVERT) 25 MG tablet Take 1 tablet (25 mg total) by mouth 3 (three) times daily as needed for dizziness. 09/02/16   Varney Biles, MD  metoprolol tartrate (LOPRESSOR) 50 MG tablet Take 1 tablet (50 mg total) by mouth 2 (two) times daily. 03/02/16   Kathie Dike, MD  OXYGEN Inhale 2 L into the lungs daily as needed (for breathing).    [provider]  pantoprazole (PROTONIX) 40 MG tablet Take 1 tablet (40 mg total) by mouth daily. 12/03/15   Rai, Ripudeep K, MD  potassium chloride SA (K-DUR,KLOR-CON) 20 MEQ tablet Take 1 tablet (20 mEq total) by mouth daily. 08/16/16   Emokpae, Ejiroghene E, MD  predniSONE (DELTASONE) 20 MG tablet Take 2 tablets (40 mg total) by mouth daily with breakfast. 09/02/16   Varney Biles, MD  SYMBICORT 160-4.5 MCG/ACT inhaler  Inhale 1 puff into the lungs 2 (two) times daily. 12/03/15   Rai, Ripudeep K, MD  temazepam (RESTORIL) 15 MG capsule Take 15 mg by mouth at bedtime.    [provider]  XARELTO 20 MG TABS tablet Take 1 tablet by mouth daily. 08/31/16   [provider]    Physical Exam: Vitals:   11/11/16 0300 11/11/16 0315 11/11/16 0330 11/11/16 0350  BP: (!) 135/91  117/88   Pulse: (!) 109 (!) 53    Resp: (!) 28  (!) 25   Temp:      TempSrc:      SpO2: 96% 98%    Weight:    61.1 kg (134 lb 9.6 oz)  Height:        Constitutional: NAD, calm, comfortable Eyes: PERRL, lids and conjunctivae normal ENMT: Mucous membranes are moist. Posterior pharynx clear of any exudate or lesions. Partially absent and in poor state of repair of dentition.  Neck: normal, supple, no masses, no thyromegaly Respiratory: Decreased breath sounds and wheezing bilaterally, no crackles.  No accessory muscle use.  Cardiovascular: Tachycardic, irregularly irregular, no murmurs / rubs / gallops. 1+ left lower extremity and 2+ right lower extremity edema (the patient has been known for asymmetric edema or lower extremities). 2+ pedal pulses. No carotid bruits.  Abdomen: Soft, mild epigastric and RLQ tenderness, no masses palpated. No hepatosplenomegaly. Bowel sounds positive.  Musculoskeletal: no clubbing / cyanosis. Good ROM, no contractures. Normal muscle tone.  Skin: Multiple areas of ecchymosis on extremities. Neurologic: CN 2-12 grossly intact. Sensation intact, DTR normal. Strength 5/5 in all 4.  Psychiatric: Normal judgment and insight. Alert and oriented x 4. Normal mood.    Labs on Admission: I have personally reviewed following labs and imaging studies  CBC:  Recent Labs Lab 11/11/16 0043  WBC 9.4  NEUTROABS 6.9  HGB 7.5*  HCT 24.8*  MCV 83.8  PLT 322   Basic Metabolic Panel:  Recent Labs Lab 11/11/16 0043  NA 139  K 4.2  CL 108  CO2 23  GLUCOSE 106*  BUN 19  CREATININE 1.16*    CALCIUM 8.8*   GFR: Estimated Creatinine Clearance: 40.1 mL/min (A) (by C-G formula based on SCr of 1.16 mg/dL (H)). Liver Function Tests:  Recent Labs Lab 11/11/16 0043  AST 17  ALT 11*  ALKPHOS 83  BILITOT 0.4  PROT 6.3*  ALBUMIN 3.0*    Recent Labs Lab 11/11/16 0043  LIPASE 23   No results for input(s): AMMONIA  in the last 168 hours. Coagulation Profile: No results for input(s): INR, PROTIME in the last 168 hours. Cardiac Enzymes: No results for input(s): CKTOTAL, CKMB, CKMBINDEX, TROPONINI in the last 168 hours. BNP (last 3 results) No results for input(s): PROBNP in the last 8760 hours. HbA1C: No results for input(s): HGBA1C in the last 72 hours. CBG: No results for input(s): GLUCAP in the last 168 hours. Lipid Profile: No results for input(s): CHOL, HDL, LDLCALC, TRIG, CHOLHDL, LDLDIRECT in the last 72 hours. Thyroid Function Tests: No results for input(s): TSH, T4TOTAL, FREET4, T3FREE, THYROIDAB in the last 72 hours. Anemia Panel: No results for input(s): VITAMINB12, FOLATE, FERRITIN, TIBC, IRON, RETICCTPCT in the last 72 hours. Urine analysis:    Component Value Date/Time   COLORURINE YELLOW 11/11/2016 0103   APPEARANCEUR CLEAR 11/11/2016 0103   LABSPEC 1.014 11/11/2016 0103   PHURINE 5.0 11/11/2016 0103   GLUCOSEU NEGATIVE 11/11/2016 0103   HGBUR LARGE (A) 11/11/2016 0103   BILIRUBINUR NEGATIVE 11/11/2016 0103   KETONESUR NEGATIVE 11/11/2016 0103   PROTEINUR NEGATIVE 11/11/2016 0103   NITRITE NEGATIVE 11/11/2016 0103   LEUKOCYTESUR SMALL (A) 11/11/2016 0103    Radiological Exams on Admission: Dg Chest Portable 1 View  Result Date: 11/11/2016 CLINICAL DATA:  Shortness of breath, cough, RIGHT lower abdominal pain, history COPD, respiratory failure, bronchitis, hypertension, paroxysmal atrial fibrillation, former smoker EXAM: PORTABLE CHEST 1 VIEW COMPARISON:  Portable exam 0023 hours compared to 09/02/2016 FINDINGS: RIGHT jugular Port-A-Cath with tip  projecting over cavoatrial junction. Upper normal size of cardiac silhouette with pulmonary vascular congestion. Atherosclerotic calcification aorta. Emphysematous changes with interstitial prominence increased since previous exam question minimal edema or infection. No gross pleural effusion or pneumothorax. Bones demineralized. IMPRESSION: Pulmonary vascular congestion with increased interstitial prominence since the previous exam question mild pulmonary edema or infection. Underlying emphysematous changes. Electronically Signed   By: Lavonia Dana M.D.   On: 11/11/2016 00:55   Ct Renal Stone Study  Result Date: 11/11/2016 CLINICAL DATA:  Acute onset of left flank pain. Hematuria. Initial encounter. EXAM: CT ABDOMEN AND PELVIS WITHOUT CONTRAST TECHNIQUE: Multidetector CT imaging of the abdomen and pelvis was performed following the standard protocol without IV contrast. COMPARISON:  CT of the abdomen performed 07/31/2016, and renal ultrasound performed 08/11/2016 FINDINGS: Lower chest: Small bilateral pleural effusions are noted. Mild bibasilar interstitial prominence may reflect interstitial edema. The visualized portions of the mediastinum are unremarkable. Hepatobiliary: A large 6.9 cm cyst is noted at the left hepatic lobe. The liver is otherwise grossly unremarkable. The gallbladder is within normal limits. The common bile duct remains normal in caliber. Pancreas: The known mass at the body of the pancreas may have increased in size, perhaps measuring 3.7 cm in length, though not well assessed without contrast. There is underlying diffuse dilatation of the pancreatic duct at the pancreatic tail. Spleen: The spleen is unremarkable in appearance. Adrenals/Urinary Tract: The adrenal glands are unremarkable in appearance. Nonobstructing right renal stones measure up to 3 mm in size. Nonspecific perinephric stranding is seen bilaterally. A small right renal cyst is noted. There is no evidence of hydronephrosis.  No renal or ureteral stones are identified. There is asymmetric prominence of the right ureter along its entire course, with surrounding soft tissue inflammation. A 3 mm stone is noted at the base of the bladder. This likely reflects a recently passed stone. Stomach/Bowel: The stomach is unremarkable in appearance. The small bowel is within normal limits. The appendix is normal in caliber, without evidence of  appendicitis. The colon is unremarkable in appearance. Vascular/Lymphatic: Diffuse calcification is seen along the abdominal aorta and its branches. The abdominal aorta is otherwise grossly unremarkable. The inferior vena cava is grossly unremarkable. No retroperitoneal lymphadenopathy is seen. No pelvic sidewall lymphadenopathy is identified. Reproductive: The bladder is mildly distended and otherwise unremarkable. The uterus is unremarkable in appearance. Trace free fluid is noted within the pelvis. No suspicious adnexal masses are seen. The ovaries are grossly symmetric. Other: A small umbilical hernia is noted, containing only fat. Musculoskeletal: No acute osseous abnormalities are identified. Vacuum phenomenon is noted at L5-S1. The visualized musculature is unremarkable in appearance. IMPRESSION: 1. Asymmetric prominence of the right ureter along its entire course, with surrounding soft tissue inflammation. 3 mm stone at the base of the bladder. This likely reflects a recently passed stone. No evidence of hydronephrosis at this time. 2. Nonobstructing right renal stones measure up to 3 mm in size. Small right renal cyst noted. 3. Small bilateral pleural effusions. Mild bibasilar interstitial prominence may reflect interstitial edema. 4. Known mass at the body of the pancreas may have increased in size since the prior study, now measuring 3.7 cm, though not well characterized without contrast. Underlying diffuse dilatation of the pancreatic duct at the pancreatic tail. 5. Large hepatic cyst again noted.  6. Diffuse aortic atherosclerosis. 7. Small umbilical hernia, containing only fat. Electronically Signed   By: Garald Balding M.D.   On: 11/11/2016 03:07   Echocardiogram 05/30/2016  ------------------------------------------------------------------- LV EF: 60% -   65%  ------------------------------------------------------------------- Indications:      Dyspnea 786.09.  ------------------------------------------------------------------- History:   PMH:   Atrial fibrillation.  Chronic obstructive pulmonary disease.  PMH:  Pancreatic cancer Chronic anticoagulation-Xarelto.  Risk factors:  Hypertension.  ------------------------------------------------------------------- Study Conclusions  - Left ventricle: The cavity size was normal. Wall thickness was   increased in a pattern of mild LVH. Systolic function was normal.   The estimated ejection fraction was in the range of 60% to 65%.   Wall motion was normal; there were no regional wall motion   abnormalities. The study is not technically sufficient to allow   evaluation of LV diastolic function. - Aortic valve: Moderately calcified annulus. Trileaflet; mildly   calcified leaflets. Mean gradient (S): 3 mm Hg. - Mitral valve: Calcified annulus. There was mild regurgitation. - Left atrium: The atrium was mildly dilated. - Right atrium: The atrium was mildly dilated. Central venous   pressure (est): 3 mm Hg. - Atrial septum: No defect or patent foramen ovale was identified. - Tricuspid valve: There was moderate regurgitation. - Pulmonary arteries: PA peak pressure: 38 mm Hg (S). - Pericardium, extracardiac: A prominent pericardial fat pad was   present.  Impressions:  - Mild LVH with LVEF 60-65%, indeterminate diastolic function in   the setting of apparent atrial flutter. Mild left atrial   enlargement. Calcified mitral annulus with mild mitral   regurgitation. Sclerotic aortic valve without stenosis. Moderate   tricuspid  regurgitation with PASP 38 mmHg. Mild right atrial   enlargement.   EKG: Independently reviewed. Vent. rate 108 BPM PR interval * ms QRS duration 76 ms QT/QTc 380/493 ms P-R-T axes * 39 86 Atrial fibrillation Low voltage, extremity leads Probable anteroseptal infarct, old Minimal ST depression, inferior leads Interpretation limited secondary to artifact  Assessment/Plan Principal Problem:   COPD exacerbation (HCC) Telemetry/observation. Continue supplemental oxygen. Levalbuterol nebulizer treatment every 6 hrs. Ipratropium nebs every 6 hours. Solu-Medrol 40 mg IVP every 6 hours. Magnesium sulfate 2  g IVPB.  Active Problems:  Volume overload Daily fluid restriction. Continue supplemental oxygen. Furosemide 40 mg IVP twice a day. Monitor intake and output. Daily weights. Follow-up BUN, creatinine and electrolytes.    Atrial fibrillation with RVR (HCC) CHA?DS?-VASc Score of at least 4. Increased heart rate due to beta agonist. Magnesium sulfate 2 g IVPB. Continue supplemental oxygen. Continue Cardizem 240 mg by mouth daily. Metoprolol 2.5 mg IVP 1 dose given in NAD. Continue oral metoprolol. Keep electrolytes optimized.    Hypomagnesemia Replacement ordered.    Pancreatic cancer [facility]) Continue treatment and follow-ups per oncology. Norco as needed for abdominal pain.    Anemia of chronic disease Anemia panel show increased ferritin and decreased folic acid in MGNOIBB/0488. Low normal vitamin B12 level at 2 27 pg/mL in December 2017. Continue Q91 and folic acid supplementation. Monitor hematocrit and hemoglobin. Transfuse as needed.    Essential hypertension Continue Cardizem CD 240 mg by mouth daily. Continue metoprolol titrate 50 mg by mouth twice a day. Monitor blood pressure.    Urolithiasis Apparently passed an urolith recently. Likely the cause for her pain. Multiple nonobstructing renal stones on imaging.    CKD (chronic kidney disease) stage  3, GFR 30-59 ml/min (HCC) Monitor renal function and electrolytes.    GERD (gastroesophageal reflux disease) Pantoprazole 40 mg by mouth daily.      DVT prophylaxis: On Xarelto. Code Status: Full code. Family Communication:  Disposition Plan: Admit for COPD and CHF exacerbation treatment. Consults called:  Admission status: Observation/telemetry.   Reubin Milan MD [facility] Pager [phone].  If 7PM-7AM, please contact night-coverage [email or web address] Password [facility]  11/11/2016, 4:34 AM   This document was prepared using Dragon voice recognition software and may contain some unintended errors.

## 2016-11-11 NOTE — Care Management Note (Addendum)
Case Management Note  Patient Details  Name: Lindsay Salazar MRN: [id number] Date of Birth: November 12, 1950  Subjective/Objective:  Adm with COPD exacerbation, CHF, and renal stone in bladder. Patient has a history of pancreatic cancer, no longer receiving treatments. Patient is well know to this CM. Patient has a long history of refusing help and resources. She routinely needs SNF but refuses. Has been fired from multiple Home health agencies in the past and continues to decline need for home health. Her most recent [place] was [place]. She declines need for home health at this current time. She reports she has increased her ind with ADL's since her last admission. Reports walking with a cane, ind with bathing and dressing. Significant other cooks and cleans. A barrier to home health in the past has been large aggressive dogs in the home, patient states dogs are no longer there. She has oxygen PTA provided by [facility].             Action/Plan: Anticipate DC home with self care, as patient declines any home health services.    Expected Discharge Date:       11/13/2016           Expected Discharge Plan:  Home/Self Care  In-House Referral:     Discharge planning Services  CM Consult  Post Acute Care Choice:  NA Choice offered to:  NA  DME Arranged:    DME Agency:     HH Arranged:    HH Agency:     Status of Service:  Completed, signed off  If discussed at [employer] of Stay Meetings, dates discussed:    Additional Comments:  Kip Kautzman, Chauncey [place], RN 11/11/2016, 12:31 PM

## 2016-11-11 NOTE — Progress Notes (Signed)
Pt requested PRN Hydrocodone 5-325. After scanning patient, and scanning medication, the medication was removed from packaging, given to patient.  She made a motion with her right hand as if she placed the tablet in her mouth, keeping the hand clenched the entire time.  Keeping the fist clenched, she used her left hand to hold her cup and drink water.  She then stated..."I bet I sleep good now".  I requested that the patient show me what she had in her right hand that remained clenched.  She refused.  As I looked at her right hand (still clenched), I could see the medication in her fist.  I took the tablet from her hand and educated her as she cursed at me.   Per Dr. Darrick Meigs, verbal order to D/C PRN Hydrocodone 5-325 mg.  Will continue to monitor patient throughout shift and offer alternatives for pain.

## 2016-11-11 NOTE — Care Management Obs Status (Signed)
[place] NOTIFICATION   Patient Details  Name: GEANINE VANDEKAMP MRN: [id number] Date of Birth: [birth year]   Medicare Observation Status Notification Given:  Yes    Ramces Shomaker, Chauncey Reading, RN 11/11/2016, 11:08 AM

## 2016-11-11 NOTE — Progress Notes (Signed)
Subjective: Patient admitted this morning, see detailed H&P by Dr Olevia Bowens 66 year old female with a history of chronic anemia, fibromyalgia, COPD, DVT, GERD, hypertension, chronic atrial fibrillation, pancreatic cancer who came to the ED with worsening shortness of breath  In the ED patient was found to be in COPD exacerbation, pulmonary edema and worsening anemia .    Vitals:   11/11/16 0552 11/11/16 0809  BP: 106/82   Pulse: (!) 51   Resp: (!) 22   Temp: 99.1 F (37.3 C)   SpO2: 92% 94%    Exam - wheezing bilaterally on lungs auscultation   A/P Acute on chronic anemia  COPD exacerbation Pulmonary edema   atrial fibrillation with RVR History of pancreatic cancer Ureterolithiasis  Hemoglobin is 7.5, stool occult blood was negative in the ED. Follow H&H as transfuse for hemoglobin less than 7  Continue ipratropium every 6 hours  Lasix 40 mg IV twice a day  Patient currently not on treatment for pancreatic cancer, no chemotherapy, not a surgical candidate as per chart notes. Follow-up oncology as outpatient  passed ureteral stone recently, as per CT scan.     [place] [facility] [phone]

## 2016-11-12 ENCOUNTER — Other Ambulatory Visit (HOSPITAL_COMMUNITY): Payer: Self-pay

## 2016-11-12 ENCOUNTER — Inpatient Hospital Stay (HOSPITAL_COMMUNITY): Payer: Medicare HMO

## 2016-11-12 DIAGNOSIS — I5031 Acute diastolic (congestive) heart failure: Secondary | ICD-10-CM

## 2016-11-12 LAB — HEMOGLOBIN AND HEMATOCRIT, BLOOD
HEMATOCRIT: 27.4 % — AB (ref 36.0–46.0)
Hemoglobin: 8.6 g/dL — ABNORMAL LOW (ref 12.0–15.0)

## 2016-11-12 LAB — BASIC METABOLIC PANEL
Anion gap: 12 (ref 5–15)
BUN: 27 mg/dL — AB (ref 6–20)
CHLORIDE: 100 mmol/L — AB (ref 101–111)
CO2: 24 mmol/L (ref 22–32)
CREATININE: 1.47 mg/dL — AB (ref 0.44–1.00)
Calcium: 8.3 mg/dL — ABNORMAL LOW (ref 8.9–10.3)
GFR, EST AFRICAN AMERICAN: 41 mL/min — AB (ref >60)
GFR, EST NON AFRICAN AMERICAN: 35 mL/min — AB (ref >60)
Glucose, Bld: 170 mg/dL — ABNORMAL HIGH (ref 65–99)
Potassium: 3.3 mmol/L — ABNORMAL LOW (ref 3.5–5.1)
SODIUM: 136 mmol/L (ref 135–145)

## 2016-11-12 LAB — PREPARE RBC (CROSSMATCH)

## 2016-11-12 LAB — CBC WITH DIFFERENTIAL/PLATELET
BASOS ABS: 0 10*3/uL (ref 0.0–0.1)
BASOS PCT: 0 %
EOS ABS: 0 10*3/uL (ref 0.0–0.7)
EOS PCT: 0 %
HCT: 22.7 % — ABNORMAL LOW (ref 36.0–46.0)
HEMOGLOBIN: 7 g/dL — AB (ref 12.0–15.0)
LYMPHS ABS: 0.8 10*3/uL (ref 0.7–4.0)
Lymphocytes Relative: 14 %
MCH: 25 pg — AB (ref 26.0–34.0)
MCHC: 30.8 g/dL (ref 30.0–36.0)
MCV: 81.1 fL (ref 78.0–100.0)
Monocytes Absolute: 0.2 10*3/uL (ref 0.1–1.0)
Monocytes Relative: 3 %
NEUTROS PCT: 83 %
Neutro Abs: 5 10*3/uL (ref 1.7–7.7)
PLATELETS: 244 10*3/uL (ref 150–400)
RBC: 2.8 MIL/uL — AB (ref 3.87–5.11)
RDW: 17.9 % — ABNORMAL HIGH (ref 11.5–15.5)
WBC: 6.1 10*3/uL (ref 4.0–10.5)

## 2016-11-12 LAB — GLUCOSE, CAPILLARY
GLUCOSE-CAPILLARY: 198 mg/dL — AB (ref 65–99)
GLUCOSE-CAPILLARY: 176 mg/dL — AB (ref 65–99)
Glucose-Capillary: 160 mg/dL — ABNORMAL HIGH (ref 65–99)
Glucose-Capillary: 113 mg/dL — ABNORMAL HIGH (ref 65–99)

## 2016-11-12 LAB — MAGNESIUM: MAGNESIUM: 1.5 mg/dL — AB (ref 1.7–2.4)

## 2016-11-12 MED ORDER — HYDROCODONE-ACETAMINOPHEN 5-325 MG PO TABS
1.0000 | ORAL_TABLET | Freq: Four times a day (QID) | ORAL | Status: DC | PRN
  Administered 2016-11-12 – 2016-11-15 (×12): 1 via ORAL
  Filled 2016-11-12 (×12): qty 1

## 2016-11-12 MED ORDER — SODIUM CHLORIDE 0.9 % IV SOLN: Freq: Once | INTRAVENOUS | Status: DC

## 2016-11-12 MED ORDER — POTASSIUM CHLORIDE CRYS ER 20 MEQ PO TBCR
40.0000 meq | EXTENDED_RELEASE_TABLET | Freq: Once | ORAL | Status: AC
  Administered 2016-11-12: 40 meq via ORAL
  Filled 2016-11-12: qty 2

## 2016-11-12 MED ORDER — METHYLPREDNISOLONE SODIUM SUCC 125 MG IJ SOLR
60.0000 mg | Freq: Two times a day (BID) | INTRAMUSCULAR | Status: DC
  Administered 2016-11-12 – 2016-11-14 (×4): 60 mg via INTRAVENOUS
  Filled 2016-11-12 (×4): qty 2

## 2016-11-12 MED ORDER — FUROSEMIDE 10 MG/ML IJ SOLN
40.0000 mg | Freq: Every day | INTRAMUSCULAR | Status: DC
  Administered 2016-11-13 – 2016-11-14 (×2): 40 mg via INTRAVENOUS
  Filled 2016-11-12 (×2): qty 4

## 2016-11-12 MED ORDER — MAGNESIUM SULFATE 4 GM/100ML IV SOLN
4.0000 g | Freq: Once | INTRAVENOUS | Status: AC
  Administered 2016-11-12: 4 g via INTRAVENOUS
  Filled 2016-11-12: qty 100

## 2016-11-12 NOTE — Progress Notes (Signed)
Nursing supervisor in to room to discuss with patient about patient experience. Nursing supervisor  in room with patient discussing patient care so far in hospital stay.  Service recovery lasted approximately 45 minutes.

## 2016-11-12 NOTE — Progress Notes (Signed)
CRITICAL VALUE ALERT  Critical Value:  7.0 hemoglobin  Date & Time Notied:  11/12/2016 0545  Provider Notified: Dr. Olevia Bowens  Orders Received/Actions taken:Recived order for 1 pack or RBC

## 2016-11-12 NOTE — Progress Notes (Addendum)
PROGRESS NOTE                                                                                                                                                                                                             Patient Demographics:    Lindsay Salazar, is a 66 y.o. female, DOB - Oct 20, 1950, EXH:[id number]  Admit date - 11/11/2016   Admitting Physician Reubin Milan, MD  Outpatient Primary MD for the patient is Baucom, Alfonso Ellis  LOS - 1  Outpatient Specialists: Dr Barry Dienes   Chief Complaint  Patient presents with  . Shortness of Breath       Brief Narrative  66 year old female with pancreatic adenocarcinoma (not considered a candidate for surgery due to her poor social status and poor outpatient follow-up, last chemotherapy in April 2018), COPD, fibromyalgia, GERD, hypertension, chronic A. fib on anticoagulation presented to the ED with 3 day history of progressive shortness of breath associated with productive cough and right lower quadrant abdominal pain. Patient was found to be hypoxic with O2 sat of 82% on room air in the ED and chest x-ray showing pulmonary vascular congestion. CT of the abdomen with renal protocol showed prominence of the right ureter and inflammation with a 3 mm renal stone in her bladder consistent with the past your left. Patient was also found to be in rapid A. fib. Patient admitted for acute COPD exacerbation/acute on chronic diastolic CHF and rapid A. fib.     Subjective:   Patient choked on her toast this morning. Denies having any issues with his swallowing at home. Complains of chronic pain in her back.   Assessment  & Plan :    Principal Problem:   COPD With acute exacerbation ([place]) Continue O2 via nasal cannula, scheduled DuoNeb's. Will reduce steroid dose to 60 mg every 12 hours. Still has some rhonchi bilaterally. Supportive care with antitussives.   Active  Problems: Acute CHF, possibly diastolic. 2-D echo from 6 months back with normal EF and no wall motion abnormality. Will repeat 2-D echo. Continue IV Lasix with strict I/O and daily weight.     Atrial fibrillation with RVR (HCC) Rate controlled. Continue beta blocker and Cardizem. Continue Xarelto.    Pancreatic cancer [facility]) Has pancreatic adenocarcinoma. As per hospital discharge note from 7/20, her last chemotherapy was in April of  this year. She was referred to surgery for distal pancreatectomy. She was deemed not a candidate for further chemotherapy due to poor social environment and lack of consistent follow-up.  palliative care were consulted and patient desired full scope of treatment.     Anemia of chronic disease Baseline hemoglobin around 9. Drop in hemoglobin to 7 this morning (Hemoccult negative). Will transfuse with 1 unit PRBC.    Essential hypertension Stable. Resume home medications   Acute on CKD (chronic kidney disease) stage 3, GFR 30-59 ml/min (HCC) Mildly worsened renal function from baseline possibly due to diuresis.. Will reduce her Lasix dose to 40 mg once a day.     Urolithiasis CT abdomen shows she likely passed right ureteral stone. Has a 3 mm stone at the base of the bladder. No hydronephrosis.  Hypokalemia/hypomagnesemia Replenished.  Chronic low back pain.  resumed her home pain medications.     Code Status : Full code  Family Communication  : None at bedside  Disposition Plan  : Home pending clinical improvement  Barriers For Discharge : Active symptoms  Consults  : None  Procedures  :  CT renal study 2-D echo  DVT Prophylaxis  :  SCDs  Lab Results  Component Value Date   PLT 244 11/12/2016    Antibiotics  :    Anti-infectives    None        Objective:   Vitals:   11/12/16 0641 11/12/16 0755 11/12/16 1058 11/12/16 1125  BP: 122/73  115/75 (!) 107/55  Pulse: 92  (!) 58 61  Resp: 16   18  Temp: 98.1 F (36.7 C)  99.1  F (37.3 C) (!) 97.5 F (36.4 C)  TempSrc: Oral  Oral Oral  SpO2: 99% 100% 93% 95%  Weight: 63 kg (139 lb)     Height:        Wt Readings from Last 3 Encounters:  11/12/16 63 kg (139 lb)  09/02/16 59.9 kg (132 lb)  08/22/16 60.2 kg (132 lb 11.2 oz)     Intake/Output Summary (Last 24 hours) at 11/12/16 1153 Last data filed at 11/12/16 1110  Gross per 24 hour  Intake             1220 ml  Output             2550 ml  Net            -1330 ml     Physical Exam  Gen: not in distress, Appears fatigued HEENT: Pallor present, moist mucosa, supple neck Chest: Scattered rhonchi bilaterally CVS: S1 and S2 irregular, no murmurs rub or gallop GI: soft, NT, ND, BS+ Musculoskeletal: warm, no edema     Data Review:    CBC  Recent Labs Lab 11/11/16 0043 11/11/16 1359 11/12/16 0417  WBC 9.4  --  6.1  HGB 7.5* 7.9* 7.0*  HCT 24.8* 26.3* 22.7*  PLT 253  --  244  MCV 83.8  --  81.1  MCH 25.3*  --  25.0*  MCHC 30.2  --  30.8  RDW 17.9*  --  17.9*  LYMPHSABS 1.7  --  0.8  MONOABS 0.7  --  0.2  EOSABS 0.1  --  0.0  BASOSABS 0.0  --  0.0    Chemistries   Recent Labs Lab 11/11/16 0043 11/11/16 1359 11/12/16 0417 11/12/16 0800  NA 139 137 136  --   K 4.2 4.1 3.3*  --   CL 108 102 100*  --  CO2 23 22 24   --   GLUCOSE 106* 121* 170*  --   BUN 19 19 27*  --   CREATININE 1.16* 1.35* 1.47*  --   CALCIUM 8.8* 8.7* 8.3*  --   MG 1.6*  --   --  1.5*  AST 17  --   --   --   ALT 11*  --   --   --   ALKPHOS 83  --   --   --   BILITOT 0.4  --   --   --    ------------------------------------------------------------------------------------------------------------------ No results for input(s): CHOL, HDL, LDLCALC, TRIG, CHOLHDL, LDLDIRECT in the last 72 hours.  Lab Results  Component Value Date   HGBA1C 5.7 (H) 06/08/2016   ------------------------------------------------------------------------------------------------------------------ No results for input(s): TSH,  T4TOTAL, T3FREE, THYROIDAB in the last 72 hours.  Invalid input(s): FREET3 ------------------------------------------------------------------------------------------------------------------ No results for input(s): VITAMINB12, FOLATE, FERRITIN, TIBC, IRON, RETICCTPCT in the last 72 hours.  Coagulation profile No results for input(s): INR, PROTIME in the last 168 hours.  No results for input(s): DDIMER in the last 72 hours.  Cardiac Enzymes No results for input(s): CKMB, TROPONINI, MYOGLOBIN in the last 168 hours.  Invalid input(s): CK ------------------------------------------------------------------------------------------------------------------    Component Value Date/Time   BNP 1,319.0 (H) 11/11/2016 0043    Inpatient Medications  Scheduled Meds: . diltiazem  240 mg Oral Daily  . docusate sodium  100 mg Oral BID  . escitalopram  10 mg Oral Daily  . furosemide  40 mg Intravenous BID  . guaiFENesin  1,200 mg Oral BID  . insulin aspart  0-15 Units Subcutaneous TID WC  . ipratropium  0.5 mg Nebulization Q6H  . levalbuterol  1.25 mg Nebulization Q6H  . mouth rinse  15 mL Mouth Rinse BID  . methylPREDNISolone (SOLU-MEDROL) injection  60 mg Intravenous Q12H  . metoprolol tartrate  50 mg Oral BID  . pantoprazole  40 mg Oral Daily  . potassium chloride SA  20 mEq Oral Daily  . rivaroxaban  20 mg Oral Daily   Continuous Infusions: . sodium chloride     PRN Meds:.albuterol, alum & mag hydroxide-simeth, HYDROcodone-acetaminophen, LORazepam, meclizine, temazepam  Micro Results No results found for this or any previous visit (from the past 240 hour(s)).  Radiology Reports Dg Chest Portable 1 View  Result Date: 11/11/2016 CLINICAL DATA:  Shortness of breath, cough, RIGHT lower abdominal pain, history COPD, respiratory failure, bronchitis, hypertension, paroxysmal atrial fibrillation, former smoker EXAM: PORTABLE CHEST 1 VIEW COMPARISON:  Portable exam 0023 hours compared to  09/02/2016 FINDINGS: RIGHT jugular Port-A-Cath with tip projecting over cavoatrial junction. Upper normal size of cardiac silhouette with pulmonary vascular congestion. Atherosclerotic calcification aorta. Emphysematous changes with interstitial prominence increased since previous exam question minimal edema or infection. No gross pleural effusion or pneumothorax. Bones demineralized. IMPRESSION: Pulmonary vascular congestion with increased interstitial prominence since the previous exam question mild pulmonary edema or infection. Underlying emphysematous changes. Electronically Signed   By: Lavonia Dana M.D.   On: 11/11/2016 00:55   Ct Renal Stone Study  Result Date: 11/11/2016 CLINICAL DATA:  Acute onset of left flank pain. Hematuria. Initial encounter. EXAM: CT ABDOMEN AND PELVIS WITHOUT CONTRAST TECHNIQUE: Multidetector CT imaging of the abdomen and pelvis was performed following the standard protocol without IV contrast. COMPARISON:  CT of the abdomen performed 07/31/2016, and renal ultrasound performed 08/11/2016 FINDINGS: Lower chest: Small bilateral pleural effusions are noted. Mild bibasilar interstitial prominence may reflect interstitial edema. The visualized portions of  the mediastinum are unremarkable. Hepatobiliary: A large 6.9 cm cyst is noted at the left hepatic lobe. The liver is otherwise grossly unremarkable. The gallbladder is within normal limits. The common bile duct remains normal in caliber. Pancreas: The known mass at the body of the pancreas may have increased in size, perhaps measuring 3.7 cm in length, though not well assessed without contrast. There is underlying diffuse dilatation of the pancreatic duct at the pancreatic tail. Spleen: The spleen is unremarkable in appearance. Adrenals/Urinary Tract: The adrenal glands are unremarkable in appearance. Nonobstructing right renal stones measure up to 3 mm in size. Nonspecific perinephric stranding is seen bilaterally. A small right renal  cyst is noted. There is no evidence of hydronephrosis. No renal or ureteral stones are identified. There is asymmetric prominence of the right ureter along its entire course, with surrounding soft tissue inflammation. A 3 mm stone is noted at the base of the bladder. This likely reflects a recently passed stone. Stomach/Bowel: The stomach is unremarkable in appearance. The small bowel is within normal limits. The appendix is normal in caliber, without evidence of appendicitis. The colon is unremarkable in appearance. Vascular/Lymphatic: Diffuse calcification is seen along the abdominal aorta and its branches. The abdominal aorta is otherwise grossly unremarkable. The inferior vena cava is grossly unremarkable. No retroperitoneal lymphadenopathy is seen. No pelvic sidewall lymphadenopathy is identified. Reproductive: The bladder is mildly distended and otherwise unremarkable. The uterus is unremarkable in appearance. Trace free fluid is noted within the pelvis. No suspicious adnexal masses are seen. The ovaries are grossly symmetric. Other: A small umbilical hernia is noted, containing only fat. Musculoskeletal: No acute osseous abnormalities are identified. Vacuum phenomenon is noted at L5-S1. The visualized musculature is unremarkable in appearance. IMPRESSION: 1. Asymmetric prominence of the right ureter along its entire course, with surrounding soft tissue inflammation. 3 mm stone at the base of the bladder. This likely reflects a recently passed stone. No evidence of hydronephrosis at this time. 2. Nonobstructing right renal stones measure up to 3 mm in size. Small right renal cyst noted. 3. Small bilateral pleural effusions. Mild bibasilar interstitial prominence may reflect interstitial edema. 4. Known mass at the body of the pancreas may have increased in size since the prior study, now measuring 3.7 cm, though not well characterized without contrast. Underlying diffuse dilatation of the pancreatic duct at  the pancreatic tail. 5. Large hepatic cyst again noted. 6. Diffuse aortic atherosclerosis. 7. Small umbilical hernia, containing only fat. Electronically Signed   By: Garald Balding M.D.   On: 11/11/2016 03:07    Time Spent in minutes 35   Jujuan Dugo M.D on 11/12/2016 at 11:53 AM  Between 7am to 7pm - Pager - [phone]  After 7pm go to [email or web address] - password [email or web address]  [facility] -  Office  [phone]

## 2016-11-13 ENCOUNTER — Inpatient Hospital Stay (HOSPITAL_COMMUNITY): Payer: Medicare HMO

## 2016-11-13 DIAGNOSIS — C259 Malignant neoplasm of pancreas, unspecified: Secondary | ICD-10-CM

## 2016-11-13 DIAGNOSIS — D62 Acute posthemorrhagic anemia: Secondary | ICD-10-CM

## 2016-11-13 DIAGNOSIS — N201 Calculus of ureter: Secondary | ICD-10-CM

## 2016-11-13 DIAGNOSIS — I361 Nonrheumatic tricuspid (valve) insufficiency: Secondary | ICD-10-CM

## 2016-11-13 DIAGNOSIS — I10 Essential (primary) hypertension: Secondary | ICD-10-CM

## 2016-11-13 LAB — BASIC METABOLIC PANEL
ANION GAP: 11 (ref 5–15)
BUN: 36 mg/dL — ABNORMAL HIGH (ref 6–20)
CALCIUM: 8.1 mg/dL — AB (ref 8.9–10.3)
CO2: 26 mmol/L (ref 22–32)
Chloride: 95 mmol/L — ABNORMAL LOW (ref 101–111)
Creatinine, Ser: 1.34 mg/dL — ABNORMAL HIGH (ref 0.44–1.00)
GFR, EST AFRICAN AMERICAN: 46 mL/min — AB (ref >60)
GFR, EST NON AFRICAN AMERICAN: 40 mL/min — AB (ref >60)
Glucose, Bld: 155 mg/dL — ABNORMAL HIGH (ref 65–99)
POTASSIUM: 4.1 mmol/L (ref 3.5–5.1)
Sodium: 132 mmol/L — ABNORMAL LOW (ref 135–145)

## 2016-11-13 LAB — CBC
HEMATOCRIT: 27.5 % — AB (ref 36.0–46.0)
HEMOGLOBIN: 8.6 g/dL — AB (ref 12.0–15.0)
MCH: 24.9 pg — ABNORMAL LOW (ref 26.0–34.0)
MCHC: 31.3 g/dL (ref 30.0–36.0)
MCV: 79.7 fL (ref 78.0–100.0)
Platelets: 256 10*3/uL (ref 150–400)
RBC: 3.45 MIL/uL — AB (ref 3.87–5.11)
RDW: 17.8 % — ABNORMAL HIGH (ref 11.5–15.5)
WBC: 8.1 10*3/uL (ref 4.0–10.5)

## 2016-11-13 LAB — ECHOCARDIOGRAM COMPLETE
HEIGHTINCHES: 64 in
Weight: 2224 oz

## 2016-11-13 LAB — GLUCOSE, CAPILLARY
GLUCOSE-CAPILLARY: 179 mg/dL — AB (ref 65–99)
GLUCOSE-CAPILLARY: 155 mg/dL — AB (ref 65–99)
GLUCOSE-CAPILLARY: 157 mg/dL — AB (ref 65–99)
GLUCOSE-CAPILLARY: 160 mg/dL — AB (ref 65–99)

## 2016-11-13 LAB — TYPE AND SCREEN
ABO/RH(D): O POS
Antibody Screen: NEGATIVE
Unit division: 0

## 2016-11-13 LAB — BPAM RBC
BLOOD PRODUCT EXPIRATION DATE: 201810162359
ISSUE DATE / TIME: 201810101059
UNIT TYPE AND RH: 5100

## 2016-11-13 MED ORDER — LEVALBUTEROL HCL 1.25 MG/0.5ML IN NEBU
1.2500 mg | INHALATION_SOLUTION | Freq: Three times a day (TID) | RESPIRATORY_TRACT | Status: DC
  Administered 2016-11-14 – 2016-11-15 (×5): 1.25 mg via RESPIRATORY_TRACT
  Filled 2016-11-13 (×5): qty 0.5

## 2016-11-13 MED ORDER — LEVALBUTEROL HCL 1.25 MG/0.5ML IN NEBU
1.2500 mg | INHALATION_SOLUTION | Freq: Four times a day (QID) | RESPIRATORY_TRACT | Status: DC | PRN
  Administered 2016-11-14: 1.25 mg via RESPIRATORY_TRACT
  Filled 2016-11-13: qty 0.5

## 2016-11-13 MED ORDER — IPRATROPIUM BROMIDE 0.02 % IN SOLN
0.5000 mg | Freq: Three times a day (TID) | RESPIRATORY_TRACT | Status: DC
  Administered 2016-11-14 – 2016-11-15 (×5): 0.5 mg via RESPIRATORY_TRACT
  Filled 2016-11-13 (×5): qty 2.5

## 2016-11-13 NOTE — Progress Notes (Signed)
[facility]  PROGRESS NOTE  Lindsay Salazar AOZ:[id number] DOB: October 26, 1950 DOA: 11/11/2016 PCP: Vesta Mixer   Brief HPI:   66 year old female with pancreatic adenocarcinoma (not considered a candidate for surgery due to her poor social status and poor outpatient follow-up, last chemotherapy in April 2018), COPD, fibromyalgia, GERD, hypertension, chronic A. fib on anticoagulation presented to the ED with 3 day history of progressive shortness of breath associated with productive cough and right lower quadrant abdominal pain. Patient was found to be hypoxic with O2 sat of 82% on room air in the ED and chest x-ray showing pulmonary vascular congestion. CT of the abdomen with renal protocol showed prominence of the right ureter and inflammation with a 3 mm renal stone in her bladder consistent with the past your left. Patient was also found to be in rapid A. fib. Patient admitted for acute COPD exacerbation/acute on chronic diastolic CHF and rapid A. fib.     Subjective   Patient is breathing better. Denies chest pain. Echocardiogram is pending   Assessment/Plan:     1. COPD exacerbation-improving, continue Solu-Medrol 60 g every 6 hours, DuoNeb nebulizers. 2. Acute CHF? Diastolic-2-D echo from 6 months ago showed normal EF with no wall motion abnormality. 2-D echo done today is currently pending. Continue IV Lasix 40 mg daily. Will follow BMP in am. 3. Atrial fibrillation with RVR-heart rate is controlled, continue beta blocker, Cardizem. Anti-coagulation with Xarelto. 4. Pancreatic cancer- patient has history of pancreatic adenocarcinoma, last chemotherapy was in April. Patient also to surgery for distal pancreatectomy. And she was deemed not a candidate for chemotherapy due to poor social environment and lack of consistent follow-up. 5. Anemia of chronic disease-Patient is status post 1 unit PRBC, today hemoglobin is 8.6. 6. Hypertension-blood pressure stable, continue home  medications. 7. Acute on CK D stage III- improved after dose of Lasix was changed yesterday. Today creatinine is 1.34. Follow BMP in am. 8. Ureterolithiasis-patient likely has passed right renal stone as per CT scan. She has 3 mm stone at the base of bladder. No hydronephrosis. 9. Hypomagnesemia-management was S2. Will recheck magnesium in a.m.    DVT prophylaxis: SCDs  Code Status: Full code  Family Communication: No family at bedside   Disposition Plan: Likely home    Consultants:  None  Procedures:  None  Continuous infusions . sodium chloride        Antibiotics:   Anti-infectives    None       Objective   Vitals:   11/12/16 1906 11/12/16 2138 11/13/16 0457 11/13/16 0754  BP:  112/76 111/81   Pulse:  99 87   Resp:  20 18   Temp:  98.2 F (36.8 C) 98.3 F (36.8 C)   TempSrc:  Oral Oral   SpO2: 96% 100% 98% 98%  Weight:      Height:        Intake/Output Summary (Last 24 hours) at 11/13/16 1341 Last data filed at 11/13/16 1000  Gross per 24 hour  Intake             1454 ml  Output             1100 ml  Net              354 ml   Filed Weights   11/11/16 0350 11/11/16 0552 11/12/16 0641  Weight: 61.1 kg (134 lb 9.6 oz) 63.5 kg (140 lb 1.6 oz) 63 kg (139 lb)  Physical Examination:   Physical Exam: Eyes: No icterus, extraocular muscles intact  Mouth: Oral mucosa is moist, no lesions on palate,  Neck: Supple, no deformities, masses, or tenderness Lungs: Normal respiratory effort, bilateral wheezing Heart: Regular rate and rhythm, S1 and S2 normal, no murmurs, rubs auscultated Abdomen: BS normoactive,soft,nondistended,non-tender to palpation,no organomegaly Extremities: No pretibial edema, no erythema, no cyanosis, no clubbing Neuro : Alert and oriented to time, place and person, No focal deficits Skin: No rashes seen on exam    Data Reviewed: I have personally reviewed following labs and imaging studies  CBG:  Recent Labs Lab  11/12/16 1159 11/12/16 1626 11/12/16 2112 11/13/16 0741 11/13/16 1131  GLUCAP 113* 198* 176* 179* 155*    CBC:  Recent Labs Lab 11/11/16 0043 11/11/16 1359 11/12/16 0417 11/12/16 1433 11/13/16 0622  WBC 9.4  --  6.1  --  8.1  NEUTROABS 6.9  --  5.0  --   --   HGB 7.5* 7.9* 7.0* 8.6* 8.6*  HCT 24.8* 26.3* 22.7* 27.4* 27.5*  MCV 83.8  --  81.1  --  79.7  PLT 253  --  244  --  175    Basic Metabolic Panel:  Recent Labs Lab 11/11/16 0043 11/11/16 1359 11/12/16 0417 11/12/16 0800 11/13/16 0622  NA 139 137 136  --  132*  K 4.2 4.1 3.3*  --  4.1  CL 108 102 100*  --  95*  CO2 23 22 24   --  26  GLUCOSE 106* 121* 170*  --  155*  BUN 19 19 27*  --  36*  CREATININE 1.16* 1.35* 1.47*  --  1.34*  CALCIUM 8.8* 8.7* 8.3*  --  8.1*  MG 1.6*  --   --  1.5*  --   PHOS 3.2  --   --   --   --     No results found for this or any previous visit (from the past 240 hour(s)).   Liver Function Tests:  Recent Labs Lab 11/11/16 0043  AST 17  ALT 11*  ALKPHOS 83  BILITOT 0.4  PROT 6.3*  ALBUMIN 3.0*    Recent Labs Lab 11/11/16 0043  LIPASE 23   No results for input(s): AMMONIA in the last 168 hours.  Cardiac Enzymes: No results for input(s): CKTOTAL, CKMB, CKMBINDEX, TROPONINI in the last 168 hours. BNP (last 3 results)  Recent Labs  06/08/16 1140 08/15/16 1602 11/11/16 0043  BNP 876.0* 625.0* 1,319.0*    ProBNP (last 3 results) No results for input(s): PROBNP in the last 8760 hours.    Studies: No results found.  Scheduled Meds: . diltiazem  240 mg Oral Daily  . docusate sodium  100 mg Oral BID  . escitalopram  10 mg Oral Daily  . furosemide  40 mg Intravenous Daily  . guaiFENesin  1,200 mg Oral BID  . insulin aspart  0-15 Units Subcutaneous TID WC  . ipratropium  0.5 mg Nebulization Q6H  . levalbuterol  1.25 mg Nebulization Q6H  . mouth rinse  15 mL Mouth Rinse BID  . methylPREDNISolone (SOLU-MEDROL) injection  60 mg Intravenous Q12H  .  metoprolol tartrate  50 mg Oral BID  . pantoprazole  40 mg Oral Daily  . potassium chloride SA  20 mEq Oral Daily  . rivaroxaban  20 mg Oral Daily      Time spent: 25 min  [facility] Pager [phone]. If 7PM-7AM, please contact night-coverage at [email or web address], Office  [place]  11/13/2016, 1:41 PM  LOS: 2 days

## 2016-11-13 NOTE — Progress Notes (Signed)
*  PRELIMINARY RESULTS* Echocardiogram 2D Echocardiogram has been performed.  Marriah, Sanderlin 11/13/2016, 12:56 PM

## 2016-11-14 DIAGNOSIS — N183 Chronic kidney disease, stage 3 (moderate): Secondary | ICD-10-CM

## 2016-11-14 DIAGNOSIS — I4891 Unspecified atrial fibrillation: Secondary | ICD-10-CM

## 2016-11-14 DIAGNOSIS — D638 Anemia in other chronic diseases classified elsewhere: Secondary | ICD-10-CM

## 2016-11-14 LAB — GLUCOSE, CAPILLARY
Glucose-Capillary: 186 mg/dL — ABNORMAL HIGH (ref 65–99)
Glucose-Capillary: 250 mg/dL — ABNORMAL HIGH (ref 65–99)
Glucose-Capillary: 157 mg/dL — ABNORMAL HIGH (ref 65–99)
Glucose-Capillary: 189 mg/dL — ABNORMAL HIGH (ref 65–99)

## 2016-11-14 LAB — BASIC METABOLIC PANEL
ANION GAP: 10 (ref 5–15)
BUN: 55 mg/dL — ABNORMAL HIGH (ref 6–20)
CALCIUM: 7.9 mg/dL — AB (ref 8.9–10.3)
CO2: 26 mmol/L (ref 22–32)
CREATININE: 1.8 mg/dL — AB (ref 0.44–1.00)
Chloride: 93 mmol/L — ABNORMAL LOW (ref 101–111)
GFR calc Af Amer: 32 mL/min — ABNORMAL LOW (ref >60)
GFR calc non Af Amer: 28 mL/min — ABNORMAL LOW (ref >60)
GLUCOSE: 188 mg/dL — AB (ref 65–99)
Potassium: 4 mmol/L (ref 3.5–5.1)
Sodium: 129 mmol/L — ABNORMAL LOW (ref 135–145)

## 2016-11-14 LAB — CBC
HCT: 28.9 % — ABNORMAL LOW (ref 36.0–46.0)
HEMOGLOBIN: 9 g/dL — AB (ref 12.0–15.0)
MCH: 25.2 pg — AB (ref 26.0–34.0)
MCHC: 31.1 g/dL (ref 30.0–36.0)
MCV: 81 fL (ref 78.0–100.0)
Platelets: 266 10*3/uL (ref 150–400)
RBC: 3.57 MIL/uL — ABNORMAL LOW (ref 3.87–5.11)
RDW: 17.8 % — ABNORMAL HIGH (ref 11.5–15.5)
WBC: 8.1 10*3/uL (ref 4.0–10.5)

## 2016-11-14 LAB — MAGNESIUM: MAGNESIUM: 2.2 mg/dL (ref 1.7–2.4)

## 2016-11-14 MED ORDER — METHYLPREDNISOLONE SODIUM SUCC 40 MG IJ SOLR
40.0000 mg | Freq: Two times a day (BID) | INTRAMUSCULAR | Status: DC
  Administered 2016-11-14 – 2016-11-15 (×2): 40 mg via INTRAVENOUS
  Filled 2016-11-14 (×2): qty 1

## 2016-11-14 MED ORDER — SODIUM CHLORIDE 0.9 % IV SOLN
INTRAVENOUS | Status: DC
  Administered 2016-11-14 – 2016-11-15 (×2): via INTRAVENOUS

## 2016-11-14 NOTE — Care Management Important Message (Signed)
Important Message  Patient Details  Name: Lindsay Salazar MRN: [id number] Date of Birth: May 17, 1950   Medicare Important Message Given:  Yes    Sherald Barge, RN 11/14/2016, 10:45 AM

## 2016-11-14 NOTE — Progress Notes (Signed)
[facility]  PROGRESS NOTE  Lindsay Salazar WIO:[id number] DOB: 1950-08-22 DOA: 11/11/2016 PCP: Vesta Mixer   Brief HPI:   66 year old female with pancreatic adenocarcinoma (not considered a candidate for surgery due to her poor social status and poor outpatient follow-up, last chemotherapy in April 2018), COPD, fibromyalgia, GERD, hypertension, chronic A. fib on anticoagulation presented to the ED with 3 day history of progressive shortness of breath associated with productive cough and right lower quadrant abdominal pain. Patient was found to be hypoxic with O2 sat of 82% on room air in the ED and chest x-ray showing pulmonary vascular congestion. CT of the abdomen with renal protocol showed prominence of the right ureter and inflammation with a 3 mm renal stone in her bladder consistent with the past your left. Patient was also found to be in rapid A. fib. Patient admitted for acute COPD exacerbation/acute on chronic diastolic CHF and rapid A. fib.     Subjective   Patient seen and examined, breathing is improved. Echocardiogram shows EF 55-60%, mild right atrial and ventricular dilation, PA pressure 50 mmHg.   Assessment/Plan:     1. COPD exacerbation-improving, Will change  Solu-Medrol to 40 mg  every 12 hours, DuoNeb nebulizers. 2. Mild right heart failure-patient was initially started on Lasix 40 mg IV twice a day then change to 40 mg IV daily. At this time she is euvolemic and will discontinue Lasix due to worsening renal function. 3. Acute kidney injury- from diuresis. The BUN/creatinine is elevated 25/1.80, patient's baseline creatinine 1.16. Will discontinue IV Lasix. Start gentle IV hydration normal saline at 75 ML per hour for 12 hours. Follow BMP in am. 4. Hyponatremia- sodium was 129, likely from dehydration from diuresis. Diuretics have been discontinued. Started on gentle IV hydration. Follow BMP in am. 5. Hyperglycemia- secondary to steroids. Dose of  Solu-Medrol has been changed. Continue sliding scale insulin with NovoLog. 6. Atrial fibrillation with RVR-heart rate is controlled, continue beta blocker, Cardizem. Anti-coagulation with Xarelto. 7. Pancreatic cancer- patient has history of pancreatic adenocarcinoma, last chemotherapy was in April. Patient also to surgery for distal pancreatectomy. And she was deemed not a candidate for chemotherapy due to poor social environment and lack of consistent follow-up. 8. Anemia of chronic disease-Patient is status post 1 unit PRBC, today hemoglobin is 9.0 9. Hypertension-blood pressure stable, continue home medications. 10. Ureterolithiasis-patient likely has passed right renal stone as per CT scan. She has 3 mm stone at the base of bladder. No hydronephrosis. 11. Hypomagnesemia - replete, magnesium of 1.5 which was replaced, today magnesium is 2.2.    DVT prophylaxis: SCDs  Code Status: Full code  Family Communication: No family at bedside   Disposition Plan: Likely home after renal function improves   Consultants:  None  Procedures:  None  Continuous infusions . sodium chloride        Antibiotics:   Anti-infectives    None       Objective   Vitals:   11/13/16 2112 11/14/16 0247 11/14/16 0502 11/14/16 0738  BP: 104/68  125/77   Pulse: 65  68   Resp:   18   Temp: 98.2 F (36.8 C)  98.4 F (36.9 C)   TempSrc: Oral  Oral   SpO2: 98% 96% 94% 90%  Weight:   64.2 kg (141 lb 9.6 oz)   Height:        Intake/Output Summary (Last 24 hours) at 11/14/16 1225 Last data filed at 11/14/16 0900  Gross per  24 hour  Intake              240 ml  Output              400 ml  Net             -160 ml   Filed Weights   11/11/16 0552 11/12/16 0641 11/14/16 0502  Weight: 63.5 kg (140 lb 1.6 oz) 63 kg (139 lb) 64.2 kg (141 lb 9.6 oz)     Physical Examination:   Physical Exam: Eyes: No icterus, extraocular muscles intact  Mouth: Oral mucosa is moist, no lesions on palate,   Neck: Supple, no deformities, masses, or tenderness Lungs: Normal respiratory effort, bilateral clear to auscultation, no crackles or wheezes.  Heart: Regular rate and rhythm, S1 and S2 normal, no murmurs, rubs auscultated Abdomen: BS normoactive,soft,nondistended,non-tender to palpation,no organomegaly Extremities: No pretibial edema, no erythema, no cyanosis, no clubbing Neuro : Alert and oriented to time, place and person, No focal deficits Skin: No rashes seen on exam    Data Reviewed: I have personally reviewed following labs and imaging studies  CBG:  Recent Labs Lab 11/13/16 1131 11/13/16 1720 11/13/16 2023 11/14/16 0714 11/14/16 1100  GLUCAP 155* 157* 160* 186* 250*    CBC:  Recent Labs Lab 11/11/16 0043 11/11/16 1359 11/12/16 0417 11/12/16 1433 11/13/16 0622 11/14/16 0519  WBC 9.4  --  6.1  --  8.1 8.1  NEUTROABS 6.9  --  5.0  --   --   --   HGB 7.5* 7.9* 7.0* 8.6* 8.6* 9.0*  HCT 24.8* 26.3* 22.7* 27.4* 27.5* 28.9*  MCV 83.8  --  81.1  --  79.7 81.0  PLT 253  --  244  --  256 952    Basic Metabolic Panel:  Recent Labs Lab 11/11/16 0043 11/11/16 1359 11/12/16 0417 11/12/16 0800 11/13/16 0622 11/14/16 0519  NA 139 137 136  --  132* 129*  K 4.2 4.1 3.3*  --  4.1 4.0  CL 108 102 100*  --  95* 93*  CO2 23 22 24   --  26 26  GLUCOSE 106* 121* 170*  --  155* 188*  BUN 19 19 27*  --  36* 55*  CREATININE 1.16* 1.35* 1.47*  --  1.34* 1.80*  CALCIUM 8.8* 8.7* 8.3*  --  8.1* 7.9*  MG 1.6*  --   --  1.5*  --  2.2  PHOS 3.2  --   --   --   --   --     No results found for this or any previous visit (from the past 240 hour(s)).   Liver Function Tests:  Recent Labs Lab 11/11/16 0043  AST 17  ALT 11*  ALKPHOS 83  BILITOT 0.4  PROT 6.3*  ALBUMIN 3.0*    Recent Labs Lab 11/11/16 0043  LIPASE 23   No results for input(s): AMMONIA in the last 168 hours.  Cardiac Enzymes: No results for input(s): CKTOTAL, CKMB, CKMBINDEX, TROPONINI in the  last 168 hours. BNP (last 3 results)  Recent Labs  06/08/16 1140 08/15/16 1602 11/11/16 0043  BNP 876.0* 625.0* 1,319.0*    ProBNP (last 3 results) No results for input(s): PROBNP in the last 8760 hours.    Studies: No results found.  Scheduled Meds: . diltiazem  240 mg Oral Daily  . docusate sodium  100 mg Oral BID  . escitalopram  10 mg Oral Daily  . guaiFENesin  1,200 mg Oral  BID  . insulin aspart  0-15 Units Subcutaneous TID WC  . ipratropium  0.5 mg Nebulization TID  . levalbuterol  1.25 mg Nebulization TID  . mouth rinse  15 mL Mouth Rinse BID  . methylPREDNISolone (SOLU-MEDROL) injection  60 mg Intravenous Q12H  . metoprolol tartrate  50 mg Oral BID  . pantoprazole  40 mg Oral Daily  . rivaroxaban  20 mg Oral Daily      Time spent: 25 min  [facility] Pager [phone]. If 7PM-7AM, please contact night-coverage at [email or web address], Office  [phone]  password [email or web address]  11/14/2016, 12:25 PM  LOS: 3 days

## 2016-11-15 DIAGNOSIS — J9621 Acute and chronic respiratory failure with hypoxia: Secondary | ICD-10-CM

## 2016-11-15 DIAGNOSIS — N170 Acute kidney failure with tubular necrosis: Secondary | ICD-10-CM

## 2016-11-15 LAB — GLUCOSE, CAPILLARY
GLUCOSE-CAPILLARY: 148 mg/dL — AB (ref 65–99)
GLUCOSE-CAPILLARY: 213 mg/dL — AB (ref 65–99)
GLUCOSE-CAPILLARY: 220 mg/dL — AB (ref 65–99)

## 2016-11-15 LAB — BASIC METABOLIC PANEL
ANION GAP: 8 (ref 5–15)
BUN: 50 mg/dL — ABNORMAL HIGH (ref 6–20)
CO2: 27 mmol/L (ref 22–32)
Calcium: 7.6 mg/dL — ABNORMAL LOW (ref 8.9–10.3)
Chloride: 97 mmol/L — ABNORMAL LOW (ref 101–111)
Creatinine, Ser: 1.29 mg/dL — ABNORMAL HIGH (ref 0.44–1.00)
GFR, EST AFRICAN AMERICAN: 48 mL/min — AB (ref >60)
GFR, EST NON AFRICAN AMERICAN: 42 mL/min — AB (ref >60)
GLUCOSE: 153 mg/dL — AB (ref 65–99)
POTASSIUM: 4.4 mmol/L (ref 3.5–5.1)
SODIUM: 132 mmol/L — AB (ref 135–145)

## 2016-11-15 MED ORDER — DILTIAZEM HCL ER COATED BEADS 240 MG PO CP24: 240.0000 mg | ORAL_CAPSULE | Freq: Every day | ORAL | 0 refills | Status: AC

## 2016-11-15 MED ORDER — PREDNISONE 20 MG PO TABS: 20.0000 mg | ORAL_TABLET | Freq: Every day | ORAL | 0 refills | Status: AC

## 2016-11-15 NOTE — Discharge Summary (Addendum)
Physician Discharge Summary  Prudie Guthridge [facility] JOA:[id number] DOB: [birth year] DOA: 11/11/2016  PCP: [name], PA-C  Admit date: 11/11/2016 Discharge date: 11/15/2016  Admitted From:Home Disposition:  Home  Recommendations for Outpatient Follow-up:  1. Follow up with PCP in 1-2 weeks 2. Patient being discharged on oral prednisone taper over the next 12 days  Home Health: None Equipment/Devices: Chronic 2 L oxygen via nasal cannula   Discharge Condition: Fair CODE STATUS: Full code Diet recommendation: Heart Healthy    Discharge Diagnoses:  Principal Problem:   Acute exacerbation of chronic obstructive pulmonary disease (COPD) ([place]) Improved with IV Solu-Medrol, scheduled DuoNeb's and antitussives. We will discharge her on oral prednisone taper and instructed to continue her home inhaler and nebs.    Active Problems:   Atrial fibrillation with RVR ([facility]) Rate controlled with beta blocker and Cardizem. Continue Xarelto.    Acute on chronic respiratory failure with hypoxia [facility]) Patient had increased oxygen requirement on presentation.Secondary to COPD exacerbation and possible acute diastolic CHF/right-sided heart failure. Currently improved. Stable on 2 L home O2.  Acute diastolic CHF right-sided heart failure/ acute cor pulmonale 2-D echo with elevated PA pressure 50 mmHg with a mildly dilated right ventricle suggestive of possible cor pulmonale with associated COPD.Marland Kitchen Continue home dose Lasix, beta blocker.   Hyponatremia Possibly some dehydration with over diuresis. Now resolved.     Pancreatic cancer [facility]) patient has history of pancreatic adenocarcinoma, last chemotherapy was in April. Patient seen by surgery (Dr. Barry Dienes) for distal pancreatectomy. she was deemed not a candidate for chemotherapy due to poor social environment and lack of consistent follow-up. Patient was seen by palliative care consult during last hospitalization few months ago and wanted full scope of  treatment.    Anemia of chronic disease Baseline hemoglobin around 9. Drop to 7 and improved with 1 unit PRBC transfused. Stool for Hemoccult was negative.     Essential hypertension Stable. Continue home medications.    Acute renal failure superimposed on stage 3 chronic kidney disease ([place]) Secondary to diuresis. Resolved after holding Lasix and some IV fluids. Outpatient follow-up.   Chronic back and hip pain Resume home pain medications.  Urolithiasis CT on admission showed 3 mm stone at the base of the bladder and likely passed right renal stone. No signs of hydronephrosis or urinary symptoms.  Hypomagnesemia Replenish  Consults: None Procedure: 2-D echo, CT abdomen  Family communication: None at bedside  Disposition: Home  Discharge Instructions   Allergies as of 11/15/2016      Reactions   Ibuprofen Nausea And Vomiting   Other    Seafood   Penicillins Swelling   Has patient had a PCN reaction causing immediate rash, facial/tongue/throat swelling, SOB or lightheadedness with hypotension: Yes Has patient had a PCN reaction causing severe rash involving mucus membranes or skin necrosis: No Has patient had a PCN reaction that required hospitalization No Has patient had a PCN reaction occurring within the last 10 years: No If all of the above answers are "NO", then may proceed with Cephalosporin use.   Tiotropium Bromide Monohydrate Itching   Tramadol Nausea And Vomiting      Medication List    TAKE these medications   diltiazem 240 MG 24 hr capsule Commonly known as:  CARDIZEM CD Take 1 capsule (240 mg total) by mouth daily.   docusate sodium 100 MG capsule Commonly known as:  COLACE Take 1 capsule (100 mg total) by mouth 2 (two) times daily.   ergocalciferol 50000  units capsule Commonly known as:  VITAMIN D2 Take 1 capsule (50,000 Units total) by mouth once a week.   escitalopram 10 MG tablet Commonly known as:  LEXAPRO Take 1 tablet by mouth  daily.   furosemide 40 MG tablet Commonly known as:  LASIX Take 1 tablet (40 mg total) by mouth daily. What changed:  when to take this   guaiFENesin 600 MG 12 hr tablet Commonly known as:  MUCINEX Take 2 tablets (1,200 mg total) by mouth 2 (two) times daily.   HYDROcodone-acetaminophen 5-325 MG tablet Commonly known as:  NORCO/VICODIN Take 1 tablet by mouth every 4 (four) hours as needed.   hydrOXYzine 50 MG tablet Commonly known as:  ATARAX/VISTARIL Take 50 mg by mouth 2 (two) times daily as needed for anxiety.   ipratropium-albuterol 0.5-2.5 (3) MG/3ML Soln Commonly known as:  DUONEB Inhale 3 mLs into the lungs every 6 (six) hours as needed (for shortness of breath).   COMBIVENT RESPIMAT 20-100 MCG/ACT Aers respimat Generic drug:  Ipratropium-Albuterol Inhale 1 puff into the lungs every 6 (six) hours as needed for wheezing.   levalbuterol 1.25 MG/3ML nebulizer solution Commonly known as:  XOPENEX Take 1.25 mg by nebulization every 4 (four) hours as needed for wheezing.   meclizine 25 MG tablet Commonly known as:  ANTIVERT Take 1 tablet (25 mg total) by mouth 3 (three) times daily as needed for dizziness.   metoprolol tartrate 50 MG tablet Commonly known as:  LOPRESSOR Take 1 tablet (50 mg total) by mouth 2 (two) times daily.   OXYGEN Inhale 2 L into the lungs daily as needed (for breathing).   pantoprazole 40 MG tablet Commonly known as:  PROTONIX Take 1 tablet (40 mg total) by mouth daily.   potassium chloride SA 20 MEQ tablet Commonly known as:  K-DUR,KLOR-CON Take 1 tablet (20 mEq total) by mouth daily.   predniSONE 20 MG tablet Commonly known as:  DELTASONE Take 1 tablet (20 mg total) by mouth daily with breakfast.   SYMBICORT 160-4.5 MCG/ACT inhaler Generic drug:  budesonide-formoterol Inhale 1 puff into the lungs 2 (two) times daily.   temazepam 15 MG capsule Commonly known as:  RESTORIL Take 15 mg by mouth at bedtime.   XARELTO 20 MG Tabs  tablet Generic drug:  rivaroxaban Take 1 tablet by mouth daily.      Follow-up Information    [name], PA-C Follow up in 1 week(s).   Specialty:  Physician Assistant Contact information: [address] [place] [address] [phone]          Allergies  Allergen Reactions  . Ibuprofen Nausea And Vomiting  . Other     Seafood   . Penicillins Swelling    Has patient had a PCN reaction causing immediate rash, facial/tongue/throat swelling, SOB or lightheadedness with hypotension: Yes Has patient had a PCN reaction causing severe rash involving mucus membranes or skin necrosis: No Has patient had a PCN reaction that required hospitalization No Has patient had a PCN reaction occurring within the last 10 years: No If all of the above answers are "NO", then may proceed with Cephalosporin use.   . Tiotropium Bromide Monohydrate Itching  . Tramadol Nausea And Vomiting      Procedures/Studies: Dg Chest Portable 1 View  Result Date: 11/11/2016 CLINICAL DATA:  Shortness of breath, cough, RIGHT lower abdominal pain, history COPD, respiratory failure, bronchitis, hypertension, paroxysmal atrial fibrillation, former smoker EXAM: PORTABLE CHEST 1 VIEW COMPARISON:  Portable exam 0023  hours compared to 09/02/2016 FINDINGS: RIGHT jugular Port-A-Cath with tip projecting over cavoatrial junction. Upper normal size of cardiac silhouette with pulmonary vascular congestion. Atherosclerotic calcification aorta. Emphysematous changes with interstitial prominence increased since previous exam question minimal edema or infection. No gross pleural effusion or pneumothorax. Bones demineralized. IMPRESSION: Pulmonary vascular congestion with increased interstitial prominence since the previous exam question mild pulmonary edema or infection. Underlying emphysematous changes. Electronically Signed   By: Lavonia Dana M.D.   On: 11/11/2016 00:55   Ct Renal Stone Study  Result Date:  11/11/2016 CLINICAL DATA:  Acute onset of left flank pain. Hematuria. Initial encounter. EXAM: CT ABDOMEN AND PELVIS WITHOUT CONTRAST TECHNIQUE: Multidetector CT imaging of the abdomen and pelvis was performed following the standard protocol without IV contrast. COMPARISON:  CT of the abdomen performed 07/31/2016, and renal ultrasound performed 08/11/2016 FINDINGS: Lower chest: Small bilateral pleural effusions are noted. Mild bibasilar interstitial prominence may reflect interstitial edema. The visualized portions of the mediastinum are unremarkable. Hepatobiliary: A large 6.9 cm cyst is noted at the left hepatic lobe. The liver is otherwise grossly unremarkable. The gallbladder is within normal limits. The common bile duct remains normal in caliber. Pancreas: The known mass at the body of the pancreas may have increased in size, perhaps measuring 3.7 cm in length, though not well assessed without contrast. There is underlying diffuse dilatation of the pancreatic duct at the pancreatic tail. Spleen: The spleen is unremarkable in appearance. Adrenals/Urinary Tract: The adrenal glands are unremarkable in appearance. Nonobstructing right renal stones measure up to 3 mm in size. Nonspecific perinephric stranding is seen bilaterally. A small right renal cyst is noted. There is no evidence of hydronephrosis. No renal or ureteral stones are identified. There is asymmetric prominence of the right ureter along its entire course, with surrounding soft tissue inflammation. A 3 mm stone is noted at the base of the bladder. This likely reflects a recently passed stone. Stomach/Bowel: The stomach is unremarkable in appearance. The small bowel is within normal limits. The appendix is normal in caliber, without evidence of appendicitis. The colon is unremarkable in appearance. Vascular/Lymphatic: Diffuse calcification is seen along the abdominal aorta and its branches. The abdominal aorta is otherwise grossly unremarkable. The  inferior vena cava is grossly unremarkable. No retroperitoneal lymphadenopathy is seen. No pelvic sidewall lymphadenopathy is identified. Reproductive: The bladder is mildly distended and otherwise unremarkable. The uterus is unremarkable in appearance. Trace free fluid is noted within the pelvis. No suspicious adnexal masses are seen. The ovaries are grossly symmetric. Other: A small umbilical hernia is noted, containing only fat. Musculoskeletal: No acute osseous abnormalities are identified. Vacuum phenomenon is noted at L5-S1. The visualized musculature is unremarkable in appearance. IMPRESSION: 1. Asymmetric prominence of the right ureter along its entire course, with surrounding soft tissue inflammation. 3 mm stone at the base of the bladder. This likely reflects a recently passed stone. No evidence of hydronephrosis at this time. 2. Nonobstructing right renal stones measure up to 3 mm in size. Small right renal cyst noted. 3. Small bilateral pleural effusions. Mild bibasilar interstitial prominence may reflect interstitial edema. 4. Known mass at the body of the pancreas may have increased in size since the prior study, now measuring 3.7 cm, though not well characterized without contrast. Underlying diffuse dilatation of the pancreatic duct at the pancreatic tail. 5. Large hepatic cyst again noted. 6. Diffuse aortic atherosclerosis. 7. Small umbilical hernia, containing only fat. Electronically Signed   By: Francoise Schaumann.D.  On: 11/11/2016 03:07    2-D echo Study Conclusions  - Left ventricle: The cavity size was normal. Wall thickness was   normal. Systolic function was normal. The estimated ejection   fraction was in the range of 55% to 60%. Left ventricular   diastolic function parameters were normal. - Mitral valve: Calcified annulus. Mildly thickened leaflets . - Right ventricle: The cavity size was mildly dilated. - Right atrium: The atrium was mildly dilated. - Atrial septum: No  defect or patent foramen ovale was identified. - Tricuspid valve: There was mild-moderate regurgitation. - Pulmonary arteries: PA peak pressure: 50 mm Hg (S).   Subjective: Views of breathing to be better. No overnight events.  Discharge Exam: Vitals:   11/15/16 0448 11/15/16 0756  BP: 139/88   Pulse: (!) 55   Resp: 20   Temp: 97.9 F (36.6 C)   SpO2: 100% 99%   Vitals:   11/14/16 1942 11/14/16 1954 11/15/16 0448 11/15/16 0756  BP:  125/68 139/88   Pulse:  78 (!) 55   Resp:  20 20   Temp:  98.1 F (36.7 C) 97.9 F (36.6 C)   TempSrc:  Oral Oral   SpO2: 98% 98% 100% 99%  Weight:   68 kg (150 lb)   Height:        General: Elderly female not in distress HEENT: Pallor present, moist mucosa, supple neck Chest: Clear to auscultation bilaterally, no added sounds CVS: S1-S2. Irregular, no murmurs rub or gallop GI: Soft, nondistended, nontender  Musculoskeletal: Warm, no edema    The results of significant diagnostics from this hospitalization (including imaging, microbiology, ancillary and laboratory) are listed below for reference.     Microbiology: No results found for this or any previous visit (from the past 240 hour(s)).   Labs: BNP (last 3 results)  Recent Labs  06/08/16 1140 08/15/16 1602 11/11/16 0043  BNP 876.0* 625.0* 9,381.0*   Basic Metabolic Panel:  Recent Labs Lab 11/11/16 0043 11/11/16 1359 11/12/16 0417 11/12/16 0800 11/13/16 0622 11/14/16 0519 11/15/16 0657  NA 139 137 136  --  132* 129* 132*  K 4.2 4.1 3.3*  --  4.1 4.0 4.4  CL 108 102 100*  --  95* 93* 97*  CO2 23 22 24   --  26 26 27   GLUCOSE 106* 121* 170*  --  155* 188* 153*  BUN 19 19 27*  --  36* 55* 50*  CREATININE 1.16* 1.35* 1.47*  --  1.34* 1.80* 1.29*  CALCIUM 8.8* 8.7* 8.3*  --  8.1* 7.9* 7.6*  MG 1.6*  --   --  1.5*  --  2.2  --   PHOS 3.2  --   --   --   --   --   --    Liver Function Tests:  Recent Labs Lab 11/11/16 0043  AST 17  ALT 11*  ALKPHOS 83  BILITOT  0.4  PROT 6.3*  ALBUMIN 3.0*    Recent Labs Lab 11/11/16 0043  LIPASE 23   No results for input(s): AMMONIA in the last 168 hours. CBC:  Recent Labs Lab 11/11/16 0043 11/11/16 1359 11/12/16 0417 11/12/16 1433 11/13/16 0622 11/14/16 0519  WBC 9.4  --  6.1  --  8.1 8.1  NEUTROABS 6.9  --  5.0  --   --   --   HGB 7.5* 7.9* 7.0* 8.6* 8.6* 9.0*  HCT 24.8* 26.3* 22.7* 27.4* 27.5* 28.9*  MCV 83.8  --  81.1  --  79.7 81.0  PLT 253  --  244  --  256 266   Cardiac Enzymes: No results for input(s): CKTOTAL, CKMB, CKMBINDEX, TROPONINI in the last 168 hours. BNP: Invalid input(s): POCBNP CBG:  Recent Labs Lab 11/14/16 0714 11/14/16 1100 11/14/16 1612 11/14/16 1958 11/15/16 0744  GLUCAP 186* 250* 157* 189* 148*   D-Dimer No results for input(s): DDIMER in the last 72 hours. Hgb A1c No results for input(s): HGBA1C in the last 72 hours. Lipid Profile No results for input(s): CHOL, HDL, LDLCALC, TRIG, CHOLHDL, LDLDIRECT in the last 72 hours. Thyroid function studies No results for input(s): TSH, T4TOTAL, T3FREE, THYROIDAB in the last 72 hours.  Invalid input(s): FREET3 Anemia work up No results for input(s): VITAMINB12, FOLATE, FERRITIN, TIBC, IRON, RETICCTPCT in the last 72 hours. Urinalysis    Component Value Date/Time   COLORURINE YELLOW 11/11/2016 0103   APPEARANCEUR CLEAR 11/11/2016 0103   LABSPEC 1.014 11/11/2016 0103   PHURINE 5.0 11/11/2016 0103   GLUCOSEU NEGATIVE 11/11/2016 0103   HGBUR LARGE (A) 11/11/2016 0103   BILIRUBINUR NEGATIVE 11/11/2016 0103   KETONESUR NEGATIVE 11/11/2016 0103   PROTEINUR NEGATIVE 11/11/2016 0103   NITRITE NEGATIVE 11/11/2016 0103   LEUKOCYTESUR SMALL (A) 11/11/2016 0103   Sepsis Labs Invalid input(s): PROCALCITONIN,  WBC,  LACTICIDVEN Microbiology No results found for this or any previous visit (from the past 240 hour(s)).   Time coordinating discharge: Over 30 minutes  SIGNED:   Louellen Molder, MD  [facility] 11/15/2016, 11:09 AM Pager   If 7PM-7AM, please contact night-coverage [email or web address] Password [email or web address]

## 2016-11-17 ENCOUNTER — Ambulatory Visit (HOSPITAL_COMMUNITY): Admission: RE | Admit: 2016-11-17 | Payer: Medicare HMO | Source: Ambulatory Visit

## 2016-11-18 ENCOUNTER — Ambulatory Visit (HOSPITAL_COMMUNITY): Payer: Self-pay

## 2016-11-27 ENCOUNTER — Ambulatory Visit (HOSPITAL_COMMUNITY)
Admission: RE | Admit: 2016-11-27 | Discharge: 2016-11-27 | Disposition: A | Discharge status: Home / Self Care | Payer: Medicare HMO | Source: Ambulatory Visit | Attending: Oncology | Admitting: Oncology

## 2016-11-27 DIAGNOSIS — I700 Atherosclerosis of aorta: Secondary | ICD-10-CM | POA: Insufficient documentation

## 2016-11-27 DIAGNOSIS — R590 Localized enlarged lymph nodes: Secondary | ICD-10-CM | POA: Insufficient documentation

## 2016-11-27 DIAGNOSIS — J90 Pleural effusion, not elsewhere classified: Secondary | ICD-10-CM | POA: Diagnosis not present

## 2016-11-27 DIAGNOSIS — I517 Cardiomegaly: Secondary | ICD-10-CM | POA: Diagnosis not present

## 2016-11-27 DIAGNOSIS — J439 Emphysema, unspecified: Secondary | ICD-10-CM | POA: Insufficient documentation

## 2016-11-27 DIAGNOSIS — C259 Malignant neoplasm of pancreas, unspecified: Secondary | ICD-10-CM | POA: Diagnosis not present

## 2016-11-27 MED ORDER — IOPAMIDOL (ISOVUE-300) INJECTION 61%
100.0000 mL | Freq: Once | INTRAVENOUS | Status: AC | PRN
  Administered 2016-11-27: 100 mL via INTRAVENOUS

## 2016-12-02 ENCOUNTER — Encounter (HOSPITAL_COMMUNITY): Payer: Medicare HMO | Attending: Hematology & Oncology | Admitting: Adult Health

## 2016-12-02 ENCOUNTER — Encounter (HOSPITAL_COMMUNITY): Payer: Self-pay | Admitting: Adult Health

## 2016-12-02 VITALS — BP 132/88 | HR 85 | Temp 97.8°F | Resp 20 | Wt 139.2 lb

## 2016-12-02 DIAGNOSIS — Z86718 Personal history of other venous thrombosis and embolism: Secondary | ICD-10-CM

## 2016-12-02 DIAGNOSIS — C251 Malignant neoplasm of body of pancreas: Secondary | ICD-10-CM | POA: Diagnosis not present

## 2016-12-02 DIAGNOSIS — R978 Other abnormal tumor markers: Secondary | ICD-10-CM | POA: Diagnosis not present

## 2016-12-02 DIAGNOSIS — Z7901 Long term (current) use of anticoagulants: Secondary | ICD-10-CM

## 2016-12-02 DIAGNOSIS — J449 Chronic obstructive pulmonary disease, unspecified: Secondary | ICD-10-CM | POA: Diagnosis not present

## 2016-12-02 DIAGNOSIS — R52 Pain, unspecified: Secondary | ICD-10-CM | POA: Diagnosis not present

## 2016-12-02 DIAGNOSIS — R197 Diarrhea, unspecified: Secondary | ICD-10-CM

## 2016-12-02 DIAGNOSIS — C259 Malignant neoplasm of pancreas, unspecified: Secondary | ICD-10-CM

## 2016-12-02 MED ORDER — DIPHENOXYLATE-ATROPINE 2.5-0.025 MG PO TABS: 1.0000 | ORAL_TABLET | Freq: Four times a day (QID) | ORAL | 0 refills | Status: AC | PRN

## 2016-12-02 NOTE — Progress Notes (Signed)
[place], [place] [address]   CLINIC:  Medical Oncology/Hematology  PCP:  Vesta Mixer [address] [place] [address] [phone]   REASON FOR VISIT:  Follow-up for Stage IB (T2N0M0) adenocarcinoma of pancreas   CURRENT THERAPY: Not surgical candidate given lung disease; not chemo candidate given multiple hospitalizations and non-adherence to clinic appointments     BRIEF ONCOLOGIC HISTORY:    Pancreatic cancer ([place])   11/29/2015 Procedure    EUS by Dr. Ardis Hughs- 4.2cm mass in the body of pancreas causing main pancreatic duct obstruction (and dilation) and focally involving the portosplenic vein confluence. The mass does not involve the SMA, celiac trunk.      11/30/2015 Pathology Results    FINE NEEDLE ASPIRATION, ENDOSCOPIC, PANCREAS BODY (SPECIMEN 1 OF 1 COLLECTED 11/30/15): MALIGNANT CELLS CONSISTENT WITH ADENOCARCINOMA.      12/18/2015 Procedure    Successful placement of right IJ approach port-a-cath with tip at the superior caval atrial junction by IR      12/19/2015 -  Chemotherapy    The patient had PACLitaxel-protein bound (ABRAXANE) chemo infusion 200 mg, 125 mg/m2 = 200 mg, Intravenous,  Once, 1 of 4 cycles  gemcitabine (GEMZAR) 1,634 mg in sodium chloride 0.9 % 250 mL chemo infusion, 1,000 mg/m2 = 1,634 mg, Intravenous,  Once, 1 of 4 cycles  for chemotherapy treatment.        12/19/2015 Tumor Marker    Patient's tumor was tested for the following markers: CA 19-9. Results of the tumor marker test revealed 197.      12/24/2015 Imaging    CT angio chest- 1. No evidence of pulmonary embolus. 2. Mild scattered bilateral peribronchovascular opacity within the lungs, more nodular at the right middle lobe, likely reflects an atypical infectious process.      01/20/2016 - 01/23/2016 Hospital Admission    Admit date: 01/20/2016 Admission diagnosis: COPD exacerbation Additional comments:       01/28/2016 - 02/07/2016 Hospital Admission    Admit date: 01/28/2016 Admission diagnosis: Atrial fibrillation with rapid ventricular response Additional comments: CHADS vasc score 3, likely secondary to hypoxia, On metoprolol and Cardizem, and on Xarelto for anticoagulation      02/17/2016 - 02/19/2016 Hospital Admission    Admit date: 02/17/2016 Admission diagnosis: .Bright red blood per rectum. Additional comments: Anoscopy performed.  Low volume hematochezia appears to be secondary to hemorrhoidal bleeding. Anoscopy in emergency room did not reveal evidence of gross bleeding. Nevertheless patient needs colonoscopy at some point as she has never undergone this exam in the past. Patient however is not agreeable to undergo colonoscopy during this admission but will consider at a later date.      03/19/2016 Miscellaneous    Patient seen by Dr. Barry Dienes for consideration of distal pancreatectomy.  She is arranged for patient to undergo clearance from a pulmonology and cardiology standpoint.  Once cleared, she will plan on taking patient to surgery.  In the interim, we will continue with neoadjuvant chemotherapy.      03/28/2016 - 04/04/2016 Hospital Admission    Admit date: 03/28/2016 Admission diagnosis: Acute on chronic respiratory failure, COPD exacerbation       04/26/2016 - 05/01/2016 Hospital Admission    Admit date: 04/26/16 Admission diagnosis: Right LE cellulitis       05/08/2016 - 05/12/2016 Hospital Admission    Admit date: 05/08/16 Admission diagnosis: COPD exacerbation        05/15/2016 Treatment Plan Change  Remainder of cycle #5 (days 8 & 15 of cycle) discontinued given multiple hospitalizations.  Proceed with cycle #6 Gemzar/Abraxane today.         INTERVAL HISTORY:  Ms. Soave 66 y.o. female returns for follow-up for pancreatic cancer.   She has known history of non-adherence to our medical recommendations.  She also has known significant lung disease and is frequently  hospitalized.  Since her last visit to the cancer center on 08/05/16, she has had 4 ED visits and 3 hospitalizations for A-fib, shortness of breath, COPD exacerbation, and ureteral stone.   Most recent hospitalization was 11/11/16-11/15/16.   She is requesting pain medications today.  We have relayed to Kath[place]nn in the past that we will no longer provide pain medications for her because she is not adherent to our recommendations and does not consistently come to cancer center.  Also, her chronic pain is not likely be secondary to her cancer.  Her last chemo was 05/15/16; she received Gemzar/Abraxane.  Her last CA 19-9 was markedly elevated at 369 in 10/2016. Restaging CT imaging was done recently and she would like to review those results today.    She reports a myriad of complaints today including: fatigue/weakness, cough/chest congestion, shortness of breath, mouth sores/ulcerations, leg swelling, diarrhea "all the time", extreme bruising, and not sleeping well.  She wears O2 at times at home, but only periodically.    Appetite is good at 75%; energy levels are reportedly good as well at 75%.    Here today with her boyfriend. She is seen seated in a wheelchair.     REVIEW OF SYSTEMS:  Review of Systems - Oncology, per HPI.    PAST MEDICAL/SURGICAL HISTORY:  Past Medical History:  Diagnosis Date  . Anemia of chronic disease   . Arthritis    bursitis , fibromyalgia  . Arthritis    "right hip" (11/27/2015)  . Asthma   . Chronic bronchitis ([place])   . Chronic respiratory failure ([place])   . Chronic right hip pain   . COPD (chronic obstructive pulmonary disease) ([place])    wears home o2 prn  . Daily headache    "last 2-3 months" (11/27/2015)  . DVT (deep venous thrombosis) ([place]) 03/2016  . Exposure to TB    "mom had it when she was pregnant with me"  . Fibromyalgia   . GERD (gastroesophageal reflux disease)    use to have it but not now  . Heart murmur    13-  86 years old  . Hyperkalemia   .  Hypertension   . On home oxygen therapy    11/27/2015 "whenever I need it; don't know how many liters"  . PAF (paroxysmal atrial fibrillation) ([place])   . Pancreatic cancer ([place])   . Paroxysmal atrial flutter ([place])   . Pneumonia    "several times" (11/27/2015)  . Pneumothorax    Past Surgical History:  Procedure Laterality Date  . ANTERIOR CERVICAL DECOMP/DISCECTOMY FUSION     "put 4 screws in"  . CARDIAC CATHETERIZATION  08/2004   Archie Endo 06/18/2010  . CARDIOVERSION N/A 07/18/2015   Procedure: CARDIOVERSION;  Surgeon: Josue Hector, MD;  Location: [facility];  Service: Cardiovascular;  Laterality: N/A;  . ESOPHAGOGASTRODUODENOSCOPY N/A 08/09/2015   Procedure: ESOPHAGOGASTRODUODENOSCOPY (EGD);  Surgeon: Rogene Houston, MD;  Location: [facility];  Service: Endoscopy;  Laterality: N/A;  . EUS N/A 11/29/2015   Procedure: UPPER ENDOSCOPIC ULTRASOUND (EUS) RADIAL;  Surgeon: Milus Banister, MD;  Location:  [facility] END[facility];  Service: Endoscopy;  Laterality: N/A;  . IR GENERIC HISTORICAL  12/18/2015   IR US GUIDE VASC ACCESS RIGHT 12/18/2015 Sandi Mariscal, MD [facility]  . IR GENERIC HISTORICAL  12/18/2015   IR FLUORO GUIDE PORT INSERTION RIGHT 12/18/2015 Sandi Mariscal, MD [facility]  . TONSILLECTOMY    . TUBAL LIGATION       SOCIAL HISTORY:  Social History   Social History  . Marital status: Divorced    Spouse name: N/A  . Number of children: N/A  . Years of education: N/A   Occupational History  . retired    Social History Main Topics  . Smoking status: Former Smoker    Packs/day: 0.50    Years: 46.00    Types: Cigarettes    Start date: 10/09/1969    Quit date: 06/02/2015  . Smokeless tobacco: Never Used  . Alcohol use No     Comment: quit 30 years ago  . Drug use: No  . Sexual activity: Not Currently    Birth control/ protection: Surgical, Post-menopausal   Other Topics Concern  . Not on file   Social History Narrative  . No narrative on file    FAMILY HISTORY:    Family History  Problem Relation Age of Onset  . COPD Mother   . Diabetes Father   . Stroke Father     CURRENT MEDICATIONS:  Outpatient Encounter Prescriptions as of 12/02/2016  Medication Sig Note  . COMBIVENT RESPIMAT 20-100 MCG/ACT AERS respimat Inhale 1 puff into the lungs every 6 (six) hours as needed for wheezing.   . diltiazem (CARDIZEM CD) 240 MG 24 hr capsule Take 1 capsule (240 mg total) by mouth daily.   Marland Kitchen diltiazem (CARDIZEM CD) 240 MG 24 hr capsule Take 1 capsule (240 mg total) by mouth daily.   . diphenoxylate-atropine (LOMOTIL) 2.5-0.025 MG tablet Take 1 tablet by mouth 4 (four) times daily as needed for diarrhea or loose stools.   . docusate sodium (COLACE) 100 MG capsule Take 1 capsule (100 mg total) by mouth 2 (two) times daily.   . ergocalciferol (VITAMIN D2) 50000 units capsule Take 1 capsule (50,000 Units total) by mouth once a week.   . escitalopram (LEXAPRO) 10 MG tablet Take 1 tablet by mouth daily.   . furosemide (LASIX) 40 MG tablet Take 1 tablet (40 mg total) by mouth daily. (Patient taking differently: Take 40 mg by mouth every other day. )   . guaiFENesin (MUCINEX) 600 MG 12 hr tablet Take 2 tablets (1,200 mg total) by mouth 2 (two) times daily.   Marland Kitchen HYDROcodone-acetaminophen (NORCO/VICODIN) 5-325 MG tablet Take 1 tablet by mouth every 4 (four) hours as needed.   . hydrOXYzine (ATARAX/VISTARIL) 50 MG tablet Take 50 mg by mouth 2 (two) times daily as needed for anxiety.   Marland Kitchen ipratropium-albuterol (DUONEB) 0.5-2.5 (3) MG/3ML SOLN Inhale 3 mLs into the lungs every 6 (six) hours as needed (for shortness of breath).    Marland Kitchen levalbuterol (XOPENEX) 1.25 MG/3ML nebulizer solution Take 1.25 mg by nebulization every 4 (four) hours as needed for wheezing.   . meclizine (ANTIVERT) 25 MG tablet Take 1 tablet (25 mg total) by mouth 3 (three) times daily as needed for dizziness.   . metoprolol tartrate (LOPRESSOR) 50 MG tablet Take 1 tablet (50 mg total) by mouth 2 (two) times  daily. 11/11/2016: Patient mentions having issues getting this medication filled and states that she has 4 tablets remaining at home. Pharmacy records indicate last  filled 03/02/2016  . OXYGEN Inhale 2 L into the lungs daily as needed (for breathing).   . pantoprazole (PROTONIX) 40 MG tablet Take 1 tablet (40 mg total) by mouth daily.   . potassium chloride SA (K-DUR,KLOR-CON) 20 MEQ tablet Take 1 tablet (20 mEq total) by mouth daily.   . predniSONE (DELTASONE) 20 MG tablet Take 1 tablet (20 mg total) by mouth daily with breakfast.   . SYMBICORT 160-4.5 MCG/ACT inhaler Inhale 1 puff into the lungs 2 (two) times daily.   . temazepam (RESTORIL) 15 MG capsule Take 15 mg by mouth at bedtime.   Alveda Reasons 20 MG TABS tablet Take 1 tablet by mouth daily.    No facility-administered encounter medications on file as of 12/02/2016.     ALLERGIES:  Allergies  Allergen Reactions  . Ibuprofen Nausea And Vomiting  . Other     Seafood   . Penicillins Swelling    Has patient had a PCN reaction causing immediate rash, facial/tongue/throat swelling, SOB or lightheadedness with hypotension: Yes Has patient had a PCN reaction causing severe rash involving mucus membranes or skin necrosis: No Has patient had a PCN reaction that required hospitalization No Has patient had a PCN reaction occurring within the last 10 years: No If all of the above answers are "NO", then may proceed with Cephalosporin use.   . Tiotropium Bromide Monohydrate Itching  . Tramadol Nausea And Vomiting     PHYSICAL EXAM:  ECOG Performance status: 1-2 - Symptomatic, but largely independent.   Vitals:   12/02/16 1500  BP: 132/88  Pulse: 85  Resp: 20  Temp: 97.8 F (36.6 C)  SpO2: 96%   Filed Weights   12/02/16 1500  Weight: 139 lb 3.2 oz (63.1 kg)    Physical Exam  Constitutional: She is oriented to person, place, and time.  Unkept, chronically-ill appearing female in no acute distress. -Her lack of personal hygiene  and associated odor has chronically been a problem for her.  -Exam done with patient seated in wheelchair   HENT:  Head: Normocephalic.  Mild mucositis and xerostomia   Eyes: Pupils are equal, round, and reactive to light. Conjunctivae are normal. No scleral icterus.  Neck: Normal range of motion. Neck supple.  Cardiovascular: Normal rate and regular rhythm.   Pulmonary/Chest: Effort normal. No respiratory distress. She has wheezes.  Course rhonchi noted throughout all lung fields. Also wheezes appreciated as well.   Abdominal: Soft. Bowel sounds are normal. There is no tenderness.  Musculoskeletal: Normal range of motion. She exhibits edema (BLE edema; right foot > left foot.  ).  Lymphadenopathy:    She has no cervical adenopathy.  Neurological: She is alert and oriented to person, place, and time. No cranial nerve deficit.  Skin: Skin is warm and dry. There is pallor.  Psychiatric: Mood, memory, affect and judgment normal.  Nursing note and vitals reviewed.    LABORATORY DATA:  I have reviewed the labs as listed.  CBC    Component Value Date/Time   WBC 8.1 11/14/2016 0519   RBC 3.57 (L) 11/14/2016 0519   HGB 9.0 (L) 11/14/2016 0519   HCT 28.9 (L) 11/14/2016 0519   PLT 266 11/14/2016 0519   MCV 81.0 11/14/2016 0519   MCH 25.2 (L) 11/14/2016 0519   MCHC 31.1 11/14/2016 0519   RDW 17.8 (H) 11/14/2016 0519   LYMPHSABS 0.8 11/12/2016 0417   MONOABS 0.2 11/12/2016 0417   EOSABS 0.0 11/12/2016 0417   BASOSABS 0.0 11/12/2016 0417  CMP Latest Ref Rng & Units 11/15/2016 11/14/2016 11/13/2016  Glucose 65 - 99 mg/dL 153(H) 188(H) 155(H)  BUN 6 - 20 mg/dL 50(H) 55(H) 36(H)  Creatinine 0.44 - 1.00 mg/dL 1.29(H) 1.80(H) 1.34(H)  Sodium 135 - 145 mmol/L 132(L) 129(L) 132(L)  Potassium 3.5 - 5.1 mmol/L 4.4 4.0 4.1  Chloride 101 - 111 mmol/L 97(L) 93(L) 95(L)  CO2 22 - 32 mmol/L _0 Calcium 8.9 - 10.3 mg/dL 7.6(L) 7.9(L) 8.1(L)  Total Protein 6.5 - 8.1 g/dL - - -  Total  Bilirubin 0.3 - 1.2 mg/dL - - -  Alkaline Phos 38 - 126 U/L - - -  AST 15 - 41 U/L - - -  ALT 14 - 54 U/L - - -      PENDING LABS:     DIAGNOSTIC IMAGING:  CT chest/abd/pelvis: 11/27/16 CLINICAL DATA:  Pancreatic adenocarcinoma diagnosed October 2017, presenting for restaging.  EXAM: CT CHEST, ABDOMEN, AND PELVIS WITH CONTRAST  TECHNIQUE: Multidetector CT imaging of the chest, abdomen and pelvis was performed following the standard protocol during bolus administration of intravenous contrast.  CONTRAST:  165m ISOVUE-300 IOPAMIDOL (ISOVUE-300) INJECTION 61%  COMPARISON:  11/11/2016 unenhanced CT abdomen/ pelvis. 07/31/2016 CT abdomen. 03/12/2016 MRI abdomen. 12/24/2015 chest CT angiogram.  FINDINGS: CT CHEST FINDINGS  Cardiovascular: Mild cardiomegaly. No significant pericardial fluid/thickening. Right internal jugular MediPort terminates at the cavoatrial junction. Atherosclerotic nonaneurysmal thoracic aorta. Normal caliber pulmonary arteries. No central pulmonary emboli.  Mediastinum/Nodes: No discrete thyroid nodules. Unremarkable esophagus. No axillary adenopathy. Mildly enlarged 1.0 cm right paratracheal node (series 3/ image 21), previously 1.0 cm on 12/24/2015, stable. No additional pathologically enlarged mediastinal nodes. No hilar adenopathy.  Lungs/Pleura: No pneumothorax. Small dependent bilateral pleural effusions, decreased bilaterally since 11/11/2016 CT. Moderate centrilobular and paraseptal emphysema with diffuse bronchial wall thickening. Nodular 2.0 x 1.4 cm focus of consolidation in the basilar right lower lobe (series 5/ image 118), previously 2.6 x 1.9 cm on 11/11/2016, decreased. Bandlike consolidation in the medial basilar left lower lobe (series 5/ image 110), decreased since 11/11/2016. Mild interlobular septal thickening at the lung bases. Mild patchy tree-in-bud opacities in the dependent left lower lobe (series 5/image 72)  compatible with mild nonspecific bronchiolitis. No acute consolidative airspace disease, lung masses or additional significant pulmonary nodules.  Musculoskeletal: No aggressive appearing focal osseous lesions. Mild thoracic spondylosis. Partially visualized surgical hardware from ACDF in the lower cervical spine.  CT ABDOMEN PELVIS FINDINGS  Hepatobiliary: Simple 6.5 cm segment 3 left liver lobe cyst. Several scattered subcentimeter hypodense lesions throughout the liver are too small to characterize and not appreciably changed since 03/12/2016 MRI, where they were characterized as benign cysts and hamartomas. No appreciable new liver lesions. Normal gallbladder with no radiopaque cholelithiasis. No biliary ductal dilatation.  Pancreas: There is a hypoenhancing 4.0 x 2.3 cm distal pancreatic body mass (series 3/image 61), previously 3.6 x 1.8 cm on 07/31/2016 using similar measurement technique, increased in size. There is stable atrophy and ductal dilatation throughout the pancreatic tail.  Spleen: Normal size. No mass.  Adrenals/Urinary Tract: Normal adrenals. No hydronephrosis. Simple 1.5 cm posterior lower right renal cyst. Normal bladder.  Stomach/Bowel: Grossly normal stomach. Normal caliber small bowel with no small bowel wall thickening. Normal appendix. Normal large bowel with no diverticulosis, large bowel wall thickening or pericolonic fat stranding.  Vascular/Lymphatic: Atherosclerotic nonaneurysmal abdominal aorta. Patent hepatic, portal and renal veins. Stable mild extrinsic narrowing of the splenic vein, which remains patent. No pathologically enlarged lymph nodes in  the abdomen or pelvis.  Reproductive: Grossly normal uterus.  No adnexal mass.  Other: No pneumoperitoneum, ascites or focal fluid collection.  Musculoskeletal: No aggressive appearing focal osseous lesions. Marked lumbar spondylosis.  IMPRESSION: 1. Distal pancreatic body neoplasm  has mildly increased in size since 07/31/2016 CT abdomen study. Stable atrophy and ductal dilatation throughout the pancreatic tail. 2. No findings suspicious for metastatic disease in the chest, abdomen or pelvis. 3. Nodular focus of consolidation in the basilar right lower lobe and bandlike consolidation in the medial basilar left lower lobe are decreased in size since 11/11/2016 CT abdomen study, most suggestive of resolving infection. Small dependent bilateral pleural effusions are decreased bilaterally since 11/11/2016 CT . 4. Mild right paratracheal mediastinal lymphadenopathy is stable since 12/24/2015 chest CT, more suggestive of benign reactive adenopathy. 5. Mild cardiomegaly. Mild interlobular septal thickening at the lung bases may indicate mild pulmonary edema. 6. Aortic Atherosclerosis (ICD10-I70.0) and Emphysema (ICD10-J43.9).   Electronically Signed   By: Ilona Sorrel M.D.   On: 11/28/2016 08:03       PATHOLOGY:  Pancreas FNA: 11/29/15          ASSESSMENT & PLAN:   Stage IB (T2N0M0) adenocarcinoma of pancreas: -CA 19-9 elevated at time of diagnosis; there was steady decline in tumor markers with chemotherapy.  Last chemo was Gemzar/Abraxane on 05/15/16. Most recent CA 19-9 markedly elevated at 369 on 10/30/16; prior to this CA 19-9 was 98 in 07/2016.   -Reviewed most recent restaging CT chest/abd/pelvis from 11/27/16; currently pancreatic mass is only minimally larger than previous imaging.  No findings suspicious for metastatic disease, which is reassuring.   -It has been very challenging to treat Ms. Deneault with more standard treatment approaches.  She has received 6 cycles of chemotherapy with Gemzar/Abraxane from 12/19/15-05/15/16. She was referred to Dr. Barry Dienes in [place] for consideration for surgical resection. However, it has been very difficult to obtain pulmonary and cardiology clearance d/t Ms. Miles's frequent hospitalizations and chronic lung  disease.  Therefore, she is not an ideal surgical candidate.  Discussed with Dr. Talbert Cage.  It is difficult for [place] to continue to offer her any additional chemotherapy given her frequent hospitalizations and her non-adherence to coming to cancer center for follow-up visits. Some of her non-adherence is secondary to her frequent hospitalizations and some to her social situation.   I shared with her today our concerns re: her treatment plan.  We would like to send her for radiation oncology consultation to discuss possible local tumor control of her disease; we discussed that her disease may progress to become metastatic given marked increase in tumor marker, but thus far there is no definitive evidence of metastatic disease appreciated on CT imaging.  Discussed referral to radiation in [place].  Transportation is a challenge for her at times, and I have legitimate concerns that she will be compliant with radiation therapy if it is recommended for her.   However, our options are becoming increasingly limited for her.  She agrees to referral to rad onc in [place]. She will require additional support, particularly in terms of transportation assistance, should she be a candidate for radiation therapy.   -We will tentatively schedule to bring her back in 3 months for follow-up.  If radiation oncologist does not feel that radiation is appropriate for her, then we will plan to see her sooner for follow-up.  Would ask that they let us know if they cannot treat her.     RLE DVT  and cellulitis:  -Continue Xarelto.    Pain:  -Being managed by outside provider as we had to stop providing pain medications for her d/t non-adherence to her treatment and multiple hospitalizations.  Also, given that her pain is not likely secondary to her cancer, it is best managed by another provider.  She shares that she has challenges obtaining medications for pain. While we are certainly sympathetic to her situation, we do not feel  comfortable continuing to treat her pain at this time.  If she is able to show consistency in coming for her follow-up visits and/or we deem that her pain is secondary to her malignancy, then we could consider resuming control of her pain medications.  However, will defer any pain management to her PCP or pain management at this time.  She understands our rationale with this decision.          Dispo:  -Refer to rad onc in [place] for consultation.  -Return to cancer center in 3 months for follow-up; continue port flush every 2 months.    All questions were answered to patient's stated satisfaction. Encouraged patient to call with any new concerns or questions before her next visit to the cancer center and we can certain see her sooner, if needed.    Plan of care discussed with Dr. Talbert Cage, who agrees with the above aforementioned.     Orders placed this encounter:  No orders of the defined types were placed in this encounter.     Mike Craze, NP [place] [phone]

## 2016-12-02 NOTE — Patient Instructions (Signed)
[place] at [facility] Discharge Instructions  RECOMMENDATIONS MADE BY THE CONSULTANT AND ANY TEST RESULTS WILL BE SENT TO YOUR REFERRING PHYSICIAN.  You were seen today by Mike Craze, NP We will get you referred to radiation to treat your abdominal mass See schedulers up front for appointments   Thank you for choosing [place] at [facility] to provide your oncology and hematology care.  To afford each patient quality time with our provider, please arrive at least 15 minutes before your scheduled appointment time.    If you have a lab appointment with the [facility] please come in thru the  Main Entrance and check in at the main information desk  You need to re-schedule your appointment should you arrive 10 or more minutes late.  We strive to give you quality time with our providers, and arriving late affects you and other patients whose appointments are after yours.  Also, if you no show three or more times for appointments you may be dismissed from the clinic at the providers discretion.     Again, thank you for choosing [facility].  Our hope is that these requests will decrease the amount of time that you wait before being seen by our physicians.       _____________________________________________________________  Should you have questions after your visit to [facility], please contact our office at [phone] between the hours of 8:30 a.m. and 4:30 p.m.  Voicemails left after 4:30 p.m. will not be returned until the following business day.  For prescription refill requests, have your pharmacy contact our office.       Resources For Cancer Patients and their Caregivers ? American Cancer Society: Can assist with transportation, wigs, general needs, runs Look Good Feel Better.        480-538-1154 ? Cancer Care: Provides financial assistance, online support groups, medication/co-pay assistance.   1-800-813-HOPE (878) 087-9038) ? [place] Assists [place] cancer patients and their families through emotional , educational and financial support.  216-593-2168 ? [place] DSS Where to apply for food stamps, Medicaid and utility assistance. 252-043-8526 ? [facility]: Transportation to medical appointments. 774-118-7288 ? Social Security Administration: May apply for disability if have a Stage IV cancer. (443) 072-1852 (217) 794-1253 ? [employer], Disability and Transit Services: Assists with nutrition, care and transit needs. [place] Support Programs: @10RELATIVEDAYS @ > Cancer Support Group  2nd Tuesday of the month 1pm-2pm, Journey Room  > Creative Journey  3rd Tuesday of the month 1130am-1pm, Journey Room  > Look Good Feel Better  1st Wednesday of the month 10am-12 noon, Journey Room (Call [place] to register [id number])

## 2016-12-03 ENCOUNTER — Encounter (HOSPITAL_COMMUNITY): Payer: Self-pay | Admitting: Lab

## 2016-12-03 NOTE — Progress Notes (Unsigned)
Referral sent to [facility].  Records faxed on 10/31.

## 2017-01-15 ENCOUNTER — Encounter (HOSPITAL_COMMUNITY): Payer: Self-pay

## 2017-01-22 ENCOUNTER — Encounter (HOSPITAL_COMMUNITY): Payer: Self-pay

## 2017-01-22 ENCOUNTER — Ambulatory Visit (HOSPITAL_COMMUNITY): Payer: Self-pay

## 2017-01-26 ENCOUNTER — Other Ambulatory Visit (HOSPITAL_COMMUNITY): Payer: Self-pay | Admitting: Hematology & Oncology

## 2017-01-26 DIAGNOSIS — C251 Malignant neoplasm of body of pancreas: Secondary | ICD-10-CM

## 2017-03-04 ENCOUNTER — Ambulatory Visit (HOSPITAL_COMMUNITY): Payer: Self-pay | Admitting: Internal Medicine

## 2017-05-04 DEATH — deceased

## 2017-08-14 IMAGING — DX DG CHEST 2V
2 series · 2 of 2 positions shown · non-contrast
Comparison: 02/07/2016

CLINICAL DATA: Chest pain, abdominal pain, shortness of breath

EXAM:
CHEST  2 VIEW

[chest pa]
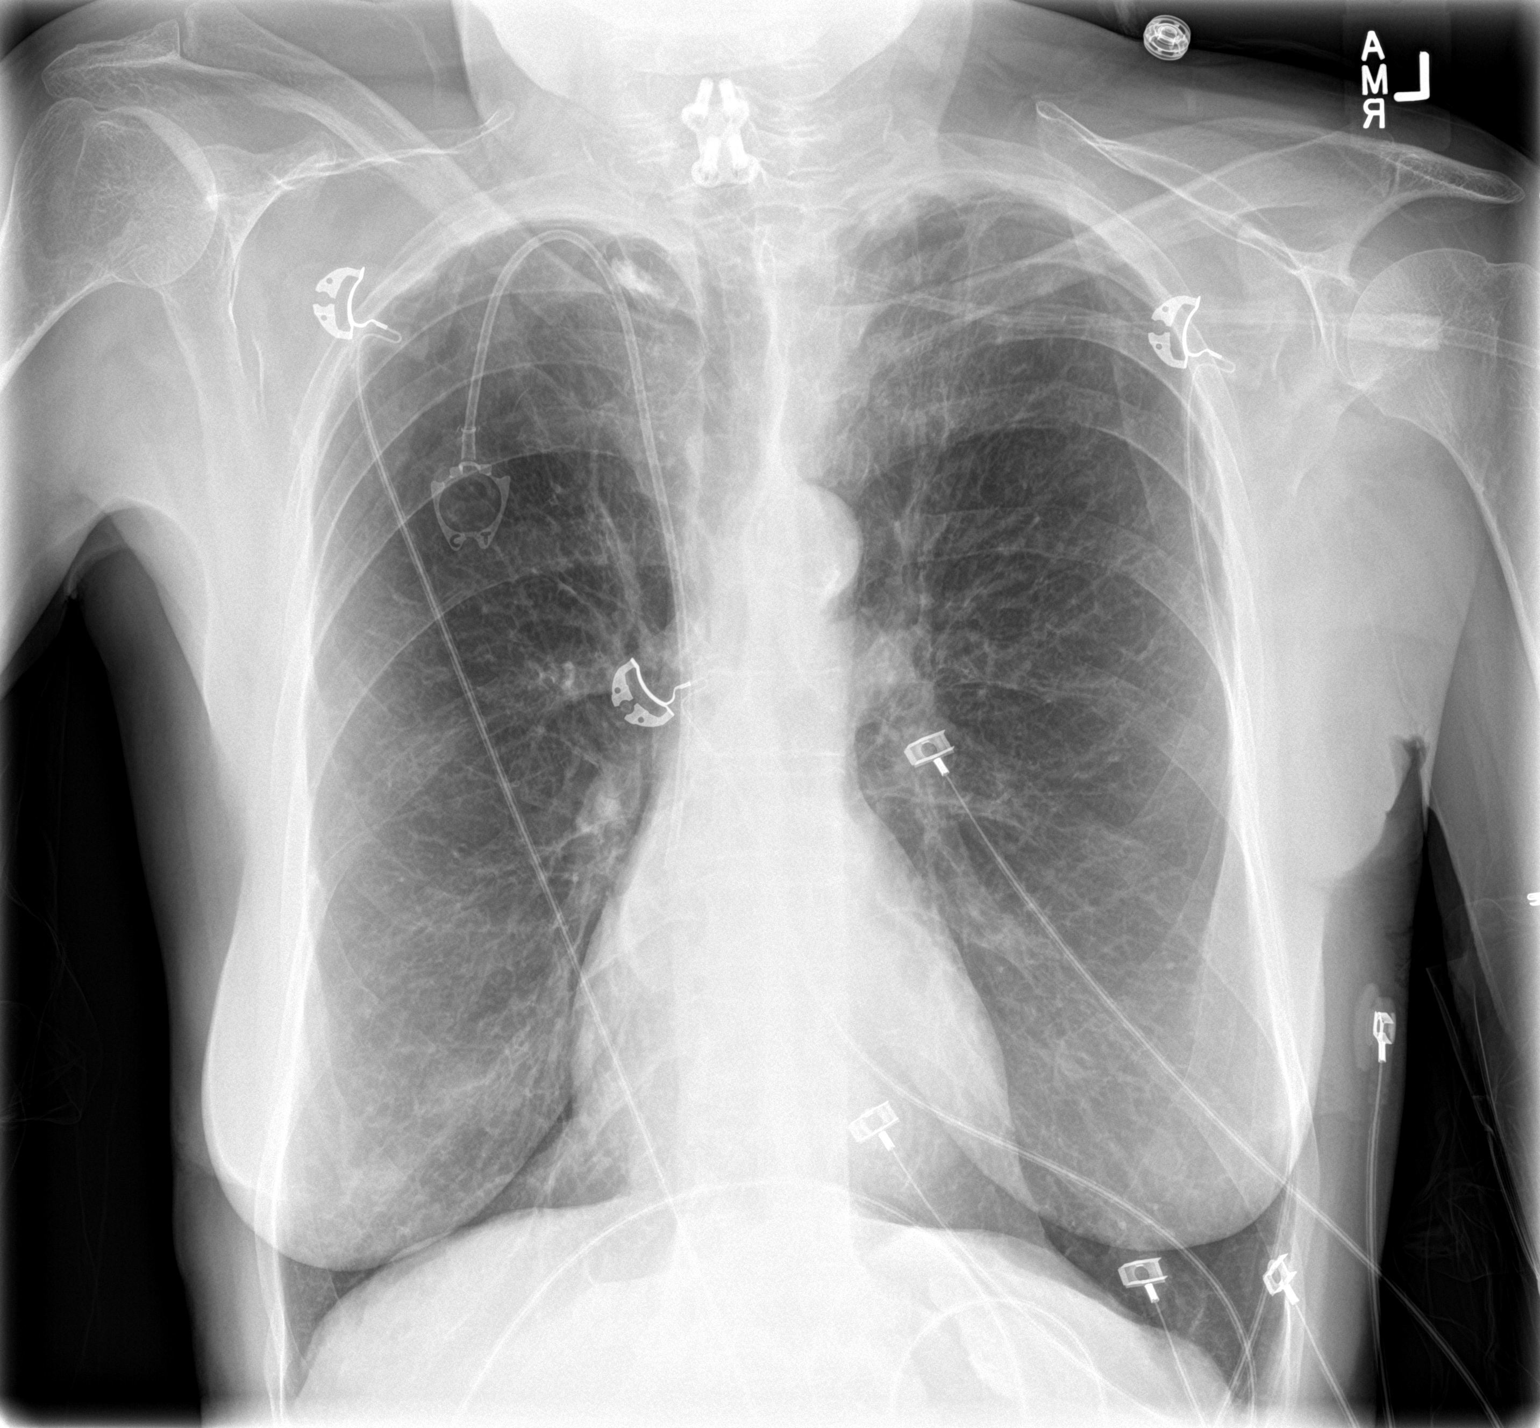

[chest lat]
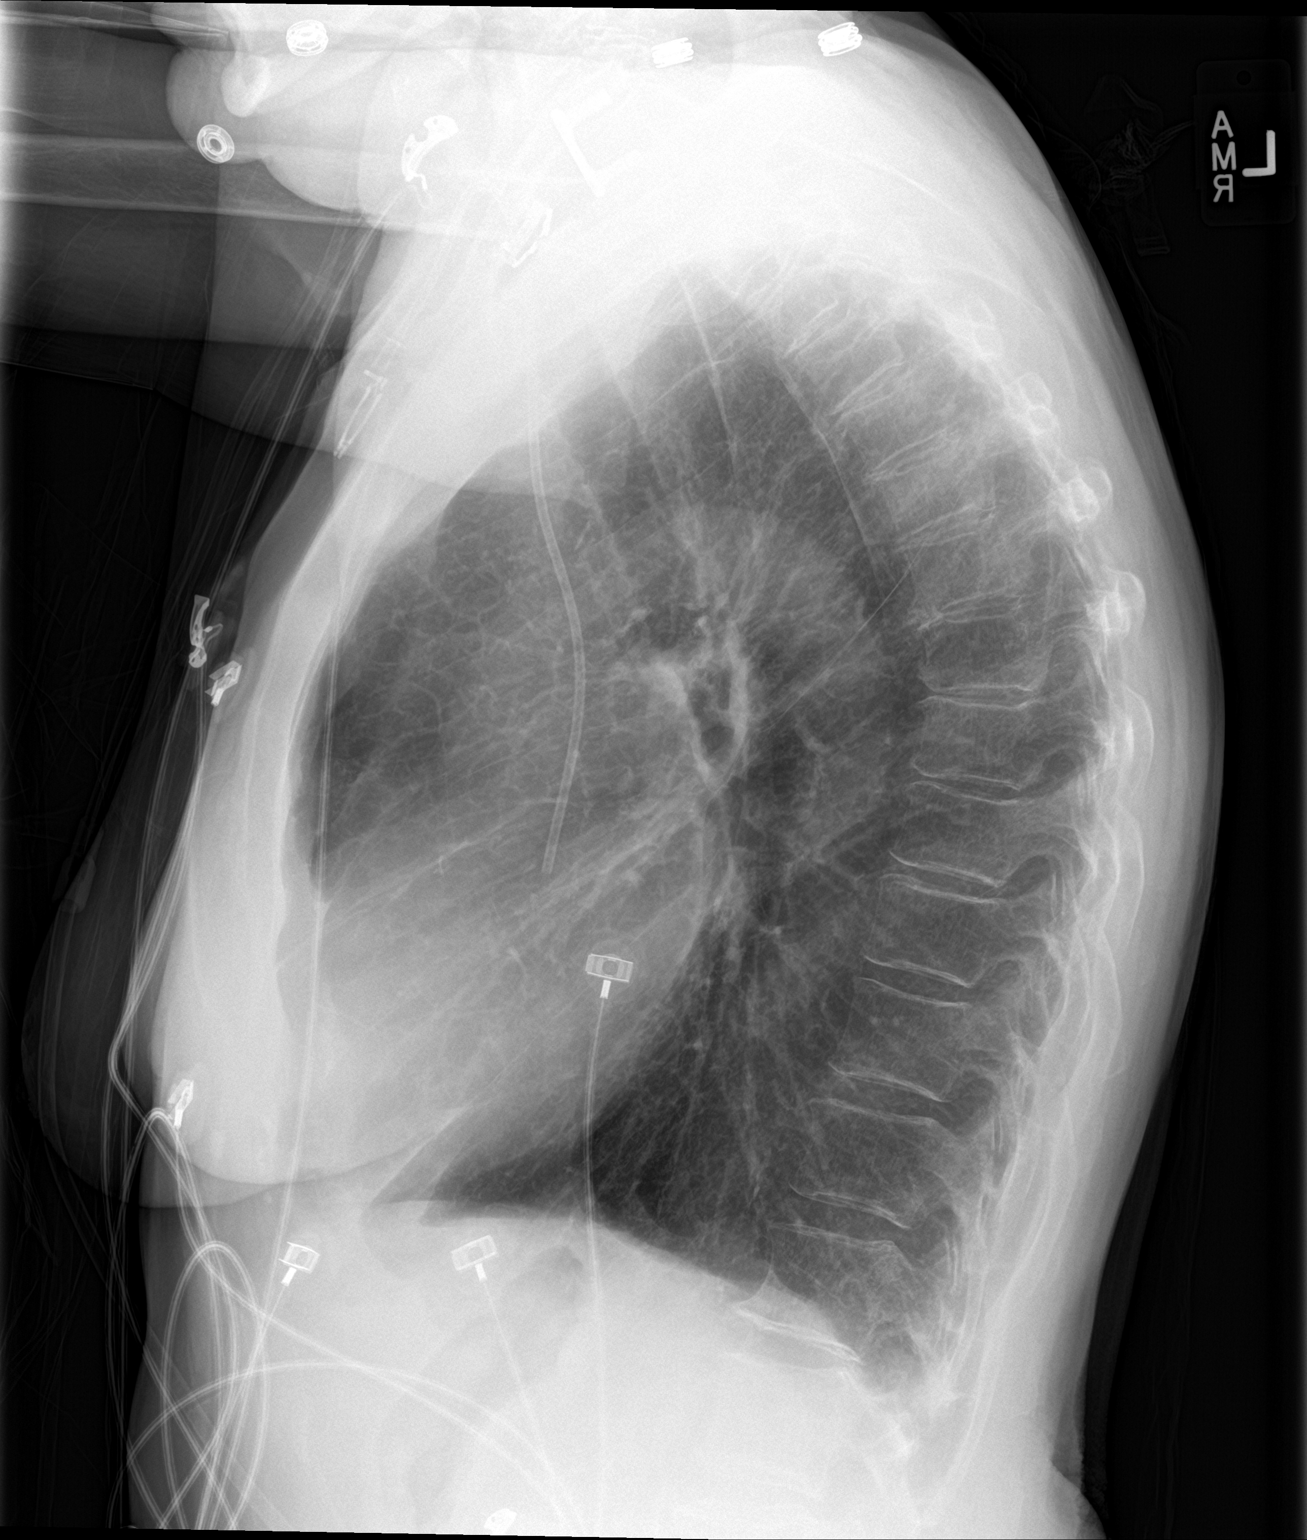

[2 of 2 positions shown; findings below may reference images not displayed]

FINDINGS: There is a right-sided Port-A-Cath in satisfactory position. The
lungs are hyperinflated likely secondary to COPD. There is mild
biapical pleural thickening. There is no focal parenchymal opacity.
There is no pleural effusion or pneumothorax. The heart and
mediastinal contours are unremarkable.

The osseous structures are unremarkable.
IMPRESSION: No active cardiopulmonary disease.

## 2017-10-28 IMAGING — CR DG FOOT 2V*R*
1 series · 2 of 2 positions shown · non-contrast
Comparison: None.

CLINICAL DATA: 65-year-old female with a history of redness
swelling and pain no injury.

EXAM:
RIGHT FOOT - 2 VIEW

[Series 2: ap · 0.17mm/px · 2 of 2 slices shown]
[im 1/2]
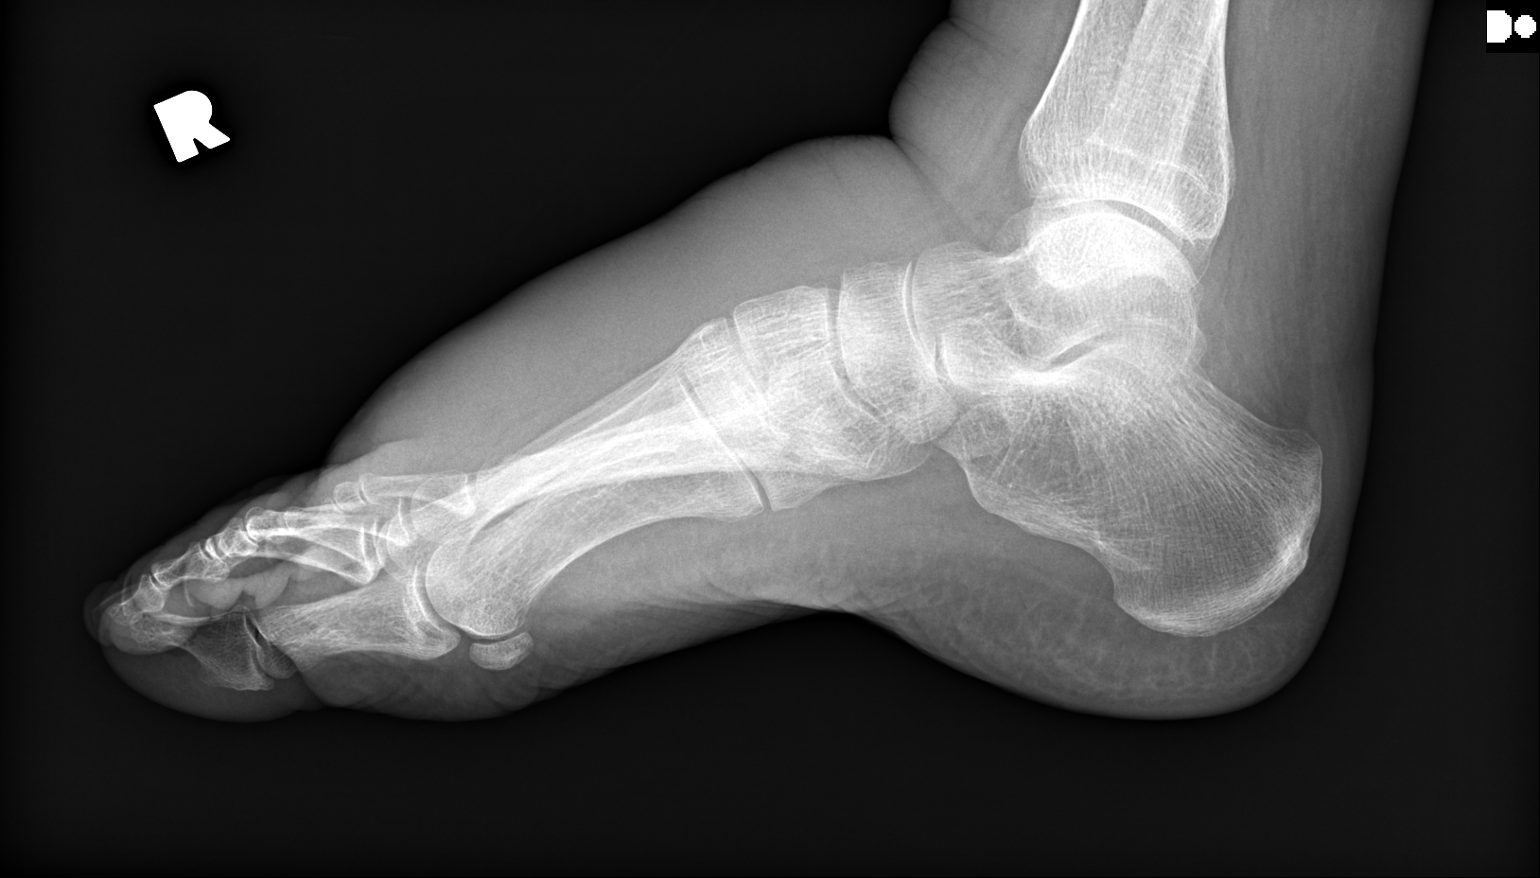
[im 2/2]
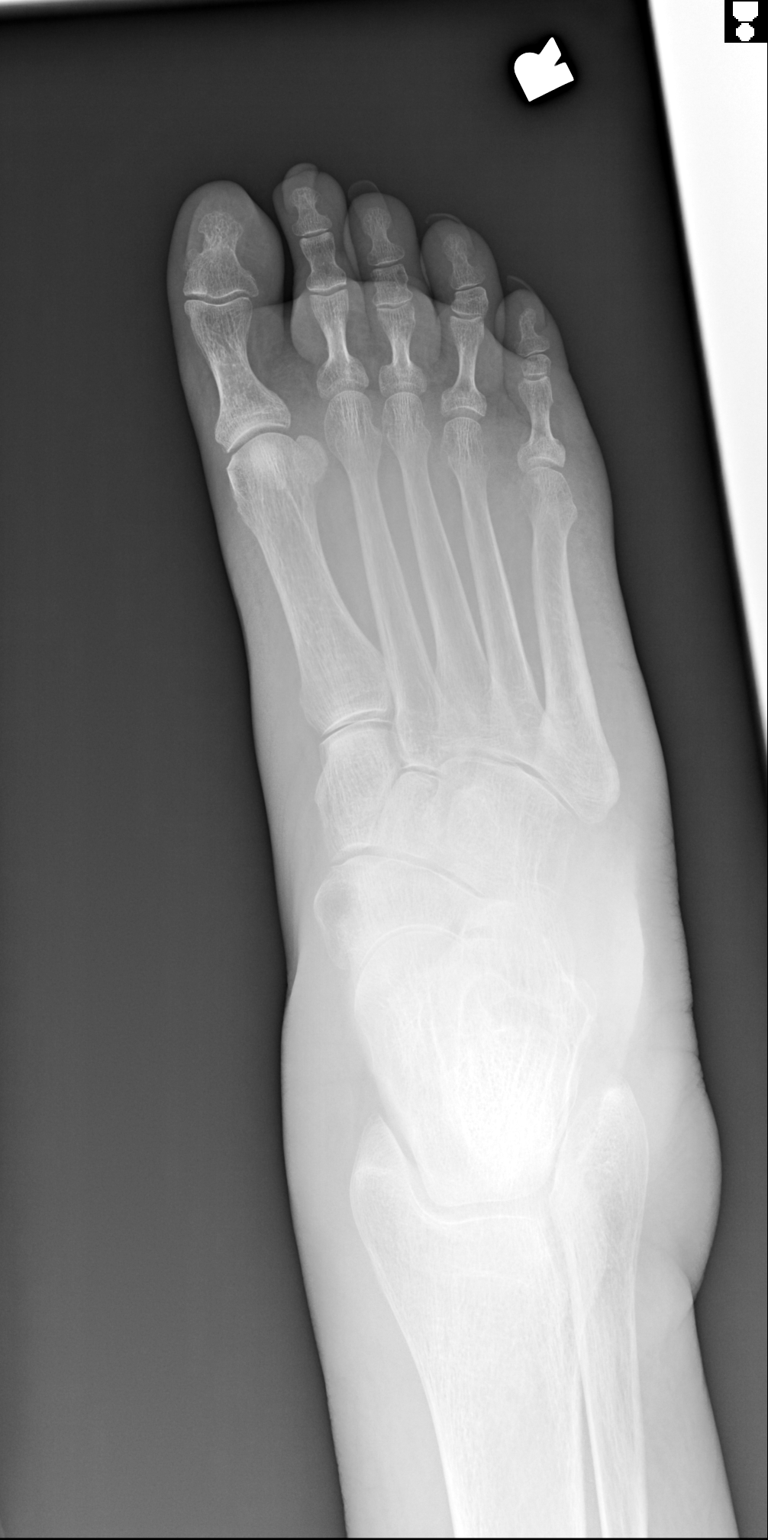

[2 of 2 positions shown; findings below may reference images not displayed]

FINDINGS: Pronounced swelling of the right foot and ankle, most evident on the
lateral view. No radiopaque foreign body.

No displaced fracture.

Mild degenerative changes of the forefoot, midfoot, hindfoot.
IMPRESSION: Nonspecific soft tissue swelling of the right foot and ankle.

No acute fracture identified.

## 2018-03-05 ENCOUNTER — Encounter (HOSPITAL_COMMUNITY): Payer: Self-pay | Admitting: Oncology

## 2018-03-05 ENCOUNTER — Other Ambulatory Visit: Payer: Self-pay | Admitting: Nurse Practitioner

## 2018-03-06 IMAGING — CT CT HEAD W/O CM
3 series · 16 of 47 positions shown, 19 images · non-contrast
Comparison: None.

CLINICAL DATA: 66-year-old hypertensive female fell off couch well
sleeping. Having dizzy spells. Initial encounter.

EXAM:
CT HEAD WITHOUT CONTRAST
TECHNIQUE: Contiguous axial images were obtained from the base of the skull
through the vertex without intravenous contrast.

[Series 2: head trauma wo · axial · 0.42mm/px · z∈[+127,+252]mm · 10 of 30 slices shown, 13 images]
[im 3/30  brain]
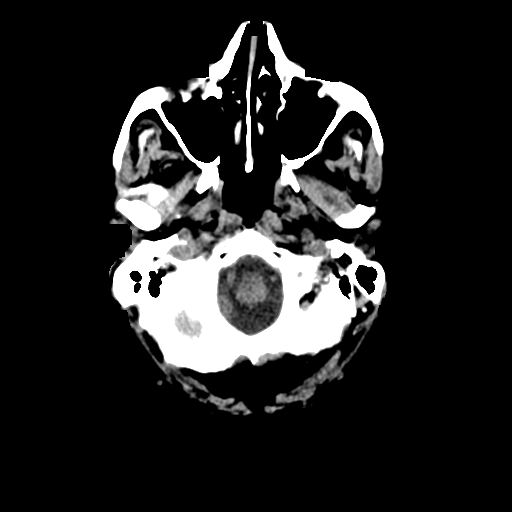
[im 3/30  bone]
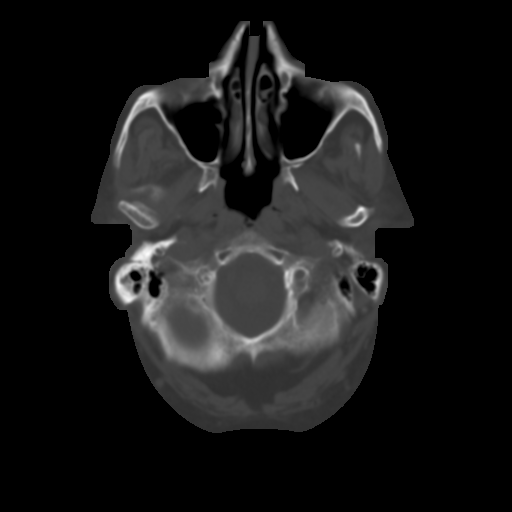
[im 6/30  brain]
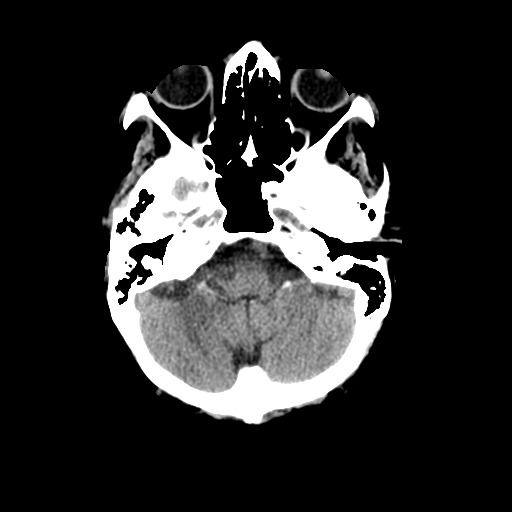
[im 9/30  brain]
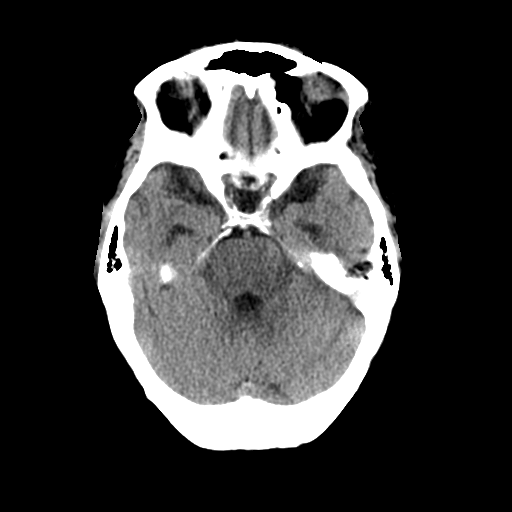
[im 11/30  brain]
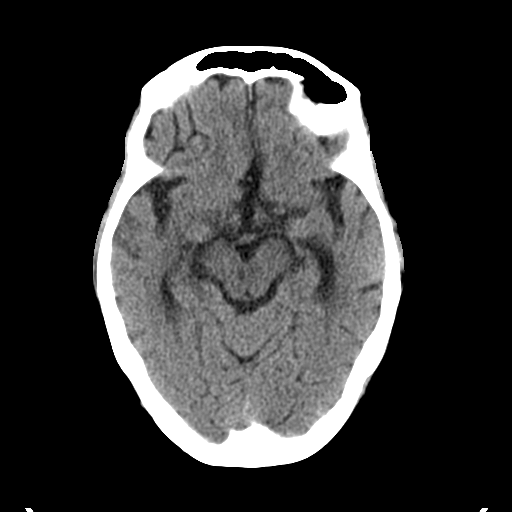
[im 14/30  brain]
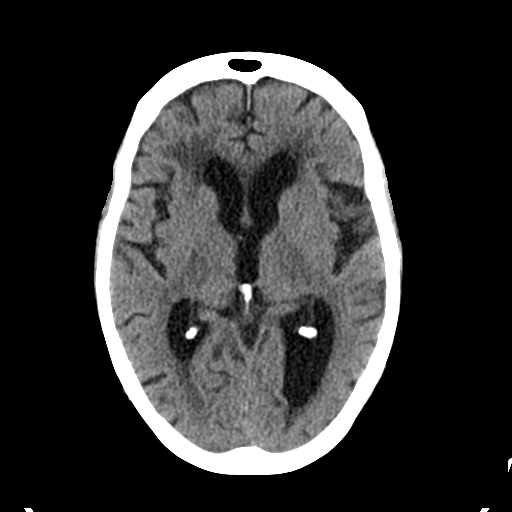
[im 14/30  bone]
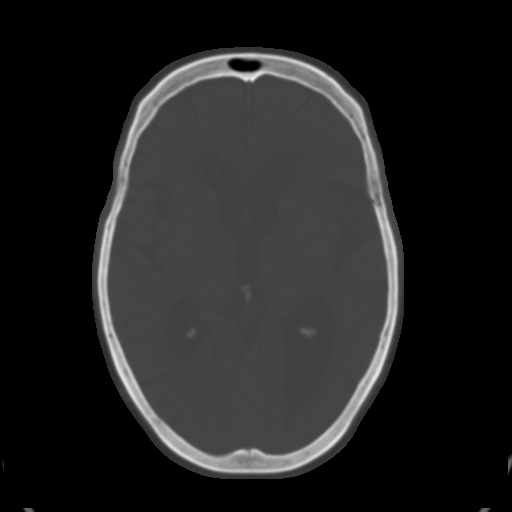
[im 17/30  brain]
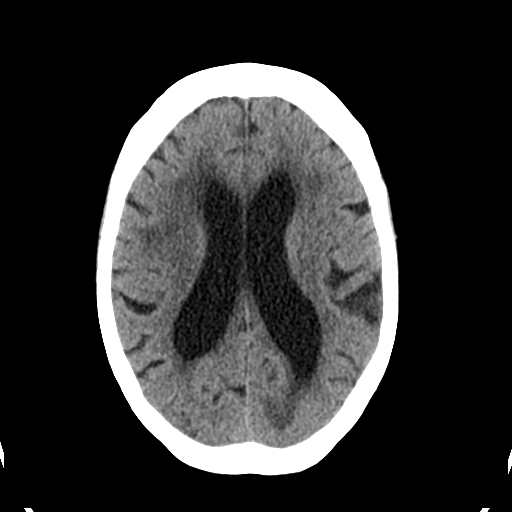
[im 20/30  brain]
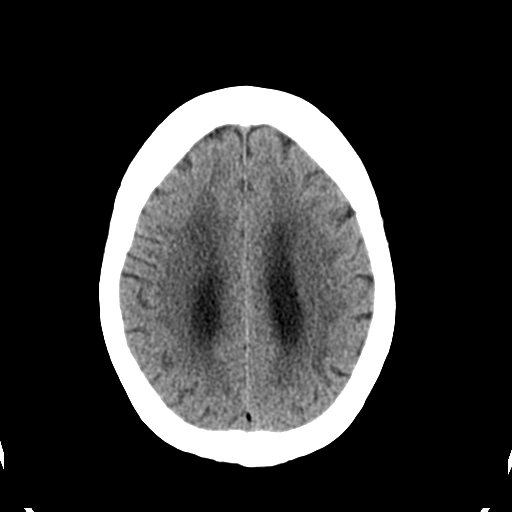
[im 23/30  brain]
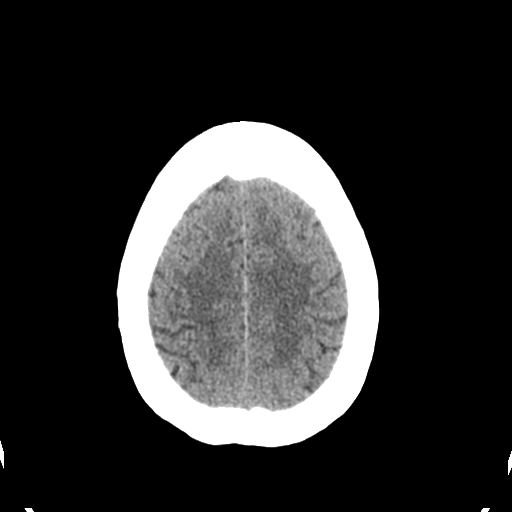
[im 25/30  brain]
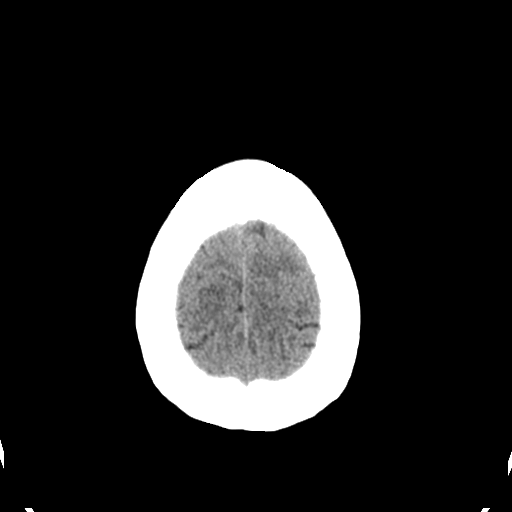
[im 25/30  bone]
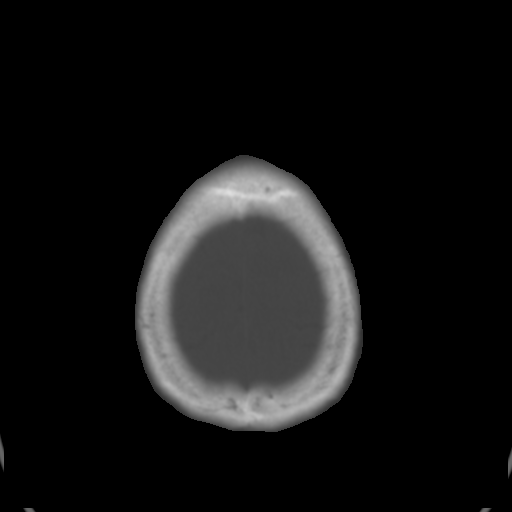
[im 28/30  brain]
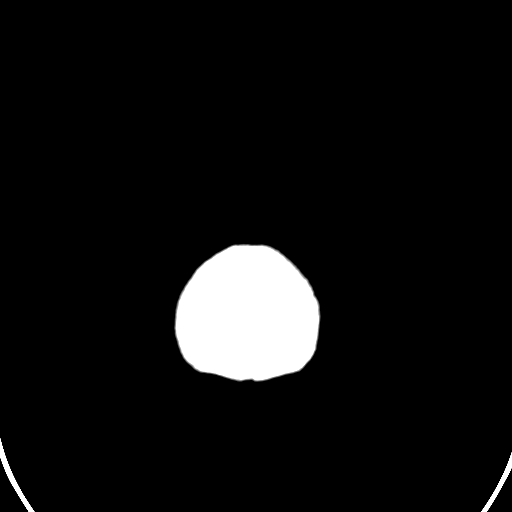

[Series 4: coronal soft tissue · coronal · 0.30mm/px · 3 of 62 slices shown]
[im 21/62  brain]
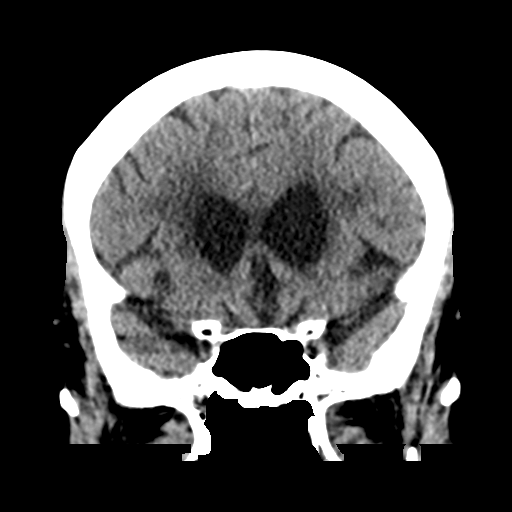
[im 28/62  brain]
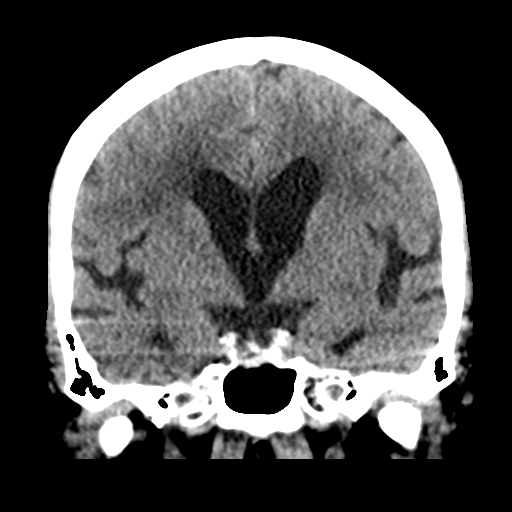
[im 34/62  brain]
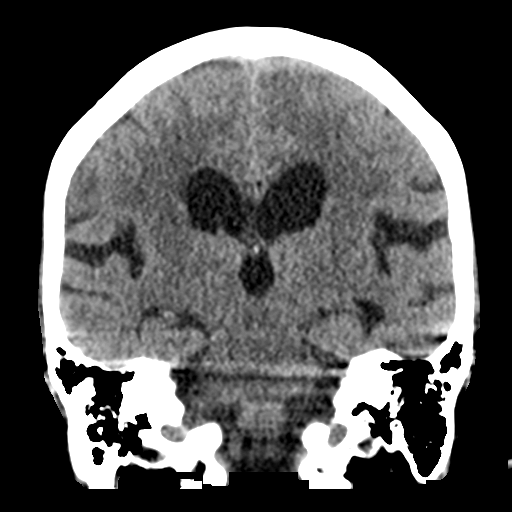

[Series 5: sagittal soft tissue · sagittal · 0.31mm/px · 3 of 48 slices shown]
[im 16/48  brain]
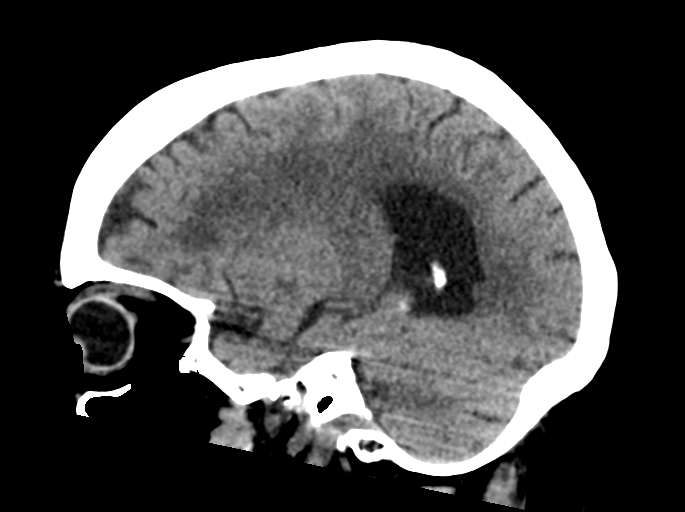
[im 24/48  brain]
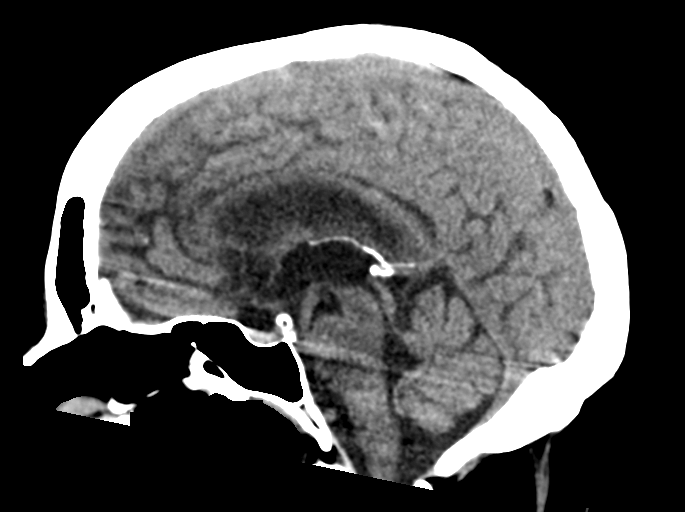
[im 32/48  brain]
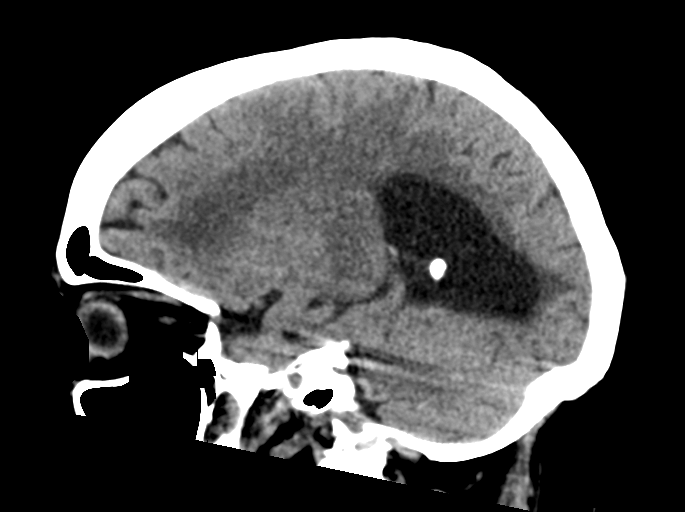

[16 of 47 positions shown; findings below may reference images not displayed]

FINDINGS: Brain: No intracranial hemorrhage or CT evidence of large acute
infarct.

Prominent chronic microvascular changes.

Global atrophy. Ventricular prominence probably related to central
atrophy rather hydrocephalus.

No intracranial mass lesion noted on this unenhanced exam.

Vascular: Vascular calcifications.

Skull: No skull fracture.

Sinuses/Orbits: No acute orbital abnormality. Visualized paranasal
sinuses are clear.

Other: Mastoid air cells and middle ear cavities are clear.
IMPRESSION: No intracranial hemorrhage or skull fracture.

Chronic microvascular changes.

Atrophy.

## 2018-11-28 IMAGING — US US EXTREM LOW VENOUS*R*
1 series · 13 of 24 positions shown · non-contrast
Comparison: None.

CLINICAL DATA: Right lower extremity edema.



[Series 1: us extrem low venous*right* · 0.08mm/px · 13 of 56 slices shown]
[im 1/56]
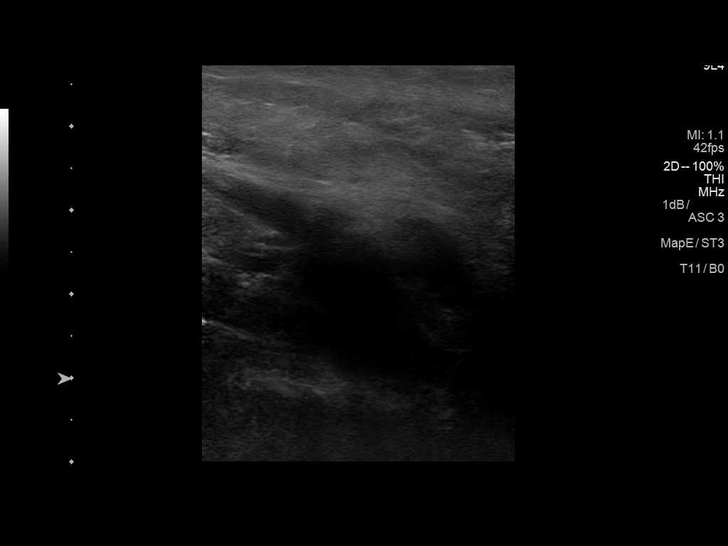
[im 5/56]
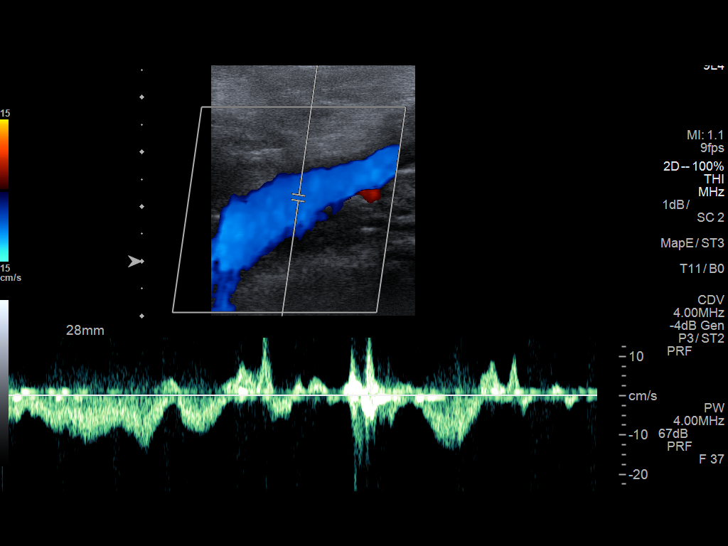
[im 10/56]
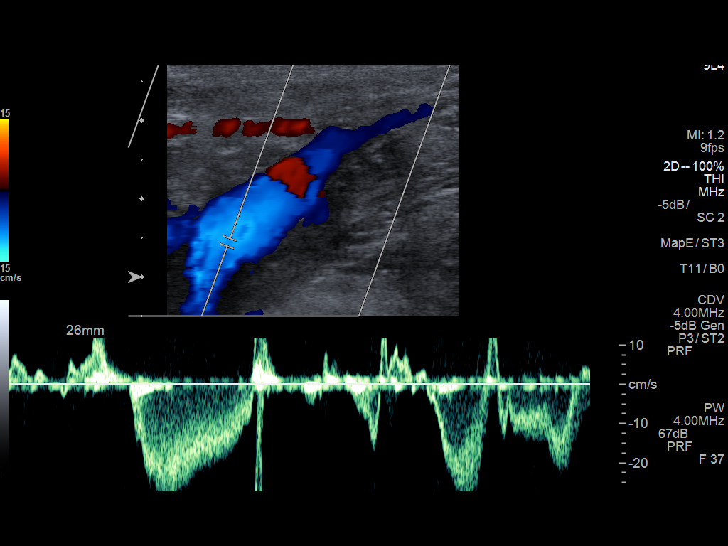
[im 15/56]
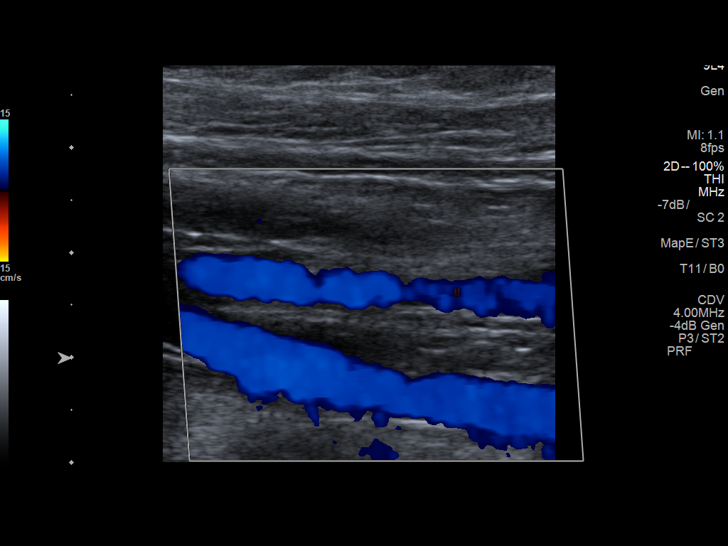
[im 20/56]
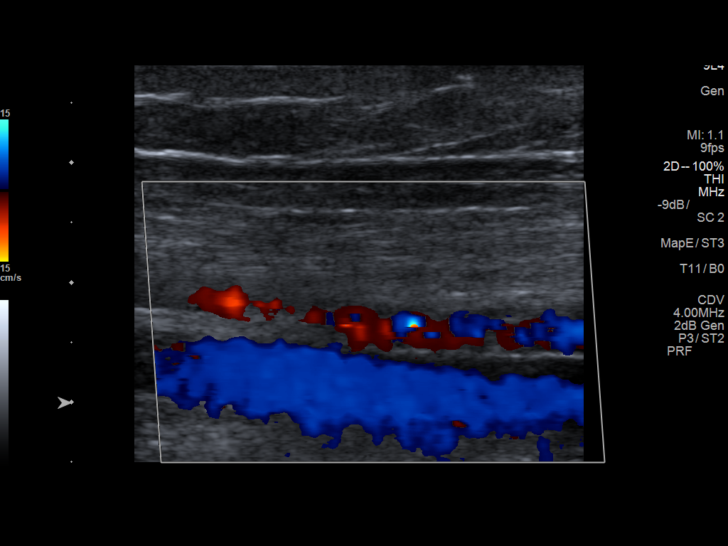
[im 24/56]
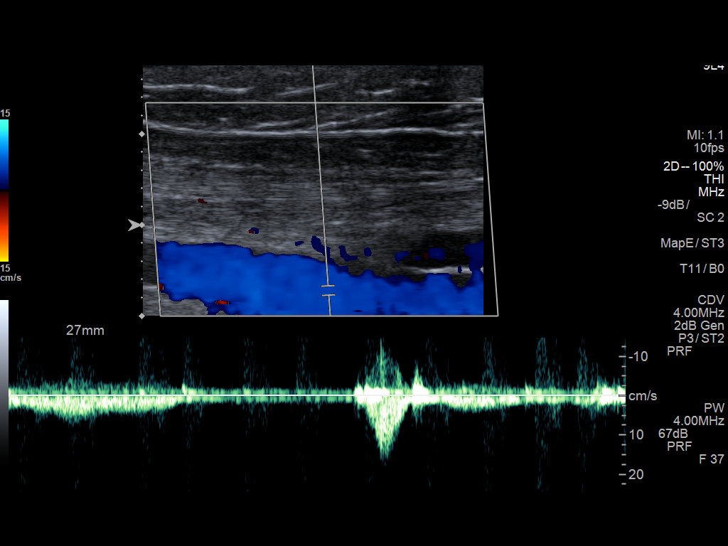
[im 29/56]
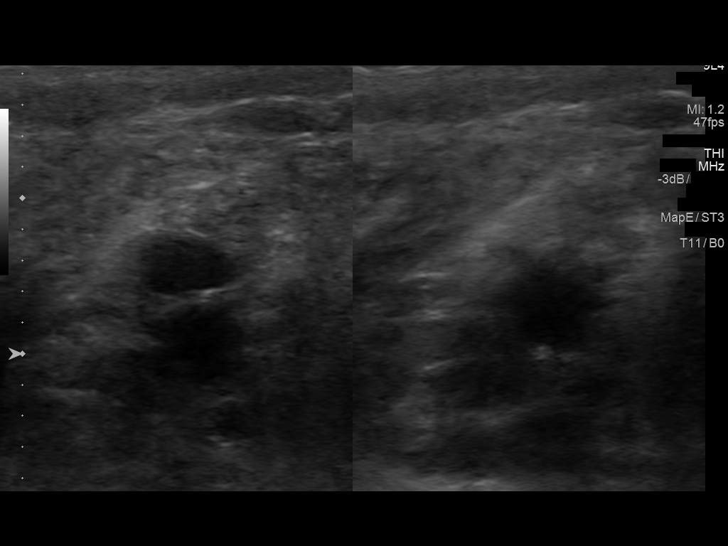
[im 32/56]
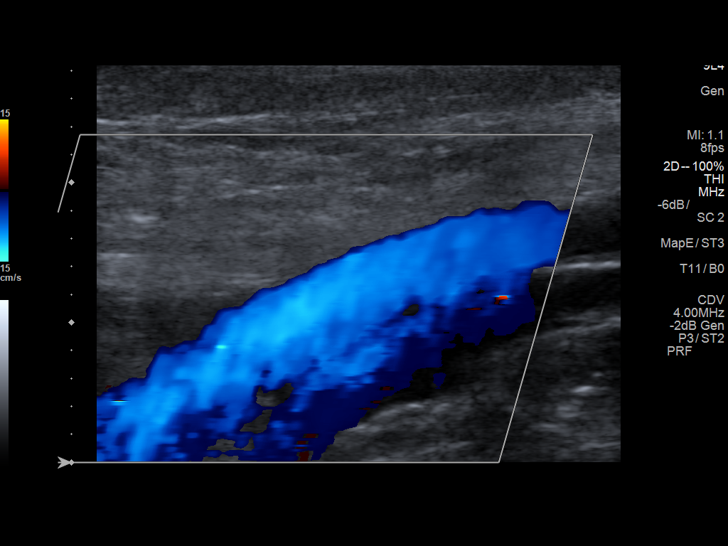
[im 36/56]
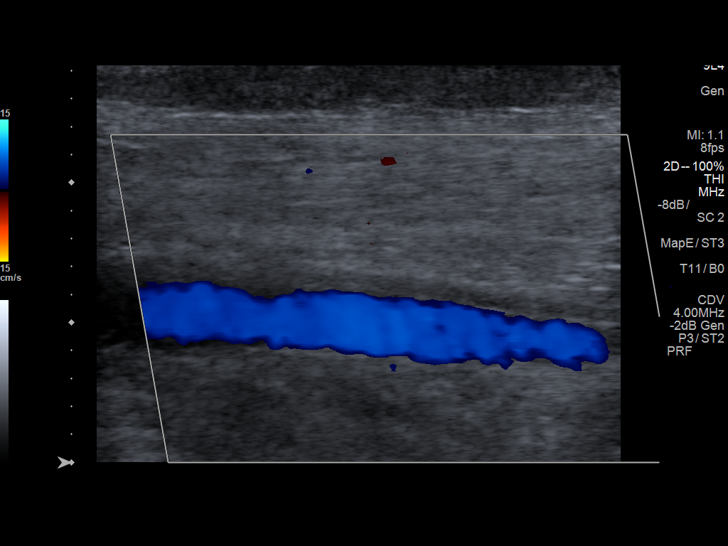
[im 41/56]
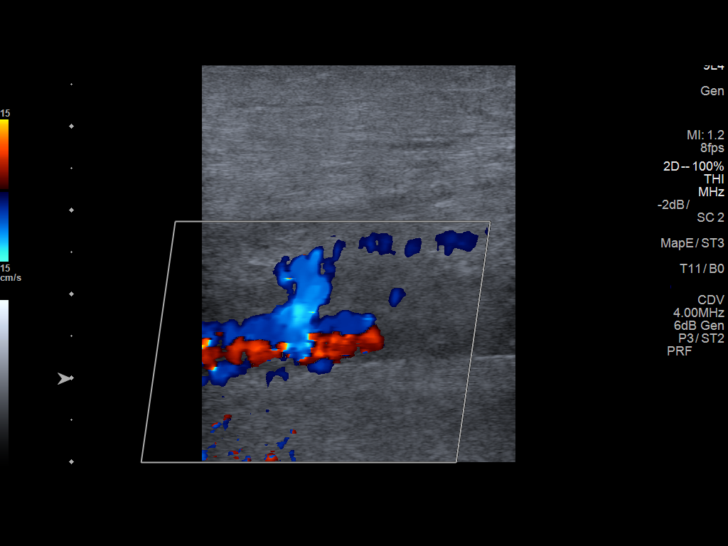
[im 46/56]
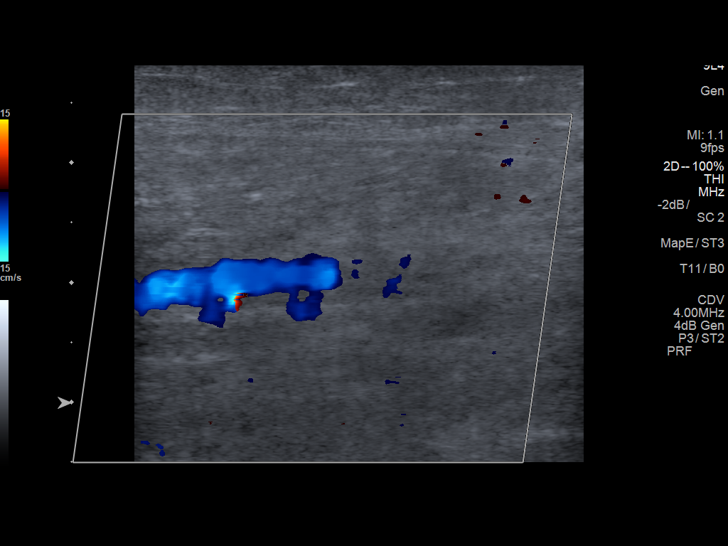
[im 51/56]
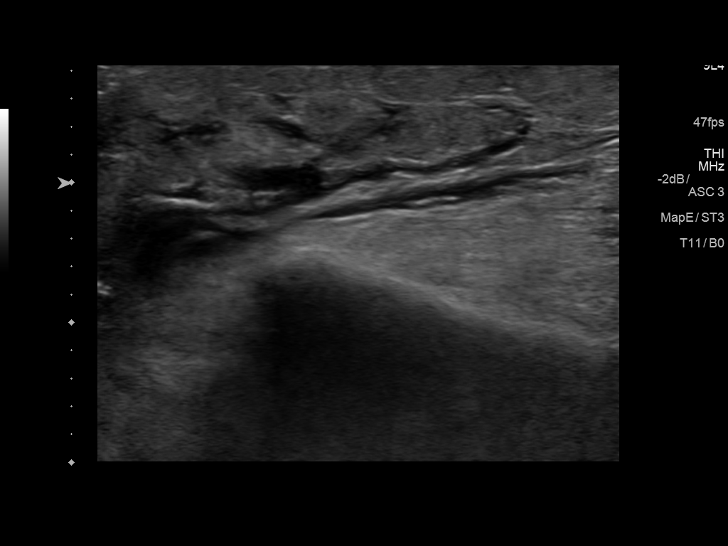
[im 56/56]
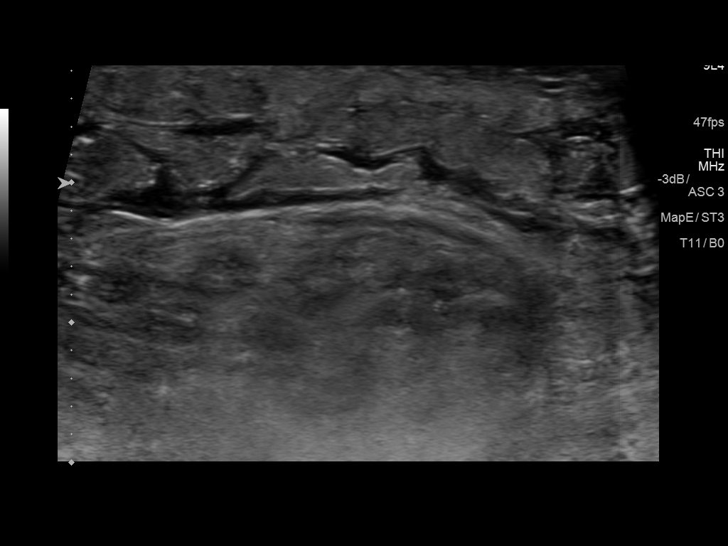

[13 of 24 positions shown; findings below may reference images not displayed]

FINDINGS: Contralateral Common Femoral Vein: Respiratory phasicity is normal
and symmetric with the symptomatic side. No evidence of thrombus.
Normal compressibility.

Common Femoral Vein: No evidence of thrombus. Normal
compressibility, respiratory phasicity and response to augmentation.

Saphenofemoral Junction: No evidence of thrombus. Normal
compressibility and flow on color Doppler imaging.

Profunda Femoral Vein: No evidence of thrombus. Normal
compressibility and flow on color Doppler imaging.

Femoral Vein: No evidence of thrombus. Normal compressibility,
respiratory phasicity and response to augmentation.

Popliteal Vein: No evidence of thrombus. Normal compressibility,
respiratory phasicity and response to augmentation.

Calf Veins: Limited evaluation secondary to soft tissue edema. No
evidence of thrombus.

Superficial Great Saphenous Vein: No evidence of thrombus. Normal
compressibility and flow on color Doppler imaging.

Venous Reflux:  None.

Other Findings:  None.
IMPRESSION: No evidence of deep venous thrombosis of the right lower extremity.
